# Patient Record
Sex: Female | Born: 1935 | Race: White | Hispanic: No | Smoking: Never smoker
Health system: Southern US, Community
[De-identification: ages and names within clinical notes are randomized; demographics above are authoritative.]

## PROBLEM LIST (undated history)

## (undated) DIAGNOSIS — I480 Paroxysmal atrial fibrillation: Secondary | ICD-10-CM

## (undated) DIAGNOSIS — I2510 Atherosclerotic heart disease of native coronary artery without angina pectoris: Secondary | ICD-10-CM

## (undated) DIAGNOSIS — E119 Type 2 diabetes mellitus without complications: Secondary | ICD-10-CM

## (undated) DIAGNOSIS — K219 Gastro-esophageal reflux disease without esophagitis: Secondary | ICD-10-CM

## (undated) DIAGNOSIS — E785 Hyperlipidemia, unspecified: Secondary | ICD-10-CM

## (undated) DIAGNOSIS — I441 Atrioventricular block, second degree: Secondary | ICD-10-CM

## (undated) DIAGNOSIS — I428 Other cardiomyopathies: Secondary | ICD-10-CM

## (undated) DIAGNOSIS — Z8701 Personal history of pneumonia (recurrent): Secondary | ICD-10-CM

## (undated) DIAGNOSIS — H3530 Unspecified macular degeneration: Secondary | ICD-10-CM

## (undated) DIAGNOSIS — N183 Chronic kidney disease, stage 3 unspecified: Secondary | ICD-10-CM

## (undated) DIAGNOSIS — I10 Essential (primary) hypertension: Secondary | ICD-10-CM

## (undated) HISTORY — DX: Chronic kidney disease, stage 3 unspecified: N18.30

## (undated) HISTORY — DX: Other cardiomyopathies: I42.8

## (undated) HISTORY — DX: Chronic kidney disease, stage 3 (moderate): N18.3

## (undated) HISTORY — PX: REPLACEMENT TOTAL KNEE: SUR1224

---

## 1968-09-13 HISTORY — PX: OTHER SURGICAL HISTORY: SHX169

## 1997-01-13 HISTORY — PX: CHOLECYSTECTOMY: SHX55

## 2001-08-02 ENCOUNTER — Ambulatory Visit (HOSPITAL_COMMUNITY): Admission: RE | Admit: 2001-08-02 | Discharge: 2001-08-02 | Payer: Self-pay | Admitting: Family Medicine

## 2001-08-02 ENCOUNTER — Encounter: Payer: Self-pay | Admitting: Family Medicine

## 2002-12-21 ENCOUNTER — Ambulatory Visit (HOSPITAL_COMMUNITY): Admission: RE | Admit: 2002-12-21 | Discharge: 2002-12-21 | Payer: Self-pay | Admitting: Family Medicine

## 2003-03-27 ENCOUNTER — Encounter: Payer: Self-pay | Admitting: Orthopedic Surgery

## 2003-05-05 ENCOUNTER — Encounter: Payer: Self-pay | Admitting: Orthopedic Surgery

## 2004-10-31 ENCOUNTER — Emergency Department (HOSPITAL_COMMUNITY): Admission: EM | Admit: 2004-10-31 | Discharge: 2004-10-31 | Payer: Self-pay | Admitting: Emergency Medicine

## 2004-11-07 ENCOUNTER — Ambulatory Visit (HOSPITAL_COMMUNITY): Admission: RE | Admit: 2004-11-07 | Discharge: 2004-11-07 | Payer: Self-pay | Admitting: *Deleted

## 2004-12-17 ENCOUNTER — Ambulatory Visit (HOSPITAL_COMMUNITY): Admission: RE | Admit: 2004-12-17 | Discharge: 2004-12-17 | Payer: Self-pay | Admitting: Family Medicine

## 2005-01-31 ENCOUNTER — Ambulatory Visit (HOSPITAL_COMMUNITY): Admission: RE | Admit: 2005-01-31 | Discharge: 2005-01-31 | Payer: Self-pay | Admitting: Family Medicine

## 2005-03-16 ENCOUNTER — Emergency Department (HOSPITAL_COMMUNITY): Admission: EM | Admit: 2005-03-16 | Discharge: 2005-03-16 | Payer: Self-pay | Admitting: Emergency Medicine

## 2006-03-03 ENCOUNTER — Ambulatory Visit (HOSPITAL_COMMUNITY): Admission: RE | Admit: 2006-03-03 | Discharge: 2006-03-03 | Payer: Self-pay | Admitting: Family Medicine

## 2006-03-10 ENCOUNTER — Ambulatory Visit (HOSPITAL_COMMUNITY): Admission: RE | Admit: 2006-03-10 | Discharge: 2006-03-10 | Payer: Self-pay | Admitting: Family Medicine

## 2007-05-11 ENCOUNTER — Ambulatory Visit (HOSPITAL_COMMUNITY): Admission: RE | Admit: 2007-05-11 | Discharge: 2007-05-11 | Payer: Self-pay | Admitting: Ophthalmology

## 2007-06-22 ENCOUNTER — Ambulatory Visit (HOSPITAL_COMMUNITY): Admission: RE | Admit: 2007-06-22 | Discharge: 2007-06-22 | Payer: Self-pay | Admitting: Ophthalmology

## 2007-10-26 ENCOUNTER — Ambulatory Visit (HOSPITAL_COMMUNITY): Admission: RE | Admit: 2007-10-26 | Discharge: 2007-10-26 | Payer: Self-pay | Admitting: Family Medicine

## 2007-10-29 ENCOUNTER — Ambulatory Visit (HOSPITAL_COMMUNITY): Admission: RE | Admit: 2007-10-29 | Discharge: 2007-10-29 | Payer: Self-pay | Admitting: Family Medicine

## 2008-04-27 ENCOUNTER — Ambulatory Visit (HOSPITAL_COMMUNITY): Admission: RE | Admit: 2008-04-27 | Discharge: 2008-04-27 | Payer: Self-pay | Admitting: Family Medicine

## 2008-04-27 ENCOUNTER — Encounter: Payer: Self-pay | Admitting: Orthopedic Surgery

## 2008-05-02 ENCOUNTER — Encounter: Payer: Self-pay | Admitting: Orthopedic Surgery

## 2008-05-02 ENCOUNTER — Emergency Department (HOSPITAL_COMMUNITY): Admission: EM | Admit: 2008-05-02 | Discharge: 2008-05-02 | Payer: Self-pay | Admitting: Emergency Medicine

## 2008-05-03 ENCOUNTER — Ambulatory Visit: Payer: Self-pay | Admitting: Orthopedic Surgery

## 2008-05-03 DIAGNOSIS — M25469 Effusion, unspecified knee: Secondary | ICD-10-CM | POA: Insufficient documentation

## 2008-05-03 DIAGNOSIS — M25569 Pain in unspecified knee: Secondary | ICD-10-CM | POA: Insufficient documentation

## 2008-05-03 DIAGNOSIS — E119 Type 2 diabetes mellitus without complications: Secondary | ICD-10-CM

## 2008-05-03 DIAGNOSIS — I10 Essential (primary) hypertension: Secondary | ICD-10-CM | POA: Insufficient documentation

## 2008-05-03 LAB — CONVERTED CEMR LAB
INR: 2.9 — ABNORMAL HIGH (ref 0.0–1.5)
Prothrombin Time: 32.4 s — ABNORMAL HIGH (ref 11.6–15.2)
aPTT: 57 s — ABNORMAL HIGH

## 2008-05-04 ENCOUNTER — Encounter: Payer: Self-pay | Admitting: Orthopedic Surgery

## 2008-05-10 ENCOUNTER — Ambulatory Visit: Payer: Self-pay | Admitting: Orthopedic Surgery

## 2008-05-10 DIAGNOSIS — M25069 Hemarthrosis, unspecified knee: Secondary | ICD-10-CM

## 2008-05-15 ENCOUNTER — Ambulatory Visit: Payer: Self-pay | Admitting: Orthopedic Surgery

## 2008-05-22 ENCOUNTER — Ambulatory Visit: Payer: Self-pay | Admitting: Orthopedic Surgery

## 2008-05-30 ENCOUNTER — Ambulatory Visit: Payer: Self-pay | Admitting: Orthopedic Surgery

## 2008-06-13 ENCOUNTER — Ambulatory Visit: Payer: Self-pay | Admitting: Orthopedic Surgery

## 2008-06-14 ENCOUNTER — Encounter: Payer: Self-pay | Admitting: Orthopedic Surgery

## 2008-06-15 ENCOUNTER — Ambulatory Visit: Payer: Self-pay | Admitting: Orthopedic Surgery

## 2008-06-21 ENCOUNTER — Ambulatory Visit: Payer: Self-pay | Admitting: Orthopedic Surgery

## 2008-06-30 ENCOUNTER — Ambulatory Visit: Payer: Self-pay | Admitting: Orthopedic Surgery

## 2008-06-30 ENCOUNTER — Ambulatory Visit (HOSPITAL_COMMUNITY): Admission: RE | Admit: 2008-06-30 | Discharge: 2008-06-30 | Payer: Self-pay | Admitting: Orthopedic Surgery

## 2008-07-03 ENCOUNTER — Telehealth: Payer: Self-pay | Admitting: Orthopedic Surgery

## 2008-07-04 ENCOUNTER — Ambulatory Visit: Payer: Self-pay | Admitting: Orthopedic Surgery

## 2008-07-04 DIAGNOSIS — M4800 Spinal stenosis, site unspecified: Secondary | ICD-10-CM

## 2008-07-05 ENCOUNTER — Encounter: Payer: Self-pay | Admitting: Orthopedic Surgery

## 2008-07-05 ENCOUNTER — Encounter (HOSPITAL_COMMUNITY): Admission: RE | Admit: 2008-07-05 | Discharge: 2008-08-07 | Payer: Self-pay | Admitting: Orthopedic Surgery

## 2008-07-25 ENCOUNTER — Encounter: Payer: Self-pay | Admitting: Orthopedic Surgery

## 2008-07-26 ENCOUNTER — Ambulatory Visit: Payer: Self-pay | Admitting: Orthopedic Surgery

## 2008-08-09 ENCOUNTER — Encounter (HOSPITAL_COMMUNITY): Admission: RE | Admit: 2008-08-09 | Discharge: 2008-09-08 | Payer: Self-pay | Admitting: Orthopedic Surgery

## 2008-08-16 ENCOUNTER — Encounter: Payer: Self-pay | Admitting: Orthopedic Surgery

## 2008-08-30 ENCOUNTER — Ambulatory Visit: Payer: Self-pay | Admitting: Orthopedic Surgery

## 2008-09-19 ENCOUNTER — Ambulatory Visit (HOSPITAL_COMMUNITY): Admission: RE | Admit: 2008-09-19 | Discharge: 2008-09-19 | Payer: Self-pay | Admitting: Family Medicine

## 2008-09-22 ENCOUNTER — Ambulatory Visit (HOSPITAL_COMMUNITY): Admission: RE | Admit: 2008-09-22 | Discharge: 2008-09-22 | Payer: Self-pay | Admitting: Family Medicine

## 2008-11-30 ENCOUNTER — Ambulatory Visit: Payer: Self-pay | Admitting: Orthopedic Surgery

## 2008-11-30 DIAGNOSIS — M1710 Unilateral primary osteoarthritis, unspecified knee: Secondary | ICD-10-CM

## 2008-12-27 ENCOUNTER — Ambulatory Visit: Payer: Self-pay | Admitting: Orthopedic Surgery

## 2009-02-19 ENCOUNTER — Ambulatory Visit: Payer: Self-pay | Admitting: Orthopedic Surgery

## 2009-02-19 DIAGNOSIS — G5790 Unspecified mononeuropathy of unspecified lower limb: Secondary | ICD-10-CM | POA: Insufficient documentation

## 2009-03-15 HISTORY — PX: NM MYOCAR PERF WALL MOTION: HXRAD629

## 2009-04-02 ENCOUNTER — Ambulatory Visit: Payer: Self-pay | Admitting: Orthopedic Surgery

## 2009-05-30 ENCOUNTER — Ambulatory Visit: Payer: Self-pay | Admitting: Orthopedic Surgery

## 2009-06-05 ENCOUNTER — Encounter (INDEPENDENT_AMBULATORY_CARE_PROVIDER_SITE_OTHER): Payer: Self-pay | Admitting: *Deleted

## 2009-06-06 ENCOUNTER — Encounter (INDEPENDENT_AMBULATORY_CARE_PROVIDER_SITE_OTHER): Payer: Self-pay | Admitting: *Deleted

## 2009-06-11 ENCOUNTER — Encounter: Payer: Self-pay | Admitting: Orthopedic Surgery

## 2009-06-12 ENCOUNTER — Inpatient Hospital Stay (HOSPITAL_COMMUNITY): Admission: RE | Admit: 2009-06-12 | Discharge: 2009-06-15 | Payer: Self-pay | Admitting: Orthopedic Surgery

## 2009-06-12 ENCOUNTER — Ambulatory Visit: Payer: Self-pay | Admitting: Orthopedic Surgery

## 2009-06-19 ENCOUNTER — Telehealth: Payer: Self-pay | Admitting: Orthopedic Surgery

## 2009-06-20 ENCOUNTER — Ambulatory Visit: Payer: Self-pay | Admitting: Orthopedic Surgery

## 2009-06-20 ENCOUNTER — Telehealth: Payer: Self-pay | Admitting: Orthopedic Surgery

## 2009-06-20 DIAGNOSIS — Z96659 Presence of unspecified artificial knee joint: Secondary | ICD-10-CM

## 2009-06-26 ENCOUNTER — Ambulatory Visit: Payer: Self-pay | Admitting: Orthopedic Surgery

## 2009-06-28 ENCOUNTER — Encounter: Payer: Self-pay | Admitting: Orthopedic Surgery

## 2009-07-11 ENCOUNTER — Ambulatory Visit: Payer: Self-pay | Admitting: Orthopedic Surgery

## 2009-07-18 ENCOUNTER — Encounter (HOSPITAL_COMMUNITY): Admission: RE | Admit: 2009-07-18 | Discharge: 2009-08-17 | Payer: Self-pay | Admitting: Orthopedic Surgery

## 2009-07-18 ENCOUNTER — Encounter: Payer: Self-pay | Admitting: Orthopedic Surgery

## 2009-08-10 ENCOUNTER — Encounter: Payer: Self-pay | Admitting: Orthopedic Surgery

## 2009-08-13 ENCOUNTER — Ambulatory Visit: Payer: Self-pay | Admitting: Orthopedic Surgery

## 2009-08-22 ENCOUNTER — Encounter: Payer: Self-pay | Admitting: Orthopedic Surgery

## 2009-09-26 ENCOUNTER — Ambulatory Visit: Payer: Self-pay | Admitting: Orthopedic Surgery

## 2009-12-12 ENCOUNTER — Encounter: Payer: Self-pay | Admitting: Orthopedic Surgery

## 2010-02-14 NOTE — Letter (Signed)
Summary: Letter of medical necessity  Letter of medical necessity   Imported By: Jacklynn Ganong 07/02/2009 12:55:24  _____________________________________________________________________  External Attachment:    Type:   Image     Comment:   External Document

## 2010-02-14 NOTE — Letter (Signed)
Summary: Generic Letter  [facility]  [address]  [place], [place] [address]   Phone: [phone]  Fax: [phone]    06/11/2009  Kathleen [name] [address] [place], [place]  [address]  Visit Type:  Follow-up Primary Provider:  Dr. Nobie Putnam  CC:  right knee pain.  History of Present Illness: I saw Kathleen [name] in the office today for a followup visit.  She is a 77 years old woman with the complaint of:  right knee pain with diagnosed OA RIGHT KNEE. She c/o severe pain and loss of function in her ADLS. Injections and aspirations have not alleviated her pain . She has also had oral narcotics for pain with no reliref. She has asked to have knee replacement as there are no other treatments to help her.      Current Medications (verified): 1)  Aspirin 325 Mg Tabs (Aspirin) 2)  Carvedilol 25 Mg Tabs (Carvedilol) .Marland Kitchen.. 1 Tab 2x Day 3)  Metformin Hcl 500 Mg Tabs (Metformin Hcl) 4)  Neurontin 100 Mg Caps (Gabapentin) .Marland Kitchen.. 1 By Mouth Three Times A Day  Allergies (verified): 1)  ! Sulfa 2)  ! Morphine  Past History:  Past Medical History: Last updated: 05/03/2008 Diabetes High blood pressure Coronary artery disease Arrythmia Baker's cyst Joint pain Cataracts  Past Surgical History: Last updated: 05/03/2008 Broken wrist (approx 2000) Gall stones/bladder stones (approx 1995) Cyst removal bladder (over 50 yrs ago)  Family History: Last updated: 05/03/2008 Family History of Diabetes  Social History: Last updated: 05/03/2008 Patient is widowed.   Risk Factors: Alcohol Use: 0 (05/03/2008) Caffeine Use: 1 (05/03/2008)  Risk Factors: Smoking Status: never (05/03/2008)  Review of Systems Musculoskeletal:  LEFT knee pain as well..  The review of systems is negative for Constitutional, Cardiovascular, Respiratory, Gastrointestinal, Genitourinary, Neurologic, Endocrine, Psychiatric, Skin, HEENT, Immunology, and  Hemoatologic.  Physical Exam  Additional Exam:  Constitutional: vital signs see recorded values. General: normal development, nutrition, and grooming. No deformity. Body Habitus is mesomorphic/ endomorph CDV: Observation and palpation was normal  Lymph: palpation of the lymph nodes were normal Skin: inspection and palpation of the skin revealed no abnormalities  Neuro: coordination: normal              DTR's normal              Sensation was normal  Psyche: Alert and oriented x 3. Mood was normal.  Affect: normal  MSK: Gait: abnormal  RIGHT knee has a mild flexion contracture.  She has approximately 115--120 of flexion.  Tenderness lateral medial compartment.  Strength normal.  Knee stable.  LEFT knee, also mild flexion contracture flexion arc the same no tenderness strength normal knee is stable    The upper extremities have normal appearance, ROM, strength and stability.     Impression & Recommendations:  Problem # 1:  KNEE, ARTHRITIS, DEGEN./OSTEO (ICD-715.96) Assessment Deteriorated  previous x-rays show that there is a severe amount of periarticular spurs and patellofemoral bone the bone with lateral and medial compartment arthrosis  Minimal deformity  Rec RIGHT total knee arthroplasty with a Depuy implant  Orders: Est. Patient Level IV (62952)  Patient Instructions: 1)  RT TKA  2)  May 31st  3)  Informed consent process: I have discussed the procedure with the patient. I have answered their questions. The risks of bleeding, infection, nerve and vascualr injury have been discussed. The diagnosis and reason for surgery have been explained. The patient demonstrates understanding  of this discussion. Specific to this procedure risks include:  4)  stiffness 5)  pain 6)  clots/embolus 7)  infection/can lead to amputation         Sincerely,   Fuller Canada MD

## 2010-02-14 NOTE — Assessment & Plan Note (Signed)
Summary: 6 WK RE-CK RT KNEE/POST OP TKA 06/12/09/SEC [facility]OR[facility]/CAF   Visit Type:  Follow-up Primary Provider:  Dr. Nobie Putnam  CC:  catching right knee .  History of Present Illness: s/p knee replacement 06/12/2009  c/o difficulty getting up out of a chair   ROS: c/o catching   knee exam : all ligaments were stable  I detected no catching or laxity  There was no tenderness, no effusion QUADRICEPS 4/5 STRENGTH ROM = 120 DEGREES  INCISION HEALED NON TENDER DP PULSE NORMAL  SENSATION NORMAL   The  alignment was normal, PTF reduced without tilt or subluxation, no evidence of loosening.  IMPRESSION: normal appearance of the implant      Allergies: 1)  ! Sulfa 2)  ! Morphine   Impression & Recommendations:  Problem # 1:  TOTAL KNEE FOLLOW-UP (ICD-V43.65) Assessment Comment Only  No cause of catching seen ; continue to follow   Start Quad exercises   Orders: Est. Patient Level III (04540) Knee x-ray,  3 views (98119)  Patient Instructions: 1)  THE XRAYS WERE FINE AND THE EXAM WAS FINE. I CAN'T TELL RIGHT NOW WHY YOUR FEELING ANY CATCHING. I EXPECT IT TO RESOLVE 2)  FOR THE SWELLING IN THE ANKLE : ELEVATE THE RIGHT LEG AND WEAR THE STOCKING WHEN YOU ARE EXPERIENCING SWELLING. 3)  I HAVE GIVEN YOU SOME KNEE EXERCISES TO HELP STRNGTHEN YOUR THIGH WHICH WILL HELP WHEN YOU GET OUT OF A SEATED POSITION  4)  RETURN IN 6 MONTHS  5)  XRAYS WILL BE DONE THEN

## 2010-02-14 NOTE — Miscellaneous (Signed)
Summary: [facility] progress note  [facility] progress note   Imported By: Jacklynn Ganong 07/03/2009 10:19:43  _____________________________________________________________________  External Attachment:    Type:   Image     Comment:   External Document

## 2010-02-14 NOTE — Miscellaneous (Signed)
Summary: PT progress note  PT progress note   Imported By: Jacklynn Ganong 08/13/2009 15:45:37  _____________________________________________________________________  External Attachment:    Type:   Image     Comment:   External Document

## 2010-02-14 NOTE — Assessment & Plan Note (Signed)
Summary: 2 WK RE-CK SKIN/POST OP TKA/SURG 06/12/09/[facility]/CAF   Visit Type:  Follow-up Primary Provider:  Dr. Nobie Putnam  CC:  post op tka.  History of Present Illness: I saw Kathleen [name] in the office today for a followup visit.  She is a 77 years old woman with the complaint of:  post op right TKA.  DOS 06/12/09   POD 28  Norco 10 for pain., still helping, takes every once in a while.  Still taking 2 ASA per day.  Robaxin as needed.  Therapy is going well  Today is 2 week recheck blisters on right leg.  She has red and tenderness below anterior blister on leg.  Gradually getting better with the blisters.  Has good ROM with knee.      Allergies: 1)  ! Sulfa 2)  ! Morphine  Physical Exam  Additional Exam:  the RIGHT leg has some swelling distal to the previously noted blister.  Her range of motion is improved to about 105 she has excellent extension and good extension power.  She is now ambulatory without a cane.     Impression & Recommendations:  Problem # 1:  TOTAL KNEE FOLLOW-UP (ICD-V43.65) Assessment Improved  she is ready for some outpatient physical therapy. I see her in a month. No x-rays are needed  Orders: Physical Therapy Referral (PT) Post-Op Check (16109)  Patient Instructions: 1)  Use knee high hose 2)  start therapy at the hospital 3)  come back in a month

## 2010-02-14 NOTE — Assessment & Plan Note (Signed)
Summary: 1 M RE-CK RT KNEE/POST OP TKA 06/12/09/[facility]IZ/CAF   Visit Type:  Follow-up Primary Provider:  Dr. Nobie Putnam  CC:  post op TKA.  History of Present Illness: I saw Kathleen [name] in the office today for a followup visit.  She is a 77 years old woman with the complaint of:  post op right TKA.  DOS 06/12/09   Doing well, she is driving herself today. DC from PT [facility] 08/10/09  She complains of some ankle swelling at the end of the day and also some stiffness at the end of the day.  Otherwise no complaints    Allergies: 1)  ! Sulfa 2)  ! Morphine  Physical Exam  Extremities:  RIGHT knee no joint effusion.  Patient ambulates with a reasonable gait.  No tenderness.  Range of motion is 0-115.  Muscle tone is good quadriceps bulk is good extension is good her knee is stable   Impression & Recommendations:  Problem # 1:  TOTAL KNEE FOLLOW-UP (ICD-V43.65) Assessment Improved  Orders: Post-Op Check (25956)  Patient Instructions: 1)  f/u in 6 weeks  2)  exercise the knee at home

## 2010-02-14 NOTE — Assessment & Plan Note (Signed)
Summary: rt knee hurting again/[facility]/bsf   Primary Provider:  Dr. Nobie Putnam   History of Present Illness: Kathleen [name] is a 77 year old female presents now with followup visit for RIGHT knee pain times a week previous injections in December 2010 with aspiration.  She has osteoarthritis on x-ray.  She complains of RIGHT lower extremity pain primarily back and side of her leg.  She is not complain of pain in the front of the knee.  She has pain when she stands long periods of time and is always in the lower leg below the knee.  She takes Tylenol extra strength for pain and Vicodin which helps she takes [place] as needed  She think she has some knee swelling she is not sure.  The leg give way at times.  Review of systems she does have some back pain her daughter says she is always holding her back but says it doesn't hurt.  She denies excessive weight loss weight gain access to back pain at night or bowel or bladder dysfunction    Allergies: 1)  ! Sulfa 2)  ! Morphine  Past History:  Past Medical History: Last updated: 05/03/2008 Diabetes High blood pressure Coronary artery disease Arrythmia Baker's cyst Joint pain Cataracts  Past Surgical History: Last updated: 05/03/2008 Broken wrist (approx 2000) Gall stones/bladder stones (approx 1995) Cyst removal bladder (over 50 yrs ago)  Family History: Last updated: 05/03/2008 Family History of Diabetes  Social History: Last updated: 05/03/2008 Patient is widowed.   Risk Factors: Alcohol Use: 0 (05/03/2008) Caffeine Use: 1 (05/03/2008)  Risk Factors: Smoking Status: never (05/03/2008)  Physical Exam  Additional Exam:  Is a medium to large size female normal development grooming and hygiene normal pulse mild varicosities normal temperature in the lower legs  Her gait is slowed.  Her stride length is diminished.  She appears to limp a little bit.  The knee actually looks pretty good there is no joint effusion the soft  tissues are swollen in both knees but no joint effusion is crepitance on range of motion a slight deformity.  There is mild contracture and flexion.  She has no pain with range of motion of the RIGHT knee both knees are stable muscle strength and tone in both knees are normal skin is intact in both knees  His normal reflexes she joined a x3 mood and affect are normal straight leg raise was negative there were no pathologic reflexes     Impression & Recommendations:  Problem # 1:  KNEE, ARTHRITIS, DEGEN./OSTEO (ICD-715.96)  because we know she has arthritis and this is helped in the past and had a repeat of her RIGHT knee injection  Verbal consent was obtained. The knee was prepped with alcohol and ethyl chloride. 1 cc of depomedrol 40mg /cc and 4 cc of lidocaine 1% was injected. there were no complications.  Her updated medication list for this problem includes:    Aspirin 325 Mg Tabs (Aspirin)  Orders: Est. Patient Level IV (04540) Joint Aspirate / Injection, Large (20610) Depo- Medrol 40mg  (J1030)  Problem # 2:  MONONEURITIS, LEG (ICD-355.8)  possible radicular pain or referred pain to the RIGHT leg  Orders: Est. Patient Level IV (98119)  Medications Added to Medication List This Visit: 1)  Neurontin 100 Mg Caps (Gabapentin) .Marland Kitchen.. 1 by mouth three times a day  Patient Instructions: 1)  Please schedule a follow-up appointment in 6 weeks Prescriptions: NEURONTIN 100 MG CAPS (GABAPENTIN) 1 by mouth three times a day  #60 x 1  Entered and Authorized by:   Fuller Canada MD   Signed by:   Fuller Canada MD on 02/19/2009   Method used:   Print then Give to Patient   RxID:   [id number]

## 2010-02-14 NOTE — Assessment & Plan Note (Signed)
Summary: 6 WK RE-CK RT KNEE/SEC HORIZ/CAF   Visit Type:  Follow-up Primary Provider:  Dr. Nobie Putnam  CC:  recheck rt knee.  History of Present Illness: 77 years old history of osteoarthritis RIGHT knee and history of some radicular-like symptoms in the RIGHT leg  We did give her an injection the RIGHT knee didn't help, Neurontin didn't help.  He has pain after standing for long periods of time and when she bends the RIGHT knee  Had doppler of bilateral legs, said circulation is fine, has baker's cyst right knee.  Today, scheduled for: 6 week recheck right knee after injection and treatment with Neurontin  I discussed this with her and her daughter for approximately 10 minutes.  They're not ready to have knee replacement surgery at this time.  She asked about arthroscopic surgery and since there are no meniscal signs I recommended against this.  When she wants to have knee replacement surgery she will call us back.    Allergies: 1)  ! Sulfa 2)  ! Morphine   Impression & Recommendations:  Problem # 1:  MONONEURITIS, LEG (ICD-355.8) Assessment Unchanged  Orders: Est. Patient Level II (14782)  Problem # 2:  KNEE, ARTHRITIS, DEGEN./OSTEO (ICD-715.96) Assessment: Unchanged  Her updated medication list for this problem includes:    Aspirin 325 Mg Tabs (Aspirin)  Orders: Est. Patient Level II (95621)  Patient Instructions: 1)  Call us when ready for knee replacement

## 2010-02-14 NOTE — Progress Notes (Signed)
Summary: patient call back fol'g offering appointment   Phone Note Call from Patient   Caller: Daughter Summary of Call: I called back, as patient's daughter had left a message, re-checking if Rx was prescribed (per previous note re: "blisters").  I offered appointment today. Patient and daughter both said "it would be a challenge to get her into office".  Asked again if Rx can be prescribed. Please advise. ( Daughter notes physical therapist has just come in to do therapy also). Initial call taken by: Cammie Sickle,  June 20, 2009 11:15 AM

## 2010-02-14 NOTE — Assessment & Plan Note (Signed)
Summary: POST OP 1/TKA RT/SURG 06/12/09/SEC [facility]ORIZ/CAF   Visit Type:  Follow-up Primary Provider:  Dr. Nobie Putnam  CC:  post op 2 knee tka.  History of Present Illness: I saw Kathleen [name] in the office today for a followup visit.  She is a 77 years old woman with the complaint of:  post op right TKA.  DOS 06/12/09   POD 15  Norco 10 for pain., still helping.  Robaxin as needed.  Staples out today.  Therapy is going well  Her listers have all dried up and are healed nicely with no evidence of skin breakdown  She can continue physical therapy and progress as tolerated       Allergies: 1)  ! Sulfa 2)  ! Morphine   Impression & Recommendations:  Problem # 1:  TOTAL KNEE FOLLOW-UP (ICD-V43.65) Assessment Improved  Orders: Post-Op Check (16109)  Patient Instructions: 1)  check skin in 2 weeks  2)  continue Phys Therapy

## 2010-02-14 NOTE — Letter (Signed)
Summary: [facility] medical record request  [employer] medical record request   Imported By: Jacklynn Ganong 12/13/2009 11:36:44  _____________________________________________________________________  External Attachment:    Type:   Image     Comment:   External Document

## 2010-02-14 NOTE — Letter (Signed)
Summary: Medical Record Request  Medical Record Request   Imported By: Jacklynn Ganong 12/12/2009 17:03:10  _____________________________________________________________________  External Attachment:    Type:   Image     Comment:   External Document

## 2010-02-14 NOTE — Miscellaneous (Signed)
Summary: Pre-auth for in-patient surgery  Clinical Lists Changes   Contacted insurer [facility] [employer] [place] re: in-patient surgery scheduled at [facility], 06/12/09.  CPT [id number], [id number]. Per Lafonda Mosses,  REF# [id number].  Pre-auth/nurse reviewer will contact us if clinicals are needed.  As of 06/08/09, no further request rec'd for clinicals.  Pre-auth per above.

## 2010-02-14 NOTE — Letter (Signed)
Summary: surgery order  surgery order   Imported By: Cammie Sickle 06/01/2009 10:22:01  _____________________________________________________________________  External Attachment:    Type:   Image     Comment:   External Document

## 2010-02-14 NOTE — Miscellaneous (Signed)
Summary: Physician's order for walker  Physician's order for walker   Imported By: Jacklynn Ganong 06/28/2009 13:43:03  _____________________________________________________________________  External Attachment:    Type:   Image     Comment:   External Document

## 2010-02-14 NOTE — Progress Notes (Signed)
Summary: call from patient's daughter + note Home health nurse  Phone Note Call from Patient   Caller: Daughter Action Taken: Patient advised to call 911 Summary of Call: Patient's daughter Lourdes Sledge (same ph as patient [phone]) called to relay that home health nurse was due at 10:30am and as of 10:50am had not arrived.  States has concerns about patient's skin near surgical site. By 10:55am nurse from [facility] arrived.  Advised hod for report/findings per nurse. Initial call taken by: Cammie Sickle,  June 19, 2009 11:06 AM  Follow-up for Phone Call        Home Health nurse Leslye (ph # [phone], cell ph#) called back fol'g visit w/patient. States incision looks great but notes several small to med sized fluid filled blisters around the knee + on shin below incision, there is a large open blister that is draining. States it was this way upon discharge from hosp 06/15/09.  Nurse has cleaned,wrapped; states some warmth and some swelling to it. Please advise if appointment or if medication to be prescribed. Pharmacy is [facility] in [place].  Nurse to fol up w/patient Fri 6/10, pt's sched appt is 06/26/09.  (our nurse is out of office at this time). Follow-up by: Cammie Sickle,  June 19, 2009 11:50 AM

## 2010-02-14 NOTE — Miscellaneous (Signed)
Summary: Home Health plan of care  Home Health plan of care   Imported By: Jacklynn Ganong 07/02/2009 12:54:30  _____________________________________________________________________  External Attachment:    Type:   Image     Comment:   External Document

## 2010-02-14 NOTE — Miscellaneous (Signed)
Summary: faxed [facility] and mm for surgery  Clinical Lists Changes

## 2010-02-14 NOTE — Miscellaneous (Signed)
Summary: PT clinical evaluation  PT clinical evaluation   Imported By: Jacklynn Ganong 07/25/2009 11:54:37  _____________________________________________________________________  External Attachment:    Type:   Image     Comment:   External Document

## 2010-02-14 NOTE — Assessment & Plan Note (Signed)
Summary: POST OP/WOUND CHECK/TKA SURG 06/12/09/SEC [facility]ORIZ/CAF   Visit Type:  Follow-up Primary Provider:  Dr. Nobie Putnam  CC:  post op check wound knee tka.  History of Present Illness: I saw Kathleen [name] in the office today for a followup visit.  She is a 77 years old woman with the complaint of:  post op right TKA.  DOS 06/12/09   POD 9.  she came in today for a checkup because she's got a lot of blisters on her leg.  She had some in the hospital there appeared to worsen and so we told her to come in to get it checked.  She's currently on Norco 10 mg as needed and she is in her CPM machine which is up to 85.     Allergies: 1)  ! Sulfa 2)  ! Morphine  Physical Exam  Additional Exam:  staple line still intact  Multiple blisters are seen and there opened up  Address thoroughly and we will see her for staple removal.   Impression & Recommendations:  Problem # 1:  KNEE, ARTHRITIS, DEGEN./OSTEO (ICD-715.96) Assessment Comment Only  Her updated medication list for this problem includes:    Aspirin 325 Mg Tabs (Aspirin)  Orders: Post-Op Check (16109)  Problem # 2:  TOTAL KNEE FOLLOW-UP (ICD-V43.65) Assessment: Comment Only  Orders: Post-Op Check (60454)  Patient Instructions: 1)  Change once a day  2)  continue therapy  3)  and return as scheduled

## 2010-02-14 NOTE — Assessment & Plan Note (Signed)
Summary: DISCUSS,SCHEDULE  KNEE SURGERY/SEC HOR/BSF   Visit Type:  Follow-up Primary Provider:  Dr. Nobie Putnam  CC:  right knee pain.  History of Present Illness: I saw Kathleen [name] in the office today for a followup visit.  She is a 77 years old woman with the complaint of:  right knee OA.  Here to discuss total knee replacement.  Has had injections and aspirations of the right knee.  History and physical to be completed at a later date but the patient has decided to go ahead and have a RIGHT total knee arthroplasty we discussed the risk and benefits of the procedure including possible complications and their treatments.  Her daughter was present .    Current Medications (verified): 1)  Aspirin 325 Mg Tabs (Aspirin) 2)  Carvedilol 25 Mg Tabs (Carvedilol) .Marland Kitchen.. 1 Tab 2x Day 3)  Metformin Hcl 500 Mg Tabs (Metformin Hcl) 4)  Neurontin 100 Mg Caps (Gabapentin) .Marland Kitchen.. 1 By Mouth Three Times A Day  Allergies (verified): 1)  ! Sulfa 2)  ! Morphine  Past History:  Past Medical History: Last updated: 05/03/2008 Diabetes High blood pressure Coronary artery disease Arrythmia Baker's cyst Joint pain Cataracts  Past Surgical History: Last updated: 05/03/2008 Broken wrist (approx 2000) Gall stones/bladder stones (approx 1995) Cyst removal bladder (over 50 yrs ago)  Family History: Last updated: 05/03/2008 Family History of Diabetes  Social History: Last updated: 05/03/2008 Patient is widowed.   Risk Factors: Alcohol Use: 0 (05/03/2008) Caffeine Use: 1 (05/03/2008)  Risk Factors: Smoking Status: never (05/03/2008)  Review of Systems Musculoskeletal:  LEFT knee pain as well..  The review of systems is negative for Constitutional, Cardiovascular, Respiratory, Gastrointestinal, Genitourinary, Neurologic, Endocrine, Psychiatric, Skin, HEENT, Immunology, and Hemoatologic.  Physical Exam  Additional Exam:  Constitutional: vital signs see recorded values. General: normal  development, nutrition, and grooming. No deformity. Body Habitus is mesomorphic/ endomorph CDV: Observation and palpation was normal  Lymph: palpation of the lymph nodes were normal Skin: inspection and palpation of the skin revealed no abnormalities  Neuro: coordination: normal              DTR's normal              Sensation was normal  Psyche: Alert and oriented x 3. Mood was normal.  Affect: normal  MSK: Gait: abnormal  RIGHT knee has a mild flexion contracture.  She has approximately 115--120 of flexion.  Tenderness lateral medial compartment.  Strength normal.  Knee stable.  LEFT knee, also mild flexion contracture flexion arc the same no tenderness strength normal knee is stable    The upper extremities have normal appearance, ROM, strength and stability.     Impression & Recommendations:  Problem # 1:  KNEE, ARTHRITIS, DEGEN./OSTEO (ICD-715.96) Assessment Deteriorated  previous x-rays show that there is a severe amount of periarticular spurs and patellofemoral bone the bone with lateral and medial compartment arthrosis  Minimal deformity  Rec RIGHT total knee arthroplasty with a Depuy implant  Orders: Est. Patient Level IV (16109)  Patient Instructions: 1)  RT TKA  2)  May 31st  3)  Informed consent process: I have discussed the procedure with the patient. I have answered their questions. The risks of bleeding, infection, nerve and vascualr injury have been discussed. The diagnosis and reason for surgery have been explained. The patient demonstrates understanding of this discussion. Specific to this procedure risks include:  4)  stiffness 5)  pain 6)  clots/embolus 7)  infection/can lead to amputation

## 2010-04-01 LAB — CROSSMATCH
ABO/RH(D): A NEG
Antibody Screen: NEGATIVE

## 2010-04-01 LAB — BASIC METABOLIC PANEL
BUN: 10 mg/dL (ref 6–23)
BUN: 11 mg/dL (ref 6–23)
BUN: 11 mg/dL (ref 6–23)
CO2: 27 mEq/L (ref 19–32)
CO2: 28 mEq/L (ref 19–32)
Calcium: 8.3 mg/dL — ABNORMAL LOW (ref 8.4–10.5)
Calcium: 8.5 mg/dL (ref 8.4–10.5)
Calcium: 8.8 mg/dL (ref 8.4–10.5)
Calcium: 9.5 mg/dL (ref 8.4–10.5)
Chloride: 100 mEq/L (ref 96–112)
Chloride: 94 mEq/L — ABNORMAL LOW (ref 96–112)
Creatinine, Ser: 0.66 mg/dL (ref 0.4–1.2)
Creatinine, Ser: 0.68 mg/dL (ref 0.4–1.2)
Creatinine, Ser: 0.88 mg/dL (ref 0.4–1.2)
Creatinine, Ser: 0.83 mg/dL (ref 0.4–1.2)
GFR calc Af Amer: >60 mL/min (ref >60)
GFR calc Af Amer: >60 mL/min (ref >60)
GFR calc Af Amer: >60 mL/min (ref >60)
GFR calc non Af Amer: >60 mL/min (ref >60)
GFR calc non Af Amer: >60 mL/min (ref >60)
GFR calc non Af Amer: >60 mL/min (ref >60)
Glucose, Bld: 182 mg/dL — ABNORMAL HIGH (ref 70–99)
Glucose, Bld: 205 mg/dL — ABNORMAL HIGH (ref 70–99)
Potassium: 4.1 mEq/L (ref 3.5–5.1)
Potassium: 4.3 mEq/L (ref 3.5–5.1)
Potassium: 4.7 mEq/L (ref 3.5–5.1)
Sodium: 127 mEq/L — ABNORMAL LOW (ref 135–145)
Sodium: 133 mEq/L — ABNORMAL LOW (ref 135–145)

## 2010-04-01 LAB — CBC
HCT: 27.6 % — ABNORMAL LOW (ref 36.0–46.0)
HCT: 35.5 % — ABNORMAL LOW (ref 36.0–46.0)
Hemoglobin: 9.4 g/dL — ABNORMAL LOW (ref 12.0–15.0)
MCHC: 34.2 g/dL (ref 30.0–36.0)
MCHC: 34.7 g/dL (ref 30.0–36.0)
MCV: 97.8 fL (ref 78.0–100.0)
Platelets: 193 10*3/uL (ref 150–400)
Platelets: 207 10*3/uL (ref 150–400)
Platelets: 277 10*3/uL (ref 150–400)
RBC: 2.35 MIL/uL — ABNORMAL LOW (ref 3.87–5.11)
RBC: 2.83 MIL/uL — ABNORMAL LOW (ref 3.87–5.11)
RDW: 13 % (ref 11.5–15.5)
RDW: 13.3 % (ref 11.5–15.5)
WBC: 11.2 10*3/uL — ABNORMAL HIGH (ref 4.0–10.5)
WBC: 12.7 10*3/uL — ABNORMAL HIGH (ref 4.0–10.5)
WBC: 9.1 10*3/uL (ref 4.0–10.5)

## 2010-04-01 LAB — PROTIME-INR
INR: 1.34 (ref 0.00–1.49)
INR: 1.1 (ref 0.00–1.49)
Prothrombin Time: 16.5 seconds — ABNORMAL HIGH (ref 11.6–15.2)
Prothrombin Time: 14.1 seconds (ref 11.6–15.2)

## 2010-04-01 LAB — DIFFERENTIAL
Basophils Absolute: 0 10*3/uL (ref 0.0–0.1)
Basophils Absolute: 0.1 10*3/uL (ref 0.0–0.1)
Basophils Relative: 0 % (ref 0–1)
Basophils Relative: 0 % (ref 0–1)
Eosinophils Absolute: 0.1 10*3/uL (ref 0.0–0.7)
Eosinophils Relative: 1 % (ref 0–5)
Lymphocytes Relative: 12 % (ref 12–46)
Lymphocytes Relative: 6 % — ABNORMAL LOW (ref 12–46)
Lymphocytes Relative: 25 % (ref 12–46)
Lymphs Abs: 1.3 10*3/uL (ref 0.7–4.0)
Lymphs Abs: 1.4 10*3/uL (ref 0.7–4.0)
Lymphs Abs: 2.3 10*3/uL (ref 0.7–4.0)
Monocytes Absolute: 0.8 10*3/uL (ref 0.1–1.0)
Monocytes Relative: 7 % (ref 3–12)
Monocytes Relative: 7 % (ref 3–12)
Neutro Abs: 10.4 10*3/uL — ABNORMAL HIGH (ref 1.7–7.7)
Neutro Abs: 11.4 10*3/uL — ABNORMAL HIGH (ref 1.7–7.7)
Neutro Abs: 9 10*3/uL — ABNORMAL HIGH (ref 1.7–7.7)
Neutro Abs: 6.2 10*3/uL (ref 1.7–7.7)
Neutrophils Relative %: 80 % — ABNORMAL HIGH (ref 43–77)
Neutrophils Relative %: 82 % — ABNORMAL HIGH (ref 43–77)
Neutrophils Relative %: 68 % (ref 43–77)

## 2010-04-01 LAB — GLUCOSE, CAPILLARY
Glucose-Capillary: 147 mg/dL — ABNORMAL HIGH (ref 70–99)
Glucose-Capillary: 176 mg/dL — ABNORMAL HIGH (ref 70–99)
Glucose-Capillary: 194 mg/dL — ABNORMAL HIGH (ref 70–99)
Glucose-Capillary: 197 mg/dL — ABNORMAL HIGH (ref 70–99)
Glucose-Capillary: 244 mg/dL — ABNORMAL HIGH (ref 70–99)
Glucose-Capillary: 133 mg/dL — ABNORMAL HIGH (ref 70–99)
Glucose-Capillary: 137 mg/dL — ABNORMAL HIGH (ref 70–99)
Glucose-Capillary: 147 mg/dL — ABNORMAL HIGH (ref 70–99)

## 2010-04-01 LAB — APTT: aPTT: 24 seconds (ref 24–37)

## 2010-04-01 LAB — ABO/RH: ABO/RH(D): A NEG

## 2010-04-22 LAB — BASIC METABOLIC PANEL
Calcium: 9.3 mg/dL (ref 8.4–10.5)
Creatinine, Ser: 0.83 mg/dL (ref 0.4–1.2)
GFR calc Af Amer: >60 mL/min (ref >60)
GFR calc non Af Amer: >60 mL/min (ref >60)
Glucose, Bld: 180 mg/dL — ABNORMAL HIGH (ref 70–99)
Sodium: 133 mEq/L — ABNORMAL LOW (ref 135–145)

## 2010-04-22 LAB — PROTIME-INR
INR: 1 (ref 0.00–1.49)
Prothrombin Time: 13.3 seconds (ref 11.6–15.2)

## 2010-04-22 LAB — CBC
Hemoglobin: 11.6 g/dL — ABNORMAL LOW (ref 12.0–15.0)
RBC: 3.31 MIL/uL — ABNORMAL LOW (ref 3.87–5.11)
RDW: 14.1 % (ref 11.5–15.5)

## 2010-05-28 NOTE — Op Note (Signed)
Kathleen [name], Kathleen [name]             ACCOUNT NO.:  [id number]   MEDICAL RECORD NO.:  [id number]          PATIENT TYPE:  AMB   LOCATION:  DAY                           FACILITY:  [facility]   PHYSICIAN:  Vickki Hearing, M.D.DATE OF BIRTH:  March 27, 1924   DATE OF PROCEDURE:  06/30/2008  DATE OF DISCHARGE:  06/30/2008                               OPERATIVE REPORT   HISTORY:  This is an 77 year old female, who was over anticoagulated on  Coumadin and had frequent knee effusions and had multiple aspirations  and two cortisone injections, but continued to have effusions and we re-  aspirated the knee and got back old clotted blood.  She had radiographs  which showed mild arthritis of the knee.  She is now off the Coumadin,  but still had the chronic effusions.   We opted to go ahead and perform arthroscopic synovectomy.  She agreed,  understanding risks and benefits.  She was always with her daughter when  she presented for evaluation.   PREOPERATIVE DIAGNOSIS:  Hemarthrosis recurrent and refractory, left  knee.   POSTOPERATIVE DIAGNOSIS:  Hemarthrosis recurrent and refractory, left  knee.   PROCEDURE:  Arthroscopic synovectomy, extensive, left knee.   SURGEON:  Vickki Hearing, MD   ASSISTANTS:  None.   ANESTHESIA:  Spinal.   FINDINGS:  Extensive synovitis and arthritis, left knee.   SPECIMENS:  None.   ESTIMATED BLOOD LOSS:  Minimal.   COMPLICATIONS:  None.   The patient went to PACU in good condition.   DETAILS:  We marked her left knee as the surgical site and I  countersigned and updated her history and physical.  She was given preop  antibiotic Ancef 1 g, went to surgery suite and had spinal anesthetic  after successful anesthetic.  She was placed supine.  Left leg was  placed in a well-leg holder and was prepped with chlorhexidine solution,  draped sterilely.   Time-out procedure was initiated and completed.   Lateral portal was established.  Diagnostic  arthroscopy was performed.  Medial portal was established and using various instruments, complete  synovectomy was performed.  All compartments were involved.  She had  mild grade 2 changes on the medial femoral condyle, degenerative fraying  of medial and lateral menisci, large inferior patellar spur which was  resected.   Knee was irrigated and closed with 3-0 nylon suture.  We injected a  total of 60 mL of Marcaine, 15 mL in the beginning and 45 mL at the end.  Knee was dressed sterilely, Cryo/Cuff was applied.  The patient was  taken to recovery room in stable condition.  She is full weightbearing  with a walker.  She can use hydrocodone for pain.  She will take Ecotrin  325 mg p.o. b.i.d. for #21 days.  She will get a dose of Lovenox in the  PACU 30 mg subcu x1.  Hydrocodone 5 mg q.4 p.r.n. for pain #60, no  refills.  Physical therapy will be scheduled to start on Wednesday.  Her  followup will be on Tuesday.      Vickki Hearing, M.D.  Electronically Signed     SEH/MEDQ  D:  06/30/2008  T:  07/01/2008  Job:  [id number]

## 2010-05-31 NOTE — Procedures (Signed)
NAMECLOMA, RAHRIG             ACCOUNT NO.:  [id number]   MEDICAL RECORD NO.:  [id number]          PATIENT TYPE:  EMS   LOCATION:  ED                            FACILITY:  [facility]   PHYSICIAN:  Edward L. Juanetta Gosling, M.D.DATE OF BIRTH:  January 02, 1925   DATE OF PROCEDURE:  10/31/2004  DATE OF DISCHARGE:  10/31/2004                                EKG INTERPRETATION   EKG NUMBER:  [id number].   The rhythm is supraventricular tachycardia. The rate is about 160. It is  somewhat irregular and I believe it is probably atrial fibrillation with a  rapid ventricular response. There are diffuse ST-T wave abnormalities.   Same patient 1705, October 31, 2004. The rhythm is now sinus rhythm with a  rate in the 90s. There is early transition across precordium. The R-wave in  V3 is smaller than the R-waves in V2, which may indicate a previous anterior  infarction and clinical correlation is suggested. There are diffuse ST-T  wave abnormalities. Abnormal electrocardiogram.   Same patient 1741, October 31, 2004. The rhythm is sinus rhythm at a rate  70. Note that this is done at double speed. There is early transition to QRS  positivity and there are nonspecific T-wave abnormalities. Abnormal  electrocardiogram.      Oneal Deputy. Juanetta Gosling, M.D.  Electronically Signed     ELH/MEDQ  D:  11/02/2004  T:  11/04/2004  Job:  [id number]

## 2010-09-05 ENCOUNTER — Other Ambulatory Visit: Payer: Self-pay | Admitting: Orthopedic Surgery

## 2010-09-09 ENCOUNTER — Other Ambulatory Visit: Payer: Self-pay | Admitting: *Deleted

## 2010-09-09 DIAGNOSIS — R52 Pain, unspecified: Secondary | ICD-10-CM

## 2010-09-09 MED ORDER — HYDROCODONE-ACETAMINOPHEN 5-325 MG PO TABS: 1.0000 | ORAL_TABLET | ORAL | Status: AC | PRN

## 2010-10-08 LAB — HEMOGLOBIN AND HEMATOCRIT, BLOOD
HCT: 35.5 — ABNORMAL LOW
Hemoglobin: 12.7

## 2010-10-08 LAB — BASIC METABOLIC PANEL
BUN: 12
Chloride: 102
Creatinine, Ser: 0.8
GFR calc Af Amer: >60
GFR calc non Af Amer: >60

## 2010-10-10 LAB — BASIC METABOLIC PANEL
BUN: 13
Calcium: 9.5
Creatinine, Ser: 0.94
GFR calc non Af Amer: 57 — ABNORMAL LOW
Glucose, Bld: 250 — ABNORMAL HIGH
Potassium: 3.8

## 2010-10-10 LAB — HEMOGLOBIN AND HEMATOCRIT, BLOOD: Hemoglobin: 12.6

## 2010-12-02 ENCOUNTER — Other Ambulatory Visit (HOSPITAL_COMMUNITY): Payer: Self-pay | Admitting: Internal Medicine

## 2010-12-02 DIAGNOSIS — Z139 Encounter for screening, unspecified: Secondary | ICD-10-CM

## 2010-12-06 ENCOUNTER — Other Ambulatory Visit (HOSPITAL_COMMUNITY): Payer: Self-pay

## 2010-12-10 ENCOUNTER — Ambulatory Visit (HOSPITAL_COMMUNITY)
Admission: RE | Admit: 2010-12-10 | Discharge: 2010-12-10 | Disposition: A | Discharge status: Home / Self Care | Payer: Medicare Other | Source: Ambulatory Visit | Attending: Internal Medicine | Admitting: Internal Medicine

## 2010-12-10 DIAGNOSIS — Z13820 Encounter for screening for osteoporosis: Secondary | ICD-10-CM | POA: Insufficient documentation

## 2010-12-10 DIAGNOSIS — Z139 Encounter for screening, unspecified: Secondary | ICD-10-CM

## 2010-12-10 DIAGNOSIS — M818 Other osteoporosis without current pathological fracture: Secondary | ICD-10-CM | POA: Insufficient documentation

## 2010-12-10 DIAGNOSIS — Z780 Asymptomatic menopausal state: Secondary | ICD-10-CM | POA: Insufficient documentation

## 2010-12-13 ENCOUNTER — Inpatient Hospital Stay (HOSPITAL_COMMUNITY)
Admission: EM | Admit: 2010-12-13 | Discharge: 2010-12-14 | DRG: 310 | Disposition: A | Discharge status: Home / Self Care | Payer: Medicare Other | Attending: Internal Medicine | Admitting: Internal Medicine

## 2010-12-13 ENCOUNTER — Inpatient Hospital Stay (HOSPITAL_COMMUNITY): Payer: Medicare Other

## 2010-12-13 ENCOUNTER — Emergency Department (HOSPITAL_COMMUNITY): Payer: Medicare Other

## 2010-12-13 ENCOUNTER — Encounter: Payer: Self-pay | Admitting: Emergency Medicine

## 2010-12-13 DIAGNOSIS — R911 Solitary pulmonary nodule: Secondary | ICD-10-CM | POA: Diagnosis present

## 2010-12-13 DIAGNOSIS — I480 Paroxysmal atrial fibrillation: Secondary | ICD-10-CM | POA: Diagnosis present

## 2010-12-13 DIAGNOSIS — I4891 Unspecified atrial fibrillation: Principal | ICD-10-CM | POA: Diagnosis present

## 2010-12-13 DIAGNOSIS — I10 Essential (primary) hypertension: Secondary | ICD-10-CM | POA: Diagnosis present

## 2010-12-13 DIAGNOSIS — J40 Bronchitis, not specified as acute or chronic: Secondary | ICD-10-CM | POA: Diagnosis present

## 2010-12-13 DIAGNOSIS — E785 Hyperlipidemia, unspecified: Secondary | ICD-10-CM | POA: Diagnosis present

## 2010-12-13 DIAGNOSIS — E119 Type 2 diabetes mellitus without complications: Secondary | ICD-10-CM | POA: Diagnosis present

## 2010-12-13 HISTORY — DX: Atherosclerotic heart disease of native coronary artery without angina pectoris: I25.10

## 2010-12-13 LAB — DIFFERENTIAL
Basophils Relative: 0 % (ref 0–1)
Eosinophils Absolute: 0.2 10*3/uL (ref 0.0–0.7)
Eosinophils Relative: 2 % (ref 0–5)
Monocytes Absolute: 0.5 10*3/uL (ref 0.1–1.0)
Monocytes Relative: 7 % (ref 3–12)

## 2010-12-13 LAB — CBC
HCT: 38.6 % (ref 36.0–46.0)
Hemoglobin: 13.3 g/dL (ref 12.0–15.0)
MCH: 33.8 pg (ref 26.0–34.0)
MCHC: 34.5 g/dL (ref 30.0–36.0)
RDW: 13.3 % (ref 11.5–15.5)

## 2010-12-13 LAB — BASIC METABOLIC PANEL
BUN: 14 mg/dL (ref 6–23)
Calcium: 9.6 mg/dL (ref 8.4–10.5)
Creatinine, Ser: 0.85 mg/dL (ref 0.50–1.10)
GFR calc Af Amer: 71 mL/min — ABNORMAL LOW (ref >90)
GFR calc non Af Amer: 61 mL/min — ABNORMAL LOW (ref >90)

## 2010-12-13 LAB — CARDIAC PANEL(CRET KIN+CKTOT+MB+TROPI): Relative Index: INVALID (ref 0.0–2.5)

## 2010-12-13 LAB — PROTIME-INR: Prothrombin Time: 14 seconds (ref 11.6–15.2)

## 2010-12-13 LAB — GLUCOSE, CAPILLARY: Glucose-Capillary: 148 mg/dL — ABNORMAL HIGH (ref 70–99)

## 2010-12-13 LAB — MAGNESIUM: Magnesium: 1.9 mg/dL (ref 1.5–2.5)

## 2010-12-13 MED ORDER — OMEGA-3-ACID ETHYL ESTERS 1 G PO CAPS
1.0000 g | ORAL_CAPSULE | Freq: Every day | ORAL | Status: DC
  Administered 2010-12-13 – 2010-12-14 (×2): 1 g via ORAL
  Filled 2010-12-13 (×2): qty 1

## 2010-12-13 MED ORDER — INSULIN ASPART 100 UNIT/ML ~~LOC~~ SOLN
0.0000 [IU] | Freq: Three times a day (TID) | SUBCUTANEOUS | Status: DC
  Administered 2010-12-13: 2 [IU] via SUBCUTANEOUS
  Filled 2010-12-13 (×2): qty 3

## 2010-12-13 MED ORDER — LEVALBUTEROL HCL 0.63 MG/3ML IN NEBU: 0.6300 mg | INHALATION_SOLUTION | Freq: Four times a day (QID) | RESPIRATORY_TRACT | Status: DC | PRN

## 2010-12-13 MED ORDER — INSULIN ASPART 100 UNIT/ML ~~LOC~~ SOLN: 0.0000 [IU] | Freq: Every day | SUBCUTANEOUS | Status: DC

## 2010-12-13 MED ORDER — CALCIUM CARBONATE 1250 (500 CA) MG PO TABS
1.0000 | ORAL_TABLET | Freq: Every day | ORAL | Status: DC
  Administered 2010-12-13 – 2010-12-14 (×2): 500 mg via ORAL
  Filled 2010-12-13 (×2): qty 1

## 2010-12-13 MED ORDER — ACETAMINOPHEN 650 MG RE SUPP: 650.0000 mg | Freq: Four times a day (QID) | RECTAL | Status: DC | PRN

## 2010-12-13 MED ORDER — ALUM & MAG HYDROXIDE-SIMETH 200-200-20 MG/5ML PO SUSP: 30.0000 mL | Freq: Four times a day (QID) | ORAL | Status: DC | PRN

## 2010-12-13 MED ORDER — IOHEXOL 300 MG/ML  SOLN
80.0000 mL | Freq: Once | INTRAMUSCULAR | Status: AC | PRN
  Administered 2010-12-13: 80 mL via INTRAVENOUS

## 2010-12-13 MED ORDER — ONDANSETRON HCL 4 MG/2ML IJ SOLN: 4.0000 mg | Freq: Four times a day (QID) | INTRAMUSCULAR | Status: DC | PRN

## 2010-12-13 MED ORDER — CARVEDILOL 12.5 MG PO TABS
25.0000 mg | ORAL_TABLET | Freq: Two times a day (BID) | ORAL | Status: DC
  Administered 2010-12-13 – 2010-12-14 (×2): 25 mg via ORAL
  Filled 2010-12-13 (×2): qty 2

## 2010-12-13 MED ORDER — INSULIN GLARGINE 100 UNIT/ML ~~LOC~~ SOLN
10.0000 [IU] | Freq: Every day | SUBCUTANEOUS | Status: DC
  Administered 2010-12-13: 10 [IU] via SUBCUTANEOUS
  Filled 2010-12-13: qty 3

## 2010-12-13 MED ORDER — DOCUSATE SODIUM 100 MG PO CAPS
100.0000 mg | ORAL_CAPSULE | Freq: Two times a day (BID) | ORAL | Status: DC
  Administered 2010-12-13 – 2010-12-14 (×2): 100 mg via ORAL
  Filled 2010-12-13 (×3): qty 1

## 2010-12-13 MED ORDER — HYDROCODONE-ACETAMINOPHEN 5-325 MG PO TABS: 1.0000 | ORAL_TABLET | ORAL | Status: DC | PRN

## 2010-12-13 MED ORDER — SODIUM CHLORIDE 0.9 % IV SOLN
INTRAVENOUS | Status: AC
  Administered 2010-12-13: 13:00:00 via INTRAVENOUS

## 2010-12-13 MED ORDER — ACETAMINOPHEN 325 MG PO TABS: 650.0000 mg | ORAL_TABLET | Freq: Four times a day (QID) | ORAL | Status: DC | PRN

## 2010-12-13 MED ORDER — CARVEDILOL 12.5 MG PO TABS
25.0000 mg | ORAL_TABLET | Freq: Two times a day (BID) | ORAL | Status: DC
  Filled 2010-12-13 (×2): qty 1

## 2010-12-13 MED ORDER — POTASSIUM CHLORIDE IN NACL 20-0.9 MEQ/L-% IV SOLN
INTRAVENOUS | Status: DC
  Administered 2010-12-13 – 2010-12-14 (×2): via INTRAVENOUS

## 2010-12-13 MED ORDER — SODIUM CHLORIDE 0.9 % IV SOLN
INTRAVENOUS | Status: DC
  Administered 2010-12-13: 10:00:00 via INTRAVENOUS

## 2010-12-13 MED ORDER — ASPIRIN EC 81 MG PO TBEC
81.0000 mg | DELAYED_RELEASE_TABLET | Freq: Every day | ORAL | Status: DC
  Administered 2010-12-14: 81 mg via ORAL
  Filled 2010-12-13 (×2): qty 1

## 2010-12-13 MED ORDER — ENOXAPARIN SODIUM 40 MG/0.4ML ~~LOC~~ SOLN
40.0000 mg | SUBCUTANEOUS | Status: DC
  Administered 2010-12-13: 40 mg via SUBCUTANEOUS
  Filled 2010-12-13: qty 0.4

## 2010-12-13 MED ORDER — ONDANSETRON HCL 4 MG PO TABS: 4.0000 mg | ORAL_TABLET | Freq: Four times a day (QID) | ORAL | Status: DC | PRN

## 2010-12-13 MED ORDER — AMIODARONE HCL 200 MG PO TABS
400.0000 mg | ORAL_TABLET | Freq: Two times a day (BID) | ORAL | Status: DC
  Administered 2010-12-13 – 2010-12-14 (×3): 400 mg via ORAL
  Filled 2010-12-13 (×3): qty 2

## 2010-12-13 NOTE — Consult Note (Addendum)
[facility]     CONSULTATION NOTE   Reason for Consult: Chest pain. Atrial fibrillation with rapid ventricular response.  Requesting Physician: Dr. Jodelle Gross  HPI: This is a 77 y.o. female with a past medical history significant for paroxysmal atrial fibrillation, hypertension, dyslipidemia, mild coronary artery disease with a 50% right coronary artery lesion and 2006. She had a reduced ejection fraction at that time however this subsequently improved. She had a recent stress test in 03/15/2009 which demonstrated normal perfusion ejection fraction of 54%. She apparently presents today with chest pain and pain that was radiating up her neck. She awoke and noted her heart was racing. She took her vitals and saw that she had a heart rate of 138.  BP was in the 100/systolic range. She first contacted the office and was advised to present to the emergency department. On initial presentation she was found to be in atrial fibrillation with rapid ventricular response. Subsequently she spontaneously converted to sinus rhythm. Her EKG did demonstrate ST depressions with her tachycardia, inferiorly. Initial cardiac enzymes were negative. She's not currently on Coumadin due to history of severe hemarthrosis requiring joint washout. We are asked to consult for ongoing management of her atrial fibrillation.  PMHx:  Past Medical History  Diagnosis Date  . Hypertension   . Diabetes mellitus   . High cholesterol   . Atrial fibrillation   . Paroxysmal a-fib 12/13/2010  . Hypertension 05/03/2008   Past Surgical History  Procedure Date  . Replacement total knee     FAMHx: CAD in the family, no family history of a-fib  SOCHx: Nonsmoker. Denies alcohol recreational drug use.  ALLERGIES: Allergies  Allergen Reactions  . Morphine   . Sulfonamide Derivatives     ROS: A comprehensive review of systems was negative except for: Respiratory: positive for dyspnea on  exertion Cardiovascular: positive for chest pain and irregular heart beat Neurological: positive for anxiety  HOME MEDICATIONS:  (Not in a hospital admission)  HOSPITAL MEDICATIONS: Prior to Admission:  Prescriptions prior to admission  Medication Sig Dispense Refill  . alendronate (FOSAMAX) 35 MG tablet Take 35 mg by mouth every 7 (seven) days. Take with a full glass of water on an empty stomach.       Marland Kitchen aspirin EC 81 MG tablet Take 81 mg by mouth daily.        . calcium carbonate (OS-CAL - DOSED IN MG OF ELEMENTAL CALCIUM) 1250 MG tablet Take 1 tablet by mouth daily.        . carvedilol (COREG) 25 MG tablet Take 25 mg by mouth 2 (two) times daily with a meal.        . fish oil-omega-3 fatty acids 1000 MG capsule Take 1 g by mouth daily.        Marland Kitchen glyBURIDE-metformin (GLUCOVANCE) 2.5-500 MG per tablet Take 0.5 tablets by mouth daily with breakfast.        . HYDROcodone-acetaminophen (VICODIN) 5-500 MG per tablet Take 1 tablet by mouth every 6 (six) hours as needed. pain       . metFORMIN (GLUCOPHAGE) 500 MG tablet Take 500 mg by mouth at bedtime.        Marland Kitchen olmesartan (BENICAR) 40 MG tablet Take 40 mg by mouth 2 (two) times daily.        . simvastatin (ZOCOR) 40 MG tablet Take 40 mg by mouth at bedtime.          VITALS: Blood pressure 131/72, pulse 68,  temperature 98 F (36.7 C), resp. rate 20, height 5\' 8"  (1.727 m), weight 80.74 kg (178 lb), SpO2 96.00%.  PHYSICAL EXAM: General appearance: alert, appears stated age and no distress Neck: no adenopathy, no carotid bruit, no JVD, supple, symmetrical, trachea midline and thyroid not enlarged, symmetric, no tenderness/mass/nodules Lungs: clear to auscultation bilaterally Heart: regular rate and rhythm, S1, S2 normal, no murmur, click, rub or gallop Abdomen: soft, non-tender; bowel sounds normal; no masses,  no organomegaly Extremities: extremities normal, atraumatic, no cyanosis or edema Pulses: 2+ and symmetric Skin: Skin color,  texture, turgor normal. No rashes or lesions Neurologic: Grossly normal  LABS: Results for orders placed during the hospital encounter of 12/13/10 (from the past 48 hour(s))  CBC     Status: Normal   Collection Time   12/13/10  9:09 AM      Component Value Range Comment   WBC 6.9  4.0 - 10.5 (K/uL)    RBC 3.94  3.87 - 5.11 (MIL/uL)    Hemoglobin 13.3  12.0 - 15.0 (g/dL)    HCT 16.1  09.6 - 04.5 (%)    MCV 98.0  78.0 - 100.0 (fL)    MCH 33.8  26.0 - 34.0 (pg)    MCHC 34.5  30.0 - 36.0 (g/dL)    RDW 40.9  81.1 - 91.4 (%)    Platelets 235  150 - 400 (K/uL)   DIFFERENTIAL     Status: Normal   Collection Time   12/13/10  9:09 AM      Component Value Range Comment   Neutrophils Relative 60  43 - 77 (%)    Neutro Abs 4.2  1.7 - 7.7 (K/uL)    Lymphocytes Relative 31  12 - 46 (%)    Lymphs Abs 2.1  0.7 - 4.0 (K/uL)    Monocytes Relative 7  3 - 12 (%)    Monocytes Absolute 0.5  0.1 - 1.0 (K/uL)    Eosinophils Relative 2  0 - 5 (%)    Eosinophils Absolute 0.2  0.0 - 0.7 (K/uL)    Basophils Relative 0  0 - 1 (%)    Basophils Absolute 0.0  0.0 - 0.1 (K/uL)   BASIC METABOLIC PANEL     Status: Abnormal   Collection Time   12/13/10  9:09 AM      Component Value Range Comment   Sodium 138  135 - 145 (mEq/L)    Potassium 4.2  3.5 - 5.1 (mEq/L)    Chloride 100  96 - 112 (mEq/L)    CO2 28  19 - 32 (mEq/L)    Glucose, Bld 211 (*) 70 - 99 (mg/dL)    BUN 14  6 - 23 (mg/dL)    Creatinine, Ser 7.82  0.50 - 1.10 (mg/dL)    Calcium 9.6  8.4 - 10.5 (mg/dL)    GFR calc non Af Amer 61 (*) >90 (mL/min)    GFR calc Af Amer 71 (*) >90 (mL/min)   POCT I-STAT TROPONIN I     Status: Normal   Collection Time   12/13/10  9:12 AM      Component Value Range Comment   Troponin i, poc 0.01  0.00 - 0.08 (ng/mL)    Comment 3            TROPONIN I     Status: Normal   Collection Time   12/13/10  9:33 AM      Component Value Range Comment   Troponin I <  0.30  <0.30 (ng/mL)   PROTIME-INR     Status: Normal    Collection Time   12/13/10  9:33 AM      Component Value Range Comment   Prothrombin Time 14.0  11.6 - 15.2 (seconds)    INR 1.06  0.00 - 1.49    TROPONIN I     Status: Normal   Collection Time   12/13/10 11:16 AM      Component Value Range Comment   Troponin I <0.30  <0.30 (ng/mL)     IMAGING: Dg Chest Portable 1 View  12/13/2010  *RADIOLOGY REPORT*  Clinical Data: Chest pain, hypertension  PORTABLE CHEST - 1 VIEW  Comparison: Chest x-ray of 09/19/2008  Findings: There is an opacity within the right upper lung field overlying the anterior right first rib.  This appears to be new and a developing lung lesion is a definite consideration.  CT of the chest may be helpful to assess further if warranted clinically. Otherwise the lungs are clear and hyperaerated.  Cardiomegaly is stable.  No acute bony abnormality is seen.  IMPRESSION:  1.  New nodular opacity in the right upper lung field.  Possible pneumonia but cannot exclude developing neoplasm.  Consider CT of the chest to assess further if warranted clinically. 2.  Cardiomegaly.  Original Report Authenticated By: Juline Patch, M.D.    IMPRESSION: 1. Atrial fibrillation with rapid ventricular response 2. Chest pain 3. Right upper lung field opacity on x-ray (apparently a chronic problem, ?scar from prior pneumonia) 4. Hypertension-controlled  RECOMMENDATION: 1. Given her symptomatic paroxysmal atrial fibrillation, I recommend starting antiarrhythmic therapy. As there is no underlying coronary disease it would be safest to start either amiodarone or Multaq, which will likely be cost prohibitive. I recommend starting oral loading of amiodarone at 400 mg by mouth twice a day (starting tonight) for 2 weeks and then reducing the dose to 400 mg daily.  2. If she rules out for MI, she can likely be discharged in the morning and she can followup with me in the office in 2 weeks - otherwise she may need transfer to come hospital for cardiac  catheterization. 3. Please check thyroid function and PFT's, since we are starting amiodarone. 4. Continue current dose carvedilol and re-check EKG in am to follow QTc. 5. She is refusing coumadin, xarelto or pradaxa due to her previous experience of hemarthrosis on coumadin. If we can keep her a-fib burden low, then she would likely be a low stroke risk on aspirin.  Thanks for caring for Mrs. Aliberti. Feel free to consult [place] with further questions.  Time Spent Directly with Patient: 30  minutes  [name],[name] 12/13/2010, 12:49 PM

## 2010-12-13 NOTE — H&P (Signed)
Kathleen [name] MRN: [id number] DOB/AGE: [birth year] 77 y.o. Primary Care Physician:MCGOUGH,WILLIAM M, MD Admit date: 12/13/2010 Chief Complaint: Chest palpitations and chest pain. HPI: The patient is an 77 year old woman with a past medical history significant for paroxysmal atrial fibrillation, type 2 diabetes mellitus, and hypertension, who presented to the emergency department this morning with a chief complaint of chest pain and chest palpitations. She felt her heart racing with palpitations after she got up this morning. Shortly thereafter, she experienced substernal chest pain that radiated to her neck and throat. She describes the pain as an achy pain. At that time, it was rated 5/10 in intensity. She had already taken her baby aspirin and her other chronic medications, but she took nothing else for the pain. The pain persisted until she arrived to the emergency department. In the emergency department, her pain had nearly resolved. She denies radiation of the pain to her left arm. She denies lightheadedness, nausea, and sweating. She did have transient shortness of breath. Over the past several weeks, she has had easy fatigability. She has had no recent fever, cough, or chest congestion.  In the emergency department, the initial telemetry strip revealed a heart rate of 133 beats per minute. The 12-lead EKG revealed bifascicular block, normal sinus rhythm, and a heart rate of 81 beats per minute.x reveals a new nodular opacity in the right upper lung field and cardiomegaly. She is afebrile. Her blood pressure is 120-150 systolically. Her troponin I. is within normal limits. Her INR is within normal limits. She is being admitted for further evaluation and management.  Past Medical History  Diagnosis Date  . Hypertension   . Diabetes mellitus   . High cholesterol   . Atrial fibrillation   . Paroxysmal a-fib 12/13/2010  . PNA (pneumonia)   Coronary artery disease, per cardiac catheterization in  2006. Nuclear stress test in March of 2011 revealed a normal study with an ejection fraction of 54%.  Past Surgical History  Procedure Date  . Replacement total knee     Prior to Admission medications   Medication Sig Start Date End Date Taking? Authorizing Provider  alendronate (FOSAMAX) 35 MG tablet Take 35 mg by mouth every 7 (seven) days. Take with a full glass of water on an empty stomach.    Yes Historical Provider, MD  aspirin EC 81 MG tablet Take 81 mg by mouth daily.     Yes Historical Provider, MD  calcium carbonate (OS-CAL - DOSED IN MG OF ELEMENTAL CALCIUM) 1250 MG tablet Take 1 tablet by mouth daily.     Yes Historical Provider, MD  carvedilol (COREG) 25 MG tablet Take 25 mg by mouth 2 (two) times daily with a meal.     Yes Historical Provider, MD  fish oil-omega-3 fatty acids 1000 MG capsule Take 1 g by mouth daily.     Yes Historical Provider, MD  glyBURIDE-metformin (GLUCOVANCE) 2.5-500 MG per tablet Take 0.5 tablets by mouth daily with breakfast.     Yes Historical Provider, MD  HYDROcodone-acetaminophen (VICODIN) 5-500 MG per tablet Take 1 tablet by mouth every 6 (six) hours as needed. pain    Yes Historical Provider, MD  metFORMIN (GLUCOPHAGE) 500 MG tablet Take 500 mg by mouth at bedtime.     Yes Historical Provider, MD  olmesartan (BENICAR) 40 MG tablet Take 40 mg by mouth 2 (two) times daily.     Yes Historical Provider, MD  simvastatin (ZOCOR) 40 MG tablet Take 40 mg by mouth at bedtime.  Yes Historical Provider, MD    Allergies:  Allergies  Allergen Reactions  . Morphine   . Sulfonamide Derivatives     History reviewed. No pertinent family history.  Social History: She lives in [place]. Her daughter Lourdes Sledge lives with her. She is retired. She denies tobacco, alcohol, and illicit drug use. She still drives.    ROS: Above indicated in the history of present illness. Otherwise review of systems is negative.  PHYSICAL EXAM: Blood  pressure 151/72, pulse 71, temperature 98.2 F (36.8 C), temperature source Oral, resp. rate 18, height 5\' 8"  (1.727 m), weight 81.2 kg (179 lb 0.2 oz), SpO2 96.00%. @iobriefs @ General: The patient is a pleasant 77 year old Caucasian woman who is currently sitting up in bed in no acute distress. HEENT: Head is normocephalic, nontraumatic. Pupils are equal round and reactive to light. Extraocular movements are intact. Conjunctivae are clear. Sclerae are white. Oropharynx reveals moist mucous membranes. Good dentition. No posterior exudates or erythema. Neck: Supple, no adenopathy, no thyromegaly, no JVD. Lungs: Clear to auscultation bilaterally. Heart: S1, S2, with a soft systolic murmur. Abdomen: Positive bowel sounds, soft, nontender, nondistended. Extremities: No pedal edema. Pedal pulses palpable. Neurologic/psychological: She is alert and oriented x3. Cranial nerves II through XII are intact. Strength is 5 over 5 throughout. Sensation is grossly intact. Speech is clear. Pleasant affect. Cooperative.  Basic Metabolic Panel:  Basename 12/13/10 0909  NA 138  K 4.2  CL 100  CO2 28  GLUCOSE 211*  BUN 14  CREATININE 0.85  CALCIUM 9.6  MG --  PHOS --   Liver Function Tests: No results found for this basename: AST:2,ALT:2,ALKPHOS:2,BILITOT:2,PROT:2,ALBUMIN:2 in the last 72 hours No results found for this basename: LIPASE:2,AMYLASE:2 in the last 72 hours No results found for this basename: AMMONIA:2 in the last 72 hours CBC:  Basename 12/13/10 0909  WBC 6.9  NEUTROABS 4.2  HGB 13.3  HCT 38.6  MCV 98.0  PLT 235   Cardiac Enzymes:  Basename 12/13/10 1116 12/13/10 0933  CKTOTAL -- --  CKMB -- --  CKMBINDEX -- --  TROPONINI <0.30 <0.30   BNP: No results found for this basename: POCBNP:3 in the last 72 hours D-Dimer: No results found for this basename: DDIMER:2 in the last 72 hours CBG: No results found for this basename: GLUCAP:6 in the last 72 hours Hemoglobin A1C: No  results found for this basename: HGBA1C in the last 72 hours Fasting Lipid Panel: No results found for this basename: CHOL,HDL,LDLCALC,TRIG,CHOLHDL,LDLDIRECT in the last 72 hours Thyroid Function Tests: No results found for this basename: TSH,T4TOTAL,FREET4,T3FREE,THYROIDAB in the last 72 hours Anemia Panel: No results found for this basename: VITAMINB12,FOLATE,FERRITIN,TIBC,IRON,RETICCTPCT in the last 72 hours Coagulation:  Basename 12/13/10 0933  LABPROT 14.0  INR 1.06   Urine Drug Screen: Drugs of Abuse  No results found for this basename: labopia, cocainscrnur, labbenz, amphetmu, thcu, labbarb    Alcohol Level: No results found for this basename: ETH:2 in the last 72 hours   EKG: As above in the history of present illness.  No results found for this or any previous visit (from the past 240 hour(s)).   Results for orders placed during the hospital encounter of 12/13/10 (from the past 48 hour(s))  CBC     Status: Normal   Collection Time   12/13/10  9:09 AM      Component Value Range Comment   WBC 6.9  4.0 - 10.5 (K/uL)    RBC 3.94  3.87 - 5.11 (MIL/uL)  Hemoglobin 13.3  12.0 - 15.0 (g/dL)    HCT 16.1  09.6 - 04.5 (%)    MCV 98.0  78.0 - 100.0 (fL)    MCH 33.8  26.0 - 34.0 (pg)    MCHC 34.5  30.0 - 36.0 (g/dL)    RDW 40.9  81.1 - 91.4 (%)    Platelets 235  150 - 400 (K/uL)   DIFFERENTIAL     Status: Normal   Collection Time   12/13/10  9:09 AM      Component Value Range Comment   Neutrophils Relative 60  43 - 77 (%)    Neutro Abs 4.2  1.7 - 7.7 (K/uL)    Lymphocytes Relative 31  12 - 46 (%)    Lymphs Abs 2.1  0.7 - 4.0 (K/uL)    Monocytes Relative 7  3 - 12 (%)    Monocytes Absolute 0.5  0.1 - 1.0 (K/uL)    Eosinophils Relative 2  0 - 5 (%)    Eosinophils Absolute 0.2  0.0 - 0.7 (K/uL)    Basophils Relative 0  0 - 1 (%)    Basophils Absolute 0.0  0.0 - 0.1 (K/uL)   BASIC METABOLIC PANEL     Status: Abnormal   Collection Time   12/13/10  9:09 AM       Component Value Range Comment   Sodium 138  135 - 145 (mEq/L)    Potassium 4.2  3.5 - 5.1 (mEq/L)    Chloride 100  96 - 112 (mEq/L)    CO2 28  19 - 32 (mEq/L)    Glucose, Bld 211 (*) 70 - 99 (mg/dL)    BUN 14  6 - 23 (mg/dL)    Creatinine, Ser 7.82  0.50 - 1.10 (mg/dL)    Calcium 9.6  8.4 - 10.5 (mg/dL)    GFR calc non Af Amer 61 (*) >90 (mL/min)    GFR calc Af Amer 71 (*) >90 (mL/min)   POCT I-STAT TROPONIN I     Status: Normal   Collection Time   12/13/10  9:12 AM      Component Value Range Comment   Troponin i, poc 0.01  0.00 - 0.08 (ng/mL)    Comment 3            TROPONIN I     Status: Normal   Collection Time   12/13/10  9:33 AM      Component Value Range Comment   Troponin I <0.30  <0.30 (ng/mL)   PROTIME-INR     Status: Normal   Collection Time   12/13/10  9:33 AM      Component Value Range Comment   Prothrombin Time 14.0  11.6 - 15.2 (seconds)    INR 1.06  0.00 - 1.49    TROPONIN I     Status: Normal   Collection Time   12/13/10 11:16 AM      Component Value Range Comment   Troponin I <0.30  <0.30 (ng/mL)     Dg Bone Density  12/10/2010  The Bone Mineral Densitometry hard-copy report (which includes all data, graphical display, and FRAX results when applicable) has been sent directly to the ordering physician.  This report can also be obtained electronically by viewing images for this exam through the performing facility's EMR, or by logging directly into [employer].  Original Report Authenticated By: Britta Mccreedy, M.D.   Dg Chest Portable 1 View  12/13/2010  *RADIOLOGY REPORT*  Clinical Data:  Chest pain, hypertension  PORTABLE CHEST - 1 VIEW  Comparison: Chest x-ray of 09/19/2008  Findings: There is an opacity within the right upper lung field overlying the anterior right first rib.  This appears to be new and a developing lung lesion is a definite consideration.  CT of the chest may be helpful to assess further if warranted clinically. Otherwise the lungs are clear  and hyperaerated.  Cardiomegaly is stable.  No acute bony abnormality is seen.  IMPRESSION:  1.  New nodular opacity in the right upper lung field.  Possible pneumonia but cannot exclude developing neoplasm.  Consider CT of the chest to assess further if warranted clinically. 2.  Cardiomegaly.  Original Report Authenticated By: Juline Patch, M.D.    Impression:  Principal Problem:  *Paroxysmal a-fib Active Problems:  Hypertension  Lung nodule  Atrial fibrillation with RVR  DM type 2 (diabetes mellitus, type 2)  Hyperlipidemia  Paroxysmal atrial fibrillation with rapid ventricular response. Her rhythm has already converted to normal sinus rhythm. Her heart rate is already controlled without additional medications. Cardiologist, Dr. Rennis Golden, has already seen and evaluated the patient. His assessment and recommendations are noted and appreciated. Will continue Coreg and add amiodarone as recommended. The patient refuses Coumadin because of her history of hemarthrosis on Coumadin in the past. Therefore, we will maintain antiplatelet therapy with aspirin.  Chest pain. The chest pain may be secondary to rapid ventricular rate. However, she does have coronary artery disease and a negative followup nuclear stress test in 2011.  Hypertension. Currently stable on chronic medications.  Type 2 diabetes mellitus. The patient is treated with glyburide and metformin.  Hyperlipidemia. She is treated chronically with Zocor.  New right upper lobe opacity. The patient says that she has had pneumonia in this area previously. She has a history of scarring of her long. However, in light of the new finding, a CT scan of her chest will be ordered.   Plan:  1. Continue chronic medications. Will start amiodarone as recommended by Dr. Rennis Golden. Supportive treatment with analgesics and antiemetics as needed. We'll hold all diabetes medications and start her on sliding scale NovoLog and Lantus. For further  evaluation, we will check cardiac enzymes, TSH, free T4, fasting lipid profile, and hemoglobin A1c. We will order a CT scan of her chest with contrast to evaluate the right upper lobe opacity. If she rules out for myocardial infarction and is asymptomatic in the morning, she could be discharged to home with followup with Dr. Rennis Golden in 2 weeks.      Galya Dunnigan 12/13/2010, 3:39 PM

## 2010-12-13 NOTE — ED Notes (Signed)
Family at bedside. Patient is comfortable. Walked to the restroom and back with no difficulties. Patient states she is feeling almost 100% better.

## 2010-12-13 NOTE — Progress Notes (Signed)
UR Chart Review Completed  

## 2010-12-13 NOTE — ED Notes (Signed)
Pt c/o "heart palpitations" since 0700 this am with intermittent cp. Denies sob/n/v/. nad noted.

## 2010-12-13 NOTE — ED Notes (Signed)
Family at bedside. Patient does not need anything at this time. 

## 2010-12-13 NOTE — ED Provider Notes (Signed)
History    Scribed for Shelda Jakes, MD, the patient was seen in room APA02/APA02. This chart was scribed by Katha Cabal.   CSN: [id number] Arrival date & time: 12/13/2010  9:12 AM   First MD Initiated Contact with Patient 12/13/10 [phone]      Chief Complaint  Patient presents with  . Chest Pain    (Consider location/radiation/quality/duration/timing/severity/associated sxs/prior treatment) Patient is a 77 y.o. female presenting with chest pain and palpitations. The history is provided by the patient. No language interpreter was used.  Chest Pain Episode onset: 7 AM  Episode Length: a little over an hour  Chest pain occurs intermittently. The chest pain is resolved. Pain severity now: mild to moderate  The quality of the pain is described as aching. The pain does not radiate. Primary symptoms include palpitations. Pertinent negatives for primary symptoms include no shortness of breath, no abdominal pain, no nausea and no vomiting.  The palpitations did not occur with syncope or shortness of breath. She tried aspirin for the symptoms. Risk factors include being elderly and post-menopausal.  Her past medical history is significant for diabetes and hypertension. Past medical history comments: A-FIB, Hypercholesterolemia     Palpitations  Associated symptoms include chest pain. Pertinent negatives include no abdominal pain, no nausea, no vomiting, no back pain (baseline ) and no shortness of breath. Past medical history comments: A-FIB, Hypercholesterolemia .  Patient took ASA prior to arrival.  Patient reports palpitations in the past with atrial fibrillation.   Patient reports some shortness of breath with exertion earlier in the week.    Cardiologist: [facility]  PCP Kirk Ruths, MD   Past Medical History  Diagnosis Date  . Hypertension   . Diabetes mellitus   . High cholesterol   . Atrial fibrillation     Past Surgical History  Procedure  Date  . Replacement total knee     No family history on file.  History  Substance Use Topics  . Smoking status: Not on file  . Smokeless tobacco: Not on file  . Alcohol Use:     OB History    Grav Para Term Preterm Abortions TAB SAB Ect Mult Living                  Review of Systems  Respiratory: Negative for shortness of breath.   Cardiovascular: Positive for chest pain and palpitations. Negative for leg swelling.  Gastrointestinal: Negative for nausea, vomiting and abdominal pain.  Musculoskeletal: Negative for back pain (baseline ).  Neurological: Negative for syncope.  All other systems reviewed and are negative.    Allergies  Morphine and Sulfonamide derivatives  Home Medications   Current Outpatient Rx  Name Route Sig Dispense Refill  . ALENDRONATE SODIUM 35 MG PO TABS Oral Take 35 mg by mouth every 7 (seven) days. Take with a full glass of water on an empty stomach.     . ASPIRIN EC 81 MG PO TBEC Oral Take 81 mg by mouth daily.      Marland Kitchen CALCIUM CARBONATE 1250 MG PO TABS Oral Take 1 tablet by mouth daily.      Marland Kitchen CARVEDILOL 25 MG PO TABS Oral Take 25 mg by mouth 2 (two) times daily with a meal.      . OMEGA-3 FATTY ACIDS 1000 MG PO CAPS Oral Take 1 g by mouth daily.      . GLYBURIDE-METFORMIN 2.5-500 MG PO TABS Oral Take 0.5 tablets by mouth daily with  breakfast.      . HYDROCODONE-ACETAMINOPHEN 5-500 MG PO TABS Oral Take 1 tablet by mouth every 6 (six) hours as needed. pain     . METFORMIN HCL 500 MG PO TABS Oral Take 500 mg by mouth at bedtime.      Marland Kitchen OLMESARTAN MEDOXOMIL 40 MG PO TABS Oral Take 40 mg by mouth 2 (two) times daily.      Marland Kitchen SIMVASTATIN 40 MG PO TABS Oral Take 40 mg by mouth at bedtime.        BP 128/68  Pulse 71  Temp 98 F (36.7 C)  Resp 21  Ht 5\' 8"  (1.727 m)  Wt 178 lb (80.74 kg)  BMI 27.06 kg/m2  SpO2 96%  Physical Exam  Constitutional: She is oriented to person, place, and time. She appears well-developed and well-nourished. No  distress.  HENT:  Head: Normocephalic and atraumatic.  Eyes: Conjunctivae and EOM are normal.  Neck: Normal range of motion.  Cardiovascular: Normal rate, regular rhythm and normal heart sounds.   Pulmonary/Chest: Effort normal and breath sounds normal. No respiratory distress. She has no wheezes. She has no rales.  Abdominal: Soft. There is no tenderness. There is no rebound and no guarding.  Musculoskeletal: Normal range of motion. She exhibits no edema.  Neurological: She is alert and oriented to person, place, and time.  Skin: Skin is warm and dry. She is not diaphoretic.  Psychiatric: She has a normal mood and affect. Her behavior is normal.    ED Course  Procedures (including critical care time)   DIAGNOSTIC STUDIES: Oxygen Saturation is 98% on room air, normal by my interpretation.      COORDINATION OF CARE:   9:25 AM  Patient reports being at baseline.  Denies palpitations and chest pain.   9:42 AM  Physical exam complete.  Will review labs and CXR.   11:12 AM  Spoke with [facility] who recommended admission.  Plan to admit patient.    Orders Placed This Encounter  Procedures  . DG Chest Portable 1 View  . CBC  . Differential  . Basic metabolic panel  . Troponin I  . Protime-INR  . Troponin I  . Diet Carb Modified  . Cardiac monitoring  . Cardiac monitoring  . Send any printed patient health information to unit with patient  . Page Admitting Doctor upon patients arrival to unit/floor  . Vital signs  . Activity as tolerated  . Consult to cardiology  . Consult to hospitalist  . Pulse oximetry, continuous  . POCT i-Stat troponin I  . ED EKG  . ED EKG  . Saline lock IV  . Admit to Inpatient (Please place the Bed Request Below)     LABS / RADIOLOGY:   Labs Reviewed  BASIC METABOLIC PANEL - Abnormal; Notable for the following:    Glucose, Bld 211 (*)    GFR calc non Af Amer 61 (*)    GFR calc Af Amer 71 (*)    All other components  within normal limits  CBC  DIFFERENTIAL  POCT I-STAT TROPONIN I  TROPONIN I  PROTIME-INR  I-STAT TROPONIN I  TROPONIN I   Results for orders placed during the hospital encounter of 12/13/10  CBC      Component Value Range   WBC 6.9  4.0 - 10.5 (K/uL)   RBC 3.94  3.87 - 5.11 (MIL/uL)   Hemoglobin 13.3  12.0 - 15.0 (g/dL)   HCT 87.5  64.3 -  46.0 (%)   MCV 98.0  78.0 - 100.0 (fL)   MCH 33.8  26.0 - 34.0 (pg)   MCHC 34.5  30.0 - 36.0 (g/dL)   RDW 40.9  81.1 - 91.4 (%)   Platelets 235  150 - 400 (K/uL)  DIFFERENTIAL      Component Value Range   Neutrophils Relative 60  43 - 77 (%)   Neutro Abs 4.2  1.7 - 7.7 (K/uL)   Lymphocytes Relative 31  12 - 46 (%)   Lymphs Abs 2.1  0.7 - 4.0 (K/uL)   Monocytes Relative 7  3 - 12 (%)   Monocytes Absolute 0.5  0.1 - 1.0 (K/uL)   Eosinophils Relative 2  0 - 5 (%)   Eosinophils Absolute 0.2  0.0 - 0.7 (K/uL)   Basophils Relative 0  0 - 1 (%)   Basophils Absolute 0.0  0.0 - 0.1 (K/uL)  BASIC METABOLIC PANEL      Component Value Range   Sodium 138  135 - 145 (mEq/L)   Potassium 4.2  3.5 - 5.1 (mEq/L)   Chloride 100  96 - 112 (mEq/L)   CO2 28  19 - 32 (mEq/L)   Glucose, Bld 211 (*) 70 - 99 (mg/dL)   BUN 14  6 - 23 (mg/dL)   Creatinine, Ser 7.82  0.50 - 1.10 (mg/dL)   Calcium 9.6  8.4 - 95.6 (mg/dL)   GFR calc non Af Amer 61 (*) >90 (mL/min)   GFR calc Af Amer 71 (*) >90 (mL/min)  POCT I-STAT TROPONIN I      Component Value Range   Troponin i, poc 0.01  0.00 - 0.08 (ng/mL)   Comment 3           TROPONIN I      Component Value Range   Troponin I <0.30  <0.30 (ng/mL)  PROTIME-INR      Component Value Range   Prothrombin Time 14.0  11.6 - 15.2 (seconds)   INR 1.06  0.00 - 1.49     Dg Chest Portable 1 View  12/13/2010  *RADIOLOGY REPORT*  Clinical Data: Chest pain, hypertension  PORTABLE CHEST - 1 VIEW  Comparison: Chest x-ray of 09/19/2008  Findings: There is an opacity within the right upper lung field overlying the anterior right  first rib.  This appears to be new and a developing lung lesion is a definite consideration.  CT of the chest may be helpful to assess further if warranted clinically. Otherwise the lungs are clear and hyperaerated.  Cardiomegaly is stable.  No acute bony abnormality is seen.  IMPRESSION:  1.  New nodular opacity in the right upper lung field.  Possible pneumonia but cannot exclude developing neoplasm.  Consider CT of the chest to assess further if warranted clinically. 2.  Cardiomegaly.  Original Report Authenticated By: Juline Patch, M.D.      Date: 12/13/2010 at 0915  Rate: 132  Rhythm: atrial fibrillation  QRS Axis: indeterminate  Intervals: normal  ST/T Wave abnormalities: nonspecific ST/T changes  Conduction Disutrbances:nonspecific intraventricular conduction delay  Narrative Interpretation:   Old EKG Reviewed: changes noted History of visual for relation but the old EKG from 06/08/2009 without evidence of atrial fibrillation.   Date: 12/13/2010 at 0930  Rate: 81  Rhythm: normal sinus rhythm  QRS Axis: normal  Intervals: normal  ST/T Wave abnormalities: nonspecific ST/T changes  Conduction Disutrbances:right bundle branch block and left anterior fascicular block  Narrative Interpretation:   Old EKG  Reviewed: changes noted Initial EKG of breath her heart rate with atrial for relation has resolved.      MDM   MDM: Chest x-ray results with right nodular density noted. Patient will most likely require admission for a chest pain cardiac rule out. If not we'll make sure patient also primary care provider regarding the chest x-ray. Discussed with [facility] patient's cardiologist, concurs that admission him needed for rule out chest pain particularly with the ST segment depression inferiorly during the rapid heart rate, initial troponin is normal, patient is rapid heart rate in atrial fib corrected itself spontaneously without any intervention, chest pain is now  gone as well. Is not clear if the chest pain went away when the heart rate improved. Patient with sniffing and cardiac risk factors with diabetes known hypertension and a history of atrial fibrillation, cardiology reports that the patient has had the perfusion studies that do show some coronary artery disease.   Hospitalist team will admit to telemetry [facility] will consult later today.     MEDICATIONS GIVEN IN THE E.D. Scheduled Meds:    . sodium chloride   Intravenous STAT  . carvedilol  25 mg Oral BID WC   Continuous Infusions:    . sodium chloride 100 mL/hr at 12/13/10 0937       IMPRESSION: 1. Chest pain   2. Atrial fibrillation with rapid ventricular response       I personally performed the services described in this documentation, which was scribed in my presence. The recorded information has been reviewed and considered.              Shelda Jakes, MD 12/13/10 1136

## 2010-12-14 LAB — CBC
Hemoglobin: 11.5 g/dL — ABNORMAL LOW (ref 12.0–15.0)
Platelets: 218 10*3/uL (ref 150–400)
RBC: 3.44 MIL/uL — ABNORMAL LOW (ref 3.87–5.11)
WBC: 5.2 10*3/uL (ref 4.0–10.5)

## 2010-12-14 LAB — CARDIAC PANEL(CRET KIN+CKTOT+MB+TROPI)
CK, MB: 2.6 ng/mL (ref 0.3–4.0)
CK, MB: 2.3 ng/mL (ref 0.3–4.0)
Relative Index: INVALID (ref 0.0–2.5)
Relative Index: INVALID (ref 0.0–2.5)
Total CK: 58 U/L (ref 7–177)
Troponin I: <0.3 ng/mL (ref <0.30)
Troponin I: <0.3 ng/mL (ref <0.30)

## 2010-12-14 LAB — COMPREHENSIVE METABOLIC PANEL
AST: 19 U/L (ref 0–37)
BUN: 10 mg/dL (ref 6–23)
CO2: 25 mEq/L (ref 19–32)
Calcium: 9 mg/dL (ref 8.4–10.5)
Creatinine, Ser: 0.75 mg/dL (ref 0.50–1.10)
GFR calc Af Amer: 88 mL/min — ABNORMAL LOW (ref >90)
GFR calc non Af Amer: 76 mL/min — ABNORMAL LOW (ref >90)
Glucose, Bld: 115 mg/dL — ABNORMAL HIGH (ref 70–99)
Total Bilirubin: 0.5 mg/dL (ref 0.3–1.2)

## 2010-12-14 LAB — TSH: TSH: 1.252 u[IU]/mL (ref 0.350–4.500)

## 2010-12-14 LAB — T4, FREE: Free T4: 0.96 ng/dL (ref 0.80–1.80)

## 2010-12-14 MED ORDER — LEVALBUTEROL TARTRATE 45 MCG/ACT IN AERO
1.0000 | INHALATION_SPRAY | Freq: Four times a day (QID) | RESPIRATORY_TRACT | Status: DC | PRN
Start: 2010-12-14 — End: 2011-03-15

## 2010-12-14 MED ORDER — AMIODARONE HCL 400 MG PO TABS: 400.0000 mg | ORAL_TABLET | Freq: Two times a day (BID) | ORAL | Status: DC

## 2010-12-14 NOTE — Progress Notes (Signed)
Discharge instructions and prescriptions given, verbalized understanding, out via w/c in stable condition with staff. 

## 2010-12-14 NOTE — Discharge Summary (Signed)
Physician Discharge Summary  Patient ID: Kathleen [name] MRN: [id number] DOB/AGE: 75-Jun-1926 75 y.o.  Admit date: 12/13/2010 Discharge date: 12/14/2010  Primary Care Physician:  Kirk Ruths, MD  Discharge Diagnoses:    .Hypertension .Lung nodule .Atrial fibrillation with RVR: Now rate controlled, patient declined anticoagulation  .DM type 2 (diabetes mellitus, type 2) .Hyperlipidemia Mild bronchitis  Consults:  Cardiology (Dr. Zoila Shutter)  Discharge Medications: Current Discharge Medication List    START taking these medications   Details  amiodarone (PACERONE) 400 MG tablet Take 1 tablet (400 mg total) by mouth 2 (two) times daily. Qty: 60 tablet, Refills: 1    levalbuterol (XOPENEX HFA) 45 MCG/ACT inhaler Inhale 1-2 puffs into the lungs every 6 (six) hours as needed for wheezing. Qty: 1 Inhaler, Refills: 12      CONTINUE these medications which have NOT CHANGED   Details  alendronate (FOSAMAX) 35 MG tablet Take 35 mg by mouth every 7 (seven) days. Take with a full glass of water on an empty stomach.     aspirin EC 81 MG tablet Take 81 mg by mouth daily.      calcium carbonate (OS-CAL - DOSED IN MG OF ELEMENTAL CALCIUM) 1250 MG tablet Take 1 tablet by mouth daily.      carvedilol (COREG) 25 MG tablet Take 25 mg by mouth 2 (two) times daily with a meal.      fish oil-omega-3 fatty acids 1000 MG capsule Take 1 g by mouth daily.      glyBURIDE-metformin (GLUCOVANCE) 2.5-500 MG per tablet Take 0.5 tablets by mouth daily with breakfast.      HYDROcodone-acetaminophen (VICODIN) 5-500 MG per tablet Take 1 tablet by mouth every 6 (six) hours as needed. pain     metFORMIN (GLUCOPHAGE) 500 MG tablet Take 500 mg by mouth at bedtime.      olmesartan (BENICAR) 40 MG tablet Take 40 mg by mouth 2 (two) times daily.      simvastatin (ZOCOR) 40 MG tablet Take 40 mg by mouth at bedtime.           Significant Diagnostic Studies:  Ct Chest W Contrast  12/13/2010   *RADIOLOGY REPORT*  Clinical Data: Right upper lobe lung opacity.  CT CHEST WITH CONTRAST  Technique:  Multidetector CT imaging of the chest was performed following the standard protocol during bolus administration of intravenous contrast.  Contrast:  80 ml Omnipaque-300.  Comparison: CT chest 10/29/2007.  Findings: The chest wall is unremarkable.  No breast masses, supraclavicular or axillary lymphadenopathy.  The bony thorax is intact.  No destructive bony lesions or spinal canal compromise. Moderate osteoporosis and mild exaggerated thoracic kyphosis.  The heart is normal in size for age.  No pericardial effusion.  No mediastinal or hilar lymphadenopathy.  There are small scattered stable lymph nodes.  The esophagus is grossly normal.  The thoracic aorta is normal in caliber.  Mild atherosclerotic changes.  Examination of the lung parenchyma demonstrates apical scarring changes likely accounting for the chest x-ray abnormality.  These appear relatively stable.  There is also extensive tree in bud appearance.  This is most typically seen with atypical infectious processes such as MAC.  This was also present on the prior study but is more extensive.  Small stable scattered airspace nodules. Small subpleural pulmonary nodules are again noted.  No worrisome mass lesions.  No pleural effusion.  No pulmonary edema.  No focal airspace consolidation.  The upper abdomen is unremarkable.  Splenic calcifications are noted.  IMPRESSION:  1.  Apical scarring changes, right greater than left likely account for the chest x-ray abnormality. 2.  Diffuse patchy tree in bud appearance, small nodular opacities and areas of scarring.  Findings most likely due to an atypical infectious process such as MAC. 3.  A follow-up chest CT in 4-6 months is recommended.  Original Report Authenticated By: P. Loralie Champagne, M.D.   Dg Chest Portable 1 View  12/13/2010  *RADIOLOGY REPORT*  Clinical Data: Chest pain, hypertension  PORTABLE  CHEST - 1 VIEW  Comparison: Chest x-ray of 09/19/2008  Findings: There is an opacity within the right upper lung field overlying the anterior right first rib.  This appears to be new and a developing lung lesion is a definite consideration.  CT of the chest may be helpful to assess further if warranted clinically. Otherwise the lungs are clear and hyperaerated.  Cardiomegaly is stable.  No acute bony abnormality is seen.  IMPRESSION:  1.  New nodular opacity in the right upper lung field.  Possible pneumonia but cannot exclude developing neoplasm.  Consider CT of the chest to assess further if warranted clinically. 2.  Cardiomegaly.  Original Report Authenticated By: Juline Patch, M.D.    Brief H and P: For complete details please refer to admission H and P, but in brief patient is 77 year old female with past medical history significant for paroxysmal atrial fibrillation, diabetes mellitus, hypertension presented to the ER with chest pain and palpitations. She felt her heart racing with palpitations offer she got up on the morning of admission. Shortly thereafter, she experienced substernal chest pain that radiated to her neck and throat. In the ER, initial telemetry strip revealed a heart rate of 133 beats per minute.    Hospital Course:  Paroxysmal atrial ablation with rapid ventricle response:  Patient was admitted to the medical service, at the time of admission the rhythm had already converted to normal sinus rhythm. Cardiology was consulted and patient was seen by Dr. Rennis Golden. Patient was placed on amiodarone 400 mg twice a day for 2 weeks then taper to 400 daily. Patient was continued on Coreg. LFTs were within normal limits. Patient was ruled out for acute ACS with negative cardiac enzymes. Patient refused Coumadin because of her history of hemarthrosis on Coumadin in the past. Patient was maintained on antiplatelet therapy with aspirin. She will follow up with cardiology within next 1-2 weeks. 2-D  echo was ordered but not done due to the weekend. Patient was strongly recommended to follow up with Dr. Adolm Joseph next week and obtain 2-D echo as an outpatient next week in the office. She had a recent stress test in March 2011 which demonstrated normal perfusion ejection fraction of 54%.  Active Problems:  Hypertension: Remained stable  Lung nodule: Chest x-ray showed a new or right upper lobe opacity, patient had pneumonia twice in the area previously. CT scan of the chest was obtained which showed apical scarring changes right greater than left with diffuse tree in bud appearance and areas of scarring. Patient was recommended a followup chest CT in 4-6 months.  DM type 2 (diabetes mellitus, type 2): Remained stable patient was continued on sliding scale insulin while inpatient Bronchitis: The patient was provided with when necessary Xopenex inhaler.    Day of Discharge BP 166/75  Pulse 66  Temp(Src) 98 F (36.7 C) (Oral)  Resp 18  Ht 5\' 8"  (1.727 m)  Wt 81.2 kg (179 lb 0.2 oz)  BMI 27.22 kg/m2  SpO2 93%  LAB RESULTS: Basic Metabolic Panel:  Lab 12/14/10 1914 12/13/10 1601 12/13/10 0909  NA 138 -- 138  K 4.1 -- 4.2  CL 104 -- 100  CO2 25 -- 28  GLUCOSE 115* -- 211*  BUN 10 -- 14  CREATININE 0.75 -- 0.85  CALCIUM 9.0 -- 9.6  MG -- 1.9 --  PHOS -- 3.3 --   Liver Function Tests:  Lab 12/14/10 0705  AST 19  ALT 15  ALKPHOS 70  BILITOT 0.5  PROT 5.9*  ALBUMIN 3.4*   CBC:  Lab 12/14/10 0705 12/13/10 0909  WBC 5.2 6.9  NEUTROABS -- 4.2  HGB 11.5* 13.3  HCT 33.6* 38.6  MCV 97.7 --  PLT 218 235   Cardiac Enzymes:  Lab 12/14/10 0705 12/13/10 2319  CKTOTAL 58 60  CKMB 2.3 2.6  CKMBINDEX -- --  TROPONINI <0.30 <0.30   CBG:  Lab 12/14/10 0729 12/13/10 2139  GLUCAP 119* 100*     Physical Exam: General: Alert and awake oriented x3 not in any acute distress. HEENT: anicteric sclera, pupils reactive to light and accommodation CVS: S1-S2 clear no murmur rubs  or gallops Chest: clear to auscultation bilaterally, no wheezing rales or rhonchi Abdomen: soft nontender, nondistended, normal bowel sounds, no organomegaly Extremities: no cyanosis, clubbing or edema noted bilaterally Neuro: Cranial nerves II-XII intact, no focal neurological deficits   Disposition and Follow-up: Discharge Orders    Future Orders Please Complete By Expires   Diet Carb Modified      Increase activity slowly      Discharge instructions      Comments:   Please take Amiodarone 400mg  twice a day for 2 weeks, then taper to daily       DISPOSITION: Home  DIET: carb modified  ACTIVITY: as tolerated  TESTS THAT NEED FOLLOW-UP 2-D echocardiogram in Dr. Blanchie Dessert office next week  DISCHARGE FOLLOW-UP Follow-up Information    Follow up with [facility]. Make an appointment in 2 weeks.   Contact information:   [address] [place] [address] [place] [address] [phone]       Follow up with [name],[name]. Make an appointment in 10 days. (or earlier  if symptoms worsen)    Contact information:   [address] [place] [address] [phone]          Time spent on Discharge: 45 minutes  Signed: RAI,RIPUDEEP 12/14/2010, 11:25 AM

## 2010-12-25 ENCOUNTER — Ambulatory Visit (HOSPITAL_COMMUNITY)
Admission: RE | Admit: 2010-12-25 | Discharge: 2010-12-25 | Disposition: A | Discharge status: Home / Self Care | Payer: Medicare Other | Source: Ambulatory Visit | Attending: Internal Medicine | Admitting: Internal Medicine

## 2010-12-25 DIAGNOSIS — R0602 Shortness of breath: Secondary | ICD-10-CM | POA: Insufficient documentation

## 2010-12-25 MED ORDER — ALBUTEROL SULFATE (5 MG/ML) 0.5% IN NEBU
2.5000 mg | INHALATION_SOLUTION | Freq: Once | RESPIRATORY_TRACT | Status: AC
  Administered 2010-12-25: 2.5 mg via RESPIRATORY_TRACT

## 2010-12-25 NOTE — Procedures (Signed)
Kathleen [name], BUXBAUM             ACCOUNT NO.:  [id number]  MEDICAL RECORD NO.:  [id number]  LOCATION:  RESP                          FACILITY:  [facility]  PHYSICIAN:  Latifah Padin L. Juanetta Gosling, M.D.DATE OF BIRTH:  February 26, 1924  DATE OF PROCEDURE: DATE OF DISCHARGE:                           PULMONARY FUNCTION TEST   Patient of Dr. Iantha Fallen.  Reason for pulmonary function testing is shortness of breath.  1. Spirometry shows a moderate ventilatory defect with airflow     obstruction.  2.  Lung volumes are normal. 2. DLCO is moderately reduced, but does correct somewhat when volume     corrections are applied. 3. There is no significant bronchodilator improvement. 4. This study is consistent with airflow obstruction which may be the     cause of shortness of breath.     Faust Thorington L. Juanetta Gosling, M.D.     ELH/MEDQ  D:  12/25/2010  T:  12/25/2010  Job:  [id number]  cc:   Dr. Zoila Shutter

## 2011-03-15 ENCOUNTER — Emergency Department (HOSPITAL_COMMUNITY): Payer: Medicare Other

## 2011-03-15 ENCOUNTER — Other Ambulatory Visit: Payer: Self-pay

## 2011-03-15 ENCOUNTER — Encounter (HOSPITAL_COMMUNITY): Payer: Self-pay | Admitting: *Deleted

## 2011-03-15 ENCOUNTER — Emergency Department (HOSPITAL_COMMUNITY)
Admission: EM | Admit: 2011-03-15 | Discharge: 2011-03-15 | Disposition: A | Discharge status: Home / Self Care | Payer: Medicare Other | Attending: Emergency Medicine | Admitting: Emergency Medicine

## 2011-03-15 DIAGNOSIS — R42 Dizziness and giddiness: Secondary | ICD-10-CM | POA: Insufficient documentation

## 2011-03-15 DIAGNOSIS — Z79899 Other long term (current) drug therapy: Secondary | ICD-10-CM | POA: Insufficient documentation

## 2011-03-15 DIAGNOSIS — W06XXXA Fall from bed, initial encounter: Secondary | ICD-10-CM | POA: Insufficient documentation

## 2011-03-15 DIAGNOSIS — W19XXXA Unspecified fall, initial encounter: Secondary | ICD-10-CM

## 2011-03-15 DIAGNOSIS — E119 Type 2 diabetes mellitus without complications: Secondary | ICD-10-CM | POA: Insufficient documentation

## 2011-03-15 DIAGNOSIS — IMO0002 Reserved for concepts with insufficient information to code with codable children: Secondary | ICD-10-CM | POA: Insufficient documentation

## 2011-03-15 DIAGNOSIS — I4891 Unspecified atrial fibrillation: Secondary | ICD-10-CM | POA: Insufficient documentation

## 2011-03-15 DIAGNOSIS — I10 Essential (primary) hypertension: Secondary | ICD-10-CM | POA: Insufficient documentation

## 2011-03-15 DIAGNOSIS — Z7982 Long term (current) use of aspirin: Secondary | ICD-10-CM | POA: Insufficient documentation

## 2011-03-15 DIAGNOSIS — I2510 Atherosclerotic heart disease of native coronary artery without angina pectoris: Secondary | ICD-10-CM | POA: Insufficient documentation

## 2011-03-15 DIAGNOSIS — S1093XA Contusion of unspecified part of neck, initial encounter: Secondary | ICD-10-CM | POA: Insufficient documentation

## 2011-03-15 DIAGNOSIS — S0003XA Contusion of scalp, initial encounter: Secondary | ICD-10-CM | POA: Insufficient documentation

## 2011-03-15 DIAGNOSIS — T148XXA Other injury of unspecified body region, initial encounter: Secondary | ICD-10-CM

## 2011-03-15 DIAGNOSIS — E789 Disorder of lipoprotein metabolism, unspecified: Secondary | ICD-10-CM | POA: Insufficient documentation

## 2011-03-15 DIAGNOSIS — R51 Headache: Secondary | ICD-10-CM | POA: Insufficient documentation

## 2011-03-15 NOTE — ED Provider Notes (Signed)
History  Scribed for [employer]. Colon Branch, MD, the patient was seen in room APA04/APA04. This chart was scribed by Candelaria Stagers. The patient's care started at 7:30 AM    CSN: [id number]  Arrival date & time 03/15/11  0709   First MD Initiated Contact with Patient 03/15/11 0719      Chief Complaint  Patient presents with  . fall/lsb      Patient is a 78 y.o. female presenting with fall.  Fall The accident occurred 6 to 12 hours ago. The fall occurred from a bed. The volume of blood lost was minimal. The point of impact was the head. The pain is present in the head. The pain is mild. She was not ambulatory at the scene. There was no drug use involved in the accident. There was no alcohol use involved in the accident. Associated symptoms include headaches. Treatment on scene includes a c-collar and a backboard. She has tried nothing for the symptoms. The treatment provided no relief.   Kathleen [name] is a 78 y.o. female who was BIBA on back board and in C-collar to the Emergency Department after experiencing a fall and LOC.  She reports that was getting up to use the bathroom when she experienced dizziness and fell.  She hit her forehead on the door.  She is not sure if she experienced LOC.  She does not use a can or walker at baseline.  She has a h/o atrial fibulation and takes asa.  Pt currently lives with her daughter.  Her PCP is Dr. Regino Schultze.        Past Medical History  Diagnosis Date  . Hypertension   . Diabetes mellitus   . High cholesterol   . Atrial fibrillation   . Paroxysmal a-fib 12/13/2010  . PNA (pneumonia)   . CAD (coronary artery disease) 2006    Past Surgical History  Procedure Date  . Replacement total knee   . Bladder tact     No family history on file.  History  Substance Use Topics  . Smoking status: Never Smoker   . Smokeless tobacco: Not on file  . Alcohol Use: No    OB History    Grav Para Term Preterm Abortions TAB SAB Ect Mult Living           Review of Systems  HENT:       Abrasion to the right forehead.   Musculoskeletal: Negative for back pain.  Neurological: Positive for dizziness and headaches.  All other systems reviewed and are negative.    Allergies  Morphine and Sulfonamide derivatives  Home Medications   Current Outpatient Rx  Name Route Sig Dispense Refill  . ALENDRONATE SODIUM 35 MG PO TABS Oral Take 35 mg by mouth every 7 (seven) days. Take with a full glass of water on an empty stomach.     . AMIODARONE HCL 400 MG PO TABS Oral Take 1 tablet (400 mg total) by mouth 2 (two) times daily. 60 tablet 1    Take 400mg  BID x 2 weeks, then taper to 400 mg onc ...  . ASPIRIN EC 81 MG PO TBEC Oral Take 81 mg by mouth daily.      Marland Kitchen CALCIUM CARBONATE 1250 MG PO TABS Oral Take 1 tablet by mouth daily.      Marland Kitchen CARVEDILOL 25 MG PO TABS Oral Take 25 mg by mouth 2 (two) times daily with a meal.      . OMEGA-3 FATTY ACIDS 1000 MG  PO CAPS Oral Take 1 g by mouth daily.      . GLYBURIDE-METFORMIN 2.5-500 MG PO TABS Oral Take 0.5 tablets by mouth daily with breakfast.      . HYDROCODONE-ACETAMINOPHEN 5-500 MG PO TABS Oral Take 1 tablet by mouth every 6 (six) hours as needed. pain     . LEVALBUTEROL TARTRATE 45 MCG/ACT IN AERO Inhalation Inhale 1-2 puffs into the lungs every 6 (six) hours as needed for wheezing. 1 Inhaler 12  . METFORMIN HCL 500 MG PO TABS Oral Take 500 mg by mouth at bedtime.      Marland Kitchen OLMESARTAN MEDOXOMIL 40 MG PO TABS Oral Take 40 mg by mouth 2 (two) times daily.      Marland Kitchen SIMVASTATIN 40 MG PO TABS Oral Take 40 mg by mouth at bedtime.        BP 204/80  Pulse 74  Temp(Src) 98.4 F (36.9 C) (Oral)  Resp 20  Ht 5\' 8"  (1.727 m)  Wt 178 lb (80.74 kg)  BMI 27.06 kg/m2  SpO2 94%  Physical Exam  Constitutional: She is oriented to person, place, and time. She appears well-developed and well-nourished. No distress.  HENT:  Mouth/Throat: Oropharynx is clear and moist. No oropharyngeal exudate.       2cmx2cm  abrasion to the right forehead.  Underlying small hematoma, no boney abnormalities  Eyes: Pupils are equal, round, and reactive to light. Right eye exhibits no discharge. Left eye exhibits no discharge.  Neck: Normal range of motion. Neck supple.  Cardiovascular: Exam reveals no gallop.   No murmur heard.      Irregular rhythm.   Pulmonary/Chest: Effort normal. She has no wheezes. She has no rales.  Musculoskeletal: Normal range of motion. She exhibits no edema and no tenderness.  Neurological: She is alert and oriented to person, place, and time.  Skin: Skin is warm and dry. She is not diaphoretic.  Psychiatric: She has a normal mood and affect. Her behavior is normal.    ED Course  Procedures   DIAGNOSTIC STUDIES: Oxygen Saturation is 94% on room air, normal by my interpretation.      Date: 03/15/2011  0746  Rate: 75  Rhythm: normal sinus rhythm  QRS Axis: right  Intervals: normal  ST/T Wave abnormalities: normal  Conduction Disutrbances:none  Narrative Interpretation:   Old EKG Reviewed: unchanged c/w 12/14/10  COORDINATION OF CARE:  7:36AM Ordered: ED EKG; CT Head Wo Contrast ; CT Cervical Spine Wo Contrast    Ct Head Wo Contrast  03/15/2011  *RADIOLOGY REPORT*  Clinical Data:  fall. DIZZINESS, STRUCK HEAD  CT HEAD WITHOUT CONTRAST CT CERVICAL SPINE WITHOUT CONTRAST  Technique:  Multidetector CT imaging of the head and cervical spine was performed following the standard protocol without IV contrast. Multiplanar CT image reconstructions of the cervical spine were also generated.  Comparison: None  CT HEAD  Findings: There is a large right frontal scalp hematoma.  Diffuse mucoperiosteal thickening in bilateral maxillary sinuses, ethmoid air cells, frontal sinuses, and left sphenoid sinus. Diffuse parenchymal atrophy. Patchy areas of hypoattenuation in deep and periventricular white matter bilaterally. Negative for acute intracranial hemorrhage, mass lesion, acute infarction,  midline shift, or mass-effect. Acute infarct may be inapparent on noncontrast CT. Ventricles and sulci symmetric. Bone windows demonstrate no focal lesion.  IMPRESSION:  1. Negative for bleed or other acute intracranial process.  2. Atrophy and nonspecific white matter changes. 3.  Right frontal scalp hematoma. 4.  Sinusitis.  CT CERVICAL SPINE  Findings:  There is narrowing of interspaces from C3-C7.  Normal alignment.  Facets seated.  No prevertebral soft tissue swelling. Negative for fracture.  Posterior disc protrusions with   endplate osteophytes at C3-4 on the right, C4-5 on the left, C5-6 on the left.  Facet uncovertebral hypertrophy results in foraminal encroachment C5-6, left greater than right.  There are coarse parenchymal opacities in the visualized lung apices.  There is bilateral calcified carotid bifurcation plaque.  IMPRESSION:  1.  Negative for fracture or other acute bony abnormality. 2.  Multilevel degenerative changes as above. 3.  Bilateral carotid bifurcation plaque.  Original Report Authenticated By: Osa Craver, M.D.   Ct Cervical Spine Wo Contrast  03/15/2011  *RADIOLOGY REPORT*  Clinical Data:  fall. DIZZINESS, STRUCK HEAD  CT HEAD WITHOUT CONTRAST CT CERVICAL SPINE WITHOUT CONTRAST  Technique:  Multidetector CT imaging of the head and cervical spine was performed following the standard protocol without IV contrast. Multiplanar CT image reconstructions of the cervical spine were also generated.  Comparison: None  CT HEAD  Findings: There is a large right frontal scalp hematoma.  Diffuse mucoperiosteal thickening in bilateral maxillary sinuses, ethmoid air cells, frontal sinuses, and left sphenoid sinus. Diffuse parenchymal atrophy. Patchy areas of hypoattenuation in deep and periventricular white matter bilaterally. Negative for acute intracranial hemorrhage, mass lesion, acute infarction, midline shift, or mass-effect. Acute infarct may be inapparent on noncontrast CT.  Ventricles and sulci symmetric. Bone windows demonstrate no focal lesion.  IMPRESSION:  1. Negative for bleed or other acute intracranial process.  2. Atrophy and nonspecific white matter changes. 3.  Right frontal scalp hematoma. 4.  Sinusitis.  CT CERVICAL SPINE  Findings: There is narrowing of interspaces from C3-C7.  Normal alignment.  Facets seated.  No prevertebral soft tissue swelling. Negative for fracture.  Posterior disc protrusions with   endplate osteophytes at C3-4 on the right, C4-5 on the left, C5-6 on the left.  Facet uncovertebral hypertrophy results in foraminal encroachment C5-6, left greater than right.  There are coarse parenchymal opacities in the visualized lung apices.  There is bilateral calcified carotid bifurcation plaque.  IMPRESSION:  1.  Negative for fracture or other acute bony abnormality. 2.  Multilevel degenerative changes as above. 3.  Bilateral carotid bifurcation plaque.  Original Report Authenticated By: Thora Lance III, M.D.       MDM  Patient who fell at home hitting her head on the edge of the sink in the bathroom. ? LOC. Cts negative for acute changes. Pt feels improved after observation and/or treatment in ED.Pt stable in ED with no significant deterioration in condition.The patient appears reasonably screened and/or stabilized for discharge and I doubt any other medical condition or other [facility] requiring further screening, evaluation, or treatment in the ED at this time prior to discharge.  I personally performed the services described in this documentation, which was scribed in my presence. The recorded information has been reviewed and considered.  MDM Reviewed: nursing note and vitals Interpretation: CT scan         Nicoletta Dress. Colon Branch, MD 03/15/11 [phone]

## 2011-03-15 NOTE — ED Notes (Signed)
NT assisted pt with female urinal.

## 2011-03-15 NOTE — Discharge Instructions (Signed)
APPLY ICE FOR SWELLING. YOU MAY USE TYLENOL OR IBUPROFEN FOR DISCOMFORT.    Abrasions An abrasion is a scraped area on the skin. Abrasions do not go through all layers of the skin.  HOME CARE  Change any bandages (dressings) as told by your doctor. If the bandage sticks, soak it off with warm, soapy water. Change the bandage if it gets wet, dirty, or starts to smell.   Wash the area with soap and water twice a day. Rinse off the soap. Pat the area dry with a clean towel.   Look at the injured area for signs of infection. Infection signs include redness, puffiness (swelling), tenderness, or yellowish white fluid (pus) coming from the wound.   Apply medicated cream as told by your doctor.   Only take medicine as told by your doctor.   Follow up with your doctor as told.  GET HELP RIGHT AWAY IF:   You have more pain in your wound.   You have redness, puffiness (swelling), or tenderness around your wound.   You have yellowish white fluid (pus) coming from your wound.   You have a fever.   A bad smell is coming from the wound or bandage.  MAKE SURE YOU:   Understand these instructions.   Will watch your condition.   Will get help right away if you are not doing well or get worse.  Document Released: 06/18/2007 Document Revised: 09/11/2010 Document Reviewed: 06/18/2007 [facility] Patient Information 2012 [place], [place].Abrasions An abrasion is a scraped area on the skin. Abrasions do not go through all layers of the skin.  HOME CARE  Change any bandages (dressings) as told by your doctor. If the bandage sticks, soak it off with warm, soapy water. Change the bandage if it gets wet, dirty, or starts to smell.   Wash the area with soap and water twice a day. Rinse off the soap. Pat the area dry with a clean towel.   Look at the injured area for signs of infection. Infection signs include redness, puffiness (swelling), tenderness, or yellowish white fluid (pus) coming from the  wound.   Apply medicated cream as told by your doctor.   Only take medicine as told by your doctor.   Follow up with your doctor as told.  GET HELP RIGHT AWAY IF:   You have more pain in your wound.   You have redness, puffiness (swelling), or tenderness around your wound.   You have yellowish white fluid (pus) coming from your wound.   You have a fever.   A bad smell is coming from the wound or bandage.  MAKE SURE YOU:   Understand these instructions.   Will watch your condition.   Will get help right away if you are not doing well or get worse.  Document Released: 06/18/2007 Document Revised: 09/11/2010 Document Reviewed: 06/18/2007 [facility] Patient Information 2012 [place], [place].

## 2011-03-15 NOTE — ED Notes (Signed)
Pt up to bathroom via w/c.  C/o upper back pain between shoulder blades worse with deep breath.  edp notified.

## 2011-03-15 NOTE — ED Notes (Signed)
Per EMS - pt was going to bathroom, got dizzy falling, hit forehead on door.  Pt reports being knocked unconscious for unknown amt of time.  Pt alert and oriented at this time.  Pt has small knot to right forehead approx size of golf ball, bleeding controlled.  Pt in c-collar and lsb.  C/o pain to head.  CBG en route was 131.

## 2011-03-23 ENCOUNTER — Emergency Department (HOSPITAL_COMMUNITY): Payer: Medicare Other

## 2011-03-23 ENCOUNTER — Inpatient Hospital Stay (HOSPITAL_COMMUNITY)
Admission: EM | Admit: 2011-03-23 | Discharge: 2011-03-26 | DRG: 244 | Disposition: A | Discharge status: Home / Self Care | Payer: Medicare Other | Source: Ambulatory Visit | Attending: Cardiology | Admitting: Cardiology

## 2011-03-23 ENCOUNTER — Other Ambulatory Visit: Payer: Self-pay

## 2011-03-23 ENCOUNTER — Encounter (HOSPITAL_COMMUNITY): Payer: Self-pay

## 2011-03-23 DIAGNOSIS — I441 Atrioventricular block, second degree: Principal | ICD-10-CM | POA: Diagnosis present

## 2011-03-23 DIAGNOSIS — R55 Syncope and collapse: Secondary | ICD-10-CM | POA: Diagnosis not present

## 2011-03-23 DIAGNOSIS — J9801 Acute bronchospasm: Secondary | ICD-10-CM

## 2011-03-23 DIAGNOSIS — J449 Chronic obstructive pulmonary disease, unspecified: Secondary | ICD-10-CM | POA: Diagnosis present

## 2011-03-23 DIAGNOSIS — Z96659 Presence of unspecified artificial knee joint: Secondary | ICD-10-CM

## 2011-03-23 DIAGNOSIS — I4891 Unspecified atrial fibrillation: Secondary | ICD-10-CM | POA: Diagnosis present

## 2011-03-23 DIAGNOSIS — Z7982 Long term (current) use of aspirin: Secondary | ICD-10-CM

## 2011-03-23 DIAGNOSIS — E119 Type 2 diabetes mellitus without complications: Secondary | ICD-10-CM | POA: Diagnosis present

## 2011-03-23 DIAGNOSIS — J4489 Other specified chronic obstructive pulmonary disease: Secondary | ICD-10-CM | POA: Diagnosis present

## 2011-03-23 DIAGNOSIS — I10 Essential (primary) hypertension: Secondary | ICD-10-CM | POA: Diagnosis present

## 2011-03-23 DIAGNOSIS — I480 Paroxysmal atrial fibrillation: Secondary | ICD-10-CM | POA: Diagnosis present

## 2011-03-23 DIAGNOSIS — I2510 Atherosclerotic heart disease of native coronary artery without angina pectoris: Secondary | ICD-10-CM | POA: Diagnosis present

## 2011-03-23 DIAGNOSIS — E785 Hyperlipidemia, unspecified: Secondary | ICD-10-CM | POA: Diagnosis present

## 2011-03-23 LAB — CBC
MCV: 98.6 fL (ref 78.0–100.0)
Platelets: 346 10*3/uL (ref 150–400)
RBC: 3.58 MIL/uL — ABNORMAL LOW (ref 3.87–5.11)
RDW: 13.2 % (ref 11.5–15.5)
WBC: 9.4 10*3/uL (ref 4.0–10.5)

## 2011-03-23 LAB — BASIC METABOLIC PANEL
CO2: 28 mEq/L (ref 19–32)
Calcium: 9.8 mg/dL (ref 8.4–10.5)
Glucose, Bld: 141 mg/dL — ABNORMAL HIGH (ref 70–99)
Potassium: 3.7 mEq/L (ref 3.5–5.1)
Sodium: 133 mEq/L — ABNORMAL LOW (ref 135–145)

## 2011-03-23 LAB — DIFFERENTIAL
Basophils Absolute: 0 10*3/uL (ref 0.0–0.1)
Eosinophils Relative: 3 % (ref 0–5)
Lymphocytes Relative: 23 % (ref 12–46)
Lymphs Abs: 2.1 10*3/uL (ref 0.7–4.0)
Neutro Abs: 6.1 10*3/uL (ref 1.7–7.7)
Neutrophils Relative %: 65 % (ref 43–77)

## 2011-03-23 LAB — APTT: aPTT: 29 seconds (ref 24–37)

## 2011-03-23 LAB — PROTIME-INR: Prothrombin Time: 14.3 seconds (ref 11.6–15.2)

## 2011-03-23 MED ORDER — POTASSIUM CHLORIDE IN NACL 20-0.9 MEQ/L-% IV SOLN
INTRAVENOUS | Status: DC
  Administered 2011-03-23: 1000 mL via INTRAVENOUS
  Filled 2011-03-23 (×3): qty 1000

## 2011-03-23 MED ORDER — IPRATROPIUM BROMIDE 0.02 % IN SOLN
0.5000 mg | Freq: Once | RESPIRATORY_TRACT | Status: AC
  Administered 2011-03-23: 0.5 mg via RESPIRATORY_TRACT
  Filled 2011-03-23: qty 2.5

## 2011-03-23 MED ORDER — TRAZODONE 25 MG HALF TABLET
25.0000 mg | ORAL_TABLET | Freq: Every evening | ORAL | Status: DC | PRN
  Filled 2011-03-23: qty 1

## 2011-03-23 MED ORDER — ALBUTEROL SULFATE (5 MG/ML) 0.5% IN NEBU
2.5000 mg | INHALATION_SOLUTION | Freq: Once | RESPIRATORY_TRACT | Status: AC
  Administered 2011-03-23: 2.5 mg via RESPIRATORY_TRACT
  Filled 2011-03-23: qty 0.5

## 2011-03-23 MED ORDER — AMIODARONE HCL 200 MG PO TABS
200.0000 mg | ORAL_TABLET | Freq: Two times a day (BID) | ORAL | Status: DC
  Administered 2011-03-23 – 2011-03-26 (×6): 200 mg via ORAL
  Filled 2011-03-23 (×8): qty 1

## 2011-03-23 NOTE — ED Notes (Signed)
Pt with recent dizzyness and syncopal episodes with falls, is on a holter monitor for same.  Pt states tonight she had a feeling of her heart "pounding and beating fast"  And came because in the past she has passed out from same.   Pt denies pain at this time,

## 2011-03-23 NOTE — ED Notes (Signed)
Pt heart rate increased to 72 at this time.

## 2011-03-23 NOTE — ED Notes (Signed)
Respiratory paged for a breathing treatment at this time.  

## 2011-03-23 NOTE — ED Notes (Signed)
Pt assisted to bedside commode

## 2011-03-23 NOTE — ED Notes (Signed)
Pt dropped back into a sinus brady. Pt hooked back up to pacing pads at this time.

## 2011-03-23 NOTE — ED Provider Notes (Signed)
History   This chart was scribed for Kathleen Booze, MD by Melba Coon. The patient was seen in room APA06/APA06 and the patient's care was started at 8:09PM.    CSN: [id number]  Arrival date & time 03/23/11  1936   First MD Initiated Contact with Patient 03/23/11 1955      Chief Complaint  Patient presents with  . Chest Pain    (Consider location/radiation/quality/duration/timing/severity/associated sxs/prior treatment) HPI Kathleen [name] is a 78 y.o. female who presents to the Emergency Department complaining of constant, moderate to severe pounding feeling in her chest with an onset an hr ago. She says that she could feel her heart beating. He was not fast and was not skipping beats. Pt was sitting down when it occurred and was concerned because she has had similar episodes in the past which was coupled with syncope. Pt had recent episode of syncope and fall a week ago, no LOC, but with head contact. No syncope with this episode of tachycardia. No HA, neck pain, CP, SOB, abd pain, n/v/d, or extremity pain, edema, numbness, or tingling. Hx of HTN, diabetes mellitus, hypercholesteremia, and CAD. Allergic to Morphine and Sulfonamides. No other pertinent medical symptoms. Pt is not a smoker.  PCP: Dr. Regino Schultze  Past Medical History  Diagnosis Date  . Hypertension   . Diabetes mellitus   . High cholesterol   . Atrial fibrillation   . Paroxysmal a-fib 12/13/2010  . PNA (pneumonia)   . CAD (coronary artery disease) 2006    Past Surgical History  Procedure Date  . Replacement total knee   . Bladder tact     No family history on file.  History  Substance Use Topics  . Smoking status: Never Smoker   . Smokeless tobacco: Not on file  . Alcohol Use: No    OB History    Grav Para Term Preterm Abortions TAB SAB Ect Mult Living                  Review of Systems 10 Systems reviewed and are negative for acute change except as noted in the HPI.  Allergies  Morphine and  Sulfonamide derivatives  Home Medications   Current Outpatient Rx  Name Route Sig Dispense Refill  . ALENDRONATE SODIUM 35 MG PO TABS Oral Take 35 mg by mouth every 7 (seven) days. Take with a full glass of water on an empty stomach.patient takes on Sunday    . AMIODARONE HCL 200 MG PO TABS Oral Take 200 mg by mouth 2 (two) times daily.     . ASPIRIN EC 325 MG PO TBEC Oral Take 325 mg by mouth daily.    Marland Kitchen CARVEDILOL 6.25 MG PO TABS Oral Take 6.25 mg by mouth 2 (two) times daily.    Marland Kitchen CEFDINIR 300 MG PO CAPS Oral Take 300 mg by mouth 2 (two) times daily. Patient started on 03/11/11    . GLYBURIDE-METFORMIN 2.5-500 MG PO TABS Oral Take 0.5 tablets by mouth daily with breakfast.     . METFORMIN HCL 500 MG PO TABS Oral Take 500 mg by mouth at bedtime.     . CENTRUM SILVER PO Oral Take 1 tablet by mouth daily.    Marland Kitchen SIMVASTATIN 40 MG PO TABS Oral Take 40 mg by mouth at bedtime.        BP 208/66  Pulse 72  Temp(Src) 98.2 F (36.8 C) (Oral)  Resp 19  Ht 5\' 8"  (1.727 m)  Wt 175 lb (79.379  kg)  BMI 26.61 kg/m2  SpO2 99%  Physical Exam  Nursing note and vitals reviewed. Constitutional: She appears well-developed and well-nourished.       Awake, alert, nontoxic appearance.  HENT:  Head: Atraumatic.       Ecchymosis on rt side of forehead c/w previous fall last weekend  Eyes: Right eye exhibits no discharge. Left eye exhibits no discharge.  Neck: Neck supple.  Cardiovascular: Bradycardia present.   Pulmonary/Chest: Effort normal. She has wheezes (Diffuse). She has rales (Scattered). She exhibits no tenderness.  Abdominal: Soft. There is no tenderness. There is no rebound.  Musculoskeletal: She exhibits no tenderness.       Baseline ROM, no obvious new focal weakness.  Neurological:       Mental status and motor strength appears baseline for patient and situation.  Skin: No rash noted.  Psychiatric: She has a normal mood and affect.    ED Course  Procedures (including critical care  time)  DIAGNOSTIC STUDIES: Oxygen Saturation is 95% on room air, adequate by my interpretation.    COORDINATION OF CARE:  Results for orders placed during the hospital encounter of 03/23/11  CBC      Component Value Range   WBC 9.4  4.0 - 10.5 (K/uL)   RBC 3.58 (*) 3.87 - 5.11 (MIL/uL)   Hemoglobin 11.9 (*) 12.0 - 15.0 (g/dL)   HCT 09.8 (*) 11.9 - 46.0 (%)   MCV 98.6  78.0 - 100.0 (fL)   MCH 33.2  26.0 - 34.0 (pg)   MCHC 33.7  30.0 - 36.0 (g/dL)   RDW 14.7  82.9 - 56.2 (%)   Platelets 346  150 - 400 (K/uL)  DIFFERENTIAL      Component Value Range   Neutrophils Relative 65  43 - 77 (%)   Neutro Abs 6.1  1.7 - 7.7 (K/uL)   Lymphocytes Relative 23  12 - 46 (%)   Lymphs Abs 2.1  0.7 - 4.0 (K/uL)   Monocytes Relative 9  3 - 12 (%)   Monocytes Absolute 0.9  0.1 - 1.0 (K/uL)   Eosinophils Relative 3  0 - 5 (%)   Eosinophils Absolute 0.3  0.0 - 0.7 (K/uL)   Basophils Relative 0  0 - 1 (%)   Basophils Absolute 0.0  0.0 - 0.1 (K/uL)  BASIC METABOLIC PANEL      Component Value Range   Sodium 133 (*) 135 - 145 (mEq/L)   Potassium 3.7  3.5 - 5.1 (mEq/L)   Chloride 94 (*) 96 - 112 (mEq/L)   CO2 28  19 - 32 (mEq/L)   Glucose, Bld 141 (*) 70 - 99 (mg/dL)   BUN 15  6 - 23 (mg/dL)   Creatinine, Ser 1.30  0.50 - 1.10 (mg/dL)   Calcium 9.8  8.4 - 86.5 (mg/dL)   GFR calc non Af Amer 51 (*) >90 (mL/min)   GFR calc Af Amer 59 (*) >90 (mL/min)    Dg Chest Portable 1 View  03/23/2011  *RADIOLOGY REPORT*  Clinical Data: Chest pain  PORTABLE CHEST - 1 VIEW  Comparison: 03/11/2011  Findings: Severe cardiomegaly.  Pulmonary vascularity is within normal limits.  Chronic changes in the upper lung zones.  No definite consolidation.  No pneumothorax.  IMPRESSION: Cardiomegaly without edema.  Chronic changes.  Original Report Authenticated By: Donavan Burnet, M.D.    Date: 03/23/2011 1839  Rate: 76  Rhythm: normal sinus rhythm  QRS Axis: right  Intervals: normal  ST/T Wave  abnormalities: inverted  T waves in the inferior leads  Conduction Disutrbances:right bundle branch block and left posterior fascicular block  Narrative Interpretation: Right bundle branch block with left posterior fascicular block. When compared with ECG of 03/15/2011, no significant changes are noted.  Old EKG Reviewed: unchanged   Date: 03/23/2011 1845  Rate: 38  Rhythm: 2:1 AV block  QRS Axis: right  Intervals: normal  ST/T Wave abnormalities: inverted T waves in the inferior leads  Conduction Disutrbances:right bundle branch block and left posterior fascicular block  Narrative Interpretation: When compared with ECG earlier today, 2:1 AV block is now present.  Old EKG Reviewed: changes noted  She was given an albuterol with Atrovent nebulizer treatment with improvement in her lungs. Heart rhythm continued to vary between 2:1 and 1:1 AV conduction. Case is discussed with Dr. Herbie Baltimore of [facility] who agrees to accept her at [facility], but she will be admitted to the hospitalist service tonight. I have contacted Dr. Orvan Falconer who has come to do her admitting history and physical.  1. Second degree AV block   2. Bronchospasm       MDM  Her heart rhythm is fluctuating between 2-1 AV block and sinus rhythm. Episodes of AV block could have contributed to her recent fall. It is noted that she is on a beta blocker as well as amiodarone. Heart block might be related to the medications. She will need to be admitted for cardiac monitoring while the medications clear her system. If heart block persists after medications have cleared her system, she may need a permanent pacemaker.   I personally performed the services described in this documentation, which was scribed in my presence. The recorded information has been reviewed and considered.        Kathleen Booze, MD 03/24/11 [phone]

## 2011-03-23 NOTE — H&P (Signed)
PCP:   Kirk Ruths, MD, MD   Cardiologist:  Dr. Elio Forget, [facility] and V[facility]  Chief Complaint:  Weakness and "pounding" of her heart since this afternoon  HPI: Kathleen [name] is an 78 y.o. female.   Multiple medical problems including diabetes, hypertension, paroxysmal atrial fibrillation, for which she was started on rate controlling drugs amiodarone, Coreg, and high-dose aspirin, over 2 months ago by Dr. Rennis Golden.  Patient was seen in the emergency room on March 2 her syncopal episode associated with sudden standing-up, and after treatment for extensive bruises to her face she followed up with her cardiologist on March 6. Her Coreg was reduced by half to 6.25 mg twice and she continues to wear a cardiac monitor.  This afternoon patient began to feel very weak and she noted her heart is slow and pounding in her chest, she measured it at 38 beats per minute and therefore came to the emergency room because she felt she would be having a syncopal episode. In the emergency room she was noted to be in second-degree heart block and a consult was called so Dr. Clemon Chambers on call for [facility]. They declined admission but recommended the patient be transferred to [facility] for admission by the hospitalist service with a consult to [facility] heart and vascular. He recommended discontinuing the Coreg continuing the amiodarone and indicated that the likelihood that they will take over the patient's care in the morning.  Kathleen [name] has been resting in the emergency room and now feels much  better   She denies fever cough or cold chest pains or shortness of breath  Fortunately, she is not on warfarin  Rewiew of Systems:  The patient denies anorexia, fever, weight loss,, vision loss, , hoarseness, chest pain, dyspnea on exertion, peripheral edema, balance deficits, hemoptysis, abdominal pain, melena, hematochezia, severe indigestion/heartburn, hematuria, incontinence, genital sores,  muscle weakness, suspicious skin lesions, transient blindness, difficulty walking, depression, unusual weight change, abnormal bleeding, enlarged lymph nodes, angioedema, and breast masses.    Past Medical History  Diagnosis Date  . Hypertension   . Diabetes mellitus   . High cholesterol   . Atrial fibrillation   . Paroxysmal a-fib 12/13/2010  . PNA (pneumonia)   . CAD (coronary artery disease) 2006    Past Surgical History  Procedure Date  . Replacement total knee   . Bladder tact     Medications:  HOME MEDS: Prior to Admission medications   Medication Sig Start Date End Date Taking? Authorizing Provider  alendronate (FOSAMAX) 35 MG tablet Take 35 mg by mouth every 7 (seven) days. Take with a full glass of water on an empty stomach.patient takes on Sunday   Yes Historical Provider, MD  amiodarone (PACERONE) 200 MG tablet Take 200 mg by mouth 2 (two) times daily.    Yes Historical Provider, MD  aspirin EC 325 MG tablet Take 325 mg by mouth daily.   Yes Historical Provider, MD  carvedilol (COREG) 6.25 MG tablet Take 6.25 mg by mouth 2 (two) times daily.   Yes Historical Provider, MD  cefdinir (OMNICEF) 300 MG capsule Take 300 mg by mouth 2 (two) times daily. Patient started on 03/11/11   Yes Historical Provider, MD  glyBURIDE-metformin (GLUCOVANCE) 2.5-500 MG per tablet Take 0.5 tablets by mouth daily with breakfast.    Yes Historical Provider, MD  metFORMIN (GLUCOPHAGE) 500 MG tablet Take 500 mg by mouth at bedtime.    Yes Historical Provider, MD  Multiple Vitamins-Minerals (CENTRUM SILVER PO)  Take 1 tablet by mouth daily.   Yes Historical Provider, MD  simvastatin (ZOCOR) 40 MG tablet Take 40 mg by mouth at bedtime.     Yes Historical Provider, MD     Allergies:  Allergies  Allergen Reactions  . Morphine Itching  . Sulfonamide Derivatives Hives    Social History:   reports that she has never smoked. She does not have any smokeless tobacco history on file. She reports that  she does not drink alcohol or use illicit drugs.  Family History: No family history on file.   Physical Exam: Filed Vitals:   03/23/11 2030 03/23/11 2100 03/23/11 2115 03/23/11 2200  BP:  208/66  175/70  Pulse:  38 72 42  Temp:      TempSrc:      Resp: 18 17 19 20   Height:      Weight:      SpO2:  99% 99% 98%   Blood pressure 175/70, pulse 42, temperature 98.2 F (36.8 C), temperature source Oral, resp. rate 20, height 5\' 8"  (1.727 m), weight 79.379 kg (175 lb), SpO2 98.00%.  GEN:  Pleasant elderly Caucasian lady  lying in the stretcher in no acute distress; cooperative with exam PSYCH:  alert and oriented x4; does not appear anxious does not appear depressed; affect is  Appropriate HEENT:  Extensive yellow purplish bruising of her in entire face status post a week ago; Mucous membranes pink and anicteric; PERRLA; EOM intact; no cervical lymphadenopathy nor thyromegaly or carotid bruit; no JVD; Breasts:: Not examined CHEST WALL: No tenderness CHEST: Normal respiration, clear to auscultation bilaterally HEART:  Bradycardic Regular  rhythm; no murmurs rubs or gallops BACK:  Marked kyphosis; no  scoliosis; no CVA tenderness ABDOMEN: Obese, soft non-tender; no masses, no organomegaly, normal abdominal bowel sounds;  no intertriginous candida. Rectal Exam: Not done EXTREMITIES: ; age-appropriate arthropathy of the hands and knees; no edema; no ulcerations. Genitalia: not examined PULSES: 2+ and symmetric SKIN: Normal hydration no rash or ulceration CNS: Cranial nerves 2-12 grossly intact no focal  Lateralized neurologic deficit   Labs & Imaging Results for orders placed during the hospital encounter of 03/23/11 (from the past 48 hour(s))  CBC     Status: Abnormal   Collection Time   03/23/11  8:15 PM      Component Value Range Comment   WBC 9.4  4.0 - 10.5 (K/uL)    RBC 3.58 (*) 3.87 - 5.11 (MIL/uL)    Hemoglobin 11.9 (*) 12.0 - 15.0 (g/dL)    HCT 78.2 (*) 95.6 - 46.0 (%)     MCV 98.6  78.0 - 100.0 (fL)    MCH 33.2  26.0 - 34.0 (pg)    MCHC 33.7  30.0 - 36.0 (g/dL)    RDW 21.3  08.6 - 57.8 (%)    Platelets 346  150 - 400 (K/uL)   DIFFERENTIAL     Status: Normal   Collection Time   03/23/11  8:15 PM      Component Value Range Comment   Neutrophils Relative 65  43 - 77 (%)    Neutro Abs 6.1  1.7 - 7.7 (K/uL)    Lymphocytes Relative 23  12 - 46 (%)    Lymphs Abs 2.1  0.7 - 4.0 (K/uL)    Monocytes Relative 9  3 - 12 (%)    Monocytes Absolute 0.9  0.1 - 1.0 (K/uL)    Eosinophils Relative 3  0 - 5 (%)    Eosinophils  Absolute 0.3  0.0 - 0.7 (K/uL)    Basophils Relative 0  0 - 1 (%)    Basophils Absolute 0.0  0.0 - 0.1 (K/uL)   BASIC METABOLIC PANEL     Status: Abnormal   Collection Time   03/23/11  8:15 PM      Component Value Range Comment   Sodium 133 (*) 135 - 145 (mEq/L)    Potassium 3.7  3.5 - 5.1 (mEq/L)    Chloride 94 (*) 96 - 112 (mEq/L)    CO2 28  19 - 32 (mEq/L)    Glucose, Bld 141 (*) 70 - 99 (mg/dL)    BUN 15  6 - 23 (mg/dL)    Creatinine, Ser 1.61  0.50 - 1.10 (mg/dL)    Calcium 9.8  8.4 - 10.5 (mg/dL)    GFR calc non Af Amer 51 (*) >90 (mL/min)    GFR calc Af Amer 59 (*) >90 (mL/min)    Dg Chest Portable 1 View  03/23/2011  *RADIOLOGY REPORT*  Clinical Data: Chest pain  PORTABLE CHEST - 1 VIEW  Comparison: 03/11/2011  Findings: Severe cardiomegaly.  Pulmonary vascularity is within normal limits.  Chronic changes in the upper lung zones.  No definite consolidation.  No pneumothorax.  IMPRESSION: Cardiomegaly without edema.  Chronic changes.  Original Report Authenticated By: Donavan Burnet, M.D.   EKG shows second degree Mobitz type II block   Assessment Present on Admission:  .Symptomatic advanced heart block, second degree  .Paroxysmal a-fib .DM type 2 (diabetes mellitus, type 2) .Hyperlipidemia .Hypertension   PLAN:  Place external pacers Transfer patient emergent to step down unit at [facility] for a cardiac evaluation Because  of patient's history of serious difficult to control tachycardia, she may benefit from the addition of a pacemaker to her rate controlling drugs Continued outpatient management of her chronic medical conditions for the time being  Other plans as per orders.  Critical care time: 60 minutes.   Tanor Glaspy 03/23/2011, 10:59 PM

## 2011-03-23 NOTE — ED Notes (Signed)
Attempted to call [employer], no answer, voice mail service to return call next business day. No message left.

## 2011-03-23 NOTE — ED Notes (Signed)
Pt reports having a lightheaded episode this afternoon. Pt placed on cardiac monitor. Pulse rate decreased to the upper 30's. 2nd ekg obtained & pacing pads place on pt. EDP notified & has 2nd EKG.

## 2011-03-24 ENCOUNTER — Encounter (HOSPITAL_COMMUNITY)
Admission: EM | Disposition: A | Discharge status: Home / Self Care | Payer: Self-pay | Source: Ambulatory Visit | Attending: Cardiology

## 2011-03-24 ENCOUNTER — Encounter (HOSPITAL_COMMUNITY): Payer: Self-pay | Admitting: Cardiology

## 2011-03-24 HISTORY — PX: PERMANENT PACEMAKER INSERTION: SHX5480

## 2011-03-24 LAB — URINE MICROSCOPIC-ADD ON

## 2011-03-24 LAB — URINALYSIS, ROUTINE W REFLEX MICROSCOPIC
Bilirubin Urine: NEGATIVE
Ketones, ur: 15 mg/dL — AB
Nitrite: NEGATIVE
Protein, ur: NEGATIVE mg/dL
Urobilinogen, UA: 0.2 mg/dL (ref 0.0–1.0)
pH: 7.5 (ref 5.0–8.0)

## 2011-03-24 LAB — COMPREHENSIVE METABOLIC PANEL
AST: 28 U/L (ref 0–37)
Albumin: 3.6 g/dL (ref 3.5–5.2)
BUN: 10 mg/dL (ref 6–23)
Calcium: 9.3 mg/dL (ref 8.4–10.5)
Creatinine, Ser: 0.77 mg/dL (ref 0.50–1.10)
Total Protein: 6.7 g/dL (ref 6.0–8.3)

## 2011-03-24 LAB — CBC
MCH: 33.3 pg (ref 26.0–34.0)
MCHC: 34.6 g/dL (ref 30.0–36.0)
Platelets: 314 10*3/uL (ref 150–400)
RBC: 3.81 MIL/uL — ABNORMAL LOW (ref 3.87–5.11)
RDW: 13.8 % (ref 11.5–15.5)

## 2011-03-24 LAB — HEMOGLOBIN A1C
Hgb A1c MFr Bld: 6.9 % — ABNORMAL HIGH (ref <5.7)
Mean Plasma Glucose: 151 mg/dL — ABNORMAL HIGH (ref <117)

## 2011-03-24 LAB — GLUCOSE, CAPILLARY: Glucose-Capillary: 110 mg/dL — ABNORMAL HIGH (ref 70–99)

## 2011-03-24 SURGERY — PERMANENT PACEMAKER INSERTION
Anesthesia: LOCAL

## 2011-03-24 MED ORDER — HEPARIN (PORCINE) IN NACL 2-0.9 UNIT/ML-% IJ SOLN
INTRAMUSCULAR | Status: AC
  Filled 2011-03-24: qty 1000

## 2011-03-24 MED ORDER — SODIUM CHLORIDE 0.45 % IV SOLN: INTRAVENOUS | Status: DC

## 2011-03-24 MED ORDER — GLYBURIDE-METFORMIN 2.5-500 MG PO TABS: 0.5000 | ORAL_TABLET | Freq: Every day | ORAL | Status: DC

## 2011-03-24 MED ORDER — METFORMIN HCL 500 MG PO TABS
250.0000 mg | ORAL_TABLET | Freq: Every day | ORAL | Status: DC
  Administered 2011-03-25 – 2011-03-26 (×2): 250 mg via ORAL
  Filled 2011-03-24 (×5): qty 1

## 2011-03-24 MED ORDER — ACETAMINOPHEN 650 MG RE SUPP: 650.0000 mg | Freq: Four times a day (QID) | RECTAL | Status: DC | PRN

## 2011-03-24 MED ORDER — CARVEDILOL 6.25 MG PO TABS
6.2500 mg | ORAL_TABLET | Freq: Two times a day (BID) | ORAL | Status: DC
  Administered 2011-03-24 – 2011-03-25 (×2): 6.25 mg via ORAL
  Filled 2011-03-24 (×4): qty 1

## 2011-03-24 MED ORDER — SODIUM CHLORIDE 0.9 % IV SOLN: INTRAVENOUS | Status: DC

## 2011-03-24 MED ORDER — FENTANYL CITRATE 0.05 MG/ML IJ SOLN
INTRAMUSCULAR | Status: AC
  Filled 2011-03-24: qty 2

## 2011-03-24 MED ORDER — SODIUM CHLORIDE 0.9 % IJ SOLN: 3.0000 mL | Freq: Two times a day (BID) | INTRAMUSCULAR | Status: DC

## 2011-03-24 MED ORDER — INSULIN ASPART 100 UNIT/ML ~~LOC~~ SOLN: 0.0000 [IU] | Freq: Every day | SUBCUTANEOUS | Status: DC

## 2011-03-24 MED ORDER — SODIUM CHLORIDE 0.9 % IV SOLN: 250.0000 mL | INTRAVENOUS | Status: DC | PRN

## 2011-03-24 MED ORDER — ASPIRIN EC 325 MG PO TBEC
325.0000 mg | DELAYED_RELEASE_TABLET | Freq: Every day | ORAL | Status: DC
  Administered 2011-03-24 – 2011-03-26 (×3): 325 mg via ORAL
  Filled 2011-03-24 (×5): qty 1

## 2011-03-24 MED ORDER — ACETAMINOPHEN 325 MG PO TABS: 650.0000 mg | ORAL_TABLET | Freq: Four times a day (QID) | ORAL | Status: DC | PRN

## 2011-03-24 MED ORDER — SIMVASTATIN 40 MG PO TABS
40.0000 mg | ORAL_TABLET | Freq: Every day | ORAL | Status: DC
  Administered 2011-03-24 – 2011-03-25 (×2): 40 mg via ORAL
  Filled 2011-03-24 (×3): qty 1

## 2011-03-24 MED ORDER — INSULIN ASPART 100 UNIT/ML ~~LOC~~ SOLN
0.0000 [IU] | Freq: Three times a day (TID) | SUBCUTANEOUS | Status: DC
  Administered 2011-03-25: 1 [IU] via SUBCUTANEOUS
  Administered 2011-03-25: 2 [IU] via SUBCUTANEOUS
  Filled 2011-03-24: qty 3

## 2011-03-24 MED ORDER — CHLORHEXIDINE GLUCONATE 4 % EX LIQD: 60.0000 mL | Freq: Once | CUTANEOUS | Status: DC

## 2011-03-24 MED ORDER — SODIUM CHLORIDE 0.9 % IR SOLN
Status: DC
  Filled 2011-03-24: qty 2

## 2011-03-24 MED ORDER — CEFAZOLIN SODIUM 1-5 GM-% IV SOLN
1.0000 g | INTRAVENOUS | Status: DC
  Filled 2011-03-24: qty 50

## 2011-03-24 MED ORDER — BISACODYL 10 MG RE SUPP: 10.0000 mg | Freq: Every day | RECTAL | Status: DC | PRN

## 2011-03-24 MED ORDER — MIDAZOLAM HCL 2 MG/2ML IJ SOLN
INTRAMUSCULAR | Status: AC
  Filled 2011-03-24: qty 2

## 2011-03-24 MED ORDER — CEFAZOLIN SODIUM 1-5 GM-% IV SOLN
1.0000 g | Freq: Four times a day (QID) | INTRAVENOUS | Status: AC
  Administered 2011-03-24 – 2011-03-25 (×3): 1 g via INTRAVENOUS
  Filled 2011-03-24 (×3): qty 50

## 2011-03-24 MED ORDER — METFORMIN HCL 500 MG PO TABS
500.0000 mg | ORAL_TABLET | Freq: Every day | ORAL | Status: DC
  Administered 2011-03-24 – 2011-03-25 (×2): 500 mg via ORAL
  Filled 2011-03-24 (×3): qty 1

## 2011-03-24 MED ORDER — SODIUM CHLORIDE 0.9 % IJ SOLN
3.0000 mL | Freq: Two times a day (BID) | INTRAMUSCULAR | Status: DC
  Administered 2011-03-24 – 2011-03-25 (×2): 3 mL via INTRAVENOUS

## 2011-03-24 MED ORDER — CEFAZOLIN SODIUM 1-5 GM-% IV SOLN: 1.0000 g | INTRAVENOUS | Status: DC

## 2011-03-24 MED ORDER — ONDANSETRON HCL 4 MG/2ML IJ SOLN: 4.0000 mg | Freq: Four times a day (QID) | INTRAMUSCULAR | Status: DC | PRN

## 2011-03-24 MED ORDER — ONDANSETRON HCL 4 MG PO TABS
4.0000 mg | ORAL_TABLET | Freq: Four times a day (QID) | ORAL | Status: DC | PRN
  Administered 2011-03-26: 4 mg via ORAL
  Filled 2011-03-24: qty 1

## 2011-03-24 MED ORDER — LIDOCAINE HCL (PF) 1 % IJ SOLN
INTRAMUSCULAR | Status: AC
  Filled 2011-03-24: qty 60

## 2011-03-24 MED ORDER — SODIUM CHLORIDE 0.9 % IR SOLN: 80.0000 mg | Status: DC

## 2011-03-24 MED ORDER — GLYBURIDE 1.25 MG PO TABS
1.2500 mg | ORAL_TABLET | Freq: Every day | ORAL | Status: DC
  Administered 2011-03-25 – 2011-03-26 (×2): 1.25 mg via ORAL
  Filled 2011-03-24 (×5): qty 1

## 2011-03-24 MED ORDER — ACETAMINOPHEN 325 MG PO TABS
325.0000 mg | ORAL_TABLET | ORAL | Status: DC | PRN
  Administered 2011-03-24: 650 mg via ORAL
  Filled 2011-03-24 (×2): qty 2

## 2011-03-24 MED ORDER — CEFAZOLIN SODIUM 1-5 GM-% IV SOLN: 1.0000 g | Freq: Four times a day (QID) | INTRAVENOUS | Status: DC

## 2011-03-24 MED ORDER — SODIUM CHLORIDE 0.9 % IJ SOLN
3.0000 mL | INTRAMUSCULAR | Status: DC | PRN
  Administered 2011-03-26: 3 mL via INTRAVENOUS

## 2011-03-24 MED ORDER — FLEET ENEMA 7-19 GM/118ML RE ENEM: 1.0000 | ENEMA | Freq: Once | RECTAL | Status: AC | PRN

## 2011-03-24 NOTE — Consult Note (Signed)
Reason for Consult: symptomatic 2nd degree AV Block   Referring Physician: Dr. Orvan Falconer  Primary Cardiologist:  Dr. Vennie Homans is an 78 y.o. female.    Chief Complaint: Near syncope.   HPI: Kathleen [name] is an 78 y.o. female. Multiple medical problems including diabetes, hypertension, paroxysmal atrial fibrillation, for which she was started on rate controlling drugs amiodarone, Coreg, and high-dose aspirin, over 2 months ago by Dr. Rennis Golden.  Patient was seen in the emergency room on March 2 her syncopal episode associated with sudden standing-up, and after treatment for extensive bruises to her face she followed up with her cardiologist on March 6. Her Coreg was reduced by half to 6.25 mg twice and she continues to wear a cardiac monitor.  This afternoon patient began to feel very weak and she noted her heart is slow and pounding in her chest, she measured it at 38 beats per minute and therefore came to the emergency room because she felt she would be having a syncopal episode. In the emergency room she was noted to be in second-degree heart block and a consult was called so Dr. Clemon Chambers on call for Kathleen [name]. They declined admission but recommended the patient be transferred to [facility] for admission by the hospitalist service with a consult to [facility]. He recommended discontinuing the Coreg continuing the amiodarone and indicated that the likelihood that they will take over the patient's care in the morning.  Pt. Has been having 2:1 AV block on monitor, one episode in the 30's on Sat, that was brief and pt had no more episodes.  Previously she had numerous episodes of 2:1, per the monitoring [name] that were asymptomatic. Awaiting  office records.  Currently Pt. While in bed is without complaints.   No Chest pain, no SOB, no Lightheadedness.  She states at home she has not been so much lightheaded but weak.  HR 40.  EKG on admit 2:1 AV Block with RT. BBB  and Lt. Post. F. Block.  Plan for pacemaker today.   Pt. Is now NPO.  Past Medical History  Diagnosis Date  . Hypertension   . Diabetes mellitus   . High cholesterol   . Atrial fibrillation   . Paroxysmal a-fib 12/13/2010  . PNA (pneumonia)   . CAD (coronary artery disease) 2006    Past Surgical History  Procedure Date  . Replacement total knee   . Bladder tact     No family history on file. Social History:  reports that she has never smoked. She does not have any smokeless tobacco history on file. She reports that she does not drink alcohol or use illicit drugs.  Allergies:  Allergies  Allergen Reactions  . Morphine Itching  . Sulfonamide Derivatives Hives    Medications Prior to Admission  Medication Dose Route Frequency Provider Last Rate Last Dose  . 0.9 % NaCl with KCl 20 mEq/ L  infusion   Intravenous Continuous Vania Rea, MD 75 mL/hr at 03/23/11 2309 1,000 mL at 03/23/11 2309  . acetaminophen (TYLENOL) tablet 650 mg  650 mg Oral Q6H PRN Vania Rea, MD       Or  . acetaminophen (TYLENOL) suppository 650 mg  650 mg Rectal Q6H PRN Vania Rea, MD      . albuterol (PROVENTIL) (5 MG/ML) 0.5% nebulizer solution 2.5 mg  2.5 mg Nebulization Once [name], MD   2.5 mg at 03/23/11 2029  . amiodarone (PACERONE) tablet 200 mg  200 mg Oral BID Vania Rea, MD   200 mg at 03/23/11 2337  . aspirin EC tablet 325 mg  325 mg Oral Daily Vania Rea, MD      . bisacodyl (DULCOLAX) suppository 10 mg  10 mg Rectal Daily PRN Vania Rea, MD      . glyBURIDE (DIABETA) tablet 1.25 mg  1.25 mg Oral Q breakfast Vania Rea, MD      . insulin aspart (novoLOG) injection 0-5 Units  0-5 Units Subcutaneous QHS Vania Rea, MD      . insulin aspart (novoLOG) injection 0-9 Units  0-9 Units Subcutaneous TID WC Vania Rea, MD      . ipratropium (ATROVENT) nebulizer solution 0.5 mg  0.5 mg Nebulization Once [name], MD   0.5 mg at 03/23/11 2029    . metFORMIN (GLUCOPHAGE) tablet 250 mg  250 mg Oral Q breakfast Vania Rea, MD      . metFORMIN (GLUCOPHAGE) tablet 500 mg  500 mg Oral QHS Vania Rea, MD      . ondansetron [name]) tablet 4 mg  4 mg Oral Q6H PRN Vania Rea, MD       Or  . ondansetron [name]) injection 4 mg  4 mg Intravenous Q6H PRN Vania Rea, MD      . simvastatin (ZOCOR) tablet 40 mg  40 mg Oral QHS Vania Rea, MD      . sodium chloride 0.9 % injection 3 mL  3 mL Intravenous Q12H Vania Rea, MD      . sodium phosphate (FLEET) 7-19 GM/118ML enema 1 enema  1 enema Rectal Once PRN Vania Rea, MD      . traZODone (DESYREL) tablet 25 mg  25 mg Oral QHS PRN Vania Rea, MD      . DISCONTD: glyBURIDE-metformin (GLUCOVANCE) 2.5-500 MG per tablet 0.5 tablet  0.5 tablet Oral Q breakfast Vania Rea, MD       Medications Prior to Admission  Medication Sig Dispense Refill  . alendronate (FOSAMAX) 35 MG tablet Take 35 mg by mouth every 7 (seven) days. Take with a full glass of water on an empty stomach.patient takes on Sunday      . amiodarone (PACERONE) 200 MG tablet Take 200 mg by mouth 2 (two) times daily.       Marland Kitchen glyBURIDE-metformin (GLUCOVANCE) 2.5-500 MG per tablet Take 0.5 tablets by mouth daily with breakfast.       . metFORMIN (GLUCOPHAGE) 500 MG tablet Take 500 mg by mouth at bedtime.       . Multiple Vitamins-Minerals (CENTRUM SILVER PO) Take 1 tablet by mouth daily.      . simvastatin (ZOCOR) 40 MG tablet Take 40 mg by mouth at bedtime.          Results for orders placed during the hospital encounter of 03/23/11 (from the past 48 hour(s))  CBC     Status: Abnormal   Collection Time   03/23/11  8:15 PM      Component Value Range Comment   WBC 9.4  4.0 - 10.5 (K/uL)    RBC 3.58 (*) 3.87 - 5.11 (MIL/uL)    Hemoglobin 11.9 (*) 12.0 - 15.0 (g/dL)    HCT 91.4 (*) 78.2 - 46.0 (%)    MCV 98.6  78.0 - 100.0 (fL)    MCH 33.2  26.0 - 34.0 (pg)    MCHC 33.7  30.0 - 36.0  (g/dL)    RDW 95.6  21.3 - 08.6 (%)    Platelets  346  150 - 400 (K/uL)   DIFFERENTIAL     Status: Normal   Collection Time   03/23/11  8:15 PM      Component Value Range Comment   Neutrophils Relative 65  43 - 77 (%)    Neutro Abs 6.1  1.7 - 7.7 (K/uL)    Lymphocytes Relative 23  12 - 46 (%)    Lymphs Abs 2.1  0.7 - 4.0 (K/uL)    Monocytes Relative 9  3 - 12 (%)    Monocytes Absolute 0.9  0.1 - 1.0 (K/uL)    Eosinophils Relative 3  0 - 5 (%)    Eosinophils Absolute 0.3  0.0 - 0.7 (K/uL)    Basophils Relative 0  0 - 1 (%)    Basophils Absolute 0.0  0.0 - 0.1 (K/uL)   BASIC METABOLIC PANEL     Status: Abnormal   Collection Time   03/23/11  8:15 PM      Component Value Range Comment   Sodium 133 (*) 135 - 145 (mEq/L)    Potassium 3.7  3.5 - 5.1 (mEq/L)    Chloride 94 (*) 96 - 112 (mEq/L)    CO2 28  19 - 32 (mEq/L)    Glucose, Bld 141 (*) 70 - 99 (mg/dL)    BUN 15  6 - 23 (mg/dL)    Creatinine, Ser 8.29  0.50 - 1.10 (mg/dL)    Calcium 9.8  8.4 - 10.5 (mg/dL)    GFR calc non Af Amer 51 (*) >90 (mL/min)    GFR calc Af Amer 59 (*) >90 (mL/min)   APTT     Status: Normal   Collection Time   03/23/11 10:57 PM      Component Value Range Comment   aPTT 29  24 - 37 (seconds)   PROTIME-INR     Status: Normal   Collection Time   03/23/11 10:57 PM      Component Value Range Comment   Prothrombin Time 14.3  11.6 - 15.2 (seconds)    INR 1.09  0.00 - 1.49    MRSA PCR SCREENING     Status: Normal   Collection Time   03/24/11  1:20 AM      Component Value Range Comment   MRSA by PCR NEGATIVE  NEGATIVE    URINALYSIS, ROUTINE W REFLEX MICROSCOPIC     Status: Abnormal   Collection Time   03/24/11  6:03 AM      Component Value Range Comment   Color, Urine YELLOW  YELLOW     APPearance CLOUDY (*) CLEAR     Specific Gravity, Urine 1.007  1.005 - 1.030     pH 7.5  5.0 - 8.0     Glucose, UA NEGATIVE  NEGATIVE (mg/dL)    Hgb urine dipstick NEGATIVE  NEGATIVE     Bilirubin Urine NEGATIVE   NEGATIVE     Ketones, ur 15 (*) NEGATIVE (mg/dL)    Protein, ur NEGATIVE  NEGATIVE (mg/dL)    Urobilinogen, UA 0.2  0.0 - 1.0 (mg/dL)    Nitrite NEGATIVE  NEGATIVE     Leukocytes, UA TRACE (*) NEGATIVE    URINE MICROSCOPIC-ADD ON     Status: Normal   Collection Time   03/24/11  6:03 AM      Component Value Range Comment   Squamous Epithelial / LPF RARE  RARE     WBC, UA 0-2  <3 (WBC/hpf)    Bacteria, UA RARE  RARE     Dg Chest Portable 1 View  03/23/2011  *RADIOLOGY REPORT*  Clinical Data: Chest pain  PORTABLE CHEST - 1 VIEW  Comparison: 03/11/2011  Findings: Severe cardiomegaly.  Pulmonary vascularity is within normal limits.  Chronic changes in the upper lung zones.  No definite consolidation.  No pneumothorax.  IMPRESSION: Cardiomegaly without edema.  Chronic changes.  Original Report Authenticated By: Donavan Burnet, M.D.    ROS: General:Recent "Cold" with cough and runny nose, completed antibiotics 2 days ago. + weakness Skin: no rashes, + bruising of face after fall over a week ago HEENT:recovering from congestion CV:no chest pain, no SOB PUL:no SOB GI:no diarrhea , no constipation.no melena GU:no hematuria MS:no joint pain Neuro:no syncope, in last week Endo:+ diabetes, glucose has been stable   Blood pressure 157/56, pulse 37, temperature 97.6 F (36.4 C), temperature source Oral, resp. rate 18, height 5\' 8"  (1.727 m), weight 156 lb 8.4 oz (71 kg), SpO2 99.00%. PE: General:A&O X 3, MAE, follows commands, pleasant affect Skin:W&D, brisk capillary refill HEENT:normocephalic, sclera clear, bruising rt. Face and down below her chin, older bruising. Neck:soupple, no JVD, no bruits Heart:S1S2, RRR, slow Lungs:clear, without rales, rhonchi or wheezes Abd:+ BS, soft non tender Ext:no edema, SCD stockings in place. Neuro:Alert and oriented X 3, MAE, follows commands.    Assessment/Plan Patient Active Problem List  Diagnoses  . DIABETES  . MONONEURITIS, LEG  . KNEE,  ARTHRITIS, DEGEN./OSTEO  . Effusion of lower leg joint  . HEMARTHROSIS, LOWER LEG  . KNEE PAIN  . SPINAL STENOSIS  . Hypertension  . TOTAL KNEE FOLLOW-UP  . Lung nodule  . DM type 2 (diabetes mellitus, type 2)  . Hyperlipidemia  . Symptomatic advanced heart block  . Paroxysmal a-fib   PLAN: Will Keep NPO for permanent pacer today, Dr. Royann Shivers to see. Dr, Rennis Golden had discussed pacemaker with Pt. In the office.   Will take on our service.  Appreciate admit by the hospitalist.   INGOLD,LAURA R 03/24/2011, 7:59 AM    I have seen and examined the patient along with [name] R, NP.  I have reviewed the chart, notes and new data.  I agree with NP's note.  Key new complaints: asymptomatic at rest Key examination changes: extensive facial ecchymoses on R side; marked bradycardia, wide pulse pressure, paradoxically split S2 Key new findings / data: persistent 2:1 AV block, likely infrahisian (broad QRS), in part related to necessary medications (amiodarone and carvedilol for paroxysmal atrial fibrillation).  PLAN: Dual chamber permanent pacemaker. Risks and benefits reviewed in detail. Lucky she was not on anticoagulants when she passed out and fell, but long term should be on anticoagulant therapy. She is firmly and vehemently opposed to warfarin after hemarthrosis problems. Will discuss options at a future date.  Thurmon Fair, MD, [name] [phone] 03/24/2011, 11:23 AM

## 2011-03-24 NOTE — Progress Notes (Signed)
[facility] hospitalist progress note. Transfer note. History of present illness. This 78 year old female seen at [facility] today for a syncopal event. She has a past history of atrial fib. She had her first syncopal event on March 2 while suddenly standing up. She saw her cardiologist on March 6 and he reduced her Coreg dosing by half to 6.25 mg twice daily. She was wearing a cardiac monitor. On the day of admission she felt her pulse was slow and pounding in her chest. She was found to be bradycardic and EKG indicated second-degree heart block. Dr. Clemon Chambers of [facility] was notified and he requested [facility] hospitalist admit and transferred to [facility]. I am seeing the patient at the bedside to insure ongoing stability and that her orders transferred appropriately. The patient herself has no complaints. Particularly no dyspnea or chest pain. Vital signs. Temperature 97.6, pulse 65, respiration 19, blood pressure 177/53. O2 sats 98%. General appearance. Well-developed elderly female in no distress. Alert, pleasant, oriented. Cardiac. Rhythm regular in rate bradycardic apically about 45 per minute currently. No jugular venous distention or edema. Lungs. Some mild expiratory wheezes from the right upper airways. No distress or cough and stable O2 sats. Abdomen. Soft with positive bowel sounds. No pain. Extremities. Warm to touch and capillary refill normal. No edema. Anti-embolisms stockings in place. Problem #1 second degree heart block. This symptomatic with syncope x2. Patient appears stable from my perspective. I asked the staff to have external pacers at the bedside ready in case needed. Dr. Herbie Baltimore for [facility] is aware of the patient and her transfer status to [facility]. They will follow this morning. All her orders appear to have transferred appropriately.

## 2011-03-24 NOTE — Op Note (Signed)
Procedure report Kathleen [name], Kathleen [name] Female, 78 y.o., [birth year]  MRN: [id number]   Procedure performed: 1. Implantation of new dual chamber permanent pacemaker 2. Fluoroscopy 3.  Light sedation  Reason for procedure:  Syncope and Symptomatic bradycardia due to: Second degree atrioventricular block, Mobitz type II Bradycardia due to necessary medications (amiodarone and carvedilol for paroxysmal atrial fibrillation)  Procedure performed by: Thurmon Fair, MD  Complications: None  Estimated blood loss: <10 mL  Medications administered during procedure: Ancef 1 g intravenously Lidocaine 1% 30 mL locally,  Fentanyl 50 mcg intravenously Versed 2 mg intravenously  Device details:  Generator Medtronic Revo model RV DRO1 serial number [id number] H Right atrial lead Medtronic 5086 MRI-52 cm serial number [id number] V Right ventricular lead Medtronic 5086 MRI-58 cm serial number [id number]  Procedure details:  After the risks and benefits of the procedure were discussed the patient provided informed consent and was brought to the cardiac cath lab in the fasting state. The patient was prepped and draped in usual sterile fashion. Local anesthesia with 1% lidocaine was administered to to the left infraclavicular area. A 5-6 cm horizontal incision was made parallel with and 2-3 cm caudal to the left clavicle. Using electrocautery and blunt dissection a prepectoral pocket was created down to the level of the pectoralis major muscle fascia. The pocket was carefully inspected for hemostasis. An antibiotic-soaked sponge was placed in the pocket.  Under fluoroscopic guidance and using the modified Seldinger technique 2 separate venipunctures were performed to access the left subclavian vein. minimal difficulty was encountered accessing the vein.  Two J-tip guidewires were subsequently exchanged for 8 French safe sheaths.  Under fluoroscopic guidance the ventricular lead was advanced to level of  the mid to apical right ventricular septum and thet active-fixation helix was deployed. moderate current of injury was seen. Satisfactory pacing and sensing parameters were recorded. There was no evidence of diaphragmatic stimulation at maximum device output. The safe sheath was peeled away and the lead was secured in place with 2-0 silk.  In similar fashion the right atrial lead was advanced to the level of the atrial appendage. The active-fixation helix was deployed. There was prominent current of injury. Good sensing was noted but the pacing threshold was in excess of 3 V at 0.5 ms pulse. Even after waiting for several minutes there is no improvement in the pacing thresholds. The active fixation helix was withdrawn and the lead was repositioned in the anterolateral wall. Satisfactory  pacing and sensing parameters were recorded. There was very prominent current of injury. There was no evidence of diaphragmatic stimulation with pacing at maximum device output. The safe sheath was peeled away and the lead was secured in place with 2-0 silk.  The antibiotic-soaked sponge was removed from the pocket. The pocket was flushed with copious amounts of antibiotic solution. Reinspection showed excellent hemostasis..  The ventricular lead was connected to the generator and appropriate ventricular pacing was seen. Subsequently the atrial lead was also connected. Repeat testing of the lead parameters later showed excellent values.  The entire system was then carefully inserted in the pocket with care been taking that the leads and device assumed a comfortable position without pressure on the incision. Great care was taken that the leads be located deep to the generator. The pocket was then closed in layers using 2 layers of 2-0 Vicryl and cutaneous staples, after which a sterile dressing was applied.  At the end of the procedure the following lead parameters were encountered:  Right  atrial lead  sensed P waves 1.4  mV, impedance 908ohms, threshold 1 V at 0.5 ms pulse width.  Right ventricular lead sensed R waves 23.6 mV, impedance 1249 ohms, threshold 1 V at 0.5 ms pulse width.  Thurmon Fair, MD, [facility] [phone] office [phone] pager 03/24/2011 3:19 PM  Cc: Karleen Hampshire, MD K. [place] [name], MD

## 2011-03-24 NOTE — Progress Notes (Signed)
History reviewed.    Noted that [facility] has assumed care of pt.  I will sign off, and have changed the attending in the computer to reflect this change of service.  Lonia Blood, MD [facility] Office  [phone] Pager [phone]  On-Call/Text Page:      [email or web address]      password [facility]

## 2011-03-24 NOTE — Progress Notes (Signed)
0600 - Zoll #2 with external pads placed at patients bedside. Pt remains stable.  Kathleen [name] [facility]  03/24/2011

## 2011-03-25 ENCOUNTER — Inpatient Hospital Stay (HOSPITAL_COMMUNITY): Payer: Medicare Other

## 2011-03-25 DIAGNOSIS — J449 Chronic obstructive pulmonary disease, unspecified: Secondary | ICD-10-CM | POA: Diagnosis present

## 2011-03-25 DIAGNOSIS — I2510 Atherosclerotic heart disease of native coronary artery without angina pectoris: Secondary | ICD-10-CM | POA: Diagnosis present

## 2011-03-25 DIAGNOSIS — R55 Syncope and collapse: Secondary | ICD-10-CM | POA: Diagnosis not present

## 2011-03-25 LAB — GLUCOSE, CAPILLARY
Glucose-Capillary: 144 mg/dL — ABNORMAL HIGH (ref 70–99)
Glucose-Capillary: 135 mg/dL — ABNORMAL HIGH (ref 70–99)
Glucose-Capillary: 118 mg/dL — ABNORMAL HIGH (ref 70–99)

## 2011-03-25 MED ORDER — CARVEDILOL 12.5 MG PO TABS
12.5000 mg | ORAL_TABLET | Freq: Two times a day (BID) | ORAL | Status: DC
  Administered 2011-03-25 – 2011-03-26 (×2): 12.5 mg via ORAL
  Filled 2011-03-25 (×5): qty 1

## 2011-03-25 MED ORDER — LOSARTAN POTASSIUM 25 MG PO TABS
25.0000 mg | ORAL_TABLET | Freq: Every day | ORAL | Status: DC
  Administered 2011-03-25: 25 mg via ORAL
  Filled 2011-03-25 (×2): qty 1

## 2011-03-25 NOTE — Evaluation (Signed)
Physical Therapy Evaluation Patient Details Name: Kathleen [name] MRN: [id number] DOB: 1924-10-24 Today's Date: 03/25/2011  Problem List:  Patient Active Problem List  Diagnoses  . DIABETES  . MONONEURITIS, LEG  . KNEE, ARTHRITIS, DEGEN./OSTEO  . Effusion of lower leg joint  . HEMARTHROSIS, LOWER LEG  . KNEE PAIN  . SPINAL STENOSIS  . Hypertension  . TOTAL KNEE FOLLOW-UP  . Lung nodule  . DM type 2 (diabetes mellitus, type 2)  . Hyperlipidemia  . Symptomatic advanced heart block, 2:1 AV block, RT BBB, Lt post. fas. block.  . Paroxysmal a-fib, on AMIO, refuses Coumadin  . Syncope  . COPD on CXR  . CAD, moderate by cath 2006    Past Medical History:  Past Medical History  Diagnosis Date  . Hypertension   . Diabetes mellitus   . High cholesterol   . Atrial fibrillation   . Paroxysmal a-fib 12/13/2010  . PNA (pneumonia)   . CAD (coronary artery disease) 2006   Past Surgical History:  Past Surgical History  Procedure Date  . Replacement total knee   . Bladder tact     PT Assessment/Plan/Recommendation PT Assessment Clinical Impression Statement: Pt s/p pacemaker with education provided for maximizing mobility and gait adhering to limited LUE precautions. Will follow to maximize independence but education provided for pt and dgtr such that pt is safe for discharge home with dgtr assist.  PT Recommendation/Assessment: Patient will need skilled PT in the acute care venue PT Problem List: Decreased strength;Decreased activity tolerance;Decreased balance Barriers to Discharge: None PT Therapy Diagnosis : Difficulty walking PT Plan PT Frequency: Min 3X/week PT Treatment/Interventions: Gait training;Functional mobility training;Therapeutic exercise;Therapeutic activities;Patient/family education PT Recommendation Follow Up Recommendations: No PT follow up Equipment Recommended: None recommended by PT PT Goals  Acute Rehab PT Goals PT Goal Formulation: With  patient/family Time For Goal Achievement: 7 days Pt will go Sit to Stand: with modified independence PT Goal: Sit to Stand - Progress: Goal set today Pt will go Stand to Sit: with modified independence PT Goal: Stand to Sit - Progress: Goal set today Pt will Ambulate: >150 feet;with modified independence;with least restrictive assistive device PT Goal: Ambulate - Progress: Goal set today Pt will Perform Home Exercise Program: Independently PT Goal: Perform Home Exercise Program - Progress: Goal set today  PT Evaluation Precautions/Restrictions  Precautions Precautions: ICD/Pacemaker Precaution Comments: sling for 24hrs, do not roll to affected side x 24hrs Prior Functioning  Home Living Lives With: Daughter Type of Home: House Home Layout: One level Home Access: Stairs to enter Entrance Stairs-Rails: None Entrance Stairs-Number of Steps: 2 Bathroom Shower/Tub: Walk-in Contractor: Standard Home Adaptive Equipment: Walker - rolling;Straight cane;Bedside commode/3-in-1;Built-in shower seat Prior Function Level of Independence: Independent with basic ADLs;Independent with transfers;Independent with homemaking with ambulation;Independent with gait Driving: No Vocation: Retired Comments: Dgtr lives with pt and available 24hrs/ day Cognition Cognition Arousal/Alertness: Awake/alert Overall Cognitive Status: Appears within functional limits for tasks assessed Sensation/Coordination Sensation Light Touch: Appears Intact Extremity Assessment   Mobility (including Balance) Bed Mobility Bed Mobility: Yes Rolling Right: 6: Modified independent (Device/Increase time) Right Sidelying to Sit: 6: Modified independent (Device/Increase time);HOB flat Sitting - Scoot to Edge of Bed: 6: Modified independent (Device/Increase time) Sit to Sidelying Right: 6: Modified independent (Device/Increase time);HOB flat Transfers Transfers: Yes Sit to Stand: 4: Min assist;5:  Supervision;From chair/3-in-1;From bed Sit to Stand Details (indicate cue type and reason): min assist from chair with cues for sequence with P.T. assist x 1 and  dgtr assist x 1, supervision from bed Stand to Sit: 5: Supervision;To bed;To chair/3-in-1;With armrests Stand to Sit Details: armrest on Right only Ambulation/Gait Ambulation/Gait: Yes Ambulation/Gait Assistance: 6: Modified independent (Device/Increase time) Ambulation Distance (Feet): 200 Feet Assistive device: Rolling walker Gait Pattern: Within Functional Limits Stairs: Yes Stairs Assistance: 5: Supervision Stairs Assistance Details (indicate cue type and reason): use of rail on pt's right for ascent and descent Stair Management Technique: One rail Right;One rail Left;Forwards Number of Stairs: 4  Height of Stairs: 8   Posture/Postural Control Posture/Postural Control: No significant limitations Exercise  General Exercises - Lower Extremity Long Arc Quad: AROM;Both;10 reps;Seated Hip Flexion/Marching: AROM;Both;10 reps;Seated End of Session PT - End of Session Equipment Utilized During Treatment: Gait belt;Other (comment) (sling) Activity Tolerance: Patient tolerated treatment well Patient left: in chair;with call bell in reach;with family/visitor present General Behavior During Session: [facility] for tasks performed Cognition: [facility] for tasks performed  Delorse Lek 03/25/2011, 1:56 PM  Delaney Meigs, PT [phone]

## 2011-03-25 NOTE — Progress Notes (Signed)
UR Completed. Simmons, Brylin Stanislawski F [phone]  
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
## 2011-03-25 NOTE — Progress Notes (Signed)
Orthopedic Tech Progress Note Patient Details:  Kathleen [name] 03/29/1924 [id number]  Patient ID: Kathleen [name], female   DOB: [birth year], 78 y.o.   MRN: [id number] Spoke with pt nurse. Patient confirmed receipt of sling prior.  Leo Grosser T 03/25/2011, 12:19 PM

## 2011-03-26 LAB — BASIC METABOLIC PANEL
BUN: 13 mg/dL (ref 6–23)
CO2: 23 mEq/L (ref 19–32)
Calcium: 9 mg/dL (ref 8.4–10.5)
Chloride: 101 mEq/L (ref 96–112)
Creatinine, Ser: 0.8 mg/dL (ref 0.50–1.10)
GFR calc Af Amer: 76 mL/min — ABNORMAL LOW (ref >90)
GFR calc non Af Amer: 66 mL/min — ABNORMAL LOW (ref >90)
Glucose, Bld: 135 mg/dL — ABNORMAL HIGH (ref 70–99)
Potassium: 3.3 mEq/L — ABNORMAL LOW (ref 3.5–5.1)
Sodium: 135 mEq/L (ref 135–145)

## 2011-03-26 LAB — GLUCOSE, CAPILLARY
Glucose-Capillary: 168 mg/dL — ABNORMAL HIGH (ref 70–99)
Glucose-Capillary: 126 mg/dL — ABNORMAL HIGH (ref 70–99)
Glucose-Capillary: 129 mg/dL — ABNORMAL HIGH (ref 70–99)
Glucose-Capillary: 117 mg/dL — ABNORMAL HIGH (ref 70–99)

## 2011-03-26 MED ORDER — POTASSIUM CHLORIDE CRYS ER 20 MEQ PO TBCR
40.0000 meq | EXTENDED_RELEASE_TABLET | Freq: Once | ORAL | Status: AC
  Administered 2011-03-26: 40 meq via ORAL
  Filled 2011-03-26: qty 2

## 2011-03-26 MED ORDER — CARVEDILOL 12.5 MG PO TABS: 12.5000 mg | ORAL_TABLET | Freq: Two times a day (BID) | ORAL | Status: DC

## 2011-03-26 MED ORDER — LOSARTAN POTASSIUM 50 MG PO TABS: 50.0000 mg | ORAL_TABLET | Freq: Every day | ORAL | Status: DC

## 2011-03-26 MED ORDER — LOSARTAN POTASSIUM 50 MG PO TABS
50.0000 mg | ORAL_TABLET | Freq: Every day | ORAL | Status: DC
  Administered 2011-03-26: 50 mg via ORAL
  Filled 2011-03-26: qty 1

## 2011-03-26 MED ORDER — ACETAMINOPHEN 325 MG PO TABS: 325.0000 mg | ORAL_TABLET | ORAL | Status: DC | PRN

## 2011-03-26 NOTE — Progress Notes (Signed)
Pt. Discharged 03/26/2011  6:01 PM Discharge instructions reviewed with patient/family. Patient/family verbalized understanding. All Rx's given. Questions answered as needed. Pt. Discharged to home with family/self.  Kathleen [name]

## 2011-03-26 NOTE — Discharge Instructions (Signed)
Supplemental Discharge Instructions for  Pacemaker/Defibrillator Patients  Activity Do not raise your left/right arm above shoulder level or extend it backward beyond shoulder level for 2 weeks. Wear the arm sling as a reminder or as needed for comfort for 2 weeks. No heavy lifting or vigorous activity with your left/right arm for 6-8 weeks.    NO DRIVING is preferable for 2 weeks; If absolutely necessary, drive only short, familiar routes. DO wear your seatbelt, even if it crosses over the pacemaker site.  WOUND CARE   Keep the wound area clean and dry.  Remove the dressing the day after you return home (usually 48 hours after the procedure).   DO NOT SUBMERGE UNDER WATER UNTIL FULLY HEALED (no tub baths, hot tubs, swimming pools, etc.).    You  may shower or take a sponge bath after the dressing is removed. DO NOT SOAK the area and do not allow the shower to directly spray on the site.   If you have staples, these will be removed in the office in 7-14 days.   If you have tape/steri-strips on your wound, these will fall off; do not pull them off prematurely.     No bandage is needed on the site.  DO  NOT apply any creams, oils, or ointments to the wound area.   If you notice any drainage or discharge from the wound, any swelling, excessive redness or bruising at the site, or if you develop a fever > 101? F after you are discharged home, call the office at once.  Special Instructions   You are still able to use cellular telephones.  Avoid carrying your cellular phone near your device.   When traveling through airports, show security personnel your identification card to avoid being screened in the metal detectors.    Avoid arc welding equipment, MRI testing (magnetic resonance imaging), TENS units (transcutaneous nerve stimulators).  Call the office for questions about other devices.   Avoid electrical appliances that are in poor condition or are not properly grounded.   Microwave ovens are  safe to be near or to operate.  Additional information for defibrillator patients should your device go off:   If your device goes off ONCE and you feel fine afterward, notify the clinic at [phone].   If your device goes off ONCE and you do not feel well afterward, call 911.   If your device goes off TWICE or more in one day, call 911.  DO NOT DRIVE YOURSELF OR A FAMILY MEMBER WITH A DEFIBRILLATOR TO THE HOSPITAL--CALL 911.   Dual-Chamber Pacemaker A pacemaker is a small, lightweight, battery-powered device that is implanted under the skin in the upper chest. Your caregiver may place a pacemaker if your heartbeat is too slow (bradycardia) or if you experience symptoms from a slow heartbeat. A dual-chamber pacemaker has 2 leads (electrodes) that are connected in your heart. One lead is placed in the upper chamber of the heart, called the right atrium. The second lead is placed in the lower part of the heart, called the right ventricle. Dual-chamber pacemakers may pace in both the upper chamber and lower chamber. By doing so, correct rhythm and function are often maintained. When the heart rate is too slow, the pacemaker senses the heartbeat and will pace the heart at a programmed rate. CAUSES  Different conditions can cause a slow heart rate. Some of these can include:  Sick sinus syndrome. This is a type of slow heart rate where the "pacemaker"   of the heart does not work very well. It is often related to aging.   Heart attack (myocardial infarction). This can damage the heart muscle and cause a slow heart beat.   Heart block. This is a condition where the signal that causes the heart to beat does not communicate very well between the upper chambers of the heart and the lower chambers of the heart.   Some heart medications that control fast heart rates or other abnormal heart rhythms can also cause a slow heart rate.  SYMPTOMS  A very slow heart rate results in the heart not pumping  enough blood to your body. Symptoms of a slow heart rate can include:  Passing out (fainting).   Confusion.   Shortness of breath.   Tiredness (fatigue).   Ankle swelling.   Chest discomfort or pain.  DIAGNOSIS  Tests will be done to look at how your heart works and beats. This can include:  A physical exam.   An electrocardiogram (ECG). An ECG records your heart beat on a strip of paper for your caregiver to look at.   Continuous ECG monitoring:   Holter monitor or an Event monitor. These devices record your heart rhythm and can be worn for 24 or more hours at a time. Your caregiver can then look at the recorded history of your heartbeat.   An electrophysiology study. This is a test to study the heart's electrical system. If your heart has a disruption in its electrical pathway, a slow heart beat can occur.  PACEMAKER IMPLANTATION  Do not eat or drink for 6 hours before the procedure or as told by your caregiver.   Pacemaker implantation usually takes about one hour.   Your skin on your upper chest will be cleaned with germ-killing soap.   Sedation will be given through an IV. This will help you relax during the procedure.   The site of the incision, often just below a collarbone, will be injected with numbing medicine.   The insulated electrode is inserted through a large vein in your chest. Then, using a special type of X-ray (fluoroscopy), the tip is positioned in the target area of your heart. The end of the pacemaker lead is fixated to your heart by a corkscrew tip or by small "tines" (soft anchor hooks).   The connection between the pacemaker electrode and the heart is checked to ensure optimal contact and placement.   After your pacemaker is implanted, you will need to stay in the hospital to make sure the pacemaker is working correctly. You will be able to go home when your caregiver feels it is safe for you to do so.  HOME CARE INSTRUCTIONS   Excessive movement  of the arm next to the new pacemaker can cause the electrodes to dislodge. Your caregiver will determine how many days the upper arm should not be moved excessively. It is usually three or more days.   The incision needs to be kept dry as told by your caregiver. As with any surgery, if the incision becomes swollen, red or pus (yellow or tan drainage) appears, call your caregiver right away.   Your caregiver may use small strips of tape hold the incision closed. They should be allowed to fall off naturally. Do not pull the strips of tape off.   Digital cell phones should be kept 12 inches away from the pacemaker. Hold them at the ear on the side opposite of the pacer.   Never leave a   cell phone in a pocket over the pacemaker.   Avoid strong electro-magnetic fields. You will not be able to have an MRI scan because of the strong magnets.   The pacemaker battery should last several years. The pacemaker needs to be checked at regular intervals as told by your caregiver.  RISKS AND COMPLICATIONS An implanted pacemaker has risks. Some of these can include:  Infection.   The pacemaker electrode can become dislodged. If this should happen, a second surgery would be needed to reposition it.   During pacemaker implantation, it is possible to puncture the lung. This is a very rare occurrence.  SEEK MEDICAL CARE IF:   You have dizziness or pass out.   You feel your heart "skipping" beats or feel your heart "racing."   Hiccups that do not go away.   You develop redness, swelling or pain at the pacemaker insertion site.   The pacemaker insertion site has yellow drainage or there is a bad odor coming from the insertion site.   An unexplained temperature of 102 F (38.9 C) or above develops.  MAKE SURE YOU:   Understand these instructions.   Will watch your condition.   Will get help right away if you are not doing well or get worse.  Document Released: 10/27/2008 Document Revised:  12/19/2010 Document Reviewed: 10/27/2008 Exit[employer] Patient Information 2012 [employer]. 

## 2011-03-26 NOTE — Progress Notes (Signed)
Physical Therapy Treatment Patient Details Name: Kathleen [name] MRN: [id number] DOB: [birth year] Today's Date: 03/26/2011  PT Assessment/Plan  PT - Assessment/Plan Comments on Treatment Session: Did well today. Most difficulty with sit->stand. Granddaughter educated on assistance technique for sit->stand and they were able to perform independently as a team. Daughter to provide 24 hour assist when she goes home and granddaughter lives down the street.  PT Plan: Discharge plan remains appropriate;Frequency remains appropriate Follow Up Recommendations: No PT follow up Equipment Recommended: None recommended by PT PT Goals  Acute Rehab PT Goals PT Goal: Sit to Stand - Progress: Progressing toward goal PT Goal: Stand to Sit - Progress: Progressing toward goal PT Goal: Ambulate - Progress: Met PT Goal: Perform Home Exercise Program - Progress: Progressing toward goal  PT Treatment Precautions/Restrictions  Precautions Precautions: ICD/Pacemaker Precaution Comments: sling for 24hrs, do not roll to affected side x 24hrs Restrictions Weight Bearing Restrictions: No Mobility (including Balance) Bed Mobility Bed Mobility: No Transfers Sit to Stand: 4: Min assist;From chair/3-in-1;With armrests (RUE used arm rest) Sit to Stand Details (indicate cue type and reason): observed granddaughter and pt attempt sit->stand with granddaughter pulling under right arm; cues for facilitation at sacrum to assist with hip extension and 2nd attempt successful with minA (only assist from granddaughter)  Stand to Sit: To chair/3-in-1;5: Supervision Ambulation/Gait Ambulation/Gait Assistance: 6: Modified independent (Device/Increase time) Ambulation Distance (Feet): 250 Feet Assistive device: Rolling walker Gait Pattern: Trunk flexed    Exercise  General Exercises - Lower Extremity Toe Raises: AROM;Both;Standing Mini-Sqauts: AAROM;Both;Standing;5 reps (arthritic knees made this more difficult) End of  Session PT - End of Session Equipment Utilized During Treatment: Gait belt Activity Tolerance: Patient tolerated treatment well Patient left: in chair;with call bell in reach;with family/visitor present General Behavior During Session: [facility] for tasks performed Cognition: [facility] for tasks performed  [facility] HELEN 03/26/2011, 1:01 PM
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
## 2011-03-28 NOTE — Discharge Summary (Signed)
Physician Discharge Summary  Patient ID: Kathleen [name] MRN: [id number] DOB/AGE: [birth year] 76 y.o.  Admit date: 03/23/2011 Discharge date: 03/28/2011  Admission Diagnoses:  Discharge Diagnoses:  Principal Problem:  *Symptomatic advanced heart block, 2:1 AV block, RT BBB, Lt post. fas. block. Active Problems:  Hypertension  DM type 2 (diabetes mellitus, type 2)  Hyperlipidemia  Paroxysmal a-fib, on AMIO, refuses Coumadin  Syncope  COPD on CXR  CAD, moderate by cath 2006   Discharged Condition: stable  Hospital Course:  Kathleen [name] is an 78 y.o. female. Multiple medical problems including diabetes, hypertension, paroxysmal atrial fibrillation, for which she was started on rate controlling drugs amiodarone, Coreg, and high-dose aspirin, over 2 months ago by Dr. Rennis Golden.  Patient was seen in the emergency room on March 2 her syncopal episode associated with sudden standing-up, and after treatment for extensive bruises to her face she followed up with her cardiologist on March 6. Her Coreg was reduced by half to 6.25 mg twice and she continues to wear a cardiac monitor.  The afternoon of admission the patient began to feel very weak and she noted her heart was slow and pounding in her chest, she measured it at 38 beats per minute and therefore came to the emergency room because she felt she would be having a syncopal episode. In the emergency room she was noted to be in second-degree heart block.  Pt. Has been having 2:1 AV block on monitor, one episode in the 30's on Sat, that was brief and pt had no more episodes. Previously she had numerous episodes of 2:1, per the monitoring center that were asymptomatic.  EKG on admit 2:1 AV Block with RT. BBB and Lt. Post. F. Block.  She was scheduled for dual chamber PPM which was completed on 03/24/11.  The device is a:  Health visitor RV DRO1 serial number PTN [id number]  Right atrial lead Medtronic 5086 MRI-52 cm serial number  LFP [id number]  Right ventricular lead Medtronic 5086 MRI-58 cm serial number [id number]  Coreg added back due to HTN.  Cozaar was increased to 50 mg.  She was still weak the day after the procedure and kept one more day for observation.  The morning of discharge she reported nausea which has been a chronic according to the patient.  She was kept until the afternoon and discharged after improvement in symptoms.  She was DCd in stable condition with FU arranged.  Anticoagulation should be addressed at OV.  Consults: None  Significant Diagnostic Studies:  Procedure performed:  1. Implantation of new dual chamber permanent pacemaker  2. Fluoroscopy  3. Light sedation  Reason for procedure:  Syncope and Symptomatic bradycardia due to:  Second degree atrioventricular block, Mobitz type II  Bradycardia due to necessary medications (amiodarone and carvedilol for paroxysmal atrial fibrillation)  Procedure performed by:  Thurmon Fair, MD  Complications:  None  Estimated blood loss:  <10 mL  Medications administered during procedure:  Ancef 1 g intravenously  Lidocaine 1% 30 mL locally,  Fentanyl 50 mcg intravenously  Versed 2 mg intravenously  Device details:  Generator Medtronic Revo model RV DRO1 serial number [id number] [id number]  Right atrial lead Medtronic 5086 MRI-52 cm serial number [id number]  Right ventricular lead Medtronic 5086 MRI-58 cm serial number [id number]  Procedure details:  After the risks and benefits of the procedure were discussed the patient provided informed consent and was brought to the cardiac cath lab in the fasting state.  The patient was prepped and draped in usual sterile fashion. Local anesthesia with 1% lidocaine was administered to to the left infraclavicular area. A 5-6 cm horizontal incision was made parallel with and 2-3 cm caudal to the left clavicle. Using electrocautery and blunt dissection a prepectoral pocket was created down to the level of the pectoralis  major muscle fascia. The pocket was carefully inspected for hemostasis. An antibiotic-soaked sponge was placed in the pocket.  Under fluoroscopic guidance and using the modified Seldinger technique 2 separate venipunctures were performed to access the left subclavian vein. minimal difficulty was encountered accessing the vein. Two J-tip guidewires were subsequently exchanged for 8 French safe sheaths.  Under fluoroscopic guidance the ventricular lead was advanced to level of the mid to apical right ventricular septum and thet active-fixation helix was deployed. moderate current of injury was seen. Satisfactory pacing and sensing parameters were recorded. There was no evidence of diaphragmatic stimulation at maximum device output. The safe sheath was peeled away and the lead was secured in place with 2-0 silk.  In similar fashion the right atrial lead was advanced to the level of the atrial appendage. The active-fixation helix was deployed. There was prominent current of injury. Good sensing was noted but the pacing threshold was in excess of 3 V at 0.5 ms pulse. Even after waiting for several minutes there is no improvement in the pacing thresholds. The active fixation helix was withdrawn and the lead was repositioned in the anterolateral wall. Satisfactory pacing and sensing parameters were recorded. There was very prominent current of injury. There was no evidence of diaphragmatic stimulation with pacing at maximum device output. The safe sheath was peeled away and the lead was secured in place with 2-0 silk.  The antibiotic-soaked sponge was removed from the pocket. The pocket was flushed with copious amounts of antibiotic solution. Reinspection showed excellent hemostasis..  The ventricular lead was connected to the generator and appropriate ventricular pacing was seen. Subsequently the atrial lead was also connected. Repeat testing of the lead parameters later showed excellent values.  The entire system  was then carefully inserted in the pocket with care been taking that the leads and device assumed a comfortable position without pressure on the incision. Great care was taken that the leads be located deep to the generator. The pocket was then closed in layers using 2 layers of 2-0 Vicryl and cutaneous staples, after which a sterile dressing was applied.  At the end of the procedure the following lead parameters were encountered:  Right atrial lead  sensed P waves 1.4 mV, impedance 908ohms, threshold 1 V at 0.5 ms pulse width.  Right ventricular lead sensed R waves 23.6 mV, impedance 1249 ohms, threshold 1 V at 0.5 ms pulse width.  Thurmon Fair, MD, [facility]  Treatments: PPM  Discharge Exam: Blood pressure 147/74, pulse 65, temperature 97.8 F (36.6 C), temperature source Oral, resp. rate 20, height 5\' 8"  (1.727 m), weight 75.6 kg (166 lb 10.7 oz), SpO2 96.00%.   Disposition: 01-Home or Self Care  Discharge Orders    Future Orders Please Complete By Expires   Diet - low sodium heart healthy      Increase activity slowly      Discharge instructions      Comments:   May remove the dressing tomorrow.     Medication List  As of 03/28/2011  2:34 PM   STOP taking these medications         cefdinir 300 MG capsule  TAKE these medications         acetaminophen 325 MG tablet   Commonly known as: TYLENOL   Take 1-2 tablets (325-650 mg total) by mouth every 4 (four) hours as needed.      alendronate 35 MG tablet   Commonly known as: FOSAMAX   Take 35 mg by mouth every 7 (seven) days. Take with a full glass of water on an empty stomach.patient takes on Sunday      amiodarone 200 MG tablet   Commonly known as: PACERONE   Take 200 mg by mouth 2 (two) times daily.      aspirin EC 325 MG tablet   Take 325 mg by mouth daily.      carvedilol 12.5 MG tablet   Commonly known as: COREG   Take 1 tablet (12.5 mg total) by mouth 2 (two) times daily.      CENTRUM SILVER PO   Take 1  tablet by mouth daily.      glyBURIDE-metformin 2.5-500 MG per tablet   Commonly known as: GLUCOVANCE   Take 0.5 tablets by mouth daily with breakfast.      losartan 50 MG tablet   Commonly known as: COZAAR   Take 1 tablet (50 mg total) by mouth daily.      metFORMIN 500 MG tablet   Commonly known as: GLUCOPHAGE   Take 500 mg by mouth at bedtime.      simvastatin 40 MG tablet   Commonly known as: ZOCOR   Take 40 mg by mouth at bedtime.           Follow-up Information    Follow up with Kirk Ruths, MD. Call in 2 weeks.      Follow up with Thurmon Fair, MD. [facility] office will call you)    Contact information:   [address] [place] [address] [phone]          Signed: Dwana Melena 03/28/2011, 2:34 PM

## 2011-05-13 ENCOUNTER — Emergency Department (HOSPITAL_COMMUNITY): Payer: Medicare Other

## 2011-05-13 ENCOUNTER — Encounter (HOSPITAL_COMMUNITY): Payer: Self-pay

## 2011-05-13 ENCOUNTER — Inpatient Hospital Stay (HOSPITAL_COMMUNITY)
Admission: EM | Admit: 2011-05-13 | Discharge: 2011-05-15 | DRG: 690 | Disposition: A | Discharge status: Home / Self Care | Payer: Medicare Other | Attending: Internal Medicine | Admitting: Internal Medicine

## 2011-05-13 DIAGNOSIS — R42 Dizziness and giddiness: Secondary | ICD-10-CM

## 2011-05-13 DIAGNOSIS — N390 Urinary tract infection, site not specified: Secondary | ICD-10-CM

## 2011-05-13 DIAGNOSIS — I4891 Unspecified atrial fibrillation: Secondary | ICD-10-CM | POA: Diagnosis present

## 2011-05-13 DIAGNOSIS — I4510 Unspecified right bundle-branch block: Secondary | ICD-10-CM | POA: Diagnosis present

## 2011-05-13 DIAGNOSIS — B9689 Other specified bacterial agents as the cause of diseases classified elsewhere: Secondary | ICD-10-CM | POA: Diagnosis present

## 2011-05-13 DIAGNOSIS — I2510 Atherosclerotic heart disease of native coronary artery without angina pectoris: Secondary | ICD-10-CM | POA: Diagnosis present

## 2011-05-13 DIAGNOSIS — Z96659 Presence of unspecified artificial knee joint: Secondary | ICD-10-CM

## 2011-05-13 DIAGNOSIS — Z950 Presence of cardiac pacemaker: Secondary | ICD-10-CM

## 2011-05-13 DIAGNOSIS — I10 Essential (primary) hypertension: Secondary | ICD-10-CM | POA: Diagnosis present

## 2011-05-13 DIAGNOSIS — R269 Unspecified abnormalities of gait and mobility: Secondary | ICD-10-CM | POA: Diagnosis present

## 2011-05-13 DIAGNOSIS — I441 Atrioventricular block, second degree: Secondary | ICD-10-CM | POA: Diagnosis present

## 2011-05-13 DIAGNOSIS — E119 Type 2 diabetes mellitus without complications: Secondary | ICD-10-CM | POA: Diagnosis present

## 2011-05-13 DIAGNOSIS — R531 Weakness: Secondary | ICD-10-CM

## 2011-05-13 DIAGNOSIS — E7800 Pure hypercholesterolemia, unspecified: Secondary | ICD-10-CM | POA: Diagnosis present

## 2011-05-13 HISTORY — DX: Gastro-esophageal reflux disease without esophagitis: K21.9

## 2011-05-13 LAB — URINALYSIS, ROUTINE W REFLEX MICROSCOPIC
Hgb urine dipstick: NEGATIVE
Ketones, ur: NEGATIVE mg/dL
Protein, ur: NEGATIVE mg/dL
Urobilinogen, UA: 1 mg/dL (ref 0.0–1.0)

## 2011-05-13 LAB — COMPREHENSIVE METABOLIC PANEL
ALT: 20 U/L (ref 0–35)
AST: 24 U/L (ref 0–37)
Albumin: 3.6 g/dL (ref 3.5–5.2)
Alkaline Phosphatase: 80 U/L (ref 39–117)
GFR calc Af Amer: 61 mL/min — ABNORMAL LOW (ref >90)
Glucose, Bld: 134 mg/dL — ABNORMAL HIGH (ref 70–99)
Potassium: 3.7 mEq/L (ref 3.5–5.1)
Sodium: 136 mEq/L (ref 135–145)
Total Protein: 6.1 g/dL (ref 6.0–8.3)

## 2011-05-13 LAB — DIFFERENTIAL
Basophils Relative: 0 % (ref 0–1)
Eosinophils Absolute: 0.2 10*3/uL (ref 0.0–0.7)
Eosinophils Relative: 2 % (ref 0–5)
Neutrophils Relative %: 76 % (ref 43–77)

## 2011-05-13 LAB — URINE MICROSCOPIC-ADD ON

## 2011-05-13 LAB — CBC
MCH: 33.5 pg (ref 26.0–34.0)
MCHC: 34.7 g/dL (ref 30.0–36.0)
Platelets: 249 10*3/uL (ref 150–400)

## 2011-05-13 LAB — CARDIAC PANEL(CRET KIN+CKTOT+MB+TROPI)
Relative Index: INVALID (ref 0.0–2.5)
Relative Index: INVALID (ref 0.0–2.5)
Total CK: 54 U/L (ref 7–177)
Total CK: 61 U/L (ref 7–177)

## 2011-05-13 LAB — TSH: TSH: 2.133 u[IU]/mL (ref 0.350–4.500)

## 2011-05-13 LAB — GLUCOSE, CAPILLARY

## 2011-05-13 MED ORDER — METFORMIN HCL 500 MG PO TABS
250.0000 mg | ORAL_TABLET | Freq: Every day | ORAL | Status: DC
  Administered 2011-05-14 – 2011-05-15 (×2): 250 mg via ORAL
  Filled 2011-05-13 (×2): qty 1

## 2011-05-13 MED ORDER — INSULIN ASPART 100 UNIT/ML ~~LOC~~ SOLN: 0.0000 [IU] | Freq: Every day | SUBCUTANEOUS | Status: DC

## 2011-05-13 MED ORDER — ONDANSETRON HCL 4 MG PO TABS
4.0000 mg | ORAL_TABLET | Freq: Four times a day (QID) | ORAL | Status: DC | PRN
  Administered 2011-05-13: 4 mg via ORAL
  Filled 2011-05-13: qty 1

## 2011-05-13 MED ORDER — SIMVASTATIN 20 MG PO TABS
40.0000 mg | ORAL_TABLET | Freq: Every day | ORAL | Status: DC
  Administered 2011-05-13 – 2011-05-14 (×2): 40 mg via ORAL
  Filled 2011-05-13 (×2): qty 2

## 2011-05-13 MED ORDER — CARVEDILOL 12.5 MG PO TABS
12.5000 mg | ORAL_TABLET | Freq: Two times a day (BID) | ORAL | Status: DC
  Administered 2011-05-13: 12.5 mg via ORAL
  Filled 2011-05-13 (×3): qty 1

## 2011-05-13 MED ORDER — CALCIUM CARBONATE ANTACID 500 MG PO CHEW
200.0000 mg | CHEWABLE_TABLET | Freq: Three times a day (TID) | ORAL | Status: DC
  Administered 2011-05-14 – 2011-05-15 (×3): 200 mg via ORAL
  Filled 2011-05-13 (×4): qty 1

## 2011-05-13 MED ORDER — AMIODARONE HCL 200 MG PO TABS
200.0000 mg | ORAL_TABLET | Freq: Every day | ORAL | Status: DC
  Administered 2011-05-13 – 2011-05-15 (×3): 200 mg via ORAL
  Filled 2011-05-13 (×3): qty 1

## 2011-05-13 MED ORDER — DEXTROSE 5 % IV SOLN
1.0000 g | Freq: Once | INTRAVENOUS | Status: AC
  Administered 2011-05-13: 1 g via INTRAVENOUS
  Filled 2011-05-13: qty 10

## 2011-05-13 MED ORDER — ONDANSETRON HCL 4 MG/2ML IJ SOLN: 4.0000 mg | Freq: Four times a day (QID) | INTRAMUSCULAR | Status: DC | PRN

## 2011-05-13 MED ORDER — TRAMADOL HCL 50 MG PO TABS: 50.0000 mg | ORAL_TABLET | Freq: Three times a day (TID) | ORAL | Status: DC | PRN

## 2011-05-13 MED ORDER — HEPARIN SODIUM (PORCINE) 5000 UNIT/ML IJ SOLN
5000.0000 [IU] | Freq: Three times a day (TID) | INTRAMUSCULAR | Status: DC
  Administered 2011-05-13: 5000 [IU] via SUBCUTANEOUS
  Filled 2011-05-13: qty 1

## 2011-05-13 MED ORDER — GLYBURIDE-METFORMIN 2.5-500 MG PO TABS: 0.5000 | ORAL_TABLET | Freq: Every day | ORAL | Status: DC

## 2011-05-13 MED ORDER — METFORMIN HCL ER 500 MG PO TB24
750.0000 mg | ORAL_TABLET | Freq: Every day | ORAL | Status: DC
  Administered 2011-05-14 – 2011-05-15 (×2): 750 mg via ORAL
  Filled 2011-05-13 (×3): qty 1.5

## 2011-05-13 MED ORDER — CARVEDILOL 12.5 MG PO TABS
25.0000 mg | ORAL_TABLET | Freq: Two times a day (BID) | ORAL | Status: DC
  Administered 2011-05-13 – 2011-05-15 (×5): 25 mg via ORAL
  Filled 2011-05-13 (×4): qty 2

## 2011-05-13 MED ORDER — LOSARTAN POTASSIUM 50 MG PO TABS
100.0000 mg | ORAL_TABLET | Freq: Every day | ORAL | Status: DC
  Administered 2011-05-14: 100 mg via ORAL
  Filled 2011-05-13 (×2): qty 2

## 2011-05-13 MED ORDER — SODIUM CHLORIDE 0.9 % IV SOLN: INTRAVENOUS | Status: AC

## 2011-05-13 MED ORDER — SODIUM CHLORIDE 0.9 % IJ SOLN
3.0000 mL | Freq: Two times a day (BID) | INTRAMUSCULAR | Status: DC
  Administered 2011-05-13 – 2011-05-15 (×4): 3 mL via INTRAVENOUS
  Filled 2011-05-13 (×5): qty 3

## 2011-05-13 MED ORDER — INSULIN ASPART 100 UNIT/ML ~~LOC~~ SOLN
0.0000 [IU] | Freq: Three times a day (TID) | SUBCUTANEOUS | Status: DC
  Administered 2011-05-13: 3 [IU] via SUBCUTANEOUS
  Administered 2011-05-14: 4 [IU] via SUBCUTANEOUS
  Administered 2011-05-15: 3 [IU] via SUBCUTANEOUS

## 2011-05-13 MED ORDER — ACETAMINOPHEN 325 MG PO TABS: 325.0000 mg | ORAL_TABLET | ORAL | Status: DC | PRN

## 2011-05-13 MED ORDER — ALENDRONATE SODIUM 35 MG PO TABS: 35.0000 mg | ORAL_TABLET | ORAL | Status: DC

## 2011-05-13 MED ORDER — GLYBURIDE 2.5 MG PO TABS
1.2500 mg | ORAL_TABLET | Freq: Every day | ORAL | Status: DC
  Administered 2011-05-14: 1.25 mg via ORAL
  Filled 2011-05-13: qty 0.5
  Filled 2011-05-13: qty 1
  Filled 2011-05-13: qty 0.5

## 2011-05-13 MED ORDER — ONDANSETRON HCL 4 MG/2ML IJ SOLN: 4.0000 mg | Freq: Three times a day (TID) | INTRAMUSCULAR | Status: AC | PRN

## 2011-05-13 MED ORDER — ASPIRIN EC 325 MG PO TBEC
325.0000 mg | DELAYED_RELEASE_TABLET | Freq: Every day | ORAL | Status: DC
  Administered 2011-05-13 – 2011-05-15 (×3): 325 mg via ORAL
  Filled 2011-05-13 (×3): qty 1

## 2011-05-13 MED ORDER — CIPROFLOXACIN HCL 250 MG PO TABS
500.0000 mg | ORAL_TABLET | Freq: Two times a day (BID) | ORAL | Status: DC
  Administered 2011-05-13 – 2011-05-15 (×5): 500 mg via ORAL
  Filled 2011-05-13 (×5): qty 2

## 2011-05-13 NOTE — Evaluation (Signed)
Physical Therapy Evaluation Patient Details Name: Kathleen [name] MRN: [id number] DOB: [birth year] Today's Date: 05/13/2011 Time: 4098-1191 PT Time Calculation (min): 33 min  PT Assessment / Plan / Recommendation Clinical Impression  A delightful pt who developed sudden onset of weakness and difficulty walking was seen for eval.  Her strength is WNL as is balance.  She is independent with transfers.  She normally walks with no assistive device, but now feels more secure with a walker.  Gait is a bit unsteady with no assistive device.  She should return to prior functional level as infectious process resolves.  We will follow acutely to ensure strength and stability.    PT Assessment  Patient needs continued PT services    Follow Up Recommendations  No PT follow up    Equipment Recommendations  None recommended by PT    Frequency Min 3X/week    Precautions / Restrictions Precautions Precautions: Fall Restrictions Weight Bearing Restrictions: No   Pertinent Vitals/Pain       Mobility  Bed Mobility Bed Mobility: Supine to Sit;Sit to Supine Supine to Sit: 7: Independent Sit to Supine: 7: Independent Transfers Transfers: Sit to Stand;Stand to Sit Sit to Stand: 6: Modified independent (Device/Increase time) Stand to Sit: 6: Modified independent (Device/Increase time) Ambulation/Gait Ambulation/Gait Assistance: 5: Supervision Assistive device: Rolling walker;None Ambulation/Gait Assistance Details: pt is able to ambulate with no assistive device but feels more secure with a walker Gait Pattern: Trunk flexed Gait velocity: WNL Stairs: No Wheelchair Mobility Wheelchair Mobility: No    Exercises     PT Goals Acute Rehab PT Goals PT Goal Formulation: With patient Time For Goal Achievement: 05/20/11 Potential to Achieve Goals: Good Pt will Ambulate: >150 feet;with modified independence;with least restrictive assistive device PT Goal: Ambulate - Progress: Goal set  today Pt will Go Up / Down Stairs: 1-2 stairs;with supervision;with rail(s) PT Goal: Up/Down Stairs - Progress: Goal set today  Visit Information  Last PT Received On: 05/13/11    Subjective Data  Subjective: I feel weak Patient Stated Goal: return to independence   Prior Functioning  Home Living Lives With: Family Available Help at Discharge: Family Type of Home: House Home Access: Stairs to enter Secretary/administrator of Steps: 1 Entrance Stairs-Rails: None;Right Home Layout: One level Bathroom Shower/Tub: Health visitor: Standard Bathroom Accessibility: Yes How Accessible: Accessible via walker Home Adaptive Equipment: Bedside commode/3-in-1;Shower chair with back;Walker - rolling;Straight cane;Grab bars in shower Prior Function Level of Independence: Independent Able to Take Stairs?: Yes Driving: No Vocation: Retired Musician: No difficulties    Cognition  Overall Cognitive Status: Appears within functional limits for tasks assessed/performed Arousal/Alertness: Awake/alert Orientation Level: Appears intact for tasks assessed Behavior During Session: [facility] for tasks performed    Extremity/Trunk Assessment Right Upper Extremity Assessment RUE ROM/Strength/Tone: Within functional levels RUE Sensation: WFL - Light Touch;WFL - Proprioception RUE Coordination: WFL - gross motor Left Upper Extremity Assessment LUE ROM/Strength/Tone: Within functional levels LUE Sensation: WFL - Light Touch;WFL - Proprioception LUE Coordination: WFL - gross motor Right Lower Extremity Assessment RLE ROM/Strength/Tone: Within functional levels RLE Sensation: WFL - Light Touch;WFL - Proprioception RLE Coordination: WFL - gross motor Left Lower Extremity Assessment LLE ROM/Strength/Tone: Within functional levels LLE Sensation: WFL - Light Touch;WFL - Proprioception LLE Coordination: WFL - gross motor Trunk Assessment Trunk Assessment: Kyphotic    Balance Balance Balance Assessed: No (WNL by functional observation)  End of Session PT - End of Session Equipment Utilized During Treatment: Gait belt Activity Tolerance:  Patient tolerated treatment well Patient left: in bed;with call bell/phone within reach;with bed alarm set;with family/visitor present Nurse Communication: Mobility status   Konrad Penta 05/13/2011, 2:23 PM

## 2011-05-13 NOTE — ED Provider Notes (Signed)
History   This chart was scribed for [name], MD by Sofie Rower. The patient was seen in room APA08/APA08 and the patient's care was started at 7:29 AM     CSN: [id number]  Arrival date & time 05/13/11  4098   First MD Initiated Contact with Patient 05/13/11 [phone]      Chief Complaint  Patient presents with  . Weakness  . Nausea    (Consider location/radiation/quality/duration/timing/severity/associated sxs/prior treatment) The history is limited by the condition of the patient (She is a very poor and vague historian).    Kathleen [name] is a 78 y.o. female who presents to the Emergency Department complaining  moderate, episodic nausea onset today with associated symptoms of discolored urine, dizziness, light headedness, weakness. The pt states "all I can say is that my body is not doing good." The pt informs the EDP "I am off balance." The pt states "it is mostly my head, I'm not dizzy, I don't hurt, I'm just light headed." Pt also complains of moderate, episodic fall onset one week ago with associated symptoms of back bruising. The pt relative states "she has gotten very methodical in what she does." Pt relative informs EDP that "she is afraid that she will fall off the side of the bed." Pt relative states "the pt balance is worse today than it has been in the past, at lease twice as worst." Modifying factors include standing up or walking which intensifies the light headedness. Pt has a hx of pacemaker adjustment during November 2012, hypertension, CAD, diabetes, atrial fibrillation. Pt denies spinning sensations, chest pain, chest tightness, chest pressure, balance being "ok" before the visit to the ED, taking blood thinners.  PCP is Dr. Regino Schultze. Cardiologist is Dr. Gevena Mart.   Past Medical History  Diagnosis Date  . Hypertension   . Diabetes mellitus   . High cholesterol   . Atrial fibrillation   . Paroxysmal a-fib 12/13/2010  . PNA (pneumonia)   . CAD (coronary artery  disease) 2006  . Acid reflux     Past Surgical History  Procedure Date  . Replacement total knee   . Bladder tact   . Pacemaker insertion   . Cholecystectomy       History  Substance Use Topics  . Smoking status: Never Smoker   . Smokeless tobacco: Not on file  . Alcohol Use: No    OB History    Grav Para Term Preterm Abortions TAB SAB Ect Mult Living                  Review of Systems  All other systems reviewed and are negative.    10 Systems reviewed and all are negative for acute change except as noted in the HPI.    Allergies  Morphine and Sulfonamide derivatives  Home Medications   Current Outpatient Rx  Name Route Sig Dispense Refill  . TRAMADOL HCL 50 MG PO TABS Oral Take 50 mg by mouth every 8 (eight) hours as needed.    . ACETAMINOPHEN 325 MG PO TABS Oral Take 1-2 tablets (325-650 mg total) by mouth every 4 (four) hours as needed.    . ALENDRONATE SODIUM 35 MG PO TABS Oral Take 35 mg by mouth every 7 (seven) days. Take with a full glass of water on an empty stomach.patient takes on Sunday    . AMIODARONE HCL 200 MG PO TABS Oral Take 200 mg by mouth daily.     . ASPIRIN EC 325 MG PO  TBEC Oral Take 325 mg by mouth daily.    . GLYBURIDE-METFORMIN 2.5-500 MG PO TABS Oral Take 0.5 tablets by mouth 2 (two) times daily with a meal.     . LOSARTAN POTASSIUM 50 MG PO TABS Oral Take 1 tablet (50 mg total) by mouth daily. 30 tablet 5  . METFORMIN HCL 500 MG PO TABS Oral Take 500 mg by mouth 2 (two) times daily with a meal.     . CENTRUM SILVER PO Oral Take 1 tablet by mouth daily.    Marland Kitchen SIMVASTATIN 40 MG PO TABS Oral Take 40 mg by mouth at bedtime.        BP 173/73  Pulse 65  Temp(Src) 98 F (36.7 C) (Oral)  Resp 20  Ht 5\' 6"  (1.676 m)  Wt 174 lb (78.926 kg)  BMI 28.08 kg/m2  SpO2 93%  Physical Exam  Nursing note and vitals reviewed. Constitutional: She is oriented to person, place, and time. She appears well-developed and well-nourished.  HENT:    Head: Normocephalic and atraumatic.  Nose: Nose normal.  Eyes: Conjunctivae and EOM are normal. Right eye exhibits no discharge. Left eye exhibits no discharge.  Neck: Normal range of motion. Neck supple. Carotid bruit is not present.  Cardiovascular: Normal rate and regular rhythm.  Exam reveals no gallop and no friction rub.   No murmur heard. Pulmonary/Chest: Effort normal and breath sounds normal. No respiratory distress. She has no wheezes. She has no rales.  Neurological: She is alert and oriented to person, place, and time.       Rhomberg is generally unsteady without any tendency to fall in any specific direction.  Skin: Skin is warm and dry.  Psychiatric: She has a normal mood and affect. Her behavior is normal.    ED Course  Procedures (including critical care time)  DIAGNOSTIC STUDIES: Oxygen Saturation is 93% on room air, low by my interpretation.    COORDINATION OF CARE:   Results for orders placed during the hospital encounter of 05/13/11  GLUCOSE, CAPILLARY      Component Value Range   Glucose-Capillary 115 (*) 70 - 99 (mg/dL)  CBC      Component Value Range   WBC 8.2  4.0 - 10.5 (K/uL)   RBC 3.46 (*) 3.87 - 5.11 (MIL/uL)   Hemoglobin 11.6 (*) 12.0 - 15.0 (g/dL)   HCT 96.0 (*) 45.4 - 46.0 (%)   MCV 96.5  78.0 - 100.0 (fL)   MCH 33.5  26.0 - 34.0 (pg)   MCHC 34.7  30.0 - 36.0 (g/dL)   RDW 09.8  11.9 - 14.7 (%)   Platelets 249  150 - 400 (K/uL)  DIFFERENTIAL      Component Value Range   Neutrophils Relative 76  43 - 77 (%)   Neutro Abs 6.2  1.7 - 7.7 (K/uL)   Lymphocytes Relative 14  12 - 46 (%)   Lymphs Abs 1.2  0.7 - 4.0 (K/uL)   Monocytes Relative 7  3 - 12 (%)   Monocytes Absolute 0.6  0.1 - 1.0 (K/uL)   Eosinophils Relative 2  0 - 5 (%)   Eosinophils Absolute 0.2  0.0 - 0.7 (K/uL)   Basophils Relative 0  0 - 1 (%)   Basophils Absolute 0.0  0.0 - 0.1 (K/uL)  URINALYSIS, ROUTINE W REFLEX MICROSCOPIC      Component Value Range   Color, Urine YELLOW   YELLOW    APPearance CLEAR  CLEAR  Specific Gravity, Urine 1.010  1.005 - 1.030    pH 7.0  5.0 - 8.0    Glucose, UA NEGATIVE  NEGATIVE (mg/dL)   Hgb urine dipstick NEGATIVE  NEGATIVE    Bilirubin Urine NEGATIVE  NEGATIVE    Ketones, ur NEGATIVE  NEGATIVE (mg/dL)   Protein, ur NEGATIVE  NEGATIVE (mg/dL)   Urobilinogen, UA 1.0  0.0 - 1.0 (mg/dL)   Nitrite POSITIVE (*) NEGATIVE    Leukocytes, UA MODERATE (*) NEGATIVE   CARDIAC PANEL(CRET KIN+CKTOT+MB+TROPI)      Component Value Range   Total CK 54  7 - 177 (U/L)   CK, MB 2.6  0.3 - 4.0 (ng/mL)   Troponin I <0.30  <0.30 (ng/mL)   Relative Index RELATIVE INDEX IS INVALID  0.0 - 2.5   URINE MICROSCOPIC-ADD ON      Component Value Range   Squamous Epithelial / LPF FEW (*) RARE    WBC, UA TOO NUMEROUS TO COUNT  <3 (WBC/hpf)   Bacteria, UA MANY (*) RARE   COMPREHENSIVE METABOLIC PANEL      Component Value Range   Sodium 136  135 - 145 (mEq/L)   Potassium 3.7  3.5 - 5.1 (mEq/L)   Chloride 99  96 - 112 (mEq/L)   CO2 27  19 - 32 (mEq/L)   Glucose, Bld 134 (*) 70 - 99 (mg/dL)   BUN 10  6 - 23 (mg/dL)   Creatinine, Ser 1.61  0.50 - 1.10 (mg/dL)   Calcium 9.0  8.4 - 09.6 (mg/dL)   Total Protein 6.1  6.0 - 8.3 (g/dL)   Albumin 3.6  3.5 - 5.2 (g/dL)   AST 24  0 - 37 (U/L)   ALT 20  0 - 35 (U/L)   Alkaline Phosphatase 80  39 - 117 (U/L)   Total Bilirubin 0.8  0.3 - 1.2 (mg/dL)   GFR calc non Af Amer 53 (*) >90 (mL/min)   GFR calc Af Amer 61 (*) >90 (mL/min)   Ct Head Wo Contrast  05/13/2011  *RADIOLOGY REPORT*  Clinical Data: Dizziness, weakness, nausea  CT HEAD WITHOUT CONTRAST  Technique:  Contiguous axial images were obtained from the base of the skull through the vertex without contrast.  Comparison: 03/15/2011  Findings: No evidence of parenchymal hemorrhage or extra-axial fluid collection. No mass lesion, mass effect, or midline shift.  No CT evidence of acute infarction. Subcortical white matter and periventricular small vessel  ischemic changes.  Mild age related atrophy.  No ventriculomegaly.  The visualized paranasal sinuses are essentially clear. The mastoid air cells are unopacified.  No evidence of calvarial fracture.  IMPRESSION: No evidence of acute intracranial abnormality.  Mild atrophy with small vessel ischemic changes.  Original Report Authenticated By: Charline Bills, M.D.      Date: 05/13/2011  Rate: 64  Rhythm: Electronic ventricular pacemaker  QRS Axis: left  Intervals: normal  ST/T Wave abnormalities: normal  Conduction Disutrbances:none  Narrative Interpretation: Electronic ventricular pacing. When compared with ECG of 03/25/2011, electronic ventricular pacing has replaced normal sinus rhythm with bifascicular block.  Old EKG Reviewed: changes noted  Workup is positive only for urinary tract infection. That may be enough to explain her symptoms. Decision is made to admit her for observation and to obtain an MRI scan. She's given a dose of Rocephin. Case is discussed with Dr. Karilyn Cota who agrees to admit the patient with observation status.  1. Weakness   2. Dizziness   3. Urinary tract infection  7:40AM- EDP at bedside discusses treatment plan concerning EKG, blood samples, urine samples.  8:42AM- recheck. EDP at bedside discusses continued treatment plan.    MDM  Weakness and dizziness with a positive Romberg. Need to consider possibility of posterior circulation stroke. Arguing against stroke is the fact that she does not tender fall in any specific direction. She has an MRI compliant pacemaker, but MRI is with his pacemaker can only be done at [facility]. Old records are reviewed and she had a recent hospitalization for 2-1 AV block for which she received a pacemaker.      I personally performed the services described in this documentation, which was scribed in my presence. The recorded information has been reviewed and considered.      [name], MD 05/13/11 [phone]

## 2011-05-13 NOTE — H&P (Signed)
Kathleen [name] MRN: [id number] DOB/AGE: 76-Aug-1926 76 y.o. Primary Care Physician:MCGOUGH,WILLIAM M, MD, MD Admit date: 05/13/2011 Chief Complaint: Gait abnormality. HPI: This 78 year old lady woke up in the early as of this morning and then found she was unsteady with her gait. She had fallen with this type of dizziness before when she was found to have a 2-1 heart block and before her pacemaker was inserted. However her pacemaker is working just fine. She denies any loss of consciousness, chest pain or palpitations. When she was seen in the emergency room, she did have a urine which looked like she had a UTI. She denies any difficulties.  Past Medical History  Diagnosis Date  . Hypertension   . Diabetes mellitus   . High cholesterol   . Atrial fibrillation   . Paroxysmal a-fib 12/13/2010  . PNA (pneumonia)   . CAD (coronary artery disease) 2006  . Acid reflux    Past Surgical History  Procedure Date  . Replacement total knee   . Bladder tact   . Pacemaker insertion   . Cholecystectomy         No family history on file.  Social History:  reports that she has never smoked. She does not have any smokeless tobacco history on file. She reports that she does not drink alcohol or use illicit drugs.   Allergies:  Allergies  Allergen Reactions  . Morphine Itching  . Sulfonamide Derivatives Hives         BJY:NWGNF from the symptoms mentioned above,there are no other symptoms referable to all systems reviewed.  Physical Exam: Blood pressure 179/67, pulse 60, temperature 98 F (36.7 C), temperature source Oral, resp. rate 22, height 5\' 6"  (1.676 m), weight 78.926 kg (174 lb), SpO2 93.00%. She looks systemically well. Her speech is normal. There are no focal neurological signs. In particular there are no real cerebella signs. Heart sounds are present and appear to be regular. Lung fields are clear. Abdomen is soft and nontender.    Basename 05/13/11 0723  WBC 8.2    NEUTROABS 6.2  HGB 11.6*  HCT 33.4*  MCV 96.5  PLT 249    Basename 05/13/11 0745  NA 136  K 3.7  CL 99  CO2 27  GLUCOSE 134*  BUN 10  CREATININE 0.96  CALCIUM 9.0  MG --         Ct Head Wo Contrast  05/13/2011  *RADIOLOGY REPORT*  Clinical Data: Dizziness, weakness, nausea  CT HEAD WITHOUT CONTRAST  Technique:  Contiguous axial images were obtained from the base of the skull through the vertex without contrast.  Comparison: 03/15/2011  Findings: No evidence of parenchymal hemorrhage or extra-axial fluid collection. No mass lesion, mass effect, or midline shift.  No CT evidence of acute infarction. Subcortical white matter and periventricular small vessel ischemic changes.  Mild age related atrophy.  No ventriculomegaly.  The visualized paranasal sinuses are essentially clear. The mastoid air cells are unopacified.  No evidence of calvarial fracture.  IMPRESSION: No evidence of acute intracranial abnormality.  Mild atrophy with small vessel ischemic changes.  Original Report Authenticated By: Charline Bills, M.D.   Impression: 1. Gait abnormality, unclear etiology. 2. Hypertension, currently uncontrolled. 3. Recent history of 2-1 block, status post pacemaker. 4. Diabetes mellitus. 5. Probable UTI.      Plan: 1. Admit to telemetry. 2. Monitor blood pressure closely. We may need to increase her antihypertensive medications. 3. Stop ciprofloxacin empirically for possible UTI. 4. Neurology consultation. Further recommendations will  depend on hospital progress.       Wilson Singer Pager [phone]  76/30/2013, 10:46 AM

## 2011-05-13 NOTE — ED Notes (Signed)
Having episodes of nausea since pacemaker adjusted on November 2012, during the night she was walking to bathroom and became dizzy.  "just not feeling right", pt's daughter reports that her urine has "a bitter odor".

## 2011-05-13 NOTE — Progress Notes (Signed)

## 2011-05-14 DIAGNOSIS — I10 Essential (primary) hypertension: Secondary | ICD-10-CM

## 2011-05-14 DIAGNOSIS — R269 Unspecified abnormalities of gait and mobility: Secondary | ICD-10-CM

## 2011-05-14 DIAGNOSIS — E782 Mixed hyperlipidemia: Secondary | ICD-10-CM

## 2011-05-14 DIAGNOSIS — E1165 Type 2 diabetes mellitus with hyperglycemia: Secondary | ICD-10-CM

## 2011-05-14 LAB — CARDIAC PANEL(CRET KIN+CKTOT+MB+TROPI)
Relative Index: INVALID (ref 0.0–2.5)
Troponin I: <0.3 ng/mL (ref <0.30)

## 2011-05-14 LAB — GLUCOSE, CAPILLARY: Glucose-Capillary: 189 mg/dL — ABNORMAL HIGH (ref 70–99)

## 2011-05-14 LAB — RPR: RPR Ser Ql: NONREACTIVE

## 2011-05-14 NOTE — Progress Notes (Signed)
   CARE MANAGEMENT NOTE 05/14/2011  Patient:  Kathleen [name], Kathleen [name]   Account Number:  [id number]  Date Initiated:  05/14/2011  Documentation initiated by:  Rosemary Holms  Subjective/Objective Assessment:   Pt admitted from home where she lives with his daughter. States she fell over her DME bathtub equipment "being too careful, I guess". PTA has had HH after a knee replacement and has a good bit of DME from that episode.     Action/Plan:   Spoke with Pt and daughter at bedside. No HH needs identified at this time.   Anticipated DC Date:  05/15/2011   Anticipated DC Plan:  HOME/SELF CARE      DC Planning Services  CM consult      Choice offered to / List presented to:             Status of service:  In process, will continue to follow Medicare Important Message given?   (If response is "NO", the following Medicare IM given date fields will be blank) Date Medicare IM given:   Date Additional Medicare IM given:    Discharge Disposition:    Per UR Regulation:    If discussed at Long Length of Stay Meetings, dates discussed:    Comments:  05/14/11 1100 Joyceann Kruser Leanord Hawking RN BSN CM

## 2011-05-14 NOTE — Progress Notes (Signed)
Physical Therapy Treatment Patient Details Name: Kathleen [name] MRN: [id number] DOB: October 03, 1924 Today's Date: 05/14/2011 Time: 4098-1191 PT Time Calculation (min): 17 min Charges:  Gait 15'  PT Assessment / Plan / Recommendation Comments on Treatment Session  Pt. with good gait speed and stability this session and appears to have good functional strength.  Encouraged pt. to attempt ambulation without AD next visit.  Pt. only requires safety cues with general mobility.  Therex not performed as pt. returned to eating; RN present with meds.               Plan  (Continue towards goals set by evaluating therapist.)    Precautions / Restrictions Restrictions Weight Bearing Restrictions: No        Mobility  Bed Mobility Bed Mobility: Supine to Sit Supine to Sit: 7: Independent Sit to Supine: 7: Independent Transfers Transfers: Sit to Stand;Stand to Sit;Stand Pivot Transfers Sit to Stand: 6: Modified independent (Device/Increase time) Stand to Sit: 6: Modified independent (Device/Increase time) Stand Pivot Transfers: 6: Modified independent (Device/Increase time) Details for Transfer Assistance: Pt. requires verbal cues for safety and using UE's to prevent flopping when going to seated position. Ambulation/Gait Ambulation/Gait Assistance: 5: Supervision Ambulation Distance (Feet): 100 Feet Assistive device: Rolling walker Ambulation/Gait Assistance Details: Attempted no AD, however pt. stated she felt too weak and unsteady.  Pt. without LOB while amb. without AD. Gait Pattern: Trunk flexed Gait velocity: WNL     Visit Information  Last PT Received On: 05/14/11    Subjective Data  Subjective: Pt. attempting to eat breakfast with daughter present.  Pt. reports she still feels weak and is too nauseated to eat; suggested activity to help increase appetitie/decrease nausea and pt. willing to try.  No reports of pain today.         End of Session PT - End of Session Equipment  Utilized During Treatment: Gait belt Activity Tolerance: Patient tolerated treatment well Patient left: in chair;with family/visitor present;with call bell/phone within reach    Seneca Gadbois B. Bascom Levels, PTA 05/14/2011, 8:42 AM

## 2011-05-14 NOTE — Consult Note (Signed)
Reason for Consult: Referring Physician:  PEARLEY [name] is an 78 y.o. female.  HPI:   Past Medical History  Diagnosis Date  . Hypertension   . Diabetes mellitus   . High cholesterol   . Atrial fibrillation   . Paroxysmal a-fib 12/13/2010  . PNA (pneumonia)   . CAD (coronary artery disease) 2006  . Acid reflux     Past Surgical History  Procedure Date  . Replacement total knee   . Bladder tact   . Pacemaker insertion   . Cholecystectomy     History reviewed. No pertinent family history.  Social History:  reports that she has never smoked. She does not have any smokeless tobacco history on file. She reports that she does not drink alcohol or use illicit drugs.  Allergies:  Allergies  Allergen Reactions  . Morphine Itching  . Sulfonamide Derivatives Hives    Medications:  Prior to Admission medications   Medication Sig Start Date End Date Taking? Authorizing Provider  acetaminophen (TYLENOL) 325 MG tablet Take 1-2 tablets (325-650 mg total) by mouth every 4 (four) hours as needed. 03/26/11 03/25/12 Yes Dwana Melena, PA  aspirin EC 325 MG tablet Take 325 mg by mouth daily.   Yes Historical Provider, MD  carvedilol (COREG) 25 MG tablet Take 25 mg by mouth 2 (two) times daily.   Yes Historical Provider, MD  glyBURIDE-metformin (GLUCOVANCE) 2.5-500 MG per tablet Take 0.5 tablets by mouth daily with breakfast.    Yes Historical Provider, MD  losartan (COZAAR) 50 MG tablet Take 100 mg by mouth at bedtime.   Yes Historical Provider, MD  METFORMIN HCL ER PO Take 500 mg by mouth daily with supper.   Yes Historical Provider, MD  Multiple Vitamins-Minerals (CENTRUM SILVER PO) Take 1 tablet by mouth daily.   Yes Historical Provider, MD  simvastatin (ZOCOR) 40 MG tablet Take 40 mg by mouth at bedtime.     Yes Historical Provider, MD  traMADol (ULTRAM) 50 MG tablet Take 50-100 mg by mouth every 8 (eight) hours as needed. Leg pain   Yes Historical Provider, MD  alendronate (FOSAMAX)  35 MG tablet Take 35 mg by mouth every 7 (seven) days. Take with a full glass of water on an empty stomach.patient takes on Sunday    Historical Provider, MD  amiodarone (PACERONE) 200 MG tablet Take 200 mg by mouth daily.     Historical Provider, MD    Scheduled Meds:   . sodium chloride   Intravenous STAT  . amiodarone  200 mg Oral Daily  . aspirin EC  325 mg Oral Daily  . calcium carbonate  200 mg of elemental calcium Oral TID WC  . carvedilol  25 mg Oral BID  . ciprofloxacin  500 mg Oral BID  . glyBURIDE  1.25 mg Oral Q breakfast   And  . metFORMIN  250 mg Oral Q breakfast  . insulin aspart  0-20 Units Subcutaneous TID WC  . insulin aspart  0-5 Units Subcutaneous QHS  . losartan  100 mg Oral QHS  . metFORMIN  750 mg Oral Q breakfast  . simvastatin  40 mg Oral QHS  . sodium chloride  3 mL Intravenous Q12H  . DISCONTD: carvedilol  12.5 mg Oral BID   Continuous Infusions:  PRN Meds:.acetaminophen, ondansetron (ZOFRAN) IV, ondansetron (ZOFRAN) IV, ondansetron, traMADol   Results for orders placed during the hospital encounter of 05/13/11 (from the past 48 hour(s))  GLUCOSE, CAPILLARY     Status: Abnormal  Collection Time   05/13/11  7:03 AM      Component Value Range Comment   Glucose-Capillary 115 (*) 70 - 99 (mg/dL)   URINE CULTURE     Status: Normal (Preliminary result)   Collection Time   05/13/11  7:18 AM      Component Value Range Comment   Specimen Description URINE, CLEAN CATCH      Special Requests NONE      Culture  Setup Time [id number]      Colony Count >=100,000 COLONIES/ML      Culture GRAM NEGATIVE RODS      Report Status PENDING     URINALYSIS, ROUTINE W REFLEX MICROSCOPIC     Status: Abnormal   Collection Time   05/13/11  7:19 AM      Component Value Range Comment   Color, Urine YELLOW  YELLOW     APPearance CLEAR  CLEAR     Specific Gravity, Urine 1.010  1.005 - 1.030     pH 7.0  5.0 - 8.0     Glucose, UA NEGATIVE  NEGATIVE (mg/dL)    Hgb urine  dipstick NEGATIVE  NEGATIVE     Bilirubin Urine NEGATIVE  NEGATIVE     Ketones, ur NEGATIVE  NEGATIVE (mg/dL)    Protein, ur NEGATIVE  NEGATIVE (mg/dL)    Urobilinogen, UA 1.0  0.0 - 1.0 (mg/dL)    Nitrite POSITIVE (*) NEGATIVE     Leukocytes, UA MODERATE (*) NEGATIVE    URINE MICROSCOPIC-ADD ON     Status: Abnormal   Collection Time   05/13/11  7:19 AM      Component Value Range Comment   Squamous Epithelial / LPF FEW (*) RARE     WBC, UA TOO NUMEROUS TO COUNT  <3 (WBC/hpf)    Bacteria, UA MANY (*) RARE    CBC     Status: Abnormal   Collection Time   05/13/11  7:23 AM      Component Value Range Comment   WBC 8.2  4.0 - 10.5 (K/uL)    RBC 3.46 (*) 3.87 - 5.11 (MIL/uL)    Hemoglobin 11.6 (*) 12.0 - 15.0 (g/dL)    HCT 81.1 (*) 91.4 - 46.0 (%)    MCV 96.5  78.0 - 100.0 (fL)    MCH 33.5  26.0 - 34.0 (pg)    MCHC 34.7  30.0 - 36.0 (g/dL)    RDW 78.2  95.6 - 21.3 (%)    Platelets 249  150 - 400 (K/uL)   DIFFERENTIAL     Status: Normal   Collection Time   05/13/11  7:23 AM      Component Value Range Comment   Neutrophils Relative 76  43 - 77 (%)    Neutro Abs 6.2  1.7 - 7.7 (K/uL)    Lymphocytes Relative 14  12 - 46 (%)    Lymphs Abs 1.2  0.7 - 4.0 (K/uL)    Monocytes Relative 7  3 - 12 (%)    Monocytes Absolute 0.6  0.1 - 1.0 (K/uL)    Eosinophils Relative 2  0 - 5 (%)    Eosinophils Absolute 0.2  0.0 - 0.7 (K/uL)    Basophils Relative 0  0 - 1 (%)    Basophils Absolute 0.0  0.0 - 0.1 (K/uL)   CARDIAC PANEL(CRET KIN+CKTOT+MB+TROPI)     Status: Normal   Collection Time   05/13/11  7:40 AM      Component Value Range Comment  Total CK 54  7 - 177 (U/L)    CK, MB 2.6  0.3 - 4.0 (ng/mL)    Troponin I <0.30  <0.30 (ng/mL)    Relative Index RELATIVE INDEX IS INVALID  0.0 - 2.5    COMPREHENSIVE METABOLIC PANEL     Status: Abnormal   Collection Time   05/13/11  7:45 AM      Component Value Range Comment   Sodium 136  135 - 145 (mEq/L)    Potassium 3.7  3.5 - 5.1 (mEq/L)     Chloride 99  96 - 112 (mEq/L)    CO2 27  19 - 32 (mEq/L)    Glucose, Bld 134 (*) 70 - 99 (mg/dL)    BUN 10  6 - 23 (mg/dL)    Creatinine, Ser 2.50  0.50 - 1.10 (mg/dL)    Calcium 9.0  8.4 - 10.5 (mg/dL)    Total Protein 6.1  6.0 - 8.3 (g/dL)    Albumin 3.6  3.5 - 5.2 (g/dL)    AST 24  0 - 37 (U/L)    ALT 20  0 - 35 (U/L)    Alkaline Phosphatase 80  39 - 117 (U/L)    Total Bilirubin 0.8  0.3 - 1.2 (mg/dL)    GFR calc non Af Amer 53 (*) >90 (mL/min)    GFR calc Af Amer 61 (*) >90 (mL/min)   TSH     Status: Normal   Collection Time   05/13/11 12:53 PM      Component Value Range Comment   TSH 2.133  0.350 - 4.500 (uIU/mL)   CARDIAC PANEL(CRET KIN+CKTOT+MB+TROPI)     Status: Normal   Collection Time   05/13/11 12:53 PM      Component Value Range Comment   Total CK 61  7 - 177 (U/L)    CK, MB 2.8  0.3 - 4.0 (ng/mL)    Troponin I <0.30  <0.30 (ng/mL)    Relative Index RELATIVE INDEX IS INVALID  0.0 - 2.5    GLUCOSE, CAPILLARY     Status: Abnormal   Collection Time   05/13/11  4:43 PM      Component Value Range Comment   Glucose-Capillary 134 (*) 70 - 99 (mg/dL)   CARDIAC PANEL(CRET KIN+CKTOT+MB+TROPI)     Status: Normal   Collection Time   05/13/11  8:27 PM      Component Value Range Comment   Total CK 53  7 - 177 (U/L)    CK, MB 2.6  0.3 - 4.0 (ng/mL)    Troponin I <0.30  <0.30 (ng/mL)    Relative Index RELATIVE INDEX IS INVALID  0.0 - 2.5    GLUCOSE, CAPILLARY     Status: Normal   Collection Time   05/13/11  9:30 PM      Component Value Range Comment   Glucose-Capillary 90  70 - 99 (mg/dL)    Comment 1 Notify RN     CARDIAC PANEL(CRET KIN+CKTOT+MB+TROPI)     Status: Normal   Collection Time   05/14/11  4:27 AM      Component Value Range Comment   Total CK 42  7 - 177 (U/L)    CK, MB 2.1  0.3 - 4.0 (ng/mL)    Troponin I <0.30  <0.30 (ng/mL)    Relative Index RELATIVE INDEX IS INVALID  0.0 - 2.5    GLUCOSE, CAPILLARY     Status: Abnormal   Collection Time   05/14/11  7:53 AM        Component Value Range Comment   Glucose-Capillary 108 (*) 70 - 99 (mg/dL)    Comment 1 Notify RN     VITAMIN B12     Status: Normal   Collection Time   05/14/11 11:00 AM      Component Value Range Comment   Vitamin B-12 499  211 - 911 (pg/mL)   GLUCOSE, CAPILLARY     Status: Abnormal   Collection Time   05/14/11 11:28 AM      Component Value Range Comment   Glucose-Capillary 189 (*) 70 - 99 (mg/dL)    Comment 1 Notify RN     GLUCOSE, CAPILLARY     Status: Abnormal   Collection Time   05/14/11  4:52 PM      Component Value Range Comment   Glucose-Capillary 60 (*) 70 - 99 (mg/dL)    Comment 1 Notify RN      Comment 2 Documented in Chart     GLUCOSE, CAPILLARY     Status: Abnormal   Collection Time   05/14/11  6:11 PM      Component Value Range Comment   Glucose-Capillary 123 (*) 70 - 99 (mg/dL)    Comment 1 Notify RN      Comment 2 Documented in Chart       Ct Head Wo Contrast  05/13/2011  *RADIOLOGY REPORT*  Clinical Data: Dizziness, weakness, nausea  CT HEAD WITHOUT CONTRAST  Technique:  Contiguous axial images were obtained from the base of the skull through the vertex without contrast.  Comparison: 03/15/2011  Findings: No evidence of parenchymal hemorrhage or extra-axial fluid collection. No mass lesion, mass effect, or midline shift.  No CT evidence of acute infarction. Subcortical white matter and periventricular small vessel ischemic changes.  Mild age related atrophy.  No ventriculomegaly.  The visualized paranasal sinuses are essentially clear. The mastoid air cells are unopacified.  No evidence of calvarial fracture.  IMPRESSION: No evidence of acute intracranial abnormality.  Mild atrophy with small vessel ischemic changes.  Original Report Authenticated By: Charline Bills, M.D.    Review of Systems  Constitutional: Negative.   HENT: Negative.   Respiratory: Negative.   Cardiovascular: Negative.   Gastrointestinal: Positive for nausea and abdominal pain.   Genitourinary: Negative.   Musculoskeletal: Positive for falls.  Skin: Negative.   Psychiatric/Behavioral: Negative.    Blood pressure 157/67, pulse 62, temperature 98.5 F (36.9 C), temperature source Oral, resp. rate 16, height 5\' 6"  (1.676 m), weight 78.9 kg (173 lb 15.1 oz), SpO2 91.00%. Physical Exam  Assessment/Plan: See dict  Korrine Sicard 05/14/2011, 8:00 PM

## 2011-05-14 NOTE — Progress Notes (Signed)
UR Chart Review Completed  

## 2011-05-14 NOTE — Progress Notes (Signed)
Subjective: Does not report any further dizziness.  PT notes reviewed. No new complaints, family at bedside  Objective: Vital signs in last 24 hours: Temp:  [97.8 F (36.6 C)-98.5 F (36.9 C)] 98.5 F (36.9 C) (05/01 0641) Pulse Rate:  [61-67] 62  (05/01 0641) Resp:  [16-20] 16  (05/01 0641) BP: (137-185)/(61-80) 157/67 mmHg (05/01 0641) SpO2:  [91 %-94 %] 91 % (05/01 0649) Weight:  [78.9 kg (173 lb 15.1 oz)] 78.9 kg (173 lb 15.1 oz) (04/30 1621) Weight change:  Last BM Date: 05/12/11  Intake/Output from previous day: 04/30 0701 - 05/01 0700 In: 440 [P.O.:440] Out: -      Physical Exam: General: Alert, awake, oriented x3, in no acute distress. HEENT: No bruits, no goiter. Heart: Regular rate and rhythm, without murmurs, rubs, gallops. Lungs: Clear to auscultation bilaterally. Abdomen: Soft, nontender, nondistended, positive bowel sounds. Extremities: No clubbing cyanosis or edema with positive pedal pulses. Neuro: Grossly intact, nonfocal.    Lab Results: Basic Metabolic Panel:  Basename 05/13/11 0745  NA 136  K 3.7  CL 99  CO2 27  GLUCOSE 134*  BUN 10  CREATININE 0.96  CALCIUM 9.0  MG --  PHOS --   Liver Function Tests:  Basename 05/13/11 0745  AST 24  ALT 20  ALKPHOS 80  BILITOT 0.8  PROT 6.1  ALBUMIN 3.6   No results found for this basename: LIPASE:2,AMYLASE:2 in the last 72 hours No results found for this basename: AMMONIA:2 in the last 72 hours CBC:  Basename 05/13/11 0723  WBC 8.2  NEUTROABS 6.2  HGB 11.6*  HCT 33.4*  MCV 96.5  PLT 249   Cardiac Enzymes:  Basename 05/14/11 0427 05/13/11 2027 05/13/11 1253  CKTOTAL 42 53 61  CKMB 2.1 2.6 2.8  CKMBINDEX -- -- --  TROPONINI <0.30 <0.30 <0.30   BNP: No results found for this basename: PROBNP:3 in the last 72 hours D-Dimer: No results found for this basename: DDIMER:2 in the last 72 hours CBG:  Basename 05/14/11 0753 05/13/11 2130 05/13/11 1643 05/13/11 0703  GLUCAP 108* 90 134*  115*   Hemoglobin A1C: No results found for this basename: HGBA1C in the last 72 hours Fasting Lipid Panel: No results found for this basename: CHOL,HDL,LDLCALC,TRIG,CHOLHDL,LDLDIRECT in the last 72 hours Thyroid Function Tests:  Basename 05/13/11 1253  TSH 2.133  T4TOTAL --  FREET4 --  T3FREE --  THYROIDAB --   Anemia Panel: No results found for this basename: VITAMINB12,FOLATE,FERRITIN,TIBC,IRON,RETICCTPCT in the last 72 hours Coagulation: No results found for this basename: LABPROT:2,INR:2 in the last 72 hours Urine Drug Screen: Drugs of Abuse  No results found for this basename: labopia, cocainscrnur, labbenz, amphetmu, thcu, labbarb    Alcohol Level: No results found for this basename: ETH:2 in the last 72 hours Urinalysis:  Basename 05/13/11 0719  COLORURINE YELLOW  LABSPEC 1.010  PHURINE 7.0  GLUCOSEU NEGATIVE  HGBUR NEGATIVE  BILIRUBINUR NEGATIVE  KETONESUR NEGATIVE  PROTEINUR NEGATIVE  UROBILINOGEN 1.0  NITRITE POSITIVE*  LEUKOCYTESUR MODERATE*    No results found for this or any previous visit (from the past 240 hour(s)).  Studies/Results: Ct Head Wo Contrast  05/13/2011  *RADIOLOGY REPORT*  Clinical Data: Dizziness, weakness, nausea  CT HEAD WITHOUT CONTRAST  Technique:  Contiguous axial images were obtained from the base of the skull through the vertex without contrast.  Comparison: 03/15/2011  Findings: No evidence of parenchymal hemorrhage or extra-axial fluid collection. No mass lesion, mass effect, or midline shift.  No CT evidence  of acute infarction. Subcortical white matter and periventricular small vessel ischemic changes.  Mild age related atrophy.  No ventriculomegaly.  The visualized paranasal sinuses are essentially clear. The mastoid air cells are unopacified.  No evidence of calvarial fracture.  IMPRESSION: No evidence of acute intracranial abnormality.  Mild atrophy with small vessel ischemic changes.  Original Report Authenticated By: Charline Bills, M.D.    Medications: Scheduled Meds:   . sodium chloride   Intravenous STAT  . amiodarone  200 mg Oral Daily  . aspirin EC  325 mg Oral Daily  . calcium carbonate  200 mg of elemental calcium Oral TID WC  . carvedilol  12.5 mg Oral BID  . carvedilol  25 mg Oral BID  . cefTRIAXone (ROCEPHIN) IVPB 1 gram/50 mL D5W  1 g Intravenous Once  . ciprofloxacin  500 mg Oral BID  . glyBURIDE  1.25 mg Oral Q breakfast   And  . metFORMIN  250 mg Oral Q breakfast  . insulin aspart  0-20 Units Subcutaneous TID WC  . insulin aspart  0-5 Units Subcutaneous QHS  . losartan  100 mg Oral QHS  . metFORMIN  750 mg Oral Q breakfast  . simvastatin  40 mg Oral QHS  . sodium chloride  3 mL Intravenous Q12H  . DISCONTD: alendronate  35 mg Oral Q7 days  . DISCONTD: glyBURIDE-metformin  0.5 tablet Oral Q breakfast  . DISCONTD: heparin  5,000 Units Subcutaneous Q8H   Continuous Infusions:  PRN Meds:.acetaminophen, ondansetron (ZOFRAN) IV, ondansetron (ZOFRAN) IV, ondansetron, traMADol  Assessment/Plan:  1. Dizziness.  Possibly related to urinary tract infection, her CT head was negative for acute findings.  TSH was found to be normal.  Will check B12, and RPR.  Her neurologic exam does not show any focal findings.  Neurology consultation has been requested.  Will defer need for MRI brain to neurology.  If this is felt necessary, it would need to be done at [place] due to her pacemaker.  2. Poss uti,  On ciprofloxacin, follow up cultures  3.  Diabetes, stable  4. Hypertension, stable  If no further work up is recommended by neurology, then anticipate discharge in the next 24 hours   LOS: 1 day   Jasira Robinson [facility] Pager: [phone] 05/14/2011, 10:34 AM

## 2011-05-15 ENCOUNTER — Inpatient Hospital Stay (HOSPITAL_COMMUNITY): Payer: Medicare Other

## 2011-05-15 DIAGNOSIS — E1165 Type 2 diabetes mellitus with hyperglycemia: Secondary | ICD-10-CM

## 2011-05-15 DIAGNOSIS — E782 Mixed hyperlipidemia: Secondary | ICD-10-CM

## 2011-05-15 DIAGNOSIS — I10 Essential (primary) hypertension: Secondary | ICD-10-CM

## 2011-05-15 DIAGNOSIS — R269 Unspecified abnormalities of gait and mobility: Secondary | ICD-10-CM

## 2011-05-15 LAB — GLUCOSE, CAPILLARY: Glucose-Capillary: 142 mg/dL — ABNORMAL HIGH (ref 70–99)

## 2011-05-15 LAB — URINE CULTURE
Colony Count: >=100000
Culture  Setup Time: 201304301250

## 2011-05-15 MED ORDER — CIPROFLOXACIN HCL 500 MG PO TABS: 500.0000 mg | ORAL_TABLET | Freq: Two times a day (BID) | ORAL | Status: AC

## 2011-05-15 MED ORDER — MECLIZINE HCL 50 MG PO TABS: 25.0000 mg | ORAL_TABLET | Freq: Three times a day (TID) | ORAL | Status: AC | PRN

## 2011-05-15 MED ORDER — ONDANSETRON HCL 4 MG PO TABS: 4.0000 mg | ORAL_TABLET | Freq: Four times a day (QID) | ORAL | Status: AC | PRN

## 2011-05-15 MED ORDER — AMLODIPINE BESYLATE 5 MG PO TABS: 5.0000 mg | ORAL_TABLET | Freq: Every day | ORAL | Status: DC

## 2011-05-15 MED ORDER — PANTOPRAZOLE SODIUM 40 MG PO TBEC: 40.0000 mg | DELAYED_RELEASE_TABLET | Freq: Every day | ORAL | Status: DC

## 2011-05-15 NOTE — Discharge Instructions (Signed)

## 2011-05-15 NOTE — Consult Note (Signed)
Kathleen [name], Kathleen [name]             ACCOUNT NO.:  [id number]  MEDICAL RECORD NO.:  [id number]  LOCATION:                                 FACILITY:  PHYSICIAN:  Keondre Markson A. Gerilyn Pilgrim, M.D. DATE OF BIRTH:  12-Feb-1924  DATE OF CONSULTATION:  05/14/2011 DATE OF DISCHARGE:                                CONSULTATION   This is an elderly 78 year old white female, who has been having spells of dizziness described as lightheadedness associated with nausea. She, however, appears to have developed unusual spell today with her head just not feeling right.  She reports lightheadedness.  She does not report headaches.  She does not report any spinning sensation, although she had a brief moment of false motion, looking on the wall as if the numbers on the clock were moving to the right.  She denies any focal or neurological deficit.  There is no dysarthria, dysphagia, shortness of breath or chest pain.  She has had previous spells of syncope and gait impairment associated with secondary heart block and atrial fibrillation.  She has a history of paroxysmal atrial fibrillation, not on warfarin therapy and apparently she refuses due intra-articular bleeding in the past.  She was placed on a pacemaker because of developing heart block.  The patient's workup included a head CT scan and labs.  The labs are significant for a urinary tract infection both by urinalysis and culture, final identification is pending on the culture.  The patient reports that the dizziness spells occur mostly essentially on standing or walking around, although she did have one event in the hospital last night, where she has felt little sick and lightheaded laying in bed.  PHYSICAL EXAMINATION:  VITAL SIGNS:  Checked, these were fine; systolic blood pressure 159, and glucose was 90. GENERAL:  She is a pleasant, average-weighted lady, in no acute distress. HEENT:  Head is normocephalic, atraumatic. NECK:  Evaluation of the neck is  supple. EXTREMITIES:  Significant osteoarthritic change of the hands and knees bilaterally status post right knee surgery. NEUROLOGIC:  She is awake and alert.  She has reduced hearing, especially on the left.  She is lucid and coherent.  Speech, language, and cognition are intact.  Cranial nerves evaluation shows, what appears to be, a mild right afferent pupillary defect.  Both pupils are 4 mm and reactive.  Extraocular movements are intact.  The visual fields are full.  Facial muscle strength is symmetric.  Tongue is midline.  Uvula midline.  Shoulder shrug is normal.  Motor examination shows normal tone, bulk, and strength.  There is no pronator drift.  Coordination shows no dysmetria, no past-pointing.  Reflexes are 2+ throughout. Sensation normal to light touch.  IMPRESSION:  Acute onset of nausea and lightheadedness, although she appears to have been having these spells before, she has gotten worse on the day of admission.  She does have a urinary tract infection which can be associated with these symptoms.  The episodic nature is concerning for possible unusual seizures/complex partial seizure.  She also seem to have a lot of nausea with these events and sometimes some swallowing problems raising a possibility that she may have some esophageal strictures.  RECOMMENDATIONS:  1. I think she should see a gastroenterologist, this can be done on an     outpatient basis. 2. EEG which also can be done on an outpatient basis. 3. Continue with aspirin.  Her examination is nonfocal.  CT scan is     negative.  I doubt that she has had a stroke at least based upon     physical examination.  Certainly, she may have had a tiny one not     seen in CT scan. 4. Carotid duplex Doppler.  Follow up in the office with [place] in about 2     weeks.  Thanks for this consultation.     Cristino Degroff A. Gerilyn Pilgrim, M.D.     KAD/MEDQ  D:  05/14/2011  T:  05/14/2011  Job:  [id number]

## 2011-05-15 NOTE — Progress Notes (Signed)
1730-Pt. Discharged to home with daughter, pt. Taken to car via w/c. Discharge instructions given to pt. And pt.'s daughter with understanding verbalized.

## 2011-05-15 NOTE — Progress Notes (Signed)
Physical Therapy Treatment Patient Details Name: Kathleen [name] MRN: [id number] DOB: 1924-10-05 Today's Date: 05/15/2011 Time: 5621-3086 PT Time Calculation (min): 18 min  PT Assessment / Plan / Recommendation Comments on Treatment Session  Pt increased gait distance today with RW 125'; min guard. Patient states she is "concerned about her lack of endurance and using the RW gives her more confidence". During standing LE exercises pt had difficulty mainitain balance without UE suport RW use highly recommended. End of session pt was fatigued and wanted to lie down    Follow Up Recommendations       Equipment Recommendations       Frequency     Plan      Precautions / Restrictions Restrictions Weight Bearing Restrictions: No   Pertinent Vitals/Pain     Mobility  Bed Mobility Supine to Sit: 7: Independent Sit to Supine: 7: Independent Transfers Sit to Stand: 6: Modified independent (Device/Increase time) Stand to Sit: 6: Modified independent (Device/Increase time) Ambulation/Gait Ambulation/Gait Assistance: 5: Supervision Ambulation Distance (Feet): 125 Feet Assistive device: Rolling walker Gait Pattern: Trunk flexed Gait velocity: slow Stairs: Yes Stairs Assistance: 4: Min guard Stair Management Technique: One rail Right (HHA ) Number of Stairs: 2  Wheelchair Mobility Wheelchair Mobility: No    Exercises General Exercises - Upper Extremity Shoulder Horizontal ABduction: Both;10 reps Shoulder Horizontal ADduction: 10 reps;Both General Exercises - Lower Extremity Long Arc Quad: Both;10 reps Toe Raises: Both;10 reps Heel Raises: 10 reps;Both Other Exercises Other Exercises: standing hip flex and abd x8 bilaterally without UE support: unsteady but no LOB   PT Goals Acute Rehab PT Goals PT Goal: Ambulate - Progress: Progressing toward goal (RW recommended) PT Goal: Up/Down Stairs - Progress: Met  Visit Information  Last PT Received On: 05/15/11    Subjective  Data      Cognition       Balance     End of Session PT - End of Session Equipment Utilized During Treatment: Gait belt Activity Tolerance: Patient tolerated treatment well Patient left: in bed;with call bell/phone within reach;with family/visitor present    Rasha Ibe ATKINSO 05/15/2011, 9:39 AM

## 2011-05-15 NOTE — Discharge Summary (Signed)
Physician Discharge Summary  Patient ID: Kathleen [name] MRN: [id number] DOB/AGE: [birth year] 78 y.o.  Admit date: 05/13/2011 Discharge date: 05/15/2011  Primary Care Physician:  Kirk Ruths, MD, MD   Discharge Diagnoses:    Principal Problem:  Enterobacter UTI, present on admission Active Problems: Dizziness, likely due to UTI  DIABETES  Hypertension  Symptomatic advanced heart block, 2:1 AV block, RT BBB, Lt post. fas. Block. S/p pacer    Medication List  As of 05/15/2011  4:16 PM   STOP taking these medications         glyBURIDE-metformin 2.5-500 MG per tablet         TAKE these medications         acetaminophen 325 MG tablet   Commonly known as: TYLENOL   Take 1-2 tablets (325-650 mg total) by mouth every 4 (four) hours as needed.      alendronate 35 MG tablet   Commonly known as: FOSAMAX   Take 35 mg by mouth every 7 (seven) days. Take with a full glass of water on an empty stomach.patient takes on Sunday      amiodarone 200 MG tablet   Commonly known as: PACERONE   Take 200 mg by mouth daily.      amLODipine 5 MG tablet   Commonly known as: NORVASC   Take 1 tablet (5 mg total) by mouth daily.      aspirin EC 325 MG tablet   Take 325 mg by mouth daily.      carvedilol 25 MG tablet   Commonly known as: COREG   Take 25 mg by mouth 2 (two) times daily.      CENTRUM SILVER PO   Take 1 tablet by mouth daily.      ciprofloxacin 500 MG tablet   Commonly known as: CIPRO   Take 1 tablet (500 mg total) by mouth 2 (two) times daily.      losartan 50 MG tablet   Commonly known as: COZAAR   Take 100 mg by mouth at bedtime.      meclizine 50 MG tablet   Commonly known as: ANTIVERT   Take 0.5 tablets (25 mg total) by mouth 3 (three) times daily as needed for dizziness.      METFORMIN HCL ER PO   Take 500 mg by mouth daily with supper.      ondansetron 4 MG tablet   Commonly known as: ZOFRAN   Take 1 tablet (4 mg total) by mouth every 6 (six) hours as  needed for nausea.      pantoprazole 40 MG tablet   Commonly known as: PROTONIX   Take 1 tablet (40 mg total) by mouth daily.      simvastatin 40 MG tablet   Commonly known as: ZOCOR   Take 40 mg by mouth at bedtime.      traMADol 50 MG tablet   Commonly known as: ULTRAM   Take 50-100 mg by mouth every 8 (eight) hours as needed. Leg pain           Discharge Exam: Blood pressure 155/70, pulse 58, temperature 98.1 F (36.7 C), temperature source Oral, resp. rate 16, height 5\' 6"  (1.676 m), weight 78.9 kg (173 lb 15.1 oz), SpO2 91.00%. NAD CTA B S1, S2, RRR Soft, NT, BS+ No edema b/l  Disposition and Follow-up:  Follow up with Dr. Gerilyn Pilgrim in 2 weeks Follow up with PMD in 1-2 weeks Will need outpatient referral to GI  Consults:  Neurology, Dr. Gerilyn Pilgrim   Significant Diagnostic Studies:  US Carotid Duplex Bilateral  05/15/2011  *RADIOLOGY REPORT*  Clinical Data: TIA, hypertension and diabetes.  BILATERAL CAROTID DUPLEX ULTRASOUND  Technique: Wallace Cullens scale imaging, color Doppler and duplex ultrasound was performed of bilateral carotid and vertebral arteries in the neck.  Comparison:  None.  Criteria:  Quantification of carotid stenosis is based on velocity parameters that correlate the residual internal carotid diameter with NASCET-based stenosis levels, using the diameter of the distal internal carotid lumen as the denominator for stenosis measurement.  The following velocity measurements were obtained:                   PEAK SYSTOLIC/END DIASTOLIC RIGHT ICA:                        103/16cm/sec CCA:                        26/20cm/sec SYSTOLIC ICA/CCA RATIO:     0.7 DIASTOLIC ICA/CCA RATIO:    1.4 ECA:                        115cm/sec  LEFT ICA:                        149/25cm/sec CCA:                        120/13cm/sec SYSTOLIC ICA/CCA RATIO:     1.2 DIASTOLIC ICA/CCA RATIO:    1.9 ECA:                        120cm/sec  Findings:  RIGHT CAROTID ARTERY: Relatively mild amount of  calcified plaque is present at the level of the carotid bulb and proximal ICA. Estimated ICA stenosis is less than 50%.  Internal carotid artery shows tortuosity in the neck.  RIGHT VERTEBRAL ARTERY:  Antegrade flow with normal wave form.  LEFT CAROTID ARTERY: Mild amount of calcified plaque is present at the level of the carotid bulb and ICA.  Proximal ICA velocities are not elevated.  There is mild elevation of distal ICA velocities in the upper neck at the level of tortuosity.  Estimated ICA stenosis is less than 50%.  LEFT VERTEBRAL ARTERY:  Antegrade flow with normal wave form.  IMPRESSION: Mild amount of calcified atherosclerotic plaque at both carotid bifurcations.  Estimated bilateral ICA stenoses are less than 50% by ultrasound.  Original Report Authenticated By: Reola Calkins, M.D.    Brief H and P: For complete details please refer to admission H and P, but in brief This 78 year old lady woke up in the early as of this morning and then found she was unsteady with her gait. She had fallen with this type of dizziness before when she was found to have a 2-1 heart block and before her pacemaker was inserted. However her pacemaker is working just fine. She denies any loss of consciousness, chest pain or palpitations. When she was seen in the emergency room, she did have a urine which looked like she had a UTI. She denies any difficulties.     Hospital Course:  This 78 year old lady was admitted to the hospital with complaints of dizziness, unsteady gait, nausea. During her evaluation in the hospital she was found to have an Enterobacter urinary tract infection. She was treated  appropriately with antibiotics. CT scan of the head was unremarkable. She was seen in consultation by neurology who did not feel that an MRI was necessary. She did have carotid Dopplers done which showed less than 50% stenosis bilaterally. An outpatient EEG was also recommended. She'll follow up with neurology in 2 weeks  time. Patient was complaining of some nausea and vomiting and feels that the food is regurgitating. She was started on a proton pump inhibitor and reports improvement. She will need outpatient evaluation with gastroenterology. This can pursued by the primary care physician. She has been working with physical therapy and has done quite well. No outpatient physical therapy was recommended. She feels significantly better, has not had any further dizziness. She appears to be back to her baseline.  She was noted to be somewhat hypoglycemic with blood sugars trending down to into the 60s. Her glipizide/metformin has been discontinued. This can be restarted as felt appropriate by the primary care physician. She's been recommended to check her blood sugars closely at home.  Patient was noted to have elevated blood pressures. She is on Coreg twice a day as well as losartan. We've added Norvasc to her antihypertensive regimen. This can be further adjusted by primary care doctor.  Since the patient feels back to her baseline, she'll be discharged home today in care of her daughter.  Time spent on Discharge:  Signed: Tailor Westfall [facility] Pager: [phone] 05/15/2011, 4:16 PM

## 2011-05-19 NOTE — Progress Notes (Signed)
Discharge summary sent to payer through [facility]  

## 2011-10-11 ENCOUNTER — Encounter (HOSPITAL_COMMUNITY): Payer: Self-pay

## 2011-10-11 ENCOUNTER — Emergency Department (HOSPITAL_COMMUNITY): Payer: Medicare Other

## 2011-10-11 ENCOUNTER — Inpatient Hospital Stay (HOSPITAL_COMMUNITY)
Admission: EM | Admit: 2011-10-11 | Discharge: 2011-10-17 | DRG: 243 | Disposition: A | Discharge status: Home / Self Care | Payer: Medicare Other | Attending: Cardiovascular Disease | Admitting: Cardiovascular Disease

## 2011-10-11 DIAGNOSIS — I5021 Acute systolic (congestive) heart failure: Principal | ICD-10-CM | POA: Diagnosis present

## 2011-10-11 DIAGNOSIS — E876 Hypokalemia: Secondary | ICD-10-CM | POA: Diagnosis not present

## 2011-10-11 DIAGNOSIS — R600 Localized edema: Secondary | ICD-10-CM | POA: Diagnosis present

## 2011-10-11 DIAGNOSIS — N289 Disorder of kidney and ureter, unspecified: Secondary | ICD-10-CM | POA: Diagnosis present

## 2011-10-11 DIAGNOSIS — I200 Unstable angina: Secondary | ICD-10-CM | POA: Diagnosis present

## 2011-10-11 DIAGNOSIS — Z7982 Long term (current) use of aspirin: Secondary | ICD-10-CM

## 2011-10-11 DIAGNOSIS — G4700 Insomnia, unspecified: Secondary | ICD-10-CM | POA: Diagnosis not present

## 2011-10-11 DIAGNOSIS — I429 Cardiomyopathy, unspecified: Secondary | ICD-10-CM | POA: Diagnosis present

## 2011-10-11 DIAGNOSIS — Z96659 Presence of unspecified artificial knee joint: Secondary | ICD-10-CM

## 2011-10-11 DIAGNOSIS — E7800 Pure hypercholesterolemia, unspecified: Secondary | ICD-10-CM | POA: Diagnosis present

## 2011-10-11 DIAGNOSIS — I2510 Atherosclerotic heart disease of native coronary artery without angina pectoris: Secondary | ICD-10-CM | POA: Diagnosis present

## 2011-10-11 DIAGNOSIS — I10 Essential (primary) hypertension: Secondary | ICD-10-CM | POA: Diagnosis present

## 2011-10-11 DIAGNOSIS — K219 Gastro-esophageal reflux disease without esophagitis: Secondary | ICD-10-CM | POA: Diagnosis present

## 2011-10-11 DIAGNOSIS — I4891 Unspecified atrial fibrillation: Secondary | ICD-10-CM | POA: Diagnosis present

## 2011-10-11 DIAGNOSIS — I420 Dilated cardiomyopathy: Secondary | ICD-10-CM | POA: Diagnosis present

## 2011-10-11 DIAGNOSIS — R0602 Shortness of breath: Secondary | ICD-10-CM | POA: Diagnosis present

## 2011-10-11 DIAGNOSIS — D649 Anemia, unspecified: Secondary | ICD-10-CM | POA: Diagnosis present

## 2011-10-11 DIAGNOSIS — I441 Atrioventricular block, second degree: Secondary | ICD-10-CM

## 2011-10-11 DIAGNOSIS — I509 Heart failure, unspecified: Secondary | ICD-10-CM | POA: Diagnosis present

## 2011-10-11 DIAGNOSIS — Z95810 Presence of automatic (implantable) cardiac defibrillator: Secondary | ICD-10-CM

## 2011-10-11 DIAGNOSIS — R0600 Dyspnea, unspecified: Secondary | ICD-10-CM

## 2011-10-11 DIAGNOSIS — Z950 Presence of cardiac pacemaker: Secondary | ICD-10-CM | POA: Insufficient documentation

## 2011-10-11 DIAGNOSIS — Z79899 Other long term (current) drug therapy: Secondary | ICD-10-CM

## 2011-10-11 DIAGNOSIS — E119 Type 2 diabetes mellitus without complications: Secondary | ICD-10-CM | POA: Diagnosis present

## 2011-10-11 DIAGNOSIS — I480 Paroxysmal atrial fibrillation: Secondary | ICD-10-CM

## 2011-10-11 HISTORY — DX: Atrioventricular block, second degree: I44.1

## 2011-10-11 LAB — HEMOGLOBIN A1C
Hgb A1c MFr Bld: 7 % — ABNORMAL HIGH (ref <5.7)
Mean Plasma Glucose: 154 mg/dL — ABNORMAL HIGH (ref <117)

## 2011-10-11 LAB — PROTIME-INR
INR: 1.19 (ref 0.00–1.49)
Prothrombin Time: 14.9 seconds (ref 11.6–15.2)

## 2011-10-11 LAB — HEPATIC FUNCTION PANEL
Alkaline Phosphatase: 75 U/L (ref 39–117)
Bilirubin, Direct: 0.2 mg/dL (ref 0.0–0.3)
Total Bilirubin: 0.9 mg/dL (ref 0.3–1.2)

## 2011-10-11 LAB — TSH: TSH: 2.311 u[IU]/mL (ref 0.350–4.500)

## 2011-10-11 LAB — URINALYSIS, ROUTINE W REFLEX MICROSCOPIC
Bilirubin Urine: NEGATIVE
Ketones, ur: NEGATIVE mg/dL
Leukocytes, UA: NEGATIVE
Nitrite: NEGATIVE
Protein, ur: NEGATIVE mg/dL
pH: 7 (ref 5.0–8.0)

## 2011-10-11 LAB — BASIC METABOLIC PANEL
BUN: 10 mg/dL (ref 6–23)
CO2: 27 mEq/L (ref 19–32)
Chloride: 96 mEq/L (ref 96–112)
GFR calc Af Amer: 46 mL/min — ABNORMAL LOW (ref >90)
Potassium: 3.8 mEq/L (ref 3.5–5.1)

## 2011-10-11 LAB — T4, FREE: Free T4: 1.63 ng/dL (ref 0.80–1.80)

## 2011-10-11 LAB — PRO B NATRIURETIC PEPTIDE: Pro B Natriuretic peptide (BNP): 2492 pg/mL — ABNORMAL HIGH (ref 0–450)

## 2011-10-11 LAB — CBC
HCT: 34.3 % — ABNORMAL LOW (ref 36.0–46.0)
MCV: 98.6 fL (ref 78.0–100.0)
RBC: 3.48 MIL/uL — ABNORMAL LOW (ref 3.87–5.11)
RDW: 13.7 % (ref 11.5–15.5)
WBC: 7.8 10*3/uL (ref 4.0–10.5)

## 2011-10-11 LAB — APTT: aPTT: 34 seconds (ref 24–37)

## 2011-10-11 LAB — GLUCOSE, CAPILLARY

## 2011-10-11 LAB — URINE MICROSCOPIC-ADD ON

## 2011-10-11 LAB — TROPONIN I: Troponin I: <0.3 ng/mL (ref <0.30)

## 2011-10-11 MED ORDER — FUROSEMIDE 10 MG/ML IJ SOLN
60.0000 mg | Freq: Two times a day (BID) | INTRAMUSCULAR | Status: DC
  Administered 2011-10-11 – 2011-10-12 (×2): 60 mg via INTRAVENOUS
  Filled 2011-10-11 (×2): qty 6

## 2011-10-11 MED ORDER — INSULIN ASPART 100 UNIT/ML ~~LOC~~ SOLN
0.0000 [IU] | Freq: Every day | SUBCUTANEOUS | Status: DC
  Administered 2011-10-14 – 2011-10-15 (×2): 2 [IU] via SUBCUTANEOUS

## 2011-10-11 MED ORDER — BIOTENE DRY MOUTH MT LIQD: 15.0000 mL | OROMUCOSAL | Status: DC | PRN

## 2011-10-11 MED ORDER — ENOXAPARIN SODIUM 40 MG/0.4ML ~~LOC~~ SOLN
40.0000 mg | SUBCUTANEOUS | Status: DC
  Administered 2011-10-11 – 2011-10-12 (×2): 40 mg via SUBCUTANEOUS
  Filled 2011-10-11 (×2): qty 0.4

## 2011-10-11 MED ORDER — INSULIN ASPART 100 UNIT/ML ~~LOC~~ SOLN
0.0000 [IU] | Freq: Three times a day (TID) | SUBCUTANEOUS | Status: DC
  Administered 2011-10-12 – 2011-10-13 (×2): 2 [IU] via SUBCUTANEOUS
  Administered 2011-10-13 – 2011-10-17 (×4): 1 [IU] via SUBCUTANEOUS

## 2011-10-11 MED ORDER — FAMOTIDINE 20 MG PO TABS
20.0000 mg | ORAL_TABLET | Freq: Every day | ORAL | Status: DC
Start: 2011-10-11 — End: 2011-10-17
  Administered 2011-10-11 – 2011-10-17 (×7): 20 mg via ORAL
  Filled 2011-10-11 (×7): qty 1

## 2011-10-11 MED ORDER — SIMVASTATIN 20 MG PO TABS
ORAL_TABLET | ORAL | Status: AC
  Filled 2011-10-11: qty 2

## 2011-10-11 MED ORDER — CARVEDILOL 12.5 MG PO TABS
12.5000 mg | ORAL_TABLET | Freq: Two times a day (BID) | ORAL | Status: DC
  Administered 2011-10-11 – 2011-10-17 (×11): 12.5 mg via ORAL
  Filled 2011-10-11 (×15): qty 1

## 2011-10-11 MED ORDER — LEVALBUTEROL HCL 0.63 MG/3ML IN NEBU
0.6300 mg | INHALATION_SOLUTION | Freq: Four times a day (QID) | RESPIRATORY_TRACT | Status: DC
  Administered 2011-10-11 – 2011-10-13 (×8): 0.63 mg via RESPIRATORY_TRACT
  Filled 2011-10-11 (×9): qty 3

## 2011-10-11 MED ORDER — POTASSIUM CHLORIDE CRYS ER 20 MEQ PO TBCR
EXTENDED_RELEASE_TABLET | ORAL | Status: AC
  Administered 2011-10-11: 20 meq via ORAL
  Filled 2011-10-11: qty 1

## 2011-10-11 MED ORDER — OXYCODONE HCL 5 MG PO TABS: 5.0000 mg | ORAL_TABLET | ORAL | Status: DC | PRN

## 2011-10-11 MED ORDER — ONDANSETRON HCL 4 MG PO TABS
4.0000 mg | ORAL_TABLET | Freq: Four times a day (QID) | ORAL | Status: DC | PRN
  Administered 2011-10-11 – 2011-10-12 (×2): 4 mg via ORAL
  Filled 2011-10-11 (×2): qty 1

## 2011-10-11 MED ORDER — POTASSIUM CHLORIDE CRYS ER 20 MEQ PO TBCR
20.0000 meq | EXTENDED_RELEASE_TABLET | Freq: Two times a day (BID) | ORAL | Status: DC
  Administered 2011-10-11 (×2): 20 meq via ORAL
  Filled 2011-10-11: qty 1

## 2011-10-11 MED ORDER — NITROGLYCERIN 2 % TD OINT
1.0000 [in_us] | TOPICAL_OINTMENT | Freq: Three times a day (TID) | TRANSDERMAL | Status: DC
  Administered 2011-10-11 – 2011-10-12 (×2): 1 [in_us] via TOPICAL
  Filled 2011-10-11 (×2): qty 1

## 2011-10-11 MED ORDER — ACETAMINOPHEN 325 MG PO TABS: 650.0000 mg | ORAL_TABLET | Freq: Four times a day (QID) | ORAL | Status: DC | PRN

## 2011-10-11 MED ORDER — ASPIRIN EC 325 MG PO TBEC
325.0000 mg | DELAYED_RELEASE_TABLET | Freq: Every day | ORAL | Status: DC
  Administered 2011-10-11 – 2011-10-17 (×7): 325 mg via ORAL
  Filled 2011-10-11 (×9): qty 1

## 2011-10-11 MED ORDER — BIOTENE DRY MOUTH MT LIQD
15.0000 mL | Freq: Two times a day (BID) | OROMUCOSAL | Status: DC
  Administered 2011-10-11 – 2011-10-17 (×11): 15 mL via OROMUCOSAL

## 2011-10-11 MED ORDER — ALUM & MAG HYDROXIDE-SIMETH 200-200-20 MG/5ML PO SUSP: 30.0000 mL | Freq: Four times a day (QID) | ORAL | Status: DC | PRN

## 2011-10-11 MED ORDER — DOCUSATE SODIUM 100 MG PO CAPS
100.0000 mg | ORAL_CAPSULE | Freq: Two times a day (BID) | ORAL | Status: DC
  Administered 2011-10-11 – 2011-10-17 (×10): 100 mg via ORAL
  Filled 2011-10-11 (×13): qty 1

## 2011-10-11 MED ORDER — ONDANSETRON HCL 4 MG/2ML IJ SOLN: 4.0000 mg | Freq: Four times a day (QID) | INTRAMUSCULAR | Status: DC | PRN

## 2011-10-11 MED ORDER — NITROGLYCERIN 2 % TD OINT
1.0000 [in_us] | TOPICAL_OINTMENT | Freq: Once | TRANSDERMAL | Status: AC
  Administered 2011-10-11: 1 [in_us] via TOPICAL
  Filled 2011-10-11: qty 1

## 2011-10-11 MED ORDER — SIMVASTATIN 40 MG PO TABS
40.0000 mg | ORAL_TABLET | Freq: Every day | ORAL | Status: DC
  Administered 2011-10-11 – 2011-10-16 (×6): 40 mg via ORAL
  Filled 2011-10-11: qty 2
  Filled 2011-10-11 (×4): qty 1
  Filled 2011-10-11: qty 2

## 2011-10-11 MED ORDER — INSULIN GLARGINE 100 UNIT/ML ~~LOC~~ SOLN
5.0000 [IU] | Freq: Every day | SUBCUTANEOUS | Status: DC
  Administered 2011-10-11 – 2011-10-16 (×6): 5 [IU] via SUBCUTANEOUS

## 2011-10-11 MED ORDER — LOSARTAN POTASSIUM 50 MG PO TABS
100.0000 mg | ORAL_TABLET | Freq: Every day | ORAL | Status: DC
  Administered 2011-10-11 – 2011-10-13 (×3): 100 mg via ORAL
  Filled 2011-10-11: qty 2
  Filled 2011-10-11 (×3): qty 1
  Filled 2011-10-11: qty 2

## 2011-10-11 MED ORDER — ACETAMINOPHEN 650 MG RE SUPP: 650.0000 mg | Freq: Four times a day (QID) | RECTAL | Status: DC | PRN

## 2011-10-11 MED ORDER — NITROFURANTOIN MACROCRYSTAL 50 MG PO CAPS
50.0000 mg | ORAL_CAPSULE | Freq: Every day | ORAL | Status: DC
  Administered 2011-10-11 – 2011-10-17 (×7): 50 mg via ORAL
  Filled 2011-10-11 (×8): qty 1

## 2011-10-11 MED ORDER — FUROSEMIDE 10 MG/ML IJ SOLN
60.0000 mg | INTRAMUSCULAR | Status: AC
  Administered 2011-10-11: 60 mg via INTRAVENOUS
  Filled 2011-10-11: qty 6

## 2011-10-11 MED ORDER — SIMVASTATIN 40 MG PO TABS
40.0000 mg | ORAL_TABLET | Freq: Every day | ORAL | Status: DC
  Filled 2011-10-11 (×2): qty 1

## 2011-10-11 NOTE — ED Notes (Signed)
Family at bedside. 

## 2011-10-11 NOTE — ED Provider Notes (Signed)
History   This chart was scribed for Tobin Chad, MD, by Frederik Pear. The patient was seen in room APA16A/APA16A and the patient's care was started at 1013.    CSN: [id number]  Arrival date & time 10/11/11  4098   First MD Initiated Contact with Patient 10/11/11 1013      Chief Complaint  Patient presents with  . Chest Pain  . Shortness of Breath    (Consider location/radiation/quality/duration/timing/severity/associated sxs/prior treatment) HPI Comments: Kathleen [name] is a 78 y.o. female who presents to the Emergency Department complaining of moderate, consistent SOB that began when the pt awoke this morning. Pt reports that she also awoke yesterday morning with SOB, but the symptom subsided as the day progressed. Pt states that she feels tired when breathing. Pt reports associated chest pain and bilateral swelling in her feet and ankles that has been present for the past several months. Pt is prescribed a fluid pill, which she has taken for the past three days, but not previously because she confused it with another medication. Pt is currently on a pacemaker, and the pt's daughter states that her mother is close to 100% dependent on the pacemaker. Pt reports a h/o of heart problems and is taking BP medication. Pt denies ever having been put on a breathing machine before and denies O2 use at home. Pt's last hospitalization was in July for a kidney infection.           Past Medical History  Diagnosis Date  . Hypertension   . Diabetes mellitus   . High cholesterol   . Atrial fibrillation   . Paroxysmal a-fib 12/13/2010  . PNA (pneumonia)   . CAD (coronary artery disease) 2006  . Acid reflux   . CHF (congestive heart failure)     Past Surgical History  Procedure Date  . Replacement total knee   . Bladder tact   . Pacemaker insertion   . Cholecystectomy     No family history on file.  History  Substance Use Topics  . Smoking status: Never Smoker   .  Smokeless tobacco: Not on file  . Alcohol Use: No    OB History    Grav Para Term Preterm Abortions TAB SAB Ect Mult Living                  Review of Systems  Constitutional: Positive for activity change and fatigue. Negative for fever, chills and appetite change.  HENT: Negative.   Eyes: Negative.   Respiratory: Positive for shortness of breath. Negative for cough, choking, chest tightness and wheezing.   Cardiovascular: Positive for chest pain and leg swelling. Negative for palpitations.  Gastrointestinal: Negative for nausea, vomiting, abdominal pain and diarrhea.  Genitourinary: Negative.   Musculoskeletal: Positive for joint swelling. Negative for arthralgias.  Skin: Negative.   Neurological: Positive for weakness. Negative for dizziness, tremors, light-headedness and headaches.  Psychiatric/Behavioral: Negative.   All other systems reviewed and are negative.    Allergies  Coumadin; Meloxicam; Morphine; and Sulfonamide derivatives  Home Medications   Current Outpatient Rx  Name Route Sig Dispense Refill  . ACETAMINOPHEN 325 MG PO TABS Oral Take 1-2 tablets (325-650 mg total) by mouth every 4 (four) hours as needed.    . ALENDRONATE SODIUM 35 MG PO TABS Oral Take 35 mg by mouth every 7 (seven) days. Take with a full glass of water on an empty stomach.patient takes on Sunday    . AMIODARONE HCL 200 MG PO  TABS Oral Take 200 mg by mouth daily.     Marland Kitchen AMLODIPINE BESYLATE 5 MG PO TABS Oral Take 1 tablet (5 mg total) by mouth daily. 30 tablet 1  . ASPIRIN EC 325 MG PO TBEC Oral Take 325 mg by mouth daily.    Marland Kitchen CARVEDILOL 25 MG PO TABS Oral Take 25 mg by mouth 2 (two) times daily.    Marland Kitchen LOSARTAN POTASSIUM 50 MG PO TABS Oral Take 100 mg by mouth at bedtime.    Marland Kitchen METFORMIN HCL ER PO Oral Take 500 mg by mouth daily with supper.    . CENTRUM SILVER PO Oral Take 1 tablet by mouth daily.    Marland Kitchen SIMVASTATIN 40 MG PO TABS Oral Take 40 mg by mouth at bedtime.      . TRAMADOL HCL 50 MG PO  TABS Oral Take 50-100 mg by mouth every 8 (eight) hours as needed. Leg pain      BP 178/79  Pulse 75  Temp 98.2 F (36.8 C) (Oral)  Resp 20  SpO2 91%  Physical Exam  Nursing note and vitals reviewed. Constitutional: She is oriented to person, place, and time. She appears well-developed and well-nourished. No distress. She is not intubated.  HENT:  Head: Normocephalic and atraumatic.  Right Ear: External ear normal.  Left Ear: External ear normal.  Mouth/Throat: Oropharynx is clear and moist. No oropharyngeal exudate.  Eyes: Conjunctivae normal and EOM are normal. Pupils are equal, round, and reactive to light. Right eye exhibits no discharge. Left eye exhibits no discharge.  Neck: Trachea normal, normal range of motion and phonation normal. Neck supple. JVD present. Normal carotid pulses present. No muscular tenderness present. Carotid bruit is not present. No tracheal deviation present. No thyromegaly present.  Cardiovascular: Normal rate, regular rhythm, normal heart sounds, intact distal pulses and normal pulses.   No extrasystoles are present. PMI is not displaced.  Exam reveals no gallop and no decreased pulses.   No murmur heard. Pulmonary/Chest: Accessory muscle usage present. No stridor. Tachypnea noted. No apnea and not bradypneic. She is not intubated. No respiratory distress. She has no decreased breath sounds. She has no wheezes. She has rhonchi. She has rales. She exhibits no tenderness, no bony tenderness, no laceration, no deformity and no retraction.  Abdominal: Soft. Bowel sounds are normal. She exhibits no distension, no ascites, no pulsatile midline mass and no mass. There is no hepatosplenomegaly. There is no tenderness. There is no rebound, no guarding and no CVA tenderness.  Musculoskeletal: Normal range of motion. She exhibits edema. She exhibits no tenderness.  Lymphadenopathy:    She has no cervical adenopathy.  Neurological: She is alert and oriented to person,  place, and time.  Skin: Skin is warm and dry. No rash noted. She is not diaphoretic. No erythema. No pallor.  Psychiatric: She has a normal mood and affect. Her behavior is normal.    ED Course  Procedures (including critical care time)  DIAGNOSTIC STUDIES: Oxygen Saturation is 91% on roomair, adequate by my interpretation.    COORDINATION OF CARE:  10:40- Discussed planned course of treatment with the patient, including O2, who is agreeable at this time.  11:00- Medication Orders: Nitroglycerin (Nitroglyn) 2% ointment 1 inch- Once; Furosemide (LASIX) injection 60 mg- STAT.  12:25- Recheck- Pt will be admitted to telemetry.   Results for orders placed during the hospital encounter of 10/11/11  CBC      Component Value Range   WBC 7.8  4.0 - 10.5  K/uL   RBC 3.48 (*) 3.87 - 5.11 MIL/uL   Hemoglobin 11.6 (*) 12.0 - 15.0 g/dL   HCT 11.9 (*) 14.7 - 82.9 %   MCV 98.6  78.0 - 100.0 fL   MCH 33.3  26.0 - 34.0 pg   MCHC 33.8  30.0 - 36.0 g/dL   RDW 56.2  13.0 - 86.5 %   Platelets 278  150 - 400 K/uL  BASIC METABOLIC PANEL      Component Value Range   Sodium 134 (*) 135 - 145 mEq/L   Potassium 3.8  3.5 - 5.1 mEq/L   Chloride 96  96 - 112 mEq/L   CO2 27  19 - 32 mEq/L   Glucose, Bld 161 (*) 70 - 99 mg/dL   BUN 10  6 - 23 mg/dL   Creatinine, Ser 7.84 (*) 0.50 - 1.10 mg/dL   Calcium 9.5  8.4 - 69.6 mg/dL   GFR calc non Af Amer 40 (*) >90 mL/min   GFR calc Af Amer 46 (*) >90 mL/min  PRO B NATRIURETIC PEPTIDE      Component Value Range   Pro B Natriuretic peptide (BNP) 2492.0 (*) 0 - 450 pg/mL  TROPONIN I      Component Value Range   Troponin I <0.30  <0.30 ng/mL      Labs Reviewed  CBC  BASIC METABOLIC PANEL  PRO B NATRIURETIC PEPTIDE  TROPONIN I   Dg Chest Port 1 View  10/11/2011  *RADIOLOGY REPORT*  Clinical Data: Chest pain, shortness of breath, CHF  PORTABLE CHEST - 1 VIEW  Comparison: 03/25/2011  Findings: Chronic interstitial markings/emphysematous changes.   Patchy/interstitial opacities in the left upper lobe, right perihilar region, and right lower lobe, favored to reflect superimposed interstitial edema, less likely multifocal infection.  No definite pleural effusion.  No pneumothorax.  The heart is normal in size of subclavian pacemaker.  IMPRESSION: Multifocal patchy/interstitial opacities, favored to reflect interstitial edema, likely multifocal infection.  Underlying chronic interstitial markings/emphysematous changes.   Original Report Authenticated By: Charline Bills, M.D.      No diagnosis found.   Date: 10/11/2011  Rate: 75 bpm  Rhythm: paced rhythm  QRS Axis: left  Intervals:    ST/T Wave abnormalities: nonspecific ST changes  Conduction Disutrbances:   Narrative Interpretation: wide complex, atrial-sensed, ventricular pacing  Old EKG Reviewed: no significant      MDM  Pt presents for evaluation of shortness of breath.  Room air O2 sat is depressed.  Improves with O2 via [place].  Pt has increase work of breathing but is not distressed.  Plan basic labs, BNP, CXR, reassess.  Exam is concerning for acute CHF.  Pt has patchy airspace opacities concerning for pulmonary edema.  Administered lasix, O2, and nitropaste.  Secondary to depressed O2 sat on RA, contacted Dr. Sherrie Mustache (hospitalist).  Plan admit fopr further mgmnt of acute CHF.  I personally performed the services described in this documentation, which was scribed in my presence. The recorded information has been reviewed and considered.         Tobin Chad, MD 10/11/11 1250

## 2011-10-11 NOTE — ED Notes (Signed)
Report called to Abby, RN on 300.

## 2011-10-11 NOTE — ED Notes (Signed)
Pt reports waking this a.m with with sob, and central chest pain.  Pt denies any n/v, diaphoresis, dizziness.  Pt reports "i feel like i can't get a good breath".

## 2011-10-11 NOTE — ED Notes (Signed)
Attempted to call report to 300.  RN at lunch.

## 2011-10-11 NOTE — ED Notes (Signed)
Attempted to call report to 300. RN still at lunch

## 2011-10-11 NOTE — H&P (Signed)
[facility] History and Physical  Kathleen [name] ZHY:[id number] DOB: May 29, 1924 DOA: 10/11/2011  Referring physician: Dr. Lorenso Courier. PCP: Kirk Ruths, MD  Cardiologist: Dr. Royann Shivers  Chief Complaint: Shortness of breath.  HPI: Kathleen [name] is a 78 y.o. female significant for paroxysmal atrial fibrillation, coronary artery disease, and status post pacemaker, who presents to the emergency department with a chief complaint of shortness of breath. She has had shortness of breath for several months, however, over the past few mornings, she has had more shortness of breath upon arising in the morning. After she gets up and moves about, the shortness of breath subsides. She has also had shortness of breath when she lays flat. Sometimes she has to sit up in order to catch her breath. She denies associated cough or fever. She has had chest "heaviness" in the center of her chest, with no associated radiation, diaphoresis, or nausea. She has also had progressive swelling in her legs. A couple weeks ago, her primary cardiologist started hydrochlorothiazide for treatment.  In the emergency department, she is afebrile and hypertensive with a blood pressure 172/92. She is oxygenating 96% on 3 L of nasal cannula oxygen. Her lab data are significant for a pro BNP of 2492, troponin I of less than 0.30, glucose of 161, sodium 134, creatinine of 1.21, and hemoglobin of 11.6. Her chest x-ray reveals multifocal patchy/interstitial opacities favoring interstitial edema and underlying chronic interstitial markings/emphysematous changes. Her EKG reveals an atrial paced rhythm of 75 beats per minute. She is being admitted for further evaluation and management.  Review of Systems: As above in the history of present illness. Otherwise negative.  Past Medical History  Diagnosis Date  . Hypertension   . Diabetes mellitus   . High cholesterol   . Atrial fibrillation   . Paroxysmal a-fib 12/13/2010  . PNA  (pneumonia)   . CAD (coronary artery disease) 2006  . Acid reflux   . AV block, 2nd degree     S/p Pacemaker 03/2011   Past Surgical History  Procedure Date  . Replacement total knee   . Bladder tact   . Pacemaker insertion   . Cholecystectomy    Social History: She is widowed. She lives in [place]. Her daughter Mrs. Edwards lives with her. She generally ambulate unassisted. She still drives occasionally. She denies tobacco, alcohol, or illicit drug use.    Allergies  Allergen Reactions  . Coumadin (Warfarin Sodium) Other (See Comments)    Caused Patient to Bleed Out.   . Meloxicam Other (See Comments)    Unknown  . Morphine Itching    Not in right state of mind.   . Sulfonamide Derivatives Hives    Family history: Her mother committed suicide. She is not sure what her father died of.  Prior to Admission medications   Medication Sig Start Date End Date Taking? Authorizing Provider  acetaminophen (TYLENOL) 325 MG tablet Take 1-2 tablets (325-650 mg total) by mouth every 4 (four) hours as needed. 03/26/11 03/25/12 Yes Wilburt Finlay, PA  albuterol (PROVENTIL) (2.5 MG/3ML) 0.083% nebulizer solution Take 2.5 mg by nebulization every 6 (six) hours as needed. Congestion   Yes Historical Provider, MD  alendronate (FOSAMAX) 35 MG tablet Take 35 mg by mouth every 7 (seven) days. Take with a full glass of water on an empty stomach.patient takes on Sunday   Yes Historical Provider, MD  amLODipine (NORVASC) 5 MG tablet Take 1 tablet (5 mg total) by mouth daily. 05/15/11 05/14/12 Yes Erick Blinks, MD  aspirin EC 325 MG tablet Take 325 mg by mouth daily.   Yes Historical Provider, MD  carvedilol (COREG) 12.5 MG tablet Take 12.5 mg by mouth 2 (two) times daily with a meal.   Yes Historical Provider, MD  hydrochlorothiazide (MICROZIDE) 12.5 MG capsule Take 12.5 mg by mouth daily.   Yes Historical Provider, MD  losartan (COZAAR) 50 MG tablet Take 100 mg by mouth at bedtime.   Yes Historical Provider,  MD  metFORMIN (GLUCOPHAGE) 500 MG tablet Take 500 mg by mouth 2 (two) times daily with a meal.   Yes Historical Provider, MD  Multiple Vitamins-Minerals (CENTRUM SILVER PO) Take 1 tablet by mouth daily.   Yes Historical Provider, MD  nitrofurantoin (MACRODANTIN) 50 MG capsule Take 50 mg by mouth daily.   Yes Historical Provider, MD  simvastatin (ZOCOR) 40 MG tablet Take 40 mg by mouth at bedtime.     Yes Historical Provider, MD   Physical Exam: Filed Vitals:   10/11/11 0950 10/11/11 1114 10/11/11 1145  BP: 178/79 132/116 172/92  Pulse: 75 83 80  Temp: 98.2 F (36.8 C)    TempSrc: Oral    Resp: 20    SpO2: 91% 95% 96%     General:  Pleasant 78 year old Caucasian woman sitting up in bed, in no acute distress. (Following Lasix with diuresis of 1 L of urine).  Eyes: Pupils are equal, round, and reactive to light. Extraocular movements are intact. Conjunctivae are clear. Sclerae are white.  ENT: Oropharynx reveals moist mucous membranes. No posterior exudates or erythema.  Neck: Supple, no adenopathy, no thyromegaly. Cannot appreciate JVD.  Cardiovascular: S1, S2, with no murmurs rubs or gallops. Palpable pacemaker at the left upper chest wall. Nontender.  Respiratory: Breathing is mildly labored. Faint crackles and wheezes throughout lung fields.  Abdomen: Positive bowel sounds, soft, nontender, nondistended.  Skin: Good turgor. No rashes.  Musculoskeletal: Pedal pulses palpable. No acute hot red joints. 1+ bilateral lower extremity pedal/pretibial edema.  Psychiatric: Alert and oriented x3. Speech is clear. Pleasant affect.  Neurologic: Cranial nerves II through XII are intact. Strength is 5 over 5 throughout. Sensation is intact.  Labs on Admission:  Basic Metabolic Panel:  Lab 10/11/11 0454  NA 134*  K 3.8  CL 96  CO2 27  GLUCOSE 161*  BUN 10  CREATININE 1.21*  CALCIUM 9.5  MG --  PHOS --   Liver Function Tests: No results found for this basename:  AST:5,ALT:5,ALKPHOS:5,BILITOT:5,PROT:5,ALBUMIN:5 in the last 168 hours No results found for this basename: LIPASE:5,AMYLASE:5 in the last 168 hours No results found for this basename: AMMONIA:5 in the last 168 hours CBC:  Lab 10/11/11 0955  WBC 7.8  NEUTROABS --  HGB 11.6*  HCT 34.3*  MCV 98.6  PLT 278   Cardiac Enzymes:  Lab 10/11/11 0955  CKTOTAL --  CKMB --  CKMBINDEX --  TROPONINI <0.30    BNP (last 3 results)  Basename 10/11/11 0955  PROBNP 2492.0*   CBG: No results found for this basename: GLUCAP:5 in the last 168 hours  Radiological Exams on Admission: Dg Chest Port 1 View  10/11/2011  *RADIOLOGY REPORT*  Clinical Data: Chest pain, shortness of breath, CHF  PORTABLE CHEST - 1 VIEW  Comparison: 03/25/2011  Findings: Chronic interstitial markings/emphysematous changes.  Patchy/interstitial opacities in the left upper lobe, right perihilar region, and right lower lobe, favored to reflect superimposed interstitial edema, less likely multifocal infection.  No definite pleural effusion.  No pneumothorax.  The heart is normal  in size of subclavian pacemaker.  IMPRESSION: Multifocal patchy/interstitial opacities, favored to reflect interstitial edema, likely multifocal infection.  Underlying chronic interstitial markings/emphysematous changes.   Original Report Authenticated By: Charline Bills, M.D.     EKG: Atrial paced rhythm with a heart rate of 75 beats per minute.  Assessment/Plan Principal Problem:  *CHF exacerbation Active Problems:  SOB (shortness of breath)  Chest heaviness  Hypertension  DM type 2 (diabetes mellitus, type 2)  CAD, moderate by cath 2006  Bilateral lower extremity edema  Acute renal insufficiency  Anemia   1. This is a pleasant 78 year old woman with a history of coronary artery disease, heart block resulting in pacemaker insertion in March 2013, paroxysmal atrial fibrillation (she has refused anticoagulation in the past), diabetes  mellitus, and hypertension, who presents with signs and symptoms consistent with congestive heart failure exacerbation. There has been no recorded history of congestive heart failure. Her ejection fraction is unknown. Therefore, diastolic or systolic heart failure cannot be clearly confirmed yet. She does not have pneumonia symptomatology. Her chest heaviness is likely the consequence of congestive heart failure. Her initial troponin I is negative. She is moderately hypertensive. She has acute renal insufficiency which may be secondary to congestive heart failure. Her renal function was within normal limits in April of 2013.   Plan: 1. The patient received 60 mg of IV Lasix in the emergency department. She has since diuresed 1 L of urine. She was also given 1 inch of nitroglycerin ointment. 2. We'll continue Lasix at 60 mg every 12 hours. We'll place a Foley catheter for strict ins and outs and to decrease symptomatic dyspnea. 3. We'll continue nitroglycerin ointment. 4. We'll continue carvedilol and losartan. We'll hold amlodipine because of the edema. We'll discontinue hydrochlorothiazide in the setting of Lasix therapy. 5. Will start Xopenex nebulizer every 8 hours for faint wheezes. 6. For further evaluation, we'll order cardiac enzymes, 2-D echocardiogram, PT/PTT, hepatic function panel, TSH/free T4, and anemia studies.    Code Status: Full code Family Communication: Discussed with the daughter Disposition Plan: To be determined, but likely home when clinically improved.  Time spent: One hour.  [facility] Pager [phone]  If 7PM-7AM, please contact night-coverage [email or web address] Password [facility] 10/11/2011, 12:53 PM

## 2011-10-11 NOTE — ED Notes (Signed)
Patient placed on 2 liters of oxygen via nasal canula as verbally ordered by Dr. Lorenso Courier.

## 2011-10-11 NOTE — ED Notes (Signed)
Pt used bedside toilet.  

## 2011-10-11 NOTE — ED Notes (Signed)
Pt reports hx of CHF.  Pt has some swelling to ankles bilaterally.

## 2011-10-12 DIAGNOSIS — E119 Type 2 diabetes mellitus without complications: Secondary | ICD-10-CM

## 2011-10-12 DIAGNOSIS — I4891 Unspecified atrial fibrillation: Secondary | ICD-10-CM

## 2011-10-12 DIAGNOSIS — E876 Hypokalemia: Secondary | ICD-10-CM | POA: Insufficient documentation

## 2011-10-12 LAB — GLUCOSE, CAPILLARY
Glucose-Capillary: 155 mg/dL — ABNORMAL HIGH (ref 70–99)
Glucose-Capillary: 170 mg/dL — ABNORMAL HIGH (ref 70–99)
Glucose-Capillary: 105 mg/dL — ABNORMAL HIGH (ref 70–99)
Glucose-Capillary: 145 mg/dL — ABNORMAL HIGH (ref 70–99)

## 2011-10-12 LAB — BASIC METABOLIC PANEL
BUN: 12 mg/dL (ref 6–23)
Chloride: 92 mEq/L — ABNORMAL LOW (ref 96–112)
Creatinine, Ser: 1.35 mg/dL — ABNORMAL HIGH (ref 0.50–1.10)
Glucose, Bld: 111 mg/dL — ABNORMAL HIGH (ref 70–99)
Potassium: 3.1 mEq/L — ABNORMAL LOW (ref 3.5–5.1)

## 2011-10-12 LAB — CBC
HCT: 31.2 % — ABNORMAL LOW (ref 36.0–46.0)
Hemoglobin: 10.6 g/dL — ABNORMAL LOW (ref 12.0–15.0)
MCH: 32.9 pg (ref 26.0–34.0)
MCHC: 34 g/dL (ref 30.0–36.0)
MCV: 96.9 fL (ref 78.0–100.0)
RDW: 13.3 % (ref 11.5–15.5)

## 2011-10-12 LAB — IRON AND TIBC
Saturation Ratios: 14 % — ABNORMAL LOW (ref 20–55)
UIBC: 244 ug/dL (ref 125–400)

## 2011-10-12 LAB — PRO B NATRIURETIC PEPTIDE: Pro B Natriuretic peptide (BNP): 5551 pg/mL — ABNORMAL HIGH (ref 0–450)

## 2011-10-12 LAB — FERRITIN: Ferritin: 90 ng/mL (ref 10–291)

## 2011-10-12 MED ORDER — POTASSIUM CHLORIDE CRYS ER 20 MEQ PO TBCR
40.0000 meq | EXTENDED_RELEASE_TABLET | Freq: Three times a day (TID) | ORAL | Status: AC
  Administered 2011-10-12 – 2011-10-13 (×6): 40 meq via ORAL
  Filled 2011-10-12 (×6): qty 2

## 2011-10-12 MED ORDER — FUROSEMIDE 40 MG PO TABS
40.0000 mg | ORAL_TABLET | Freq: Two times a day (BID) | ORAL | Status: DC
  Administered 2011-10-12 – 2011-10-14 (×4): 40 mg via ORAL
  Filled 2011-10-12 (×6): qty 1

## 2011-10-12 MED ORDER — TRAZODONE HCL 50 MG PO TABS
50.0000 mg | ORAL_TABLET | Freq: Every evening | ORAL | Status: DC | PRN
  Administered 2011-10-12 – 2011-10-14 (×2): 50 mg via ORAL
  Filled 2011-10-12 (×3): qty 1

## 2011-10-12 MED ORDER — SODIUM CHLORIDE 0.9 % IJ SOLN
INTRAMUSCULAR | Status: AC
  Administered 2011-10-12: 10 mL
  Filled 2011-10-12: qty 3

## 2011-10-12 NOTE — Plan of Care (Signed)
Problem: Phase I Progression Outcomes Goal: Voiding-avoid urinary catheter unless indicated Outcome: Not Applicable Date Met:  10/12/11 Foley d/c today

## 2011-10-12 NOTE — Progress Notes (Addendum)
Chart reviewed.  Subjective: Breathing easier.  C/o fatigue, "washed out".  Has outpt stress test scheduled in Oct.  No chest pain.  C/o insomnia  Objective: Vital signs in last 24 hours: Filed Vitals:   10/11/11 1950 10/11/11 2055 10/12/11 0503 10/12/11 0732  BP:  125/63 138/68   Pulse:  69 67   Temp:  98.3 F (36.8 C) 98.1 F (36.7 C)   TempSrc:  Oral Oral   Resp:  20 20   Height:      Weight:   68.448 kg (150 lb 14.4 oz)   SpO2: 97% 96% 96% 97%   Weight change:   Intake/Output Summary (Last 24 hours) at 10/12/11 0843 Last data filed at 10/12/11 0017  Gross per 24 hour  Intake    240 ml  Output   3400 ml  Net  -3160 ml   Gen:  Comfortable. Talkative. Appropriate. Oriented Lungs clear to auscultation bilaterally without wheezes rhonchi or rales Cardiovascular regular rate rhythm without murmurs gallops rubs Abdomen soft nontender nondistended Extremities trace edema  Lab Results: Basic Metabolic Panel:  Lab 10/12/11 1610 10/11/11 0955  NA 134* 134*  K 3.1* 3.8  CL 92* 96  CO2 30 27  GLUCOSE 111* 161*  BUN 12 10  CREATININE 1.35* 1.21*  CALCIUM 8.9 9.5  MG -- --  PHOS -- --   Liver Function Tests:  Lab 10/11/11 0955  AST 27  ALT 15  ALKPHOS 75  BILITOT 0.9  PROT 6.9  ALBUMIN 3.9   No results found for this basename: LIPASE:2,AMYLASE:2 in the last 168 hours No results found for this basename: AMMONIA:2 in the last 168 hours CBC:  Lab 10/12/11 0448 10/11/11 0955  WBC 5.8 7.8  NEUTROABS -- --  HGB 10.6* 11.6*  HCT 31.2* 34.3*  MCV 96.9 98.6  PLT 266 278   Cardiac Enzymes:  Lab 10/12/11 0448 10/11/11 2304 10/11/11 1700  CKTOTAL -- -- --  CKMB -- -- --  CKMBINDEX -- -- --  TROPONINI <0.30 <0.30 <0.30   BNP:  Lab 10/12/11 0448 10/11/11 0955  PROBNP 5551.0* 2492.0*   D-Dimer: No results found for this basename: DDIMER:2 in the last 168 hours CBG:  Lab 10/12/11 0743 10/11/11 2052 10/11/11 1625 10/11/11 1303  GLUCAP 155* 166* 118* 166*     Hemoglobin A1C:  Lab 10/11/11 0955  HGBA1C 7.0*   Fasting Lipid Panel: No results found for this basename: CHOL,HDL,LDLCALC,TRIG,CHOLHDL,LDLDIRECT in the last 960 hours Thyroid Function Tests:  Lab 10/11/11 0955  TSH 2.311  T4TOTAL --  FREET4 1.63  T3FREE --  THYROIDAB --   Coagulation:  Lab 10/11/11 0955  LABPROT 14.9  INR 1.19   Anemia Panel: No results found for this basename: VITAMINB12,FOLATE,FERRITIN,TIBC,IRON,RETICCTPCT in the last 168 hours Urine Drug Screen: Drugs of Abuse  No results found for this basename: labopia, cocainscrnur, labbenz, amphetmu, thcu, labbarb    Alcohol Level: No results found for this basename: ETH:2 in the last 168 hours Urinalysis:  Lab 10/11/11 1835  COLORURINE STRAW*  LABSPEC 1.010  PHURINE 7.0  GLUCOSEU NEGATIVE  HGBUR TRACE*  BILIRUBINUR NEGATIVE  KETONESUR NEGATIVE  PROTEINUR NEGATIVE  UROBILINOGEN 0.2  NITRITE NEGATIVE  LEUKOCYTESUR NEGATIVE   Micro Results: No results found for this or any previous visit (from the past 240 hour(s)). Studies/Results: Dg Chest Port 1 View  10/11/2011  *RADIOLOGY REPORT*  Clinical Data: Chest pain, shortness of breath, CHF  PORTABLE CHEST - 1 VIEW  Comparison: 03/25/2011  Findings: Chronic interstitial  markings/emphysematous changes.  Patchy/interstitial opacities in the left upper lobe, right perihilar region, and right lower lobe, favored to reflect superimposed interstitial edema, less likely multifocal infection.  No definite pleural effusion.  No pneumothorax.  The heart is normal in size of subclavian pacemaker.  IMPRESSION: Multifocal patchy/interstitial opacities, favored to reflect interstitial edema, likely multifocal infection.  Underlying chronic interstitial markings/emphysematous changes.   Original Report Authenticated By: Charline Bills, M.D.    Scheduled Meds:   . antiseptic oral rinse  15 mL Mouth Rinse BID  . aspirin EC  325 mg Oral Daily  . carvedilol  12.5 mg Oral  BID WC  . docusate sodium  100 mg Oral BID  . enoxaparin (LOVENOX) injection  40 mg Subcutaneous Q24H  . famotidine  20 mg Oral Daily  . furosemide  60 mg Intravenous STAT  . furosemide  60 mg Intravenous Q12H  . insulin aspart  0-5 Units Subcutaneous QHS  . insulin aspart  0-9 Units Subcutaneous TID WC  . insulin glargine  5 Units Subcutaneous QHS  . levalbuterol  0.63 mg Nebulization Q6H  . losartan  100 mg Oral QHS  . nitrofurantoin  50 mg Oral Daily  . nitroGLYCERIN  1 inch Topical Once  . nitroGLYCERIN  1 inch Topical Q8H  . potassium chloride  20 mEq Oral BID  . simvastatin  40 mg Oral QHS  . sodium chloride      . DISCONTD: simvastatin  40 mg Oral QHS   Continuous Infusions:  PRN Meds:.acetaminophen, acetaminophen, alum & mag hydroxide-simeth, ondansetron (ZOFRAN) IV, ondansetron, oxyCODONE, DISCONTD: antiseptic oral rinse Assessment/Plan: Principal Problem:  *CHF exacerbation, new Active Problems:  Hypertension  DM type 2 (diabetes mellitus, type 2)  Paroxysmal a-fib, on AMIO, refuses Coumadin  CAD, moderate by cath 2006  Anemia  Chest heaviness  Hypokalemia  Acute renal insufficiency Insomnia  Change Lasix to by mouth. Discontinue Foley catheter. Patient has had a good diuresis and symptoms improved, despite elevated pro BNP. Replete potassium by mouth. Await echocardiogram. Increase activity. Discontinue nitro paste. Trazodone as needed for sleep.   LOS: 1 day   Kathleen [name] 10/12/2011, 8:43 AM

## 2011-10-13 DIAGNOSIS — I10 Essential (primary) hypertension: Secondary | ICD-10-CM

## 2011-10-13 LAB — GLUCOSE, CAPILLARY
Glucose-Capillary: 104 mg/dL — ABNORMAL HIGH (ref 70–99)
Glucose-Capillary: 167 mg/dL — ABNORMAL HIGH (ref 70–99)
Glucose-Capillary: 185 mg/dL — ABNORMAL HIGH (ref 70–99)

## 2011-10-13 LAB — BASIC METABOLIC PANEL
CO2: 31 mEq/L (ref 19–32)
Calcium: 8.6 mg/dL (ref 8.4–10.5)
Chloride: 98 mEq/L (ref 96–112)
Creatinine, Ser: 1.47 mg/dL — ABNORMAL HIGH (ref 0.50–1.10)
Glucose, Bld: 98 mg/dL (ref 70–99)

## 2011-10-13 MED ORDER — LIVING BETTER WITH HEART FAILURE BOOK
Freq: Once | Status: AC
  Administered 2011-10-13: 12:00:00
  Filled 2011-10-13: qty 1

## 2011-10-13 MED ORDER — ENOXAPARIN SODIUM 30 MG/0.3ML ~~LOC~~ SOLN
30.0000 mg | SUBCUTANEOUS | Status: DC
  Administered 2011-10-13: 30 mg via SUBCUTANEOUS
  Filled 2011-10-13 (×2): qty 0.3

## 2011-10-13 NOTE — Plan of Care (Signed)
Problem: Phase III Progression Outcomes Goal: Discharge plan remains appropriate-arrangements made Outcome: Completed/Met Date Met:  10/13/11 Plans to return home with daughter at discharge.

## 2011-10-13 NOTE — Progress Notes (Signed)
Nutrition Brief Note  Patient identified on the Malnutrition Screening Tool (MST) report for losing weight without trying, generating a score of 2.   Body mass index is 22.84 kg/(m^2). Pt meets criteria for normal based on current BMI.   Current diet order is CHO Modified/No added salt, she is consuming approximately 75-100% of meals at this time. Labs and medications reviewed.   No nutrition interventions warranted at this time. If nutrition issues arise, please consult RD.   #[phone]

## 2011-10-13 NOTE — Progress Notes (Signed)
Subjective: No SOB or CP  Objective: Vital signs in last 24 hours: Filed Vitals:   10/12/11 2034 10/13/11 0658 10/13/11 0702 10/13/11 0950  BP: 141/74 134/69  146/72  Pulse: 63 66  70  Temp: 98.2 F (36.8 C) 98.1 F (36.7 C)    TempSrc: Oral Oral    Resp: 18 18    Height:      Weight:  68.13 kg (150 lb 3.2 oz)    SpO2: 93% 95% 94% 94%   Weight change: -2.17 kg (-4 lb 12.5 oz)  Intake/Output Summary (Last 24 hours) at 10/13/11 1142 Last data filed at 10/12/11 1700  Gross per 24 hour  Intake    480 ml  Output      0 ml  Net    480 ml   Gen:  Comfortable. Talkative. Appropriate. Oriented Lungs clear to auscultation bilaterally without wheezes rhonchi or rales Cardiovascular regular rate rhythm without murmurs gallops rubs Abdomen soft nontender nondistended Extremities edema gone  Lab Results: Basic Metabolic Panel:  Lab 10/13/11 0981 10/12/11 0448  NA 136 134*  K 4.3 3.1*  CL 98 92*  CO2 31 30  GLUCOSE 98 111*  BUN 12 12  CREATININE 1.47* 1.35*  CALCIUM 8.6 8.9  MG -- --  PHOS -- --   Liver Function Tests:  Lab 10/11/11 0955  AST 27  ALT 15  ALKPHOS 75  BILITOT 0.9  PROT 6.9  ALBUMIN 3.9   No results found for this basename: LIPASE:2,AMYLASE:2 in the last 168 hours No results found for this basename: AMMONIA:2 in the last 168 hours CBC:  Lab 10/12/11 0448 10/11/11 0955  WBC 5.8 7.8  NEUTROABS -- --  HGB 10.6* 11.6*  HCT 31.2* 34.3*  MCV 96.9 98.6  PLT 266 278   Cardiac Enzymes:  Lab 10/12/11 0448 10/11/11 2304 10/11/11 1700  CKTOTAL -- -- --  CKMB -- -- --  CKMBINDEX -- -- --  TROPONINI <0.30 <0.30 <0.30   BNP:  Lab 10/12/11 0448 10/11/11 0955  PROBNP 5551.0* 2492.0*   D-Dimer: No results found for this basename: DDIMER:2 in the last 168 hours CBG:  Lab 10/13/11 0751 10/12/11 2031 10/12/11 1630 10/12/11 1147 10/12/11 1005 10/12/11 0743  GLUCAP 104* 145* 105* 105* 170* 155*   Hemoglobin A1C:  Lab 10/11/11 0955  HGBA1C 7.0*     Fasting Lipid Panel: No results found for this basename: CHOL,HDL,LDLCALC,TRIG,CHOLHDL,LDLDIRECT in the last 191 hours Thyroid Function Tests:  Lab 10/11/11 0955  TSH 2.311  T4TOTAL --  FREET4 1.63  T3FREE --  THYROIDAB --   Coagulation:  Lab 10/11/11 0955  LABPROT 14.9  INR 1.19   Anemia Panel:  Lab 10/12/11 0448  VITAMINB12 483  FOLATE --  FERRITIN 90  TIBC 285  IRON 41*  RETICCTPCT --   Urine Drug Screen: Drugs of Abuse  No results found for this basename: labopia,  cocainscrnur,  labbenz,  amphetmu,  thcu,  labbarb    Alcohol Level: No results found for this basename: ETH:2 in the last 168 hours Urinalysis:  Lab 10/11/11 1835  COLORURINE STRAW*  LABSPEC 1.010  PHURINE 7.0  GLUCOSEU NEGATIVE  HGBUR TRACE*  BILIRUBINUR NEGATIVE  KETONESUR NEGATIVE  PROTEINUR NEGATIVE  UROBILINOGEN 0.2  NITRITE NEGATIVE  LEUKOCYTESUR NEGATIVE   Micro Results: No results found for this or any previous visit (from the past 240 hour(s)). Studies/Results: No results found. Scheduled Meds:    . antiseptic oral rinse  15 mL Mouth Rinse BID  .  aspirin EC  325 mg Oral Daily  . carvedilol  12.5 mg Oral BID WC  . docusate sodium  100 mg Oral BID  . enoxaparin (LOVENOX) injection  30 mg Subcutaneous Q24H  . famotidine  20 mg Oral Daily  . furosemide  40 mg Oral BID  . insulin aspart  0-5 Units Subcutaneous QHS  . insulin aspart  0-9 Units Subcutaneous TID WC  . insulin glargine  5 Units Subcutaneous QHS  . levalbuterol  0.63 mg Nebulization Q6H  . losartan  100 mg Oral QHS  . nitrofurantoin  50 mg Oral Daily  . potassium chloride  40 mEq Oral TID  . simvastatin  40 mg Oral QHS  . DISCONTD: enoxaparin (LOVENOX) injection  40 mg Subcutaneous Q24H   Continuous Infusions:  PRN Meds:.acetaminophen, acetaminophen, alum & mag hydroxide-simeth, ondansetron (ZOFRAN) IV, ondansetron, oxyCODONE, traZODone Assessment/Plan: Principal Problem:  *CHF exacerbation, new Active  Problems:  Hypertension  DM type 2 (diabetes mellitus, type 2)  Paroxysmal a-fib, on AMIO, refuses Coumadin  CAD, moderate by cath 2006  Anemia  Chest heaviness  Hypokalemia  Acute renal insufficiency Insomnia  Await echo. Continue current   LOS: 2 days   Jyasia Markoff L 10/13/2011, 11:42 AM

## 2011-10-13 NOTE — Progress Notes (Signed)
*  PRELIMINARY RESULTS* Echocardiogram 2D Echocardiogram has been performed.  Kathleen [name] 10/13/2011, 3:36 PM

## 2011-10-13 NOTE — Plan of Care (Signed)
Problem: Phase II Progression Outcomes Goal: Discharge plan established Outcome: Completed/Met Date Met:  10/13/11 Pt plans to return home with daughter at discharge.

## 2011-10-13 NOTE — Plan of Care (Signed)
Problem: Phase II Progression Outcomes Goal: Progress activity as tolerated unless otherwise ordered Outcome: Completed/Met Date Met:  10/13/11 Pt ambulating on unit without difficulty.  Tolerated well.

## 2011-10-13 NOTE — Care Management Note (Signed)
    Page 1 of 1   10/14/2011     11:27:22 AM   CARE MANAGEMENT NOTE 10/14/2011  Patient:  Kathleen [name], Kathleen [name]   Account Number:  [id number]  Date Initiated:  10/13/2011  Documentation initiated by:  Rosemary Holms  Subjective/Objective Assessment:   Pt admitted from home where she lives with her daughter. Admitted CHF. Pt and daughter do not anticipate any HH needs.     Action/Plan:   Following Echo results, Pt will be transfered to [facility] for Cath   Anticipated DC Date:  10/14/2011   Anticipated DC Plan:  HOME/SELF CARE      DC Planning Services  CM consult      Choice offered to / List presented to:             Status of service:  Completed, signed off Medicare Important Message given?  YES (If response is "NO", the following Medicare IM given date fields will be blank) Date Medicare IM given:  10/13/2011 Date Additional Medicare IM given:    Discharge Disposition:  ACUTE TO ACUTE TRANS  Per UR Regulation:    If discussed at Long Length of Stay Meetings, dates discussed:    Comments:  10/14/11 Rosemary Holms RN BSN CM 10/13/11 1155 Kathleen [name] Kathleen Hawking RN BSN CM

## 2011-10-14 ENCOUNTER — Ambulatory Visit (HOSPITAL_COMMUNITY): Admit: 2011-10-14 | Payer: Self-pay | Admitting: Cardiovascular Disease

## 2011-10-14 ENCOUNTER — Encounter (HOSPITAL_COMMUNITY): Payer: Self-pay | Admitting: General Practice

## 2011-10-14 DIAGNOSIS — I420 Dilated cardiomyopathy: Secondary | ICD-10-CM | POA: Diagnosis present

## 2011-10-14 DIAGNOSIS — Z950 Presence of cardiac pacemaker: Secondary | ICD-10-CM | POA: Insufficient documentation

## 2011-10-14 LAB — URINE CULTURE: Colony Count: NO GROWTH

## 2011-10-14 LAB — BASIC METABOLIC PANEL
BUN: 11 mg/dL (ref 6–23)
Calcium: 8.9 mg/dL (ref 8.4–10.5)
GFR calc Af Amer: 37 mL/min — ABNORMAL LOW (ref >90)
GFR calc non Af Amer: 32 mL/min — ABNORMAL LOW (ref >90)
Glucose, Bld: 124 mg/dL — ABNORMAL HIGH (ref 70–99)
Potassium: 4.7 mEq/L (ref 3.5–5.1)
Sodium: 136 mEq/L (ref 135–145)

## 2011-10-14 MED ORDER — LEVALBUTEROL TARTRATE 45 MCG/ACT IN AERO
2.0000 | INHALATION_SPRAY | Freq: Three times a day (TID) | RESPIRATORY_TRACT | Status: DC
  Administered 2011-10-15 – 2011-10-17 (×7): 2 via RESPIRATORY_TRACT
  Filled 2011-10-14: qty 15

## 2011-10-14 MED ORDER — LEVALBUTEROL HCL 0.63 MG/3ML IN NEBU
0.6300 mg | INHALATION_SOLUTION | Freq: Four times a day (QID) | RESPIRATORY_TRACT | Status: DC
  Administered 2011-10-14: 0.63 mg via RESPIRATORY_TRACT
  Filled 2011-10-14 (×6): qty 3

## 2011-10-14 MED ORDER — LEVALBUTEROL HCL 0.63 MG/3ML IN NEBU
0.6300 mg | INHALATION_SOLUTION | RESPIRATORY_TRACT | Status: DC | PRN
  Administered 2011-10-14: 0.63 mg via RESPIRATORY_TRACT
  Filled 2011-10-14: qty 3

## 2011-10-14 MED ORDER — SODIUM CHLORIDE 0.9 % IV SOLN: 250.0000 mL | INTRAVENOUS | Status: DC | PRN

## 2011-10-14 MED ORDER — SODIUM CHLORIDE 0.9 % IJ SOLN: 3.0000 mL | INTRAMUSCULAR | Status: DC | PRN

## 2011-10-14 MED ORDER — LEVALBUTEROL TARTRATE 45 MCG/ACT IN AERO: 2.0000 | INHALATION_SPRAY | RESPIRATORY_TRACT | Status: DC | PRN

## 2011-10-14 MED ORDER — ASPIRIN 81 MG PO CHEW
324.0000 mg | CHEWABLE_TABLET | ORAL | Status: AC
  Administered 2011-10-15: 324 mg via ORAL
  Filled 2011-10-14: qty 4

## 2011-10-14 MED ORDER — SODIUM CHLORIDE 0.45 % IV SOLN
INTRAVENOUS | Status: DC
  Administered 2011-10-14: 18:00:00 via INTRAVENOUS

## 2011-10-14 MED ORDER — DIAZEPAM 2 MG PO TABS
2.0000 mg | ORAL_TABLET | ORAL | Status: AC
  Administered 2011-10-15: 2 mg via ORAL
  Filled 2011-10-14: qty 1

## 2011-10-14 NOTE — Progress Notes (Signed)
Report called to Wilber Oliphant, RN at [facility] Unit [phone].  Pt stable at time of transfer via [facility].

## 2011-10-14 NOTE — Progress Notes (Signed)
PROGRESS/TRANSFER NOTE  Subjective: No SOB or CP. Feels much better than on admission.  Objective: Vital signs in last 24 hours: Filed Vitals:   10/14/11 0337 10/14/11 0417 10/14/11 0531 10/14/11 0700  BP:  126/65    Pulse:  64    Temp:  97.9 F (36.6 C)    TempSrc:  Oral    Resp:  18    Height:      Weight: 68.4 kg (150 lb 12.7 oz)     SpO2:  95% 95% 95%   Weight change: 0.27 kg (9.5 oz)  Intake/Output Summary (Last 24 hours) at 10/14/11 0927 Last data filed at 10/13/11 2142  Gross per 24 hour  Intake    240 ml  Output      0 ml  Net    240 ml   Gen:  Comfortable. Talkative. Appropriate. Oriented. Reading the newspaper Lungs clear to auscultation bilaterally without wheezes rhonchi or rales Cardiovascular regular rate rhythm without murmurs gallops rubs Abdomen soft nontender nondistended Extremities edema gone  Lab Results: Basic Metabolic Panel:  Lab 10/14/11 8295 10/13/11 0521  NA 136 136  K 4.7 4.3  CL 99 98  CO2 28 31  GLUCOSE 124* 98  BUN 11 12  CREATININE 1.44* 1.47*  CALCIUM 8.9 8.6  MG -- --  PHOS -- --   Liver Function Tests:  Lab 10/11/11 0955  AST 27  ALT 15  ALKPHOS 75  BILITOT 0.9  PROT 6.9  ALBUMIN 3.9   No results found for this basename: LIPASE:2,AMYLASE:2 in the last 168 hours No results found for this basename: AMMONIA:2 in the last 168 hours CBC:  Lab 10/12/11 0448 10/11/11 0955  WBC 5.8 7.8  NEUTROABS -- --  HGB 10.6* 11.6*  HCT 31.2* 34.3*  MCV 96.9 98.6  PLT 266 278   Cardiac Enzymes:  Lab 10/12/11 0448 10/11/11 2304 10/11/11 1700  CKTOTAL -- -- --  CKMB -- -- --  CKMBINDEX -- -- --  TROPONINI <0.30 <0.30 <0.30   BNP:  Lab 10/12/11 0448 10/11/11 0955  PROBNP 5551.0* 2492.0*   D-Dimer: No results found for this basename: DDIMER:2 in the last 168 hours CBG:  Lab 10/14/11 0748 10/13/11 2057 10/13/11 1654 10/13/11 1149 10/13/11 0751 10/12/11 2031  GLUCAP 130* 185* 133* 167* 104* 145*   Hemoglobin  A1C:  Lab 10/11/11 0955  HGBA1C 7.0*   Fasting Lipid Panel: No results found for this basename: CHOL,HDL,LDLCALC,TRIG,CHOLHDL,LDLDIRECT in the last 621 hours Thyroid Function Tests:  Lab 10/11/11 0955  TSH 2.311  T4TOTAL --  FREET4 1.63  T3FREE --  THYROIDAB --   Coagulation:  Lab 10/11/11 0955  LABPROT 14.9  INR 1.19   Anemia Panel:  Lab 10/12/11 0448  VITAMINB12 483  FOLATE --  FERRITIN 90  TIBC 285  IRON 41*  RETICCTPCT --   Urine Drug Screen: Drugs of Abuse  No results found for this basename: labopia,  cocainscrnur,  labbenz,  amphetmu,  thcu,  labbarb    Alcohol Level: No results found for this basename: ETH:2 in the last 168 hours Urinalysis:  Lab 10/11/11 1835  COLORURINE STRAW*  LABSPEC 1.010  PHURINE 7.0  GLUCOSEU NEGATIVE  HGBUR TRACE*  BILIRUBINUR NEGATIVE  KETONESUR NEGATIVE  PROTEINUR NEGATIVE  UROBILINOGEN 0.2  NITRITE NEGATIVE  LEUKOCYTESUR NEGATIVE   Micro Results: Recent Results (from the past 240 hour(s))  URINE CULTURE     Status: Normal   Collection Time   10/11/11  6:45 PM  Component Value Range Status Comment   Specimen Description URINE, CATHETERIZED   Final    Special Requests NONE   Final    Culture  Setup Time 10/12/2011 21:32   Final    Colony Count NO GROWTH   Final    Culture NO GROWTH   Final    Report Status 10/14/2011 FINAL   Final    Studies/Results: No results found. Scheduled Meds:    . a stronger pump book   Does not apply Once  . antiseptic oral rinse  15 mL Mouth Rinse BID  . aspirin EC  325 mg Oral Daily  . carvedilol  12.5 mg Oral BID WC  . docusate sodium  100 mg Oral BID  . enoxaparin (LOVENOX) injection  30 mg Subcutaneous Q24H  . famotidine  20 mg Oral Daily  . furosemide  40 mg Oral BID  . insulin aspart  0-5 Units Subcutaneous QHS  . insulin aspart  0-9 Units Subcutaneous TID WC  . insulin glargine  5 Units Subcutaneous QHS  . levalbuterol  0.63 mg Nebulization Q6H WA  . losartan  100  mg Oral QHS  . nitrofurantoin  50 mg Oral Daily  . potassium chloride  40 mEq Oral TID  . simvastatin  40 mg Oral QHS  . DISCONTD: enoxaparin (LOVENOX) injection  40 mg Subcutaneous Q24H  . DISCONTD: levalbuterol  0.63 mg Nebulization Q6H   Continuous Infusions:  PRN Meds:.acetaminophen, acetaminophen, alum & mag hydroxide-simeth, ondansetron (ZOFRAN) IV, ondansetron, oxyCODONE, traZODone Assessment/Plan: Principal Problem:  *CHF exacerbation, systolic, new. Currently compensated. Patient has diuresed over 2 [name] since admission. Weight is down 5 pounds since admission. She already on beta blocker, lasix, asa and ARB. Official echocardiogram report is pending, but I discussed the case with Dr. Royann Shivers, who reports that ejection fraction is around 30%, and there are possible wall motion abnormalities. He recommends transferring the patient to [facility] for cardiac catheterization. Patient is agreeable. I have called care link to arrange transportation. She will be NPO and has already eaten breakfast. COBRA signed. Active Problems:  Hypertension  DM type 2 (diabetes mellitus, type 2)  Paroxysmal a-fib, on AMIO, refuses Coumadin  CAD, moderate by cath 2006  Anemia  Acute renal insufficiency   LOS: 3 days   Kathleen [name] 10/14/2011, 9:27 AM

## 2011-10-14 NOTE — Consult Note (Signed)
Reason for Consult: Acute systolic CHF with new WMA/LVD on echo  Requesting Physician: [facility]  HPI: This is a 78 y.o. female with a past medical history significant for minor CAD in 2006 with a 50% RCA. She had good LVF on her last assessment. She has had PAF/SSS/AVB and had a pacemaker implanted in March 2013. She was admitted  to [facility] 10/11/11 with dyspnea. Workup suggested acute systolic CHF with a new WMA on echo and an EF of 30%. She also had SSCP worrisome for [place]. She is transferred now to [facility] for cath to r/o progression of CAD.   PMHx:  Past Medical History  Diagnosis Date  . Hypertension   . Diabetes mellitus   . High cholesterol   . Atrial fibrillation   . Paroxysmal a-fib 12/13/2010  . PNA (pneumonia)   . CAD (coronary artery disease) 2006  . Acid reflux   . AV block, 2nd degree     S/p Pacemaker 03/2011   Past Surgical History  Procedure Date  . Replacement total knee   . Bladder tact   . Pacemaker insertion   . Cholecystectomy     FAMHx: Family History  Problem Relation Age of Onset  . Diabetes Brother   . Heart murmur Daughter     SOCHx:  reports that she has never smoked. She does not have any smokeless tobacco history on file. She reports that she does not drink alcohol or use illicit drugs.  ALLERGIES: Allergies  Allergen Reactions  . Coumadin (Warfarin Sodium) Other (See Comments)    Caused Patient to Bleed Out.   . Meloxicam Other (See Comments)    Unknown  . Morphine Itching    Not in right state of mind.   . Sulfonamide Derivatives Hives    ROS: Pertinent items are noted in HPI.  HOME MEDICATIONS: Prescriptions prior to admission  Medication Sig Dispense Refill  . acetaminophen (TYLENOL) 325 MG tablet Take 1-2 tablets (325-650 mg total) by mouth every 4 (four) hours as needed.      Marland Kitchen albuterol (PROVENTIL) (2.5 MG/3ML) 0.083% nebulizer solution Take 2.5 mg by nebulization every 6 (six) hours as needed. Congestion      .  alendronate (FOSAMAX) 35 MG tablet Take 35 mg by mouth every 7 (seven) days. Take with a full glass of water on an empty stomach.patient takes on Sunday      . amLODipine (NORVASC) 5 MG tablet Take 1 tablet (5 mg total) by mouth daily.  30 tablet  1  . aspirin EC 325 MG tablet Take 325 mg by mouth daily.      . carvedilol (COREG) 12.5 MG tablet Take 12.5 mg by mouth 2 (two) times daily with a meal.      . hydrochlorothiazide (MICROZIDE) 12.5 MG capsule Take 12.5 mg by mouth daily.      Marland Kitchen losartan (COZAAR) 50 MG tablet Take 100 mg by mouth at bedtime.      . metFORMIN (GLUCOPHAGE) 500 MG tablet Take 500 mg by mouth 2 (two) times daily with a meal.      . Multiple Vitamins-Minerals (CENTRUM SILVER PO) Take 1 tablet by mouth daily.      . nitrofurantoin (MACRODANTIN) 50 MG capsule Take 50 mg by mouth daily.      . simvastatin (ZOCOR) 40 MG tablet Take 40 mg by mouth at bedtime.          HOSPITAL MEDICATIONS: I have reviewed the patient's current medications.  VITALS: Blood pressure  127/70, pulse 76, temperature 97.8 F (36.6 C), temperature source Oral, resp. rate 16, height 5\' 8"  (1.727 m), weight 67.3 kg (148 lb 5.9 oz), SpO2 97.00%.  PHYSICAL EXAM: General appearance: alert, cooperative and no distress Neck: no carotid bruit, no JVD, supple, symmetrical, trachea midline and thyroid not enlarged, symmetric, no tenderness/mass/nodules Lungs: clear to auscultation bilaterally Heart: regular rate and rhythm Abdomen: soft, non-tender; bowel sounds normal; no masses,  no organomegaly Extremities: extremities normal, atraumatic, no cyanosis or edema Pulses: 2+ and symmetric Skin: Skin color, texture, turgor normal. No rashes or lesions Neurologic: Grossly normal  LABS: Results for orders placed during the hospital encounter of 10/11/11 (from the past 48 hour(s))  GLUCOSE, CAPILLARY     Status: Abnormal   Collection Time   10/12/11  4:30 PM      Component Value Range Comment    Glucose-Capillary 105 (*) 70 - 99 mg/dL    Comment 1 Notify RN      Comment 2 Documented in Chart     GLUCOSE, CAPILLARY     Status: Abnormal   Collection Time   10/12/11  8:31 PM      Component Value Range Comment   Glucose-Capillary 145 (*) 70 - 99 mg/dL   BASIC METABOLIC PANEL     Status: Abnormal   Collection Time   10/13/11  5:21 AM      Component Value Range Comment   Sodium 136  135 - 145 mEq/L    Potassium 4.3  3.5 - 5.1 mEq/L DELTA CHECK NOTED   Chloride 98  96 - 112 mEq/L    CO2 31  19 - 32 mEq/L    Glucose, Bld 98  70 - 99 mg/dL    BUN 12  6 - 23 mg/dL    Creatinine, Ser 1.47 (*) 0.50 - 1.10 mg/dL    Calcium 8.6  8.4 - 82.9 mg/dL    GFR calc non Af Amer 32 (*) >90 mL/min    GFR calc Af Amer 37 (*) >90 mL/min   GLUCOSE, CAPILLARY     Status: Abnormal   Collection Time   10/13/11  7:51 AM      Component Value Range Comment   Glucose-Capillary 104 (*) 70 - 99 mg/dL   GLUCOSE, CAPILLARY     Status: Abnormal   Collection Time   10/13/11 11:49 AM      Component Value Range Comment   Glucose-Capillary 167 (*) 70 - 99 mg/dL   GLUCOSE, CAPILLARY     Status: Abnormal   Collection Time   10/13/11  4:54 PM      Component Value Range Comment   Glucose-Capillary 133 (*) 70 - 99 mg/dL   GLUCOSE, CAPILLARY     Status: Abnormal   Collection Time   10/13/11  8:57 PM      Component Value Range Comment   Glucose-Capillary 185 (*) 70 - 99 mg/dL   BASIC METABOLIC PANEL     Status: Abnormal   Collection Time   10/14/11  5:09 AM      Component Value Range Comment   Sodium 136  135 - 145 mEq/L    Potassium 4.7  3.5 - 5.1 mEq/L    Chloride 99  96 - 112 mEq/L    CO2 28  19 - 32 mEq/L    Glucose, Bld 124 (*) 70 - 99 mg/dL    BUN 11  6 - 23 mg/dL    Creatinine, Ser 5.62 (*) 0.50 - 1.10  mg/dL    Calcium 8.9  8.4 - 16.1 mg/dL    GFR calc non Af Amer 32 (*) >90 mL/min    GFR calc Af Amer 37 (*) >90 mL/min   GLUCOSE, CAPILLARY     Status: Abnormal   Collection Time   10/14/11  7:48 AM       Component Value Range Comment   Glucose-Capillary 130 (*) 70 - 99 mg/dL    Comment 1 Notify RN     GLUCOSE, CAPILLARY     Status: Abnormal   Collection Time   10/14/11 11:41 AM      Component Value Range Comment   Glucose-Capillary 186 (*) 70 - 99 mg/dL    Comment 1 Notify RN      EKG-paced  IMAGING: No results found. CHF on 10/11/11  IMPRESSION: Principal Problem:  *Acute systolic CHF  Active Problems:  Unstable angina  Cardiomyopathy, New EF 30% with WMA, r/o CAD progression  CAD, moderate by cath 2006, 50% RCA  Acute renal insufficiency, (Scr was WNL in April 2013)  Hypertension  DM type 2 (diabetes mellitus, type 2)  Paroxysmal a-fib, refuses Coumadin  Anemia  S/P placement of cardiac pacemaker, March 2013   RECOMMENDATION: Coronary angiogram. I held diuretics and ARB but she has already recieved those today. Follow BMP closely post cath   Time Spent Directly with Patient: 40  minutes  KILROY,LUKE K 10/14/2011, 1:59 PM   I have seen and examined the patient along with Corine Shelter, PA.  I have reviewed the chart, notes and new data.  I agree with PA/NP's note.  There is evidence of substantial deterioration in LV systolic function (and clear echo evidence of decompensation with stage II diastolic dysfunction) as a cause for her newly diagnosed heart failure.  The echo appears to show ischemic dysfunction at least in the territory of the RCA, while interpretation of apical and septal motion is hampered by RV apical pacing and profound ventricular dyssynchrony.   She needs coronary angiography first, but now has acute (pre)renal insufficiency due to aggressive diuresis and is vulnerable to the risk of contrast nephrotoxicity. Delay cath until tomorrow, after overnight hydration.  If there is no new coronary obstruction, consideration can be given to CRT-P. She has been on carvedilol and ARB Rx already, although there is room for more complete RAAS  inhibition.   Thurmon Fair, MD, [facility] [phone] 10/14/2011, 3:00 PM

## 2011-10-15 ENCOUNTER — Encounter (HOSPITAL_COMMUNITY)
Admission: EM | Disposition: A | Discharge status: Home / Self Care | Payer: Self-pay | Source: Home / Self Care | Attending: Cardiovascular Disease

## 2011-10-15 HISTORY — PX: CARDIAC CATHETERIZATION: SHX172

## 2011-10-15 HISTORY — PX: LEFT AND RIGHT HEART CATHETERIZATION WITH CORONARY ANGIOGRAM: SHX5449

## 2011-10-15 LAB — BASIC METABOLIC PANEL
BUN: 13 mg/dL (ref 6–23)
CO2: 29 mEq/L (ref 19–32)
Chloride: 98 mEq/L (ref 96–112)
GFR calc non Af Amer: 31 mL/min — ABNORMAL LOW (ref >90)
Glucose, Bld: 120 mg/dL — ABNORMAL HIGH (ref 70–99)
Potassium: 4.1 mEq/L (ref 3.5–5.1)

## 2011-10-15 LAB — GLUCOSE, CAPILLARY: Glucose-Capillary: 125 mg/dL — ABNORMAL HIGH (ref 70–99)

## 2011-10-15 LAB — CBC
HCT: 33.1 % — ABNORMAL LOW (ref 36.0–46.0)
Hemoglobin: 11.1 g/dL — ABNORMAL LOW (ref 12.0–15.0)
MCH: 32.9 pg (ref 26.0–34.0)
MCHC: 33.5 g/dL (ref 30.0–36.0)
MCV: 98.2 fL (ref 78.0–100.0)
Platelets: 274 10*3/uL (ref 150–400)
RBC: 3.37 MIL/uL — ABNORMAL LOW (ref 3.87–5.11)
RDW: 13.8 % (ref 11.5–15.5)
WBC: 6.4 10*3/uL (ref 4.0–10.5)

## 2011-10-15 LAB — POCT I-STAT 3, VENOUS BLOOD GAS (G3P V)
Acid-Base Excess: 3 mmol/L — ABNORMAL HIGH (ref 0.0–2.0)
Bicarbonate: 28.5 mEq/L — ABNORMAL HIGH (ref 20.0–24.0)
pO2, Ven: 33 mmHg (ref 30.0–45.0)

## 2011-10-15 LAB — POCT I-STAT 3, ART BLOOD GAS (G3+)
Bicarbonate: 25.5 mEq/L — ABNORMAL HIGH (ref 20.0–24.0)
pH, Arterial: 7.445 (ref 7.350–7.450)
pO2, Arterial: 64 mmHg — ABNORMAL LOW (ref 80.0–100.0)

## 2011-10-15 LAB — PROTIME-INR: INR: 1.2 (ref 0.00–1.49)

## 2011-10-15 SURGERY — LEFT AND RIGHT HEART CATHETERIZATION WITH CORONARY ANGIOGRAM
Anesthesia: LOCAL

## 2011-10-15 SURGERY — LEFT HEART CATHETERIZATION WITH CORONARY ANGIOGRAM
Anesthesia: LOCAL

## 2011-10-15 MED ORDER — ONDANSETRON HCL 4 MG/2ML IJ SOLN: 4.0000 mg | Freq: Four times a day (QID) | INTRAMUSCULAR | Status: DC | PRN

## 2011-10-15 MED ORDER — HEPARIN (PORCINE) IN NACL 2-0.9 UNIT/ML-% IJ SOLN
INTRAMUSCULAR | Status: AC
  Filled 2011-10-15: qty 1000

## 2011-10-15 MED ORDER — ACETAMINOPHEN 325 MG PO TABS: 650.0000 mg | ORAL_TABLET | ORAL | Status: DC | PRN

## 2011-10-15 MED ORDER — LIDOCAINE HCL (PF) 1 % IJ SOLN
INTRAMUSCULAR | Status: AC
  Filled 2011-10-15: qty 30

## 2011-10-15 MED ORDER — SODIUM CHLORIDE 0.9 % IV SOLN
INTRAVENOUS | Status: DC
  Administered 2011-10-15: 16:00:00 via INTRAVENOUS

## 2011-10-15 MED ORDER — NITROGLYCERIN 0.2 MG/ML ON CALL CATH LAB
INTRAVENOUS | Status: AC
  Filled 2011-10-15: qty 1

## 2011-10-15 NOTE — Progress Notes (Signed)
SCD's received and applied to Bil Lower extremity as ordered.  Amanda Pea, Charity fundraiser.

## 2011-10-15 NOTE — H&P (Signed)
     [facility]          INTERVAL PROCEDURE H&P   History and Physical Interval Note:  10/15/2011 8:31 AM  Kathleen [name] has presented today for their planned procedure. The various methods of treatment have been discussed with the patient and family. After consideration of risks, benefits and other options for treatment, the patient has consented to the procedure.  The patients' outpatient history has been reviewed, patient examined, and no change in status from most recent office note within the past 30 days. I have reviewed the patients' chart and labs and will proceed as planned. Questions were answered to the patient's satisfaction.   Chrystie Nose, MD, [facility] Attending Cardiologist [facility]  [name],Kathleen C 10/15/2011, 8:31 AM

## 2011-10-15 NOTE — CV Procedure (Signed)
[facility]     CARDIAC CATHETERIZATION REPORT  Kathleen [name]   [id number] Feb 09, 1924  Performing Cardiologist: Chrystie Nose Primary Physician: Kirk Ruths, MD Primary Cardiologist:  Croitoru  Procedures Performed:  Left Heart Catheterization via 5 Fr right femoral artery access  Right Heart Catheterization via 7 Fr right femoral vein access  Native Coronary Angiography  Indication(s): New cardiomyopathy, pacemaker  History: 78 y.o. female with a history of dizziness s/p PPM placement in 03/2011.  She has had frequent ventricular pacing. She now presents with new dyspnea and heart failure symptoms. LVEF by echocardiogram is 30%. She is referred for LHC/RHC to assess for coronary disease and measure right heart pressures and cardiac output.  Consent: The procedure with Risks/Benefits/Alternatives and Indications was reviewed with the patient (and family).  All questions were answered.    Risks / Complications include, but not limited to: Death, MI, CVA/TIA, VF/VT (with defibrillation), Bradycardia (need for temporary pacer placement), contrast induced nephropathy, bleeding / bruising / hematoma / pseudoaneurysm, vascular or coronary injury (with possible emergent CT or Vascular Surgery), adverse medication reactions, infection.    The patient (and family) voice understanding and agree to proceed.    Risks of procedure as well as the alternatives and risks of each were explained to the (patient/caregiver).  Consent for procedure obtained. Consent for signed by MD and patient with RN witness -- placed on chart.  Procedure: The patient was brought to the 2nd Floor [facility] in the fasting state and prepped and draped in the usual sterile fashion for (Right groin) access.  Sterile technique was used including antiseptics, cap, gloves, gown, hand hygiene, mask and sheet.  Skin prep: Chlorhexidine;  Time Out: Verified  patient identification, verified procedure, site/side was marked, verified correct patient position, special equipment/implants available, medications/allergies/relevent history reviewed, required imaging and test results available.  Performed  The right femoral head was identified using tactile and fluoroscopic technique.  The right groin was anesthetized with 1% subcutaneous Lidocaine.  The right Common Femoral Artery was accessed using the Modified Seldinger Technique with placement of a antimicrobial bonded/coated single lumen (5 Fr) sheath was placed using the Seldinger technique.  The sheath was aspirated and flushed.  The right femoral vein was accessed similarly using a needle/wire technique and a 7 Fr venous sheath was placed and aspirated.  A right heart catheterization was performed with a Swan-Ganz catheter which was advanced into the RA, RV, PA and PCWP positions. Saturations were obtained and cardiac output was measured by Fick and Thermodilution methods.  Simultaneous LV/RV and LV/PA tracings were obtained.  The Swan-Ganz catheter was then removed. A 5 Fr JL4 Catheter was advanced of over a Standard J wire into the ascending Aorta.  The catheter was used to engage the left coronary artery.  Multiple cineangiographic views of the left coronary artery system(s) were performed. A 7 Fr JR4 Catheter was advanced of over a Safety J wire into the ascending Aorta.  The catheter was used to engage the right coronary artery.  Multiple cineangiographic views of the right coronary artery system(s) were performed. This catheter was then exchanged over the Standard J wire for an angled Pigtail catheter that was advanced across the Aortic Valve.  LV hemodynamics were measured and the catheter was pulled back across the Aortic Valve for measurement of "pull-back" gradient.  The catheter and the wire was removed completely out of the body.  The patient was transferred to the holding area  where the sheath was  removed with manual pressure held for hemostasis.   The patient was transported to the cath lab holding area in stable condition.   The patient  was stable before, during and following the procedure.   Patient did tolerate procedure well. There were not complications.  EBL: <10 cc  Medications:  Premedication: 5 mg  Valium  Sedation:  None  Contrast:  40 cc Omnipaque   Hemodynamics:  Central Aortic Pressure / Mean Aortic Pressure: 147/62  LV Pressure / LV End diastolic Pressure:  15  Right Heart Catheterization:  RA - 3  RV - 37/4  PA - 36/11 (23)  PCWP - 15  TPG - 8  PA Sat - 65%  AO Sat - 96% (RA)  TDCO/CI - 2.65/1.47  FCO/CI - 5.12/2.84  PVR - 2.2 wood units  Coronary Angiographic Data:  Left Main: Short left main, angiographically normal  Left Anterior Descending (LAD):  Angiographically normal, normal caliber, extends around the apex  1st diagonal (D1):  Moderate sized vessel, no stenosis  2nd diagonal (D2): Small vessel, no angiographic stenosis.  Circumflex (LCx):  Moderate sized vessel, no stenosis  1st obtuse marginal:  Large vessel with high lateral takeoff, no stenosis.  Ramus Intermedius:  True ramus vessel, moderate sized, that coarses between the LAD and high OM branch. No stenosis.  Right Coronary Artery: Smaller caliber, but dominant. No angiographic stenosis. Ostial speck of calcium, but not stenotic.  posterior descending artery: Normal  posterior lateral branch:  Normal  Impression: 1.  Angiographically normal coronary arteries. 2.  Reduced cardiac output with normal right heart pressures. Normal PA saturation. Normal PVR. 3.  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  Plan: 1.  Discussed with Dr. Royann Shivers, will contact [facility] for evaluation of BI-V pacing upgrade. 2.  Continue heart failure treatment, however, she appears adequately diuresed at this point and can lay flat without difficulty.  The case and results was discussed with  the patient (and family). The case and results was discussed with the patient's PCP. The case and results was discussed with the patient's Cardiologist.  Time Spend Directly with Patient:  60 minutes  Chrystie Nose, MD, [facility] Attending Cardiologist [facility]  Josias Tomerlin C 10/15/2011, 10:20 AM

## 2011-10-16 ENCOUNTER — Encounter (HOSPITAL_COMMUNITY)
Admission: EM | Disposition: A | Discharge status: Home / Self Care | Payer: Self-pay | Source: Home / Self Care | Attending: Cardiovascular Disease

## 2011-10-16 DIAGNOSIS — I5021 Acute systolic (congestive) heart failure: Principal | ICD-10-CM

## 2011-10-16 DIAGNOSIS — I441 Atrioventricular block, second degree: Secondary | ICD-10-CM

## 2011-10-16 HISTORY — PX: PACEMAKER INSERTION: SHX728

## 2011-10-16 LAB — BASIC METABOLIC PANEL
CO2: 28 mEq/L (ref 19–32)
Chloride: 97 mEq/L (ref 96–112)
Creatinine, Ser: 1.54 mg/dL — ABNORMAL HIGH (ref 0.50–1.10)

## 2011-10-16 LAB — GLUCOSE, CAPILLARY
Glucose-Capillary: 241 mg/dL — ABNORMAL HIGH (ref 70–99)
Glucose-Capillary: 106 mg/dL — ABNORMAL HIGH (ref 70–99)
Glucose-Capillary: 171 mg/dL — ABNORMAL HIGH (ref 70–99)

## 2011-10-16 SURGERY — BI-VENTRICULAR PACEMAKER UPGRADE
Anesthesia: LOCAL

## 2011-10-16 MED ORDER — SODIUM CHLORIDE 0.9 % IV SOLN: 250.0000 mL | INTRAVENOUS | Status: DC

## 2011-10-16 MED ORDER — FENTANYL CITRATE 0.05 MG/ML IJ SOLN
INTRAMUSCULAR | Status: AC
  Filled 2011-10-16: qty 2

## 2011-10-16 MED ORDER — HEPARIN (PORCINE) IN NACL 2-0.9 UNIT/ML-% IJ SOLN
INTRAMUSCULAR | Status: AC
  Filled 2011-10-16: qty 500

## 2011-10-16 MED ORDER — ONDANSETRON HCL 4 MG/2ML IJ SOLN: 4.0000 mg | Freq: Four times a day (QID) | INTRAMUSCULAR | Status: DC | PRN

## 2011-10-16 MED ORDER — SODIUM CHLORIDE 0.45 % IV SOLN
INTRAVENOUS | Status: DC
  Administered 2011-10-16: 16:00:00 via INTRAVENOUS

## 2011-10-16 MED ORDER — SODIUM CHLORIDE 0.9 % IR SOLN
Status: DC
  Filled 2011-10-16: qty 2

## 2011-10-16 MED ORDER — SODIUM CHLORIDE 0.9 % IJ SOLN: 3.0000 mL | Freq: Two times a day (BID) | INTRAMUSCULAR | Status: DC

## 2011-10-16 MED ORDER — SODIUM CHLORIDE 0.9 % IJ SOLN: 3.0000 mL | INTRAMUSCULAR | Status: DC | PRN

## 2011-10-16 MED ORDER — CEFAZOLIN SODIUM-DEXTROSE 2-3 GM-% IV SOLR: 2.0000 g | INTRAVENOUS | Status: DC

## 2011-10-16 MED ORDER — SODIUM CHLORIDE 0.9 % IV SOLN
INTRAVENOUS | Status: AC
  Administered 2011-10-16: 11:00:00 via INTRAVENOUS

## 2011-10-16 MED ORDER — LIDOCAINE HCL (PF) 1 % IJ SOLN
INTRAMUSCULAR | Status: AC
  Filled 2011-10-16: qty 60

## 2011-10-16 MED ORDER — CEFAZOLIN SODIUM-DEXTROSE 2-3 GM-% IV SOLR
2.0000 g | INTRAVENOUS | Status: DC
  Filled 2011-10-16 (×2): qty 50

## 2011-10-16 MED ORDER — SODIUM CHLORIDE 0.9 % IR SOLN: 80.0000 mg | Status: DC

## 2011-10-16 MED ORDER — MIDAZOLAM HCL 5 MG/5ML IJ SOLN
INTRAMUSCULAR | Status: AC
  Filled 2011-10-16: qty 5

## 2011-10-16 MED ORDER — ACETAMINOPHEN 325 MG PO TABS: 325.0000 mg | ORAL_TABLET | ORAL | Status: DC | PRN

## 2011-10-16 MED ORDER — CEFAZOLIN SODIUM-DEXTROSE 2-3 GM-% IV SOLR
2.0000 g | Freq: Four times a day (QID) | INTRAVENOUS | Status: AC
  Administered 2011-10-16 – 2011-10-17 (×3): 2 g via INTRAVENOUS
  Filled 2011-10-16 (×3): qty 50

## 2011-10-16 NOTE — Plan of Care (Signed)
Problem: Phase I Progression Outcomes Goal: Post Cath/PCI return to appropriate Path Outcome: Progressing Pt has cath today for placement of LV lead to pacemaker, pt on bedrest until the am with BR pillages, site c/d/i, pt has no c/o pain, pt resting comfortably, will continue to monitor

## 2011-10-16 NOTE — Op Note (Signed)
BiV PPM insertion via the left subclavian without immediate complication. Z#[id number].

## 2011-10-16 NOTE — Consult Note (Signed)
ELECTROPHYSIOLOGY CONSULT NOTE  Patient ID: Kathleen [name] MRN: [id number], DOB/AGE: 07-25-1924   Admit date: 10/11/2011 Date of Consult: 10/16/2011  Primary Physician: Kirk Ruths, MD Primary Cardiologist: Royann Shivers, MD Reason for Consultation: Consideration of BiV PPM upgrade  History of Present Illness Ms. Goffin is an 78 year old woman with PAF and SSS/bradycardia and 2nd degree AVB s/p PPM previously implanted March 2013 ([employer] Revo) who has been admitted with acute systolic HF and new LV dysfunction. She has undergone cardiac catheterization which revealed no significant CAD and it is felt her cardiomyopathy is most likely secondary to chronic RV pacing; therefore, we have been asked to see her in consultation for consideration of BiV PPM upgrade.   Past Medical History Past Medical History  Diagnosis Date  . Hypertension   . Diabetes mellitus   . High cholesterol   . Atrial fibrillation   . Paroxysmal a-fib 12/13/2010  . PNA (pneumonia)   . CAD (coronary artery disease) 2006  . Acid reflux   . AV block, 2nd degree     S/p Pacemaker 03/2011  . CHF (congestive heart failure)   . Shortness of breath   . Pacemaker     Past Surgical History Past Surgical History  Procedure Date  . Replacement total knee   . Bladder tact   . Pacemaker insertion   . Cholecystectomy   . Bladder tack      Allergies/Intolerances Allergies  Allergen Reactions  . Coumadin (Warfarin Sodium) Other (See Comments)    Caused Patient to Bleed Out.   . Meloxicam Other (See Comments)    Unknown  . Morphine Itching    Not in right state of mind.   . Sulfonamide Derivatives Hives    Inpatient Medications . antiseptic oral rinse  15 mL Mouth Rinse BID  . aspirin EC  325 mg Oral Daily  . carvedilol  12.5 mg Oral BID WC  . docusate sodium  100 mg Oral BID  . famotidine  20 mg Oral Daily  . heparin      . insulin aspart  0-5 Units Subcutaneous QHS  . insulin aspart  0-9 Units  Subcutaneous TID WC  . insulin glargine  5 Units Subcutaneous QHS  . levalbuterol  2 puff Inhalation TID  . lidocaine      . nitrofurantoin  50 mg Oral Daily  . nitroGLYCERIN      . simvastatin  40 mg Oral QHS   . sodium chloride 50 mL/hr at 10/15/11 1547  . DISCONTD: sodium chloride 50 mL/hr at 10/14/11 1754   Family History Family History  Problem Relation Age of Onset  . Diabetes Brother   . Heart murmur Daughter      Social History Social History  . Marital Status: Widowed   Social History Main Topics  . Smoking status: Never Smoker   . Smokeless tobacco: Never Used  . Alcohol Use: No  . Drug Use: No  . Sexually Active: Not Currently   Review of Systems General: No chills, fever, night sweats or weight changes  Cardiovascular: No chest pain, palpitations, paroxysmal nocturnal dyspnea Dermatological: No rash, lesions or masses Respiratory: No cough Urologic: No hematuria, dysuria Abdominal: No nausea, vomiting, diarrhea, bright red blood per rectum, melena, or hematemesis Neurologic: No visual changes, weakness, changes in mental status All other systems reviewed and are otherwise negative except as noted above.  Physical Exam Blood pressure 118/63, pulse 73, temperature 98.4 F (36.9 C), temperature source Oral, resp. rate 18,  height 5\' 8"  (1.727 m), weight 150 lb 12.8 oz (68.402 kg), SpO2 100.00%.  General: Well developed, well appearing 78 year old female in no acute distress. HEENT: Normocephalic, atraumatic. EOMs intact. Sclera nonicteric. Oropharynx clear.  Neck: Supple. No JVD. Lungs:  Respirations regular and unlabored, CTA bilaterally. No wheezes, rales or rhonchi. Heart: RRR. S1, S2 present. No murmurs, rub, S3 or S4. Abdomen: Soft, non-tender, non-distended. BS present x 4 quadrants. No hepatosplenomegaly.  Extremities: No clubbing, cyanosis or edema. DP/PT/Radials 2+ and equal bilaterally. Psych: Normal affect. Neuro: Alert and oriented X 3. Moves all  extremities spontaneously. Musculoskeletal: No kyphosis. Skin: Intact. Warm and dry. No rashes or petechiae in exposed areas.   Labs Lab Results  Component Value Date   WBC 6.4 10/15/2011   HGB 11.1* 10/15/2011   HCT 33.1* 10/15/2011   MCV 98.2 10/15/2011   PLT 274 10/15/2011    Lab 10/16/11 0505 10/11/11 0955  NA 132* --  K 4.7 --  CL 97 --  CO2 28 --  BUN 15 --  CREATININE 1.54* --  CALCIUM 9.2 --  PROT -- 6.9  BILITOT -- 0.9  ALKPHOS -- 75  ALT -- 15  AST -- 27  GLUCOSE 110* --    ProBNP 5551 Troponin x3   Radiology/Studies Dg Chest Port 1 View 10/11/2011  *RADIOLOGY REPORT*  Clinical Data: Chest pain, shortness of breath, CHF  PORTABLE CHEST - 1 VIEW  Comparison: 03/25/2011  Findings: Chronic interstitial markings/emphysematous changes.  Patchy/interstitial opacities in the left upper lobe, right perihilar region, and right lower lobe, favored to reflect superimposed interstitial edema, less likely multifocal infection.  No definite pleural effusion.  No pneumothorax.  The heart is normal in size of subclavian pacemaker.  IMPRESSION: Multifocal patchy/interstitial opacities, favored to reflect interstitial edema, likely multifocal infection.  Underlying chronic interstitial markings/emphysematous changes.   Original Report Authenticated By: Charline Bills, M.D.    Echocardiogram  - Left ventricle: The cavity size was mildly dilated. There was mild concentric hypertrophy. Systolic function was moderately reduced. The estimated ejection fraction was in the range of 35% to 40%. Diffuse hypokinesis. Severe hypokinesis of the inferior and apical myocardium. Features are consistent with a pseudonormal left ventricular filling pattern, with concomitant abnormal relaxation and increased filling pressure (grade 2 diastolic dysfunction). Doppler parameters are consistent with elevated mean left atrial filling pressure. - Ventricular septum: Septal motion showed abnormal function and  dyssynergy. These changes are consistent with right ventricular pacing. - Mitral valve: Mild regurgitation directed centrally. - Pulmonary arteries: Systolic pressure was mildly increased. PA peak pressure: 46mm Hg (S).  Telemetry shows V paced at 63 bpm  Cardiac catheterization 10/15/2011 Hemodynamics:  Central Aortic Pressure / Mean Aortic Pressure: 147/62  LV Pressure / LV End diastolic Pressure: 15  Right Heart Catheterization:  RA - 3  RV - 37/4  PA - 36/11 (23)  PCWP - 15  TPG - 8  PA Sat - 65%  AO Sat - 96% (RA)  TDCO/CI - 2.65/1.47  FCO/CI - 5.12/2.84  PVR - 2.2 wood units  Coronary Angiographic Data:  Left Main: Short left main, angiographically normal  Left Anterior Descending (LAD): Angiographically normal, normal caliber, extends around the apex  1st diagonal (D1): Moderate sized vessel, no stenosis  2nd diagonal (D2): Small vessel, no angiographic stenosis.  Circumflex (LCx): Moderate sized vessel, no stenosis  1st obtuse marginal: Large vessel with high lateral takeoff, no stenosis.  Ramus Intermedius: True ramus vessel, moderate sized, that coarses between  the LAD and high OM branch. No stenosis.  Right Coronary Artery: Smaller caliber, but dominant. No angiographic stenosis. Ostial speck of calcium, but not stenotic.  posterior descending artery: Normal  posterior lateral branch: Normal Impression:  1. Angiographically normal coronary arteries.  2. Reduced cardiac output with normal right heart pressures. Normal PA saturation. Normal PVR.  3. 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    Assessment and Plan 1. Acute systolic HF with new LV dysfunction and abnormal ventricular septal wall motion showing dyssynchrony with RV pacing Ms. Kromer will likely benefit from BiV PPM upgrade for CRT. The indications and procedure were discussed in detail with Ms. Ellerman and her son today. Dr. Ladona Ridgel will see and provide further recommendations.  Signed, Rick Duff, PA-C 10/16/2011, 9:09 AM  EP Attending  Patient seen and examined. Agree with the above exam, assessment and plan as noted above. She has worsening CHF symptoms despite medical therapy and a pacing induced LBBB. I have discussed the risks/benefits/goals/expectations of BiV PPM upgrade with the patient and she wishes to proceed.  Lewayne Bunting, M.D.

## 2011-10-16 NOTE — Progress Notes (Addendum)
[facility]  Subjective: No Complaints.  Objective: Vital signs in last 24 hours: Temp:  [97.7 F (36.5 C)-98.4 F (36.9 C)] 98.4 F (36.9 C) (10/03 0519) Pulse Rate:  [62-73] 73  (10/03 0519) Resp:  [18] 18  (10/03 0519) BP: (118-155)/(53-87) 118/63 mmHg (10/03 0519) SpO2:  [94 %-100 %] 96 % (10/03 0929) Weight:  [68.402 kg (150 lb 12.8 oz)] 68.402 kg (150 lb 12.8 oz) (10/03 0519) Last BM Date: 10/13/11  Intake/Output from previous day: 10/02 0701 - 10/03 0700 In: 540 [P.O.:540] Out: 1290 [Urine:1290] Intake/Output this shift: Total I/O In: 240 [P.O.:240] Out: 200 [Urine:200]  Medications Current Facility-Administered Medications  Medication Dose Route Frequency Provider Last Rate Last Dose  . 0.9 %  sodium chloride infusion   Intravenous Continuous Chrystie Nose, MD 50 mL/hr at 10/15/11 1547    . acetaminophen (TYLENOL) tablet 650 mg  650 mg Oral Q4H PRN Chrystie Nose, MD      . alum & mag hydroxide-simeth (MAALOX/MYLANTA) 200-200-20 MG/5ML suspension 30 mL  30 mL Oral Q6H PRN Elliot Cousin, MD      . antiseptic oral rinse (BIOTENE) solution 15 mL  15 mL Mouth Rinse BID Elliot Cousin, MD   15 mL at 10/16/11 0800  . aspirin EC tablet 325 mg  325 mg Oral Daily Elliot Cousin, MD   325 mg at 10/16/11 9604  . carvedilol (COREG) tablet 12.5 mg  12.5 mg Oral BID WC Elliot Cousin, MD   12.5 mg at 10/16/11 0807  . docusate sodium (COLACE) capsule 100 mg  100 mg Oral BID Elliot Cousin, MD   100 mg at 10/15/11 2110  . famotidine (PEPCID) tablet 20 mg  20 mg Oral Daily Elliot Cousin, MD   20 mg at 10/15/11 1147  . insulin aspart (novoLOG) injection 0-5 Units  0-5 Units Subcutaneous QHS Elliot Cousin, MD   2 Units at 10/15/11 2110  . insulin aspart (novoLOG) injection 0-9 Units  0-9 Units Subcutaneous TID WC Elliot Cousin, MD   1 Units at 10/15/11 1701  . insulin glargine (LANTUS) injection 5 Units  5 Units Subcutaneous QHS Elliot Cousin, MD   5  Units at 10/15/11 2110  . levalbuterol (XOPENEX HFA) inhaler 2 puff  2 puff Inhalation TID Thurmon Fair, MD   2 puff at 10/16/11 0926  . levalbuterol (XOPENEX HFA) inhaler 2 puff  2 puff Inhalation Q4H PRN Mihai Croitoru, MD      . levalbuterol (XOPENEX) nebulizer solution 0.63 mg  0.63 mg Nebulization Q4H PRN Thurmon Fair, MD   0.63 mg at 10/14/11 2122  . nitrofurantoin (MACRODANTIN) capsule 50 mg  50 mg Oral Daily Elliot Cousin, MD   50 mg at 10/15/11 1400  . ondansetron (ZOFRAN) tablet 4 mg  4 mg Oral Q6H PRN Elliot Cousin, MD   4 mg at 10/12/11 5409   Or  . ondansetron (ZOFRAN) injection 4 mg  4 mg Intravenous Q6H PRN Elliot Cousin, MD      . oxyCODONE (Oxy IR/ROXICODONE) immediate release tablet 5 mg  5 mg Oral Q4H PRN Elliot Cousin, MD      . simvastatin (ZOCOR) tablet 40 mg  40 mg Oral QHS Elliot Cousin, MD   40 mg at 10/15/11 2110  . traZODone (DESYREL) tablet 50 mg  50 mg Oral QHS PRN Christiane Ha, MD   50 mg at 10/14/11 0010  . DISCONTD: 0.45 % sodium chloride infusion   Intravenous Continuous Abelino Derrick,  PA 50 mL/hr at 10/14/11 1754    . DISCONTD: 0.9 %  sodium chloride infusion  250 mL Intravenous PRN Abelino Derrick, PA      . DISCONTD: acetaminophen (TYLENOL) suppository 650 mg  650 mg Rectal Q6H PRN Elliot Cousin, MD      . DISCONTD: acetaminophen (TYLENOL) tablet 650 mg  650 mg Oral Q6H PRN Elliot Cousin, MD      . DISCONTD: ondansetron (ZOFRAN) injection 4 mg  4 mg Intravenous Q6H PRN Chrystie Nose, MD      . DISCONTD: sodium chloride 0.9 % injection 3 mL  3 mL Intravenous PRN Abelino Derrick, PA        PE: General appearance: alert, cooperative and no distress Lungs: clear to auscultation bilaterally Heart: regular rate and rhythm, S1, S2 normal, no murmur, click, rub or gallop Extremities: No LEE Pulses: 2+ and symmetric Skin: Warm and dry Neurologic: Grossly normal  Lab Results:   Basename 10/15/11 0610  WBC 6.4  HGB 11.1*  HCT 33.1*  PLT 274    BMET  Basename 10/16/11 0505 10/15/11 0610 10/14/11 0509  NA 132* 136 136  K 4.7 4.1 4.7  CL 97 98 99  CO2 28 29 28   GLUCOSE 110* 120* 124*  BUN 15 13 11   CREATININE 1.54* 1.48* 1.44*  CALCIUM 9.2 8.8 8.9   PT/INR  Basename 10/15/11 0610  LABPROT 15.0  INR 1.20   Procedures Performed:  Left Heart Catheterization via 5 Fr right femoral artery access  Right Heart Catheterization via 7 Fr right femoral vein access  Native Coronary Angiography Indication(s): New cardiomyopathy, pacemaker  History: 78 y.o. female with a history of dizziness s/p PPM placement in 03/2011. She has had frequent ventricular pacing. She now presents with new dyspnea and heart failure symptoms. LVEF by echocardiogram is 30%. She is referred for LHC/RHC to assess for coronary disease and measure right heart pressures and cardiac output.  Consent: The procedure with Risks/Benefits/Alternatives and Indications was reviewed with the patient (and family). All questions were answered.  Risks / Complications include, but not limited to: Death, MI, CVA/TIA, VF/VT (with defibrillation), Bradycardia (need for temporary pacer placement), contrast induced nephropathy, bleeding / bruising / hematoma / pseudoaneurysm, vascular or coronary injury (with possible emergent CT or Vascular Surgery), adverse medication reactions, infection.  The patient (and family) voice understanding and agree to proceed.  Risks of procedure as well as the alternatives and risks of each were explained to the (patient/caregiver). Consent for procedure obtained.  Consent for signed by MD and patient with RN witness -- placed on chart.  Procedure: The patient was brought to the 2nd Floor [facility] in the fasting state and prepped and draped in the usual sterile fashion for (Right groin) access.  Sterile technique was used including antiseptics, cap, gloves, gown, hand hygiene, mask and sheet.  Skin prep: Chlorhexidine;   Time Out: Verified patient identification, verified procedure, site/side was marked, verified correct patient position, special equipment/implants available, medications/allergies/relevent history reviewed, required imaging and test results available. Performed  The right femoral head was identified using tactile and fluoroscopic technique. The right groin was anesthetized with 1% subcutaneous Lidocaine. The right Common Femoral Artery was accessed using the Modified Seldinger Technique with placement of a antimicrobial bonded/coated single lumen (5 Fr) sheath was placed using the Seldinger technique. The sheath was aspirated and flushed. The right femoral vein was accessed similarly using a needle/wire technique and a 7 Fr venous sheath  was placed and aspirated. A right heart catheterization was performed with a Swan-Ganz catheter which was advanced into the RA, RV, PA and PCWP positions. Saturations were obtained and cardiac output was measured by Fick and Thermodilution methods. Simultaneous LV/RV and LV/PA tracings were obtained. The Swan-Ganz catheter was then removed. A 5 Fr JL4 Catheter was advanced of over a Standard J wire into the ascending Aorta. The catheter was used to engage the left coronary artery. Multiple cineangiographic views of the left coronary artery system(s) were performed. A 7 Fr JR4 Catheter was advanced of over a Safety J wire into the ascending Aorta. The catheter was used to engage the right coronary artery. Multiple cineangiographic views of the right coronary artery system(s) were performed. This catheter was then exchanged over the Standard J wire for an angled Pigtail catheter that was advanced across the Aortic Valve. LV hemodynamics were measured and the catheter was pulled back across the Aortic Valve for measurement of "pull-back" gradient. The catheter and the wire was removed completely out of the body.  The patient was transferred to the holding area where the sheath was  removed with manual pressure held for hemostasis.  The patient was transported to the cath lab holding area in stable condition.  The patient was stable before, during and following the procedure.  Patient did tolerate procedure well.  There were not complications.  EBL: <10 cc  Medications:  Premedication: 5 mg Valium  Sedation: None  Contrast: 40 cc Omnipaque  Hemodynamics:  Central Aortic Pressure / Mean Aortic Pressure: 147/62  LV Pressure / LV End diastolic Pressure: 15  Right Heart Catheterization:  RA - 3  RV - 37/4  PA - 36/11 (23)  PCWP - 15  TPG - 8  PA Sat - 65%  AO Sat - 96% (RA)  TDCO/CI - 2.65/1.47  FCO/CI - 5.12/2.84  PVR - 2.2 wood units  Coronary Angiographic Data:  Left Main: Short left main, angiographically normal  Left Anterior Descending (LAD): Angiographically normal, normal caliber, extends around the apex  1st diagonal (D1): Moderate sized vessel, no stenosis  2nd diagonal (D2): Small vessel, no angiographic stenosis.  Circumflex (LCx): Moderate sized vessel, no stenosis  1st obtuse marginal: Large vessel with high lateral takeoff, no stenosis.  Ramus Intermedius: True ramus vessel, moderate sized, that coarses between the LAD and high OM branch. No stenosis.  Right Coronary Artery: Smaller caliber, but dominant. No angiographic stenosis. Ostial speck of calcium, but not stenotic.  posterior descending artery: Normal  posterior lateral branch: Normal Impression:  1. Angiographically normal coronary arteries.  2. Reduced cardiac output with normal right heart pressures. Normal PA saturation. Normal PVR.  3. 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  Plan:  1. Discussed with Dr. Royann Shivers, will contact [place] EP for evaluation of BI-V pacing upgrade.  2. Continue heart failure treatment, however, she appears adequately diuresed at this point and can lay flat without difficulty.  The case and results was discussed with the patient (and family).  The  case and results was discussed with the patient's PCP.  The case and results was discussed with the patient's Cardiologist.  Time Spend Directly with Patient:  60 minutes  Chrystie Nose, MD, [facility]  Attending Cardiologist  [facility]  [name],[name]  10/15/2011, 10:20 AM    Assessment/Plan  Principal Problem:  *Acute systolic CHF (congestive heart failure) Active Problems:  Hypertension  DM type 2 (diabetes mellitus, type 2)  Paroxysmal a-fib, refuses Coumadin  CAD, moderate by cath 2006, 50% RCA  Acute renal insufficiency, Scr was WNL in April 2013  Anemia  Unstable angina  Cardiomyopathy, New EF 30% with WMA, r/o CAD progression  S/P placement of cardiac pacemaker, March 2013  Plan:  Cardiomyopathy.  EF 30%. S/P right and left heart cath 10/15/11.  Normal coronaries.   V-pacing on tele.  BP stable.  SCr is creeping up and sodium decreased.  EP consulted for BiV upgrade.  Will give some IV fluid today. 71ml/hr over the next eight hours.     LOS: 5 days    HAGER, BRYAN 10/16/2011 10:10 AM  I have seen & examined the patient & reviewed th chart today.  I agree with the findings, examination & recommendation above per Mr. Leron Croak, [place].  RHC results indicated adequate diuresis & increasing Cr suggests perhaps overdiuresis.  Will not "hydrate" but will simply hold diuretics. LHC with non-obstructive CAD suggesting PPM mediated LV dysfunction -- EP has graciously initiated consult with plan for upgrade to BiVPM, Dr. Lubertha Basque addendum pending.  Anticipate upgrade tomorrow & d/c Saturday.    BP & HR stable on current meds  -- BB.  Not yet on ACE-I/ARB (will want to see Creatinine stabilize before starting).  Nitrofurantoin was listed as OP medication -- for long-term suppressive Rx.  With Cr increasing, would consider stopping temporarily until renal fxn recovers.  On Lantus & SSI for SM; Statin for HLD.  Marykay Lex, M.D., M.S. [facility] [address] [place], [place]  [address]  [phone] Pager # [phone]  10/16/2011 1:44 PM

## 2011-10-17 ENCOUNTER — Encounter: Payer: Self-pay | Admitting: *Deleted

## 2011-10-17 ENCOUNTER — Inpatient Hospital Stay (HOSPITAL_COMMUNITY): Payer: Medicare Other

## 2011-10-17 DIAGNOSIS — I441 Atrioventricular block, second degree: Secondary | ICD-10-CM

## 2011-10-17 LAB — BASIC METABOLIC PANEL
CO2: 27 mEq/L (ref 19–32)
GFR calc non Af Amer: 33 mL/min — ABNORMAL LOW (ref >90)
Glucose, Bld: 121 mg/dL — ABNORMAL HIGH (ref 70–99)
Potassium: 3.8 mEq/L (ref 3.5–5.1)
Sodium: 134 mEq/L — ABNORMAL LOW (ref 135–145)

## 2011-10-17 LAB — GLUCOSE, CAPILLARY: Glucose-Capillary: 142 mg/dL — ABNORMAL HIGH (ref 70–99)

## 2011-10-17 MED ORDER — METFORMIN HCL 500 MG PO TABS: 500.0000 mg | ORAL_TABLET | Freq: Two times a day (BID) | ORAL | Status: DC

## 2011-10-17 NOTE — Progress Notes (Signed)
[facility]  DAILY PROGRESS NOTE   Subjective:  No events noted overnight. Successful Bi-V pacer upgrade yesterday by Dr. Ladona Ridgel.   Objective:  Temp:  [98 F (36.7 C)-98.9 F (37.2 C)] 98.4 F (36.9 C) (10/04 0645) Pulse Rate:  [60-70] 60  (10/04 0645) Resp:  [18-20] 18  (10/04 0645) BP: (119-154)/(53-73) 154/63 mmHg (10/04 0645) SpO2:  [94 %-98 %] 98 % (10/04 0645) Weight:  [69.31 kg (152 lb 12.8 oz)] 69.31 kg (152 lb 12.8 oz) (10/04 0645) Weight change: 0.907 kg (2 lb)  Intake/Output from previous day: 10/03 0701 - 10/04 0700 In: 520 [P.O.:420; IV Piggyback:100] Out: 850 [Urine:850]  Intake/Output from this shift:    Medications: Current Facility-Administered Medications  Medication Dose Route Frequency Provider Last Rate Last Dose  . 0.9 %  sodium chloride infusion   Intravenous Continuous Wilburt Finlay, PA 50 mL/hr at 10/16/11 1052    . acetaminophen (TYLENOL) tablet 325-650 mg  325-650 mg Oral Q4H PRN Marinus Maw, MD      . alum & mag hydroxide-simeth (MAALOX/MYLANTA) 200-200-20 MG/5ML suspension 30 mL  30 mL Oral Q6H PRN Elliot Cousin, MD      . antiseptic oral rinse (BIOTENE) solution 15 mL  15 mL Mouth Rinse BID Elliot Cousin, MD   15 mL at 10/17/11 0800  . aspirin EC tablet 325 mg  325 mg Oral Daily Elliot Cousin, MD   325 mg at 10/17/11 0725  . carvedilol (COREG) tablet 12.5 mg  12.5 mg Oral BID WC Elliot Cousin, MD   12.5 mg at 10/17/11 0725  . ceFAZolin (ANCEF) IVPB 2 g/50 mL premix  2 g Intravenous To Cath Mihai Croitoru, MD      . ceFAZolin (ANCEF) IVPB 2 g/50 mL premix  2 g Intravenous Q6H Marinus Maw, MD   2 g at 10/17/11 0630  . docusate sodium (COLACE) capsule 100 mg  100 mg Oral BID Elliot Cousin, MD   100 mg at 10/16/11 2222  . famotidine (PEPCID) tablet 20 mg  20 mg Oral Daily Elliot Cousin, MD   20 mg at 10/16/11 1051  . fentaNYL (SUBLIMAZE) 0.05 MG/ML injection           . gentamicin (GARAMYCIN) 80 mg in sodium chloride  irrigation 0.9 % 500 mL irrigation   Irrigation To Cath Mihai Croitoru, MD      . heparin 2-0.9 UNIT/ML-% infusion           . insulin aspart (novoLOG) injection 0-5 Units  0-5 Units Subcutaneous QHS Elliot Cousin, MD   2 Units at 10/15/11 2110  . insulin aspart (novoLOG) injection 0-9 Units  0-9 Units Subcutaneous TID WC Elliot Cousin, MD   1 Units at 10/17/11 0703  . insulin glargine (LANTUS) injection 5 Units  5 Units Subcutaneous QHS Elliot Cousin, MD   5 Units at 10/16/11 2239  . levalbuterol (XOPENEX HFA) inhaler 2 puff  2 puff Inhalation TID Thurmon Fair, MD   2 puff at 10/16/11 1957  . levalbuterol (XOPENEX HFA) inhaler 2 puff  2 puff Inhalation Q4H PRN Mihai Croitoru, MD      . levalbuterol (XOPENEX) nebulizer solution 0.63 mg  0.63 mg Nebulization Q4H PRN Thurmon Fair, MD   0.63 mg at 10/14/11 2122  . lidocaine (XYLOCAINE) 1 % injection           . midazolam (VERSED) 5 MG/5ML injection           . nitrofurantoin (MACRODANTIN)  capsule 50 mg  50 mg Oral Daily Elliot Cousin, MD   50 mg at 10/16/11 1051  . ondansetron (ZOFRAN) tablet 4 mg  4 mg Oral Q6H PRN Elliot Cousin, MD   4 mg at 10/12/11 4010   Or  . ondansetron (ZOFRAN) injection 4 mg  4 mg Intravenous Q6H PRN Elliot Cousin, MD      . oxyCODONE (Oxy IR/ROXICODONE) immediate release tablet 5 mg  5 mg Oral Q4H PRN Elliot Cousin, MD      . simvastatin (ZOCOR) tablet 40 mg  40 mg Oral QHS Elliot Cousin, MD   40 mg at 10/16/11 2222  . traZODone (DESYREL) tablet 50 mg  50 mg Oral QHS PRN Christiane Ha, MD   50 mg at 10/14/11 0010  . DISCONTD: 0.45 % sodium chloride infusion   Intravenous Continuous Herby Abraham Edmisten, PA-C 50 mL/hr at 10/16/11 1608    . DISCONTD: 0.9 %  sodium chloride infusion   Intravenous Continuous Chrystie Nose, MD 50 mL/hr at 10/15/11 1547    . DISCONTD: 0.9 %  sodium chloride infusion  250 mL Intravenous Continuous Brooke O Edmisten, PA-C      . DISCONTD: acetaminophen (TYLENOL) tablet 650 mg  650 mg Oral  Q4H PRN Chrystie Nose, MD      . DISCONTD: ceFAZolin (ANCEF) IVPB 2 g/50 mL premix  2 g Intravenous On Call [employer], PA-C      . DISCONTD: gentamicin (GARAMYCIN) 80 mg in sodium chloride irrigation 0.9 % 500 mL irrigation  80 mg Irrigation On Call [employer], PA-C      . DISCONTD: ondansetron (ZOFRAN) injection 4 mg  4 mg Intravenous Q6H PRN Marinus Maw, MD      . DISCONTD: sodium chloride 0.9 % injection 3 mL  3 mL Intravenous Q12H Brooke O Edmisten, PA-C      . DISCONTD: sodium chloride 0.9 % injection 3 mL  3 mL Intravenous PRN Minda Meo, PA-C        Physical Exam: General appearance: alert and no distress Neck: no adenopathy, no carotid bruit, no JVD, supple, symmetrical, trachea midline and thyroid not enlarged, symmetric, no tenderness/mass/nodules Lungs: clear to auscultation bilaterally Heart: regular rate and rhythm, S1, S2 normal, no murmur, click, rub or gallop Abdomen: soft, non-tender; bowel sounds normal; no masses,  no organomegaly Extremities: extremities normal, atraumatic, no cyanosis or edema, pacer site is without hematoma or pus Pulses: 2+ and symmetric  Lab Results: Results for orders placed during the hospital encounter of 10/11/11 (from the past 48 hour(s))  POCT I-STAT 3, BLOOD GAS (G3+)     Status: Abnormal   Collection Time   10/15/11  9:33 AM      Component Value Range Comment   pH, Arterial 7.445  7.350 - 7.450    pCO2 arterial 37.1  35.0 - 45.0 mmHg    pO2, Arterial 64.0 (*) 80.0 - 100.0 mmHg    Bicarbonate 25.5 (*) 20.0 - 24.0 mEq/L    TCO2 27  0 - 100 mmol/L    O2 Saturation 93.0      Acid-Base Excess 1.0  0.0 - 2.0 mmol/L    Sample type ARTERIAL     POCT I-STAT 3, BLOOD GAS (G3P V)     Status: Abnormal   Collection Time   10/15/11  9:49 AM      Component Value Range Comment   pH, Ven 7.416 (*) 7.250 - 7.300  pCO2, Ven 44.4 (*) 45.0 - 50.0 mmHg    pO2, Ven 33.0  30.0 - 45.0 mmHg    Bicarbonate 28.5 (*) 20.0 - 24.0  mEq/L    TCO2 30  0 - 100 mmol/L    O2 Saturation 65.0      Acid-Base Excess 3.0 (*) 0.0 - 2.0 mmol/L    Sample type VENOUS      Comment NOTIFIED PHYSICIAN     GLUCOSE, CAPILLARY     Status: Abnormal   Collection Time   10/15/11 10:22 AM      Component Value Range Comment   Glucose-Capillary 131 (*) 70 - 99 mg/dL   GLUCOSE, CAPILLARY     Status: Abnormal   Collection Time   10/15/11 11:36 AM      Component Value Range Comment   Glucose-Capillary 109 (*) 70 - 99 mg/dL    Comment 1 Notify RN     GLUCOSE, CAPILLARY     Status: Abnormal   Collection Time   10/15/11  4:54 PM      Component Value Range Comment   Glucose-Capillary 144 (*) 70 - 99 mg/dL    Comment 1 Notify RN     GLUCOSE, CAPILLARY     Status: Abnormal   Collection Time   10/15/11  8:33 PM      Component Value Range Comment   Glucose-Capillary 241 (*) 70 - 99 mg/dL    Comment 1 Notify RN      Comment 2 Documented in Chart     BASIC METABOLIC PANEL     Status: Abnormal   Collection Time   10/16/11  5:05 AM      Component Value Range Comment   Sodium 132 (*) 135 - 145 mEq/L    Potassium 4.7  3.5 - 5.1 mEq/L    Chloride 97  96 - 112 mEq/L    CO2 28  19 - 32 mEq/L    Glucose, Bld 110 (*) 70 - 99 mg/dL    BUN 15  6 - 23 mg/dL    Creatinine, Ser 1.61 (*) 0.50 - 1.10 mg/dL    Calcium 9.2  8.4 - 09.6 mg/dL    GFR calc non Af Amer 30 (*) >90 mL/min    GFR calc Af Amer 35 (*) >90 mL/min   GLUCOSE, CAPILLARY     Status: Abnormal   Collection Time   10/16/11  6:05 AM      Component Value Range Comment   Glucose-Capillary 106 (*) 70 - 99 mg/dL   GLUCOSE, CAPILLARY     Status: Abnormal   Collection Time   10/16/11 11:29 AM      Component Value Range Comment   Glucose-Capillary 171 (*) 70 - 99 mg/dL    Comment 1 Notify RN     GLUCOSE, CAPILLARY     Status: Abnormal   Collection Time   10/16/11  4:05 PM      Component Value Range Comment   Glucose-Capillary 106 (*) 70 - 99 mg/dL    Comment 1 Notify RN     GLUCOSE,  CAPILLARY     Status: Abnormal   Collection Time   10/16/11  8:35 PM      Component Value Range Comment   Glucose-Capillary 108 (*) 70 - 99 mg/dL   GLUCOSE, CAPILLARY     Status: Abnormal   Collection Time   10/16/11 10:37 PM      Component Value Range Comment   Glucose-Capillary 176 (*) 70 -  99 mg/dL   GLUCOSE, CAPILLARY     Status: Abnormal   Collection Time   10/17/11  6:22 AM      Component Value Range Comment   Glucose-Capillary 142 (*) 70 - 99 mg/dL     Imaging: No results found.  Assessment:  1. Principal Problem: 2.  *Acute systolic CHF (congestive heart failure) 3. Active Problems: 4.  Hypertension 5.  DM type 2 (diabetes mellitus, type 2) 6.  Paroxysmal a-fib, refuses Coumadin 7.  CAD, moderate by cath 2006, 50% RCA 8.  Acute renal insufficiency, Scr was WNL in April 2013 9.  Anemia 10.  Unstable angina 11.  Cardiomyopathy, New EF 30% with WMA, r/o CAD progression 12.  S/P placement of cardiac pacemaker, March 2013 13.   Plan:  1. Successful Bi-V pacer upgrade yesterday. Thanks to [employer] EP. Awaiting CXR. Device interrogation today shows good lead impedances. Short V-V delay and QRS has already narrowed. She feels better. Plan probably discharge home this afternoon. Follow-up with Dr. Royann Shivers. Hold on ACE-I due to slightly rising creatinine as well as diuretic until seen as an outpatient.   Time Spent Directly with Patient:  15 minutes  Length of Stay:  LOS: 6 days   Chrystie Nose, MD, [facility] Attending Cardiologist [facility]  [name],[name] 10/17/2011, 8:01 AM

## 2011-10-17 NOTE — Discharge Summary (Signed)
Physician Discharge Summary  Patient ID: Kathleen [name] MRN: [id number] DOB/AGE: [birth year] 78 y.o.  Admit date: 10/11/2011 Discharge date: 10/17/2011  Admission Diagnoses:  Acute systolic CHF  Discharge Diagnoses:  Principal Problem:  *Acute systolic CHF (congestive heart failure) Active Problems:  Hypertension  DM type 2 (diabetes mellitus, type 2)  Paroxysmal a-fib, refuses Coumadin  CAD, moderate by cath 2006, 50% RCA  Acute renal insufficiency, Scr was WNL in April 2013  Anemia  Unstable angina  Cardiomyopathy, New EF 30% with WMA, r/o CAD progression  PPM-[employer]   Discharged Condition: stable  Hospital Course:   The patient is an 78 y.o. female with a past medical history significant for minor CAD in 2006 with a 50% RCA. She had good LVF on her last assessment. She has had PAF/SSS/AVB and had a pacemaker implanted in March 2013. She was admitted to [facility] 10/11/11 with dyspnea. Workup suggested acute systolic CHF with a new WMA on echo and an EF of 30%. She also had SSCP worrisome for [place]. She is transferred now to [facility] for cath to r/o progression of CAD.   Right and left heart cath revealed normal coronary arteries and normal right heart pressures.  EP was consulted for pacer induced CM and BiV upgrade.  The upgrade was completed on 10/3 without complications.  No pneumothorax on CXR and improved aeration.  Device information was not available at time of DC.  Op note was dictated into old system.  The patient will follow up for wound check at [facility].   OP considerations:    Print OP note for device details  Restart Cozaar 100mg  daily.  Out patient BMET ordered one day prior to appt. To check SCr.  Consults: EP  Significant Diagnostic Studies:  10/17/11, CHEST - 2 VIEW  Comparison: Portable chest x-ray of 10/11/2011  Findings: The lungs are better aerated and there has been  improvement in probable mild pulmonary vascular congestion noted  previously. There may be tiny  effusions present. A new pacemaker  battery pack is present with a new lead present as well. Two prior  pacer leads remain. The bones are osteopenic.  IMPRESSION:  Improved aeration with improvement in probable mild edema.   Right and left heart caths.  Hemodynamics:  Central Aortic Pressure / Mean Aortic Pressure: 147/62  LV Pressure / LV End diastolic Pressure: 15  Right Heart Catheterization:  RA - 3  RV - 37/4  PA - 36/11 (23)  PCWP - 15  TPG - 8  PA Sat - 65%  AO Sat - 96% (RA)  TDCO/CI - 2.65/1.47  FCO/CI - 5.12/2.84  PVR - 2.2 wood units  Coronary Angiographic Data:  Left Main: Short left main, angiographically normal  Left Anterior Descending (LAD): Angiographically normal, normal caliber, extends around the apex  1st diagonal (D1): Moderate sized vessel, no stenosis  2nd diagonal (D2): Small vessel, no angiographic stenosis.  Circumflex (LCx): Moderate sized vessel, no stenosis  1st obtuse marginal: Large vessel with high lateral takeoff, no stenosis.  Ramus Intermedius: True ramus vessel, moderate sized, that coarses between the LAD and high OM branch. No stenosis.  Right Coronary Artery: Smaller caliber, but dominant. No angiographic stenosis. Ostial speck of calcium, but not stenotic.  posterior descending artery: Normal  posterior lateral branch: Normal Impression:  1. Angiographically normal coronary arteries.  2. Reduced cardiac output with normal right heart pressures. Normal PA saturation. Normal PVR.  3. 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  Plan:  1. Discussed with Dr.  Croitoru, will contact [facility] for evaluation of BI-V pacing upgrade.  2. Continue heart failure treatment, however, she appears adequately diuresed at this point and can lay flat without difficulty.  The case and results was discussed with the patient (and family).  The case and results was discussed with the patient's PCP.  The case and results was discussed with the patient's  Cardiologist.  Time Spend Directly with Patient:  60 minutes  Chrystie Nose, MD, [facility]  Attending Cardiologist  [facility]  [name],[name]  10/15/2011, 10:20 AM  BMET    Component Value Date/Time   NA 134* 10/17/2011 0705   K 3.8 10/17/2011 0705   CL 98 10/17/2011 0705   CO2 27 10/17/2011 0705   GLUCOSE 121* 10/17/2011 0705   BUN 15 10/17/2011 0705   CREATININE 1.43* 10/17/2011 0705   CALCIUM 9.4 10/17/2011 0705   GFRNONAA 33* 10/17/2011 0705   GFRAA 38* 10/17/2011 0705   CBC    Component Value Date/Time   WBC 6.4 10/15/2011 0610   RBC 3.37* 10/15/2011 0610   HGB 11.1* 10/15/2011 0610   HCT 33.1* 10/15/2011 0610   PLT 274 10/15/2011 0610   MCV 98.2 10/15/2011 0610   MCH 32.9 10/15/2011 0610   MCHC 33.5 10/15/2011 0610   RDW 13.8 10/15/2011 0610   LYMPHSABS 1.2 05/13/2011 0723   MONOABS 0.6 05/13/2011 0723   EOSABS 0.2 05/13/2011 0723   BASOSABS 0.0 05/13/2011 0723   Treatments: BiV PPM upgrade  Discharge Exam: Blood pressure 154/63, pulse 60, temperature 98.4 F (36.9 C), temperature source Oral, resp. rate 18, height 5\' 8"  (1.727 m), weight 69.31 kg (152 lb 12.8 oz), SpO2 98.00%.   Disposition: 01-Home or Self Care      Discharge Orders    Future Orders Please Complete By Expires   Diet - low sodium heart healthy      Increase activity slowly      Call MD for:  redness, tenderness, or signs of infection (pain, swelling, redness, odor or green/yellow discharge around incision site)          Medication List     As of 10/17/2011 10:37 AM    STOP taking these medications         acetaminophen 325 MG tablet   Commonly known as: TYLENOL      amLODipine 5 MG tablet   Commonly known as: NORVASC      hydrochlorothiazide 12.5 MG capsule   Commonly known as: MICROZIDE      losartan 50 MG tablet   Commonly known as: COZAAR      TAKE these medications         albuterol (2.5 MG/3ML) 0.083% nebulizer solution   Commonly known as: PROVENTIL    Take 2.5 mg by nebulization every 6 (six) hours as needed. Congestion      alendronate 35 MG tablet   Commonly known as: FOSAMAX   Take 35 mg by mouth every 7 (seven) days. Take with a full glass of water on an empty stomach.patient takes on Sunday      aspirin EC 325 MG tablet   Take 325 mg by mouth daily.      carvedilol 12.5 MG tablet   Commonly known as: COREG   Take 12.5 mg by mouth 2 (two) times daily with a meal.      CENTRUM SILVER PO   Take 1 tablet by mouth daily.      metFORMIN 500 MG  tablet   Commonly known as: GLUCOPHAGE   Take 1 tablet (500 mg total) by mouth 2 (two) times daily with a meal.      nitrofurantoin 50 MG capsule   Commonly known as: MACRODANTIN   Take 50 mg by mouth daily.      simvastatin 40 MG tablet   Commonly known as: ZOCOR   Take 40 mg by mouth at bedtime.        Follow-up Information    Follow up with [facility], NP. (Our office will call wit the appt. date and time.)    Contact information:   [address] [place] [address] [phone]          Signed: Wilburt Finlay 10/17/2011, 10:37 AM

## 2011-10-17 NOTE — Progress Notes (Signed)
   ELECTROPHYSIOLOGY ROUNDING NOTE    Patient Name: Kathleen [name] Date of Encounter: 10-17-2011    SUBJECTIVE: Patient feels well.  No chest pain or shortness of breath.  S/p CRT upgrade 10-16-2011 with Dr Ladona Ridgel.   TELEMETRY: Reviewed telemetry pt in sinus rhythm with CRT pacing Filed Vitals:   10/16/11 1614 10/16/11 1957 10/16/11 2032 10/17/11 0645  BP: 154/73  119/53 154/63  Pulse: 70  67 60  Temp: 98.9 F (37.2 C)  98 F (36.7 C) 98.4 F (36.9 C)  TempSrc: Oral  Oral Oral  Resp: 20  19 18   Height:      Weight:    152 lb 12.8 oz (69.31 kg)  SpO2: 95% 94% 95% 98%    Intake/Output Summary (Last 24 hours) at 10/17/11 0725 Last data filed at 10/17/11 0646  Gross per 24 hour  Intake    460 ml  Output    850 ml  Net   -390 ml    LABS: Basic Metabolic Panel:  Basename 10/16/11 0505 10/15/11 0610  NA 132* 136  K 4.7 4.1  CL 97 98  CO2 28 29  GLUCOSE 110* 120*  BUN 15 13  CREATININE 1.54* 1.48*  CALCIUM 9.2 8.8  MG -- --  PHOS -- --  CBC:  Basename 10/15/11 0610  WBC 6.4  NEUTROABS --  HGB 11.1*  HCT 33.1*  MCV 98.2  PLT 274    Radiology/Studies:  Final result pending, leads in stable position.  PHYSICAL EXAM Left chest without hematoma or ecchymosis.   DEVICE INTERROGATION: Device interrogated by industry.  Lead values including impedence, sensing, threshold within normal values.    Wound care, arm mobility, restrictions reviewed with patient.  Follow up not scheduled with [place].  We are happy to see for wound check if [facility] would like for [place] to.   A/P    Doing well s/p CRT upgrade DC to home today with routine wound care and follow-up with [facility].  We will see as needed.  Please call with questions.  Fayrene Fearing Auther Lyerly,MD

## 2011-10-17 NOTE — Plan of Care (Signed)
Problem: Discharge Progression Outcomes Goal: Able to perform self care activities Outcome: Completed/Met Date Met:  10/17/11 Pt oob ad lib, able to perform self hygiene, goal met Goal: Discharge plan in place and appropriate Outcome: Completed/Met Date Met:  10/17/11 Pt is being D/C to home with daughter, pt given d/c instructions and follow up appointments, pt verbalized understanding, pt leaving with family via wheelchair, pt stable upon d/c

## 2011-10-31 ENCOUNTER — Ambulatory Visit (INDEPENDENT_AMBULATORY_CARE_PROVIDER_SITE_OTHER): Payer: Medicare Other | Admitting: *Deleted

## 2011-10-31 DIAGNOSIS — I480 Paroxysmal atrial fibrillation: Secondary | ICD-10-CM

## 2011-10-31 DIAGNOSIS — I4891 Unspecified atrial fibrillation: Secondary | ICD-10-CM

## 2011-10-31 DIAGNOSIS — I428 Other cardiomyopathies: Secondary | ICD-10-CM

## 2011-10-31 DIAGNOSIS — I429 Cardiomyopathy, unspecified: Secondary | ICD-10-CM

## 2011-10-31 LAB — PACEMAKER DEVICE OBSERVATION
AL AMPLITUDE: 2.375 mv
AL THRESHOLD: 0.75 V
BAMS-0001: 170 {beats}/min
LV LEAD THRESHOLD: 1.25 V
RV LEAD THRESHOLD: 1 V

## 2011-10-31 NOTE — Progress Notes (Signed)
Wound check-PPM 

## 2011-11-17 ENCOUNTER — Encounter: Payer: Self-pay | Admitting: Internal Medicine

## 2012-02-18 ENCOUNTER — Other Ambulatory Visit (HOSPITAL_COMMUNITY): Payer: Self-pay | Admitting: Nephrology

## 2012-02-18 DIAGNOSIS — N289 Disorder of kidney and ureter, unspecified: Secondary | ICD-10-CM

## 2012-03-18 ENCOUNTER — Ambulatory Visit (HOSPITAL_COMMUNITY)
Admission: RE | Admit: 2012-03-18 | Discharge: 2012-03-18 | Disposition: A | Discharge status: Home / Self Care | Payer: Medicare Other | Source: Ambulatory Visit | Attending: Nephrology | Admitting: Nephrology

## 2012-03-18 DIAGNOSIS — N289 Disorder of kidney and ureter, unspecified: Secondary | ICD-10-CM | POA: Insufficient documentation

## 2012-04-12 ENCOUNTER — Other Ambulatory Visit (HOSPITAL_COMMUNITY): Payer: Self-pay | Admitting: Internal Medicine

## 2012-04-12 DIAGNOSIS — M79662 Pain in left lower leg: Secondary | ICD-10-CM

## 2012-04-13 ENCOUNTER — Ambulatory Visit (HOSPITAL_COMMUNITY)
Admission: RE | Admit: 2012-04-13 | Discharge: 2012-04-13 | Disposition: A | Discharge status: Home / Self Care | Payer: Medicare Other | Source: Ambulatory Visit | Attending: Internal Medicine | Admitting: Internal Medicine

## 2012-04-13 DIAGNOSIS — M79662 Pain in left lower leg: Secondary | ICD-10-CM

## 2012-04-13 DIAGNOSIS — M79609 Pain in unspecified limb: Secondary | ICD-10-CM | POA: Insufficient documentation

## 2012-04-14 LAB — PACEMAKER DEVICE OBSERVATION

## 2012-06-07 ENCOUNTER — Other Ambulatory Visit: Payer: Self-pay | Admitting: Cardiovascular Disease

## 2012-06-07 DIAGNOSIS — I428 Other cardiomyopathies: Secondary | ICD-10-CM

## 2012-06-07 LAB — PACEMAKER DEVICE OBSERVATION

## 2012-06-21 ENCOUNTER — Encounter: Payer: Self-pay | Admitting: *Deleted

## 2012-06-21 LAB — REMOTE PACEMAKER DEVICE
AL IMPEDENCE PM: 437 Ohm
AL THRESHOLD: 0.625 V
ATRIAL PACING PM: 0.8
BAMS-0001: 170 {beats}/min
BATTERY VOLTAGE: 3.03 V
RV LEAD IMPEDENCE PM: 494 Ohm
VENTRICULAR PACING PM: 99.9

## 2012-07-21 ENCOUNTER — Other Ambulatory Visit: Payer: Self-pay | Admitting: Cardiovascular Disease

## 2012-07-21 ENCOUNTER — Encounter: Payer: Self-pay | Admitting: Cardiovascular Disease

## 2012-07-21 DIAGNOSIS — I428 Other cardiomyopathies: Secondary | ICD-10-CM

## 2012-07-21 LAB — PACEMAKER DEVICE OBSERVATION

## 2012-08-13 ENCOUNTER — Other Ambulatory Visit: Payer: Self-pay | Admitting: *Deleted

## 2012-08-13 MED ORDER — CARVEDILOL 25 MG PO TABS: 25.0000 mg | ORAL_TABLET | Freq: Two times a day (BID) | ORAL | Status: DC

## 2012-08-24 ENCOUNTER — Other Ambulatory Visit: Payer: Self-pay | Admitting: *Deleted

## 2012-08-24 MED ORDER — AMLODIPINE BESYLATE 2.5 MG PO TABS: 2.5000 mg | ORAL_TABLET | Freq: Every day | ORAL | Status: DC

## 2012-08-24 NOTE — Telephone Encounter (Signed)
Rx was sent to pharmacy electronically. 

## 2012-08-25 ENCOUNTER — Encounter: Payer: Self-pay | Admitting: *Deleted

## 2012-08-25 LAB — REMOTE PACEMAKER DEVICE
AL IMPEDENCE PM: 437 Ohm
BATTERY VOLTAGE: 3.03 V
LV LEAD IMPEDENCE PM: 608 Ohm
LV LEAD THRESHOLD: 1.125 V
RV LEAD THRESHOLD: 1.125 V
VENTRICULAR PACING PM: 99.4

## 2012-09-13 ENCOUNTER — Other Ambulatory Visit: Payer: Self-pay | Admitting: Cardiovascular Disease

## 2012-09-13 DIAGNOSIS — I428 Other cardiomyopathies: Secondary | ICD-10-CM

## 2012-09-13 LAB — PACEMAKER DEVICE OBSERVATION

## 2012-09-21 ENCOUNTER — Encounter: Payer: Self-pay | Admitting: *Deleted

## 2012-09-21 LAB — REMOTE PACEMAKER DEVICE
AL IMPEDENCE PM: 437 Ohm
ATRIAL PACING PM: 0.7
BAMS-0001: 170 {beats}/min
BATTERY VOLTAGE: 3.03 V
LV LEAD THRESHOLD: 1.125 V

## 2012-10-05 ENCOUNTER — Other Ambulatory Visit: Payer: Self-pay | Admitting: *Deleted

## 2012-10-05 MED ORDER — SIMVASTATIN 40 MG PO TABS: 40.0000 mg | ORAL_TABLET | Freq: Every day | ORAL | Status: DC

## 2012-10-09 ENCOUNTER — Encounter: Payer: Self-pay | Admitting: *Deleted

## 2012-10-19 ENCOUNTER — Encounter: Payer: Self-pay | Admitting: Cardiovascular Disease

## 2012-10-19 ENCOUNTER — Ambulatory Visit (INDEPENDENT_AMBULATORY_CARE_PROVIDER_SITE_OTHER): Payer: Medicare Other | Admitting: Cardiovascular Disease

## 2012-10-19 VITALS — BP 128/64 | HR 61 | Resp 20 | Ht 68.0 in | Wt 158.4 lb

## 2012-10-19 DIAGNOSIS — I428 Other cardiomyopathies: Secondary | ICD-10-CM

## 2012-10-19 DIAGNOSIS — E039 Hypothyroidism, unspecified: Secondary | ICD-10-CM

## 2012-10-19 DIAGNOSIS — I420 Dilated cardiomyopathy: Secondary | ICD-10-CM

## 2012-10-19 DIAGNOSIS — Z950 Presence of cardiac pacemaker: Secondary | ICD-10-CM

## 2012-10-19 DIAGNOSIS — I2510 Atherosclerotic heart disease of native coronary artery without angina pectoris: Secondary | ICD-10-CM

## 2012-10-19 DIAGNOSIS — I509 Heart failure, unspecified: Secondary | ICD-10-CM

## 2012-10-19 DIAGNOSIS — E119 Type 2 diabetes mellitus without complications: Secondary | ICD-10-CM

## 2012-10-19 DIAGNOSIS — I5042 Chronic combined systolic (congestive) and diastolic (congestive) heart failure: Secondary | ICD-10-CM

## 2012-10-19 DIAGNOSIS — E785 Hyperlipidemia, unspecified: Secondary | ICD-10-CM

## 2012-10-19 DIAGNOSIS — I10 Essential (primary) hypertension: Secondary | ICD-10-CM

## 2012-10-19 NOTE — Patient Instructions (Addendum)
Dr C wants you to follow-up in 6 months. You will receive a reminder letter in the mail two months in advance. If you don't receive a letter, please call our office to schedule the follow-up appointment.  Dr. Salena Saner also wants you to have lab work done.

## 2012-10-20 ENCOUNTER — Encounter: Payer: Self-pay | Admitting: Cardiovascular Disease

## 2012-10-20 DIAGNOSIS — I5042 Chronic combined systolic (congestive) and diastolic (congestive) heart failure: Secondary | ICD-10-CM | POA: Insufficient documentation

## 2012-10-20 NOTE — Assessment & Plan Note (Signed)
Good blood pressure control 

## 2012-10-20 NOTE — Assessment & Plan Note (Signed)
She had a good clinical response to cardiac resynchronization therapy. Her most recent left ventricular ejection fraction was around 35%, but this was obtained before she received the biventricular pacemaker. We have not rechecked it since. She is on appropriate treatment with angiotensin receptor blockers a maximum dose of carvedilol and she does not require scheduled diuretic therapy. She is clinically euvolemic. NYHA functional class 1-2.

## 2012-10-20 NOTE — Assessment & Plan Note (Signed)
Most recent results show a total cholesterol of 153, triglycerides 105, HDL 56, LDL 76 no changes are made to her lipid-lowering regimen

## 2012-10-20 NOTE — Progress Notes (Signed)
Patient ID: Kathleen [name], female   DOB: 08-Aug-1924, 79 y.o.   MRN: [id number]      Reason for office visit CHF, Pacemaker, atrial tachycardia, hypertension  Kathleen [name] generally feels well. She denies major problems with shortness of breath and denies any chest pain. She has occasional orthostatic dizziness. She does occasionally "gives out" with greater than usual physical activity. She has a nonischemic cardiomyopathy that became very apparent following implantation of a dual-chamber permanent pacemaker with pacing induced ventricular asynchrony secondary to high grade atrioventricular block. Her ejection fraction dropped as low as 35%. She had a marked clinical response to initiation of biventricular pacing. She does not have significant CAD. Remote pacemaker check roughly one month ago showed excellent device function with virtually 100% biventricular pacing deficiency. Prior to pacemaker implantation her chart had mentioned paroxysmal atrial fibrillation. She has always refused treatment with warfarin. After pacemaker implantation there has been no detected atrial fibrillation, although occasional paroxysmal atrial tachycardia has been seen  Allergies  Allergen Reactions  . Coumadin [Warfarin Sodium] Other (See Comments)    Caused Patient to Bleed Out.   . Meloxicam Other (See Comments)    Unknown  . Morphine Itching    Not in right state of mind.   . Sulfonamide Derivatives Hives    Current Outpatient Prescriptions  Medication Sig Dispense Refill  . albuterol (PROVENTIL) (2.5 MG/3ML) 0.083% nebulizer solution Take 2.5 mg by nebulization every 6 (six) hours as needed. Congestion      . amLODipine (NORVASC) 2.5 MG tablet Take 1 tablet (2.5 mg total) by mouth daily.  30 tablet  9  . aspirin EC 325 MG tablet Take 325 mg by mouth daily.      . carvedilol (COREG) 25 MG tablet Take 1 tablet (25 mg total) by mouth 2 (two) times daily with a meal.  60 tablet  6  . docusate sodium  (COLACE) 100 MG capsule Take 100 mg by mouth as needed.      Marland Kitchen losartan (COZAAR) 100 MG tablet Take 50 mg by mouth daily.       . Multiple Vitamins-Minerals (CENTRUM SILVER PO) Take 1 tablet by mouth daily.      . simvastatin (ZOCOR) 40 MG tablet Take 1 tablet (40 mg total) by mouth at bedtime.  30 tablet  5  . SYMBICORT 160-4.5 MCG/ACT inhaler as needed.      Marland Kitchen glimepiride (AMARYL) 2 MG tablet Take 1 mg by mouth every other day.      . [DISCONTINUED] benazepril (LOTENSIN) 40 MG tablet Take 40 mg by mouth 2 (two) times daily.       No current facility-administered medications for this visit.    Past Medical History  Diagnosis Date  . Hypertension   . Diabetes mellitus   . High cholesterol   . Atrial fibrillation   . Paroxysmal a-fib 12/13/2010  . PNA (pneumonia)   . CAD (coronary artery disease) 2006  . Acid reflux   . AV block, 2nd degree     S/p Pacemaker 03/2011  . CHF (congestive heart failure)   . Shortness of breath   . Pacemaker     Past Surgical History  Procedure Laterality Date  . Replacement total knee    . Bladder tact    . Pacemaker insertion    . Cholecystectomy    . Bladder tack      Family History  Problem Relation Age of Onset  . Diabetes Brother   . Heart murmur  Daughter     History   Social History  . Marital Status: Widowed    Spouse Name: N/A    Number of Children: N/A  . Years of Education: N/A   Occupational History  . Not on file.   Social History Main Topics  . Smoking status: Never Smoker   . Smokeless tobacco: Never Used  . Alcohol Use: No  . Drug Use: No  . Sexual Activity: Not Currently   Other Topics Concern  . Not on file   Social History Narrative  . No narrative on file    Review of systems: The patient specifically denies any chest pain at rest or with exertion, dyspnea at rest or with exertion, orthopnea, paroxysmal nocturnal dyspnea, syncope, palpitations, focal neurological deficits, intermittent claudication,  lower extremity edema, unexplained weight gain, cough, hemoptysis or wheezing.  The patient also denies abdominal pain, nausea, vomiting, dysphagia, diarrhea, constipation, polyuria, polydipsia, dysuria, hematuria, frequency, urgency, abnormal bleeding or bruising, fever, chills, unexpected weight changes, mood swings, change in skin or hair texture, change in voice quality, auditory or visual problems, allergic reactions or rashes, new musculoskeletal complaints other than usual "aches and pains".   PHYSICAL EXAM BP 128/64  Pulse 61  Resp 20  Ht 5\' 8"  (1.727 m)  Wt 158 lb 6.4 oz (71.85 kg)  BMI 24.09 kg/m2  General: Alert, oriented x3, no distress Head: no evidence of trauma, PERRL, EOMI, no exophtalmos or lid lag, no myxedema, no xanthelasma; normal ears, nose and oropharynx Neck: normal jugular venous pulsations and no hepatojugular reflux; brisk carotid pulses without delay and no carotid bruits Chest: clear to auscultation, no signs of consolidation by percussion or palpation, normal fremitus, symmetrical and full respiratory excursions; healthy left subclavian pacemaker site Cardiovascular: normal position and quality of the apical impulse, regular rhythm, normal first and paradoxically split second heart sounds, no murmurs, rubs or gallops Abdomen: no tenderness or distention, no masses by palpation, no abnormal pulsatility or arterial bruits, normal bowel sounds, no hepatosplenomegaly Extremities: no clubbing, cyanosis or edema; 2+ radial, ulnar and brachial pulses bilaterally; 2+ right femoral, posterior tibial and dorsalis pedis pulses; 2+ left femoral, posterior tibial and dorsalis pedis pulses; no subclavian or femoral bruits Neurological: grossly nonfocal   EKG: Atrial ventricular sequential pacing  Lipid Panel  No results found for this basename: chol, trig, hdl, cholhdl, vldl, ldlcalc    BMET    Component Value Date/Time   NA 134* 10/17/2011 0705   K 3.8 10/17/2011 0705     CL 98 10/17/2011 0705   CO2 27 10/17/2011 0705   GLUCOSE 121* 10/17/2011 0705   BUN 15 10/17/2011 0705   CREATININE 1.43* 10/17/2011 0705   CALCIUM 9.4 10/17/2011 0705   GFRNONAA 33* 10/17/2011 0705   GFRAA 38* 10/17/2011 0705     ASSESSMENT AND PLAN Cardiomyopathy, dilated, nonischemic She had a good clinical response to cardiac resynchronization therapy. Her most recent left ventricular ejection fraction was around 35%, but this was obtained before she received the biventricular pacemaker. We have not rechecked it since. She is on appropriate treatment with angiotensin receptor blockers a maximum dose of carvedilol and she does not require scheduled diuretic therapy. She is clinically euvolemic. NYHA functional class 1-2.  PPM-Medtronic Initially received a conventional dual chamber device in March of last year for intermittent second degree AV block and bifascicular block. She then developed advanced heart block and symptoms of congestive heart failure. Echocardiography showed deterioration in left ventricular systolic function, probably mostly  due to pacing induced asynchrony. Current device was implanted in October 2013. Remote device interrogation on September 1: Normal device function. Thresholds, sensing, impedance consistent with previous measurements. Histograms appropriate for patient and level of activity. No mode switches or ventricular high rate episodes. Patient bi-ventricularly pacing >99% of the time. Device heart failure diagnostics are within normal limits and stable over time. Estimated longevity 7 years. No changes in device programming were performed today Her pacemaker has previously recorded episodes of paroxysmal atrial tachycardia. Before pacemaker implantation she had a history of paroxysmal atrial fibrillation but none has been detected by her pacemaker.   Hyperlipidemia Most recent results show a total cholesterol of 153, triglycerides 105, HDL 56, LDL 76 no changes are  made to her lipid-lowering regimen  DM type 2 (diabetes mellitus, type 2) She reports that glycemic control is good  Hypertension Good blood pressure control  CKD (chronic kidney disease) stage 3, GFR 30-59 ml/min Baseline creatinine around 1.4-1.6, creatinine clearance just over 30 mL per minute   Orders Placed This Encounter  Procedures  . TSH  . EKG 12-Lead   Meds ordered this encounter  Medications  . glimepiride (AMARYL) 2 MG tablet    Sig: Take 1 mg by mouth every other day.  . SYMBICORT 160-4.5 MCG/ACT inhaler    Sig: as needed.    Junious Silk, MD, [facility] [phone] office [phone] pager

## 2012-10-20 NOTE — Assessment & Plan Note (Signed)
Baseline creatinine around 1.4-1.6, creatinine clearance just over 30 mL per minute

## 2012-10-20 NOTE — Assessment & Plan Note (Signed)
Initially received a conventional dual chamber device in March of last year for intermittent second degree AV block and bifascicular block. She then developed advanced heart block and symptoms of congestive heart failure. Echocardiography showed deterioration in left ventricular systolic function, probably mostly due to pacing induced asynchrony. Current device was implanted in October 2013. Remote device interrogation on September 1: Normal device function. Thresholds, sensing, impedance consistent with previous measurements. Histograms appropriate for patient and level of activity. No mode switches or ventricular high rate episodes. Patient bi-ventricularly pacing >99% of the time. Device heart failure diagnostics are within normal limits and stable over time. Estimated longevity 7 years. No changes in device programming were performed today Her pacemaker has previously recorded episodes of paroxysmal atrial tachycardia. Before pacemaker implantation she had a history of paroxysmal atrial fibrillation but none has been detected by her pacemaker.

## 2012-10-20 NOTE — Assessment & Plan Note (Signed)
She reports that glycemic control is good

## 2012-10-25 ENCOUNTER — Encounter: Payer: Self-pay | Admitting: Cardiovascular Disease

## 2012-12-14 ENCOUNTER — Ambulatory Visit (INDEPENDENT_AMBULATORY_CARE_PROVIDER_SITE_OTHER): Payer: Medicare Other

## 2012-12-14 DIAGNOSIS — I509 Heart failure, unspecified: Secondary | ICD-10-CM

## 2012-12-14 DIAGNOSIS — I441 Atrioventricular block, second degree: Secondary | ICD-10-CM

## 2012-12-14 DIAGNOSIS — I480 Paroxysmal atrial fibrillation: Secondary | ICD-10-CM

## 2012-12-14 DIAGNOSIS — I420 Dilated cardiomyopathy: Secondary | ICD-10-CM

## 2012-12-14 DIAGNOSIS — I428 Other cardiomyopathies: Secondary | ICD-10-CM

## 2012-12-14 DIAGNOSIS — I5042 Chronic combined systolic (congestive) and diastolic (congestive) heart failure: Secondary | ICD-10-CM

## 2012-12-14 DIAGNOSIS — I4891 Unspecified atrial fibrillation: Secondary | ICD-10-CM

## 2012-12-14 LAB — MDC_IDC_ENUM_SESS_TYPE_REMOTE
Battery Voltage: 3.02 V
Brady Statistic AP VP Percent: 1.09 %
Brady Statistic AP VS Percent: 0.01 %
Brady Statistic AS VP Percent: 98.64 %
Brady Statistic RA Percent Paced: 1.1 %
Brady Statistic RV Percent Paced: 99.73 %
Date Time Interrogation Session: 20141202212355
Lead Channel Impedance Value: 323 Ohm
Lead Channel Impedance Value: 380 Ohm
Lead Channel Impedance Value: 418 Ohm
Lead Channel Impedance Value: 456 Ohm
Lead Channel Impedance Value: 608 Ohm
Lead Channel Impedance Value: 627 Ohm
Lead Channel Pacing Threshold Amplitude: 0.75 V
Lead Channel Pacing Threshold Amplitude: 1.25 V
Lead Channel Pacing Threshold Pulse Width: 0.4 ms
Lead Channel Pacing Threshold Pulse Width: 0.4 ms
Lead Channel Sensing Intrinsic Amplitude: 2 mV
Lead Channel Sensing Intrinsic Amplitude: 2 mV
Lead Channel Sensing Intrinsic Amplitude: 28.5 mV
Lead Channel Setting Pacing Amplitude: 2 V
Lead Channel Setting Pacing Pulse Width: 0.4 ms
Lead Channel Setting Sensing Sensitivity: 4 mV
Zone Setting Detection Interval: 350 ms
Zone Setting Detection Interval: 400 ms

## 2012-12-30 ENCOUNTER — Encounter: Payer: Self-pay | Admitting: *Deleted

## 2013-01-17 ENCOUNTER — Ambulatory Visit (INDEPENDENT_AMBULATORY_CARE_PROVIDER_SITE_OTHER): Payer: Medicare Other | Admitting: *Deleted

## 2013-01-17 ENCOUNTER — Ambulatory Visit: Payer: Medicare Other

## 2013-01-17 DIAGNOSIS — I480 Paroxysmal atrial fibrillation: Secondary | ICD-10-CM

## 2013-01-17 DIAGNOSIS — I4891 Unspecified atrial fibrillation: Secondary | ICD-10-CM

## 2013-01-17 DIAGNOSIS — I428 Other cardiomyopathies: Secondary | ICD-10-CM

## 2013-01-17 DIAGNOSIS — I420 Dilated cardiomyopathy: Secondary | ICD-10-CM

## 2013-01-17 LAB — MDC_IDC_ENUM_SESS_TYPE_REMOTE
Battery Remaining Longevity: 80 mo
Brady Statistic AP VP Percent: 0.45 %
Brady Statistic AP VS Percent: 0.01 %
Brady Statistic AS VP Percent: 99.37 %
Brady Statistic AS VS Percent: 0.17 %
Brady Statistic RV Percent Paced: 99.82 %
Date Time Interrogation Session: 20150105210926
Lead Channel Impedance Value: 304 Ohm
Lead Channel Impedance Value: 380 Ohm
Lead Channel Impedance Value: 456 Ohm
Lead Channel Impedance Value: 456 Ohm
Lead Channel Impedance Value: 608 Ohm
Lead Channel Pacing Threshold Amplitude: 0.75 V
Lead Channel Pacing Threshold Amplitude: 1.125 V
Lead Channel Pacing Threshold Pulse Width: 0.4 ms
Lead Channel Sensing Intrinsic Amplitude: 2 mV
Lead Channel Sensing Intrinsic Amplitude: 27.5 mV
Lead Channel Setting Pacing Pulse Width: 0.4 ms
Lead Channel Setting Sensing Sensitivity: 4 mV
MDC IDC MSMT BATTERY VOLTAGE: 3.02 V
MDC IDC MSMT LEADCHNL LV IMPEDANCE VALUE: 608 Ohm
MDC IDC MSMT LEADCHNL LV PACING THRESHOLD PULSEWIDTH: 0.4 ms
MDC IDC MSMT LEADCHNL RA IMPEDANCE VALUE: 342 Ohm
MDC IDC MSMT LEADCHNL RA IMPEDANCE VALUE: 418 Ohm
MDC IDC MSMT LEADCHNL RV IMPEDANCE VALUE: 494 Ohm
MDC IDC MSMT LEADCHNL RV PACING THRESHOLD AMPLITUDE: 1.125 V
MDC IDC MSMT LEADCHNL RV PACING THRESHOLD PULSEWIDTH: 0.4 ms
MDC IDC SET LEADCHNL LV PACING AMPLITUDE: 2.25 V
MDC IDC SET LEADCHNL RA PACING AMPLITUDE: 2 V
MDC IDC SET LEADCHNL RV PACING AMPLITUDE: 2.25 V
MDC IDC SET LEADCHNL RV PACING PULSEWIDTH: 0.4 ms
MDC IDC STAT BRADY RA PERCENT PACED: 0.46 %
Zone Setting Detection Interval: 350 ms
Zone Setting Detection Interval: 400 ms

## 2013-01-17 LAB — PACEMAKER DEVICE OBSERVATION

## 2013-01-30 ENCOUNTER — Encounter: Payer: Self-pay | Admitting: *Deleted

## 2013-03-07 ENCOUNTER — Other Ambulatory Visit (HOSPITAL_COMMUNITY): Payer: Self-pay | Admitting: Family Medicine

## 2013-03-07 DIAGNOSIS — N183 Chronic kidney disease, stage 3 unspecified: Secondary | ICD-10-CM

## 2013-03-07 DIAGNOSIS — R109 Unspecified abdominal pain: Secondary | ICD-10-CM

## 2013-03-11 ENCOUNTER — Ambulatory Visit (HOSPITAL_COMMUNITY)
Admission: RE | Admit: 2013-03-11 | Discharge: 2013-03-11 | Disposition: A | Discharge status: Home / Self Care | Payer: Medicare Other | Source: Ambulatory Visit | Attending: Family Medicine | Admitting: Family Medicine

## 2013-03-11 ENCOUNTER — Other Ambulatory Visit (HOSPITAL_COMMUNITY): Payer: Self-pay | Admitting: Family Medicine

## 2013-03-11 DIAGNOSIS — N183 Chronic kidney disease, stage 3 unspecified: Secondary | ICD-10-CM

## 2013-03-11 DIAGNOSIS — N949 Unspecified condition associated with female genital organs and menstrual cycle: Secondary | ICD-10-CM | POA: Insufficient documentation

## 2013-03-11 DIAGNOSIS — R109 Unspecified abdominal pain: Secondary | ICD-10-CM

## 2013-03-11 DIAGNOSIS — N189 Chronic kidney disease, unspecified: Secondary | ICD-10-CM | POA: Insufficient documentation

## 2013-03-16 ENCOUNTER — Other Ambulatory Visit: Payer: Self-pay

## 2013-03-16 MED ORDER — CARVEDILOL 25 MG PO TABS: 25.0000 mg | ORAL_TABLET | Freq: Two times a day (BID) | ORAL | Status: DC

## 2013-03-16 NOTE — Telephone Encounter (Signed)
Rx was sent to pharmacy electronically. 

## 2013-04-06 ENCOUNTER — Other Ambulatory Visit: Payer: Self-pay

## 2013-04-06 MED ORDER — SIMVASTATIN 40 MG PO TABS: 40.0000 mg | ORAL_TABLET | Freq: Every day | ORAL | Status: DC

## 2013-04-06 NOTE — Telephone Encounter (Signed)
Rx was sent to pharmacy electronically. 

## 2013-04-19 ENCOUNTER — Encounter: Payer: Medicare Other | Admitting: Cardiovascular Disease

## 2013-04-21 ENCOUNTER — Encounter: Payer: Self-pay | Admitting: *Deleted

## 2013-04-29 ENCOUNTER — Ambulatory Visit (INDEPENDENT_AMBULATORY_CARE_PROVIDER_SITE_OTHER): Payer: Medicare Other | Admitting: Cardiovascular Disease

## 2013-04-29 ENCOUNTER — Encounter: Payer: Self-pay | Admitting: Cardiovascular Disease

## 2013-04-29 VITALS — BP 132/80 | HR 71 | Resp 16 | Ht 68.0 in | Wt 164.6 lb

## 2013-04-29 DIAGNOSIS — Z950 Presence of cardiac pacemaker: Secondary | ICD-10-CM

## 2013-04-29 DIAGNOSIS — I4891 Unspecified atrial fibrillation: Secondary | ICD-10-CM

## 2013-04-29 DIAGNOSIS — I2510 Atherosclerotic heart disease of native coronary artery without angina pectoris: Secondary | ICD-10-CM

## 2013-04-29 DIAGNOSIS — I10 Essential (primary) hypertension: Secondary | ICD-10-CM

## 2013-04-29 DIAGNOSIS — I5021 Acute systolic (congestive) heart failure: Secondary | ICD-10-CM

## 2013-04-29 DIAGNOSIS — I5042 Chronic combined systolic (congestive) and diastolic (congestive) heart failure: Secondary | ICD-10-CM

## 2013-04-29 DIAGNOSIS — I509 Heart failure, unspecified: Secondary | ICD-10-CM

## 2013-04-29 DIAGNOSIS — I441 Atrioventricular block, second degree: Secondary | ICD-10-CM

## 2013-04-29 DIAGNOSIS — I428 Other cardiomyopathies: Secondary | ICD-10-CM

## 2013-04-29 DIAGNOSIS — E785 Hyperlipidemia, unspecified: Secondary | ICD-10-CM

## 2013-04-29 DIAGNOSIS — I480 Paroxysmal atrial fibrillation: Secondary | ICD-10-CM

## 2013-04-29 DIAGNOSIS — I420 Dilated cardiomyopathy: Secondary | ICD-10-CM

## 2013-04-29 LAB — MDC_IDC_ENUM_SESS_TYPE_INCLINIC
Battery Remaining Longevity: 6.5
Battery Voltage: 3.02 V
Brady Statistic AP VP Percent: 0.9 %
Brady Statistic AP VS Percent: 0.1 % — CL
Brady Statistic AS VP Percent: 98.9 %
Brady Statistic AS VS Percent: 0.2 %
Lead Channel Impedance Value: 418 Ohm
Lead Channel Impedance Value: 551 Ohm
Lead Channel Pacing Threshold Amplitude: 0.75 V
Lead Channel Pacing Threshold Pulse Width: 0.4 ms
Lead Channel Sensing Intrinsic Amplitude: 2.8 mV
Lead Channel Setting Pacing Amplitude: 2 V
Lead Channel Setting Pacing Pulse Width: 0.4 ms
MDC IDC MSMT LEADCHNL LV IMPEDANCE VALUE: 646 Ohm
MDC IDC MSMT LEADCHNL LV PACING THRESHOLD AMPLITUDE: 1.25 V
MDC IDC MSMT LEADCHNL LV PACING THRESHOLD PULSEWIDTH: 0.4 ms
MDC IDC MSMT LEADCHNL RV PACING THRESHOLD AMPLITUDE: 1 V
MDC IDC MSMT LEADCHNL RV PACING THRESHOLD PULSEWIDTH: 0.4 ms
MDC IDC MSMT LEADCHNL RV SENSING INTR AMPL: 20 mV — AB
MDC IDC SET LEADCHNL RA PACING AMPLITUDE: 2 V
MDC IDC SET LEADCHNL RV PACING AMPLITUDE: 2.25 V
MDC IDC SET LEADCHNL RV PACING PULSEWIDTH: 0.4 ms
MDC IDC SET LEADCHNL RV SENSING SENSITIVITY: 4 mV
MDC IDC SET ZONE DETECTION INTERVAL: 350 ms
MDC IDC SET ZONE DETECTION INTERVAL: 400 ms

## 2013-04-29 LAB — PACEMAKER DEVICE OBSERVATION

## 2013-04-29 NOTE — Patient Instructions (Signed)
Remote monitoring is used to monitor your pacemaker from home. This monitoring reduces the number of office visits required to check your device to one time per year. It allows [place] to keep an eye on the functioning of your device to ensure it is working properly. You are scheduled for a device check from home on 08-01-2013. You may send your transmission at any time that day. If you have a wireless device, the transmission will be sent automatically. After your physician reviews your transmission, you will receive a postcard with your next transmission date.  Your physician recommends that you schedule a follow-up appointment in: 6 months with Dr.Croitoru

## 2013-04-30 ENCOUNTER — Encounter: Payer: Self-pay | Admitting: Cardiovascular Disease

## 2013-04-30 NOTE — Assessment & Plan Note (Signed)
She had more recent labs with her primary care physician and we will try to retrieve them

## 2013-04-30 NOTE — Assessment & Plan Note (Signed)
Good control

## 2013-04-30 NOTE — Assessment & Plan Note (Signed)
Her CRT-P. device is functioning normally. She has had 2 episodes of electromagnetic interference that occurred several months ago. Thankfully she is not truly pacemaker dependent and the EMI did not lead to serious malfunction. She has not been using electric blankets for similar devices. No permanent programming changes were made.

## 2013-04-30 NOTE — Progress Notes (Signed)
Patient ID: Kathleen [name], female   DOB: Jul 25, 1924, 80 y.o.   MRN: [id number]      Reason for office visit Nonischemic cardiomyopathy, high-grade second-degree atrioventricular block, CRT-P followup  [name] has not had serious health challenges since her last appointment in October. She has occasional chest tightness that is related to using her arms it sounds muscular activity. She has [place] Heart Association functional class II dyspnea on exertion: She become short of breath" she walks too far". She has occasional evening ankle edema that resolves by the morning. She had a lot of bruising when taking full dose aspirin and has decreased it to 81 mg daily.  Interrogation of her dual-chamber biventricular pacemaker today shows normal function and fairly constant levels of activity. Biventricular pacing occurs virtually 100% of the time but she does not require atrial pacing more than 1% of the time. One very brief but true episode of nonsustained ventricular tachycardia (6 beats at 180 beats per minute) has been recorded, but was asymptomatic. She's not truly pacemaker dependent but has frequent episodes of high-grade second-degree AV block. Is also a single brief episode of paroxysmal atrial tachycardia lasting for only 11 seconds at 135 beats per minute  There are 2 episodes that are concerning for electromagnetic interference. They were categorized by the device is atrial fibrillation and are clearly exogenous. There is interference on both the atrial and ventricular channels. Each event lasted for about 7 minutes and did not really interfere with normal pacemaker function. She did not have any symptoms that she can recall at that time. She was receiving help from a chiropractor and TENS unit therapy and vaginal appeared of time, but the episodes happened in the evenings when she must of been at home. It is not clear what caused the EMI.  She has a nonischemic cardiomyopathy that  became very apparent following implantation of a dual-chamber permanent pacemaker with pacing induced ventricular asynchrony secondary to high grade atrioventricular block. Her ejection fraction dropped as low as 35%. She had a marked clinical response to initiation of biventricular pacing. She does not have significant CAD by angio 2013.  Prior to pacemaker implantation her chart had mentioned paroxysmal atrial fibrillation. She has always refused treatment with warfarin. After pacemaker implantation there has been no detected atrial fibrillation, although occasional paroxysmal atrial tachycardia has been seen. She has mild chronic kidney disease, stage III with baseline creatinine around 1.5. She has treated  DM type 2, hypertension and hyperlipidemia.    Allergies  Allergen Reactions  . Coumadin [Warfarin Sodium] Other (See Comments)    Caused Patient to Bleed Out.   . Meloxicam Other (See Comments)    Unknown  . Morphine Itching    Not in right state of mind.   . Sulfonamide Derivatives Hives    Current Outpatient Prescriptions  Medication Sig Dispense Refill  . amLODipine (NORVASC) 2.5 MG tablet Take 1 tablet (2.5 mg total) by mouth daily.  30 tablet  9  . aspirin EC 325 MG tablet Take 81 mg by mouth daily.       . carvedilol (COREG) 25 MG tablet Take 1 tablet (25 mg total) by mouth 2 (two) times daily with a meal.  60 tablet  7  . docusate sodium (COLACE) 100 MG capsule Take 100 mg by mouth as needed.      Marland Kitchen glimepiride (AMARYL) 2 MG tablet Take 1 mg by mouth every other day.      . losartan (COZAAR)  100 MG tablet Take 50 mg by mouth daily.       . Multiple Vitamins-Minerals (CENTRUM SILVER PO) Take 1 tablet by mouth daily.      . simvastatin (ZOCOR) 40 MG tablet Take 1 tablet (40 mg total) by mouth at bedtime.  30 tablet  7  . SYMBICORT 160-4.5 MCG/ACT inhaler as needed.      . [DISCONTINUED] benazepril (LOTENSIN) 40 MG tablet Take 40 mg by mouth 2 (two) times daily.       No  current facility-administered medications for this visit.    Past Medical History  Diagnosis Date  . Hypertension   . Diabetes mellitus   . High cholesterol   . Atrial fibrillation   . Paroxysmal a-fib 12/13/2010  . PNA (pneumonia)   . CAD (coronary artery disease) 2006  . Acid reflux   . AV block, 2nd degree     S/p Pacemaker 03/2011  . CHF (congestive heart failure)   . Shortness of breath   . Pacemaker 10/16/2011    medtronic  . Nonischemic cardiomyopathy   . Chronic kidney disease (CKD), stage III (moderate)     Past Surgical History  Procedure Laterality Date  . Replacement total knee    . Bladder tact  1970's  . Pacemaker insertion  10/16/2011    Medtronic  . Cholecystectomy  1999  . Bladder tack    . Nm myocar perf wall motion  03/15/2009    Normal  . Cardiac catheterization  10/15/2011    Normal coronaries    Family History  Problem Relation Age of Onset  . Heart murmur Daughter   . Suicidality Mother   . Heart attack Brother     History   Social History  . Marital Status: Widowed    Spouse [name]: N/A    Number of Children: N/A  . Years of Education: N/A   Occupational History  . Not on file.   Social History Main Topics  . Smoking status: Never Smoker   . Smokeless tobacco: Never Used  . Alcohol Use: No  . Drug Use: No  . Sexual Activity: Not Currently   Other Topics Concern  . Not on file   Social History Narrative  . No narrative on file    Review of systems: The patient specifically denies any chest pain at rest or with exertion, dyspnea at rest, orthopnea, paroxysmal nocturnal dyspnea, syncope, palpitations, focal neurological deficits, intermittent claudication, lower extremity edema, unexplained weight gain, cough, hemoptysis or wheezing.  The patient also denies abdominal pain, nausea, vomiting, dysphagia, diarrhea, constipation, polyuria, polydipsia, dysuria, hematuria, frequency, urgency, abnormal bleeding or bruising, fever, chills,  unexpected weight changes, mood swings, change in skin or hair texture, change in voice quality, auditory or visual problems, allergic reactions or rashes, new musculoskeletal complaints other than usual "aches and pains".   PHYSICAL EXAM BP 132/80  Pulse 71  Resp 16  Ht 5\' 8"  (1.727 m)  Wt 164 lb 9.6 oz (74.662 kg)  BMI 25.03 kg/m2 General: Alert, oriented x3, no distress  Head: no evidence of trauma, PERRL, EOMI, no exophtalmos or lid lag, no myxedema, no xanthelasma; normal ears, nose and oropharynx  Neck: normal jugular venous pulsations and no hepatojugular reflux; brisk carotid pulses without delay and no carotid bruits  Chest: clear to auscultation, no signs of consolidation by percussion or palpation, normal fremitus, symmetrical and full respiratory excursions; healthy left subclavian pacemaker site  Cardiovascular: normal position and quality of the apical impulse, regular  rhythm, normal first and paradoxically split second heart sounds, no murmurs, rubs or gallops  Abdomen: no tenderness or distention, no masses by palpation, no abnormal pulsatility or arterial bruits, normal bowel sounds, no hepatosplenomegaly  Extremities: no clubbing, cyanosis or edema; 2+ radial, ulnar and brachial pulses bilaterally; 2+ right femoral, posterior tibial and dorsalis pedis pulses; 2+ left femoral, posterior tibial and dorsalis pedis pulses; no subclavian or femoral bruits  Neurological: grossly nonfocal   EKG: Atrial sensed, biventricular paced with a right bundle branch block QRS morphology  Lipid Panel  Total cholesterol 153, TG 105, HDL 56, LDL 76  BMET    Component Value Date/Time   NA 134* 10/17/2011 0705   K 3.8 10/17/2011 0705   CL 98 10/17/2011 0705   CO2 27 10/17/2011 0705   GLUCOSE 121* 10/17/2011 0705   BUN 15 10/17/2011 0705   CREATININE 1.43* 10/17/2011 0705   CALCIUM 9.4 10/17/2011 0705   GFRNONAA 33* 10/17/2011 0705   GFRAA 38* 10/17/2011 0705     ASSESSMENT AND  PLAN Cardiomyopathy, dilated, nonischemic Well compensated, NYHA class II, clinically euvolemic, on carvedilol and angiotensin receptor blocker; she does not require loop diuretics. Last LVEF documentation was 35-40%, but this was before implementation of CRT P.  PPM-Medtronic Her CRT-P. device is functioning normally. She has had 2 episodes of electromagnetic interference that occurred several months ago. Thankfully she is not truly pacemaker dependent and the EMI did not lead to serious malfunction. She has not been using electric blankets for similar devices. No permanent programming changes were made.  Hyperlipidemia She had more recent labs with her primary care physician and we will try to retrieve them  Hypertension Good control   Patient Instructions  Remote monitoring is used to monitor your pacemaker from home. This monitoring reduces the number of office visits required to check your device to one time per year. It allows [place] to keep an eye on the functioning of your device to ensure it is working properly. You are scheduled for a device check from home on 08-01-2013. You may send your transmission at any time that day. If you have a wireless device, the transmission will be sent automatically. After your physician reviews your transmission, you will receive a postcard with your next transmission date.  Your physician recommends that you schedule a follow-up appointment in: 6 months with Dr.Yolette Hastings      Orders Placed This Encounter  Procedures  . EKG 12-Lead   No orders of the defined types were placed in this encounter.    Sumayya Muha  Thurmon Fair, MD, [facility] [phone] office [phone] pager

## 2013-04-30 NOTE — Assessment & Plan Note (Signed)
Well compensated, NYHA class II, clinically euvolemic, on carvedilol and angiotensin receptor blocker; she does not require loop diuretics. Last LVEF documentation was 35-40%, but this was before implementation of CRT P.

## 2013-07-06 ENCOUNTER — Other Ambulatory Visit: Payer: Self-pay | Admitting: Cardiovascular Disease

## 2013-07-06 NOTE — Telephone Encounter (Signed)
Rx was sent to pharmacy electronically. 

## 2013-08-01 ENCOUNTER — Ambulatory Visit (INDEPENDENT_AMBULATORY_CARE_PROVIDER_SITE_OTHER): Payer: Medicare Other | Admitting: *Deleted

## 2013-08-01 DIAGNOSIS — I420 Dilated cardiomyopathy: Secondary | ICD-10-CM

## 2013-08-01 DIAGNOSIS — I428 Other cardiomyopathies: Secondary | ICD-10-CM

## 2013-08-01 LAB — MDC_IDC_ENUM_SESS_TYPE_REMOTE
Battery Remaining Longevity: 78 mo
Brady Statistic AP VP Percent: 0.84 %
Brady Statistic AP VS Percent: 0.01 %
Brady Statistic AS VP Percent: 98.92 %
Brady Statistic AS VS Percent: 0.23 %
Brady Statistic RV Percent Paced: 99.76 %
Date Time Interrogation Session: 20150720194604
Lead Channel Impedance Value: 323 Ohm
Lead Channel Impedance Value: 361 Ohm
Lead Channel Impedance Value: 418 Ohm
Lead Channel Impedance Value: 494 Ohm
Lead Channel Impedance Value: 494 Ohm
Lead Channel Impedance Value: 665 Ohm
Lead Channel Pacing Threshold Amplitude: 0.625 V
Lead Channel Pacing Threshold Amplitude: 1 V
Lead Channel Pacing Threshold Amplitude: 1 V
Lead Channel Pacing Threshold Pulse Width: 0.4 ms
Lead Channel Sensing Intrinsic Amplitude: 2.125 mV
Lead Channel Sensing Intrinsic Amplitude: 26.125 mV
Lead Channel Setting Pacing Amplitude: 2 V
Lead Channel Setting Pacing Pulse Width: 0.4 ms
Lead Channel Setting Sensing Sensitivity: 4 mV
MDC IDC MSMT BATTERY VOLTAGE: 3.02 V
MDC IDC MSMT LEADCHNL LV IMPEDANCE VALUE: 646 Ohm
MDC IDC MSMT LEADCHNL LV PACING THRESHOLD PULSEWIDTH: 0.4 ms
MDC IDC MSMT LEADCHNL RV IMPEDANCE VALUE: 418 Ohm
MDC IDC MSMT LEADCHNL RV IMPEDANCE VALUE: 513 Ohm
MDC IDC MSMT LEADCHNL RV PACING THRESHOLD PULSEWIDTH: 0.4 ms
MDC IDC SET LEADCHNL LV PACING AMPLITUDE: 2 V
MDC IDC SET LEADCHNL RA PACING AMPLITUDE: 2 V
MDC IDC SET LEADCHNL RV PACING PULSEWIDTH: 0.4 ms
MDC IDC SET ZONE DETECTION INTERVAL: 350 ms
MDC IDC STAT BRADY RA PERCENT PACED: 0.85 %
Zone Setting Detection Interval: 400 ms

## 2013-08-01 NOTE — Progress Notes (Signed)
Remote pacemaker transmission.   

## 2013-08-08 ENCOUNTER — Telehealth: Payer: Self-pay | Admitting: Cardiovascular Disease

## 2013-08-08 NOTE — Telephone Encounter (Signed)
Closed encounter °

## 2013-08-16 ENCOUNTER — Encounter: Payer: Self-pay | Admitting: Cardiology

## 2013-08-19 ENCOUNTER — Encounter: Payer: Self-pay | Admitting: Cardiovascular Disease

## 2013-10-04 ENCOUNTER — Inpatient Hospital Stay (HOSPITAL_COMMUNITY)
Admission: EM | Admit: 2013-10-04 | Discharge: 2013-10-05 | DRG: 309 | Disposition: A | Discharge status: Home Health | Payer: Medicare Other | Attending: Internal Medicine | Admitting: Internal Medicine

## 2013-10-04 ENCOUNTER — Emergency Department (HOSPITAL_COMMUNITY): Payer: Medicare Other

## 2013-10-04 ENCOUNTER — Encounter (HOSPITAL_COMMUNITY): Payer: Self-pay | Admitting: Emergency Medicine

## 2013-10-04 DIAGNOSIS — I509 Heart failure, unspecified: Secondary | ICD-10-CM | POA: Diagnosis present

## 2013-10-04 DIAGNOSIS — Z950 Presence of cardiac pacemaker: Secondary | ICD-10-CM | POA: Diagnosis not present

## 2013-10-04 DIAGNOSIS — E7800 Pure hypercholesterolemia, unspecified: Secondary | ICD-10-CM | POA: Diagnosis present

## 2013-10-04 DIAGNOSIS — K219 Gastro-esophageal reflux disease without esophagitis: Secondary | ICD-10-CM | POA: Diagnosis present

## 2013-10-04 DIAGNOSIS — I5021 Acute systolic (congestive) heart failure: Secondary | ICD-10-CM

## 2013-10-04 DIAGNOSIS — N183 Chronic kidney disease, stage 3 unspecified: Secondary | ICD-10-CM

## 2013-10-04 DIAGNOSIS — J4489 Other specified chronic obstructive pulmonary disease: Secondary | ICD-10-CM | POA: Diagnosis present

## 2013-10-04 DIAGNOSIS — I4891 Unspecified atrial fibrillation: Secondary | ICD-10-CM | POA: Diagnosis present

## 2013-10-04 DIAGNOSIS — E876 Hypokalemia: Secondary | ICD-10-CM

## 2013-10-04 DIAGNOSIS — M25469 Effusion, unspecified knee: Secondary | ICD-10-CM

## 2013-10-04 DIAGNOSIS — R5381 Other malaise: Secondary | ICD-10-CM

## 2013-10-04 DIAGNOSIS — E119 Type 2 diabetes mellitus without complications: Secondary | ICD-10-CM

## 2013-10-04 DIAGNOSIS — I2510 Atherosclerotic heart disease of native coronary artery without angina pectoris: Secondary | ICD-10-CM | POA: Diagnosis present

## 2013-10-04 DIAGNOSIS — R5383 Other fatigue: Secondary | ICD-10-CM

## 2013-10-04 DIAGNOSIS — J449 Chronic obstructive pulmonary disease, unspecified: Secondary | ICD-10-CM | POA: Diagnosis present

## 2013-10-04 DIAGNOSIS — I480 Paroxysmal atrial fibrillation: Secondary | ICD-10-CM

## 2013-10-04 DIAGNOSIS — I428 Other cardiomyopathies: Secondary | ICD-10-CM | POA: Diagnosis present

## 2013-10-04 DIAGNOSIS — Z8249 Family history of ischemic heart disease and other diseases of the circulatory system: Secondary | ICD-10-CM | POA: Diagnosis not present

## 2013-10-04 DIAGNOSIS — IMO0002 Reserved for concepts with insufficient information to code with codable children: Secondary | ICD-10-CM

## 2013-10-04 DIAGNOSIS — E785 Hyperlipidemia, unspecified: Secondary | ICD-10-CM

## 2013-10-04 DIAGNOSIS — R269 Unspecified abnormalities of gait and mobility: Secondary | ICD-10-CM

## 2013-10-04 DIAGNOSIS — R911 Solitary pulmonary nodule: Secondary | ICD-10-CM

## 2013-10-04 DIAGNOSIS — N390 Urinary tract infection, site not specified: Secondary | ICD-10-CM

## 2013-10-04 DIAGNOSIS — Z23 Encounter for immunization: Secondary | ICD-10-CM

## 2013-10-04 DIAGNOSIS — I441 Atrioventricular block, second degree: Secondary | ICD-10-CM

## 2013-10-04 DIAGNOSIS — I5042 Chronic combined systolic (congestive) and diastolic (congestive) heart failure: Secondary | ICD-10-CM | POA: Diagnosis present

## 2013-10-04 DIAGNOSIS — M4800 Spinal stenosis, site unspecified: Secondary | ICD-10-CM

## 2013-10-04 DIAGNOSIS — I420 Dilated cardiomyopathy: Secondary | ICD-10-CM

## 2013-10-04 DIAGNOSIS — I200 Unstable angina: Secondary | ICD-10-CM

## 2013-10-04 DIAGNOSIS — R0902 Hypoxemia: Secondary | ICD-10-CM

## 2013-10-04 DIAGNOSIS — G5790 Unspecified mononeuropathy of unspecified lower limb: Secondary | ICD-10-CM

## 2013-10-04 DIAGNOSIS — N289 Disorder of kidney and ureter, unspecified: Secondary | ICD-10-CM

## 2013-10-04 DIAGNOSIS — R531 Weakness: Secondary | ICD-10-CM

## 2013-10-04 DIAGNOSIS — M1710 Unilateral primary osteoarthritis, unspecified knee: Secondary | ICD-10-CM

## 2013-10-04 DIAGNOSIS — I129 Hypertensive chronic kidney disease with stage 1 through stage 4 chronic kidney disease, or unspecified chronic kidney disease: Secondary | ICD-10-CM | POA: Diagnosis present

## 2013-10-04 LAB — HEMOGLOBIN A1C
Hgb A1c MFr Bld: 8.2 % — ABNORMAL HIGH (ref <5.7)
Mean Plasma Glucose: 189 mg/dL — ABNORMAL HIGH (ref <117)

## 2013-10-04 LAB — HEPATIC FUNCTION PANEL
ALBUMIN: 3.8 g/dL (ref 3.5–5.2)
ALT: 16 U/L (ref 0–35)
AST: 22 U/L (ref 0–37)
Alkaline Phosphatase: 85 U/L (ref 39–117)
Bilirubin, Direct: <0.2 mg/dL (ref 0.0–0.3)
TOTAL PROTEIN: 7.5 g/dL (ref 6.0–8.3)
Total Bilirubin: 0.5 mg/dL (ref 0.3–1.2)

## 2013-10-04 LAB — URINALYSIS, ROUTINE W REFLEX MICROSCOPIC
Bilirubin Urine: NEGATIVE
GLUCOSE, UA: NEGATIVE mg/dL
KETONES UR: NEGATIVE mg/dL
Nitrite: NEGATIVE
PROTEIN: NEGATIVE mg/dL
Specific Gravity, Urine: <1.005 — ABNORMAL LOW (ref 1.005–1.030)
UROBILINOGEN UA: 0.2 mg/dL (ref 0.0–1.0)
pH: 6.5 (ref 5.0–8.0)

## 2013-10-04 LAB — CBC WITH DIFFERENTIAL/PLATELET
BASOS ABS: 0 10*3/uL (ref 0.0–0.1)
Basophils Relative: 0 % (ref 0–1)
Eosinophils Absolute: 0.2 10*3/uL (ref 0.0–0.7)
Eosinophils Relative: 3 % (ref 0–5)
HCT: 35.8 % — ABNORMAL LOW (ref 36.0–46.0)
Hemoglobin: 12.5 g/dL (ref 12.0–15.0)
Lymphocytes Relative: 24 % (ref 12–46)
Lymphs Abs: 1.7 10*3/uL (ref 0.7–4.0)
MCH: 33.7 pg (ref 26.0–34.0)
MCHC: 34.9 g/dL (ref 30.0–36.0)
MCV: 96.5 fL (ref 78.0–100.0)
Monocytes Absolute: 0.6 10*3/uL (ref 0.1–1.0)
Monocytes Relative: 8 % (ref 3–12)
NEUTROS ABS: 4.7 10*3/uL (ref 1.7–7.7)
Neutrophils Relative %: 65 % (ref 43–77)
Platelets: 234 10*3/uL (ref 150–400)
RBC: 3.71 MIL/uL — ABNORMAL LOW (ref 3.87–5.11)
RDW: 13.1 % (ref 11.5–15.5)
WBC: 7.3 10*3/uL (ref 4.0–10.5)

## 2013-10-04 LAB — TSH: TSH: 3.75 u[IU]/mL (ref 0.350–4.500)

## 2013-10-04 LAB — PHOSPHORUS: PHOSPHORUS: 3.3 mg/dL (ref 2.3–4.6)

## 2013-10-04 LAB — LIPID PANEL
CHOLESTEROL: 149 mg/dL (ref 0–200)
HDL: 79 mg/dL (ref >39)
LDL Cholesterol: 48 mg/dL (ref 0–99)
Total CHOL/HDL Ratio: 1.9 RATIO
Triglycerides: 110 mg/dL (ref <150)
VLDL: 22 mg/dL (ref 0–40)

## 2013-10-04 LAB — PRO B NATRIURETIC PEPTIDE: PRO B NATRI PEPTIDE: 398.6 pg/mL (ref 0–450)

## 2013-10-04 LAB — BASIC METABOLIC PANEL
ANION GAP: 12 (ref 5–15)
BUN: 21 mg/dL (ref 6–23)
CHLORIDE: 99 meq/L (ref 96–112)
CO2: 28 mEq/L (ref 19–32)
Calcium: 9.6 mg/dL (ref 8.4–10.5)
Creatinine, Ser: 1.23 mg/dL — ABNORMAL HIGH (ref 0.50–1.10)
GFR calc Af Amer: 45 mL/min — ABNORMAL LOW (ref >90)
GFR calc non Af Amer: 39 mL/min — ABNORMAL LOW (ref >90)
Glucose, Bld: 149 mg/dL — ABNORMAL HIGH (ref 70–99)
Potassium: 3.8 mEq/L (ref 3.7–5.3)
Sodium: 139 mEq/L (ref 137–147)

## 2013-10-04 LAB — TROPONIN I: Troponin I: <0.3 ng/mL (ref <0.30)

## 2013-10-04 LAB — URINE MICROSCOPIC-ADD ON

## 2013-10-04 LAB — GLUCOSE, CAPILLARY
GLUCOSE-CAPILLARY: 196 mg/dL — AB (ref 70–99)
Glucose-Capillary: 225 mg/dL — ABNORMAL HIGH (ref 70–99)

## 2013-10-04 LAB — MRSA PCR SCREENING: MRSA by PCR: NEGATIVE

## 2013-10-04 LAB — MAGNESIUM: MAGNESIUM: 2.1 mg/dL (ref 1.5–2.5)

## 2013-10-04 MED ORDER — CARVEDILOL 12.5 MG PO TABS: 25.0000 mg | ORAL_TABLET | Freq: Two times a day (BID) | ORAL | Status: DC

## 2013-10-04 MED ORDER — SODIUM CHLORIDE 0.9 % IV SOLN: 250.0000 mL | INTRAVENOUS | Status: DC | PRN

## 2013-10-04 MED ORDER — ACETAMINOPHEN 325 MG PO TABS: 650.0000 mg | ORAL_TABLET | Freq: Four times a day (QID) | ORAL | Status: DC | PRN

## 2013-10-04 MED ORDER — DILTIAZEM HCL 100 MG IV SOLR
5.0000 mg/h | Freq: Once | INTRAVENOUS | Status: AC
  Administered 2013-10-04: 5 mg/h via INTRAVENOUS
  Filled 2013-10-04: qty 100

## 2013-10-04 MED ORDER — ALBUTEROL SULFATE (2.5 MG/3ML) 0.083% IN NEBU: 2.5000 mg | INHALATION_SOLUTION | Freq: Four times a day (QID) | RESPIRATORY_TRACT | Status: DC | PRN

## 2013-10-04 MED ORDER — DOCUSATE SODIUM 100 MG PO CAPS: 100.0000 mg | ORAL_CAPSULE | Freq: Two times a day (BID) | ORAL | Status: DC | PRN

## 2013-10-04 MED ORDER — SIMVASTATIN 20 MG PO TABS: 40.0000 mg | ORAL_TABLET | Freq: Every day | ORAL | Status: DC

## 2013-10-04 MED ORDER — ASPIRIN EC 81 MG PO TBEC
81.0000 mg | DELAYED_RELEASE_TABLET | Freq: Every day | ORAL | Status: DC
  Administered 2013-10-04 – 2013-10-05 (×2): 81 mg via ORAL
  Filled 2013-10-04 (×2): qty 1

## 2013-10-04 MED ORDER — SODIUM CHLORIDE 0.9 % IJ SOLN: 3.0000 mL | INTRAMUSCULAR | Status: DC | PRN

## 2013-10-04 MED ORDER — DILTIAZEM HCL 100 MG IV SOLR
10.0000 mg/h | Freq: Once | INTRAVENOUS | Status: AC
  Administered 2013-10-04: 10 mg/h via INTRAVENOUS
  Filled 2013-10-04: qty 100

## 2013-10-04 MED ORDER — ALUM & MAG HYDROXIDE-SIMETH 200-200-20 MG/5ML PO SUSP: 30.0000 mL | Freq: Four times a day (QID) | ORAL | Status: DC | PRN

## 2013-10-04 MED ORDER — DILTIAZEM HCL 100 MG IV SOLR
5.0000 mg/h | INTRAVENOUS | Status: DC
  Filled 2013-10-04: qty 100

## 2013-10-04 MED ORDER — DILTIAZEM HCL 25 MG/5ML IV SOLN
10.0000 mg | Freq: Once | INTRAVENOUS | Status: AC
  Administered 2013-10-04: 10 mg via INTRAVENOUS
  Filled 2013-10-04: qty 5

## 2013-10-04 MED ORDER — HYDROCODONE-ACETAMINOPHEN 5-325 MG PO TABS: 1.0000 | ORAL_TABLET | ORAL | Status: DC | PRN

## 2013-10-04 MED ORDER — SODIUM CHLORIDE 0.9 % IJ SOLN
3.0000 mL | Freq: Two times a day (BID) | INTRAMUSCULAR | Status: DC
  Administered 2013-10-04 – 2013-10-05 (×2): 3 mL via INTRAVENOUS

## 2013-10-04 MED ORDER — INSULIN ASPART 100 UNIT/ML ~~LOC~~ SOLN
0.0000 [IU] | Freq: Three times a day (TID) | SUBCUTANEOUS | Status: DC
  Administered 2013-10-04: 3 [IU] via SUBCUTANEOUS
  Administered 2013-10-05: 2 [IU] via SUBCUTANEOUS
  Administered 2013-10-05: 3 [IU] via SUBCUTANEOUS

## 2013-10-04 MED ORDER — DILTIAZEM HCL 30 MG PO TABS
30.0000 mg | ORAL_TABLET | Freq: Four times a day (QID) | ORAL | Status: DC
  Administered 2013-10-04 – 2013-10-05 (×3): 30 mg via ORAL
  Filled 2013-10-04 (×3): qty 1

## 2013-10-04 MED ORDER — INFLUENZA VAC SPLIT QUAD 0.5 ML IM SUSY
0.5000 mL | PREFILLED_SYRINGE | INTRAMUSCULAR | Status: AC
  Administered 2013-10-05: 0.5 mL via INTRAMUSCULAR
  Filled 2013-10-04: qty 0.5

## 2013-10-04 MED ORDER — ATORVASTATIN CALCIUM 20 MG PO TABS
20.0000 mg | ORAL_TABLET | Freq: Every day | ORAL | Status: DC
  Administered 2013-10-04: 20 mg via ORAL
  Filled 2013-10-04: qty 1

## 2013-10-04 MED ORDER — AMLODIPINE BESYLATE 5 MG PO TABS
2.5000 mg | ORAL_TABLET | Freq: Every day | ORAL | Status: DC
  Administered 2013-10-04: 2.5 mg via ORAL
  Filled 2013-10-04: qty 1

## 2013-10-04 MED ORDER — ONDANSETRON HCL 4 MG/2ML IJ SOLN: 4.0000 mg | Freq: Four times a day (QID) | INTRAMUSCULAR | Status: DC | PRN

## 2013-10-04 MED ORDER — SODIUM CHLORIDE 0.9 % IV BOLUS (SEPSIS)
500.0000 mL | Freq: Once | INTRAVENOUS | Status: AC
  Administered 2013-10-04: 500 mL via INTRAVENOUS

## 2013-10-04 MED ORDER — ENOXAPARIN SODIUM 40 MG/0.4ML ~~LOC~~ SOLN
40.0000 mg | SUBCUTANEOUS | Status: DC
Start: 2013-10-04 — End: 2013-10-05
  Administered 2013-10-04: 40 mg via SUBCUTANEOUS
  Filled 2013-10-04: qty 0.4

## 2013-10-04 MED ORDER — ACETAMINOPHEN 650 MG RE SUPP
650.0000 mg | Freq: Four times a day (QID) | RECTAL | Status: DC | PRN
Start: 2013-10-04 — End: 2013-10-05

## 2013-10-04 MED ORDER — ONDANSETRON HCL 4 MG PO TABS: 4.0000 mg | ORAL_TABLET | Freq: Four times a day (QID) | ORAL | Status: DC | PRN

## 2013-10-04 NOTE — ED Provider Notes (Signed)
CSN: [id number]     Arrival date & time 10/04/13  4008 History  This chart was scribed for Benny Lennert, MD by Carl Best, ED Scribe. This patient was seen in room APA07/APA07 and the patient's care was started at 8:31 AM.     Chief Complaint  Patient presents with  . Tachycardia    Patient is a 80 y.o. female presenting with palpitations and weakness. The history is provided by the patient. No language interpreter was used.  Palpitations Palpitations quality:  Fast Onset quality:  Sudden Duration:  2 hours Timing:  Constant Progression:  Unchanged Chronicity:  New Relieved by:  None tried Ineffective treatments:  None tried Associated symptoms: no nausea and no vomiting   Risk factors: diabetes mellitus   Weakness This is a new problem. The current episode started 1 to 2 hours ago. The problem occurs rarely. The problem has not changed since onset.Nothing aggravates the symptoms. Nothing relieves the symptoms.   HPI Comments: Kathleen [name] is a 80 y.o. female who presents to the Emergency Department complaining of constant, generalized weakness and palpitations that started this morning when the patient got up to go to the restroom.  The patient denies being in any pain.  She denies nausea and vomiting as associated symptoms.  She has not taken her medications this morning.  Her cardiologist is with [facility] and she calls him "Dr. Salena Saner".  Dr. Susann Givens performed her pacemaker implant surgery.    Past Medical History  Diagnosis Date  . Hypertension   . Diabetes mellitus   . High cholesterol   . Atrial fibrillation   . Paroxysmal a-fib 12/13/2010  . PNA (pneumonia)   . CAD (coronary artery disease) 2006  . Acid reflux   . AV block, 2nd degree     S/p Pacemaker 03/2011  . CHF (congestive heart failure)   . Shortness of breath   . Pacemaker 10/16/2011    medtronic  . Nonischemic cardiomyopathy   . Chronic kidney disease (CKD), stage III (moderate)     Past Surgical History  Procedure Laterality Date  . Replacement total knee    . Bladder tact  1970's  . Pacemaker insertion  10/16/2011    Medtronic  . Cholecystectomy  1999  . Bladder tack    . Nm myocar perf wall motion  03/15/2009    Normal  . Cardiac catheterization  10/15/2011    Normal coronaries   Family History  Problem Relation Age of Onset  . Heart murmur Daughter   . Suicidality Mother   . Heart attack Brother    History  Substance Use Topics  . Smoking status: Never Smoker   . Smokeless tobacco: Never Used  . Alcohol Use: No   OB History   Grav Para Term Preterm Abortions TAB SAB Ect Mult Living                 Review of Systems  Cardiovascular: Positive for palpitations.  Gastrointestinal: Negative for nausea and vomiting.  Neurological: Positive for weakness.  All other systems reviewed and are negative.     Allergies  Coumadin; Meloxicam; Morphine; and Sulfonamide derivatives  Home Medications   Prior to Admission medications   Medication Sig Start Date End Date Taking? Authorizing Provider  amLODipine (NORVASC) 2.5 MG tablet TAKE ONE TABLET BY MOUTH DAILY. 07/06/13   Mihai Croitoru, MD  aspirin EC 325 MG tablet Take 81 mg by mouth daily.     Historical Provider, MD  carvedilol (COREG) 25 MG tablet Take 1 tablet (25 mg total) by mouth 2 (two) times daily with a meal. 03/16/13   Mihai Croitoru, MD  docusate sodium (COLACE) 100 MG capsule Take 100 mg by mouth as needed.    Historical Provider, MD  glimepiride (AMARYL) 2 MG tablet Take 1 mg by mouth every other day. 08/24/12   Historical Provider, MD  losartan (COZAAR) 100 MG tablet Take 50 mg by mouth daily.     Historical Provider, MD  Multiple Vitamins-Minerals (CENTRUM SILVER PO) Take 1 tablet by mouth daily.    Historical Provider, MD  simvastatin (ZOCOR) 40 MG tablet Take 1 tablet (40 mg total) by mouth at bedtime. 04/06/13   Thurmon Fair, MD  SYMBICORT 160-4.5 MCG/ACT inhaler as needed. 10/05/12    Historical Provider, MD   BP 132/100  Pulse 127  Temp(Src) 98.7 F (37.1 C) (Oral)  Resp 22  Ht  (1.727 m)  Wt 168 lb (76.204 kg)  BMI 25.55 kg/m2  SpO2 98%  Physical Exam  Nursing note and vitals reviewed. Constitutional: She is oriented to person, place, and time. She appears well-developed.  HENT:  Head: Normocephalic.  Eyes: Conjunctivae and EOM are normal. No scleral icterus.  Neck: Neck supple. No thyromegaly present.  Cardiovascular: Regular rhythm.  Tachycardia present.  Exam reveals no gallop and no friction rub.   No murmur heard. Rapid irregular heartbeat.   Pulmonary/Chest: No stridor. She has no wheezes. She has no rales. She exhibits no tenderness.  Abdominal: She exhibits no distension. There is no tenderness. There is no rebound.  Musculoskeletal: Normal range of motion. She exhibits no edema.  Lymphadenopathy:    She has no cervical adenopathy.  Neurological: She is oriented to person, place, and time. She exhibits normal muscle tone. Coordination normal.  Skin: No rash noted. No erythema.  Psychiatric: She has a normal mood and affect. Her behavior is normal.    ED Course  Procedures (including critical care time)  DIAGNOSTIC STUDIES: Oxygen Saturation is 98% on room air, normal by my interpretation.    COORDINATION OF CARE: 8:34 AM- Will order medication to treat the patient's tachycardia.  The patient agreed to the treatment plan.    Labs Review Labs Reviewed  CBC WITH DIFFERENTIAL - Abnormal; Notable for the following:    RBC 3.71 (*)    HCT 35.8 (*)    All other components within normal limits  BASIC METABOLIC PANEL  TROPONIN I  PRO B NATRIURETIC PEPTIDE  HEPATIC FUNCTION PANEL    Imaging Review Dg Chest Portable 1 View  10/04/2013   CLINICAL DATA:  TACHYCARDIA  EXAM: PORTABLE CHEST - 1 VIEW  COMPARISON:  10/17/2011  FINDINGS: Left subclavian 2 lead pacer noted. Mild cardiomegaly with slight vascular congestion compared to the prior  study. Background COPD/ emphysema noted with apical pleural parenchymal scarring. No definite focal pneumonia, collapse or consolidation. No effusion or pneumothorax. Bones are osteopenic.  IMPRESSION: Cardiomegaly with mild vascular congestion  Background COPD/emphysema with apical pleural parenchymal scarring.   Electronically Signed   By: Ruel Favors M.D.   On: 10/04/2013 08:54     EKG Interpretation   Date/Time:  Tuesday October 04 2013 08:25:53 EDT Ventricular Rate:  131 PR Interval:    QRS Duration: 150 QT Interval:  364 QTC Calculation: 537 R Axis:   108 Text Interpretation:  Atrial fibrillation Right bundle branch block Repol  abnrm suggests ischemia, diffuse leads Baseline wander in lead(s) I III  aVL  V3 Confirmed by Elverta Dimiceli  MD, Ashunti Schofield [phone]) on 10/04/2013 11:13:27 AM      MDM   Final diagnoses:  None    The chart was scribed for me under my direct supervision.  I personally performed the history, physical, and medical decision making and all procedures in the evaluation of this patient.Benny Lennert, MD 10/04/13 1115

## 2013-10-04 NOTE — Progress Notes (Signed)
*  PRELIMINARY RESULTS* Echocardiogram 2D Echocardiogram has been performed.  Kathleen [name] 10/04/2013, 2:25 PM

## 2013-10-04 NOTE — H&P (Signed)
I have directly reviewed the clinical findings, lab, imaging studies and management of this patient in detail. I have interviewed and examined the patient and agree with the documentation,  as recorded by the Physician extender, Ms. Toya Smothers, NP.  Atrial fibrillation with RVR Currently patient is on Cardizem drip, that will be tapered off. Cardiology has also seen the patient, and will likely transition the patient to by mouth Cardizem. Coreg has been held. We'll obtain a TSH, magnesium, phosphate levels as well as echocardiogram.  Generalized weakness Likely secondary to atrial fibrillation with RVR. Will have physical therapy assess patient.   Edsel Petrin D.O. on 10/04/2013 at 4:04 PM  [facility] Office  [phone]

## 2013-10-04 NOTE — Consult Note (Signed)
Primary cardiologist: Dr Alphonzo Severance Consulting cardiologist: Dr Dina Rich  Clinical Summary Kathleen [name] is a 80 y.o.female history of NICM LVEF 35-40% by echo 09/2011(this was prior to CRT-P), history of high degree second degree heart block with CRT-P, CKD, DM2, HTN, hyperlipidemia admitted with palpitations. Reports intermittent short episodes of palpitations for several weeks, prolonged continous episode today leading her to come to the ER. There is mention in clinic notes of questionable remote history of afib, however had never been noted on device checks. Found to be in afib with RVR in ER. She received dilt  IVx2 , started on dilt gtt.   TSH pending, trop neg x1, Cr 1.23, GFR 39, K 3.8, Mg pending CXR COPD changes, no acute finding EKG afib RVR with RBBB  Her prior history of paroxysmal afib is somewhat unclear. From notes in 06/2008 she had troubles on coumadin previously for her afib, with recurrent knee hemoarthrosis requiring aspiration. Coumadin levels were reportedly very labile, and bleeding occurred in setting of "being overanticoagulated". She reports one mechanical fall around that time which seemed to the be final factor in deciding to stop her coumadin. She denies recurrent falls since that time.   She was admitted 12/2010 and noted to be in afib with RVR, was started on amio at that time. She declined anticoagulation at that time.    Allergies  Allergen Reactions  . Coumadin [Warfarin Sodium] Other (See Comments)    Caused Patient to Bleed Out.   . Meloxicam Other (See Comments)    Unknown  . Morphine Itching    Not in right state of mind.   . Sulfonamide Derivatives Hives    Medications Scheduled Medications: . amLODipine  2.5 mg Oral Daily  . aspirin EC  81 mg Oral Daily  . enoxaparin (LOVENOX) injection  40 mg Subcutaneous Q24H  . [START ON 10/05/2013] Influenza vac split quadrivalent PF  0.5 mL Intramuscular Tomorrow-1000  . insulin aspart   0-9 Units Subcutaneous TID WC  . simvastatin  40 mg Oral QHS  . sodium chloride  3 mL Intravenous Q12H     Infusions: . diltiazem (CARDIZEM) infusion       PRN Medications:  sodium chloride, acetaminophen, acetaminophen, albuterol, alum & mag hydroxide-simeth, docusate sodium, HYDROcodone-acetaminophen, ondansetron (ZOFRAN) IV, ondansetron, sodium chloride   Past Medical History  Diagnosis Date  . Hypertension   . Diabetes mellitus   . High cholesterol   . Atrial fibrillation   . Paroxysmal a-fib 12/13/2010  . PNA (pneumonia)   . CAD (coronary artery disease) 2006  . Acid reflux   . AV block, 2nd degree     S/p Pacemaker 03/2011  . CHF (congestive heart failure)   . Shortness of breath   . Pacemaker 10/16/2011    medtronic  . Nonischemic cardiomyopathy   . Chronic kidney disease (CKD), stage III (moderate)     Past Surgical History  Procedure Laterality Date  . Replacement total knee    . Bladder tact  1970's  . Pacemaker insertion  10/16/2011    Medtronic  . Cholecystectomy  1999  . Bladder tack    . Nm myocar perf wall motion  03/15/2009    Normal  . Cardiac catheterization  10/15/2011    Normal coronaries    Family History  Problem Relation Age of Onset  . Heart murmur Daughter   . Suicidality Mother   . Heart attack Brother     Social History Kathleen [name] reports that  she has never smoked. She has never used smokeless tobacco. Kathleen [name] reports that she does not drink alcohol.  Review of Systems CONSTITUTIONAL: No weight loss, fever, chills, weakness or fatigue.  HEENT: Eyes: No visual loss, blurred vision, double vision or yellow sclerae. No hearing loss, sneezing, congestion, runny nose or sore throat.  SKIN: No rash or itching.  CARDIOVASCULAR: per HPI RESPIRATORY: No shortness of breath, cough or sputum.  GASTROINTESTINAL: No anorexia, nausea, vomiting or diarrhea. No abdominal pain or blood.  GENITOURINARY: no polyuria, no  dysuria NEUROLOGICAL: No headache, dizziness, syncope, paralysis, ataxia, numbness or tingling in the extremities. No change in bowel or bladder control.  MUSCULOSKELETAL: No muscle, back pain, joint pain or stiffness.  HEMATOLOGIC: No anemia, bleeding or bruising.  LYMPHATICS: No enlarged nodes. No history of splenectomy.  PSYCHIATRIC: No history of depression or anxiety.      Physical Examination Blood pressure 112/62, pulse 80, temperature 98.7 F (37.1 C), temperature source Oral, resp. rate 16, height  (1.727 m), weight 157 lb 3 oz (71.3 kg), SpO2 94.00%.  Intake/Output Summary (Last 24 hours) at 10/04/13 1331 Last data filed at 10/04/13 1023  Gross per 24 hour  Intake      0 ml  Output    400 ml  Net   -400 ml    HEENT: sclera clear  Cardiovascular: irreg, no m/r/g, no JVD  Respiratory: clear anteriorally  GI: abd soft, NT, ND  MSK: no LE edema  Neuro: no focal deficits  Psych: appropriate affect   Lab Results  Basic Metabolic Panel:  Recent Labs Lab 10/04/13 0856  NA 139  K 3.8  CL 99  CO2 28  GLUCOSE 149*  BUN 21  CREATININE 1.23*  CALCIUM 9.6    Liver Function Tests:  Recent Labs Lab 10/04/13 0856  AST 22  ALT 16  ALKPHOS 85  BILITOT 0.5  PROT 7.5  ALBUMIN 3.8    CBC:  Recent Labs Lab 10/04/13 0856  WBC 7.3  NEUTROABS 4.7  HGB 12.5  HCT 35.8*  MCV 96.5  PLT 234    Cardiac Enzymes:  Recent Labs Lab 10/04/13 0856  TROPONINI <0.30    BNP: No components found with this basename: POCBNP,    ECG   Imaging Echo 09/2011 Study Conclusions  - Left ventricle: The cavity size was mildly dilated. There was mild concentric hypertrophy. Systolic function was moderately reduced. The estimated ejection fraction was in the range of 35% to 40%. Diffuse hypokinesis. Severe hypokinesis of the inferior and apical myocardium. Features are consistent with a pseudonormal left ventricular filling pattern, with concomitant  abnormal relaxation and increased filling pressure (grade 2 diastolic dysfunction). Doppler parameters are consistent with elevated mean left atrial filling pressure. - Ventricular septum: Septal motion showed abnormal function and dyssynergy. These changes are consistent with right ventricular pacing. - Mitral valve: Mild regurgitation directed centrally. - Pulmonary arteries: Systolic pressure was mildly increased. PA peak pressure: 46mm Hg (S).  10/2011 Cath Hemodynamics:  Central Aortic Pressure / Mean Aortic Pressure: 147/62  LV Pressure / LV End diastolic Pressure: 15  Right Heart Catheterization:  RA - 3  RV - 37/4  PA - 36/11 (23)  PCWP - 15  TPG - 8  PA Sat - 65%  AO Sat - 96% (RA)  TDCO/CI - 2.65/1.47  FCO/CI - 5.12/2.84  PVR - 2.2 wood units  Coronary Angiographic Data:  Left Main: Short left main, angiographically normal  Left Anterior Descending (LAD): Angiographically  normal, normal caliber, extends around the apex  1st diagonal (D1): Moderate sized vessel, no stenosis  2nd diagonal (D2): Small vessel, no angiographic stenosis.  Circumflex (LCx): Moderate sized vessel, no stenosis  1st obtuse marginal: Large vessel with high lateral takeoff, no stenosis.  Ramus Intermedius: True ramus vessel, moderate sized, that coarses between the LAD and high OM Kathleen [name]. No stenosis.  Right Coronary Artery: Smaller caliber, but dominant. No angiographic stenosis. Ostial speck of calcium, but not stenotic.  posterior descending artery: Normal  posterior lateral Kathleen [name]: Normal Impression:  1. Angiographically normal coronary arteries.  2. Reduced cardiac output with normal right heart pressures. Normal PA saturation. Normal PVR.  3. 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  Plan:  1. Discussed with Dr. Royann Shivers, will contact [facility] for evaluation of BI-V pacing upgrade.  2. Continue heart failure treatment, however, she appears adequately diuresed at this point and can  lay flat without difficulty.  The case and results was discussed with the patient (and family).  The case and results was discussed with the patient's PCP.  The case and results was discussed with the patient's Cardiologist.     Impression/Recommendations 1. Afib - currently rated controlled on dilt gtt, prelim read on echo shows her LV function has improved and appears at least low normal, diltiazem is fine to continue for her. - start oral dilt with plans to wean dilt gtt - discussed anticoag in detail, she had labile INRs on coumadin with troubles with knee hemarthrosis, falls more related to before she had her pacemaker but none since - CHADS2Vasc score is 6 (CHF, HTN, DM, age x 2 points, gender) consistent with nearly 10% annual risk of stroke per year. She is considering possibly NOACs with her family, she does not want to go back on coumadin. Await there decision.   2. Heart block - medtronic CRT-P - normal device check at last outpatient appointment 04/2013, remote check 07/2013 normal function  3. NICM - cath 10/2011 with patent coronaries - thought to be secondary chronic RV pacing in 10/2011, since that time CRT-P placed - denies any current SOB, no evidence of volume overload - continue current medical therapy, f/u repeat echo as she has not had one since CRT-P placed.   4. HTN - stop norvasc now that she is on dilt, continue other meds.    Dina Rich, M.D.

## 2013-10-04 NOTE — H&P (Signed)
[facility] History and Physical  Kathleen [name] XLK:[id number] DOB: [birth year] DOA: 10/04/2013  Referring physician:  PCP: [name]., MD   Chief Complaint: generalized weakness/palpitation  HPI: Kathleen [name] is a very pleasant 80 y.o. female with a past medical history that includes diabetes, hypertension, paroxysmal A. fib in 2012, CAD, second degree AV block status post pacemaker 2013, combination diastolic systolic heart failure chronic kidney disease stage III presents to the emergency department with chief complaint of generalized weakness and palpitations. Initial evaluation in the emergency department reveals atrial fibrillation with a rapid ventricular response and hypoxia.  Information is obtained from the patient and her daughter who is at the bedside. She reports she awakened this morning ambulated to the bathroom and back to bed and immediately felt "my heart beating fast". She reports having intermittent short episodes of same but indicated that this episode was continuous. She denies any chest pain shortness of breath, dizziness syncope or near-syncope. She denies any abdominal pain nausea vomiting. She denies any orthopnea or lower extremity edema. He does indicate that she just finished 2 courses of Cipro for urinary tract infection diagnosed by her PCP about a month ago. She does indicate some continued pain with urination particularly near the end of voiding. She denies fever chills or recent sick contacts. He also reports a chronic dry cough which she relates to her chronic bronchitis.  Workup in the emergency department includes a basic metabolic panel that significant for creatinine of 1.23 and a serum glucose of 149. Initial troponin is negative complete blood count significant for RBCs of 3.71, chest x-ray revealed Cardiomegaly with mild vascular congestion and background COPD/emphysema with apical pleural parenchymal scarring. EKG yields atrial  fibrillation with a right bundle branch block and repolarization suggestive of ischemia.  While in the emergency department she has an episode of hypoxia 1 her oxygen saturation levels dropped 84% on room air. She is afebrile hemodynamically stable. She is given 500 cc of normal saline intravenously and 10 mg of diltiazem intravenously and diltiazem drip is initiated as well.  Review of Systems:   10 point review of systems completed and all systems are negative except as indicated in the history of present illness Past Medical History  Diagnosis Date  . Hypertension   . Diabetes mellitus   . High cholesterol   . Atrial fibrillation   . Paroxysmal a-fib 12/13/2010  . PNA (pneumonia)   . CAD (coronary artery disease) 2006  . Acid reflux   . AV block, 2nd degree     S/p Pacemaker 03/2011  . CHF (congestive heart failure)   . Shortness of breath   . Pacemaker 10/16/2011    medtronic  . Nonischemic cardiomyopathy   . Chronic kidney disease (CKD), stage III (moderate)    Past Surgical History  Procedure Laterality Date  . Replacement total knee    . Bladder tact  1970's  . Pacemaker insertion  10/16/2011    Medtronic  . Cholecystectomy  1999  . Bladder tack    . Nm myocar perf wall motion  03/15/2009    Normal  . Cardiac catheterization  10/15/2011    Normal coronaries   Social History:  reports that she has never smoked. She has never used smokeless tobacco. She reports that she does not drink alcohol or use illicit drugs. She ambulates without assistance no recent falls. She currently lives with her daughter and is fairly independent with ADLs Allergies  Allergen Reactions  . Coumadin [Warfarin  Sodium] Other (See Comments)    Caused Patient to Bleed Out.   . Meloxicam Other (See Comments)    Unknown  . Morphine Itching    Not in right state of mind.   . Sulfonamide Derivatives Hives    Family History  Problem Relation Age of Onset  . Heart murmur Daughter   . Suicidality  Mother   . Heart attack Brother      Prior to Admission medications   Medication Sig Start Date End Date Taking? Authorizing Provider  albuterol (PROVENTIL HFA;VENTOLIN HFA) 108 (90 BASE) MCG/ACT inhaler Inhale 2 puffs into the lungs every 6 (six) hours as needed for wheezing or shortness of breath.   Yes Historical Provider, MD  amLODipine (NORVASC) 2.5 MG tablet Take 2.5 mg by mouth daily.   Yes Historical Provider, MD  aspirin EC 325 MG tablet Take 81 mg by mouth daily.    Yes Historical Provider, MD  carvedilol (COREG) 25 MG tablet Take 25 mg by mouth 2 (two) times daily with a meal. 03/16/13  Yes Mihai Croitoru, MD  cholecalciferol (VITAMIN D) 1000 UNITS tablet Take 1,000 Units by mouth daily.   Yes Historical Provider, MD  docusate sodium (COLACE) 100 MG capsule Take 100 mg by mouth 2 (two) times daily as needed for mild constipation.    Yes Historical Provider, MD  glimepiride (AMARYL) 2 MG tablet Take 1 mg by mouth daily as needed (if blood sugar is over 120).  08/24/12  Yes Historical Provider, MD  losartan (COZAAR) 100 MG tablet Take 50 mg by mouth daily.    Yes Historical Provider, MD  Multiple Vitamins-Minerals (CENTRUM SILVER PO) Take 1 tablet by mouth daily.   Yes Historical Provider, MD  simvastatin (ZOCOR) 40 MG tablet Take 1 tablet (40 mg total) by mouth at bedtime. 04/06/13  Yes Mihai Croitoru, MD   Physical Exam: Filed Vitals:   10/04/13 1000 10/04/13 1030 10/04/13 1100 10/04/13 1130  BP: 136/63 127/66 128/67 123/67  Pulse: 66 111 67 80  Temp:      TempSrc:      Resp: 19  16 16   Height:      Weight:      SpO2: 84% 91% 91% 94%    Wt Readings from Last 3 Encounters:  10/04/13 76.204 kg (168 lb)  04/29/13 74.662 kg (164 lb 9.6 oz)  10/19/12 71.85 kg (158 lb 6.4 oz)    General:  Appears calm and comfortable well-nourished slightly pale Eyes: PERRL, normal lids, irises & conjunctiva ENT: grossly normal hearing, weakness membranes of her mouth somewhat pink slightly  dry Neck: no LAD, masses or thyromegaly Cardiovascular: Irregularly irregular, no m/r/g. No LE edema. Pedal pulses present and palpable Telemetry: A. fib at a rate of 98 Respiratory: CTA bilaterally, slightly distant. Refine crackling at right base. Hear no wheeze Normal respiratory effort. Abdomen: soft, ntnd positive bowel sounds throughout no guarding Skin: no rash or induration seen on limited exam Musculoskeletal: grossly normal tone BUE/BLE Psychiatric: grossly normal mood and affect, speech fluent and appropriate Neurologic: grossly non-focal.speech is clear facial symmetry cranial nerves II through XII grossly intact           Labs on Admission:  Basic Metabolic Panel:  Recent Labs Lab 10/04/13 0856  NA 139  K 3.8  CL 99  CO2 28  GLUCOSE 149*  BUN 21  CREATININE 1.23*  CALCIUM 9.6   Liver Function Tests:  Recent Labs Lab 10/04/13 0856  AST 22  ALT 16  ALKPHOS 85  BILITOT 0.5  PROT 7.5  ALBUMIN 3.8   No results found for this basename: LIPASE, AMYLASE,  in the last 168 hours No results found for this basename: AMMONIA,  in the last 168 hours CBC:  Recent Labs Lab 10/04/13 0856  WBC 7.3  NEUTROABS 4.7  HGB 12.5  HCT 35.8*  MCV 96.5  PLT 234   Cardiac Enzymes:  Recent Labs Lab 10/04/13 0856  TROPONINI <0.30    BNP (last 3 results)  Recent Labs  10/04/13 0856  PROBNP 398.6   CBG: No results found for this basename: GLUCAP,  in the last 168 hours  Radiological Exams on Admission: Dg Chest Portable 1 View  10/04/2013   CLINICAL DATA:  TACHYCARDIA  EXAM: PORTABLE CHEST - 1 VIEW  COMPARISON:  10/17/2011  FINDINGS: Left subclavian 2 lead pacer noted. Mild cardiomegaly with slight vascular congestion compared to the prior study. Background COPD/ emphysema noted with apical pleural parenchymal scarring. No definite focal pneumonia, collapse or consolidation. No effusion or pneumothorax. Bones are osteopenic.  IMPRESSION: Cardiomegaly with mild  vascular congestion  Background COPD/emphysema with apical pleural parenchymal scarring.   Electronically Signed   By: Ruel Favors M.D.   On: 10/04/2013 08:54    EKG: Independently reviewed. Atrial fibrillation with right bundle branch block  Assessment/Plan Principal Problem:   Atrial fibrillation with RVR; etiology unclear. Recent U/A, hx DOE and CHF. Chest xray as above. Chart review indicates history of paroxysmal A. Fib and she refused coumadin at that time. Will admit to SD. Will continue cardizem drip for rate control. Will cycle troponin and obtain 2decho. Check TSH. Continue home BB as well and provide aspirin. Request cardiology consult. Request interrogation of pacemaker.  Of note, chart review indicated evaluated 4/15 by Dr Royann Shivers and he evaluated pacemaker and noted single brief episode of paroxysmal atrial tachycardia lasting 11 secs at 135 beats per minute.  Active Problems: Hypoxia: in pt with hx chronic bronchitis and DOE. Resolved at time of my exam. Chest xray with mild vascular congestion and COPD changes. Will monitor and provide oxygen supplementation as indicated. Continue home inhaler    Generalized weakness: onset this am: may be related to #1. Will obtain u/a. She is currently afebrile and non-toxic appearing. Will request PT evaluation tomorrow.   Cardiomyopathy, dilated, nonischemic: Dr Royann Shivers in April 2015 indicates high grade second degree atrioventicularl block . Note indicates diagnosed following implantation of dual chamber permanent pacemaker with pacing induced ventricular asynchrony secondary to high grade atrioventricular block.  Will obtain 2 decho    Chronic combined systolic and diastolic CHF (congestive heart failure): see #1. Appears euvolemic. Home meds include carvedilol norvasc and cozaar. Not on diuretics. Hold BB and ARB. BP 123/67. She has not taken home meds today.     CKD (chronic kidney disease) stage 3, GFR 30-59 ml/min: appears stable at  baseline       DM type 2 (diabetes mellitus, type 2): takes amaryl daily if am CBG >120. Will hold this for now. Will provide SSI and carb modified diet for optimal control. Will obtain HgA1c    Hyperlipidemia: obtain lipid panel    Symptomatic advanced heart block, 2:1 AV block, RT BBB, Lt post. fas. block.(March 2013): see above. Will request interrogation of pacer.    COPD on CXR: stable at baseline. Continue home inhaler  HTN: controlled. Will hold BB and ARB and norvasc      Dr. Wyline Mood: consult requested  Code Status: full  DVT Prophylaxis: Family Communication: daughter at bedside  Disposition Plan: home when ready  Time spent: 18 minutes  [facility] Pager [phone]

## 2013-10-04 NOTE — ED Notes (Signed)
Pt reports got up to the restroom this morning and when went back to bed, felt like heart was beating too fast.  Reports has a pacemaker.  Denies chest pain, dizziness, or SOB.  Reports bp at home was 120/77, cbg 111, HR 115.

## 2013-10-05 DIAGNOSIS — I4891 Unspecified atrial fibrillation: Secondary | ICD-10-CM | POA: Diagnosis not present

## 2013-10-05 DIAGNOSIS — N390 Urinary tract infection, site not specified: Secondary | ICD-10-CM

## 2013-10-05 LAB — BASIC METABOLIC PANEL
Anion gap: 13 (ref 5–15)
BUN: 23 mg/dL (ref 6–23)
CALCIUM: 9 mg/dL (ref 8.4–10.5)
CO2: 25 mEq/L (ref 19–32)
Chloride: 99 mEq/L (ref 96–112)
Creatinine, Ser: 1.17 mg/dL — ABNORMAL HIGH (ref 0.50–1.10)
GFR calc Af Amer: 47 mL/min — ABNORMAL LOW (ref >90)
GFR calc non Af Amer: 41 mL/min — ABNORMAL LOW (ref >90)
GLUCOSE: 208 mg/dL — AB (ref 70–99)
POTASSIUM: 3.8 meq/L (ref 3.7–5.3)
Sodium: 137 mEq/L (ref 137–147)

## 2013-10-05 LAB — GLUCOSE, CAPILLARY
Glucose-Capillary: 192 mg/dL — ABNORMAL HIGH (ref 70–99)
Glucose-Capillary: 221 mg/dL — ABNORMAL HIGH (ref 70–99)

## 2013-10-05 MED ORDER — DILTIAZEM HCL 30 MG PO TABS: 30.0000 mg | ORAL_TABLET | Freq: Two times a day (BID) | ORAL | Status: DC

## 2013-10-05 MED ORDER — CEPHALEXIN 250 MG PO CAPS: 250.0000 mg | ORAL_CAPSULE | Freq: Three times a day (TID) | ORAL | Status: DC

## 2013-10-05 MED ORDER — DILTIAZEM HCL 30 MG PO TABS
30.0000 mg | ORAL_TABLET | Freq: Two times a day (BID) | ORAL | Status: DC
  Filled 2013-10-05: qty 1

## 2013-10-05 MED ORDER — CEPHALEXIN 250 MG PO CAPS
250.0000 mg | ORAL_CAPSULE | Freq: Three times a day (TID) | ORAL | Status: DC
  Filled 2013-10-05 (×7): qty 1

## 2013-10-05 NOTE — Progress Notes (Signed)
Patient ID: Kathleen [name], female   DOB: 12/30/1924, 80 y.o.   MRN: [id number]     Subjective:    No complaints this AM   Objective:   Temp:  [98.2 F (36.8 C)-98.6 F (37 C)] 98.4 F (36.9 C) (09/23 0800) Pulse Rate:  [49-116] 95 (09/23 0800) Resp:  [16-24] 20 (09/23 0600) BP: (84-136)/(51-78) 84/56 mmHg (09/23 0800) SpO2:  [84 %-96 %] 96 % (09/23 0800) Weight:  [156 lb 4.9 oz (70.9 kg)-157 lb 3 oz (71.3 kg)] 156 lb 4.9 oz (70.9 kg) (09/23 0443) Last BM Date: 10/02/13  Filed Weights   10/04/13 0831 10/04/13 1227 10/05/13 0443  Weight: 168 lb (76.204 kg) 157 lb 3 oz (71.3 kg) 156 lb 4.9 oz (70.9 kg)    Intake/Output Summary (Last 24 hours) at 10/05/13 0910 Last data filed at 10/05/13 0300  Gross per 24 hour  Intake    240 ml  Output   1300 ml  Net  -1060 ml    Telemetry: afib, rates 60-90s  Exam:  General: NAD  Resp: CTAB  Cardiac: irreg, no m/r/g, no JVD  GI: abdomen soft, NT, ND  MSK: no LE edema  Neuro: no focal deficits  Psych: appropriate affect  Lab Results:  Basic Metabolic Panel:  Recent Labs Lab 10/04/13 0843 10/04/13 0856 10/05/13 0428  NA  --  139 137  K  --  3.8 3.8  CL  --  99 99  CO2  --  28 25  GLUCOSE  --  149* 208*  BUN  --  21 23  CREATININE  --  1.23* 1.17*  CALCIUM  --  9.6 9.0  MG 2.1  --   --     Liver Function Tests:  Recent Labs Lab 10/04/13 0856  AST 22  ALT 16  ALKPHOS 85  BILITOT 0.5  PROT 7.5  ALBUMIN 3.8    CBC:  Recent Labs Lab 10/04/13 0856  WBC 7.3  HGB 12.5  HCT 35.8*  MCV 96.5  PLT 234    Cardiac Enzymes:  Recent Labs Lab 10/04/13 0856 10/04/13 1523  TROPONINI <0.30 <0.30    BNP:  Recent Labs  10/04/13 0856  PROBNP 398.6    Coagulation: No results found for this basename: INR,  in the last 168 hours  ECG:   Medications:   Scheduled Medications: . aspirin EC  81 mg Oral Daily  . atorvastatin  20 mg Oral q1800  . diltiazem  30 mg Oral 4 times per day  .  enoxaparin (LOVENOX) injection  40 mg Subcutaneous Q24H  . insulin aspart  0-9 Units Subcutaneous TID WC  . sodium chloride  3 mL Intravenous Q12H     Infusions: . diltiazem (CARDIZEM) infusion Stopped (10/04/13 1637)     PRN Medications:  sodium chloride, acetaminophen, acetaminophen, albuterol, alum & mag hydroxide-simeth, docusate sodium, HYDROcodone-acetaminophen, ondansetron (ZOFRAN) IV, ondansetron, sodium chloride     Assessment/Plan    1. Afib  - off dilt drip, transitioned to short acting oral dilt  q 6 hrs with hold parameters, received 2 doses yesterady. Coreg continued at  bid. Some soft blood pressures this AM. Will change short acting dilt to  bid, continue this dose at discharge.  - discussed anticoag in detail, she had labile INRs on coumadin with troubles with knee hemarthrosis, falls more related to before she had her pacemaker but none since  - CHADS2Vasc score is 6 (CHF, HTN, DM, age x 2 points, gender) consistent with  nearly 10% annual risk of stroke per year. She is considering possibly NOACs with her family, but wishes to hold off at this time and readress at follow up. Continue ASA.   2. Heart block  - medtronic CRT-P  - normal device check at last outpatient appointment 04/2013, remote check 07/2013 normal function   3. NICM  - cath 10/2011 with patent coronaries  - thought to be secondary chronic RV pacing in 10/2011, since that time CRT-P placed  - repeat echo with now normalized LVEF - denies any current SOB, no evidence of volume overload  - continue current medical therapy, f/u repeat echo as she has not had one since CRT-P placed.   4. HTN  - stopped norvasc since she is now on dilt. Losartan on her home MAR, not started on admission. Would not restart this time due to soft bp's, follow bp's on dilt. Can consider restarting low dose ARB at follow up.     Ok for discharge from cardiology standpoint, I have contacted our office to  arrange f/u with NP Lyman Bishop in 1 week. She is also to decide whether to transfer her care to our [place] office as opposed to [facility], M.D

## 2013-10-05 NOTE — Progress Notes (Signed)
UR chart review completed.  

## 2013-10-05 NOTE — Evaluation (Signed)
Physical Therapy Evaluation Patient Details Name: Kathleen [name] MRN: [id number] DOB: 02/05/1924 Today's Date: 10/05/2013   History of Present Illness  Kathleen [name] is a very pleasant 80 y.o. female with a past medical history that includes diabetes, hypertension, paroxysmal A. fib in 2012, CAD, second degree AV block status post pacemaker 2013, combination diastolic systolic heart failure chronic kidney disease stage III presents to the emergency department with chief complaint of generalized weakness and palpitations. Initial evaluation in the emergency department reveals atrial fibrillation with a rapid ventricular response and hypoxia.  Information is obtained from the patient and her daughter who is at the bedside. She reports she awakened this morning ambulated to the bathroom and back to bed and immediately felt "my heart beating fast". She reports having intermittent short episodes of same but indicated that this episode was continuous. She denies any chest pain shortness of breath, dizziness syncope or near-syncope. She denies any abdominal pain nausea vomiting. She denies any orthopnea or lower extremity edema. He does indicate that she just finished 2 courses of Cipro for urinary tract infection diagnosed by her PCP about a month ago. She does indicate some continued pain with urination particularly near the end of voiding. She denies fever chills or recent sick contacts. He also reports a chronic dry cough which she relates to her chronic bronchitis.  Clinical Impression  Pt is an 80 year old female who presents to PT for assessment of functional mobility skills.  During evaluation, pt was (I) with bed mobility skills, mod (I) for transfers, and supervision/mod (I) for gait of 30 feet without AD.  Gait distance limited by fatigue with activity.  No LOB during gait, and pt does not report any falls at home.  Pt currently at baseline level of function and will be d/c from acute PT  services; recommend continued PT with HHPT to address strengthening and activity tolerance for improved safety with household and community obstacles.  No DME recommendations.  Pt/family were educated on possible use of cane at home, due to low activity tolerance if pt is easily fatigued as the day goes on for safe functional mobility skills.  Pt/family verbalize understanding, and daughter reports she is available 24/7 to assist as needed.      Follow Up Recommendations Home health PT    Equipment Recommendations  None recommended by PT       Precautions / Restrictions Precautions Precautions: Fall Restrictions Weight Bearing Restrictions: No      Mobility  Bed Mobility Overal bed mobility: Independent                Transfers Overall transfer level: Modified independent                  Ambulation/Gait Ambulation/Gait assistance: Supervision;Modified independent (Device/Increase time) Ambulation Distance (Feet): 30 Feet Assistive device: None Gait Pattern/deviations: Decreased dorsiflexion - right;Decreased dorsiflexion - left   Gait velocity interpretation: Below normal speed for age/gender General Gait Details: Gait distance limited secondary to fatigue.       Balance Overall balance assessment: No apparent balance deficits (not formally assessed)                                           Pertinent Vitals/Pain Pain Assessment: No/denies pain    Home Living Family/patient expects to be discharged to:: Private residence Living Arrangements: Children (Lives with  daughter) Available Help at Discharge: Family;Available 24 hours/day Type of Home: House Home Access: Stairs to enter Entrance Stairs-Rails: None (Can reach doorframe) Entrance Stairs-Number of Steps: 1 step to the back door Home Layout: One level Home Equipment: Bedside commode;Walker - 2 wheels;Cane - single point;Shower seat - built in;Hand held shower head;Grab bars -  tub/shower Additional Comments: Walk In [employer]    Prior Function Level of Independence: Independent                  Extremity/Trunk Assessment               Lower Extremity Assessment: Generalized weakness         Communication   Communication: No difficulties  Cognition Arousal/Alertness: Awake/alert Behavior During Therapy: WFL for tasks assessed/performed Overall Cognitive Status: Within Functional Limits for tasks assessed                        Assessment/Plan    PT Assessment Patient needs continued PT services;All further PT needs can be met in the next venue of care  PT Diagnosis Generalized weakness   PT Problem List Decreased strength;Decreased activity tolerance;Decreased mobility  PT Treatment Interventions     PT Goals (Current goals can be found in the Care Plan section) Acute Rehab PT Goals PT Goal Formulation: No goals set, d/c therapy     End of Session Equipment Utilized During Treatment: Gait belt Activity Tolerance: Patient limited by fatigue Patient left: in bed;with call bell/phone within reach;with family/visitor present           Time: 5053-9767 PT Time Calculation (min): 18 min   Charges:   PT Evaluation $Initial PT Evaluation Tier I: 1 Procedure     Sofija Antwi 10/05/2013, 11:30 AM

## 2013-10-05 NOTE — Discharge Summary (Signed)
I have directly reviewed the clinical findings, lab, imaging studies and management of this patient in detail. I have interviewed and examined the patient and agree with the documentation,  as recorded by the Physician extender, Ms. Toya Smothers, NP.  In summary This is an 80 year old female history of diabetes, hypertension, paroxysmal atrial fibrillation diagnosed in 2012 that presented to the emergency department with complaints of palpitations and generalized weakness. While in the emergency department, patient was found to have atrial fibrillation with RVR. Cardiology was consulted and place patient on Cardizem drip, eventually she was transitioned to Cardizem by mouth. Norvasc, Coreg, losartan were discontinued. Patient does have a chads Vascor at 6, however does not wish to be on anticoagulation at this time. The risks and benefits were reviewed both with the patient as well as her family, they would like to think about it. Cardiology also agreed, that patient may follow up with them as an outpatient regarding her anticoagulation. Patient did have echocardiogram showing mild LVH with an EF 55-60%. Her weakness is also thought to be secondary to her atrial fibrillation with RVR. Patient does have other chronic medical conditions however does have remained stable during her hospital course. Of note patient was treated with antibiotics for urinary tract infection approximately 2 weeks ago. Patient continued to have urinary complaints, UA was positive for infection. Patient will be discharged with Keflex for 10 days. Patient should follow up with cardiology as well as her primary care physician within one to 2 weeks of discharge.  Patient was seen examined on day of discharge General: Well-developed, well-nourished, no apparent distress Cardiovascular: Irregular, S1, S2 auscultated Respiratory: Clear to auscultation bilaterally. Psychiatric: Appropriate mood and affect, intact judgment and  insight Neurologic: Alert and oriented x3, no focal deficits  Total patient care time greater than 40 minutes  Fleming Prill D.O. on 10/05/2013 at 1:11 PM  [facility] Office  [phone]

## 2013-10-05 NOTE — Discharge Summary (Signed)
Physician Discharge Summary  KELTY SZAFRAN ZOX:[id number] DOB: [birth year] DOA: 10/04/2013  PCP: [name]., MD  Admit date: 10/04/2013 Discharge date: 10/05/2013  Time spent: 40 minutes  Recommendations for Outpatient Follow-up:  1. Cardiology office to contact you to schedule appointment with Harriet Pho NP for 1 week for evaluation of a fib new onset and BP control given recent change in medications. Also discuss anticoagulation as patient/family had not decided at time of discharge.  2. Home health PT for strength and endurence 3. Follow up with PCP 2 weeks for evaluation of resolution of UTI.   Discharge Diagnoses:  Principal Problem:   Atrial fibrillation with RVR Active Problems:   DM type 2 (diabetes mellitus, type 2)   Hyperlipidemia   Symptomatic advanced heart block, 2:1 AV block, RT BBB, Lt post. fas. block.(March 2013)   COPD on CXR   Cardiomyopathy, dilated, nonischemic   Chronic combined systolic and diastolic CHF (congestive heart failure)   CKD (chronic kidney disease) stage 3, GFR 30-59 ml/min   Hypoxia   Generalized weakness   UTI (urinary tract infection)   Discharge Condition: stable  Diet recommendation: heart healthy carb modified  Filed Weights   10/04/13 0831 10/04/13 1227 10/05/13 0443  Weight: 76.204 kg (168 lb) 71.3 kg (157 lb 3 oz) 70.9 kg (156 lb 4.9 oz)    History of present illness:  Kathleen [name] is a very pleasant 80 y.o. female with a past medical history that includes diabetes, hypertension, paroxysmal A. fib in 2012, CAD, second degree AV block status post pacemaker 2013, combination diastolic systolic heart failure chronic kidney disease stage III presented to the emergency department on 10/04/13 with chief complaint of generalized weakness and palpitations. Initial evaluation in the emergency department revealed atrial fibrillation with a rapid ventricular response and hypoxia.   Information is obtained from the patient and  her daughter who is at the bedside. She reported she awakened in morning ambulated to the bathroom and back to bed and immediately felt "my heart beating fast". She reported having intermittent short episodes of same in past but indicated that this episode was continuous. She denied any chest pain shortness of breath, dizziness syncope or near-syncope. She denied any abdominal pain nausea vomiting. She denied any orthopnea or lower extremity edema. He did indicate that she just finished 2 courses of Cipro for urinary tract infection diagnosed by her PCP about a month prior. She did indicate some continued pain with urination particularly near the end of voiding. She denied fever chills or recent sick contacts. sHe also reported a chronic dry cough which she related to her chronic bronchitis.   Workup in the emergency department included a basic metabolic panel that significant for creatinine of 1.23 and a serum glucose of 149. Initial troponin was negative complete blood count significant for RBCs of 3.71, chest x-ray revealed Cardiomegaly with mild vascular congestion and background COPD/emphysema with apical pleural parenchymal scarring. EKG yielded atrial fibrillation with a right bundle branch block and repolarization suggestive of ischemia.   While in the emergency department she had an episode of hypoxia  her oxygen saturation levels dropped 84% on room air. She was afebrile hemodynamically stable. She was given 500 cc of normal saline intravenously and 10 mg of diltiazem intravenously and diltiazem drip is initiated as well.   Hospital Course:  Atrial fibrillation with RVR;  Admitted to [facility] on diltiazem gtt. Rate quickly became controlled ant she was transitioned to po diltiazem.  Evaluated by cardiology  who recommend diltiazem po  BID for rate control. Home norvasc dicontinued and home losartan held at discharge as BP somewhat soft. Recommend evaluation at follow up need to resume Losartan.  Troponin negative x3. 2decho with EF 35-40% with diffuse hypokinesis, severe hypokinesis of inferior and apical myocardium. TSH within the limits of normal. Continue aspirin. Per cards note CHADS2Vasc score is 6 (CHF, HTN, DM, age x 2 points, gender) consistent with nearly 10% annual risk of stroke per year. She is considering possibly NOACs with her family, she does not want to go back on coumadin. Will readdress at follow up   Active Problems:   Hypoxia: in pt with hx chronic bronchitis and DOE. Resolved at time of my exam in ED. Chest xray with mild vascular congestion and COPD changes. No further episodes   Generalized weakness: may be related to #1. She remained afebrile and non-toxic appearing. Evaluated by PT who recommended HH PT.   Cardiomyopathy, dilated, nonischemic: cath 10/2011 with patent coronaries per Cardiology note. CRT. No sob or chest pain. Echo as above  Chronic combined systolic and diastolic CHF (congestive heart failure): see #1. remained euvolemic. See above for medication a   CKD (chronic kidney disease) stage 3, GFR 30-59 ml/min: remained stable at baseline   DM type 2 (diabetes mellitus, type 2): takes amaryl daily if am CBG >120. HgA1c 8.2  Hyperlipidemia: lipid panel within limits of normal  Symptomatic advanced heart block, 2:1 AV block, RT BBB, Lt post. fas. block.(March 2013): see above.    COPD on CXR: remained stable at baseline. Continue home inhaler   HTN: slightly soft at discharge. Discharged on diltiazem for #1 and home norvasc discontinued. Cards recommended evaluation of BP at follow up to determine for need to resume Losartan  UTI: keflex for 10 days. Of note, just completed 2 rounds of antibiotics for UTI prior to admission. Follow up with PCP 2 weeks to ensure resolution of UTI  Procedures:  2 decho revealed wall thickness was increased in a pattern of mild LVH. Systolic function was normal. The estimated ejection fraction was in the range of  55% to 60%    Consultations:  Dr Wyline Mood cardiology  Discharge Exam: Filed Vitals:   10/05/13 0900  BP:   Pulse: 84  Temp:   Resp: 17    General: up in chair appears comfortable Cardiovascular: irregularly irregular, no MGR No LE edema PPP Respiratory: normal effort BS clear bilaterally no wheeze no crackles  Discharge Instructions You were cared for by a hospitalist during your hospital stay. If you have any questions about your discharge medications or the care you received while you were in the hospital after you are discharged, you can call the unit and asked to speak with the hospitalist on call if the hospitalist that took care of you is not available. Once you are discharged, your primary care physician will handle any further medical issues. Please note that NO REFILLS for any discharge medications will be authorized once you are discharged, as it is imperative that you return to your primary care physician (or establish a relationship with a primary care physician if you do not have one) for your aftercare needs so that they can reassess your need for medications and monitor your lab values.  Discharge Instructions   Diet - low sodium heart healthy    Complete by:  As directed      Discharge instructions    Complete by:  As directed   Take medication  as directed     Increase activity slowly    Complete by:  As directed           Current Discharge Medication List    START taking these medications   Details  cephALEXin (KEFLEX) 250 MG capsule Take 1 capsule (250 mg total) by mouth every 8 (eight) hours. Qty: 30 capsule, Refills: 0    diltiazem (CARDIZEM) 30 MG tablet Take 1 tablet (30 mg total) by mouth every 12 (twelve) hours. Qty: 60 tablet, Refills: 0      CONTINUE these medications which have NOT CHANGED   Details  albuterol (PROVENTIL HFA;VENTOLIN HFA) 108 (90 BASE) MCG/ACT inhaler Inhale 2 puffs into the lungs every 6 (six) hours as needed for wheezing or  shortness of breath.    aspirin EC 325 MG tablet Take 81 mg by mouth daily.     cholecalciferol (VITAMIN D) 1000 UNITS tablet Take 1,000 Units by mouth daily.    docusate sodium (COLACE) 100 MG capsule Take 100 mg by mouth 2 (two) times daily as needed for mild constipation.     glimepiride (AMARYL) 2 MG tablet Take 1 mg by mouth daily as needed (if blood sugar is over 120).     Multiple Vitamins-Minerals (CENTRUM SILVER PO) Take 1 tablet by mouth daily.    simvastatin (ZOCOR) 40 MG tablet Take 1 tablet (40 mg total) by mouth at bedtime. Qty: 30 tablet, Refills: 7      STOP taking these medications     amLODipine (NORVASC) 2.5 MG tablet      carvedilol (COREG) 25 MG tablet      losartan (COZAAR) 100 MG tablet        Allergies  Allergen Reactions  . Coumadin [Warfarin Sodium] Other (See Comments)    Caused Patient to Bleed Out.   . Meloxicam Other (See Comments)    Unknown  . Morphine Itching    Not in right state of mind.   . Sulfonamide Derivatives Hives   Follow-up Information   Follow up with [name]., MD. Schedule an appointment as soon as possible for a visit in 2 weeks. (for follow up on symptoms)    Specialty:  Internal Medicine   Contact information:   [address] [place] [address] [phone]       Follow up with Joni Reining, NP. (office will contact you to schedule appointment for 1 week)    Specialty:  Nurse Practitioner   Contact information:   [address] [place] [address] [phone]        The results of significant diagnostics from this hospitalization (including imaging, microbiology, ancillary and laboratory) are listed below for reference.    Significant Diagnostic Studies: Dg Chest Portable 1 View  10/04/2013   CLINICAL DATA:  TACHYCARDIA  EXAM: PORTABLE CHEST - 1 VIEW  COMPARISON:  10/17/2011  FINDINGS: Left subclavian 2 lead pacer noted. Mild cardiomegaly with slight vascular congestion compared to the  prior study. Background COPD/ emphysema noted with apical pleural parenchymal scarring. No definite focal pneumonia, collapse or consolidation. No effusion or pneumothorax. Bones are osteopenic.  IMPRESSION: Cardiomegaly with mild vascular congestion  Background COPD/emphysema with apical pleural parenchymal scarring.   Electronically Signed   By: Ruel Favors M.D.   On: 10/04/2013 08:54    Microbiology: Recent Results (from the past 240 hour(s))  MRSA PCR SCREENING     Status: None   Collection Time    10/04/13 12:15 PM  Result Value Ref Range Status   MRSA by PCR NEGATIVE  NEGATIVE Final   Comment:            The GeneXpert MRSA Assay (FDA     approved for NASAL specimens     only), is one component of a     comprehensive MRSA colonization     surveillance program. It is not     intended to diagnose MRSA     infection nor to guide or     monitor treatment for     MRSA infections.     Labs: Basic Metabolic Panel:  Recent Labs Lab 10/04/13 0843 10/04/13 0856 10/04/13 1523 10/05/13 0428  NA  --  139  --  137  K  --  3.8  --  3.8  CL  --  99  --  99  CO2  --  28  --  25  GLUCOSE  --  149*  --  208*  BUN  --  21  --  23  CREATININE  --  1.23*  --  1.17*  CALCIUM  --  9.6  --  9.0  MG 2.1  --   --   --   PHOS  --   --  3.3  --    Liver Function Tests:  Recent Labs Lab 10/04/13 0856  AST 22  ALT 16  ALKPHOS 85  BILITOT 0.5  PROT 7.5  ALBUMIN 3.8   No results found for this basename: LIPASE, AMYLASE,  in the last 168 hours No results found for this basename: AMMONIA,  in the last 168 hours CBC:  Recent Labs Lab 10/04/13 0856  WBC 7.3  NEUTROABS 4.7  HGB 12.5  HCT 35.8*  MCV 96.5  PLT 234   Cardiac Enzymes:  Recent Labs Lab 10/04/13 0856 10/04/13 1523  TROPONINI <0.30 <0.30   BNP: BNP (last 3 results)  Recent Labs  10/04/13 0856  PROBNP 398.6   CBG:  Recent Labs Lab 10/04/13 1633 10/04/13 2100 10/05/13 0736 10/05/13 1140  GLUCAP  225* 196* 192* 221*       Signed:  Demitra Danley M  [facility] 10/05/2013, 12:11 PM

## 2013-10-05 NOTE — Progress Notes (Signed)
Patient with orders to be discharge home. Discharge instructions given, patient verbalized understanding. Patient stable. Patient left with family in private vehicle.  

## 2013-10-05 NOTE — Care Management Note (Signed)
    Page 1 of 1   10/05/2013     12:41:46 PM CARE MANAGEMENT NOTE 10/05/2013  Patient:  LAYCIE, HAUSE   Account Number:  [id number]  Date Initiated:  10/05/2013  Documentation initiated by:  Sharrie Rothman  Subjective/Objective Assessment:   Pt admitted from home with a fib. Pt lives with her daughter and will return home at discharge. Pt is fairly independent with ADL's. Pt has a cane and walker for home use.     Action/Plan:   PT recommends home health PT. Pt chooses [facility] and Alroy Bailiff of [facility] is aware and will collect the pts information from the chart. HH services to start within 48 hours of discharge.  Pt and pts nurse aware of d/c arrangements.   Anticipated DC Date:  10/05/2013   Anticipated DC Plan:  HOME W HOME HEALTH SERVICES      DC Planning Services  CM consult      Choice offered to / List presented to:             Status of service:  Completed, signed off Medicare Important Message given?   (If response is "NO", the following Medicare IM given date fields will be blank) Date Medicare IM given:   Medicare IM given by:   Date Additional Medicare IM given:   Additional Medicare IM given by:    Discharge Disposition:  HOME W HOME HEALTH SERVICES  Per UR Regulation:    If discussed at Long Length of Stay Meetings, dates discussed:    Comments:  10/05/13 1240 Arlyss Queen, RN BSN CM

## 2013-10-06 ENCOUNTER — Encounter (HOSPITAL_COMMUNITY): Payer: Self-pay | Admitting: Emergency Medicine

## 2013-10-06 ENCOUNTER — Emergency Department (HOSPITAL_COMMUNITY)
Admission: EM | Admit: 2013-10-06 | Discharge: 2013-10-07 | Disposition: A | Discharge status: Home / Self Care | Payer: Medicare Other | Attending: Emergency Medicine | Admitting: Emergency Medicine

## 2013-10-06 DIAGNOSIS — E7800 Pure hypercholesterolemia, unspecified: Secondary | ICD-10-CM | POA: Insufficient documentation

## 2013-10-06 DIAGNOSIS — R002 Palpitations: Secondary | ICD-10-CM | POA: Diagnosis present

## 2013-10-06 DIAGNOSIS — Z950 Presence of cardiac pacemaker: Secondary | ICD-10-CM | POA: Diagnosis not present

## 2013-10-06 DIAGNOSIS — Z9889 Other specified postprocedural states: Secondary | ICD-10-CM | POA: Diagnosis not present

## 2013-10-06 DIAGNOSIS — I509 Heart failure, unspecified: Secondary | ICD-10-CM | POA: Insufficient documentation

## 2013-10-06 DIAGNOSIS — Z79899 Other long term (current) drug therapy: Secondary | ICD-10-CM | POA: Diagnosis not present

## 2013-10-06 DIAGNOSIS — I2510 Atherosclerotic heart disease of native coronary artery without angina pectoris: Secondary | ICD-10-CM | POA: Insufficient documentation

## 2013-10-06 DIAGNOSIS — N183 Chronic kidney disease, stage 3 unspecified: Secondary | ICD-10-CM | POA: Diagnosis not present

## 2013-10-06 DIAGNOSIS — I129 Hypertensive chronic kidney disease with stage 1 through stage 4 chronic kidney disease, or unspecified chronic kidney disease: Secondary | ICD-10-CM | POA: Diagnosis not present

## 2013-10-06 DIAGNOSIS — E119 Type 2 diabetes mellitus without complications: Secondary | ICD-10-CM | POA: Insufficient documentation

## 2013-10-06 DIAGNOSIS — Z8701 Personal history of pneumonia (recurrent): Secondary | ICD-10-CM | POA: Diagnosis not present

## 2013-10-06 DIAGNOSIS — Z8719 Personal history of other diseases of the digestive system: Secondary | ICD-10-CM | POA: Insufficient documentation

## 2013-10-06 DIAGNOSIS — I4891 Unspecified atrial fibrillation: Secondary | ICD-10-CM

## 2013-10-06 DIAGNOSIS — Z7982 Long term (current) use of aspirin: Secondary | ICD-10-CM | POA: Insufficient documentation

## 2013-10-06 DIAGNOSIS — Z792 Long term (current) use of antibiotics: Secondary | ICD-10-CM | POA: Insufficient documentation

## 2013-10-06 LAB — CBC WITH DIFFERENTIAL/PLATELET
Basophils Absolute: 0 10*3/uL (ref 0.0–0.1)
Basophils Relative: 0 % (ref 0–1)
Eosinophils Absolute: 0.2 10*3/uL (ref 0.0–0.7)
Eosinophils Relative: 2 % (ref 0–5)
HEMATOCRIT: 36.3 % (ref 36.0–46.0)
HEMOGLOBIN: 12.5 g/dL (ref 12.0–15.0)
LYMPHS ABS: 2 10*3/uL (ref 0.7–4.0)
Lymphocytes Relative: 26 % (ref 12–46)
MCH: 33.3 pg (ref 26.0–34.0)
MCHC: 34.4 g/dL (ref 30.0–36.0)
MCV: 96.8 fL (ref 78.0–100.0)
MONO ABS: 0.7 10*3/uL (ref 0.1–1.0)
Monocytes Relative: 9 % (ref 3–12)
Neutro Abs: 4.9 10*3/uL (ref 1.7–7.7)
Neutrophils Relative %: 63 % (ref 43–77)
Platelets: 237 10*3/uL (ref 150–400)
RBC: 3.75 MIL/uL — AB (ref 3.87–5.11)
RDW: 13.3 % (ref 11.5–15.5)
WBC: 7.8 10*3/uL (ref 4.0–10.5)

## 2013-10-06 LAB — I-STAT CHEM 8, ED
BUN: 23 mg/dL (ref 6–23)
CHLORIDE: 100 meq/L (ref 96–112)
Calcium, Ion: 1.16 mmol/L (ref 1.13–1.30)
Creatinine, Ser: 1.5 mg/dL — ABNORMAL HIGH (ref 0.50–1.10)
GLUCOSE: 207 mg/dL — AB (ref 70–99)
HEMATOCRIT: 38 % (ref 36.0–46.0)
Hemoglobin: 12.9 g/dL (ref 12.0–15.0)
POTASSIUM: 4.3 meq/L (ref 3.7–5.3)
SODIUM: 133 meq/L — AB (ref 137–147)
TCO2: 33 mmol/L (ref 0–100)

## 2013-10-06 LAB — I-STAT TROPONIN, ED: TROPONIN I, POC: 0.02 ng/mL (ref 0.00–0.08)

## 2013-10-06 MED ORDER — METOPROLOL TARTRATE 1 MG/ML IV SOLN
5.0000 mg | Freq: Once | INTRAVENOUS | Status: AC
  Administered 2013-10-07: 5 mg via INTRAVENOUS
  Filled 2013-10-06: qty 5

## 2013-10-06 NOTE — ED Notes (Signed)
Patient was discharged from hospital yesterday for atrial fibrillation.  Patient c/o chest fullness; states she feels like she's been running.

## 2013-10-06 NOTE — ED Notes (Signed)
MD at bedside. 

## 2013-10-06 NOTE — ED Provider Notes (Signed)
CSN: [id number]     Arrival date & time 10/06/13  2209 History  This chart was scribed for Ward Givens, MD by Tonye Royalty, ED Scribe. This patient was seen in room APA09/APA09 and the patient's care was started at 10:52 PM.    Chief Complaint  Patient presents with  . Palpitations    The history is provided by the patient. No language interpreter was used.    HPI Comments: Kathleen [name] is a 80 y.o. female with history of atrial fibrillation who presents to the Emergency Department complaining of tachycardia and palpitations with onset around 7PM today. She states she was due to take her Cardizem at 8PM, which she states did not achieve significant relief of symptoms. She reports having similar symptoms recently and states she came here 2 days ago for the same symptoms, was hospitalized and received Cardizem drip and medication changes, and was discharged yesterday. She was taken off her losartan and amlodipine and put on the oral cardizem.  She states her heart rate was controlled at time of discharge but not the rhythm. She states she has a 3 wire pacemaker. She denies lightheadedness, dizziness, chest pain, or shortness of breath at rest but does have DOE. She describes a fullness in her chest, but no chest pain.  PCP Dr Sherwood Gambler Cardiology Dr Royann Shivers  Past Medical History  Diagnosis Date  . Hypertension   . Diabetes mellitus   . High cholesterol   . Atrial fibrillation   . Paroxysmal a-fib 12/13/2010  . PNA (pneumonia)   . CAD (coronary artery disease) 2006  . Acid reflux   . AV block, 2nd degree     S/p Pacemaker 03/2011  . CHF (congestive heart failure)   . Shortness of breath   . Pacemaker 10/16/2011    medtronic  . Nonischemic cardiomyopathy   . Chronic kidney disease (CKD), stage III (moderate)    Past Surgical History  Procedure Laterality Date  . Replacement total knee    . Bladder tact  1970's  . Pacemaker insertion  10/16/2011    Medtronic  . Cholecystectomy  1999   . Bladder tack    . Nm myocar perf wall motion  03/15/2009    Normal  . Cardiac catheterization  10/15/2011    Normal coronaries   Family History  Problem Relation Age of Onset  . Heart murmur Daughter   . Suicidality Mother   . Heart attack Brother    History  Substance Use Topics  . Smoking status: Never Smoker   . Smokeless tobacco: Never Used  . Alcohol Use: No   Lives with daughter  OB History   Grav Para Term Preterm Abortions TAB SAB Ect Mult Living                 Review of Systems  Respiratory: Negative for shortness of breath.   Cardiovascular: Positive for palpitations. Negative for chest pain.  Neurological: Negative for light-headedness.  All other systems reviewed and are negative.     Allergies  Coumadin; Meloxicam; Morphine; and Sulfonamide derivatives  Home Medications   Prior to Admission medications   Medication Sig Start Date End Date Taking? Authorizing Provider  albuterol (PROVENTIL HFA;VENTOLIN HFA) 108 (90 BASE) MCG/ACT inhaler Inhale 2 puffs into the lungs every 6 (six) hours as needed for wheezing or shortness of breath.    Historical Provider, MD  aspirin EC 325 MG tablet Take 81 mg by mouth daily.     Historical Provider,  MD  cephALEXin (KEFLEX) 250 MG capsule Take 1 capsule (250 mg total) by mouth every 8 (eight) hours. 10/05/13   Gwenyth Bender, NP  cholecalciferol (VITAMIN D) 1000 UNITS tablet Take 1,000 Units by mouth daily.    Historical Provider, MD  diltiazem (CARDIZEM) 30 MG tablet Take 1 tablet (30 mg total) by mouth every 12 (twelve) hours. 10/05/13   Gwenyth Bender, NP  docusate sodium (COLACE) 100 MG capsule Take 100 mg by mouth 2 (two) times daily as needed for mild constipation.     Historical Provider, MD  glimepiride (AMARYL) 2 MG tablet Take 1 mg by mouth daily as needed (if blood sugar is over 120).  08/24/12   Historical Provider, MD  Multiple Vitamins-Minerals (CENTRUM SILVER PO) Take 1 tablet by mouth daily.    Historical  Provider, MD  simvastatin (ZOCOR) 40 MG tablet Take 1 tablet (40 mg total) by mouth at bedtime. 04/06/13   Mihai Croitoru, MD   BP 132/91  Pulse 125  Temp(Src) 98 F (36.7 C) (Oral)  Resp 23  Ht  (1.702 m)  Wt 168 lb (76.204 kg)  BMI 26.31 kg/m2  SpO2 97%  Vital signs normal heart rate varies between 100-135   Physical Exam  Nursing note and vitals reviewed. Constitutional: She is oriented to person, place, and time. She appears well-developed and well-nourished.  Non-toxic appearance. She does not appear ill. No distress.  HENT:  Head: Normocephalic and atraumatic.  Right Ear: External ear normal.  Left Ear: External ear normal.  Nose: Nose normal. No mucosal edema or rhinorrhea.  Mouth/Throat: Oropharynx is clear and moist and mucous membranes are normal. No dental abscesses or uvula swelling.  Eyes: Conjunctivae and EOM are normal. Pupils are equal, round, and reactive to light.  Neck: Normal range of motion and full passive range of motion without pain. Neck supple.  Cardiovascular: Normal heart sounds.  Exam reveals no gallop and no friction rub.   No murmur heard. Tachycardic, irregularly irregular  Pulmonary/Chest: Effort normal and breath sounds normal. No respiratory distress. She has no wheezes. She has no rhonchi. She has no rales. She exhibits no tenderness and no crepitus.  Abdominal: Soft. Normal appearance and bowel sounds are normal. She exhibits no distension. There is no tenderness. There is no rebound and no guarding.  Musculoskeletal: Normal range of motion. She exhibits no edema and no tenderness.  Moves all extremities well.   Neurological: She is alert and oriented to person, place, and time. She has normal strength. No cranial nerve deficit.  Skin: Skin is warm, dry and intact. No rash noted. No erythema. No pallor.  Psychiatric: She has a normal mood and affect. Her speech is normal and behavior is normal. Her mood appears not anxious.    ED Course   Procedures (including critical care time)  Medications  metoprolol (LOPRESSOR) injection 5 mg (5 mg Intravenous Given 10/07/13 0011)  metoprolol (LOPRESSOR) injection 5 mg (5 mg Intravenous Given 10/07/13 0105)     DIAGNOSTIC STUDIES: Oxygen Saturation is 97% on room air, normal by my interpretation.    COORDINATION OF CARE: 10:52 PM Discussed treatment plan with patient at beside, the patient agrees with the plan and has no further questions at this time.  Recheck at 00:20  patient has had one dose of Lopressor, her heart rate now is in the high 90s to 110 range. Her blood pressure is 147/97. She states she's feeling better. I am going to repeat her Lopressor.  Patient has a pacemaker so I did not need to worry about getting her heart rate too low.  00:34 patient discussed with Dr. Sharl Ma. While patient was in the hospital she was on coreq, Norvasc, and losartan with a blood pressure that was "soft". Her blood pressure now is 147/97 at this point we do not feel she needs to be admitted. I'm going to try to control her heart rate with the Lopressor. We discussed increasing her Cardizem to 30 mg 4 times a day until she can see her cardiologist next week.    Labs Review Results for orders placed during the hospital encounter of 10/06/13  CBC WITH DIFFERENTIAL      Result Value Ref Range   WBC 7.8  4.0 - 10.5 K/uL   RBC 3.75 (*) 3.87 - 5.11 MIL/uL   Hemoglobin 12.5  12.0 - 15.0 g/dL   HCT 07.8  67.5 - 44.9 %   MCV 96.8  78.0 - 100.0 fL   MCH 33.3  26.0 - 34.0 pg   MCHC 34.4  30.0 - 36.0 g/dL   RDW 20.1  00.7 - 12.1 %   Platelets 237  150 - 400 K/uL   Neutrophils Relative % 63  43 - 77 %   Neutro Abs 4.9  1.7 - 7.7 K/uL   Lymphocytes Relative 26  12 - 46 %   Lymphs Abs 2.0  0.7 - 4.0 K/uL   Monocytes Relative 9  3 - 12 %   Monocytes Absolute 0.7  0.1 - 1.0 K/uL   Eosinophils Relative 2  0 - 5 %   Eosinophils Absolute 0.2  0.0 - 0.7 K/uL   Basophils Relative 0  0 - 1 %   Basophils  Absolute 0.0  0.0 - 0.1 K/uL  I-STAT TROPOININ, ED      Result Value Ref Range   Troponin i, poc 0.02  0.00 - 0.08 ng/mL   Comment 3           I-STAT CHEM 8, ED      Result Value Ref Range   Sodium 133 (*) 137 - 147 mEq/L   Potassium 4.3  3.7 - 5.3 mEq/L   Chloride 100  96 - 112 mEq/L   BUN 23  6 - 23 mg/dL   Creatinine, Ser 9.75 (*) 0.50 - 1.10 mg/dL   Glucose, Bld 883 (*) 70 - 99 mg/dL   Calcium, Ion 2.54  9.82 - 1.30 mmol/L   TCO2 33  0 - 100 mmol/L   Hemoglobin 12.9  12.0 - 15.0 g/dL   HCT 64.1  58.3 - 09.4 %    Laboratory interpretation all normal except renal insuffic (stable)     Imaging Review No results found.  Dg Chest Portable 1 View  10/04/2013   CLINICAL DATA:  TACHYCARDIA .  IMPRESSION: Cardiomegaly with mild vascular congestion  Background COPD/emphysema with apical pleural parenchymal scarring.   Electronically Signed   By: Ruel Favors M.D.   On: 10/04/2013 08:54    EKG Interpretation None       Date: 10/06/2013  Rate: 131  Rhythm: atrial fibrillation  QRS Axis: left  Intervals: normal  ST/T Wave abnormalities: nonspecific ST/T changes  Conduction Disutrbances:right bundle branch block  Narrative Interpretation:   Old EKG Reviewed: unchanged from EKG 04 Oct 2013    MDM   Final diagnoses:  Atrial fibrillation with rapid ventricular response      Plan discharge  Devoria Albe, MD, Armando Gang  I personally performed the services described in this documentation, which was scribed in my presence. The recorded information has been reviewed and considered.  Devoria Albe, MD, FACEP    Ward Givens, MD 10/07/13 [phone]

## 2013-10-07 MED ORDER — METOPROLOL TARTRATE 1 MG/ML IV SOLN
5.0000 mg | Freq: Once | INTRAVENOUS | Status: AC
  Administered 2013-10-07: 5 mg via INTRAVENOUS
  Filled 2013-10-07: qty 5

## 2013-10-07 NOTE — Discharge Instructions (Signed)
Increase your cardizem to 30 mg 4 times a day (every 6 hrs) until you can see the cardiologist next week. Return if you feel worse again.

## 2013-10-10 ENCOUNTER — Encounter: Payer: Self-pay | Admitting: Cardiology

## 2013-10-10 ENCOUNTER — Ambulatory Visit (INDEPENDENT_AMBULATORY_CARE_PROVIDER_SITE_OTHER): Payer: Medicare Other | Admitting: Cardiology

## 2013-10-10 VITALS — BP 142/88 | HR 126 | Ht 68.0 in | Wt 161.9 lb

## 2013-10-10 DIAGNOSIS — I10 Essential (primary) hypertension: Secondary | ICD-10-CM

## 2013-10-10 DIAGNOSIS — Z950 Presence of cardiac pacemaker: Secondary | ICD-10-CM

## 2013-10-10 DIAGNOSIS — I480 Paroxysmal atrial fibrillation: Secondary | ICD-10-CM

## 2013-10-10 DIAGNOSIS — I420 Dilated cardiomyopathy: Secondary | ICD-10-CM

## 2013-10-10 DIAGNOSIS — N183 Chronic kidney disease, stage 3 unspecified: Secondary | ICD-10-CM

## 2013-10-10 DIAGNOSIS — I428 Other cardiomyopathies: Secondary | ICD-10-CM

## 2013-10-10 DIAGNOSIS — I4891 Unspecified atrial fibrillation: Secondary | ICD-10-CM

## 2013-10-10 MED ORDER — OMEPRAZOLE 20 MG PO CPDR: 20.0000 mg | DELAYED_RELEASE_CAPSULE | Freq: Every day | ORAL | Status: DC

## 2013-10-10 MED ORDER — METOPROLOL TARTRATE 25 MG PO TABS: 25.0000 mg | ORAL_TABLET | Freq: Two times a day (BID) | ORAL | Status: DC

## 2013-10-10 MED ORDER — APIXABAN 2.5 MG PO TABS: 2.5000 mg | ORAL_TABLET | Freq: Two times a day (BID) | ORAL | Status: DC

## 2013-10-10 MED ORDER — DILTIAZEM HCL ER COATED BEADS 120 MG PO CP24: 120.0000 mg | ORAL_CAPSULE | Freq: Every day | ORAL | Status: DC

## 2013-10-10 NOTE — Assessment & Plan Note (Signed)
B/P soft in the hospital, stable today

## 2013-10-10 NOTE — Progress Notes (Signed)
10/10/2013 Kathleen [name]   1924-03-21  [id number]  Primary Physicia [name]., MD Primary Cardiologist: Dr Royann Shivers  HPI:  80 y.o.female with a history of NICM with an LVEF of 35-40% by echo 09/2011(this was prior to CRT-P). She also has a history of high degree second degree heart block with and underwent pacemaker that was later upgraded to a CRT device. She has been a responder clinically. She has has previous PAF but has been in NSR the last few years. Other problems include, CKD, DM2, HTN, and  Hyperlipidemia. She was admitted 10/03/13 with palpitations and was found to be in AF with RVR.  She had troubles on coumadin previously for her afib, with recurrent knee hemoarthrosis requiring aspiration. Coumadin levels were reportedly very labile, and bleeding occurred in setting of "being overanticoagulated". She reports one mechanical fall around that time which seemed to the be final factor in deciding to stop her coumadin. She denies recurrent falls since that time. She has since declined anticoagulation. Today in the office her rate is fast 126. She had her medications cut back in the hospital for low B/P and I think she went home on Diltiazem 30 mg BID. This was later increased to 30 mg Q6. She has DOE but is comfortable at rest.     Current Outpatient Prescriptions  Medication Sig Dispense Refill  . albuterol (PROVENTIL HFA;VENTOLIN HFA) 108 (90 BASE) MCG/ACT inhaler Inhale 2 puffs into the lungs every 6 (six) hours as needed for wheezing or shortness of breath.      Marland Kitchen aspirin EC 81 MG tablet Take 81 mg by mouth daily.      . cephALEXin (KEFLEX) 250 MG capsule Take 1 capsule (250 mg total) by mouth every 8 (eight) hours.  30 capsule  0  . cholecalciferol (VITAMIN D) 1000 UNITS tablet Take 1,000 Units by mouth daily.      Marland Kitchen diltiazem (CARDIZEM) 30 MG tablet Take 1 tablet (30 mg total) by mouth every 12 (twelve) hours.  60 tablet  0  . docusate sodium (COLACE) 100 MG capsule Take  100 mg by mouth 2 (two) times daily as needed for mild constipation.       Marland Kitchen glimepiride (AMARYL) 2 MG tablet Take 1 mg by mouth daily as needed (if blood sugar is over 120).       . Multiple Vitamins-Minerals (CENTRUM SILVER PO) Take 1 tablet by mouth daily.      . simvastatin (ZOCOR) 40 MG tablet Take 1 tablet (40 mg total) by mouth at bedtime.  30 tablet  7  . [DISCONTINUED] benazepril (LOTENSIN) 40 MG tablet Take 40 mg by mouth 2 (two) times daily.       No current facility-administered medications for this visit.    Allergies  Allergen Reactions  . Coumadin [Warfarin Sodium] Other (See Comments)    Caused Patient to Bleed Out.   . Meloxicam Other (See Comments)    Unknown  . Morphine Itching    Not in right state of mind.   . Nabumetone Nausea And Vomiting  . Sulfonamide Derivatives Hives    History   Social History  . Marital Status: Widowed    Spouse Name: N/A    Number of Children: N/A  . Years of Education: N/A   Occupational History  . Not on file.   Social History Main Topics  . Smoking status: Never Smoker   . Smokeless tobacco: Never Used  . Alcohol Use: No  . Drug  Use: No  . Sexual Activity: Not Currently   Other Topics Concern  . Not on file   Social History Narrative  . No narrative on file     Review of Systems: General: negative for chills, fever, night sweats or weight changes.  Cardiovascular: negative for chest pain, dyspnea on exertion, edema, orthopnea, palpitations, paroxysmal nocturnal dyspnea or shortness of breath Dermatological: negative for rash Respiratory: negative for cough or wheezing Urologic: negative for hematuria Abdominal: negative for nausea, vomiting, diarrhea, bright red blood per rectum, melena, or hematemesis Neurologic: negative for visual changes, syncope, or dizziness All other systems reviewed and are otherwise negative except as noted above.    Blood pressure 142/88, pulse 126, height  (1.727 m), weight 161  lb 14.4 oz (73.437 kg).  General appearance: alert, cooperative and no distress Lungs: clear to auscultation bilaterally Heart: irregularly irregular rhythm  EKG AF with RBBB, pacing on demand with (?) undersensing  ASSESSMENT AND PLAN:   Paroxysmal a-fib Pt was recently hospitalized with recurrent AF with RVR. She had declined Coumadin in the past after she had some bleeding issues.   CKD (chronic kidney disease) stage 3, GFR 30-59 ml/min SCr 1.5  Cardiomyopathy, dilated, nonischemic Cath 2013  PPM-Medtronic Conventional dual chamber device implanted in March 2013, upgrade to a cardiac resynchronization therapy pacemaker October 2013. She has been a responder- EF 55-60% by echo 10/04/13  Hypertension B/P soft in the hospital, stable today   PLAN  I had a long talk with the pt and her daughter explaining the risks and benefits, including the option for cardioversion as well as stroke reduction,  of anticoagulation. They have agrreed to start Eliquis 2.5 mg BID  (SCr 1.5 and age > 25) and stop ASA . I added Lopressor 25 mg BID and changed her Diltiazem to 120 mg daily. She'll need follow up in two weeks with Dr Royann Shivers.   [facility] 10/10/2013 4:48 PM

## 2013-10-10 NOTE — Patient Instructions (Addendum)
Start Eliquis 2.5 mg twice a day 10/11/13  Stop Aspirin  Finish Diltiazem tonight start Diltiazem 120 mg daily 10/11/13  Start Metoprolol Tartrate 25 mg twice a day start tonight 10/10/13   Start Prilosec 20 mg daily can start tonight 10/10/13  Your physician recommends that you schedule a follow-up appointment in: 2 weeks with Dr.Croitoru   Monday 10/24/13 at 3:00 pm

## 2013-10-10 NOTE — Assessment & Plan Note (Signed)
SCr 1.5  

## 2013-10-10 NOTE — Assessment & Plan Note (Signed)
Cath 2013 

## 2013-10-10 NOTE — Assessment & Plan Note (Signed)
Pt was recently hospitalized with recurrent AF with RVR. She had declined Coumadin in the past after she had some bleeding issues.

## 2013-10-10 NOTE — Assessment & Plan Note (Signed)
Conventional dual chamber device implanted in March 2013, upgrade to a cardiac resynchronization therapy pacemaker October 2013. She has been a responder- EF 55-60% by echo 10/04/13

## 2013-10-12 ENCOUNTER — Encounter: Payer: Self-pay | Admitting: Cardiovascular Disease

## 2013-10-12 ENCOUNTER — Encounter: Payer: Medicare Other | Admitting: Adult Health

## 2013-10-14 NOTE — Addendum Note (Signed)
Addended by: Neta Ehlers on: 10/14/2013 12:43 PM   Modules accepted: Orders

## 2013-10-16 ENCOUNTER — Inpatient Hospital Stay (HOSPITAL_COMMUNITY)
Admission: EM | Admit: 2013-10-16 | Discharge: 2013-10-20 | DRG: 309 | Disposition: A | Discharge status: Home Health | Payer: Medicare Other | Attending: Internal Medicine | Admitting: Internal Medicine

## 2013-10-16 ENCOUNTER — Emergency Department (HOSPITAL_COMMUNITY): Payer: Medicare Other

## 2013-10-16 ENCOUNTER — Encounter (HOSPITAL_COMMUNITY): Payer: Self-pay | Admitting: Emergency Medicine

## 2013-10-16 DIAGNOSIS — K219 Gastro-esophageal reflux disease without esophagitis: Secondary | ICD-10-CM | POA: Diagnosis present

## 2013-10-16 DIAGNOSIS — I2510 Atherosclerotic heart disease of native coronary artery without angina pectoris: Secondary | ICD-10-CM | POA: Diagnosis present

## 2013-10-16 DIAGNOSIS — I5042 Chronic combined systolic (congestive) and diastolic (congestive) heart failure: Secondary | ICD-10-CM

## 2013-10-16 DIAGNOSIS — R000 Tachycardia, unspecified: Secondary | ICD-10-CM

## 2013-10-16 DIAGNOSIS — D509 Iron deficiency anemia, unspecified: Secondary | ICD-10-CM

## 2013-10-16 DIAGNOSIS — N289 Disorder of kidney and ureter, unspecified: Secondary | ICD-10-CM

## 2013-10-16 DIAGNOSIS — R002 Palpitations: Secondary | ICD-10-CM

## 2013-10-16 DIAGNOSIS — E119 Type 2 diabetes mellitus without complications: Secondary | ICD-10-CM | POA: Diagnosis present

## 2013-10-16 DIAGNOSIS — Z8249 Family history of ischemic heart disease and other diseases of the circulatory system: Secondary | ICD-10-CM

## 2013-10-16 DIAGNOSIS — N183 Chronic kidney disease, stage 3 unspecified: Secondary | ICD-10-CM

## 2013-10-16 DIAGNOSIS — I4729 Other ventricular tachycardia: Secondary | ICD-10-CM

## 2013-10-16 DIAGNOSIS — Z79899 Other long term (current) drug therapy: Secondary | ICD-10-CM | POA: Diagnosis not present

## 2013-10-16 DIAGNOSIS — E1121 Type 2 diabetes mellitus with diabetic nephropathy: Secondary | ICD-10-CM

## 2013-10-16 DIAGNOSIS — I5021 Acute systolic (congestive) heart failure: Secondary | ICD-10-CM

## 2013-10-16 DIAGNOSIS — I441 Atrioventricular block, second degree: Secondary | ICD-10-CM

## 2013-10-16 DIAGNOSIS — I129 Hypertensive chronic kidney disease with stage 1 through stage 4 chronic kidney disease, or unspecified chronic kidney disease: Secondary | ICD-10-CM | POA: Diagnosis present

## 2013-10-16 DIAGNOSIS — N189 Chronic kidney disease, unspecified: Secondary | ICD-10-CM

## 2013-10-16 DIAGNOSIS — E780 Pure hypercholesterolemia: Secondary | ICD-10-CM | POA: Diagnosis present

## 2013-10-16 DIAGNOSIS — I4891 Unspecified atrial fibrillation: Secondary | ICD-10-CM

## 2013-10-16 DIAGNOSIS — R911 Solitary pulmonary nodule: Secondary | ICD-10-CM

## 2013-10-16 DIAGNOSIS — Z950 Presence of cardiac pacemaker: Secondary | ICD-10-CM | POA: Diagnosis not present

## 2013-10-16 DIAGNOSIS — E785 Hyperlipidemia, unspecified: Secondary | ICD-10-CM | POA: Diagnosis present

## 2013-10-16 DIAGNOSIS — R0902 Hypoxemia: Secondary | ICD-10-CM

## 2013-10-16 DIAGNOSIS — M25069 Hemarthrosis, unspecified knee: Secondary | ICD-10-CM

## 2013-10-16 DIAGNOSIS — R0989 Other specified symptoms and signs involving the circulatory and respiratory systems: Secondary | ICD-10-CM

## 2013-10-16 DIAGNOSIS — I10 Essential (primary) hypertension: Secondary | ICD-10-CM

## 2013-10-16 DIAGNOSIS — J438 Other emphysema: Secondary | ICD-10-CM

## 2013-10-16 DIAGNOSIS — I472 Ventricular tachycardia: Secondary | ICD-10-CM

## 2013-10-16 DIAGNOSIS — I420 Dilated cardiomyopathy: Secondary | ICD-10-CM

## 2013-10-16 DIAGNOSIS — I429 Cardiomyopathy, unspecified: Secondary | ICD-10-CM

## 2013-10-16 DIAGNOSIS — I480 Paroxysmal atrial fibrillation: Secondary | ICD-10-CM | POA: Diagnosis present

## 2013-10-16 LAB — BASIC METABOLIC PANEL WITH GFR
Anion gap: 15 (ref 5–15)
BUN: 15 mg/dL (ref 6–23)
CO2: 26 meq/L (ref 19–32)
Calcium: 9.2 mg/dL (ref 8.4–10.5)
Chloride: 92 meq/L — ABNORMAL LOW (ref 96–112)
Creatinine, Ser: 1.21 mg/dL — ABNORMAL HIGH (ref 0.50–1.10)
GFR calc Af Amer: 46 mL/min — ABNORMAL LOW
GFR calc non Af Amer: 39 mL/min — ABNORMAL LOW
Glucose, Bld: 202 mg/dL — ABNORMAL HIGH (ref 70–99)
Potassium: 4.2 meq/L (ref 3.7–5.3)
Sodium: 133 meq/L — ABNORMAL LOW (ref 137–147)

## 2013-10-16 LAB — GLUCOSE, CAPILLARY
GLUCOSE-CAPILLARY: 191 mg/dL — AB (ref 70–99)
Glucose-Capillary: 206 mg/dL — ABNORMAL HIGH (ref 70–99)
Glucose-Capillary: 209 mg/dL — ABNORMAL HIGH (ref 70–99)

## 2013-10-16 LAB — CBC WITH DIFFERENTIAL/PLATELET
Basophils Absolute: 0 K/uL (ref 0.0–0.1)
Basophils Relative: 0 % (ref 0–1)
Eosinophils Absolute: 0.1 K/uL (ref 0.0–0.7)
Eosinophils Relative: 1 % (ref 0–5)
HCT: 36.3 % (ref 36.0–46.0)
Hemoglobin: 12.6 g/dL (ref 12.0–15.0)
Lymphocytes Relative: 21 % (ref 12–46)
Lymphs Abs: 1.8 K/uL (ref 0.7–4.0)
MCH: 33.4 pg (ref 26.0–34.0)
MCHC: 34.7 g/dL (ref 30.0–36.0)
MCV: 96.3 fL (ref 78.0–100.0)
Monocytes Absolute: 0.6 K/uL (ref 0.1–1.0)
Monocytes Relative: 7 % (ref 3–12)
Neutro Abs: 6 K/uL (ref 1.7–7.7)
Neutrophils Relative %: 71 % (ref 43–77)
Platelets: 268 K/uL (ref 150–400)
RBC: 3.77 MIL/uL — ABNORMAL LOW (ref 3.87–5.11)
RDW: 13.4 % (ref 11.5–15.5)
WBC: 8.6 K/uL (ref 4.0–10.5)

## 2013-10-16 LAB — URINALYSIS, ROUTINE W REFLEX MICROSCOPIC
Bilirubin Urine: NEGATIVE
Glucose, UA: NEGATIVE mg/dL
Hgb urine dipstick: NEGATIVE
Ketones, ur: NEGATIVE mg/dL
Leukocytes, UA: NEGATIVE
Nitrite: NEGATIVE
Protein, ur: NEGATIVE mg/dL
Specific Gravity, Urine: 1.01 (ref 1.005–1.030)
Urobilinogen, UA: 0.2 mg/dL (ref 0.0–1.0)
pH: 6.5 (ref 5.0–8.0)

## 2013-10-16 LAB — TROPONIN I
Troponin I: <0.3 ng/mL (ref <0.30)
Troponin I: <0.3 ng/mL (ref <0.30)

## 2013-10-16 LAB — PRO B NATRIURETIC PEPTIDE: Pro B Natriuretic peptide (BNP): 3226 pg/mL — ABNORMAL HIGH (ref 0–450)

## 2013-10-16 LAB — MRSA PCR SCREENING: MRSA by PCR: NEGATIVE

## 2013-10-16 MED ORDER — SODIUM CHLORIDE 0.9 % IJ SOLN
3.0000 mL | Freq: Two times a day (BID) | INTRAMUSCULAR | Status: DC
  Administered 2013-10-16 – 2013-10-19 (×6): 3 mL via INTRAVENOUS

## 2013-10-16 MED ORDER — PANTOPRAZOLE SODIUM 40 MG PO TBEC
40.0000 mg | DELAYED_RELEASE_TABLET | Freq: Every day | ORAL | Status: DC
  Administered 2013-10-16 – 2013-10-20 (×5): 40 mg via ORAL
  Filled 2013-10-16 (×5): qty 1

## 2013-10-16 MED ORDER — ALBUTEROL SULFATE (2.5 MG/3ML) 0.083% IN NEBU: 2.5000 mg | INHALATION_SOLUTION | Freq: Four times a day (QID) | RESPIRATORY_TRACT | Status: DC | PRN

## 2013-10-16 MED ORDER — DILTIAZEM LOAD VIA INFUSION
15.0000 mg | Freq: Once | INTRAVENOUS | Status: AC
  Administered 2013-10-16: 15 mg via INTRAVENOUS
  Filled 2013-10-16: qty 15

## 2013-10-16 MED ORDER — VITAMIN D 1000 UNITS PO TABS
1000.0000 [IU] | ORAL_TABLET | Freq: Every day | ORAL | Status: DC
  Administered 2013-10-16 – 2013-10-19 (×4): 1000 [IU] via ORAL
  Filled 2013-10-16 (×4): qty 1

## 2013-10-16 MED ORDER — ATORVASTATIN CALCIUM 20 MG PO TABS
20.0000 mg | ORAL_TABLET | Freq: Every day | ORAL | Status: DC
  Administered 2013-10-16 – 2013-10-19 (×4): 20 mg via ORAL
  Filled 2013-10-16 (×4): qty 1

## 2013-10-16 MED ORDER — ONDANSETRON HCL 4 MG/2ML IJ SOLN
4.0000 mg | Freq: Four times a day (QID) | INTRAMUSCULAR | Status: DC | PRN
  Administered 2013-10-18: 4 mg via INTRAVENOUS
  Filled 2013-10-16: qty 2

## 2013-10-16 MED ORDER — DILTIAZEM HCL 100 MG IV SOLR
5.0000 mg/h | INTRAVENOUS | Status: DC
  Administered 2013-10-16 – 2013-10-17 (×2): 5 mg/h via INTRAVENOUS
  Filled 2013-10-16: qty 100

## 2013-10-16 MED ORDER — METOPROLOL TARTRATE 25 MG PO TABS
25.0000 mg | ORAL_TABLET | Freq: Two times a day (BID) | ORAL | Status: DC
  Administered 2013-10-16: 25 mg via ORAL
  Filled 2013-10-16: qty 1

## 2013-10-16 MED ORDER — FUROSEMIDE 10 MG/ML IJ SOLN
20.0000 mg | Freq: Once | INTRAMUSCULAR | Status: AC
  Administered 2013-10-16: 20 mg via INTRAVENOUS
  Filled 2013-10-16: qty 2

## 2013-10-16 MED ORDER — METOPROLOL TARTRATE 50 MG PO TABS
50.0000 mg | ORAL_TABLET | Freq: Two times a day (BID) | ORAL | Status: DC
  Administered 2013-10-16: 50 mg via ORAL
  Filled 2013-10-16: qty 1

## 2013-10-16 MED ORDER — SIMVASTATIN 20 MG PO TABS: 40.0000 mg | ORAL_TABLET | Freq: Every day | ORAL | Status: DC

## 2013-10-16 MED ORDER — INSULIN ASPART 100 UNIT/ML ~~LOC~~ SOLN
0.0000 [IU] | Freq: Three times a day (TID) | SUBCUTANEOUS | Status: DC
  Administered 2013-10-16: 5 [IU] via SUBCUTANEOUS
  Administered 2013-10-16 – 2013-10-17 (×3): 3 [IU] via SUBCUTANEOUS
  Administered 2013-10-17: 5 [IU] via SUBCUTANEOUS
  Administered 2013-10-18: 2 [IU] via SUBCUTANEOUS
  Administered 2013-10-18: 5 [IU] via SUBCUTANEOUS
  Administered 2013-10-18 – 2013-10-19 (×3): 3 [IU] via SUBCUTANEOUS
  Administered 2013-10-19: 2 [IU] via SUBCUTANEOUS
  Administered 2013-10-20: 3 [IU] via SUBCUTANEOUS

## 2013-10-16 MED ORDER — ACETAMINOPHEN 650 MG RE SUPP: 650.0000 mg | Freq: Four times a day (QID) | RECTAL | Status: DC | PRN

## 2013-10-16 MED ORDER — ACETAMINOPHEN 325 MG PO TABS
650.0000 mg | ORAL_TABLET | Freq: Four times a day (QID) | ORAL | Status: DC | PRN
Start: 2013-10-16 — End: 2013-10-20

## 2013-10-16 MED ORDER — ONDANSETRON HCL 4 MG PO TABS: 4.0000 mg | ORAL_TABLET | Freq: Four times a day (QID) | ORAL | Status: DC | PRN

## 2013-10-16 MED ORDER — DILTIAZEM HCL ER COATED BEADS 180 MG PO CP24
180.0000 mg | ORAL_CAPSULE | Freq: Every day | ORAL | Status: DC
  Administered 2013-10-16 – 2013-10-19 (×4): 180 mg via ORAL
  Filled 2013-10-16 (×4): qty 1

## 2013-10-16 MED ORDER — INSULIN ASPART 100 UNIT/ML ~~LOC~~ SOLN
0.0000 [IU] | Freq: Every day | SUBCUTANEOUS | Status: DC
  Administered 2013-10-16 – 2013-10-18 (×3): 2 [IU] via SUBCUTANEOUS

## 2013-10-16 MED ORDER — APIXABAN 2.5 MG PO TABS
2.5000 mg | ORAL_TABLET | Freq: Two times a day (BID) | ORAL | Status: DC
  Administered 2013-10-16 – 2013-10-20 (×9): 2.5 mg via ORAL
  Filled 2013-10-16 (×11): qty 1

## 2013-10-16 NOTE — Progress Notes (Signed)
Pt. Had increased HR 140's to 150's with palpations and runs of V-Tach.  Dr. Adrian Blackwater paged after STAT 12 lead EKG was performed results in Epic.  Cardizem drip restarted and Troponin redraw was <0.30 MD informed.

## 2013-10-16 NOTE — ED Notes (Signed)
Woke up at 6am   With rapid heart rate.  Denies any cp or sob.

## 2013-10-16 NOTE — ED Notes (Signed)
See triage assessment. Pt here with rapid heart rate (130-140 on monitor). Hx a-fib. A&O, denies pain, reports "fullness" in chest.

## 2013-10-16 NOTE — ED Provider Notes (Signed)
CSN: [id number]     Arrival date & time 10/16/13  7829 History   This chart was scribed for Enid Skeens, MD, by Yevette Edwards, ED Scribe. This patient was seen in room APA02/APA02 and the patient's care was started at 7:16 AM.  None    Chief Complaint  Patient presents with  . Tachycardia    The history is provided by the patient and a relative. No language interpreter was used.   HPI Comments: EMAAN [name] is a 80 y.o. female, with a h/o Atrial fibrillaion, CAD, and CHF, who presents to the Emergency Department complaining of palpitations two hours ago, which were present upon awaking this morning. Her daughter reports the palpitations have been occurring intermittently for two weeks. Kathleen [name] states she takes medication as prescribed; the diltiazem was increased to 120 mg five days ago. She denies chest pain, SOB, headache, vision changes, abdominal pain, emesis, diarrhea, dysuria, or a cough. She reports mild swelling to her left foot. She also endorses a kidney infection for which she started Nitrofurantoin two days ago. The pt has a h/o COPD and HTN.   Thurmon Fair, MD, is the pt's cardiologist. Dr. Sherwood Gambler is her PCP.   Past Medical History  Diagnosis Date  . Hypertension   . Diabetes mellitus   . High cholesterol   . Atrial fibrillation   . Paroxysmal a-fib 12/13/2010  . PNA (pneumonia)   . CAD (coronary artery disease) 2006  . Acid reflux   . AV block, 2nd degree     S/p Pacemaker 03/2011  . CHF (congestive heart failure)   . Shortness of breath   . Pacemaker 10/16/2011    medtronic  . Nonischemic cardiomyopathy   . Chronic kidney disease (CKD), stage III (moderate)    Past Surgical History  Procedure Laterality Date  . Replacement total knee    . Bladder tact  1970's  . Pacemaker insertion  10/16/2011    Medtronic  . Cholecystectomy  1999  . Bladder tack    . Nm myocar perf wall motion  03/15/2009    Normal  . Cardiac catheterization  10/15/2011    Normal  coronaries   Family History  Problem Relation Age of Onset  . Heart murmur Daughter   . Suicidality Mother   . Heart attack Brother    History  Substance Use Topics  . Smoking status: Never Smoker   . Smokeless tobacco: Never Used  . Alcohol Use: No   No OB history provided.  Review of Systems  Constitutional: Negative for fever.  Eyes: Negative for visual disturbance.  Respiratory: Negative for cough and shortness of breath.   Cardiovascular: Positive for palpitations and leg swelling. Negative for chest pain.  Gastrointestinal: Negative for nausea and vomiting.  Genitourinary: Negative for dysuria.  All other systems reviewed and are negative.   Allergies  Coumadin; Meloxicam; Morphine; Nabumetone; and Sulfonamide derivatives  Home Medications   Prior to Admission medications   Medication Sig Start Date End Date Taking? Authorizing Provider  albuterol (PROVENTIL HFA;VENTOLIN HFA) 108 (90 BASE) MCG/ACT inhaler Inhale 2 puffs into the lungs every 6 (six) hours as needed for wheezing or shortness of breath.    Historical Provider, MD  apixaban (ELIQUIS) 2.5 MG TABS tablet Take 1 tablet (2.5 mg total) by mouth 2 (two) times daily. 10/10/13   Abelino Derrick, PA-C  cephALEXin (KEFLEX) 250 MG capsule Take 1 capsule (250 mg total) by mouth every 8 (eight) hours. 10/05/13  Gwenyth Bender, NP  cholecalciferol (VITAMIN D) 1000 UNITS tablet Take 1,000 Units by mouth daily.    Historical Provider, MD  diltiazem (CARDIZEM CD) 120 MG 24 hr capsule Take 1 capsule (120 mg total) by mouth daily. 10/10/13   Abelino Derrick, PA-C  docusate sodium (COLACE) 100 MG capsule Take 100 mg by mouth 2 (two) times daily as needed for mild constipation.     Historical Provider, MD  glimepiride (AMARYL) 2 MG tablet Take 1 mg by mouth daily as needed (if blood sugar is over 120).  08/24/12   Historical Provider, MD  metoprolol tartrate (LOPRESSOR) 25 MG tablet Take 1 tablet (25 mg total) by mouth 2 (two) times  daily. 10/10/13   Abelino Derrick, PA-C  Multiple Vitamins-Minerals (CENTRUM SILVER PO) Take 1 tablet by mouth daily.    Historical Provider, MD  omeprazole (PRILOSEC) 20 MG capsule Take 1 capsule (20 mg total) by mouth daily. 10/10/13   Abelino Derrick, PA-C  simvastatin (ZOCOR) 40 MG tablet Take 1 tablet (40 mg total) by mouth at bedtime. 04/06/13   Mihai Croitoru, MD   Triage Vitals: BP 145/115  Pulse 135  Temp(Src) 97.9 F (36.6 C) (Oral)  Resp 18  Wt 161 lb (73.029 kg)  SpO2 92%  Physical Exam  Nursing note and vitals reviewed. Constitutional: She is oriented to person, place, and time. She appears well-developed and well-nourished. No distress.  Overall well-appearing.   HENT:  Head: Normocephalic and atraumatic.  Mild dry mucous membranes.   Eyes: Conjunctivae and EOM are normal.  Neck: Neck supple. No tracheal deviation present.  Cardiovascular: Tachycardia present.   Fast, irregular regular.   Pulmonary/Chest: Effort normal. No respiratory distress. She has rales.  Crackles to left anterior and bases.   Abdominal: Soft. She exhibits no distension. There is no tenderness.  Musculoskeletal: Normal range of motion. She exhibits no edema.  No CVA tenderness  Neurological: She is alert and oriented to person, place, and time.  Skin: Skin is warm and dry.  Psychiatric: She has a normal mood and affect. Her behavior is normal.    ED Course  Procedures (including critical care time) CRITICAL CARE Performed by: Enid Skeens   Total critical care time: 35 min   Critical care time was exclusive of separately billable procedures and treating other patients.  Critical care was necessary to treat or prevent imminent or life-threatening deterioration.  Critical care was time spent personally by me on the following activities: development of treatment plan with patient and/or surrogate as well as nursing, discussions with consultants, evaluation of patient's response to treatment,  examination of patient, obtaining history from patient or surrogate, ordering and performing treatments and interventions, ordering and review of laboratory studies, ordering and review of radiographic studies, pulse oximetry and re-evaluation of patient's condition.   DIAGNOSTIC STUDIES: Oxygen Saturation is 92% on room air, low by my interpretation.    COORDINATION OF CARE:  7:11 AM- Discussed treatment plan with patient, and the patient agreed to the plan. The plan includes imaging and lab work.   8:16 AM- Rechecked pt. Pt reports improvement to the palpitations.  Labs Review Labs Reviewed  BASIC METABOLIC PANEL - Abnormal; Notable for the following:    Sodium 133 (*)    Chloride 92 (*)    Glucose, Bld 202 (*)    Creatinine, Ser 1.21 (*)    GFR calc non Af Amer 39 (*)    GFR calc Af Amer 46 (*)  All other components within normal limits  CBC WITH DIFFERENTIAL - Abnormal; Notable for the following:    RBC 3.77 (*)    All other components within normal limits  PRO B NATRIURETIC PEPTIDE - Abnormal; Notable for the following:    Pro B Natriuretic peptide (BNP) 3226.0 (*)    All other components within normal limits  TROPONIN I  URINALYSIS, ROUTINE W REFLEX MICROSCOPIC    Imaging Review Dg Chest Port 1 View  10/16/2013   CLINICAL DATA:  Pt states she woke up today @ 0600 hrs with a rapid heart rate. NO c/o CP or SOB. Pt states her chest feels full. 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  EXAM: PORTABLE CHEST - 1 VIEW  COMPARISON:  10/04/2013  FINDINGS: Left subclavian pacemaker stable. Mild cardiomegaly stable. Coarse bronchovascular markings. The bilateral upper lobe interstitial thickening and nodularity is unchanged from previous study. No new infiltrate. No effusion. Visualized skeletal structures are unremarkable.  IMPRESSION: 1. Persistent bilateral upper lobe interstitial  nodular and interstitial opacities superimposed on chronic who interstitial lung disease. 2. Stable cardiomegaly   Electronically Signed   By: Oley Balm M.D.   On: 10/16/2013 08:33     EKG Interpretation   Date/Time:  Sunday October 16 2013 07:03:03 EDT Ventricular Rate:  134 PR Interval:    QRS Duration: 149 QT Interval:  368 QTC Calculation: 549 R Axis:   108 Text Interpretation:  Atrial fibrillation with RVR Right bundle branch  block Probable anteroseptal infarct, old Similar to previous Confirmed by  Elizabelle Fite  MD, Ninnie Fein ([id number]) on 10/16/2013 7:12:40 AM      MDM   Final diagnoses:  Atrial fibrillation with RVR  Heart palpitations  Chronic combined systolic and diastolic CHF (congestive heart failure)  CRF (chronic renal failure), unspecified stage   Patient with history of atrial fibrillation on diltiazem presents with atrial fibrillation with rapid ventricular response, palpitations without any chest pain or shortness of breath. Patient's heart rate improved and overall controlled with diltiazem bolus and drip at 5mg .  Observed in ER and plan for telemetry admission for heart rate control and medicine adjustment. Chest x-ray pending as clinically patient has mild pulmonary edema, not requiring oxygen. Pt on eliquis.  Chest x-ray reviewed myself showing persistent opacities upper lungs and left lower lung base, patient no white count, no cough, no shortness of breath, no fever, plan for holding antibiotics at this time. Discussed with triad hospitalist for stepped-down. Cardizem drip adjusted and patient's heart rate in symptoms improved in ER. The patients results and plan were reviewed and discussed.   Any x-rays performed were personally reviewed by myself.   Differential diagnosis were considered with the presenting HPI.  Medications  diltiazem (CARDIZEM) 1 mg/mL load via infusion 15 mg (0 mg Intravenous Stopped 10/16/13 0744)    And  diltiazem (CARDIZEM) 100 mg in  dextrose 5 % 100 mL (1 mg/mL) infusion (5 mg/hr Intravenous New Bag/Given 10/16/13 0742)    Filed Vitals:   10/16/13 0700 10/16/13 0730 10/16/13 0741  BP: 145/115 151/89 135/75  Pulse: 135 128 97  Temp: 97.9 F (36.6 C)    TempSrc: Oral    Resp: 18 26 15   Weight: 161 lb (73.029 kg)    SpO2: 92% 95% 95%    Final diagnoses:  Atrial fibrillation with RVR  Heart palpitations  Chronic combined systolic and diastolic CHF (congestive heart failure)  Admission/ observation were discussed with the admitting physician, patient and/or family and they are comfortable with the plan.    Enid SkeensJoshua M Raja Caputi, MD 10/16/13 [id number]

## 2013-10-16 NOTE — Progress Notes (Signed)
Pt transferred to unit from ED. Pt is alert and oriented, VS are stable. Will continue to monitor.

## 2013-10-16 NOTE — Progress Notes (Addendum)
Pt has transitioned off of her cardizem drip and HR and BP are stable. The monitor is showing "pacer not captured" intermittently. Pt continues to have frequent PVCs. MD has been notified. No new orders at this time. Will continue to monitor.

## 2013-10-16 NOTE — H&P (Signed)
[facility] History and Physical  Kathleen [name] ZOX:[id number] DOB: 07-30-1924 DOA: 10/16/2013  Referring physician: Emergency Department PCP: [name]., MD  Specialists:   Chief Complaint: Palpitations  HPI: Kathleen [name] is a 80 y.o. female  With a hx of afib, dm2, htn, CAD, HLD who presents to the ED complaints of palpations. Pt reportedly had diltiazem dose recently increased. Pt denies sob, chest pain, diaphoresis or feelings of near syncope. In the ED, pt was noted to have HR into the 130's. She was started on cardizem gtt and hospitalist service consulted for admission.  Review of Systems: Per above, the remainder of the 10pt ros reviewed and are neg  Past Medical History  Diagnosis Date  . Hypertension   . Diabetes mellitus   . High cholesterol   . Atrial fibrillation   . Paroxysmal a-fib 12/13/2010  . PNA (pneumonia)   . CAD (coronary artery disease) 2006  . Acid reflux   . AV block, 2nd degree     S/p Pacemaker 03/2011  . CHF (congestive heart failure)   . Shortness of breath   . Pacemaker 10/16/2011    medtronic  . Nonischemic cardiomyopathy   . Chronic kidney disease (CKD), stage III (moderate)    Past Surgical History  Procedure Laterality Date  . Replacement total knee    . Bladder tact  1970's  . Pacemaker insertion  10/16/2011    Medtronic  . Cholecystectomy  1999  . Bladder tack    . Nm myocar perf wall motion  03/15/2009    Normal  . Cardiac catheterization  10/15/2011    Normal coronaries   Social History:  reports that she has never smoked. She has never used smokeless tobacco. She reports that she does not drink alcohol or use illicit drugs.  where does patient live--home, ALF, SNF? and with whom if at home?  Can patient participate in ADLs?  Allergies  Allergen Reactions  . Coumadin [Warfarin Sodium] Other (See Comments)    Caused Patient to Bleed Out.   . Meloxicam Other (See Comments)    Unknown  . Metformin And Related  Other (See Comments)    Cannot have due to kidney function  . Morphine Itching    Not in right state of mind.   . Nabumetone Nausea And Vomiting  . Sulfonamide Derivatives Hives    Family History  Problem Relation Age of Onset  . Heart murmur Daughter   . Suicidality Mother   . Heart attack Brother     (be sure to complete)  Prior to Admission medications   Medication Sig Start Date End Date Taking? Authorizing Provider  albuterol (PROVENTIL HFA;VENTOLIN HFA) 108 (90 BASE) MCG/ACT inhaler Inhale 2 puffs into the lungs every 6 (six) hours as needed for wheezing or shortness of breath.   Yes Historical Provider, MD  apixaban (ELIQUIS) 2.5 MG TABS tablet Take 1 tablet (2.5 mg total) by mouth 2 (two) times daily. 10/10/13  Yes Abelino Derrick, PA-C  cholecalciferol (VITAMIN D) 1000 UNITS tablet Take 1,000 Units by mouth daily.   Yes Historical Provider, MD  diltiazem (CARDIZEM CD) 120 MG 24 hr capsule Take 1 capsule (120 mg total) by mouth daily. 10/10/13  Yes Luke K Kilroy, PA-C  docusate sodium (COLACE) 100 MG capsule Take 100 mg by mouth 2 (two) times daily as needed for mild constipation.    Yes Historical Provider, MD  glimepiride (AMARYL) 2 MG tablet Take 1 mg by mouth daily as needed (  if blood sugar is over 120).  08/24/12  Yes Historical Provider, MD  metoprolol tartrate (LOPRESSOR) 25 MG tablet Take 1 tablet (25 mg total) by mouth 2 (two) times daily. 10/10/13  Yes Luke K Kilroy, PA-C  Multiple Vitamins-Minerals (CENTRUM SILVER PO) Take 1 tablet by mouth daily.   Yes Historical Provider, MD  nitrofurantoin, macrocrystal-monohydrate, (MACROBID) 100 MG capsule Take 100 mg by mouth 2 (two) times daily. Started 10/14/2013 x 14 days.   Yes Historical Provider, MD  omeprazole (PRILOSEC) 20 MG capsule Take 1 capsule (20 mg total) by mouth daily. 10/10/13  Yes Luke K Kilroy, PA-C  simvastatin (ZOCOR) 40 MG tablet Take 1 tablet (40 mg total) by mouth at bedtime. 04/06/13  Yes Mihai Croitoru, MD    Physical Exam: Filed Vitals:   10/16/13 0741 10/16/13 0800 10/16/13 0830 10/16/13 0900  BP: 135/75 137/84 139/79 139/77  Pulse: 97 93 90 88  Temp:      TempSrc:      Resp: 15 19 22 28   Weight:      SpO2: 95% 94% 95% 94%     General:  Awake, in nad  Eyes: PERRL B  ENT: membranes moist, dentition fair  Neck: trachea midline, neck supple  Cardiovascular: irregularly irregular, s1, s2  Respiratory: normal resp effort, no wheezing  Abdomen: soft,nondistended  Skin: normal skin turgor, no abnormal skin lesions seen  Musculoskeletal: perfused, no clubbing  Psychiatric: mood/affect normal//no auditory/visual hallucinations  Neurologic: cn2-12 grossly intact, strength/sensation intact  Labs on Admission:  Basic Metabolic Panel:  Recent Labs Lab 10/16/13 0720  NA 133*  K 4.2  CL 92*  CO2 26  GLUCOSE 202*  BUN 15  CREATININE 1.21*  CALCIUM 9.2   Liver Function Tests: No results found for this basename: AST, ALT, ALKPHOS, BILITOT, PROT, ALBUMIN,  in the last 168 hours No results found for this basename: LIPASE, AMYLASE,  in the last 168 hours No results found for this basename: AMMONIA,  in the last 168 hours CBC:  Recent Labs Lab 10/16/13 0720  WBC 8.6  NEUTROABS 6.0  HGB 12.6  HCT 36.3  MCV 96.3  PLT 268   Cardiac Enzymes:  Recent Labs Lab 10/16/13 0720  TROPONINI <0.30    BNP (last 3 results)  Recent Labs  10/04/13 0856 10/16/13 0720  PROBNP 398.6 3226.0*   CBG: No results found for this basename: GLUCAP,  in the last 168 hours  Radiological Exams on Admission: Dg Chest Port 1 View  10/16/2013   CLINICAL DATA:  Pt states she woke up today @ 0600 hrs with a rapid heart rate. NO c/o CP or SOB. Pt states her chest feels full. 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  EXAM: PORTABLE CHEST - 1 VIEW  COMPARISON:  10/04/2013  FINDINGS: Left  subclavian pacemaker stable. Mild cardiomegaly stable. Coarse bronchovascular markings. The bilateral upper lobe interstitial thickening and nodularity is unchanged from previous study. No new infiltrate. No effusion. Visualized skeletal structures are unremarkable.  IMPRESSION: 1. Persistent bilateral upper lobe interstitial nodular and interstitial opacities superimposed on chronic who interstitial lung disease. 2. Stable cardiomegaly   Electronically Signed   By: Oley Balm M.D.   On: 10/16/2013 08:33    EKG: Independently reviewed. Afib RVR  Assessment/Plan Principal Problem:   Atrial fibrillation with RVR Active Problems:   Hypertension   DM type 2 (diabetes mellitus, type 2)   Hyperlipidemia  Cardiomyopathy, dilated, nonischemic   CKD (chronic kidney disease) stage 3, GFR 30-59 ml/min  1. Afib RVR 1. HR into the 130s currently rate controlled on cardizem gtt 2. Lytes stable 3. For now, will continue cardizem gtt 4. Cont eliquis per home regimen 5. Admit pt to stepdown 2. HTN 1. Stable 2. Cont bp meds as tolerated 3. DM2 1. Recent a1c of 8.2 2. Will cont pt on SSI coverage 3. Hold amaryl while pt is in hospital 4. HLD 1. Cont statin 5. Cardiomyopathy 1. Normal LVEF on recent 2d Echo 2. Appears stable 6. CKD3 1. Stable 2. Follow renal function 7. DVT prophylaxis 1. On eliquis per above  Code Status: Full Family Communication: Pt in room Disposition Plan: Pending  Time spent: 35min  CHIU, Scheryl MartenSTEPHEN K [facility] Pager [phone]  If 7PM-7AM, please contact night-coverage [email or web address] Password [email or web address] 10/16/2013, 9:31 AM

## 2013-10-17 DIAGNOSIS — R911 Solitary pulmonary nodule: Secondary | ICD-10-CM

## 2013-10-17 DIAGNOSIS — J438 Other emphysema: Secondary | ICD-10-CM

## 2013-10-17 DIAGNOSIS — I441 Atrioventricular block, second degree: Secondary | ICD-10-CM

## 2013-10-17 DIAGNOSIS — M25069 Hemarthrosis, unspecified knee: Secondary | ICD-10-CM

## 2013-10-17 DIAGNOSIS — Z950 Presence of cardiac pacemaker: Secondary | ICD-10-CM

## 2013-10-17 DIAGNOSIS — R0902 Hypoxemia: Secondary | ICD-10-CM

## 2013-10-17 DIAGNOSIS — N289 Disorder of kidney and ureter, unspecified: Secondary | ICD-10-CM

## 2013-10-17 LAB — CBC
HCT: 32.7 % — ABNORMAL LOW (ref 36.0–46.0)
Hemoglobin: 11.2 g/dL — ABNORMAL LOW (ref 12.0–15.0)
MCH: 33 pg (ref 26.0–34.0)
MCHC: 34.3 g/dL (ref 30.0–36.0)
MCV: 96.5 fL (ref 78.0–100.0)
PLATELETS: 254 10*3/uL (ref 150–400)
RBC: 3.39 MIL/uL — AB (ref 3.87–5.11)
RDW: 13.6 % (ref 11.5–15.5)
WBC: 7.3 10*3/uL (ref 4.0–10.5)

## 2013-10-17 LAB — COMPREHENSIVE METABOLIC PANEL
ALK PHOS: 78 U/L (ref 39–117)
ALT: 15 U/L (ref 0–35)
AST: 15 U/L (ref 0–37)
Albumin: 3.3 g/dL — ABNORMAL LOW (ref 3.5–5.2)
Anion gap: 13 (ref 5–15)
BILIRUBIN TOTAL: 0.6 mg/dL (ref 0.3–1.2)
BUN: 18 mg/dL (ref 6–23)
CALCIUM: 9.1 mg/dL (ref 8.4–10.5)
CHLORIDE: 96 meq/L (ref 96–112)
CO2: 27 meq/L (ref 19–32)
Creatinine, Ser: 1.42 mg/dL — ABNORMAL HIGH (ref 0.50–1.10)
GFR, EST AFRICAN AMERICAN: 38 mL/min — AB (ref >90)
GFR, EST NON AFRICAN AMERICAN: 32 mL/min — AB (ref >90)
GLUCOSE: 156 mg/dL — AB (ref 70–99)
POTASSIUM: 4 meq/L (ref 3.7–5.3)
Sodium: 136 mEq/L — ABNORMAL LOW (ref 137–147)
Total Protein: 6.6 g/dL (ref 6.0–8.3)

## 2013-10-17 LAB — GLUCOSE, CAPILLARY
GLUCOSE-CAPILLARY: 227 mg/dL — AB (ref 70–99)
GLUCOSE-CAPILLARY: 163 mg/dL — AB (ref 70–99)
GLUCOSE-CAPILLARY: 238 mg/dL — AB (ref 70–99)
Glucose-Capillary: 160 mg/dL — ABNORMAL HIGH (ref 70–99)

## 2013-10-17 LAB — TROPONIN I

## 2013-10-17 MED ORDER — AMIODARONE HCL IN DEXTROSE 360-4.14 MG/200ML-% IV SOLN
30.0000 mg/h | INTRAVENOUS | Status: DC
  Administered 2013-10-17 – 2013-10-18 (×4): 30 mg/h via INTRAVENOUS
  Filled 2013-10-17 (×3): qty 200

## 2013-10-17 MED ORDER — METOPROLOL TARTRATE 25 MG PO TABS
25.0000 mg | ORAL_TABLET | Freq: Two times a day (BID) | ORAL | Status: DC
  Administered 2013-10-17 (×2): 25 mg via ORAL
  Filled 2013-10-17 (×2): qty 1

## 2013-10-17 MED ORDER — CETYLPYRIDINIUM CHLORIDE 0.05 % MT LIQD
7.0000 mL | Freq: Two times a day (BID) | OROMUCOSAL | Status: DC
  Administered 2013-10-17 – 2013-10-19 (×5): 7 mL via OROMUCOSAL

## 2013-10-17 MED ORDER — AMIODARONE HCL IN DEXTROSE 360-4.14 MG/200ML-% IV SOLN
60.0000 mg/h | INTRAVENOUS | Status: AC
  Administered 2013-10-17 (×2): 60 mg/h via INTRAVENOUS
  Filled 2013-10-17 (×2): qty 200

## 2013-10-17 MED ORDER — DOCUSATE SODIUM 100 MG PO CAPS
100.0000 mg | ORAL_CAPSULE | Freq: Every day | ORAL | Status: DC
  Administered 2013-10-17 – 2013-10-20 (×4): 100 mg via ORAL
  Filled 2013-10-17 (×4): qty 1

## 2013-10-17 MED ORDER — AMIODARONE LOAD VIA INFUSION
150.0000 mg | Freq: Once | INTRAVENOUS | Status: AC
  Administered 2013-10-17: 150 mg via INTRAVENOUS
  Filled 2013-10-17: qty 83.34

## 2013-10-17 MED ORDER — POLYETHYLENE GLYCOL 3350 17 G PO PACK: 17.0000 g | PACK | Freq: Every day | ORAL | Status: DC | PRN

## 2013-10-17 NOTE — Progress Notes (Signed)
UR chart review completed.  

## 2013-10-17 NOTE — Consult Note (Signed)
CARDIOLOGY CONSULT NOTE   Patient ID: Kathleen [name] MRN: [id number] DOB/AGE: 06/09/1924 80 y.o.  Admit Date: 10/16/2013 Referring Physician: PTH Primary Physician: [name]., MD Consulting Cardiologist: Prentice Docker Md Primary Cardiologist: Coirtoru MD Reason for Consultation: Atrial fibrillation with RVR  Clinical Summary Ms. Kathleen [name] is a 80 y.o.female with a history of NICM with an LVEF of 35-40% by echo 09/2011(this was prior to CRT-P). She also has a history of high degree second degree heart block with and underwent pacemaker that was later upgraded to a CRT device, (last interrogation 07/2013) CKD, DM2, HTN, and Hyperlipidemia.   She was recently admitted 10/03/13 with palpitations and was found to be in AF with RVR at that time. She is not a coumadin candidate secondary to mechanical falls and recurrent knee hemoarthrosis requiring aspiration. She had follow up at the [facility] office on 10/10/2013 She was started on Eliquis 2.5 mg BID, ASA was stopped. Lopressor 25 mg BID was added and diltiazem was changed to 120 mg daily She was to follow up in 2 weeks. The patient states that since previous admission 3 weeks ago, she has not felt better She states that she can still feel heart rate irregular with times of being rapid. She feels profoundly weak and nauseated.  She was admitted through ER with atrial fib with RVR with HR of 135 bpm. BP 145/115. She was found to be slightly hyponatremic with NA of 133, Creatinine 1.21, Cl 92. Pro-BNP 3,226.0. CXR negative for CHF. She was started on diltiazem gtt after 15 mg IV bolus.  She was restarted on po diltiazem at 180 mg over the weekend, but HR remained elevated and she was symptomatic, therefore diltiazem gtt was restarted at 10 mg/hour.  She remains on Eliquis, metoprolol 25 mg BID.         Allergies  Allergen Reactions  . Coumadin [Warfarin Sodium] Other (See Comments)    Caused Patient to Bleed Out.   . Meloxicam  Other (See Comments)    Unknown  . Metformin And Related Other (See Comments)    Cannot have due to kidney function  . Morphine Itching    Not in right state of mind.   . Nabumetone Nausea And Vomiting  . Sulfonamide Derivatives Hives    Medications Scheduled Medications: . apixaban  2.5 mg Oral BID  . atorvastatin  20 mg Oral q1800  . cholecalciferol  1,000 Units Oral Daily  . diltiazem  180 mg Oral Daily  . insulin aspart  0-15 Units Subcutaneous TID WC  . insulin aspart  0-5 Units Subcutaneous QHS  . metoprolol tartrate  50 mg Oral BID  . pantoprazole  40 mg Oral Daily  . sodium chloride  3 mL Intravenous Q12H    Infusions: . diltiazem (CARDIZEM) infusion 5 mg/hr (10/17/13 0600)    PRN Medications: acetaminophen, acetaminophen, albuterol, ondansetron (ZOFRAN) IV, ondansetron   Past Medical History  Diagnosis Date  . Hypertension   . Diabetes mellitus   . High cholesterol   . Atrial fibrillation   . Paroxysmal a-fib 12/13/2010  . PNA (pneumonia)   . CAD (coronary artery disease) 2006  . Acid reflux   . AV block, 2nd degree     S/p Pacemaker 03/2011  . CHF (congestive heart failure)   . Shortness of breath   . Pacemaker 10/16/2011    medtronic  . Nonischemic cardiomyopathy   . Chronic kidney disease (CKD), stage III (moderate)     Past Surgical History  Procedure Laterality Date  . Replacement total knee    . Bladder tact  1970's  . Pacemaker insertion  10/16/2011    Medtronic  . Cholecystectomy  1999  . Bladder tack    . Nm myocar perf wall motion  03/15/2009    Normal  . Cardiac catheterization  10/15/2011    Normal coronaries    Family History  Problem Relation Age of Onset  . Heart murmur Daughter   . Suicidality Mother   . Heart attack Brother     Social History Ms. Kathleen [name] reports that she has never smoked. She has never used smokeless tobacco. Ms. Kathleen [name] reports that she does not drink alcohol.  Review of Systems Otherwise reviewed and  negative except as outlined.  Physical Examination Blood pressure 117/61, pulse 75, temperature 98.2 F (36.8 C), temperature source Oral, resp. rate 20, height 5\' 8"  (1.727 m), weight 159 lb 6.3 oz (72.3 kg), SpO2 99.00%.  Intake/Output Summary (Last 24 hours) at 10/17/13 0742 Last data filed at 10/17/13 0600  Gross per 24 hour  Intake 460.22 ml  Output    900 ml  Net -439.78 ml    Telemetry:  GEN: HEENT: Conjunctiva and lids normal, oropharynx clear with moist mucosa. Neck: Supple, no elevated JVP or carotid bruits, no thyromegaly. Lungs: Clear to auscultation, nonlabored breathing at rest. Cardiac: Regular rate and rhythm, no S3 or significant systolic murmur, no pericardial rub. Abdomen: Soft, nontender, no hepatomegaly, bowel sounds present, no guarding or rebound. Extremities: No pitting edema, distal pulses 2+. Skin: Warm and dry. Musculoskeletal: No kyphosis. Neuropsychiatric: Alert and oriented x3, affect grossly appropriate.  Prior Cardiac Testing/Procedures 1.Echocardiogram: 10/04/2013 Left ventricle: The cavity size was normal. Wall thickness was increased in a pattern of mild LVH. Systolic function was normal. The estimated ejection fraction was in the range of 55% to 60%. - Aortic valve: Mildly calcified annulus. Trileaflet; mildly thickened leaflets. Valve area (VTI): 1.56 cm^2. Valve area (Vmax): 1.75 cm^2. Left atrium: The atrium was at the upper limits of normal in size. Technically difficult study.  2.Remote CRT-P Device check 08/01/2013 Remote CRT-P device check. Normal device function. Thresholds, sensing, impedance consistent with previous measurements. Histograms appropriate for patient and level of activity. No mode switche. 1 nst episode lasting 7 beats. Patient bi-ventricularly pacing 99% of the time. Device programmed with appropriate safety margins. Device heart failure diagnostics are within normal limits and stable over time. Estimated longevity 6.5  years. [facility]  3. Cardiac Cath 10/15/2011 Right and Left  Heart Catheterization:  RA - 3  RV - 37/4  PA - 36/11 (23)  PCWP - 15  TPG - 8  PA Sat - 65%  AO Sat - 96% (RA)  TDCO/CI - 2.65/1.47  FCO/CI - 5.12/2.84  PVR - 2.2 wood units  Coronary Angiographic Data:  Left Main: Short left main, angiographically normal  Left Anterior Descending (LAD): Angiographically normal, normal caliber, extends around the apex  1st diagonal (D1): Moderate sized vessel, no stenosis  2nd diagonal (D2): Small vessel, no angiographic stenosis.  Circumflex (LCx): Moderate sized vessel, no stenosis  1st obtuse marginal: Large vessel with high lateral takeoff, no stenosis.  Ramus Intermedius: True ramus vessel, moderate sized, that coarses between the LAD and high OM branch. No stenosis.  Right Coronary Artery: Smaller caliber, but dominant. No angiographic stenosis. Ostial speck of calcium, but not stenotic.  posterior descending artery: Normal  posterior lateral branch: Normal Impression:  1. Angiographically normal coronary arteries.  2. Reduced cardiac output with normal right heart pressures. Normal PA saturation. Normal PVR.  3. 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   Lab Results  Basic Metabolic Panel:  Recent Labs Lab 10/16/13 0720 10/17/13 0443  NA 133* 136*  K 4.2 4.0  CL 92* 96  CO2 26 27  GLUCOSE 202* 156*  BUN 15 18  CREATININE 1.21* 1.42*  CALCIUM 9.2 9.1    Liver Function Tests:  Recent Labs Lab 10/17/13 0443  AST 15  ALT 15  ALKPHOS 78  BILITOT 0.6  PROT 6.6  ALBUMIN 3.3*    CBC:  Recent Labs Lab 10/16/13 0720 10/17/13 0443  WBC 8.6 7.3  NEUTROABS 6.0  --   HGB 12.6 11.2*  HCT 36.3 32.7*  MCV 96.3 96.5  PLT 268 254    Cardiac Enzymes:  Recent Labs Lab 10/16/13 0720 10/16/13 2045 10/17/13 0443  TROPONINI <0.30 <0.30 <0.30    BNP: 3226.0   Radiology: Dg Chest Port 1 View  10/16/2013   CLINICAL DATA:  Pt states she  woke up today @ 0600 hrs with a rapid heart rate. NO c/o CP or SOB. Pt states her chest feels full. 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  EXAM: PORTABLE CHEST - 1 VIEW  COMPARISON:  10/04/2013  FINDINGS: Left subclavian pacemaker stable. Mild cardiomegaly stable. Coarse bronchovascular markings. The bilateral upper lobe interstitial thickening and nodularity is unchanged from previous study. No new infiltrate. No effusion. Visualized skeletal structures are unremarkable.  IMPRESSION: 1. Persistent bilateral upper lobe interstitial nodular and interstitial opacities superimposed on chronic who interstitial lung disease. 2. Stable cardiomegaly   Electronically Signed   By: Oley Balm M.D.   On: 10/16/2013 08:33     ECG: Atrial fib with Ventricular sensing.    Impression and Recommendations  1. Atrial fib with RVR: She is very symptomatic with this, profound fatigue and nausea. She is able to tell when her HR is irregular and fast. She is currently on diltiazem gtt at 10 with metoprolol. She is still having bursts of rapid HR. Consider adding amiodarone and discontinue BB. Will discuss with Dr. Purvis Sheffield as she has lung disease. CHADS VASC Score 5. Continues on Eliquis. 2.5 mg BID, (creatinine 1.5 in the past with CKD by hx,  age greater than 80.) Eliquis on board since 10/10/2013. Discuss possible  DCCV with Dr. Purvis Sheffield.   2. Medtronic BiV CRT-P in situ: Due to be interrogated in Oct. Will have this completed during this admission with review of frequency of mode switches.   3. NICM: Most recent echo completed in 09/2013 demonstrated normal EF of 55%-60%. LA was upper limits of normal.   4. Abnormal CXR: Interstitial lung disease. She is now on albuterol inhalers. Would change to Xopenex as this will have lesser affect on HR.    Signed: Bettey Mare. Shadow Schedler NP  10/17/2013, 7:42 AM Co-Sign  MD

## 2013-10-17 NOTE — Progress Notes (Signed)
[facility] PROGRESS NOTE  Kathleen LazarHallie B [name] WUJ:[id number] DOB: May 23, 1924 DOA: 10/16/2013 PCP: [name],[name], MD  Assessment/Plan: 1. Afib RVR  1. Presenting HR into the 130s, rate controlled on cardizem gtt 2. Lytes stable 3. Had been weaned off cardizem gtt to PO, but gtt restarted overnight secondary to tachycardia 4. Cont eliquis per home regimen 5. Cardiology now consulted 2. Wide complex tachycardia 1. Initially increased metoprolol dose, but pt noted to have failure to pace, thus metoprolol reduced to home dose 3. Hx pacemaker 1. Per overnight events, pt noted to have failure to pace 2. Cardiology consulted 4. HTN  1. Stable 2. Cont bp meds as tolerated 5. DM2  1. Recent a1c of 8.2 2. Will cont pt on SSI coverage 3. Holding amaryl while pt is in hospital 6. HLD  1. Cont statin 7. Cardiomyopathy  1. Normal LVEF on recent 2d Echo 2. Appears stable and euvolemic 8. CKD3  1. Stable 2. Follow renal function 9. DVT prophylaxis  1. On eliquis per above  Code Status: Full Family Communication: Pt in room Disposition Plan: Pending  Consultants:  Cardiolgy  Procedures:    Antibiotics:    HPI/Subjective: Noted to have bouts of tachycardia overnight, requiring resuming cardizem gtt  Objective: Filed Vitals:   10/17/13 0715 10/17/13 0730 10/17/13 0745 10/17/13 0800  BP: 127/62 140/72 119/94   Pulse: 77 99 100   Temp:    98.4 F (36.9 C)  TempSrc:    Oral  Resp: 28 19 15    Height:      Weight:      SpO2: 100% 100% 99%     Intake/Output Summary (Last 24 hours) at 10/17/13 0818 Last data filed at 10/17/13 0600  Gross per 24 hour  Intake 460.22 ml  Output    900 ml  Net -439.78 ml   Filed Weights   10/16/13 0700 10/16/13 1115  Weight: 73.029 kg (161 lb) 72.3 kg (159 lb 6.3 oz)    Exam:   General:  Awake, in nad  Cardiovascular: irregularly, irregular, s1, s2  Respiratory: normal resp effort, no wheezing  Abdomen:  soft,nondistended  Musculoskeletal: perfused, no clubbing   Data Reviewed: Basic Metabolic Panel:  Recent Labs Lab 10/16/13 0720 10/17/13 0443  NA 133* 136*  [name] 4.2 4.0  CL 92* 96  CO2 26 27  GLUCOSE 202* 156*  BUN 15 18  CREATININE 1.21* 1.42*  CALCIUM 9.2 9.1   Liver Function Tests:  Recent Labs Lab 10/17/13 0443  AST 15  ALT 15  ALKPHOS 78  BILITOT 0.6  PROT 6.6  ALBUMIN 3.3*   No results found for this basename: LIPASE, AMYLASE,  in the last 168 hours No results found for this basename: AMMONIA,  in the last 168 hours CBC:  Recent Labs Lab 10/16/13 0720 10/17/13 0443  WBC 8.6 7.3  NEUTROABS 6.0  --   HGB 12.6 11.2*  HCT 36.3 32.7*  MCV 96.3 96.5  PLT 268 254   Cardiac Enzymes:  Recent Labs Lab 10/16/13 0720 10/16/13 2045 10/17/13 0443  TROPONINI <0.30 <0.30 <0.30   BNP (last 3 results)  Recent Labs  10/04/13 0856 10/16/13 0720  PROBNP 398.6 3226.0*   CBG:  Recent Labs Lab 10/16/13 1118 10/16/13 1619 10/16/13 2123 10/17/13 0739  GLUCAP 206* 191* 209* 160*    Recent Results (from the past 240 hour(s))  MRSA PCR SCREENING     Status: None   Collection Time    10/16/13  9:51 AM  Result Value Ref Range Status   MRSA by PCR NEGATIVE  NEGATIVE Final   Comment:            The GeneXpert MRSA Assay (FDA     approved for NASAL specimens     only), is one component of a     comprehensive MRSA colonization     surveillance program. It is not     intended to diagnose MRSA     infection nor to guide or     monitor treatment for     MRSA infections.     Studies: Dg Chest Port 1 View  10/16/2013   CLINICAL DATA:  Pt states she woke up today @ 0600 hrs with a rapid heart rate. NO c/o CP or SOB. Pt states her chest feels full. 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  EXAM: PORTABLE CHEST - 1 VIEW  COMPARISON:  10/04/2013  FINDINGS: Left  subclavian pacemaker stable. Mild cardiomegaly stable. Coarse bronchovascular markings. The bilateral upper lobe interstitial thickening and nodularity is unchanged from previous study. No new infiltrate. No effusion. Visualized skeletal structures are unremarkable.  IMPRESSION: 1. Persistent bilateral upper lobe interstitial nodular and interstitial opacities superimposed on chronic who interstitial lung disease. 2. Stable cardiomegaly   Electronically Signed   By: Oley Balm M.D.   On: 10/16/2013 08:33    Scheduled Meds: . apixaban  2.5 mg Oral BID  . atorvastatin  20 mg Oral q1800  . cholecalciferol  1,000 Units Oral Daily  . diltiazem  180 mg Oral Daily  . insulin aspart  0-15 Units Subcutaneous TID WC  . insulin aspart  0-5 Units Subcutaneous QHS  . metoprolol tartrate  25 mg Oral BID  . pantoprazole  40 mg Oral Daily  . sodium chloride  3 mL Intravenous Q12H   Continuous Infusions: . diltiazem (CARDIZEM) infusion 5 mg/hr (10/17/13 0600)    Principal Problem:   Atrial fibrillation with RVR Active Problems:   Hypertension   DM type 2 (diabetes mellitus, type 2)   Hyperlipidemia   Cardiomyopathy, dilated, nonischemic   CKD (chronic kidney disease) stage 3, GFR 30-59 ml/min   Atrial fibrillation, rapid  Time spent:  Kathleen [name]  [facility] Pager [phone]. If 7PM-7AM, please contact night-coverage at [email or web address], password [facility] 10/17/2013, 8:18 AM  LOS: 1 day

## 2013-10-17 NOTE — Care Management Note (Signed)
    Page 1 of 1   10/17/2013     3:37:19 PM CARE MANAGEMENT NOTE 10/17/2013  Patient:  Kathleen [name], Kathleen [name]   Account Number:  [id number]  Date Initiated:  10/17/2013  Documentation initiated by:  Sharrie Rothman  Subjective/Objective Assessment:   Pt admitted from home with a fib. Pt lives with her daughter and will return home at discharge. Pt is fairly independent with ADL's. Pt has a walkin shower, cane, and BSC.  Pt is active with [facility] PT.     Action/Plan:   Will arrange resumption of [facility] at discharge. No other CM needs noted.   Anticipated DC Date:  10/19/2013   Anticipated DC Plan:  HOME W HOME HEALTH SERVICES      DC Planning Services  CM consult      [facility] Choice  Resumption Of Svcs/PTA Provider   Choice offered to / List presented to:  C-1 Patient        HH arranged  HH-2 PT      [facility] agency  [facility]   Status of service:  Completed, signed off Medicare Important Message given?   (If response is "NO", the following Medicare IM given date fields will be blank) Date Medicare IM given:   Medicare IM given by:   Date Additional Medicare IM given:   Additional Medicare IM given by:    Discharge Disposition:  HOME W HOME HEALTH SERVICES  Per UR Regulation:    If discussed at Long Length of Stay Meetings, dates discussed:    Comments:  10/17/13 1540 Arlyss Queen, RN BSN CM

## 2013-10-17 NOTE — Progress Notes (Signed)
Nutrition Brief Note  Patient identified on the Malnutrition Screening Tool (MST) Report  Wt Readings from Last 15 Encounters:  10/16/13 159 lb 6.3 oz (72.3 kg)  10/10/13 161 lb 14.4 oz (73.437 kg)  10/06/13 168 lb (76.204 kg)  10/05/13 156 lb 4.9 oz (70.9 kg)  04/29/13 164 lb 9.6 oz (74.662 kg)  10/19/12 158 lb 6.4 oz (71.85 kg)  10/17/11 152 lb 12.8 oz (69.31 kg)  10/17/11 152 lb 12.8 oz (69.31 kg)  10/17/11 152 lb 12.8 oz (69.31 kg)  05/13/11 173 lb 15.1 oz (78.9 kg)  03/25/11 166 lb 10.7 oz (75.6 kg)  03/25/11 166 lb 10.7 oz (75.6 kg)  03/15/11 178 lb (80.74 kg)  12/13/10 179 lb 0.2 oz (81.2 kg)   Kathleen Kathleen [name] is a 80 y.o. Kathleen [name]  With a hx of afib, dm2, htn, CAD, HLD who presents to the ED complaints of palpations. Pt reportedly had diltiazem dose recently increased. Pt denies sob, chest pain, diaphoresis or feelings of near syncope. In the ED, pt was noted to have HR into the 130's. She was started on cardizem gtt and hospitalist service consulted for admission.  Pt in with cardiology at time of vist. UBW ranges from 153-160#.   Body mass index is 24.24 kg/(m^2). Patient meets criteria for normal weight range based on current BMI.   Current diet order is Heart Healthy/ Carb Modified, patient is consuming approximately n/a% of meals at this time. Labs and medications reviewed.   No nutrition interventions warranted at this time. If nutrition issues arise, please consult RD.   Kathleen Kathleen [name], RD, LDN Pager: [phone] Kathleen Kathleen [name] is a 80 y.o. Kathleen [name]  With a hx of afib, dm2, htn, CAD, HLD who presents to the ED complaints of palpations. Pt reportedly had diltiazem dose recently increased. Pt denies sob, chest pain, diaphoresis or feelings of near syncope. In the ED, pt was noted to have HR into the 130's. She was started on cardizem gtt and hospitalist service consulted for admission.   Melika Reder A. Mayford Kathleen [name], RD, LDN Pager: [phone]

## 2013-10-17 NOTE — Consult Note (Signed)
The patient was seen and examined, and I agree with the assessment and plan as documented above, with modifications as noted below. Pt with what had been deemed to be a pacemaker-induced cardiomyopathy (normal coronaries) s/p CRT-P with normalization of function, admitted with rapid atrial fibrillation with recent hospitalization in September for same. Currently receiving diltiazem infusion 5 mg/hr, long-acting diltiazem 180 mg daily, and metoprolol 25 mg bid. HR currently better controlled and pt feeling much better.  She has interstitial lung disease with prolonged expiratory phase and wheezing. She and daughter would prefer to avoid cardioversion. Currently on apixaban 2.5 mg bid.  RECS: I will d/c diltiazem infusion and initiate IV amiodarone loading and infusion, with plans for only using this on a short-term basis. Can continue both metoprolol and long-acting diltiazem for the time being. BP currently stable. Will have device interrogated (had been functioning normally on 08/01/13 with 99% Bi-v pacing).

## 2013-10-18 ENCOUNTER — Inpatient Hospital Stay (HOSPITAL_COMMUNITY): Payer: Medicare Other

## 2013-10-18 ENCOUNTER — Telehealth: Payer: Self-pay

## 2013-10-18 DIAGNOSIS — D509 Iron deficiency anemia, unspecified: Secondary | ICD-10-CM

## 2013-10-18 DIAGNOSIS — I2510 Atherosclerotic heart disease of native coronary artery without angina pectoris: Secondary | ICD-10-CM

## 2013-10-18 DIAGNOSIS — I5042 Chronic combined systolic (congestive) and diastolic (congestive) heart failure: Secondary | ICD-10-CM

## 2013-10-18 DIAGNOSIS — I5021 Acute systolic (congestive) heart failure: Secondary | ICD-10-CM

## 2013-10-18 DIAGNOSIS — E1121 Type 2 diabetes mellitus with diabetic nephropathy: Secondary | ICD-10-CM

## 2013-10-18 LAB — GLUCOSE, CAPILLARY
GLUCOSE-CAPILLARY: 158 mg/dL — AB (ref 70–99)
GLUCOSE-CAPILLARY: 207 mg/dL — AB (ref 70–99)
GLUCOSE-CAPILLARY: 223 mg/dL — AB (ref 70–99)
Glucose-Capillary: 136 mg/dL — ABNORMAL HIGH (ref 70–99)

## 2013-10-18 LAB — MAGNESIUM: MAGNESIUM: 1.8 mg/dL (ref 1.5–2.5)

## 2013-10-18 MED ORDER — GUAIFENESIN-DM 100-10 MG/5ML PO SYRP
5.0000 mL | ORAL_SOLUTION | ORAL | Status: DC | PRN
Start: 2013-10-18 — End: 2013-10-20
  Administered 2013-10-18: 5 mL via ORAL
  Filled 2013-10-18: qty 5

## 2013-10-18 MED ORDER — METOPROLOL TARTRATE 50 MG PO TABS
50.0000 mg | ORAL_TABLET | Freq: Two times a day (BID) | ORAL | Status: DC
  Administered 2013-10-18 – 2013-10-20 (×5): 50 mg via ORAL
  Filled 2013-10-18 (×5): qty 1

## 2013-10-18 NOTE — Telephone Encounter (Addendum)
Prior authorization for Eliquis faxed to patient's insurance company via covermymeds. Awaiting approval.

## 2013-10-18 NOTE — Progress Notes (Signed)
Consulting cardiologist: Prentice DockerKoneswaran, Suresh MD Primary Cardiologist:  Royann Shiversroitoru, MD  Subjective:    Feeling better. Less short of breath. No chest pain  Objective:   Temp:  [97.9 F (36.6 C)-98.2 F (36.8 C)] 97.9 F (36.6 C) (10/06 0805) Pulse Rate:  [54-96] 82 (10/06 0300) Resp:  [18-45] 20 (10/06 0300) BP: (98-136)/(46-90) 117/72 mmHg (10/06 0300) SpO2:  [97 %-100 %] 98 % (10/06 0300) Weight:  [165 lb 12.6 oz (75.2 kg)] 165 lb 12.6 oz (75.2 kg) (10/06 0426) Last BM Date: 10/15/13  Filed Weights   10/16/13 0700 10/16/13 1115 10/18/13 0426  Weight: 161 lb (73.029 kg) 159 lb 6.3 oz (72.3 kg) 165 lb 12.6 oz (75.2 kg)    Intake/Output Summary (Last 24 hours) at 10/18/13 0820 Last data filed at 10/18/13 0300  Gross per 24 hour  Intake  133.6 ml  Output    450 ml  Net -316.4 ml    Telemetry: Paced rhythm with episodes of PSVT  Exam:  General: No acute distress.  HEENT: Conjunctiva and lids normal, oropharynx clear.  Lungs: Clear to auscultation, nonlabored.  Cardiac: No elevated JVP or bruits. RRR, no gallop or rub.   Abdomen: Normoactive bowel sounds, nontender, nondistended.  Extremities: No pitting edema, distal pulses full.  Neuropsychiatric: Alert and oriented x3, affect appropriate.   Lab Results:  Basic Metabolic Panel:  Recent Labs Lab 10/16/13 0720 10/17/13 0443  NA 133* 136*  K 4.2 4.0  CL 92* 96  CO2 26 27  GLUCOSE 202* 156*  BUN 15 18  CREATININE 1.21* 1.42*  CALCIUM 9.2 9.1    Liver Function Tests:  Recent Labs Lab 10/17/13 0443  AST 15  ALT 15  ALKPHOS 78  BILITOT 0.6  PROT 6.6  ALBUMIN 3.3*    CBC:  Recent Labs Lab 10/16/13 0720 10/17/13 0443  WBC 8.6 7.3  HGB 12.6 11.2*  HCT 36.3 32.7*  MCV 96.3 96.5  PLT 268 254    Cardiac Enzymes:  Recent Labs Lab 10/16/13 0720 10/16/13 2045 10/17/13 0443  TROPONINI <0.30 <0.30 <0.30    BNP:  Recent Labs  10/04/13 0856 10/16/13 0720  PROBNP 398.6 3226.0*        Medications:   Scheduled Medications: . antiseptic oral rinse  7 mL Mouth Rinse BID  . apixaban  2.5 mg Oral BID  . atorvastatin  20 mg Oral q1800  . cholecalciferol  1,000 Units Oral Daily  . diltiazem  180 mg Oral Daily  . docusate sodium  100 mg Oral Daily  . insulin aspart  0-15 Units Subcutaneous TID WC  . insulin aspart  0-5 Units Subcutaneous QHS  . metoprolol tartrate  25 mg Oral BID  . pantoprazole  40 mg Oral Daily  . sodium chloride  3 mL Intravenous Q12H    Infusions: . amiodarone 30 mg/hr (10/17/13 2251)    PRN Medications: acetaminophen, acetaminophen, albuterol, ondansetron (ZOFRAN) IV, ondansetron, polyethylene glycol   Assessment and Plan:    1. Atrial fib with RVR: Now having periods of PSVT vs rapid afib, rates are up to 110 bpm, but much better heart rate control on amiodarone gtt. Continues on diltiazem 180 mg daily. Can consider increasing, or wait until loading of amiodarone is completed. Anticoagulation with apixaban 2.5 mg BID. CHADS VASC 5. Transition to po amiodarone vs stopping per attending note.    2.BiV Pacemaker in situ: Pacemaker was interrogated yesterday. She was found to be in afib/flutter for 2 weeks. Elevated fluid level,  some CHF. HR has been elevated. Device is functioning appropriately. No VT since admission.   3. NICM: Preserved EF. No further evidence of CHF on exam.    Kathleen [name]. Aloura Matsuoka NP  10/18/2013, 8:20 AM

## 2013-10-18 NOTE — Progress Notes (Addendum)
[facility] PROGRESS NOTE  Kathleen LazarHallie B [name] ZOX:[id number]:[id number] DOB: [birth year] DOA: 10/16/2013 PCP: [name],[name], MD  Off Service Summary [phone] with a hx of afib who presents with afib RVR after failing to respond to an increased dose of her home cardizem. The patient was initially continued on a cardizem gtt with metoprolol increased. The patient was eventually weaned from cardizem gtt but developed RVR yet again. Cardiology was consulted, recommending amiodarone gtt for which she remains on currently. Of note, the patient was noted to have concerns of failure to pace by her pacer. The device has since been interrogated. Again, Cardiology is following.  Assessment/Plan: 1. Afib RVR  1. Presenting HR into the 130s, rate controlled on cardizem gtt 2. Lytes stable 3. Had initially transitioned cardizem gtt to PO, but gtt restarted secondary to recurrent RVR 4. Cardiology was consulted with recs for amiodarone gtt for short term basis 5. Cont eliquis per home regimen 2. Wide complex tachycardia 1. Initially increased metoprolol dose secondary to above, but pt noted to have failure to pace, thus metoprolol was reduced to home dose and Cardiology consulted per above 3. Hx pacemaker 1. Recently noted to have failure to pace 2. Cardiology following. Device has been interrogated 4. HTN  1. Stable 2. Cont bp meds as tolerated 5. DM2  1. Recent a1c of 8.2 2. Will cont pt on SSI coverage 3. Holding amaryl while pt is in hospital 6. HLD  1. Cont statin 7. Cardiomyopathy  1. Normal LVEF on recent 2d Echo 2. Appears stable and euvolemic 8. CKD3  1. Stable 2. Follow renal function closely 9. DVT prophylaxis  1. On eliquis per above  Code Status: Full Family Communication: Pt in room Disposition Plan: Pending  Consultants:  Cardiolgy  Procedures:    Antibiotics:    HPI/Subjective: Feels well this AM  Objective: Filed Vitals:   10/18/13 0100 10/18/13 0200 10/18/13  0300 10/18/13 0426  BP: 116/64 117/65 117/72   Pulse: 75 89 82   Temp:    97.9 F (36.6 C)  TempSrc:    Oral  Resp: 20 21 20    Height:      Weight:    75.2 kg (165 lb 12.6 oz)  SpO2: 99% 98% 98%     Intake/Output Summary (Last 24 hours) at 10/18/13 0747 Last data filed at 10/18/13 0300  Gross per 24 hour  Intake  133.6 ml  Output    450 ml  Net -316.4 ml   Filed Weights   10/16/13 0700 10/16/13 1115 10/18/13 0426  Weight: 73.029 kg (161 lb) 72.3 kg (159 lb 6.3 oz) 75.2 kg (165 lb 12.6 oz)    Exam:   General:  Awake, in nad  Cardiovascular: irregularly, irregular, s1, s2  Respiratory: normal resp effort, no wheezing  Abdomen: soft,nondistended  Musculoskeletal: perfused, no clubbing   Data Reviewed: Basic Metabolic Panel:  Recent Labs Lab 10/16/13 0720 10/17/13 0443  NA 133* 136*  K 4.2 4.0  CL 92* 96  CO2 26 27  GLUCOSE 202* 156*  BUN 15 18  CREATININE 1.21* 1.42*  CALCIUM 9.2 9.1   Liver Function Tests:  Recent Labs Lab 10/17/13 0443  AST 15  ALT 15  ALKPHOS 78  BILITOT 0.6  PROT 6.6  ALBUMIN 3.3*   No results found for this basename: LIPASE, AMYLASE,  in the last 168 hours No results found for this basename: AMMONIA,  in the last 168 hours CBC:  Recent Labs Lab 10/16/13 0720 10/17/13 0443  WBC 8.6 7.3  NEUTROABS 6.0  --   HGB 12.6 11.2*  HCT 36.3 32.7*  MCV 96.3 96.5  PLT 268 254   Cardiac Enzymes:  Recent Labs Lab 10/16/13 0720 10/16/13 2045 10/17/13 0443  TROPONINI <0.30 <0.30 <0.30   BNP (last 3 results)  Recent Labs  10/04/13 0856 10/16/13 0720  PROBNP 398.6 3226.0*   CBG:  Recent Labs Lab 10/16/13 2123 10/17/13 0739 10/17/13 1117 10/17/13 1632 10/17/13 2153  GLUCAP 209* 160* 227* 163* 238*    Recent Results (from the past 240 hour(s))  MRSA PCR SCREENING     Status: None   Collection Time    10/16/13  9:51 AM      Result Value Ref Range Status   MRSA by PCR NEGATIVE  NEGATIVE Final   Comment:             The GeneXpert MRSA Assay (FDA     approved for NASAL specimens     only), is one component of a     comprehensive MRSA colonization     surveillance program. It is not     intended to diagnose MRSA     infection nor to guide or     monitor treatment for     MRSA infections.     Studies: Dg Chest Port 1 View  10/16/2013   CLINICAL DATA:  Pt states she woke up today @ 0600 hrs with a rapid heart rate. NO c/o CP or SOB. Pt states her chest feels full. 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  EXAM: PORTABLE CHEST - 1 VIEW  COMPARISON:  10/04/2013  FINDINGS: Left subclavian pacemaker stable. Mild cardiomegaly stable. Coarse bronchovascular markings. The bilateral upper lobe interstitial thickening and nodularity is unchanged from previous study. No new infiltrate. No effusion. Visualized skeletal structures are unremarkable.  IMPRESSION: 1. Persistent bilateral upper lobe interstitial nodular and interstitial opacities superimposed on chronic who interstitial lung disease. 2. Stable cardiomegaly   Electronically Signed   By: Oley Balm M.D.   On: 10/16/2013 08:33    Scheduled Meds: . antiseptic oral rinse  7 mL Mouth Rinse BID  . apixaban  2.5 mg Oral BID  . atorvastatin  20 mg Oral q1800  . cholecalciferol  1,000 Units Oral Daily  . diltiazem  180 mg Oral Daily  . docusate sodium  100 mg Oral Daily  . insulin aspart  0-15 Units Subcutaneous TID WC  . insulin aspart  0-5 Units Subcutaneous QHS  . metoprolol tartrate  25 mg Oral BID  . pantoprazole  40 mg Oral Daily  . sodium chloride  3 mL Intravenous Q12H   Continuous Infusions: . amiodarone 30 mg/hr (10/17/13 2251)    Principal Problem:   Atrial fibrillation with RVR Active Problems:   Hypertension   DM type 2 (diabetes mellitus, type 2)   Hyperlipidemia   Cardiomyopathy, dilated, nonischemic   CKD (chronic kidney  disease) stage 3, GFR 30-59 ml/min   Atrial fibrillation, rapid  Time spent:  Osaze Hubbert K  [facility] Pager [phone]. If 7PM-7AM, please contact night-coverage at [email or web address], password [facility] 10/18/2013, 7:47 AM  LOS: 2 days

## 2013-10-18 NOTE — Progress Notes (Signed)
Inpatient Diabetes Program Recommendations  AACE/ADA: New Consensus Statement on Inpatient Glycemic Control (2013)  Target Ranges:  Prepandial:   less than 140 mg/dL      Peak postprandial:   less than 180 mg/dL (1-2 hours)      Critically ill patients:  140 - 180 mg/dL   Results for Kathleen [name], Kathleen [name] (MRN [id number]) as of 10/18/2013 09:19  Ref. Range 10/17/2013 07:39 10/17/2013 11:17 10/17/2013 16:32 10/17/2013 21:53 10/18/2013 07:26  Glucose-Capillary Latest Range: 70-99 mg/dL 619 (H) 509 (H) 326 (H) 238 (H) 158 (H)   Diabetes history: DM2 Outpatient Diabetes medications: Amaryl 1 mg as needed if CBG greater than 120 mg/dl Current orders for Inpatient glycemic control: Novolog 0-15 units AC, Novolog 0-5 units HS  Inpatient Diabetes Program Recommendations Insulin - Meal Coverage: May want to consider ordering Novolog 3 units TID wtih meals for meal coverage if patient eats at least 50% of meal.  Thanks, Orlando Penner, RN, MSN, CCRN Diabetes Coordinator Inpatient Diabetes Program [phone] (Team Pager) [phone] ([facility] office) [phone] [facility] office)

## 2013-10-18 NOTE — Progress Notes (Signed)
The patient was seen and examined, and I agree with the assessment and plan as documented above, with modifications as noted below.  Pt with what had been deemed to be a pacemaker-induced cardiomyopathy (normal coronaries) s/p CRT-P with normalization of function, admitted with rapid atrial fibrillation with recent hospitalization in September for same.  Currently receiving amiodarone infusion, long-acting diltiazem 180 mg daily, and metoprolol 25 mg bid.  HR currently better controlled and pt feeling much better. Had 8-beat run of nonsustained VT earlier this morning. She has interstitial lung disease with prolonged expiratory phase and wheezing. She and daughter would prefer to avoid cardioversion.  Currently on apixaban 2.5 mg bid.   RECS:  Continue IV amiodarone loading and infusion, with plans for only using this on a short-term basis (perhaps switch to oral amiodarone for 1-3 months).  Will increase metoprolol to 50 mg bid and 180 mg long-acting diltiazem. BP currently stable.  Device interrogation as noted above. Will check serum magnesium level. K normal yesterday.

## 2013-10-19 ENCOUNTER — Telehealth: Payer: Self-pay | Admitting: *Deleted

## 2013-10-19 DIAGNOSIS — E785 Hyperlipidemia, unspecified: Secondary | ICD-10-CM

## 2013-10-19 DIAGNOSIS — N183 Chronic kidney disease, stage 3 (moderate): Secondary | ICD-10-CM

## 2013-10-19 DIAGNOSIS — R0989 Other specified symptoms and signs involving the circulatory and respiratory systems: Secondary | ICD-10-CM

## 2013-10-19 DIAGNOSIS — I472 Ventricular tachycardia: Secondary | ICD-10-CM

## 2013-10-19 LAB — BASIC METABOLIC PANEL
Anion gap: 11 (ref 5–15)
BUN: 13 mg/dL (ref 6–23)
CO2: 29 meq/L (ref 19–32)
Calcium: 8.6 mg/dL (ref 8.4–10.5)
Chloride: 91 mEq/L — ABNORMAL LOW (ref 96–112)
Creatinine, Ser: 1.27 mg/dL — ABNORMAL HIGH (ref 0.50–1.10)
GFR calc Af Amer: 43 mL/min — ABNORMAL LOW (ref >90)
GFR calc non Af Amer: 37 mL/min — ABNORMAL LOW (ref >90)
GLUCOSE: 151 mg/dL — AB (ref 70–99)
POTASSIUM: 3.8 meq/L (ref 3.7–5.3)
SODIUM: 131 meq/L — AB (ref 137–147)

## 2013-10-19 LAB — GLUCOSE, CAPILLARY
GLUCOSE-CAPILLARY: 161 mg/dL — AB (ref 70–99)
GLUCOSE-CAPILLARY: 171 mg/dL — AB (ref 70–99)
GLUCOSE-CAPILLARY: 148 mg/dL — AB (ref 70–99)
Glucose-Capillary: 158 mg/dL — ABNORMAL HIGH (ref 70–99)

## 2013-10-19 MED ORDER — DILTIAZEM HCL 60 MG PO TABS
60.0000 mg | ORAL_TABLET | Freq: Once | ORAL | Status: AC
  Administered 2013-10-19: 60 mg via ORAL
  Filled 2013-10-19: qty 1

## 2013-10-19 MED ORDER — FUROSEMIDE 10 MG/ML IJ SOLN
40.0000 mg | Freq: Once | INTRAMUSCULAR | Status: AC
  Administered 2013-10-19: 40 mg via INTRAVENOUS
  Filled 2013-10-19: qty 4

## 2013-10-19 MED ORDER — MAGNESIUM OXIDE 400 (241.3 MG) MG PO TABS
400.0000 mg | ORAL_TABLET | Freq: Once | ORAL | Status: AC
  Administered 2013-10-19: 400 mg via ORAL
  Filled 2013-10-19: qty 1

## 2013-10-19 MED ORDER — GLIMEPIRIDE 2 MG PO TABS
2.0000 mg | ORAL_TABLET | Freq: Every day | ORAL | Status: DC
  Filled 2013-10-19: qty 1

## 2013-10-19 MED ORDER — DILTIAZEM HCL ER COATED BEADS 240 MG PO CP24
240.0000 mg | ORAL_CAPSULE | Freq: Every day | ORAL | Status: DC
  Administered 2013-10-20: 240 mg via ORAL
  Filled 2013-10-19: qty 1

## 2013-10-19 MED ORDER — AMIODARONE HCL 200 MG PO TABS
200.0000 mg | ORAL_TABLET | Freq: Two times a day (BID) | ORAL | Status: DC
  Administered 2013-10-19 – 2013-10-20 (×3): 200 mg via ORAL
  Filled 2013-10-19 (×3): qty 1

## 2013-10-19 NOTE — Progress Notes (Signed)
The patient was seen and examined, and I agree with the assessment and plan as documented above, with modifications as noted below.  Pt has a h/o what had been deemed to be a pacemaker-induced cardiomyopathy (normal coronaries) s/p CRT-P with normalization of function, admitted with rapid atrial fibrillation with recent hospitalization in September for same.  Currently receiving amiodarone infusion, long-acting diltiazem 180 mg daily, and metoprolol 50 mg bid.  HR currently better controlled and pt feeling much better. Noted to have some mild pulmonary vascular congestion on xray. She has interstitial lung disease with prolonged expiratory phase and faint, mild wheezing. She and daughter would prefer to avoid cardioversion.  Currently on apixaban 2.5 mg bid.   RECS:  Will switch IV amiodarone to oral, and perhaps use on a short-term basis (1-3 months). Will continue metoprolol 50 mg bid and increase  long-acting diltiazem to 240 mg. BP remains stable.  Device interrogation as noted above.  Received Lasix. Mg low normal yesterday. I will provide one dose of Mg oxide to keep > 2, given bouts of NSVT during this hospitalization.

## 2013-10-19 NOTE — Progress Notes (Signed)
Subjective:  Feeling better.  Objective:  Vital Signs in the last 24 hours: Temp:  [97.7 F (36.5 C)-98.6 F (37 C)] 97.8 F (36.6 C) (10/07 0419) Pulse Rate:  [76-105] 88 (10/07 0900) Resp:  [18-42] 26 (10/07 0900) BP: (105-143)/(59-91) 127/74 mmHg (10/07 0900) SpO2:  [91 %-100 %] 98 % (10/07 0900) Weight:  [167 lb 5.3 oz (75.9 kg)] 167 lb 5.3 oz (75.9 kg) (10/07 0419)  Intake/Output from previous day: 10/06 0701 - 10/07 0700 In: 1333.8 [P.O.:930; I.V.:403.8] Out: 550 [Urine:550] Intake/Output from this shift:    Physical Exam: NECK: Without JVD, HJR, or bruit LUNGS: Decreased breath sounds with scattered crackles. HEART: Iregular rate and rhythm, no murmur, gallop, rub, bruit, thrill, or heave EXTREMITIES: Without cyanosis, clubbing, or edema   Lab Results:  Recent Labs  10/17/13 0443  WBC 7.3  HGB 11.2*  PLT 254    Recent Labs  10/17/13 0443 10/19/13 0409  NA 136* 131*  K 4.0 3.8  CL 96 91*  CO2 27 29  GLUCOSE 156* 151*  BUN 18 13  CREATININE 1.42* 1.27*    Recent Labs  10/16/13 2045 10/17/13 0443  TROPONINI <0.30 <0.30   Hepatic Function Panel  Recent Labs  10/17/13 0443  PROT 6.6  ALBUMIN 3.3*  AST 15  ALT 15  ALKPHOS 78  BILITOT 0.6   No results found for this basename: CHOL,  in the last 72 hours No results found for this basename: PROTIME,  in the last 72 hours  Imaging:   Cardiac Studies:  Assessment/Plan:  1. Atrial fib with RVR:  much better heart rate control on amiodarone gtt. Continues on diltiazem 180 mg daily.  Anticoagulation with apixaban 2.5 mg BID. CHADS VASC 5. Will discuss transition to po amiodarone.  2.BiV Pacemaker in situ: Pacemaker was interrogated yesterday. She was found to be in afib/flutter for 2 weeks. Elevated fluid level, some CHF. HR has been elevated. Device is functioning appropriately. No VT since admission.   3. NICM: Preserved EF. No further evidence of CHF on exam.      LOS: 3 days     Kathleen [name] 10/19/2013, 9:23 AM

## 2013-10-19 NOTE — Progress Notes (Signed)
[facility] PROGRESS NOTE  Kathleen LazarHallie B Manera ZOX:[id number] DOB: [birth year] DOA: 10/16/2013 PCP: [name],[name], MD  Assessment/Plan: A Fib with RVR -HR is now controlled on IV amio, PO cardizem and PO metoprolol. -Anticoagulated on eliquis. -Will discuss with cards whether feasible to DC IV amio today. -Has some crackles on lung exam and CXR with mild pulmonary edema; will give lasix 40 mg x 1 today.  Wide Complex Tachycardia -Continue metoprolol. -Has a pacer. -Cards has been consulted. -EF of 55-60% per ECHO in 9/15.  HTN -Well controlled.  DM II -CBGs above goal. -Restart home amaryl. -Continue SSI.  Hyperlipidemia -Continue statin.  CKD Stage III -Cr at baseline of around 1.4.  Code Status: Full Code Family Communication: Daughter Dois DavenportSandra at bedside updated on plan of care  Disposition Plan: Keep in ICU today pending cards decision on IV amiodarone.   Consultants:  Cardiology   Antibiotics:  None   Subjective: Feels better.  Objective: Filed Vitals:   10/19/13 0645 10/19/13 0700 10/19/13 0800 10/19/13 0900  BP: 128/71 141/77 143/75 127/74  Pulse: 89 93 95 88  Temp:      TempSrc:      Resp: 30 34 31 26  Height:      Weight:      SpO2: 99% 99% 99% 98%    Intake/Output Summary (Last 24 hours) at 10/19/13 0932 Last data filed at 10/19/13 0300  Gross per 24 hour  Intake 1093.8 ml  Output    550 ml  Net  543.8 ml   Filed Weights   10/16/13 1115 10/18/13 0426 10/19/13 0419  Weight: 72.3 kg (159 lb 6.3 oz) 75.2 kg (165 lb 12.6 oz) 75.9 kg (167 lb 5.3 oz)    Exam:   General:  AA Ox3  Cardiovascular: RRR  Respiratory: Bilateral crackles  Abdomen: S/NT/ND/+BS  Extremities: trace pitting edema   Neurologic:  Intact/non-focal  Data Reviewed: Basic Metabolic Panel:  Recent Labs Lab 10/16/13 0720 10/17/13 0443 10/18/13 1104 10/19/13 0409  NA 133* 136*  --  131*  K 4.2 4.0  --  3.8  CL 92* 96  --  91*  CO2 26 27   --  29  GLUCOSE 202* 156*  --  151*  BUN 15 18  --  13  CREATININE 1.21* 1.42*  --  1.27*  CALCIUM 9.2 9.1  --  8.6  MG  --   --  1.8  --    Liver Function Tests:  Recent Labs Lab 10/17/13 0443  AST 15  ALT 15  ALKPHOS 78  BILITOT 0.6  PROT 6.6  ALBUMIN 3.3*   No results found for this basename: LIPASE, AMYLASE,  in the last 168 hours No results found for this basename: AMMONIA,  in the last 168 hours CBC:  Recent Labs Lab 10/16/13 0720 10/17/13 0443  WBC 8.6 7.3  NEUTROABS 6.0  --   HGB 12.6 11.2*  HCT 36.3 32.7*  MCV 96.3 96.5  PLT 268 254   Cardiac Enzymes:  Recent Labs Lab 10/16/13 0720 10/16/13 2045 10/17/13 0443  TROPONINI <0.30 <0.30 <0.30   BNP (last 3 results)  Recent Labs  10/04/13 0856 10/16/13 0720  PROBNP 398.6 3226.0*   CBG:  Recent Labs Lab 10/18/13 0726 10/18/13 1210 10/18/13 1626 10/18/13 2119 10/19/13 0747  GLUCAP 158* 207* 136* 223* 161*    Recent Results (from the past 240 hour(s))  MRSA PCR SCREENING     Status: None   Collection Time  10/16/13  9:51 AM      Result Value Ref Range Status   MRSA by PCR NEGATIVE  NEGATIVE Final   Comment:            The GeneXpert MRSA Assay (FDA     approved for NASAL specimens     only), is one component of a     comprehensive MRSA colonization     surveillance program. It is not     intended to diagnose MRSA     infection nor to guide or     monitor treatment for     MRSA infections.     Studies: Dg Chest Port 1 View  10/19/2013   CLINICAL DATA:  Acute onset of cough and congestion. Initial encounter.  EXAM: PORTABLE CHEST - 1 VIEW  COMPARISON:  Chest radiograph performed 10/16/2013  FINDINGS: The lungs are hyperexpanded, with flattening of the hemidiaphragms, compatible with COPD. Increased interstitial markings are noted, with underlying vascular congestion, concerning for mild pulmonary edema. Small bilateral pleural effusions are suspected. No pneumothorax is seen.  The  cardiomediastinal silhouette is enlarged. The pacemaker is noted overlying the left chest wall, extending overlying the right atrium, right ventricle and coronary sinus. No acute osseous abnormalities are seen.  IMPRESSION: 1. Increased interstitial markings, with underlying vascular congestion and cardiomegaly, concerning for mild pulmonary edema. Suspect small bilateral pleural effusions. 2. Findings of COPD.   Electronically Signed   By: Roanna Raider M.D.   On: 10/19/2013 05:16    Scheduled Meds: . antiseptic oral rinse  7 mL Mouth Rinse BID  . apixaban  2.5 mg Oral BID  . atorvastatin  20 mg Oral q1800  . cholecalciferol  1,000 Units Oral Daily  . diltiazem  180 mg Oral Daily  . docusate sodium  100 mg Oral Daily  . insulin aspart  0-15 Units Subcutaneous TID WC  . insulin aspart  0-5 Units Subcutaneous QHS  . metoprolol tartrate  50 mg Oral BID  . pantoprazole  40 mg Oral Daily  . sodium chloride  3 mL Intravenous Q12H   Continuous Infusions: . amiodarone 30 mg/hr (10/19/13 0300)    Principal Problem:   Atrial fibrillation with RVR Active Problems:   Hypertension   DM type 2 (diabetes mellitus, type 2)   Hyperlipidemia   Cardiomyopathy, dilated, nonischemic   CKD (chronic kidney disease) stage 3, GFR 30-59 ml/min    Time spent: 40 minutes. Greater than 50% of this time was spent in direct contact with the patient coordinating care.    Chaya Jan  [facility] Pager [phone]  If 7PM-7AM, please contact night-coverage at [email or web address], password [facility] 10/19/2013, 9:32 AM  LOS: 3 days

## 2013-10-19 NOTE — Telephone Encounter (Signed)
Eliquis approved 10/18/2013 through 10/18/2014 by [employer].

## 2013-10-20 ENCOUNTER — Telehealth: Payer: Self-pay | Admitting: Pharmacist Clinician (PhC)/ Clinical Pharmacy Specialist

## 2013-10-20 LAB — BASIC METABOLIC PANEL
ANION GAP: 12 (ref 5–15)
BUN: 12 mg/dL (ref 6–23)
CALCIUM: 8.8 mg/dL (ref 8.4–10.5)
CO2: 30 mEq/L (ref 19–32)
CREATININE: 1.18 mg/dL — AB (ref 0.50–1.10)
Chloride: 93 mEq/L — ABNORMAL LOW (ref 96–112)
GFR, EST AFRICAN AMERICAN: 47 mL/min — AB (ref >90)
GFR, EST NON AFRICAN AMERICAN: 41 mL/min — AB (ref >90)
Glucose, Bld: 156 mg/dL — ABNORMAL HIGH (ref 70–99)
Potassium: 3.6 mEq/L — ABNORMAL LOW (ref 3.7–5.3)
Sodium: 135 mEq/L — ABNORMAL LOW (ref 137–147)

## 2013-10-20 LAB — GLUCOSE, CAPILLARY: Glucose-Capillary: 159 mg/dL — ABNORMAL HIGH (ref 70–99)

## 2013-10-20 MED ORDER — GLIMEPIRIDE 1 MG PO TABS: 0.5000 mg | ORAL_TABLET | Freq: Every day | ORAL | Status: DC

## 2013-10-20 MED ORDER — METOPROLOL TARTRATE 50 MG PO TABS: 50.0000 mg | ORAL_TABLET | Freq: Two times a day (BID) | ORAL | Status: DC

## 2013-10-20 MED ORDER — DILTIAZEM HCL ER COATED BEADS 240 MG PO CP24: 240.0000 mg | ORAL_CAPSULE | Freq: Every day | ORAL | Status: DC

## 2013-10-20 MED ORDER — AMIODARONE HCL 200 MG PO TABS: 200.0000 mg | ORAL_TABLET | Freq: Two times a day (BID) | ORAL | Status: DC

## 2013-10-20 NOTE — Telephone Encounter (Signed)
PA for Eliquis faxed to [facility] Wed Oct 7

## 2013-10-20 NOTE — Discharge Summary (Signed)
Physician Discharge Summary  Kathleen [name] ZOX:[id number] DOB: [birth year] DOA: 10/16/2013  PCP: [name],[name], MD  Admit date: 10/16/2013 Discharge date: 10/20/2013  Time spent: 45 minutes  Recommendations for Outpatient Follow-up:  - Will be discharged home today. -Advised to followup with her cardiologist as scheduled on October 12.  Discharge Diagnoses:  Principal Problem:   Atrial fibrillation with RVR Active Problems:   Hypertension   DM type 2 (diabetes mellitus, type 2)   Hyperlipidemia   Cardiomyopathy, dilated, nonischemic   CKD (chronic kidney disease) stage 3, GFR 30-59 ml/min   Discharge Condition: Stable and improved  Filed Weights   10/18/13 0426 10/19/13 0419 10/20/13 0500  Weight: 75.2 kg (165 lb 12.6 oz) 75.9 kg (167 lb 5.3 oz) 74.3 kg (163 lb 12.8 oz)    History of present illness:  Kathleen [name] is a 80 y.o. female  With a hx of afib, dm2, htn, CAD, HLD who presents to the ED complaints of palpations. Pt reportedly had diltiazem dose recently increased. Pt denies sob, chest pain, diaphoresis or feelings of near syncope. In the ED, pt was noted to have HR into the 130's. She was started on cardizem gtt and hospitalist service consulted for admission.   Hospital Course:   A Fib with RVR  -HR controlled on by mouth metoprolol, diltiazem and amiodarone. -Anticoagulated on eliquis.   Wide Complex Tachycardia  -Continue metoprolol at increased dose. -Has a pacer.  -EF of 55-60% per ECHO in 9/15.   HTN  -Well controlled.   DM II  -CBGs above goal.  -Restart home amaryl.  -Continue followup in the outpatient setting.  Hyperlipidemia  -Continue statin.   CKD Stage III  -Cr at baseline of around 1.4.      Procedures:  None   Consultations:  Cardiology  Discharge Instructions  Discharge Instructions   Diet - low sodium heart healthy    Complete by:  As directed      Discontinue IV    Complete by:  As directed      Increase activity slowly    Complete by:  As directed             Medication List    STOP taking these medications       nitrofurantoin (macrocrystal-monohydrate) 100 MG capsule  Commonly known as:  MACROBID      TAKE these medications       albuterol 108 (90 BASE) MCG/ACT inhaler  Commonly known as:  PROVENTIL HFA;VENTOLIN HFA  Inhale 2 puffs into the lungs every 6 (six) hours as needed for wheezing or shortness of breath.     amiodarone 200 MG tablet  Commonly known as:  PACERONE  Take 1 tablet (200 mg total) by mouth 2 (two) times daily.     apixaban 2.5 MG Tabs tablet  Commonly known as:  ELIQUIS  Take 1 tablet (2.5 mg total) by mouth 2 (two) times daily.     CENTRUM SILVER PO  Take 1 tablet by mouth daily.     cholecalciferol 1000 UNITS tablet  Commonly known as:  VITAMIN D  Take 1,000 Units by mouth daily.     diltiazem 240 MG 24 hr capsule  Commonly known as:  CARDIZEM CD  Take 1 capsule (240 mg total) by mouth daily.     docusate sodium 100 MG capsule  Commonly known as:  COLACE  Take 100 mg by mouth 2 (two) times daily as needed for mild constipation.  glimepiride 1 MG tablet  Commonly known as:  AMARYL  Take 0.5 tablets (0.5 mg total) by mouth daily with breakfast.     metoprolol 50 MG tablet  Commonly known as:  LOPRESSOR  Take 1 tablet (50 mg total) by mouth 2 (two) times daily.     omeprazole 20 MG capsule  Commonly known as:  PRILOSEC  Take 1 capsule (20 mg total) by mouth daily.     simvastatin 40 MG tablet  Commonly known as:  ZOCOR  Take 1 tablet (40 mg total) by mouth at bedtime.       Allergies  Allergen Reactions  . Coumadin [Warfarin Sodium] Other (See Comments)    Caused Patient to Bleed Out.   . Meloxicam Other (See Comments)    Unknown  . Metformin And Related Other (See Comments)    Cannot have due to kidney function  . Morphine Itching    Not in right state of mind.   . Nabumetone Nausea And Vomiting  . Sulfonamide  Derivatives Hives       Follow-up Information   Follow up with [name]., MD On 10/27/2013. (@ 12:30. )    Specialty:  Internal Medicine   Contact information:   [address] [place] [address] [phone]        The results of significant diagnostics from this hospitalization (including imaging, microbiology, ancillary and laboratory) are listed below for reference.    Significant Diagnostic Studies: Dg Chest Port 1 View  10/19/2013   CLINICAL DATA:  Acute onset of cough and congestion. Initial encounter.  EXAM: PORTABLE CHEST - 1 VIEW  COMPARISON:  Chest radiograph performed 10/16/2013  FINDINGS: The lungs are hyperexpanded, with flattening of the hemidiaphragms, compatible with COPD. Increased interstitial markings are noted, with underlying vascular congestion, concerning for mild pulmonary edema. Small bilateral pleural effusions are suspected. No pneumothorax is seen.  The cardiomediastinal silhouette is enlarged. The pacemaker is noted overlying the left chest wall, extending overlying the right atrium, right ventricle and coronary sinus. No acute osseous abnormalities are seen.  IMPRESSION: 1. Increased interstitial markings, with underlying vascular congestion and cardiomegaly, concerning for mild pulmonary edema. Suspect small bilateral pleural effusions. 2. Findings of COPD.   Electronically Signed   By: Roanna Raider M.D.   On: 10/19/2013 05:16   Dg Chest Port 1 View  10/16/2013   CLINICAL DATA:  Pt states she woke up today @ 0600 hrs with a rapid heart rate. NO c/o CP or SOB. Pt states her chest feels full. 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  EXAM: PORTABLE CHEST - 1 VIEW  COMPARISON:  10/04/2013  FINDINGS: Left subclavian pacemaker stable. Mild cardiomegaly stable. Coarse bronchovascular markings. The bilateral upper lobe interstitial thickening and  nodularity is unchanged from previous study. No new infiltrate. No effusion. Visualized skeletal structures are unremarkable.  IMPRESSION: 1. Persistent bilateral upper lobe interstitial nodular and interstitial opacities superimposed on chronic who interstitial lung disease. 2. Stable cardiomegaly   Electronically Signed   By: Oley Balm M.D.   On: 10/16/2013 08:33   Dg Chest Portable 1 View  10/04/2013   CLINICAL DATA:  TACHYCARDIA  EXAM: PORTABLE CHEST - 1 VIEW  COMPARISON:  10/17/2011  FINDINGS: Left subclavian 2 lead pacer noted. Mild cardiomegaly with slight vascular congestion compared to the prior study. Background COPD/ emphysema noted with apical pleural parenchymal scarring. No definite focal pneumonia, collapse  or consolidation. No effusion or pneumothorax. Bones are osteopenic.  IMPRESSION: Cardiomegaly with mild vascular congestion  Background COPD/emphysema with apical pleural parenchymal scarring.   Electronically Signed   By: Ruel Favors M.D.   On: 10/04/2013 08:54    Microbiology: Recent Results (from the past 240 hour(s))  MRSA PCR SCREENING     Status: None   Collection Time    10/16/13  9:51 AM      Result Value Ref Range Status   MRSA by PCR NEGATIVE  NEGATIVE Final   Comment:            The GeneXpert MRSA Assay (FDA     approved for NASAL specimens     only), is one component of a     comprehensive MRSA colonization     surveillance program. It is not     intended to diagnose MRSA     infection nor to guide or     monitor treatment for     MRSA infections.     Labs: Basic Metabolic Panel:  Recent Labs Lab 10/16/13 0720 10/17/13 0443 10/18/13 1104 10/19/13 0409 10/20/13 0510  NA 133* 136*  --  131* 135*  K 4.2 4.0  --  3.8 3.6*  CL 92* 96  --  91* 93*  CO2 26 27  --  29 30  GLUCOSE 202* 156*  --  151* 156*  BUN 15 18  --  13 12  CREATININE 1.21* 1.42*  --  1.27* 1.18*  CALCIUM 9.2 9.1  --  8.6 8.8  MG  --   --  1.8  --   --    Liver Function  Tests:  Recent Labs Lab 10/17/13 0443  AST 15  ALT 15  ALKPHOS 78  BILITOT 0.6  PROT 6.6  ALBUMIN 3.3*   No results found for this basename: LIPASE, AMYLASE,  in the last 168 hours No results found for this basename: AMMONIA,  in the last 168 hours CBC:  Recent Labs Lab 10/16/13 0720 10/17/13 0443  WBC 8.6 7.3  NEUTROABS 6.0  --   HGB 12.6 11.2*  HCT 36.3 32.7*  MCV 96.3 96.5  PLT 268 254   Cardiac Enzymes:  Recent Labs Lab 10/16/13 0720 10/16/13 2045 10/17/13 0443  TROPONINI <0.30 <0.30 <0.30   BNP: BNP (last 3 results)  Recent Labs  10/04/13 0856 10/16/13 0720  PROBNP 398.6 3226.0*   CBG:  Recent Labs Lab 10/19/13 0747 10/19/13 1120 10/19/13 1636 10/19/13 2120 10/20/13 0741  GLUCAP 161* 171* 148* 158* 159*       Signed:  HERNANDEZ ACOSTA,ESTELA  [facility] Pager: [phone] 10/20/2013, 6:49 PM

## 2013-10-20 NOTE — Progress Notes (Signed)
Patient given discharge instructions with all questions answered. Daughter at bedside. Patient and family left in stable condition via personal vehicle.

## 2013-10-21 ENCOUNTER — Telehealth: Payer: Self-pay | Admitting: *Deleted

## 2013-10-21 NOTE — Telephone Encounter (Signed)
eliquis approved by [employer].

## 2013-10-24 ENCOUNTER — Encounter: Payer: Self-pay | Admitting: Cardiovascular Disease

## 2013-10-24 ENCOUNTER — Ambulatory Visit (INDEPENDENT_AMBULATORY_CARE_PROVIDER_SITE_OTHER): Payer: Medicare Other | Admitting: Cardiovascular Disease

## 2013-10-24 ENCOUNTER — Ambulatory Visit (INDEPENDENT_AMBULATORY_CARE_PROVIDER_SITE_OTHER): Payer: Medicare Other | Admitting: *Deleted

## 2013-10-24 VITALS — BP 114/74 | HR 87 | Resp 20 | Ht 68.0 in | Wt 162.6 lb

## 2013-10-24 DIAGNOSIS — I2510 Atherosclerotic heart disease of native coronary artery without angina pectoris: Secondary | ICD-10-CM

## 2013-10-24 DIAGNOSIS — I420 Dilated cardiomyopathy: Secondary | ICD-10-CM

## 2013-10-24 DIAGNOSIS — R5381 Other malaise: Secondary | ICD-10-CM

## 2013-10-24 DIAGNOSIS — I429 Cardiomyopathy, unspecified: Secondary | ICD-10-CM

## 2013-10-24 DIAGNOSIS — I4891 Unspecified atrial fibrillation: Secondary | ICD-10-CM

## 2013-10-24 DIAGNOSIS — I441 Atrioventricular block, second degree: Secondary | ICD-10-CM

## 2013-10-24 DIAGNOSIS — I480 Paroxysmal atrial fibrillation: Secondary | ICD-10-CM

## 2013-10-24 DIAGNOSIS — I5021 Acute systolic (congestive) heart failure: Secondary | ICD-10-CM

## 2013-10-24 DIAGNOSIS — I5042 Chronic combined systolic (congestive) and diastolic (congestive) heart failure: Secondary | ICD-10-CM

## 2013-10-24 DIAGNOSIS — D689 Coagulation defect, unspecified: Secondary | ICD-10-CM

## 2013-10-24 DIAGNOSIS — Z79899 Other long term (current) drug therapy: Secondary | ICD-10-CM

## 2013-10-24 LAB — MDC_IDC_ENUM_SESS_TYPE_INCLINIC
Battery Voltage: 3 V
Brady Statistic AP VP Percent: 1.4 %
Brady Statistic AS VP Percent: 62.7 %
Lead Channel Impedance Value: 399 Ohm
Lead Channel Impedance Value: 494 Ohm
Lead Channel Pacing Threshold Amplitude: 0.75 V
Lead Channel Pacing Threshold Amplitude: 1.25 V
Lead Channel Pacing Threshold Pulse Width: 0.4 ms
Lead Channel Pacing Threshold Pulse Width: 0.4 ms
Lead Channel Sensing Intrinsic Amplitude: 2.4 mV
Lead Channel Setting Pacing Amplitude: 2 V
Lead Channel Setting Pacing Amplitude: 2 V
Lead Channel Setting Pacing Amplitude: 2 V
Lead Channel Setting Pacing Pulse Width: 0.4 ms
Lead Channel Setting Sensing Sensitivity: 4 mV
MDC IDC MSMT LEADCHNL LV IMPEDANCE VALUE: 608 Ohm
MDC IDC MSMT LEADCHNL RV SENSING INTR AMPL: 20 mV
MDC IDC SET LEADCHNL RV PACING PULSEWIDTH: 0.4 ms
MDC IDC STAT BRADY AP VS PERCENT: 0.2 %
MDC IDC STAT BRADY AS VS PERCENT: 35.7 %
Zone Setting Detection Interval: 350 ms
Zone Setting Detection Interval: 400 ms

## 2013-10-24 LAB — BASIC METABOLIC PANEL
BUN: 12 mg/dL (ref 6–23)
CHLORIDE: 95 meq/L — AB (ref 96–112)
CO2: 25 meq/L (ref 19–32)
Calcium: 9.2 mg/dL (ref 8.4–10.5)
Creat: 1.37 mg/dL — ABNORMAL HIGH (ref 0.50–1.10)
Glucose, Bld: 193 mg/dL — ABNORMAL HIGH (ref 70–99)
POTASSIUM: 3.9 meq/L (ref 3.5–5.3)
SODIUM: 132 meq/L — AB (ref 135–145)

## 2013-10-24 LAB — CBC
HEMATOCRIT: 33.4 % — AB (ref 36.0–46.0)
HEMOGLOBIN: 11.7 g/dL — AB (ref 12.0–15.0)
MCH: 33.1 pg (ref 26.0–34.0)
MCHC: 35 g/dL (ref 30.0–36.0)
MCV: 94.6 fL (ref 78.0–100.0)
Platelets: 295 10*3/uL (ref 150–400)
RBC: 3.53 MIL/uL — AB (ref 3.87–5.11)
RDW: 13.9 % (ref 11.5–15.5)
WBC: 7.6 10*3/uL (ref 4.0–10.5)

## 2013-10-24 NOTE — Progress Notes (Signed)
Patient ID: Kathleen [name], female   DOB: 06/09/1924, 80 y.o.   MRN: [id number]     Reason for office visit Atrial fibrillation, CHF, CRT-P  Mrs. Desautel was discharged from the hospital on 10/08, after being admitted for atrial fibrillation with RVR and symptoms and signs of acute exacerbation of CHF. She was previously admitted for a similar scenario in September. Amiodarone was started during that first admission. Ef remains good at 55-60%.  She was started on anticoagulation with Eliquis  2 weeks ago. In the past she had problems with bleeding (hemarthrosis) while on warfarin and anticoagulation was eventually stopped due to falls. Her CHADSVasc score is 6, but she has not had embolic events. She does not really want to stay on anticoagulants long term.  Rate control is now fair. Despite better rate control, she remains quite symptomatic, dyspneic and tired. She has also developed ankle swelling. NYHA class III.  Device interrogated today. Atrial fibrillation has persisted since early September. There has been a drastic reduction in CRT pacing. Even after optimal rate control, there is only 64% BiV pacing. Starting October 1, her Optivol index suggests increased filling pressures. Activity levels have decreased markedly.  She has a nonischemic cardiomyopathy that became very apparent following implantation of a dual-chamber permanent pacemaker with pacing induced ventricular asynchrony secondary to high grade atrioventricular block. Her ejection fraction dropped as low as 35%. She had a marked clinical response to initiation of biventricular pacing. She does not have significant CAD by angio 2013.  After pacemaker implantation there has been no detected atrial fibrillation, although occasional paroxysmal atrial tachycardia has been seen.  She has mild chronic kidney disease, stage III with baseline creatinine around 1.5. She has treated DM type 2, hypertension and  hyperlipidemia.   Allergies  Allergen Reactions  . Coumadin [Warfarin Sodium] Other (See Comments)    Caused Patient to Bleed Out.   . Meloxicam Other (See Comments)    Unknown  . Metformin And Related Other (See Comments)    Cannot have due to kidney function  . Morphine Itching    Not in right state of mind.   . Nabumetone Nausea And Vomiting  . Sulfonamide Derivatives Hives    Current Outpatient Prescriptions  Medication Sig Dispense Refill  . amiodarone (PACERONE) 200 MG tablet Take 1 tablet (200 mg total) by mouth 2 (two) times daily.  60 tablet  1  . apixaban (ELIQUIS) 2.5 MG TABS tablet Take 1 tablet (2.5 mg total) by mouth 2 (two) times daily.  60 tablet  6  . cholecalciferol (VITAMIN D) 1000 UNITS tablet Take 1,000 Units by mouth daily.      . diltiazem (CARDIZEM CD) 240 MG 24 hr capsule Take 1 capsule (240 mg total) by mouth daily.  30 capsule  1  . docusate sodium (COLACE) 100 MG capsule Take 100 mg by mouth 2 (two) times daily as needed for mild constipation.       . glimepiride (AMARYL) 1 MG tablet Take 0.5 tablets (0.5 mg total) by mouth daily with breakfast.  30 tablet  1  . metoprolol (LOPRESSOR) 50 MG tablet Take 1 tablet (50 mg total) by mouth 2 (two) times daily.  30 tablet  1  . Multiple Vitamins-Minerals (CENTRUM SILVER PO) Take 1 tablet by mouth daily.      . omeprazole (PRILOSEC) 20 MG capsule Take 1 capsule (20 mg total) by mouth daily.  30 capsule  6  . ondansetron (ZOFRAN) 4 MG tablet Take   4 mg by mouth every 4 (four) hours as needed for nausea or vomiting.      . simvastatin (ZOCOR) 40 MG tablet Take 1 tablet (40 mg total) by mouth at bedtime.  30 tablet  7  . furosemide (LASIX) 40 MG tablet Take 1 tablet (40 mg total) by mouth daily.  30 tablet  1  . potassium chloride SA (K-DUR,KLOR-CON) 20 MEQ tablet Take 1 tablet (20 mEq total) by mouth daily.  30 tablet  1  . [DISCONTINUED] benazepril (LOTENSIN) 40 MG tablet Take 40 mg by mouth 2 (two) times daily.        No current facility-administered medications for this visit.    Past Medical History  Diagnosis Date  . Hypertension   . Diabetes mellitus   . High cholesterol   . Atrial fibrillation   . Paroxysmal a-fib 12/13/2010  . PNA (pneumonia)   . CAD (coronary artery disease) 2006  . Acid reflux   . AV block, 2nd degree     S/p Pacemaker 03/2011  . CHF (congestive heart failure)   . Shortness of breath   . Pacemaker 10/16/2011    medtronic  . Nonischemic cardiomyopathy   . Chronic kidney disease (CKD), stage III (moderate)     Past Surgical History  Procedure Laterality Date  . Replacement total knee    . Bladder tact  1970's  . Pacemaker insertion  10/16/2011    Medtronic  . Cholecystectomy  1999  . Bladder tack    . Nm myocar perf wall motion  03/15/2009    Normal  . Cardiac catheterization  10/15/2011    Normal coronaries    Family History  Problem Relation Age of Onset  . Heart murmur Daughter   . Suicidality Mother   . Heart attack Brother     History   Social History  . Marital Status: Widowed    Spouse Name: N/A    Number of Children: N/A  . Years of Education: N/A   Occupational History  . Not on file.   Social History Main Topics  . Smoking status: Never Smoker   . Smokeless tobacco: Never Used  . Alcohol Use: No  . Drug Use: No  . Sexual Activity: Not Currently   Other Topics Concern  . Not on file   Social History Narrative  . No narrative on file    Review of systems: The patient specifically denies any chest pain at rest or with exertion, dyspnea at rest, orthopnea, paroxysmal nocturnal dyspnea, syncope, palpitations, focal neurological deficits, intermittent claudication, lower extremity edema, unexplained weight gain, cough, hemoptysis or wheezing.  The patient also denies abdominal pain, nausea, vomiting, dysphagia, diarrhea, constipation, polyuria, polydipsia, dysuria, hematuria, frequency, urgency, abnormal bleeding or bruising, fever,  chills, unexpected weight changes, mood swings, change in skin or hair texture, change in voice quality, auditory or visual problems, allergic reactions or rashes, new musculoskeletal complaints other than usual "aches and pains".    PHYSICAL EXAM BP 114/74  Pulse 87  Resp 20  Ht 5' 8" (1.727 m)  Wt 73.755 kg (162 lb 9.6 oz)  BMI 24.73 kg/m2 General: Alert, oriented x3, no distress  Head: no evidence of trauma, PERRL, EOMI, no exophtalmos or lid lag, no myxedema, no xanthelasma; normal ears, nose and oropharynx  Neck: normal jugular venous pulsations and no hepatojugular reflux; brisk carotid pulses without delay and no carotid bruits  Chest: clear to auscultation, no signs of consolidation by percussion or palpation, normal fremitus, symmetrical   and full respiratory excursions; healthy left subclavian pacemaker site  Cardiovascular: normal position and quality of the apical impulse, regular rhythm, normal first and paradoxically split second heart sounds, no murmurs, rubs or gallops  Abdomen: no tenderness or distention, no masses by palpation, no abnormal pulsatility or arterial bruits, normal bowel sounds, no hepatosplenomegaly  Extremities: no clubbing, cyanosis or edema; 2+ radial, ulnar and brachial pulses bilaterally; 2+ right femoral, posterior tibial and dorsalis pedis pulses; 2+ left femoral, posterior tibial and dorsalis pedis pulses; no subclavian or femoral bruits  Neurological: grossly nonfocal   EKG: Atrial sensed, 50% biventricular paced with a right bundle branch block QRS morpholog  Lipid Panel     Component Value Date/Time   CHOL 149 10/04/2013 0843   TRIG 110 10/04/2013 0843   HDL 79 10/04/2013 0843   CHOLHDL 1.9 10/04/2013 0843   VLDL 22 10/04/2013 0843   LDLCALC 48 10/04/2013 0843    BMET    Component Value Date/Time   NA 132* 10/24/2013 1628   K 3.9 10/24/2013 1628   CL 95* 10/24/2013 1628   CO2 25 10/24/2013 1628   GLUCOSE 193* 10/24/2013 1628   BUN 12  10/24/2013 1628   CREATININE 1.37* 10/24/2013 1628   CREATININE 1.18* 10/20/2013 0510   CALCIUM 9.2 10/24/2013 1628   GFRNONAA 41* 10/20/2013 0510   GFRAA 47* 10/20/2013 0510     ASSESSMENT AND PLAN  Mrs. Curnow has acute CHF exacerbation due to persistent atrial fibrillation and loss of CRT, despite adequate rate control. She is tolerating anticoagulation so far, but has had bleeding complications in the past and may not be a good candidate for long term anticoagulation. She has now been on anticoagulation for over two weeks, soon will be ready for cardioversion. We discussed this procedure in detail. I think it gives her the best chance for symptom relief and return of quality of life. She agrees to proceed.  Orders Placed This Encounter  Procedures  . Cardioversion (Bedside)  . APTT  . Basic metabolic panel  . Protime-INR  . CBC  . EKG 12-Lead   Meds ordered this encounter  Medications  . ondansetron (ZOFRAN) 4 MG tablet    Sig: Take 4 mg by mouth every 4 (four) hours as needed for nausea or vomiting.    Shebra Muldrow  Anda Sobotta, MD, FACC [facility] [phone] office [phone] pager   

## 2013-10-24 NOTE — Patient Instructions (Signed)
Your physician has recommended that you have a Cardioversion  Friday 10/28/13. Electrical Cardioversion uses a jolt of electricity to your heart either through paddles or wired patches attached to your chest. This is a controlled, usually prescheduled, procedure. Defibrillation is done under light anesthesia in the hospital, and you usually go home the day of the procedure. This is done to get your heart back into a normal rhythm. You are not awake for the procedure. Please see the instruction sheet given to you today  Your physician recommends that you return for lab work in: Today at [place] lab.

## 2013-10-25 ENCOUNTER — Encounter: Payer: Self-pay | Admitting: Cardiovascular Disease

## 2013-10-25 ENCOUNTER — Other Ambulatory Visit: Payer: Self-pay | Admitting: *Deleted

## 2013-10-25 ENCOUNTER — Telehealth: Payer: Self-pay | Admitting: Cardiovascular Disease

## 2013-10-25 ENCOUNTER — Encounter (HOSPITAL_COMMUNITY): Payer: Self-pay | Admitting: Pharmacy Technician

## 2013-10-25 DIAGNOSIS — I480 Paroxysmal atrial fibrillation: Secondary | ICD-10-CM

## 2013-10-25 LAB — PROTIME-INR
INR: 1.59 — AB (ref <1.50)
Prothrombin Time: 19 seconds — ABNORMAL HIGH (ref 11.6–15.2)

## 2013-10-25 LAB — APTT: APTT: 34 s (ref 24–37)

## 2013-10-25 NOTE — Telephone Encounter (Signed)
Left message for sandra, explained she weould need to continue eliquis esp because she is scheduled for DCCV. She is to call with further questions.

## 2013-10-25 NOTE — Telephone Encounter (Signed)
[name] called in wanting to know should Kathleen [name] continue to take Eliquis? Please call  Thanks

## 2013-10-26 ENCOUNTER — Telehealth: Payer: Self-pay | Admitting: Cardiovascular Disease

## 2013-10-26 ENCOUNTER — Encounter: Payer: Self-pay | Admitting: Cardiovascular Disease

## 2013-10-26 MED ORDER — FUROSEMIDE 40 MG PO TABS: 40.0000 mg | ORAL_TABLET | Freq: Every day | ORAL | Status: DC

## 2013-10-26 MED ORDER — POTASSIUM CHLORIDE CRYS ER 20 MEQ PO TBCR: 20.0000 meq | EXTENDED_RELEASE_TABLET | Freq: Every day | ORAL | Status: DC

## 2013-10-26 NOTE — Telephone Encounter (Signed)
Pt is maintaining a lot of fluid, she can not lay down.Please call.

## 2013-10-26 NOTE — Telephone Encounter (Signed)
Per Dr. Royann Shivers, patient should take furosemide 40mg  once daily and potassium once daily. Rx was sent to pharmacy electronically. Notified patient's daughter.

## 2013-10-26 NOTE — Telephone Encounter (Signed)
Spoke with patient's daughter - patient's edema hs gotten progressively worse since yesterday. He ankles are more swollen, she has to sit up to feel like she can breathe well (cannot lie back and prop feet up). She has no appetite and feels weak.   She states that Dr. Royann Shivers had mentioned to them at OV on 10/12 about starting on diuretic and I imagine he wanted to see blood work results first (?)  Will route to Dr. Royann Shivers >> cardioversion on 10/28/13

## 2013-10-27 MED ORDER — SODIUM CHLORIDE 0.9 % IV SOLN: INTRAVENOUS | Status: DC

## 2013-10-28 ENCOUNTER — Ambulatory Visit (HOSPITAL_COMMUNITY)
Admission: RE | Admit: 2013-10-28 | Discharge: 2013-10-28 | Disposition: A | Discharge status: Home / Self Care | Payer: Medicare Other | Source: Ambulatory Visit | Attending: Cardiovascular Disease | Admitting: Cardiovascular Disease

## 2013-10-28 ENCOUNTER — Encounter (HOSPITAL_COMMUNITY)
Admission: RE | Disposition: A | Discharge status: Home / Self Care | Payer: Self-pay | Source: Ambulatory Visit | Attending: Cardiovascular Disease

## 2013-10-28 ENCOUNTER — Ambulatory Visit (HOSPITAL_COMMUNITY): Payer: Medicare Other | Admitting: Anesthesiology

## 2013-10-28 ENCOUNTER — Encounter (HOSPITAL_COMMUNITY): Payer: Self-pay | Admitting: Anesthesiology

## 2013-10-28 ENCOUNTER — Encounter (HOSPITAL_COMMUNITY): Payer: Medicare Other | Admitting: Anesthesiology

## 2013-10-28 DIAGNOSIS — K219 Gastro-esophageal reflux disease without esophagitis: Secondary | ICD-10-CM | POA: Insufficient documentation

## 2013-10-28 DIAGNOSIS — Z950 Presence of cardiac pacemaker: Secondary | ICD-10-CM | POA: Insufficient documentation

## 2013-10-28 DIAGNOSIS — I481 Persistent atrial fibrillation: Secondary | ICD-10-CM | POA: Diagnosis not present

## 2013-10-28 DIAGNOSIS — D649 Anemia, unspecified: Secondary | ICD-10-CM | POA: Insufficient documentation

## 2013-10-28 DIAGNOSIS — Z7901 Long term (current) use of anticoagulants: Secondary | ICD-10-CM | POA: Insufficient documentation

## 2013-10-28 DIAGNOSIS — N183 Chronic kidney disease, stage 3 (moderate): Secondary | ICD-10-CM | POA: Insufficient documentation

## 2013-10-28 DIAGNOSIS — J449 Chronic obstructive pulmonary disease, unspecified: Secondary | ICD-10-CM | POA: Diagnosis not present

## 2013-10-28 DIAGNOSIS — I129 Hypertensive chronic kidney disease with stage 1 through stage 4 chronic kidney disease, or unspecified chronic kidney disease: Secondary | ICD-10-CM | POA: Insufficient documentation

## 2013-10-28 DIAGNOSIS — I429 Cardiomyopathy, unspecified: Secondary | ICD-10-CM | POA: Diagnosis not present

## 2013-10-28 DIAGNOSIS — I509 Heart failure, unspecified: Secondary | ICD-10-CM | POA: Diagnosis not present

## 2013-10-28 DIAGNOSIS — E119 Type 2 diabetes mellitus without complications: Secondary | ICD-10-CM | POA: Insufficient documentation

## 2013-10-28 DIAGNOSIS — I480 Paroxysmal atrial fibrillation: Secondary | ICD-10-CM

## 2013-10-28 DIAGNOSIS — E785 Hyperlipidemia, unspecified: Secondary | ICD-10-CM | POA: Insufficient documentation

## 2013-10-28 HISTORY — PX: CARDIOVERSION: SHX1299

## 2013-10-28 LAB — GLUCOSE, CAPILLARY: GLUCOSE-CAPILLARY: 160 mg/dL — AB (ref 70–99)

## 2013-10-28 SURGERY — CARDIOVERSION
Anesthesia: Monitor Anesthesia Care

## 2013-10-28 MED ORDER — SODIUM CHLORIDE 0.9 % IV SOLN
INTRAVENOUS | Status: DC | PRN
  Administered 2013-10-28: 12:00:00 via INTRAVENOUS

## 2013-10-28 MED ORDER — PROPOFOL 10 MG/ML IV BOLUS
INTRAVENOUS | Status: DC | PRN
  Administered 2013-10-28: 60 mg via INTRAVENOUS

## 2013-10-28 MED ORDER — LIDOCAINE HCL (CARDIAC) 20 MG/ML IV SOLN
INTRAVENOUS | Status: DC | PRN
  Administered 2013-10-28: 30 mg via INTRAVENOUS

## 2013-10-28 NOTE — Interval H&P Note (Signed)
History and Physical Interval Note:  10/28/2013 8:43 AM  Kathleen [name]  has presented today for surgery, with the diagnosis of AFIB  The various methods of treatment have been discussed with the patient and family. After consideration of risks, benefits and other options for treatment, the patient has consented to  Procedure(s): CARDIOVERSION (N/A) as a surgical intervention .  The patient's history has been reviewed, patient examined, no change in status, stable for surgery.  I have reviewed the patient's chart and labs.  Questions were answered to the patient's satisfaction.     [name]

## 2013-10-28 NOTE — Anesthesia Preprocedure Evaluation (Addendum)
Anesthesia Evaluation  Patient identified by MRN, date of birth, ID band Patient awake    Airway Mallampati: II TM Distance: >3 FB Neck ROM: full    Dental  (+) Partial Upper, Dental Advidsory Given, Teeth Intact   Pulmonary shortness of breath, pneumonia -, COPD         Cardiovascular hypertension, + CAD + dysrhythmias Atrial Fibrillation + pacemaker     Neuro/Psych  Neuromuscular disease    GI/Hepatic GERD-  Medicated and Poorly Controlled,  Endo/Other  diabetes, Type 2, Oral Hypoglycemic Agents  Renal/GU Renal disease     Musculoskeletal   Abdominal   Peds  Hematology  (+) anemia ,   Anesthesia Other Findings Chronic renal insufficiency  Reproductive/Obstetrics                          Anesthesia Physical Anesthesia Plan  ASA: III  Anesthesia Plan: General   Post-op Pain Management:    Induction: Intravenous  Airway Management Planned: Mask  Additional Equipment:   Intra-op Plan:   Post-operative Plan:   Informed Consent: I have reviewed the patients History and Physical, chart, labs and discussed the procedure including the risks, benefits and alternatives for the proposed anesthesia with the patient or authorized representative who has indicated his/her understanding and acceptance.   Dental advisory given  Plan Discussed with: Anesthesiologist, CRNA and Surgeon  Anesthesia Plan Comments:        Anesthesia Quick Evaluation

## 2013-10-28 NOTE — Discharge Instructions (Signed)

## 2013-10-28 NOTE — Anesthesia Postprocedure Evaluation (Signed)
  Anesthesia Post-op Note  Patient: Kathleen [name]  Procedure(s) Performed: Procedure(s): CARDIOVERSION (N/A)  Patient Location: Endoscopy Unit  Anesthesia Type:MAC  Level of Consciousness: awake, alert  and oriented  Airway and Oxygen Therapy: Patient Spontanous Breathing and Patient connected to nasal cannula oxygen  Post-op Pain: none  Post-op Assessment: Post-op Vital signs reviewed, Patient's Cardiovascular Status Stable, Respiratory Function Stable, Patent Airway, No signs of Nausea or vomiting and Adequate PO intake  Post-op Vital Signs: Reviewed and stable  Last Vitals:  Filed Vitals:   10/28/13 1303  BP: 136/79  Pulse: 99  Resp: 20    Complications: No apparent anesthesia complications

## 2013-10-28 NOTE — H&P (View-Only) (Signed)
Patient ID: Kathleen [name], female   DOB: [birth year], 80 y.o.   MRN: [id number]     Reason for office visit Atrial fibrillation, CHF, CRT-P  Mrs. Revolorio was discharged from the hospital on 10/08, after being admitted for atrial fibrillation with RVR and symptoms and signs of acute exacerbation of CHF. She was previously admitted for a similar scenario in September. Amiodarone was started during that first admission. Ef remains good at 55-60%.  She was started on anticoagulation with Eliquis  2 weeks ago. In the past she had problems with bleeding (hemarthrosis) while on warfarin and anticoagulation was eventually stopped due to falls. Her CHADSVasc score is 6, but she has not had embolic events. She does not really want to stay on anticoagulants long term.  Rate control is now fair. Despite better rate control, she remains quite symptomatic, dyspneic and tired. She has also developed ankle swelling. NYHA class III.  Device interrogated today. Atrial fibrillation has persisted since early September. There has been a drastic reduction in CRT pacing. Even after optimal rate control, there is only 64% BiV pacing. Starting October 1, her Optivol index suggests increased filling pressures. Activity levels have decreased markedly.  She has a nonischemic cardiomyopathy that became very apparent following implantation of a dual-chamber permanent pacemaker with pacing induced ventricular asynchrony secondary to high grade atrioventricular block. Her ejection fraction dropped as low as 35%. She had a marked clinical response to initiation of biventricular pacing. She does not have significant CAD by angio 2013.  After pacemaker implantation there has been no detected atrial fibrillation, although occasional paroxysmal atrial tachycardia has been seen.  She has mild chronic kidney disease, stage III with baseline creatinine around 1.5. She has treated DM type 2, hypertension and  hyperlipidemia.   Allergies  Allergen Reactions  . Coumadin [Warfarin Sodium] Other (See Comments)    Caused Patient to Bleed Out.   . Meloxicam Other (See Comments)    Unknown  . Metformin And Related Other (See Comments)    Cannot have due to kidney function  . Morphine Itching    Not in right state of mind.   . Nabumetone Nausea And Vomiting  . Sulfonamide Derivatives Hives    Current Outpatient Prescriptions  Medication Sig Dispense Refill  . amiodarone (PACERONE) 200 MG tablet Take 1 tablet (200 mg total) by mouth 2 (two) times daily.  60 tablet  1  . apixaban (ELIQUIS) 2.5 MG TABS tablet Take 1 tablet (2.5 mg total) by mouth 2 (two) times daily.  60 tablet  6  . cholecalciferol (VITAMIN D) 1000 UNITS tablet Take 1,000 Units by mouth daily.      Marland Kitchen. diltiazem (CARDIZEM CD) 240 MG 24 hr capsule Take 1 capsule (240 mg total) by mouth daily.  30 capsule  1  . docusate sodium (COLACE) 100 MG capsule Take 100 mg by mouth 2 (two) times daily as needed for mild constipation.       Marland Kitchen. glimepiride (AMARYL) 1 MG tablet Take 0.5 tablets (0.5 mg total) by mouth daily with breakfast.  30 tablet  1  . metoprolol (LOPRESSOR) 50 MG tablet Take 1 tablet (50 mg total) by mouth 2 (two) times daily.  30 tablet  1  . Multiple Vitamins-Minerals (CENTRUM SILVER PO) Take 1 tablet by mouth daily.      Marland Kitchen. omeprazole (PRILOSEC) 20 MG capsule Take 1 capsule (20 mg total) by mouth daily.  30 capsule  6  . ondansetron (ZOFRAN) 4 MG tablet Take  4 mg by mouth every 4 (four) hours as needed for nausea or vomiting.      . simvastatin (ZOCOR) 40 MG tablet Take 1 tablet (40 mg total) by mouth at bedtime.  30 tablet  7  . furosemide (LASIX) 40 MG tablet Take 1 tablet (40 mg total) by mouth daily.  30 tablet  1  . potassium chloride SA (K-DUR,KLOR-CON) 20 MEQ tablet Take 1 tablet (20 mEq total) by mouth daily.  30 tablet  1  . [DISCONTINUED] benazepril (LOTENSIN) 40 MG tablet Take 40 mg by mouth 2 (two) times daily.        No current facility-administered medications for this visit.    Past Medical History  Diagnosis Date  . Hypertension   . Diabetes mellitus   . High cholesterol   . Atrial fibrillation   . Paroxysmal a-fib 12/13/2010  . PNA (pneumonia)   . CAD (coronary artery disease) 2006  . Acid reflux   . AV block, 2nd degree     S/p Pacemaker 03/2011  . CHF (congestive heart failure)   . Shortness of breath   . Pacemaker 10/16/2011    medtronic  . Nonischemic cardiomyopathy   . Chronic kidney disease (CKD), stage III (moderate)     Past Surgical History  Procedure Laterality Date  . Replacement total knee    . Bladder tact  1970's  . Pacemaker insertion  10/16/2011    Medtronic  . Cholecystectomy  1999  . Bladder tack    . Nm myocar perf wall motion  03/15/2009    Normal  . Cardiac catheterization  10/15/2011    Normal coronaries    Family History  Problem Relation Age of Onset  . Heart murmur Daughter   . Suicidality Mother   . Heart attack Brother     History   Social History  . Marital Status: Widowed    Spouse Name: N/A    Number of Children: N/A  . Years of Education: N/A   Occupational History  . Not on file.   Social History Main Topics  . Smoking status: Never Smoker   . Smokeless tobacco: Never Used  . Alcohol Use: No  . Drug Use: No  . Sexual Activity: Not Currently   Other Topics Concern  . Not on file   Social History Narrative  . No narrative on file    Review of systems: The patient specifically denies any chest pain at rest or with exertion, dyspnea at rest, orthopnea, paroxysmal nocturnal dyspnea, syncope, palpitations, focal neurological deficits, intermittent claudication, lower extremity edema, unexplained weight gain, cough, hemoptysis or wheezing.  The patient also denies abdominal pain, nausea, vomiting, dysphagia, diarrhea, constipation, polyuria, polydipsia, dysuria, hematuria, frequency, urgency, abnormal bleeding or bruising, fever,  chills, unexpected weight changes, mood swings, change in skin or hair texture, change in voice quality, auditory or visual problems, allergic reactions or rashes, new musculoskeletal complaints other than usual "aches and pains".    PHYSICAL EXAM BP 114/74  Pulse 87  Resp 20  Ht 5\' 8"  (1.727 m)  Wt 73.755 kg (162 lb 9.6 oz)  BMI 24.73 kg/m2 General: Alert, oriented x3, no distress  Head: no evidence of trauma, PERRL, EOMI, no exophtalmos or lid lag, no myxedema, no xanthelasma; normal ears, nose and oropharynx  Neck: normal jugular venous pulsations and no hepatojugular reflux; brisk carotid pulses without delay and no carotid bruits  Chest: clear to auscultation, no signs of consolidation by percussion or palpation, normal fremitus, symmetrical  and full respiratory excursions; healthy left subclavian pacemaker site  Cardiovascular: normal position and quality of the apical impulse, regular rhythm, normal first and paradoxically split second heart sounds, no murmurs, rubs or gallops  Abdomen: no tenderness or distention, no masses by palpation, no abnormal pulsatility or arterial bruits, normal bowel sounds, no hepatosplenomegaly  Extremities: no clubbing, cyanosis or edema; 2+ radial, ulnar and brachial pulses bilaterally; 2+ right femoral, posterior tibial and dorsalis pedis pulses; 2+ left femoral, posterior tibial and dorsalis pedis pulses; no subclavian or femoral bruits  Neurological: grossly nonfocal   EKG: Atrial sensed, 50% biventricular paced with a right bundle branch block QRS morpholog  Lipid Panel     Component Value Date/Time   CHOL 149 10/04/2013 0843   TRIG 110 10/04/2013 0843   HDL 79 10/04/2013 0843   CHOLHDL 1.9 10/04/2013 0843   VLDL 22 10/04/2013 0843   LDLCALC 48 10/04/2013 0843    BMET    Component Value Date/Time   NA 132* 10/24/2013 1628   K 3.9 10/24/2013 1628   CL 95* 10/24/2013 1628   CO2 25 10/24/2013 1628   GLUCOSE 193* 10/24/2013 1628   BUN 12  10/24/2013 1628   CREATININE 1.37* 10/24/2013 1628   CREATININE 1.18* 10/20/2013 0510   CALCIUM 9.2 10/24/2013 1628   GFRNONAA 41* 10/20/2013 0510   GFRAA 47* 10/20/2013 0510     ASSESSMENT AND PLAN  Mrs. Granville has acute CHF exacerbation due to persistent atrial fibrillation and loss of CRT, despite adequate rate control. She is tolerating anticoagulation so far, but has had bleeding complications in the past and may not be a good candidate for long term anticoagulation. She has now been on anticoagulation for over two weeks, soon will be ready for cardioversion. We discussed this procedure in detail. I think it gives her the best chance for symptom relief and return of quality of life. She agrees to proceed.  Orders Placed This Encounter  Procedures  . Cardioversion (Bedside)  . APTT  . Basic metabolic panel  . Protime-INR  . CBC  . EKG 12-Lead   Meds ordered this encounter  Medications  . ondansetron (ZOFRAN) 4 MG tablet    Sig: Take 4 mg by mouth every 4 (four) hours as needed for nausea or vomiting.    Junious Silk, MD, [facility] [phone] office [phone] pager

## 2013-10-28 NOTE — Op Note (Signed)
Procedure: Electrical Cardioversion Indications:  Atrial Fibrillation  Procedure Details:  Consent: Risks of procedure as well as the alternatives and risks of each were explained to the (patient/caregiver).  Consent for procedure obtained.  Time Out: Verified patient identification, verified procedure, site/side was marked, verified correct patient position, special equipment/implants available, medications/allergies/relevent history reviewed, required imaging and test results available.  Performed  Patient placed on cardiac monitor, pulse oximetry, supplemental oxygen as necessary.  Sedation given: propofol IV, Anesthesiology Pacer pads placed anterior and posterior chest.  Cardioverted 1 time(s).  Cardioverted at 120J sync biphasic  Evaluation: Findings: Post procedure EKG shows: Atrial Fibrillation Complications: None Patient did tolerate procedure well.  Time Spent Directly with the Patient:  45 minutes   Han Lysne 10/28/2013, 1:27 PM

## 2013-10-28 NOTE — Transfer of Care (Signed)
Immediate Anesthesia Transfer of Care Note  Patient: Kathleen [name]  Procedure(s) Performed: Procedure(s): CARDIOVERSION (N/A)  Patient Location: Endoscopy Unit  Anesthesia Type:MAC  Level of Consciousness: awake, alert  and oriented  Airway & Oxygen Therapy: Patient Spontanous Breathing and Patient connected to nasal cannula oxygen  Post-op Assessment: Report given to PACU RN, Post -op Vital signs reviewed and stable and Patient moving all extremities X 4  Post vital signs: Reviewed and stable  Complications: No apparent anesthesia complications

## 2013-11-01 ENCOUNTER — Encounter (HOSPITAL_COMMUNITY): Payer: Self-pay | Admitting: Cardiovascular Disease

## 2013-11-04 ENCOUNTER — Ambulatory Visit: Payer: Medicare Other | Admitting: *Deleted

## 2013-11-04 ENCOUNTER — Encounter: Payer: Self-pay | Admitting: Cardiovascular Disease

## 2013-11-04 DIAGNOSIS — I420 Dilated cardiomyopathy: Secondary | ICD-10-CM

## 2013-11-04 NOTE — Progress Notes (Signed)
Remote pacemaker transmission.   

## 2013-11-08 ENCOUNTER — Encounter: Payer: Medicare Other | Admitting: Cardiovascular Disease

## 2013-11-08 LAB — MDC_IDC_ENUM_SESS_TYPE_REMOTE
Battery Remaining Longevity: 49 mo
Battery Voltage: 3.01 V
Brady Statistic AP VP Percent: 92.95 %
Brady Statistic AS VP Percent: 7 %
Brady Statistic AS VS Percent: 0.03 %
Brady Statistic RV Percent Paced: 99.95 %
Lead Channel Impedance Value: 323 Ohm
Lead Channel Impedance Value: 437 Ohm
Lead Channel Impedance Value: 456 Ohm
Lead Channel Impedance Value: 494 Ohm
Lead Channel Impedance Value: 551 Ohm
Lead Channel Pacing Threshold Amplitude: 1 V
Lead Channel Pacing Threshold Amplitude: 1.125 V
Lead Channel Pacing Threshold Pulse Width: 0.4 ms
Lead Channel Pacing Threshold Pulse Width: 0.4 ms
Lead Channel Sensing Intrinsic Amplitude: 2.125 mV
Lead Channel Sensing Intrinsic Amplitude: 26.125 mV
Lead Channel Setting Pacing Amplitude: 2 V
Lead Channel Setting Pacing Pulse Width: 0.4 ms
Lead Channel Setting Sensing Sensitivity: 4 mV
MDC IDC MSMT LEADCHNL LV IMPEDANCE VALUE: 456 Ohm
MDC IDC MSMT LEADCHNL LV IMPEDANCE VALUE: 627 Ohm
MDC IDC MSMT LEADCHNL LV IMPEDANCE VALUE: 627 Ohm
MDC IDC MSMT LEADCHNL LV PACING THRESHOLD PULSEWIDTH: 0.4 ms
MDC IDC MSMT LEADCHNL RA IMPEDANCE VALUE: 380 Ohm
MDC IDC MSMT LEADCHNL RA PACING THRESHOLD AMPLITUDE: 0.75 V
MDC IDC MSMT LEADCHNL RA SENSING INTR AMPL: 2.125 mV
MDC IDC MSMT LEADCHNL RV SENSING INTR AMPL: 26.125 mV
MDC IDC SESS DTM: 20151023182645
MDC IDC SET LEADCHNL LV PACING AMPLITUDE: 2.25 V
MDC IDC SET LEADCHNL LV PACING PULSEWIDTH: 0.4 ms
MDC IDC SET LEADCHNL RV PACING AMPLITUDE: 2.25 V
MDC IDC SET ZONE DETECTION INTERVAL: 400 ms
MDC IDC STAT BRADY AP VS PERCENT: 0.02 %
MDC IDC STAT BRADY RA PERCENT PACED: 92.97 %
Zone Setting Detection Interval: 400 ms

## 2013-11-08 NOTE — Progress Notes (Signed)
Charges deleted b/c remote was post DCCV to check for AT/AF episodes only.

## 2013-11-09 NOTE — Progress Notes (Signed)
CRT-P device check in clinic (industry checked). Normal device function. Thresholds, sensing, impedance consistent with previous measurements. Histograms appropriate for patient and level of activity. Persistant AF since 9/22 + Eliquis. No ventricular high rate episodes. Patient bi-ventricularly pacing 64.1% of the time. Device programmed with appropriate safety margins. Device heart failure diagnostics have been unstable since AF. Estimated longevity 5 years. Patient will follow up with a DCCV first available.

## 2013-11-16 ENCOUNTER — Encounter: Payer: Self-pay | Admitting: Cardiology

## 2013-12-02 ENCOUNTER — Encounter: Payer: Self-pay | Admitting: Cardiology

## 2013-12-02 ENCOUNTER — Telehealth: Payer: Self-pay | Admitting: Cardiovascular Disease

## 2013-12-02 NOTE — Telephone Encounter (Signed)
RN answered phone - no one there. RN called phone number available - left message to return the call - ask to speak to triage.

## 2013-12-03 ENCOUNTER — Other Ambulatory Visit: Payer: Self-pay | Admitting: Cardiovascular Disease

## 2013-12-05 ENCOUNTER — Other Ambulatory Visit: Payer: Self-pay

## 2013-12-05 NOTE — Telephone Encounter (Signed)
Rx was sent to pharmacy electronically. 

## 2013-12-07 ENCOUNTER — Other Ambulatory Visit: Payer: Self-pay | Admitting: Internal Medicine

## 2013-12-07 NOTE — Telephone Encounter (Signed)
Rx refill sent to patient pharmacy   

## 2013-12-19 ENCOUNTER — Ambulatory Visit (INDEPENDENT_AMBULATORY_CARE_PROVIDER_SITE_OTHER): Payer: Medicare Other | Admitting: Cardiovascular Disease

## 2013-12-19 ENCOUNTER — Encounter: Payer: Self-pay | Admitting: Cardiovascular Disease

## 2013-12-19 VITALS — BP 136/66 | HR 70 | Resp 16 | Ht 68.0 in | Wt 152.7 lb

## 2013-12-19 DIAGNOSIS — I429 Cardiomyopathy, unspecified: Secondary | ICD-10-CM

## 2013-12-19 DIAGNOSIS — R5383 Other fatigue: Secondary | ICD-10-CM

## 2013-12-19 DIAGNOSIS — I420 Dilated cardiomyopathy: Secondary | ICD-10-CM

## 2013-12-19 DIAGNOSIS — I4891 Unspecified atrial fibrillation: Secondary | ICD-10-CM

## 2013-12-19 DIAGNOSIS — Z79899 Other long term (current) drug therapy: Secondary | ICD-10-CM

## 2013-12-19 DIAGNOSIS — I441 Atrioventricular block, second degree: Secondary | ICD-10-CM

## 2013-12-19 DIAGNOSIS — I480 Paroxysmal atrial fibrillation: Secondary | ICD-10-CM

## 2013-12-19 DIAGNOSIS — I5021 Acute systolic (congestive) heart failure: Secondary | ICD-10-CM

## 2013-12-19 DIAGNOSIS — E785 Hyperlipidemia, unspecified: Secondary | ICD-10-CM

## 2013-12-19 DIAGNOSIS — I5042 Chronic combined systolic (congestive) and diastolic (congestive) heart failure: Secondary | ICD-10-CM

## 2013-12-19 LAB — MDC_IDC_ENUM_SESS_TYPE_INCLINIC
Battery Remaining Longevity: 5
Battery Voltage: 3.01 V
Brady Statistic AP VP Percent: 96.9 %
Lead Channel Impedance Value: 418 Ohm
Lead Channel Impedance Value: 646 Ohm
Lead Channel Pacing Threshold Amplitude: 1.25 V
Lead Channel Pacing Threshold Pulse Width: 0.4 ms
Lead Channel Sensing Intrinsic Amplitude: 2 mV
Lead Channel Setting Pacing Amplitude: 2 V
Lead Channel Setting Pacing Amplitude: 2 V
Lead Channel Setting Pacing Amplitude: 2.25 V
Lead Channel Setting Pacing Pulse Width: 0.4 ms
Lead Channel Setting Pacing Pulse Width: 0.4 ms
MDC IDC MSMT LEADCHNL RA PACING THRESHOLD AMPLITUDE: 0.75 V
MDC IDC MSMT LEADCHNL RA PACING THRESHOLD PULSEWIDTH: 0.4 ms
MDC IDC MSMT LEADCHNL RV IMPEDANCE VALUE: 532 Ohm
MDC IDC MSMT LEADCHNL RV PACING THRESHOLD AMPLITUDE: 1 V
MDC IDC MSMT LEADCHNL RV PACING THRESHOLD PULSEWIDTH: 0.4 ms
MDC IDC MSMT LEADCHNL RV SENSING INTR AMPL: 20 mV
MDC IDC SET LEADCHNL RV SENSING SENSITIVITY: 4 mV
MDC IDC SET ZONE DETECTION INTERVAL: 400 ms
MDC IDC STAT BRADY AP VS PERCENT: 0.1 % — AB
MDC IDC STAT BRADY AS VP PERCENT: 3.1 %
MDC IDC STAT BRADY AS VS PERCENT: 0.1 % — AB
Zone Setting Detection Interval: 400 ms

## 2013-12-19 MED ORDER — AMIODARONE HCL 200 MG PO TABS: 200.0000 mg | ORAL_TABLET | Freq: Every day | ORAL | Status: DC

## 2013-12-19 MED ORDER — PRAVASTATIN SODIUM 40 MG PO TABS
40.0000 mg | ORAL_TABLET | Freq: Every evening | ORAL | Status: DC
Start: 2013-12-19 — End: 2014-07-19

## 2013-12-19 MED ORDER — APIXABAN 2.5 MG PO TABS: 2.5000 mg | ORAL_TABLET | Freq: Two times a day (BID) | ORAL | Status: DC

## 2013-12-19 MED ORDER — METOPROLOL TARTRATE 25 MG PO TABS: 25.0000 mg | ORAL_TABLET | Freq: Two times a day (BID) | ORAL | Status: DC

## 2013-12-19 MED ORDER — DILTIAZEM HCL ER COATED BEADS 240 MG PO CP24: 240.0000 mg | ORAL_CAPSULE | Freq: Every day | ORAL | Status: DC

## 2013-12-19 NOTE — Progress Notes (Signed)
Patient ID: Kathleen [name], female   DOB: 02-07-1924, 80 y.o.   MRN: [id number]     Reason for office visit Atrial fibrillation follow-up after cardioversion, congestive heart failure, CRT-P  Kathleen [name] returns in follow-up roughly 6 weeks following elective synchronized cardioversion for persistent atrial fibrillation that had led to a marked reduction in the biventricular pacing percentage and evidence of worsening hypervolemia/decompensated heart failure.  Following her cardioversion things have turned around nicely. She has maintained atrial paced/ventricular paced rhythm without interruption. Biventricular pacing has returned to 100%. The ventricular rates are substantially lower. Her activity level is increasing. Her thoracic impedance has returned to baseline.  She does complain of some fatigue. She has a very blunted heart rate histogram distribution, probably attributable to amiodarone.  Otherwise normal function of all her pacemaker leads and generator.  Note potential adverse interaction between amiodarone and simvastatin. She has not had any bleeding or neurological complications while receiving anticoagulation with Eliquis.   Allergies  Allergen Reactions  . Coumadin [Warfarin Sodium] Other (See Comments)    Caused Patient to Bleed Out.   . Meloxicam Other (See Comments)    Unknown  . Metformin And Related Other (See Comments)    Cannot have due to kidney function  . Morphine Itching    Not in right state of mind.   . Nabumetone Nausea And Vomiting  . Sulfonamide Derivatives Hives    Current Outpatient Prescriptions  Medication Sig Dispense Refill  . amiodarone (PACERONE) 200 MG tablet Take 1 tablet (200 mg total) by mouth daily. 30 tablet 6  . apixaban (ELIQUIS) 2.5 MG TABS tablet Take 1 tablet (2.5 mg total) by mouth 2 (two) times daily. 60 tablet 6  . cholecalciferol (VITAMIN D) 1000 UNITS tablet Take 1,000 Units by mouth daily.    Marland Kitchen diltiazem (CARDIZEM CD)  240 MG 24 hr capsule Take 1 capsule (240 mg total) by mouth daily. 30 capsule 9  . docusate sodium (COLACE) 100 MG capsule Take 100 mg by mouth 2 (two) times daily as needed for mild constipation.     . furosemide (LASIX) 40 MG tablet Take 1 tablet (40 mg total) by mouth daily. 30 tablet 1  . glimepiride (AMARYL) 1 MG tablet Take 0.5 tablets (0.5 mg total) by mouth daily with breakfast. 30 tablet 1  . meclizine (ANTIVERT) 25 MG tablet Take 25 mg by mouth as needed for dizziness.    . Multiple Vitamins-Minerals (CENTRUM SILVER PO) Take 1 tablet by mouth daily.    Marland Kitchen omeprazole (PRILOSEC) 20 MG capsule Take 1 capsule (20 mg total) by mouth daily. 30 capsule 6  . ondansetron (ZOFRAN) 4 MG tablet Take 4 mg by mouth every 4 (four) hours as needed for nausea or vomiting.    . potassium chloride SA (K-DUR,KLOR-CON) 20 MEQ tablet Take 1 tablet (20 mEq total) by mouth daily. 30 tablet 1  . metoprolol tartrate (LOPRESSOR) 25 MG tablet Take 1 tablet (25 mg total) by mouth 2 (two) times daily. 60 tablet 6  . pravastatin (PRAVACHOL) 40 MG tablet Take 1 tablet (40 mg total) by mouth every evening. 30 tablet 6  . [DISCONTINUED] benazepril (LOTENSIN) 40 MG tablet Take 40 mg by mouth 2 (two) times daily.     No current facility-administered medications for this visit.    Past Medical History  Diagnosis Date  . Hypertension   . Diabetes mellitus   . High cholesterol   . Atrial fibrillation   . Paroxysmal a-fib 12/13/2010  .  PNA (pneumonia)   . CAD (coronary artery disease) 2006  . Acid reflux   . AV block, 2nd degree     S/p Pacemaker 03/2011  . CHF (congestive heart failure)   . Shortness of breath   . Pacemaker 10/16/2011    medtronic  . Nonischemic cardiomyopathy   . Chronic kidney disease (CKD), stage III (moderate)     Past Surgical History  Procedure Laterality Date  . Replacement total knee    . Bladder tact  1970's  . Pacemaker insertion  10/16/2011    Medtronic  . Cholecystectomy  1999    . Bladder tack    . Nm myocar perf wall motion  03/15/2009    Normal  . Cardiac catheterization  10/15/2011    Normal coronaries  . Cardioversion N/A 10/28/2013    Procedure: CARDIOVERSION;  Surgeon: Sanda Klein, MD;  Location: [facility]OS[facility];  Service: Cardiovascular;  Laterality: N/A;    Family History  Problem Relation Age of Onset  . Heart murmur Daughter   . Suicidality Mother   . Heart attack Brother     History   Social History  . Marital Status: Widowed    Spouse Name: N/A    Number of Children: N/A  . Years of Education: N/A   Occupational History  . Not on file.   Social History Main Topics  . Smoking status: Never Smoker   . Smokeless tobacco: Never Used  . Alcohol Use: No  . Drug Use: No  . Sexual Activity: Not Currently   Other Topics Concern  . Not on file   Social History Narrative    Review of systems: The patient specifically denies any chest pain at rest or with exertion, dyspnea at rest, orthopnea, paroxysmal nocturnal dyspnea, syncope, palpitations, focal neurological deficits, intermittent claudication, lower extremity edema, unexplained weight gain, cough, hemoptysis or wheezing.  The patient also denies abdominal pain, nausea, vomiting, dysphagia, diarrhea, constipation, polyuria, polydipsia, dysuria, hematuria, frequency, urgency, abnormal bleeding or bruising, fever, chills, unexpected weight changes, mood swings, change in skin or hair texture, change in voice quality, auditory or visual problems, allergic reactions or rashes, new musculoskeletal complaints other than usual "aches and pains".    PHYSICAL EXAM BP 136/66 mmHg  Pulse 70  Resp 16  Ht _0  (1.727 m)  Wt 152 lb 11.2 oz (69.264 kg)  BMI 23.22 kg/m2 General: Alert, oriented x3, no distress  Head: no evidence of trauma, PERRL, EOMI, no exophtalmos or lid lag, no myxedema, no xanthelasma; normal ears, nose and oropharynx  Neck: normal jugular venous pulsations and no  hepatojugular reflux; brisk carotid pulses without delay and no carotid bruits  Chest: clear to auscultation, no signs of consolidation by percussion or palpation, normal fremitus, symmetrical and full respiratory excursions; healthy left subclavian pacemaker site  Cardiovascular: normal position and quality of the apical impulse, regular rhythm, normal first and paradoxically split second heart sounds, no murmurs, rubs or gallops  Abdomen: no tenderness or distention, no masses by palpation, no abnormal pulsatility or arterial bruits, normal bowel sounds, no hepatosplenomegaly  Extremities: no clubbing, cyanosis or edema; 2+ radial, ulnar and brachial pulses bilaterally; 2+ right femoral, posterior tibial and dorsalis pedis pulses; 2+ left femoral, posterior tibial and dorsalis pedis pulses; no subclavian or femoral bruits  Neurological: grossly nonfocal   EKG: 100% AV paced. Paced QRS complex has right bundle branch block morphology lead V1 consistent with bi-V pacing  Lipid Panel     Component Value Date/Time  CHOL 149 10/04/2013 0843   TRIG 110 10/04/2013 0843   HDL 79 10/04/2013 0843   CHOLHDL 1.9 10/04/2013 0843   VLDL 22 10/04/2013 0843   LDLCALC 48 10/04/2013 0843    BMET    Component Value Date/Time   NA 132* 10/24/2013 1628   K 3.9 10/24/2013 1628   CL 95* 10/24/2013 1628   CO2 25 10/24/2013 1628   GLUCOSE 193* 10/24/2013 1628   BUN 12 10/24/2013 1628   CREATININE 1.37* 10/24/2013 1628   CREATININE 1.18* 10/20/2013 0510   CALCIUM 9.2 10/24/2013 1628   GFRNONAA 41* 10/20/2013 0510   GFRAA 47* 10/20/2013 0510     ASSESSMENT AND PLAN  Resolved acute exacerbation of heart failure following resolution of atrial fibrillation and the resumption of effective biventricular pacing. She appears clinically euvolemic, NYHA functional class I-II.  Successful maintenance of normal rhythm on amiodarone therapy, at the price of reduced heart rate response to activity. Rate  response sensor is turned on today. Reduce amiodarone to 200 mg once daily. Reduce metoprolol to 25 mg twice a day. Check liver function tests and thyroid function tests. Anticipate gradual changes in warfarin requirement as the amiodarone interaction evolves.  Discontinue simvastatin due to interaction with amiodarone. Replace with pravastatin 40 mg at bedtime daily and recheck lipids in 3 months.   Orders Placed This Encounter  Procedures  . Comp Met (CMET)  . TSH  . Lipid Profile  . EKG 12-Lead   Meds ordered this encounter  Medications  . meclizine (ANTIVERT) 25 MG tablet    Sig: Take 25 mg by mouth as needed for dizziness.  Marland Kitchen DISCONTD: diltiazem (CARDIZEM CD) 240 MG 24 hr capsule    Sig: Take 1 capsule (240 mg total) by mouth daily.    Dispense:  30 capsule    Refill:  9  . pravastatin (PRAVACHOL) 40 MG tablet    Sig: Take 1 tablet (40 mg total) by mouth every evening.    Dispense:  30 tablet    Refill:  6  . DISCONTD: amiodarone (PACERONE) 200 MG tablet    Sig: Take 1 tablet (200 mg total) by mouth daily.    Dispense:  30 tablet    Refill:  6  . metoprolol tartrate (LOPRESSOR) 25 MG tablet    Sig: Take 1 tablet (25 mg total) by mouth 2 (two) times daily.    Dispense:  60 tablet    Refill:  6  . apixaban (ELIQUIS) 2.5 MG TABS tablet    Sig: Take 1 tablet (2.5 mg total) by mouth 2 (two) times daily.    Dispense:  60 tablet    Refill:  6  . diltiazem (CARDIZEM CD) 240 MG 24 hr capsule    Sig: Take 1 capsule (240 mg total) by mouth daily.    Dispense:  30 capsule    Refill:  9  . amiodarone (PACERONE) 200 MG tablet    Sig: Take 1 tablet (200 mg total) by mouth daily.    Dispense:  30 tablet    Refill:  [place] Massie Cogliano, MD, [facility] [phone] office [phone] pager

## 2013-12-19 NOTE — Patient Instructions (Signed)
Your physician wants you to follow-up in: 3 Months with Pacer check You will receive a reminder letter in the mail two months in advance. If you don't receive a letter, please call our office to schedule the follow-up appointment.  Your physician has recommended you make the following change in your medication: Decrease Amiodarone 200 mg daily and Metoprolol 25 mg (1/2) tablets twice a day, STOP simvastatin and START Pravastatin 40 mg at bedtime  Your physician recommends that you return for lab work in: 3 Months CMP, TSH, and Fasting lipids profile

## 2013-12-22 ENCOUNTER — Encounter (HOSPITAL_COMMUNITY): Payer: Self-pay | Admitting: Cardiovascular Disease

## 2014-01-19 ENCOUNTER — Encounter: Payer: Self-pay | Admitting: Cardiovascular Disease

## 2014-01-21 DIAGNOSIS — I5040 Unspecified combined systolic (congestive) and diastolic (congestive) heart failure: Secondary | ICD-10-CM | POA: Diagnosis not present

## 2014-01-31 DIAGNOSIS — E119 Type 2 diabetes mellitus without complications: Secondary | ICD-10-CM | POA: Diagnosis not present

## 2014-01-31 DIAGNOSIS — H31012 Macula scars of posterior pole (postinflammatory) (post-traumatic), left eye: Secondary | ICD-10-CM | POA: Diagnosis not present

## 2014-01-31 DIAGNOSIS — Z961 Presence of intraocular lens: Secondary | ICD-10-CM | POA: Diagnosis not present

## 2014-02-01 DIAGNOSIS — E119 Type 2 diabetes mellitus without complications: Secondary | ICD-10-CM | POA: Diagnosis not present

## 2014-02-21 DIAGNOSIS — I5040 Unspecified combined systolic (congestive) and diastolic (congestive) heart failure: Secondary | ICD-10-CM | POA: Diagnosis not present

## 2014-02-23 DIAGNOSIS — E1129 Type 2 diabetes mellitus with other diabetic kidney complication: Secondary | ICD-10-CM | POA: Diagnosis not present

## 2014-02-23 DIAGNOSIS — E782 Mixed hyperlipidemia: Secondary | ICD-10-CM | POA: Diagnosis not present

## 2014-02-23 DIAGNOSIS — Z6823 Body mass index (BMI) 23.0-23.9, adult: Secondary | ICD-10-CM | POA: Diagnosis not present

## 2014-02-23 DIAGNOSIS — J449 Chronic obstructive pulmonary disease, unspecified: Secondary | ICD-10-CM | POA: Diagnosis not present

## 2014-03-07 ENCOUNTER — Telehealth: Payer: Self-pay | Admitting: Cardiovascular Disease

## 2014-03-07 ENCOUNTER — Encounter: Payer: Self-pay | Admitting: Cardiovascular Disease

## 2014-03-07 NOTE — Telephone Encounter (Signed)
Closed encounter °

## 2014-03-14 ENCOUNTER — Telehealth: Payer: Self-pay | Admitting: Cardiovascular Disease

## 2014-03-14 NOTE — Telephone Encounter (Signed)
Patient's pacer check appointment has been rescheduled from 03/20/14 to 04/12/14.  Does she need to send a transmission prior to 04/12/14?

## 2014-03-14 NOTE — Telephone Encounter (Signed)
Prefer to get a download every 3 months, so she can send one any time prior to the appt.  I tried to call back, invalid phone number on file.

## 2014-03-16 NOTE — Telephone Encounter (Signed)
Forward to device clinic. 

## 2014-03-16 NOTE — Telephone Encounter (Signed)
Number invalid / has been changed.

## 2014-03-20 ENCOUNTER — Encounter: Payer: Medicare Other | Admitting: Cardiovascular Disease

## 2014-03-22 DIAGNOSIS — I5040 Unspecified combined systolic (congestive) and diastolic (congestive) heart failure: Secondary | ICD-10-CM | POA: Diagnosis not present

## 2014-04-11 DIAGNOSIS — E785 Hyperlipidemia, unspecified: Secondary | ICD-10-CM | POA: Diagnosis not present

## 2014-04-11 DIAGNOSIS — R5383 Other fatigue: Secondary | ICD-10-CM | POA: Diagnosis not present

## 2014-04-11 DIAGNOSIS — Z79899 Other long term (current) drug therapy: Secondary | ICD-10-CM | POA: Diagnosis not present

## 2014-04-12 ENCOUNTER — Ambulatory Visit (INDEPENDENT_AMBULATORY_CARE_PROVIDER_SITE_OTHER): Payer: Self-pay | Admitting: Cardiovascular Disease

## 2014-04-12 ENCOUNTER — Encounter: Payer: Self-pay | Admitting: Cardiovascular Disease

## 2014-04-12 VITALS — BP 134/60 | HR 78 | Ht 68.0 in | Wt 160.3 lb

## 2014-04-12 DIAGNOSIS — I5021 Acute systolic (congestive) heart failure: Secondary | ICD-10-CM | POA: Diagnosis not present

## 2014-04-12 DIAGNOSIS — I429 Cardiomyopathy, unspecified: Secondary | ICD-10-CM

## 2014-04-12 DIAGNOSIS — I2510 Atherosclerotic heart disease of native coronary artery without angina pectoris: Secondary | ICD-10-CM

## 2014-04-12 DIAGNOSIS — Z950 Presence of cardiac pacemaker: Secondary | ICD-10-CM

## 2014-04-12 DIAGNOSIS — Z79899 Other long term (current) drug therapy: Secondary | ICD-10-CM

## 2014-04-12 DIAGNOSIS — I10 Essential (primary) hypertension: Secondary | ICD-10-CM

## 2014-04-12 DIAGNOSIS — I480 Paroxysmal atrial fibrillation: Secondary | ICD-10-CM

## 2014-04-12 DIAGNOSIS — I4891 Unspecified atrial fibrillation: Secondary | ICD-10-CM

## 2014-04-12 DIAGNOSIS — I5042 Chronic combined systolic (congestive) and diastolic (congestive) heart failure: Secondary | ICD-10-CM

## 2014-04-12 DIAGNOSIS — E785 Hyperlipidemia, unspecified: Secondary | ICD-10-CM

## 2014-04-12 DIAGNOSIS — I441 Atrioventricular block, second degree: Secondary | ICD-10-CM

## 2014-04-12 DIAGNOSIS — I420 Dilated cardiomyopathy: Secondary | ICD-10-CM

## 2014-04-12 LAB — MDC_IDC_ENUM_SESS_TYPE_INCLINIC
Battery Voltage: 3.01 V
Brady Statistic AS VS Percent: 0.1 % — CL
Lead Channel Impedance Value: 513 Ohm
Lead Channel Impedance Value: 665 Ohm
Lead Channel Pacing Threshold Amplitude: 0.5 V
Lead Channel Pacing Threshold Amplitude: 1 V
Lead Channel Pacing Threshold Pulse Width: 0.4 ms
Lead Channel Pacing Threshold Pulse Width: 0.4 ms
Lead Channel Sensing Intrinsic Amplitude: 20 mV
Lead Channel Setting Pacing Amplitude: 2 V
Lead Channel Setting Pacing Amplitude: 2.25 V
Lead Channel Setting Pacing Pulse Width: 0.4 ms
Lead Channel Setting Pacing Pulse Width: 0.4 ms
Lead Channel Setting Sensing Sensitivity: 4 mV
MDC IDC MSMT LEADCHNL RA IMPEDANCE VALUE: 437 Ohm
MDC IDC MSMT LEADCHNL RA SENSING INTR AMPL: 1.8 mV
MDC IDC MSMT LEADCHNL RV PACING THRESHOLD AMPLITUDE: 1.25 V
MDC IDC MSMT LEADCHNL RV PACING THRESHOLD PULSEWIDTH: 0.4 ms
MDC IDC SET LEADCHNL RV PACING AMPLITUDE: 2.5 V
MDC IDC SET ZONE DETECTION INTERVAL: 400 ms
MDC IDC STAT BRADY AP VP PERCENT: 99.6 %
MDC IDC STAT BRADY AP VS PERCENT: 0.1 %
MDC IDC STAT BRADY AS VP PERCENT: 0.2 %
Zone Setting Detection Interval: 400 ms

## 2014-04-12 LAB — COMPREHENSIVE METABOLIC PANEL
ALT: 17 U/L (ref 0–35)
AST: 23 U/L (ref 0–37)
Albumin: 4.1 g/dL (ref 3.5–5.2)
Alkaline Phosphatase: 92 U/L (ref 39–117)
BUN: 19 mg/dL (ref 6–23)
CALCIUM: 9.4 mg/dL (ref 8.4–10.5)
CHLORIDE: 99 meq/L (ref 96–112)
CO2: 33 meq/L — AB (ref 19–32)
Creat: 1.35 mg/dL — ABNORMAL HIGH (ref 0.50–1.10)
Glucose, Bld: 145 mg/dL — ABNORMAL HIGH (ref 70–99)
POTASSIUM: 4.1 meq/L (ref 3.5–5.3)
Sodium: 139 mEq/L (ref 135–145)
TOTAL PROTEIN: 6.6 g/dL (ref 6.0–8.3)
Total Bilirubin: 0.7 mg/dL (ref 0.2–1.2)

## 2014-04-12 LAB — LIPID PANEL
CHOLESTEROL: 151 mg/dL (ref 0–200)
HDL: 60 mg/dL (ref >=46)
LDL CALC: 69 mg/dL (ref 0–99)
Total CHOL/HDL Ratio: 2.5 Ratio
Triglycerides: 108 mg/dL (ref <150)
VLDL: 22 mg/dL (ref 0–40)

## 2014-04-12 LAB — TSH: TSH: 4.222 u[IU]/mL (ref 0.350–4.500)

## 2014-04-12 NOTE — Progress Notes (Signed)
Patient ID: Kathleen [name], female   DOB: 05-11-1924, 81 y.o.   MRN: [id number]     Cardiology Office Note   Date:  04/14/2014   ID:  Kathleen [name], DOB [birth year], MRN [id number]  PCP:  Glo Herring., MD  Cardiologist:   Sanda Klein, MD   Chief Complaint  Patient presents with  . Follow-up    4 months:  No complaints of chest pain, edema or dizziness.  Occas. edema.      History of Present Illness: Kathleen [name] is a 81 y.o. female who presents for follow up of CHF, AV block s/p CRT-P and paroxysmal atrial fibrillation as well as minor CAD and risk factor management.  She feels well. She denies dyspnea and has infrequent edema. No syncope or palpitations. She has not had any bleeding or neurological complications while receiving anticoagulation with Eliquis. October 2015 she had CHF exacerbation due to protracted AFib and loss of CRT, improved after DCCV.  CRT-P ([employer], 2013) device check shows 4.5 year estimated battery longevity, 99.7% atrial paced, 99.8% ventricular paced. No atrial fibrillation since her cardioversion last year. Activity steady at 1.2 hours/day. Optivol out of range for most of the winter months, recently back to normal (not clearly correlated with clinical status).    Past Medical History  Diagnosis Date  . Hypertension   . Diabetes mellitus   . High cholesterol   . Atrial fibrillation   . Paroxysmal a-fib 12/13/2010  . PNA (pneumonia)   . CAD (coronary artery disease) 2006  . Acid reflux   . AV block, 2nd degree     S/p Pacemaker 03/2011  . CHF (congestive heart failure)   . Shortness of breath   . Pacemaker 10/16/2011    medtronic  . Nonischemic cardiomyopathy   . Chronic kidney disease (CKD), stage III (moderate)     Past Surgical History  Procedure Laterality Date  . Replacement total knee    . Bladder tact  1970's  . Pacemaker insertion  10/16/2011    Medtronic  . Cholecystectomy  1999  . Bladder tack    . Nm  myocar perf wall motion  03/15/2009    Normal  . Cardiac catheterization  10/15/2011    Normal coronaries  . Cardioversion N/A 10/28/2013    Procedure: CARDIOVERSION;  Surgeon: Sanda Klein, MD;  Location: [place];  Service: Cardiovascular;  Laterality: N/A;  . Permanent pacemaker insertion N/A 03/24/2011    Procedure: PERMANENT PACEMAKER INSERTION;  Surgeon: Sanda Klein, MD;  Location: [place] CATH LAB;  Service: Cardiovascular;  Laterality: N/A;  . Left and right heart catheterization with coronary angiogram N/A 10/15/2011    Procedure: LEFT AND RIGHT HEART CATHETERIZATION WITH CORONARY ANGIOGRAM;  Surgeon: Pixie Casino, MD;  Location: [facility] CATH LAB;  Service: Cardiovascular;  Laterality: N/A;     Current Outpatient Prescriptions  Medication Sig Dispense Refill  . amiodarone (PACERONE) 200 MG tablet Take 1 tablet (200 mg total) by mouth daily. 30 tablet 6  . apixaban (ELIQUIS) 2.5 MG TABS tablet Take 1 tablet (2.5 mg total) by mouth 2 (two) times daily. 60 tablet 6  . cholecalciferol (VITAMIN D) 1000 UNITS tablet Take 1,000 Units by mouth daily.    Marland Kitchen diltiazem (CARDIZEM CD) 240 MG 24 hr capsule Take 1 capsule (240 mg total) by mouth daily. 30 capsule 9  . docusate sodium (COLACE) 100 MG capsule Take 100 mg by mouth 2 (two) times daily as needed for mild constipation.     Marland Kitchen  furosemide (LASIX) 40 MG tablet Take 1 tablet (40 mg total) by mouth daily. 30 tablet 1  . glimepiride (AMARYL) 1 MG tablet Take 0.5 tablets (0.5 mg total) by mouth daily with breakfast. 30 tablet 1  . meclizine (ANTIVERT) 25 MG tablet Take 25 mg by mouth as needed for dizziness.    . metoprolol tartrate (LOPRESSOR) 25 MG tablet Take 1 tablet (25 mg total) by mouth 2 (two) times daily. 60 tablet 6  . Multiple Vitamins-Minerals (CENTRUM SILVER PO) Take 1 tablet by mouth daily.    Marland Kitchen omeprazole (PRILOSEC) 20 MG capsule Take 1 capsule (20 mg total) by mouth daily. 30 capsule 6  . ondansetron (ZOFRAN) 4 MG tablet Take 4 mg  by mouth every 4 (four) hours as needed for nausea or vomiting.    . potassium chloride SA (K-DUR,KLOR-CON) 20 MEQ tablet Take 1 tablet (20 mEq total) by mouth daily. 30 tablet 1  . pravastatin (PRAVACHOL) 40 MG tablet Take 1 tablet (40 mg total) by mouth every evening. 30 tablet 6  . [DISCONTINUED] benazepril (LOTENSIN) 40 MG tablet Take 40 mg by mouth 2 (two) times daily.     No current facility-administered medications for this visit.    Allergies:   Coumadin; Meloxicam; Metformin and related; Morphine; Nabumetone; and Sulfonamide derivatives    Social History:  The patient  reports that she has never smoked. She has never used smokeless tobacco. She reports that she does not drink alcohol or use illicit drugs.   Family History:  The patient's family history includes Heart attack in her brother; Heart murmur in her daughter; Suicidality in her mother.    ROS:  Please see the history of present illness.    Otherwise, review of systems positive for none.   All other systems are reviewed and negative.    PHYSICAL EXAM: VS:  BP 134/60 mmHg  Pulse 78  Ht $R'5\' 8"'PB$  (1.727 m)  Wt 160 lb 4.8 oz (72.712 kg)  BMI 24.38 kg/m2 , BMI Body mass index is 24.38 kg/(m^2).  General: Alert, oriented x3, no distress  Head: no evidence of trauma, PERRL, EOMI, no exophtalmos or lid lag, no myxedema, no xanthelasma; normal ears, nose and oropharynx  Neck: normal jugular venous pulsations and no hepatojugular reflux; brisk carotid pulses without delay and no carotid bruits  Chest: clear to auscultation, no signs of consolidation by percussion or palpation, normal fremitus, symmetrical and full respiratory excursions; healthy left subclavian pacemaker site  Cardiovascular: normal position and quality of the apical impulse, regular rhythm, normal first and paradoxically split second heart sounds, no murmurs, rubs or gallops  Abdomen: no tenderness or distention, no masses by palpation, no abnormal  pulsatility or arterial bruits, normal bowel sounds, no hepatosplenomegaly  Extremities: no clubbing, cyanosis or edema; 2+ radial, ulnar and brachial pulses bilaterally; 2+ right femoral, posterior tibial and dorsalis pedis pulses; 2+ left femoral, posterior tibial and dorsalis pedis pulses; no subclavian or femoral bruits  Neurological: grossly nonfocal  Psych: euthymic mood, full affect   EKG:  EKG is not ordered today.   Recent Labs: 10/16/2013: Pro B Natriuretic peptide (BNP) 3226.0* 10/18/2013: Magnesium 1.8 10/24/2013: Hemoglobin 11.7*; Platelets 295 04/11/2014: ALT 17; BUN 19; Creatinine 1.35*; Potassium 4.1; Sodium 139; TSH 4.222    Lipid Panel    Component Value Date/Time   CHOL 151 04/11/2014 0820   TRIG 108 04/11/2014 0820   HDL 60 04/11/2014 0820   CHOLHDL 2.5 04/11/2014 0820   VLDL 22 04/11/2014 0820  LDLCALC 69 04/11/2014 0820      Wt Readings from Last 3 Encounters:  04/12/14 160 lb 4.8 oz (72.712 kg)  12/19/13 152 lb 11.2 oz (69.264 kg)  10/24/13 162 lb 9.6 oz (73.755 kg)     ASSESSMENT AND PLAN:  1. Chronic combined systolic and diastolic HF, NYHA class I-II, euvolemic  2. Nonischemic cardiomyopathy with LBBB, CRT responder  3. Normal CRT-P device function  4. History of persistent AFib with CHF decompensation - maintaining normal rhythm on amiodarone and apixaban. Checked LFTs and TFTs today - in normal range, repeat in 6 months.  5. Excellent control of hyperlipidemia after switching to pravastatin   Current medicines are reviewed at length with the patient today.  The patient does not have concerns regarding medicines.  The following changes have been made:  no change  Labs/ tests ordered today include:  Orders Placed This Encounter  Procedures  . Comp Met (CMET)  . TSH    Patient Instructions  Dr.Savita Runner recommends that you schedule a follow-up appointment in:6 months + Device check  You should do a home remote check in 3  Months  (June)  Your physician recommends that you return for lab work a week or so before you return for your next appointment       Signed, Sanda Klein, MD  04/14/2014 7:06 PM    Sanda Klein, MD, [facility] [phone] office [phone] pager

## 2014-04-12 NOTE — Patient Instructions (Signed)
Dr.Croitoru recommends that you schedule a follow-up appointment in:6 months + Device check  You should do a home remote check in 3  Months (June)  Your physician recommends that you return for lab work a week or so before you return for your next appointment

## 2014-04-22 DIAGNOSIS — I5040 Unspecified combined systolic (congestive) and diastolic (congestive) heart failure: Secondary | ICD-10-CM | POA: Diagnosis not present

## 2014-04-25 ENCOUNTER — Other Ambulatory Visit: Payer: Self-pay | Admitting: Cardiovascular Disease

## 2014-04-25 NOTE — Telephone Encounter (Signed)
Rx has been sent to the pharmacy electronically. ° °

## 2014-04-27 DIAGNOSIS — I4891 Unspecified atrial fibrillation: Secondary | ICD-10-CM | POA: Diagnosis not present

## 2014-04-27 DIAGNOSIS — Z0000 Encounter for general adult medical examination without abnormal findings: Secondary | ICD-10-CM | POA: Diagnosis not present

## 2014-04-27 DIAGNOSIS — E782 Mixed hyperlipidemia: Secondary | ICD-10-CM | POA: Diagnosis not present

## 2014-04-27 DIAGNOSIS — I10 Essential (primary) hypertension: Secondary | ICD-10-CM | POA: Diagnosis not present

## 2014-04-27 DIAGNOSIS — E119 Type 2 diabetes mellitus without complications: Secondary | ICD-10-CM | POA: Diagnosis not present

## 2014-05-09 ENCOUNTER — Other Ambulatory Visit: Payer: Self-pay | Admitting: Cardiology

## 2014-05-09 NOTE — Telephone Encounter (Signed)
Rx refill sent to patient pharmacy   

## 2014-05-22 DIAGNOSIS — I5040 Unspecified combined systolic (congestive) and diastolic (congestive) heart failure: Secondary | ICD-10-CM | POA: Diagnosis not present

## 2014-05-29 ENCOUNTER — Other Ambulatory Visit: Payer: Self-pay | Admitting: Cardiovascular Disease

## 2014-06-08 ENCOUNTER — Telehealth: Payer: Self-pay | Admitting: Cardiovascular Disease

## 2014-06-08 NOTE — Telephone Encounter (Signed)
Left message for [employer] nurse to return call. 

## 2014-06-14 ENCOUNTER — Telehealth: Payer: Self-pay | Admitting: *Deleted

## 2014-06-14 NOTE — Telephone Encounter (Signed)
Case manager returned last week's call, left me voice mail requesting recent EF%, BP, HR.  Left this information on secured VM per case manager request.

## 2014-06-22 DIAGNOSIS — I5040 Unspecified combined systolic (congestive) and diastolic (congestive) heart failure: Secondary | ICD-10-CM | POA: Diagnosis not present

## 2014-07-06 DIAGNOSIS — I441 Atrioventricular block, second degree: Secondary | ICD-10-CM | POA: Diagnosis not present

## 2014-07-10 ENCOUNTER — Telehealth: Payer: Self-pay | Admitting: Cardiovascular Disease

## 2014-07-10 NOTE — Telephone Encounter (Signed)
She wants to know if you received her pacemake transmission from last week? Please let her know asap please.

## 2014-07-12 NOTE — Telephone Encounter (Signed)
Pt is still waiting to see if you received her transmission.

## 2014-07-12 NOTE — Telephone Encounter (Signed)
Informed pt daughter that remote transmission was received on 07-06-14. She verbalized understanding.

## 2014-07-13 ENCOUNTER — Ambulatory Visit (INDEPENDENT_AMBULATORY_CARE_PROVIDER_SITE_OTHER): Payer: Medicare Other

## 2014-07-13 ENCOUNTER — Ambulatory Visit: Payer: Medicare Other | Admitting: *Deleted

## 2014-07-13 ENCOUNTER — Telehealth: Payer: Self-pay | Admitting: Cardiology

## 2014-07-13 DIAGNOSIS — I441 Atrioventricular block, second degree: Secondary | ICD-10-CM

## 2014-07-13 LAB — CUP PACEART REMOTE DEVICE CHECK
Battery Voltage: 3.01 V
Brady Statistic AP VP Percent: 99.91 %
Brady Statistic AS VP Percent: 0.05 %
Brady Statistic AS VS Percent: 0 %
Brady Statistic RA Percent Paced: 99.95 %
Lead Channel Impedance Value: 342 Ohm
Lead Channel Impedance Value: 380 Ohm
Lead Channel Impedance Value: 494 Ohm
Lead Channel Impedance Value: 513 Ohm
Lead Channel Impedance Value: 665 Ohm
Lead Channel Impedance Value: 665 Ohm
Lead Channel Pacing Threshold Amplitude: 1.25 V
Lead Channel Pacing Threshold Pulse Width: 0.4 ms
Lead Channel Pacing Threshold Pulse Width: 0.4 ms
Lead Channel Pacing Threshold Pulse Width: 0.4 ms
Lead Channel Sensing Intrinsic Amplitude: 1.875 mV
Lead Channel Sensing Intrinsic Amplitude: 1.875 mV
Lead Channel Sensing Intrinsic Amplitude: 26.375 mV
Lead Channel Sensing Intrinsic Amplitude: 27.375 mV
Lead Channel Setting Pacing Amplitude: 2 V
Lead Channel Setting Pacing Amplitude: 2 V
Lead Channel Setting Pacing Pulse Width: 0.4 ms
Lead Channel Setting Sensing Sensitivity: 4 mV
MDC IDC MSMT BATTERY REMAINING LONGEVITY: 59 mo
MDC IDC MSMT LEADCHNL LV IMPEDANCE VALUE: 323 Ohm
MDC IDC MSMT LEADCHNL LV IMPEDANCE VALUE: 475 Ohm
MDC IDC MSMT LEADCHNL LV PACING THRESHOLD AMPLITUDE: 1 V
MDC IDC MSMT LEADCHNL RA IMPEDANCE VALUE: 418 Ohm
MDC IDC MSMT LEADCHNL RA PACING THRESHOLD AMPLITUDE: 0.75 V
MDC IDC SESS DTM: 20160623182359
MDC IDC SET LEADCHNL RV PACING AMPLITUDE: 2.5 V
MDC IDC SET LEADCHNL RV PACING PULSEWIDTH: 0.4 ms
MDC IDC SET ZONE DETECTION INTERVAL: 400 ms
MDC IDC STAT BRADY AP VS PERCENT: 0.03 %
MDC IDC STAT BRADY RV PERCENT PACED: 99.97 %
Zone Setting Detection Interval: 400 ms

## 2014-07-13 NOTE — Telephone Encounter (Signed)
Attempted to confirm remote transmission with pt. No answer and was unable to leave a message.   

## 2014-07-18 ENCOUNTER — Encounter: Payer: Self-pay | Admitting: Cardiology

## 2014-07-19 ENCOUNTER — Other Ambulatory Visit: Payer: Self-pay | Admitting: Cardiovascular Disease

## 2014-07-19 NOTE — Telephone Encounter (Signed)
Rx(s) sent to pharmacy electronically.  

## 2014-07-22 DIAGNOSIS — I5040 Unspecified combined systolic (congestive) and diastolic (congestive) heart failure: Secondary | ICD-10-CM | POA: Diagnosis not present

## 2014-07-25 NOTE — Progress Notes (Signed)
Remote pacemaker transmission.   

## 2014-08-08 DIAGNOSIS — R5383 Other fatigue: Secondary | ICD-10-CM | POA: Diagnosis not present

## 2014-08-08 DIAGNOSIS — Z6824 Body mass index (BMI) 24.0-24.9, adult: Secondary | ICD-10-CM | POA: Diagnosis not present

## 2014-08-08 DIAGNOSIS — E119 Type 2 diabetes mellitus without complications: Secondary | ICD-10-CM | POA: Diagnosis not present

## 2014-08-09 ENCOUNTER — Other Ambulatory Visit: Payer: Self-pay | Admitting: Cardiovascular Disease

## 2014-08-09 NOTE — Telephone Encounter (Signed)
REFILL 

## 2014-08-10 ENCOUNTER — Other Ambulatory Visit: Payer: Self-pay | Admitting: Cardiovascular Disease

## 2014-08-10 DIAGNOSIS — R945 Abnormal results of liver function studies: Secondary | ICD-10-CM | POA: Diagnosis not present

## 2014-08-10 DIAGNOSIS — Z79899 Other long term (current) drug therapy: Secondary | ICD-10-CM | POA: Diagnosis not present

## 2014-08-10 NOTE — Telephone Encounter (Signed)
Rx has been sent to the pharmacy electronically. ° °

## 2014-08-22 DIAGNOSIS — E249 Cushing's syndrome, unspecified: Secondary | ICD-10-CM | POA: Diagnosis not present

## 2014-09-11 ENCOUNTER — Other Ambulatory Visit: Payer: Self-pay | Admitting: Cardiovascular Disease

## 2014-10-03 ENCOUNTER — Encounter: Payer: Self-pay | Admitting: Endocrinology

## 2014-10-03 ENCOUNTER — Ambulatory Visit (INDEPENDENT_AMBULATORY_CARE_PROVIDER_SITE_OTHER): Payer: Medicare Other | Admitting: Endocrinology

## 2014-10-03 VITALS — BP 134/87 | HR 91 | Temp 98.6°F | Ht 68.0 in | Wt 157.0 lb

## 2014-10-03 DIAGNOSIS — E270 Other adrenocortical overactivity: Secondary | ICD-10-CM | POA: Diagnosis not present

## 2014-10-03 MED ORDER — DEXAMETHASONE 1 MG PO TABS: ORAL_TABLET | ORAL | Status: DC

## 2014-10-03 NOTE — Patient Instructions (Addendum)
Please check a 24-HR urine test.  Then, you should do a "dexamethasone suppression test."  for this, you would take dexamethasone 1 mg at 10 pm, then come in for a "cortisol" blood test the next morning before 9 am.  you do not need to be fasting for this test.  We'll let you know about the results. If your blood sugar is low on these days, that can affect the results, so please try to avoid lows on those days.

## 2014-10-03 NOTE — Progress Notes (Signed)
Subjective:    Patient ID: Kathleen [name], female    DOB: 10/10/1924, 81 y.o.   MRN: [id number]  HPI Pt has many years h/o HTN and DM.  She has slightly reduced strength throughout the body, and assoc arthralgias.  She takes no steroid medications. Past Medical History  Diagnosis Date  . Hypertension   . Diabetes mellitus   . High cholesterol   . Atrial fibrillation   . Paroxysmal a-fib 12/13/2010  . PNA (pneumonia)   . CAD (coronary artery disease) 2006  . Acid reflux   . AV block, 2nd degree     S/p Pacemaker 03/2011  . CHF (congestive heart failure)   . Shortness of breath   . Pacemaker 10/16/2011    medtronic  . Nonischemic cardiomyopathy   . Chronic kidney disease (CKD), stage III (moderate)     Past Surgical History  Procedure Laterality Date  . Replacement total knee    . Bladder tact  1970's  . Pacemaker insertion  10/16/2011    Medtronic  . Cholecystectomy  1999  . Bladder tack    . Nm myocar perf wall motion  03/15/2009    Normal  . Cardiac catheterization  10/15/2011    Normal coronaries  . Cardioversion N/A 10/28/2013    Procedure: CARDIOVERSION;  Surgeon: Thurmon Fair, MD;  Location: [facility] ENDOSCOPY;  Service: Cardiovascular;  Laterality: N/A;  . Permanent pacemaker insertion N/A 03/24/2011    Procedure: PERMANENT PACEMAKER INSERTION;  Surgeon: Thurmon Fair, MD;  Location: [facility] CATH LAB;  Service: Cardiovascular;  Laterality: N/A;  . Left and right heart catheterization with coronary angiogram N/A 10/15/2011    Procedure: LEFT AND RIGHT HEART CATHETERIZATION WITH CORONARY ANGIOGRAM;  Surgeon: Chrystie Nose, MD;  Location: [facility] CATH LAB;  Service: Cardiovascular;  Laterality: N/A;    Social History   Social History  . Marital Status: Widowed    Spouse Name: N/A  . Number of Children: N/A  . Years of Education: N/A   Occupational History  . Not on file.   Social History Main Topics  . Smoking status: Never Smoker   . Smokeless tobacco: Never Used    . Alcohol Use: No  . Drug Use: No  . Sexual Activity: Not Currently   Other Topics Concern  . Not on file   Social History Narrative    Current Outpatient Prescriptions on File Prior to Visit  Medication Sig Dispense Refill  . amiodarone (PACERONE) 200 MG tablet TAKE 1 TABLET BY MOUTH ONCE DAILY. 30 tablet 5  . cholecalciferol (VITAMIN D) 1000 UNITS tablet Take 1,000 Units by mouth daily.    Marland Kitchen diltiazem (CARDIZEM CD) 240 MG 24 hr capsule Take 1 capsule (240 mg total) by mouth daily. 30 capsule 9  . docusate sodium (COLACE) 100 MG capsule Take 100 mg by mouth 2 (two) times daily as needed for mild constipation.     Marland Kitchen ELIQUIS 2.5 MG TABS tablet TAKE ONE TABLET BY MOUTH TWICE DAILY. 60 tablet 5  . furosemide (LASIX) 40 MG tablet TAKE 1 TABLET BY MOUTH ONCE DAILY. 30 tablet 9  . glimepiride (AMARYL) 1 MG tablet Take 0.5 tablets (0.5 mg total) by mouth daily with breakfast. 30 tablet 1  . meclizine (ANTIVERT) 25 MG tablet Take 25 mg by mouth as needed for dizziness.    . Multiple Vitamins-Minerals (CENTRUM SILVER PO) Take 1 tablet by mouth daily.    Marland Kitchen omeprazole (PRILOSEC) 20 MG capsule TAKE ONE CAPSULE BY MOUTH DAILY.  30 capsule 11  . ondansetron (ZOFRAN) 4 MG tablet Take 4 mg by mouth every 4 (four) hours as needed for nausea or vomiting.    . potassium chloride SA (K-DUR,KLOR-CON) 20 MEQ tablet TAKE 1 TABLET BY MOUTH ONCE DAILY. 30 tablet 10  . pravastatin (PRAVACHOL) 40 MG tablet Take 1 tablet (40 mg total) by mouth daily. 30 tablet 8  . [DISCONTINUED] benazepril (LOTENSIN) 40 MG tablet Take 40 mg by mouth 2 (two) times daily.     No current facility-administered medications on file prior to visit.    Allergies  Allergen Reactions  . Coumadin [Warfarin Sodium] Other (See Comments)    Caused Patient to Bleed Out.   . Meloxicam Other (See Comments)    Unknown  . Metformin And Related Other (See Comments)    Cannot have due to kidney function  . Morphine Itching    Not in right  state of mind.   . Nabumetone Nausea And Vomiting  . Sulfonamide Derivatives Hives    Family History  Problem Relation Age of Onset  . Heart murmur Daughter   . Suicidality Mother   . Heart attack Brother     BP 134/87 mmHg  Pulse 91  Temp(Src) 98.6 F (37 C) (Oral)  Ht  (1.727 m)  Wt 157 lb (71.215 kg)  BMI 23.88 kg/m2  SpO2 90%  Review of Systems denies weight gain, headache, hirsutism, hair loss, excessive diaphoresis, polyuria, menopausal sxs, sob, hyperpigmentation, cramps, numbness, depression, and rash on the abdomen.  She has cold intolerance and easy bruising.      Objective:   Physical Exam VS: see vs page GEN: no distress HEAD: head: no deformity eyes: no periorbital swelling, no proptosis external nose and ears are normal mouth: no lesion seen NECK: supple, thyroid is not enlarged CHEST WALL: no deformity.  Kyphosis is noted.   LUNGS:  Clear to auscultation.   CV: reg rate and rhythm, no murmur.   ABD: abdomen is soft, nontender.  no hepatosplenomegaly.  not distended.  no hernia MUSCULOSKELETAL: muscle bulk and strength are grossly normal.  no obvious joint swelling.  gait is normal and steady.   EXTEMITIES: no deformity.  no edema.   PULSES: no carotid bruit NEURO:  cn 2-12 grossly intact.   readily moves all 4's.  sensation is intact to touch on all 4's SKIN:  Normal texture and temperature.  No rash or suspicious lesion is visible.  No striae on the abdomen. NODES:  None palpable at the neck PSYCH: alert, well-oriented.  Does not appear anxious nor depressed.   outside test results are reviewed: Random cortisol=24  i personally reviewed electrocardiogram tracing (12/19/13): Indication: PAF Impression: paced  Lab Results  Component Value Date   TSH 4.222 04/11/2014      Assessment & Plan:  Hypercortisolemia, new to me, possibly due to hypoglycemia, but this is not certain.    Patient is advised the following: Patient Instructions    Please check a 24-HR urine test.  Then, you should do a "dexamethasone suppression test."  for this, you would take dexamethasone 1 mg at 10 pm, then come in for a "cortisol" blood test the next morning before 9 am.  you do not need to be fasting for this test.  We'll let you know about the results. If your blood sugar is low on these days, that can affect the results, so please try to avoid lows on those days.

## 2014-10-04 NOTE — Progress Notes (Signed)
   Subjective:    Patient ID: Kathleen [name], female    DOB: [birth year], 81 y.o.   MRN: [id number]  HPI    Review of Systems     Objective:   Physical Exam    Radiol: heat CT (05/13/11): no mention is made of the pituitary    Assessment & Plan:

## 2014-10-05 ENCOUNTER — Other Ambulatory Visit: Payer: Medicare Other

## 2014-10-05 DIAGNOSIS — E270 Other adrenocortical overactivity: Principal | ICD-10-CM

## 2014-10-06 ENCOUNTER — Other Ambulatory Visit: Payer: Self-pay

## 2014-10-06 DIAGNOSIS — E270 Other adrenocortical overactivity: Principal | ICD-10-CM

## 2014-10-07 LAB — CORTISOL: CORTISOL PLASMA: 2 ug/dL

## 2014-10-10 LAB — CORTISOL, URINE, 24 HOUR
CORTISOL (UR), FREE: 12.7 ug/(24.h) (ref 4.0–50.0)
RESULTS RECEIVED: 0.74 g/(24.h) (ref 0.63–2.50)

## 2014-10-11 ENCOUNTER — Other Ambulatory Visit: Payer: Self-pay | Admitting: Cardiovascular Disease

## 2014-10-11 NOTE — Telephone Encounter (Signed)
Rx(s) sent to pharmacy electronically.  

## 2014-10-16 ENCOUNTER — Telehealth: Payer: Self-pay | Admitting: Orthopedic Surgery

## 2014-10-16 NOTE — Telephone Encounter (Signed)
Dr. Darlina Sicilian office was on voicemail regarding Ms. Mackintosh asking if she needs to be premedicated before any dental treatment due to having knee surgery, please advise at [phone]

## 2014-10-16 NOTE — Telephone Encounter (Signed)
Advised yes.

## 2014-10-27 ENCOUNTER — Emergency Department (HOSPITAL_COMMUNITY): Payer: Medicare Other

## 2014-10-27 ENCOUNTER — Emergency Department (HOSPITAL_COMMUNITY)
Admission: EM | Admit: 2014-10-27 | Discharge: 2014-10-27 | Disposition: A | Discharge status: Home / Self Care | Payer: Medicare Other | Attending: Emergency Medicine | Admitting: Emergency Medicine

## 2014-10-27 ENCOUNTER — Encounter (HOSPITAL_COMMUNITY): Payer: Self-pay

## 2014-10-27 DIAGNOSIS — Z7982 Long term (current) use of aspirin: Secondary | ICD-10-CM | POA: Insufficient documentation

## 2014-10-27 DIAGNOSIS — R0602 Shortness of breath: Secondary | ICD-10-CM | POA: Insufficient documentation

## 2014-10-27 DIAGNOSIS — I509 Heart failure, unspecified: Secondary | ICD-10-CM | POA: Diagnosis not present

## 2014-10-27 DIAGNOSIS — Z9889 Other specified postprocedural states: Secondary | ICD-10-CM | POA: Insufficient documentation

## 2014-10-27 DIAGNOSIS — Z8701 Personal history of pneumonia (recurrent): Secondary | ICD-10-CM | POA: Insufficient documentation

## 2014-10-27 DIAGNOSIS — Z79899 Other long term (current) drug therapy: Secondary | ICD-10-CM | POA: Insufficient documentation

## 2014-10-27 DIAGNOSIS — E119 Type 2 diabetes mellitus without complications: Secondary | ICD-10-CM | POA: Diagnosis not present

## 2014-10-27 DIAGNOSIS — Z7984 Long term (current) use of oral hypoglycemic drugs: Secondary | ICD-10-CM | POA: Insufficient documentation

## 2014-10-27 DIAGNOSIS — K219 Gastro-esophageal reflux disease without esophagitis: Secondary | ICD-10-CM | POA: Insufficient documentation

## 2014-10-27 DIAGNOSIS — I129 Hypertensive chronic kidney disease with stage 1 through stage 4 chronic kidney disease, or unspecified chronic kidney disease: Secondary | ICD-10-CM | POA: Insufficient documentation

## 2014-10-27 DIAGNOSIS — R0789 Other chest pain: Secondary | ICD-10-CM | POA: Diagnosis not present

## 2014-10-27 DIAGNOSIS — E7800 Pure hypercholesterolemia, unspecified: Secondary | ICD-10-CM | POA: Diagnosis not present

## 2014-10-27 DIAGNOSIS — R079 Chest pain, unspecified: Secondary | ICD-10-CM | POA: Diagnosis not present

## 2014-10-27 DIAGNOSIS — Z950 Presence of cardiac pacemaker: Secondary | ICD-10-CM | POA: Diagnosis not present

## 2014-10-27 DIAGNOSIS — M7989 Other specified soft tissue disorders: Secondary | ICD-10-CM | POA: Insufficient documentation

## 2014-10-27 DIAGNOSIS — N183 Chronic kidney disease, stage 3 (moderate): Secondary | ICD-10-CM | POA: Insufficient documentation

## 2014-10-27 DIAGNOSIS — I2510 Atherosclerotic heart disease of native coronary artery without angina pectoris: Secondary | ICD-10-CM | POA: Diagnosis not present

## 2014-10-27 DIAGNOSIS — R0902 Hypoxemia: Secondary | ICD-10-CM | POA: Diagnosis not present

## 2014-10-27 LAB — CBC WITH DIFFERENTIAL/PLATELET
BASOS ABS: 0 10*3/uL (ref 0.0–0.1)
BASOS PCT: 0 %
EOS PCT: 1 %
Eosinophils Absolute: 0.1 10*3/uL (ref 0.0–0.7)
HEMATOCRIT: 36 % (ref 36.0–46.0)
Hemoglobin: 12.1 g/dL (ref 12.0–15.0)
LYMPHS PCT: 12 %
Lymphs Abs: 0.8 10*3/uL (ref 0.7–4.0)
MCH: 32.9 pg (ref 26.0–34.0)
MCHC: 33.6 g/dL (ref 30.0–36.0)
MCV: 97.8 fL (ref 78.0–100.0)
Monocytes Absolute: 0.5 10*3/uL (ref 0.1–1.0)
Monocytes Relative: 9 %
NEUTROS ABS: 5 10*3/uL (ref 1.7–7.7)
Neutrophils Relative %: 78 %
PLATELETS: 225 10*3/uL (ref 150–400)
RBC: 3.68 MIL/uL — AB (ref 3.87–5.11)
RDW: 14.2 % (ref 11.5–15.5)
WBC: 6.4 10*3/uL (ref 4.0–10.5)

## 2014-10-27 LAB — BASIC METABOLIC PANEL
ANION GAP: 9 (ref 5–15)
BUN: 13 mg/dL (ref 6–20)
CO2: 28 mmol/L (ref 22–32)
Calcium: 9.2 mg/dL (ref 8.9–10.3)
Chloride: 100 mmol/L — ABNORMAL LOW (ref 101–111)
Creatinine, Ser: 1.29 mg/dL — ABNORMAL HIGH (ref 0.44–1.00)
GFR, EST AFRICAN AMERICAN: 42 mL/min — AB (ref >60)
GFR, EST NON AFRICAN AMERICAN: 36 mL/min — AB (ref >60)
GLUCOSE: 198 mg/dL — AB (ref 65–99)
POTASSIUM: 4.2 mmol/L (ref 3.5–5.1)
Sodium: 137 mmol/L (ref 135–145)

## 2014-10-27 LAB — TROPONIN I

## 2014-10-27 LAB — BRAIN NATRIURETIC PEPTIDE: B Natriuretic Peptide: 530 pg/mL — ABNORMAL HIGH (ref 0.0–100.0)

## 2014-10-27 MED ORDER — NITROGLYCERIN 0.4 MG SL SUBL: 0.4000 mg | SUBLINGUAL_TABLET | SUBLINGUAL | Status: DC | PRN

## 2014-10-27 NOTE — ED Notes (Signed)
Pt states she has pressure in her chest . States it started about an hour ago. States she had an episode of coughing and felt like she was chocking.

## 2014-10-27 NOTE — ED Provider Notes (Signed)
CSN: [id number]     Arrival date & time 10/27/14  1027 History  By signing my name below, I, Kathleen [name], attest that this documentation has been prepared under the direction and in the presence of Donnetta Hutching, MD. Electronically Signed: Marica [name], ED Scribe. 10/27/2014. 11:25 AM.  Chief Complaint  Patient presents with  . Chest Pain   The history is provided by the patient. No language interpreter was used.   PCP: [name]., MD HPI Comments: Kathleen [name] is a 81 y.o. female, arriving via ambulance, with PMHx noted below including HTN, afib, CAD, CHF, pacemaker insertion on 03/2011 and replaced on 10/2011 (completed by Dr. Ladona Ridgel, [facility]), nonischemic cardiomyopathy, and cardioversion who presents to the Emergency Department complaining of improving chest tightness with associated SOB onset at 4AM this morning. Pt reports her Sx persisted for a couple of hours. Pt denies any Hx of heart attacks, swelling of BLE (pt notes some swelling of BLE at night at baseline),  Hx of angina, nitroglycerin use at home.  Past Medical History  Diagnosis Date  . Hypertension   . Diabetes mellitus   . High cholesterol   . Atrial fibrillation (HCC)   . Paroxysmal a-fib (HCC) 12/13/2010  . PNA (pneumonia)   . CAD (coronary artery disease) 2006  . Acid reflux   . AV block, 2nd degree     S/p Pacemaker 03/2011  . CHF (congestive heart failure) (HCC)   . Shortness of breath   . Pacemaker 10/16/2011    medtronic  . Nonischemic cardiomyopathy (HCC)   . Chronic kidney disease (CKD), stage III (moderate)    Past Surgical History  Procedure Laterality Date  . Replacement total knee    . Bladder tact  1970's  . Pacemaker insertion  10/16/2011    Medtronic  . Cholecystectomy  1999  . Bladder tack    . Nm myocar perf wall motion  03/15/2009    Normal  . Cardiac catheterization  10/15/2011    Normal coronaries  . Cardioversion N/A 10/28/2013    Procedure: CARDIOVERSION;  Surgeon:  Thurmon Fair, MD;  Location[facility] ENDOSCOPY;  Service: Cardiovascular;  Laterality: N/A;  . Permanent pacemaker insertion N/A 03/24/2011    Procedure: PERMANENT PACEMAKER INSERTION;  Surgeon: Thurmon Fair, MD;  Location: [facility] CATH LAB;  Service: Cardiovascular;  Laterality: N/A;  . Left and right heart catheterization with coronary angiogram N/A 10/15/2011    Procedure: LEFT AND RIGHT HEART CATHETERIZATION WITH CORONARY ANGIOGRAM;  Surgeon: Chrystie Nose, MD;  Location: [facility] CATH LAB;  Service: Cardiovascular;  Laterality: N/A;   Family History  Problem Relation Age of Onset  . Heart murmur Daughter   . Suicidality Mother   . Heart attack Brother    Social History  Substance Use Topics  . Smoking status: Never Smoker   . Smokeless tobacco: Never Used  . Alcohol Use: No   OB History    No data available     Review of Systems  Respiratory: Positive for chest tightness and shortness of breath.   Cardiovascular: Positive for leg swelling (at baseline, no new, worsening Sx).    A complete 10 system review of systems was obtained and all systems are negative except as noted in the HPI and PMH.   Allergies  Coumadin; Meloxicam; Metformin and related; Morphine; Nabumetone; and Sulfonamide derivatives  Home Medications   Prior to Admission medications   Medication Sig Start Date End Date Taking? Authorizing Provider  amiodarone (PACERONE) 200 MG  tablet TAKE 1 TABLET BY MOUTH ONCE DAILY. 08/10/14  Yes Mihai Croitoru, MD  aspirin EC 81 MG tablet Take 81 mg by mouth daily as needed for mild pain.   Yes Historical Provider, MD  benzonatate (TESSALON) 200 MG capsule Take 200 mg by mouth 3 (three) times daily as needed for cough.   Yes Historical Provider, MD  Coenzyme Q10 (CO Q 10 PO) Take 1 tablet by mouth daily.   Yes Historical Provider, MD  diltiazem (CARDIZEM CD) 240 MG 24 hr capsule TAKE ONE CAPSULE BY MOUTH DAILY. 10/11/14  Yes Mihai Croitoru, MD  docusate sodium (COLACE) 100 MG  capsule Take 100 mg by mouth 2 (two) times daily as needed for mild constipation.    Yes Historical Provider, MD  ELIQUIS 2.5 MG TABS tablet TAKE ONE TABLET BY MOUTH TWICE DAILY. 08/10/14  Yes Mihai Croitoru, MD  furosemide (LASIX) 40 MG tablet Take 0.5-1 mg by mouth daily as needed for fluid.   Yes Historical Provider, MD  glimepiride (AMARYL) 1 MG tablet Take 0.5 tablets (0.5 mg total) by mouth daily with breakfast. 10/20/13  Yes Estela Isaiah Blakes, MD  meclizine (ANTIVERT) 25 MG tablet Take 25 mg by mouth as needed for dizziness.   Yes Historical Provider, MD  metoprolol tartrate (LOPRESSOR) 25 MG tablet Take 25 mg by mouth 2 (two) times daily.   Yes Historical Provider, MD  Multiple Vitamins-Minerals (CENTRUM SILVER PO) Take 1 tablet by mouth daily.   Yes Historical Provider, MD  omeprazole (PRILOSEC) 20 MG capsule TAKE ONE CAPSULE BY MOUTH DAILY. 05/09/14  Yes Mihai Croitoru, MD  ondansetron (ZOFRAN) 4 MG tablet Take 4 mg by mouth every 4 (four) hours as needed for nausea or vomiting.   Yes Historical Provider, MD  potassium chloride SA (K-DUR,KLOR-CON) 20 MEQ tablet TAKE 1 TABLET BY MOUTH ONCE DAILY. 05/29/14  Yes Mihai Croitoru, MD  pravastatin (PRAVACHOL) 40 MG tablet Take 1 tablet (40 mg total) by mouth daily. 07/19/14  Yes Mihai Croitoru, MD  dexamethasone (DECADRON) 1 MG tablet Take at 9-10 PM, the night before blood test Patient not taking: Reported on 10/27/2014 10/03/14   Romero Belling, MD  nitroGLYCERIN (NITROSTAT) 0.4 MG SL tablet Place 1 tablet (0.4 mg total) under the tongue every 5 (five) minutes as needed for chest pain. 10/27/14   Donnetta Hutching, MD   Triage Vitals: BP 154/80 mmHg  Pulse 94  Temp(Src) 97.9 F (36.6 C) (Oral)  Ht  (1.727 m)  Wt 154 lb (69.854 kg)  BMI 23.42 kg/m2  SpO2 100% Physical Exam  Constitutional: She is oriented to person, place, and time. She appears well-developed and well-nourished.  HENT:  Head: Normocephalic and atraumatic.  Eyes:  Conjunctivae and EOM are normal. Pupils are equal, round, and reactive to light.  Neck: Normal range of motion. Neck supple.  Cardiovascular: Normal rate and regular rhythm.   Pulmonary/Chest: Effort normal and breath sounds normal.  Abdominal: Soft. Bowel sounds are normal.  Musculoskeletal: Normal range of motion.  Neurological: She is alert and oriented to person, place, and time.  Skin: Skin is warm and dry.  Psychiatric: She has a normal mood and affect. Her behavior is normal.  Nursing note and vitals reviewed.   ED Course  Procedures (including critical care time) DIAGNOSTIC STUDIES: Oxygen Saturation is 100% on RA, nl by my interpretation.    COORDINATION OF CARE: 11:21 AM: Discussed treatment plan with pt at bedside; patient verbalizes understanding and agrees with treatment plan.  Labs Review Labs Reviewed  CBC WITH DIFFERENTIAL/PLATELET - Abnormal; Notable for the following:    RBC 3.68 (*)    All other components within normal limits  BASIC METABOLIC PANEL - Abnormal; Notable for the following:    Chloride 100 (*)    Glucose, Bld 198 (*)    Creatinine, Ser 1.29 (*)    GFR calc non Af Amer 36 (*)    GFR calc Af Amer 42 (*)    All other components within normal limits  BRAIN NATRIURETIC PEPTIDE - Abnormal; Notable for the following:    B Natriuretic Peptide 530.0 (*)    All other components within normal limits  TROPONIN I    Imaging Review Dg Chest Portable 1 View  10/27/2014  CLINICAL DATA:  Chest pressure and tightness for 1 hour. Hypertension, atrial fibrillation, CHF. EXAM: PORTABLE CHEST 1 VIEW COMPARISON:  10/18/2013 FINDINGS: Cardiomegaly. Hyperinflation/COPD. Left pacer is in place, unchanged. Diffuse interstitial prominence again noted with patchy bilateral airspace opacities, particularly in the right upper lobe and right lung base. This could represent edema and/or infection. Findings similar to prior study. Suspect small effusions. IMPRESSION: No real  change since prior study with diffuse interstitial prominence and patchy right lung airspace disease. Findings could reflect edema and/or infection. Cardiomegaly.  COPD. Electronically Signed   By: Charlett Nose M.D.   On: 10/27/2014 11:20   I have personally reviewed and evaluated these images and lab results as part of my medical decision-making.   EKG Interpretation   Date/Time:  Friday October 27 2014 10:40:23 EDT Ventricular Rate:  78 PR Interval:  232 QRS Duration: 164 QT Interval:  490 QTC Calculation: 558 R Axis:   -83 Text Interpretation:  Sinus rhythm Prolonged PR interval Probable left  atrial enlargement IVCD, consider atypical RBBB Left ventricular  hypertrophy Inferior infarct, old Anterior infarct, old Confirmed by Avyaan Summer   MD, Giovana Faciane ([id number]) on 10/27/2014 10:52:41 AM      MDM   Final diagnoses:  Chest pain, unspecified chest pain type    Patient is hemodynamically stable. Chest x-ray reviewed. It is not dissimilar to the x-ray taken approximately 1 year ago. EKG and troponin showed no acute changes.  BNP minimally elevated.  Discussed findings with the patient and her daughter. Prescription for nitroglycerin given. Also recommended to use Lasix 40 mg when necessary.  Patient has primary care follow-up  I, Aviya Jarvie, personally performed the services described in this documentation. All medical record entries made by the scribe were at my direction and in my presence.  I have reviewed the chart and discharge instructions and agree that the record reflects my personal performance and is accurate and complete. Robertlee Rogacki.  10/27/2014. 3:07 PM.     Donnetta Hutching, MD 10/27/14 [phone]

## 2014-10-27 NOTE — ED Notes (Signed)
Patient d/c papers/ prescription give and reviewed. Patient verbalized understanding.

## 2014-10-27 NOTE — ED Notes (Signed)
MD at bedside. 

## 2014-10-27 NOTE — ED Notes (Signed)
Patient signed e-sig, did not show up on chart.

## 2014-10-27 NOTE — Discharge Instructions (Signed)
Tests showed no life-threatening condition. Chest x-ray similar to the one taken approximately 1 year ago. Prescription for nitroglycerin. You can also take your Lasix or Furosemide for fluid buildup.

## 2014-11-06 DIAGNOSIS — I10 Essential (primary) hypertension: Secondary | ICD-10-CM | POA: Diagnosis not present

## 2014-11-06 DIAGNOSIS — E782 Mixed hyperlipidemia: Secondary | ICD-10-CM | POA: Diagnosis not present

## 2014-11-06 DIAGNOSIS — I4891 Unspecified atrial fibrillation: Secondary | ICD-10-CM | POA: Diagnosis not present

## 2014-11-06 DIAGNOSIS — N183 Chronic kidney disease, stage 3 (moderate): Secondary | ICD-10-CM | POA: Diagnosis not present

## 2014-11-06 DIAGNOSIS — Z1389 Encounter for screening for other disorder: Secondary | ICD-10-CM | POA: Diagnosis not present

## 2014-11-07 ENCOUNTER — Ambulatory Visit (INDEPENDENT_AMBULATORY_CARE_PROVIDER_SITE_OTHER): Payer: Medicare Other | Admitting: Cardiovascular Disease

## 2014-11-07 ENCOUNTER — Encounter: Payer: Self-pay | Admitting: Cardiovascular Disease

## 2014-11-07 VITALS — BP 141/77 | HR 85 | Resp 16 | Ht 67.0 in | Wt 156.0 lb

## 2014-11-07 DIAGNOSIS — I4891 Unspecified atrial fibrillation: Secondary | ICD-10-CM | POA: Diagnosis not present

## 2014-11-07 DIAGNOSIS — I442 Atrioventricular block, complete: Secondary | ICD-10-CM

## 2014-11-07 DIAGNOSIS — I5021 Acute systolic (congestive) heart failure: Secondary | ICD-10-CM

## 2014-11-07 DIAGNOSIS — Z79899 Other long term (current) drug therapy: Secondary | ICD-10-CM

## 2014-11-07 DIAGNOSIS — I420 Dilated cardiomyopathy: Secondary | ICD-10-CM

## 2014-11-07 DIAGNOSIS — I5042 Chronic combined systolic (congestive) and diastolic (congestive) heart failure: Secondary | ICD-10-CM | POA: Diagnosis not present

## 2014-11-07 DIAGNOSIS — I429 Cardiomyopathy, unspecified: Secondary | ICD-10-CM | POA: Diagnosis not present

## 2014-11-07 LAB — CUP PACEART INCLINIC DEVICE CHECK
Battery Voltage: 3 V
Brady Statistic AP VP Percent: 99.93 %
Brady Statistic AS VP Percent: 0.05 %
Brady Statistic RA Percent Paced: 99.95 %
Date Time Interrogation Session: 20161025160208
Implantable Lead Implant Date: 20130311
Implantable Lead Implant Date: 20131003
Implantable Lead Location: 753859
Implantable Lead Model: 4194
Implantable Lead Model: 5086
Lead Channel Impedance Value: 323 Ohm
Lead Channel Impedance Value: 361 Ohm
Lead Channel Impedance Value: 475 Ohm
Lead Channel Impedance Value: 665 Ohm
Lead Channel Impedance Value: 665 Ohm
Lead Channel Pacing Threshold Amplitude: 0.875 V
Lead Channel Pacing Threshold Pulse Width: 0.4 ms
Lead Channel Pacing Threshold Pulse Width: 0.4 ms
Lead Channel Pacing Threshold Pulse Width: 0.4 ms
Lead Channel Sensing Intrinsic Amplitude: 1.625 mV
Lead Channel Sensing Intrinsic Amplitude: 1.625 mV
Lead Channel Sensing Intrinsic Amplitude: 22.625 mV
Lead Channel Setting Pacing Amplitude: 2 V
Lead Channel Setting Pacing Pulse Width: 0.4 ms
Lead Channel Setting Sensing Sensitivity: 4 mV
MDC IDC LEAD IMPLANT DT: 20130311
MDC IDC LEAD LOCATION: 753858
MDC IDC LEAD LOCATION: 753860
MDC IDC LEAD MODEL: 5086
MDC IDC MSMT BATTERY REMAINING LONGEVITY: 52 mo
MDC IDC MSMT LEADCHNL LV IMPEDANCE VALUE: 532 Ohm
MDC IDC MSMT LEADCHNL LV PACING THRESHOLD AMPLITUDE: 1.125 V
MDC IDC MSMT LEADCHNL RA IMPEDANCE VALUE: 437 Ohm
MDC IDC MSMT LEADCHNL RV IMPEDANCE VALUE: 399 Ohm
MDC IDC MSMT LEADCHNL RV IMPEDANCE VALUE: 513 Ohm
MDC IDC MSMT LEADCHNL RV PACING THRESHOLD AMPLITUDE: 1.375 V
MDC IDC MSMT LEADCHNL RV SENSING INTR AMPL: 22.625 mV
MDC IDC SET LEADCHNL LV PACING AMPLITUDE: 2.25 V
MDC IDC SET LEADCHNL LV PACING PULSEWIDTH: 0.4 ms
MDC IDC SET LEADCHNL RV PACING AMPLITUDE: 2.75 V
MDC IDC STAT BRADY AP VS PERCENT: 0.02 %
MDC IDC STAT BRADY AS VS PERCENT: 0 %
MDC IDC STAT BRADY RV PERCENT PACED: 99.98 %

## 2014-11-07 NOTE — Patient Instructions (Signed)
Your physician recommends that you weigh, daily, at the same time every day, and in the same amount of clothing. Please record your daily weights on the handout provided and bring it to your next appointment. IF YOUR WEIGHT IS 155 OR GREATER TAKE THE FUROSEMIDE.  Your physician recommends that you return for lab work in: AT [facility] IF THESE TESTS WERE NOT DONE AT DR. Sherwood Gambler OR [facility] ( [facility]).  Remote monitoring is used to monitor your Pacemaker or ICD from home. This monitoring reduces the number of office visits required to check your device to one time per year. It allows [place] to monitor the functioning of your device to ensure it is working properly. You are scheduled for a device check from home on February 07, 2014. You may send your transmission at any time that day. If you have a wireless device, the transmission will be sent automatically. After your physician reviews your transmission, you will receive a postcard with your next transmission date.  Dr. Royann Shivers recommends that you schedule a follow-up appointment in: 6 MONTHS WITH PACEMAKER CHECK (MEDTRONIC-BLUE).  Daily Weight Record It is important to weigh yourself daily. Keep this daily weight chart near your scale. Weigh yourself each morning at the same time. Weigh yourself without shoes, and wear the same amount of clothing each day. Compare today's weight to yesterday's weight. Bring this form with you to your follow-up appointments. Call your health care provider if you have concerns about your weight, including rapid weight gain or rapid weight loss. Date: ________ Weight: ____________________ Date: ________ Weight: ____________________ Date: ________ Weight: ____________________ Date: ________ Weight: ____________________ Date: ________ Weight: ____________________ Date: ________ Weight: ____________________ Date: ________ Weight: ____________________ Date: ________ Weight: ____________________ Date: ________  Weight: ____________________ Date: ________ Weight: ____________________ Date: ________ Weight: ____________________ Date: ________ Weight: ____________________ Date: ________ Weight: ____________________ Date: ________ Weight: ____________________ Date: ________ Weight: ____________________ Date: ________ Weight: ____________________ Date: ________ Weight: ____________________ Date: ________ Weight: ____________________ Date: ________ Weight: ____________________ Date: ________ Weight: ____________________ Date: ________ Weight: ____________________ Date: ________ Weight: ____________________ Date: ________ Weight: ____________________ Date: ________ Weight: ____________________ Date: ________ Weight: ____________________ Date: ________ Weight: ____________________ Date: ________ Weight: ____________________ Date: ________ Weight: ____________________ Date: ________ Weight: ____________________ Date: ________ Weight: ____________________ Date: ________ Weight: ____________________ Date: ________ Weight: ____________________ Date: ________ Weight: ____________________ Date: ________ Weight: ____________________ Date: ________ Weight: ____________________ Date: ________ Weight: ____________________ Date: ________ Weight: ____________________ Date: ________ Weight: ____________________ Date: ________ Weight: ____________________ Date: ________ Weight: ____________________ Date: ________ Weight: ____________________ Date: ________ Weight: ____________________ Date: ________ Weight: ____________________ Date: ________ Weight: ____________________ Date: ________ Weight: ____________________ Date: ________ Weight: ____________________ Date: ________ Weight: ____________________ Date: ________ Weight: ____________________ Date: ________ Weight: ____________________ Date: ________ Weight: ____________________   This information is not intended to replace advice given to you by your health care  provider. Make sure you discuss any questions you have with your health care provider.   Document Released: 03/13/2006 Document Revised: 01/20/2014 Document Reviewed: 07/29/2013 [employer] Interactive Patient Education [employer]

## 2014-11-08 ENCOUNTER — Encounter: Payer: Self-pay | Admitting: Cardiovascular Disease

## 2014-11-08 NOTE — Progress Notes (Signed)
Patient Kathleen: Kathleen [name], female   DOB: [birth year], 81 y.o.   MRN: [id number]      Cardiology Office Note   Date:  11/08/2014   Kathleen:  TANGALA [name], DOB Feb 16, 1924, MRN [id number]  PCP:  [name]., MD  Cardiologist:   Thurmon Fair, MD   Chief Complaint  Patient presents with  . Hospitalization Follow-up    tightness in chest and SOB      History of Present Illness: Kathleen [name] is a 81 y.o. female who presents for  Follow-up after recent evaluation for chest pain in the emergency room at [facility] She had recently noticed that her weight had increased. She noticed that she had chest pressure on lying flat and went to the emergency room. Her evaluation included repeated sets of normal cardiac enzymes. Her ECG is nondiagnostic due to committed biventricular pacing. Review of her pacemaker today shows that her thoracic impedance (Optivar) showed evidence of hypervolemia, which subsequently has resolved. She is known to have minor coronary atherosclerosis without significant stenosis by angiography in 2013. She had congestive heart failure that developed after she received a pacemaker for second-degree heart block and improved dramatically following institution of cardiac resynchronization pacing with implantation of the Medtronic consult a biventricular device.   Today she is asymptomatic, although her weight at 156 pounds is still substantially higher than her usual weight , which is usually 150-151 pounds on her home scale. There have been no intercurrent illnesses.   ECG shows atrial paced biventricular paced rhythm. She has a prominent R wave in lead V1 consistent with appropriate biventricular pacing. There is  99.9% biV pacing by device counters. Roughly 4 years of January to longevity are anticipated for this device.  Past Medical History  Diagnosis Date  . Hypertension   . Diabetes mellitus   . High cholesterol   . Atrial fibrillation (HCC)   . Paroxysmal  a-fib (HCC) 12/13/2010  . PNA (pneumonia)   . CAD (coronary artery disease) 2006  . Acid reflux   . AV block, 2nd degree     S/p Pacemaker 03/2011  . CHF (congestive heart failure) (HCC)   . Shortness of breath   . Pacemaker 10/16/2011    medtronic  . Nonischemic cardiomyopathy (HCC)   . Chronic kidney disease (CKD), stage III (moderate)     Past Surgical History  Procedure Laterality Date  . Replacement total knee    . Bladder tact  1970's  . Pacemaker insertion  10/16/2011    Medtronic  . Cholecystectomy  1999  . Bladder tack    . Nm myocar perf wall motion  03/15/2009    Normal  . Cardiac catheterization  10/15/2011    Normal coronaries  . Cardioversion N/A 10/28/2013    Procedure: CARDIOVERSION;  Surgeon: Thurmon Fair, MD;  Location: [facility] ENDOSCOPY;  Service: Cardiovascular;  Laterality: N/A;  . Permanent pacemaker insertion N/A 03/24/2011    Procedure: PERMANENT PACEMAKER INSERTION;  Surgeon: Thurmon Fair, MD;  Location: [facility] CATH LAB;  Service: Cardiovascular;  Laterality: N/A;  . Left and right heart catheterization with coronary angiogram N/A 10/15/2011    Procedure: LEFT AND RIGHT HEART CATHETERIZATION WITH CORONARY ANGIOGRAM;  Surgeon: Chrystie Nose, MD;  Location: [facility] CATH LAB;  Service: Cardiovascular;  Laterality: N/A;     Current Outpatient Prescriptions  Medication Sig Dispense Refill  . amiodarone (PACERONE) 200 MG tablet TAKE 1 TABLET BY MOUTH ONCE DAILY. 30 tablet 5  . aspirin EC 81 MG tablet  Take 81 mg by mouth daily as needed for mild pain.    . benzonatate (TESSALON) 200 MG capsule Take 200 mg by mouth 3 (three) times daily as needed for cough.    . Coenzyme Q10 (CO Q 10 PO) Take 1 tablet by mouth daily.    Marland Kitchen diltiazem (CARDIZEM CD) 240 MG 24 hr capsule TAKE ONE CAPSULE BY MOUTH DAILY. 30 capsule 6  . docusate sodium (COLACE) 100 MG capsule Take 100 mg by mouth 2 (two) times daily as needed for mild constipation.     Marland Kitchen ELIQUIS 2.5 MG TABS tablet TAKE ONE  TABLET BY MOUTH TWICE DAILY. 60 tablet 5  . furosemide (LASIX) 40 MG tablet Take 0.5-1 mg by mouth daily as needed for fluid.    Marland Kitchen glimepiride (AMARYL) 1 MG tablet Take 0.5 tablets (0.5 mg total) by mouth daily with breakfast. 30 tablet 1  . meclizine (ANTIVERT) 25 MG tablet Take 25 mg by mouth as needed for dizziness.    . metoprolol tartrate (LOPRESSOR) 25 MG tablet Take 25 mg by mouth 2 (two) times daily.    . Multiple Vitamins-Minerals (CENTRUM SILVER PO) Take 1 tablet by mouth daily.    . nitroGLYCERIN (NITROSTAT) 0.4 MG SL tablet Place 1 tablet (0.4 mg total) under the tongue every 5 (five) minutes as needed for chest pain. 30 tablet 0  . omeprazole (PRILOSEC) 20 MG capsule TAKE ONE CAPSULE BY MOUTH DAILY. 30 capsule 11  . ondansetron (ZOFRAN) 4 MG tablet Take 4 mg by mouth every 4 (four) hours as needed for nausea or vomiting.    . potassium chloride SA (K-DUR,KLOR-CON) 20 MEQ tablet TAKE 1 TABLET BY MOUTH ONCE DAILY. 30 tablet 10  . pravastatin (PRAVACHOL) 40 MG tablet Take 1 tablet (40 mg total) by mouth daily. 30 tablet 8  . [DISCONTINUED] benazepril (LOTENSIN) 40 MG tablet Take 40 mg by mouth 2 (two) times daily.     No current facility-administered medications for this visit.    Allergies:   Coumadin; Meloxicam; Metformin and related; Morphine; Nabumetone; and Sulfonamide derivatives    Social History:  The patient  reports that she has never smoked. She has never used smokeless tobacco. She reports that she does not drink alcohol or use illicit drugs.   Family History:  The patient's   family history includes Heart attack in her brother; Heart murmur in her daughter; Suicidality in her mother.    ROS:  Please see the history of present illness.    Otherwise, review of systems positive for none.   All other systems are reviewed and negative.    PHYSICAL EXAM: VS:  BP 141/77 mmHg  Pulse 85  Ht 5\' 7"  (1.702 m)  Wt 156 lb (70.761 kg)  BMI 24.43 kg/m2 , BMI Body mass index  is 24.43 kg/(m^2).  General: Alert, oriented x3, no distress Head: no evidence of trauma, PERRL, EOMI, no exophtalmos or lid lag, no myxedema, no xanthelasma; normal ears, nose and oropharynx Neck: normal jugular venous pulsations and no hepatojugular reflux; brisk carotid pulses without delay and no carotid bruits Chest: clear to auscultation, no signs of consolidation by percussion or palpation, normal fremitus, symmetrical and full respiratory excursions Cardiovascular: normal position and quality of the apical impulse, regular rhythm, normal first and second heart sounds, no murmurs, rubs or gallops Abdomen: no tenderness or distention, no masses by palpation, no abnormal pulsatility or arterial bruits, normal bowel sounds, no hepatosplenomegaly Extremities: no clubbing, cyanosis or edema; 2+ radial, ulnar  and brachial pulses bilaterally; 2+ right femoral, posterior tibial and dorsalis pedis pulses; 2+ left femoral, posterior tibial and dorsalis pedis pulses; no subclavian or femoral bruits Neurological: grossly nonfocal Psych: euthymic mood, full affect   EKG:  EKG is ordered today. The ekg ordered today demonstrates  AV sequential pacing, R wave in lead V1, QTC 573 ms   Recent Labs: 04/11/2014: ALT 17; TSH 4.222 10/27/2014: B Natriuretic Peptide 530.0*; BUN 13; Creatinine, Ser 1.29*; Hemoglobin 12.1; Platelets 225; Potassium 4.2; Sodium 137    Lipid Panel    Component Value Date/Time   CHOL 151 04/11/2014 0820   TRIG 108 04/11/2014 0820   HDL 60 04/11/2014 0820   CHOLHDL 2.5 04/11/2014 0820   VLDL 22 04/11/2014 0820   LDLCALC 69 04/11/2014 0820      Wt Readings from Last 3 Encounters:  11/07/14 156 lb (70.761 kg)  10/27/14 154 lb (69.854 kg)  10/03/14 157 lb (71.215 kg)      Other studies Reviewed: Additional studies/ records that were reviewed today include:  Emergency department records.   ASSESSMENT AND PLAN:   I think Mrs. Thorington's episode of chest pain was a  heart failure equivalent. I don't think she had a true acute coronary syndrome. Her symptoms have improved and parallel with the reduction in thoracic impedance. This had been gradually changing over several weeks and she had noticed weight change, although she had not taken additional diuretic dose.   We discussed ways to recognize the signs and symptoms of impending heart failure and how to respond to future increases in weight. She has not been taking the furosemide on a daily basis but only as needed. We set a trigger weight of 155 pounds , above which she should always take her furosemide. We also reviewed the importance of sodium restriction. No other changes are made in her medications.   it is also time to repeat liver function tests and thyroid function tests for her chronic treatment with amiodarone.  1. Chronic combined systolic and diastolic HF, NYHA class I-II,   probably mildly hypervolemic, improved acute exacerbation  2. Nonischemic cardiomyopathy with LBBB, CRT responder  3. Normal CRT-P device function  4. History of persistent AFib with CHF decompensation - maintaining normal rhythm on amiodarone and apixaban. Checked LFTs and TFTs today - repeat every 6 months.  5. Excellent control of hyperlipidemia after switching to pravastatin  Current medicines are reviewed at length with the patient today.  The patient does not have concerns regarding medicines.  The following changes have been made:   Take furosemide 20 mg weight is 155 pounds or higher. Increase to 40 mg if weight does not decrease  Labs/ tests ordered today include:  Orders Placed This Encounter  Procedures  . Implantable device check  . EKG 12-Lead     Patient Instructions  Your physician recommends that you weigh, daily, at the same time every day, and in the same amount of clothing. Please record your daily weights on the handout provided and bring it to your next appointment. IF YOUR WEIGHT IS 155 OR  GREATER TAKE THE FUROSEMIDE.  Your physician recommends that you return for lab work in: AT [facility] IF THESE TESTS WERE NOT DONE AT DR. Sherwood Gambler OR [facility] ( CMET AND TSH).  Remote monitoring is used to monitor your Pacemaker or ICD from home. This monitoring reduces the number of office visits required to check your device to one time per year. It allows [place] to  monitor the functioning of your device to ensure it is working properly. You are scheduled for a device check from home on February 07, 2014. You may send your transmission at any time that day. If you have a wireless device, the transmission will be sent automatically. After your physician reviews your transmission, you will receive a postcard with your next transmission date.  Dr. Royann Shivers recommends that you schedule a follow-up appointment in: 6 MONTHS WITH PACEMAKER CHECK (MEDTRONIC-BLUE).  Daily Weight Record It is important to weigh yourself daily. Keep this daily weight chart near your scale. Weigh yourself each morning at the same time. Weigh yourself without shoes, and wear the same amount of clothing each day. Compare today's weight to yesterday's weight. Bring this form with you to your follow-up appointments. Call your health care provider if you have concerns about your weight, including rapid weight gain or rapid weight loss. Date: ________ Weight: ____________________ Date: ________ Weight: ____________________ Date: ________ Weight: ____________________ Date: ________ Weight: ____________________ Date: ________ Weight: ____________________ Date: ________ Weight: ____________________ Date: ________ Weight: ____________________ Date: ________ Weight: ____________________ Date: ________ Weight: ____________________ Date: ________ Weight: ____________________ Date: ________ Weight: ____________________ Date: ________ Weight: ____________________ Date: ________ Weight: ____________________ Date: ________ Weight:  ____________________ Date: ________ Weight: ____________________ Date: ________ Weight: ____________________ Date: ________ Weight: ____________________ Date: ________ Weight: ____________________ Date: ________ Weight: ____________________ Date: ________ Weight: ____________________ Date: ________ Weight: ____________________ Date: ________ Weight: ____________________ Date: ________ Weight: ____________________ Date: ________ Weight: ____________________ Date: ________ Weight: ____________________ Date: ________ Weight: ____________________ Date: ________ Weight: ____________________ Date: ________ Weight: ____________________ Date: ________ Weight: ____________________ Date: ________ Weight: ____________________ Date: ________ Weight: ____________________ Date: ________ Weight: ____________________ Date: ________ Weight: ____________________ Date: ________ Weight: ____________________ Date: ________ Weight: ____________________ Date: ________ Weight: ____________________ Date: ________ Weight: ____________________ Date: ________ Weight: ____________________ Date: ________ Weight: ____________________ Date: ________ Weight: ____________________ Date: ________ Weight: ____________________ Date: ________ Weight: ____________________ Date: ________ Weight: ____________________ Date: ________ Weight: ____________________ Date: ________ Weight: ____________________ Date: ________ Weight: ____________________ Date: ________ Weight: ____________________ Date: ________ Weight: ____________________ Date: ________ Weight: ____________________ Date: ________ Weight: ____________________   This information is not intended to replace advice given to you by your health care provider. Make sure you discuss any questions you have with your health care provider.   Document Released: 03/13/2006 Document Revised: 01/20/2014 Document Reviewed: 07/29/2013 Elsevier Interactive Patient Education [address].          Signed, Thurmon Fair, MD  11/08/2014 4:16 PM    Thurmon Fair, MD, [facility] [phone] office [phone] pager

## 2014-11-16 ENCOUNTER — Encounter: Payer: Self-pay | Admitting: Cardiovascular Disease

## 2014-12-05 ENCOUNTER — Telehealth: Payer: Self-pay | Admitting: Cardiovascular Disease

## 2014-12-05 NOTE — Telephone Encounter (Signed)
Pt has used nitro for relief of chest tightness, sometimes she feels SOB and gets elevated BP along w/ this but nitro relieves this cluster of symptoms.  Advised meds as directed. Advised PCP f/u if cold symptoms. Advised on-call provider over weekend if new or worse symptoms or ER if CP does not resolve. Caller voiced understanding.

## 2014-12-05 NOTE — Telephone Encounter (Signed)
Calling because Kathleen [name] was prescribe Nitro and wants to know how often is she supposed to take one as well as she has flu like symptoms . Please call   Thanks

## 2014-12-14 ENCOUNTER — Encounter (INDEPENDENT_AMBULATORY_CARE_PROVIDER_SITE_OTHER): Payer: Self-pay | Admitting: *Deleted

## 2014-12-19 ENCOUNTER — Encounter: Payer: Medicare Other | Admitting: Cardiovascular Disease

## 2014-12-23 ENCOUNTER — Other Ambulatory Visit: Payer: Self-pay | Admitting: Cardiovascular Disease

## 2014-12-23 ENCOUNTER — Telehealth: Payer: Self-pay | Admitting: Cardiology

## 2014-12-23 NOTE — Telephone Encounter (Signed)
Pt's daughter cal pt very SOB, can't really talk due to SOB.  BP elevated and NTG helped.  No chest pain.  She could not stand on scales due to SOB. I asked them to come to ER to be evaluated.

## 2014-12-25 NOTE — Telephone Encounter (Signed)
Rx request sent to pharmacy.  

## 2015-01-03 ENCOUNTER — Encounter (INDEPENDENT_AMBULATORY_CARE_PROVIDER_SITE_OTHER): Payer: Self-pay | Admitting: Internal Medicine

## 2015-01-03 ENCOUNTER — Ambulatory Visit (INDEPENDENT_AMBULATORY_CARE_PROVIDER_SITE_OTHER): Payer: Medicare Other | Admitting: Internal Medicine

## 2015-01-03 VITALS — BP 122/62 | HR 60 | Temp 97.4°F | Ht 68.0 in | Wt 153.2 lb

## 2015-01-03 DIAGNOSIS — R110 Nausea: Secondary | ICD-10-CM | POA: Diagnosis not present

## 2015-01-03 DIAGNOSIS — K5909 Other constipation: Secondary | ICD-10-CM | POA: Diagnosis not present

## 2015-01-03 LAB — COMPREHENSIVE METABOLIC PANEL
ALK PHOS: 116 U/L (ref 33–130)
ALT: 39 U/L — AB (ref 6–29)
AST: 38 U/L — AB (ref 10–35)
Albumin: 3.4 g/dL — ABNORMAL LOW (ref 3.6–5.1)
BILIRUBIN TOTAL: 0.7 mg/dL (ref 0.2–1.2)
BUN: 14 mg/dL (ref 7–25)
CO2: 31 mmol/L (ref 20–31)
CREATININE: 1.24 mg/dL — AB (ref 0.60–0.88)
Calcium: 8.9 mg/dL (ref 8.6–10.4)
Chloride: 92 mmol/L — ABNORMAL LOW (ref 98–110)
GLUCOSE: 192 mg/dL — AB (ref 65–99)
Potassium: 3.8 mmol/L (ref 3.5–5.3)
SODIUM: 132 mmol/L — AB (ref 135–146)
TOTAL PROTEIN: 6.1 g/dL (ref 6.1–8.1)

## 2015-01-03 LAB — CBC WITH DIFFERENTIAL/PLATELET
BASOS ABS: 0 10*3/uL (ref 0.0–0.1)
BASOS PCT: 0 % (ref 0–1)
EOS ABS: 0 10*3/uL (ref 0.0–0.7)
EOS PCT: 0 % (ref 0–5)
HCT: 34.1 % — ABNORMAL LOW (ref 36.0–46.0)
Hemoglobin: 11.4 g/dL — ABNORMAL LOW (ref 12.0–15.0)
LYMPHS ABS: 1.1 10*3/uL (ref 0.7–4.0)
Lymphocytes Relative: 8 % — ABNORMAL LOW (ref 12–46)
MCH: 31.5 pg (ref 26.0–34.0)
MCHC: 33.4 g/dL (ref 30.0–36.0)
MCV: 94.2 fL (ref 78.0–100.0)
MPV: 8.4 fL — AB (ref 8.6–12.4)
Monocytes Absolute: 1 10*3/uL (ref 0.1–1.0)
Monocytes Relative: 7 % (ref 3–12)
NEUTROS PCT: 85 % — AB (ref 43–77)
Neutro Abs: 11.7 10*3/uL — ABNORMAL HIGH (ref 1.7–7.7)
PLATELETS: 322 10*3/uL (ref 150–400)
RBC: 3.62 MIL/uL — AB (ref 3.87–5.11)
RDW: 14.4 % (ref 11.5–15.5)
WBC: 13.8 10*3/uL — AB (ref 4.0–10.5)

## 2015-01-03 NOTE — Progress Notes (Addendum)
Subjective:    Patient ID: Kathleen [name], female    DOB: 1924-04-10, 81 y.o.   MRN: [id number]  HPI Referred to our office by Dr. Sherwood Gambler for nausea. She denies any dysphagia.  She says if food or drink touches her lip she will gag. She drank coffee this morning and take her pills without any problem. She cannot stand to eat gooey foods because she becomes nauseated. There has really been no vomiting with her nausea. Sometimes even the smell of food makes her nauseated. She has nausea "all the time" She tells me she has nausea when she eats sometimes. She has had nausea off and on since 2015 when she had her pacemaker inserted. She also presents with c/o constipation. She had tried Colace. She says she has to strain to have a BM. Warm prune juice helps her to have a BM.  She tells me she  Had a good BM Monday.  She says her main concern is that her stool becomes hard. She has had  constipation for quite a while. She may have lost about 7 pounds since March. Hx significant for Atrial fib and CAD and maintained on Eliquis.  Review of Systems Past Medical History  Diagnosis Date  . Hypertension   . Diabetes mellitus   . High cholesterol   . Atrial fibrillation (HCC)   . Paroxysmal a-fib (HCC) 12/13/2010  . PNA (pneumonia)   . CAD (coronary artery disease) 2006  . Acid reflux   . AV block, 2nd degree     S/p Pacemaker 03/2011  . CHF (congestive heart failure) (HCC)   . Shortness of breath   . Pacemaker 10/16/2011    medtronic  . Nonischemic cardiomyopathy (HCC)   . Chronic kidney disease (CKD), stage III (moderate)     Past Surgical History  Procedure Laterality Date  . Replacement total knee    . Bladder tact  1970's  . Pacemaker insertion  10/16/2011    Medtronic  . Cholecystectomy  1999  . Bladder tack    . Nm myocar perf wall motion  03/15/2009    Normal  . Cardiac catheterization  10/15/2011    Normal coronaries  . Cardioversion N/A 10/28/2013    Procedure: CARDIOVERSION;   Surgeon: Thurmon Fair, MD;  Location[facility] ENDOSCOPY;  Service: Cardiovascular;  Laterality: N/A;  . Permanent pacemaker insertion N/A 03/24/2011    Procedure: PERMANENT PACEMAKER INSERTION;  Surgeon: Thurmon Fair, MD;  Location: [facility] CATH LAB;  Service: Cardiovascular;  Laterality: N/A;  . Left and right heart catheterization with coronary angiogram N/A 10/15/2011    Procedure: LEFT AND RIGHT HEART CATHETERIZATION WITH CORONARY ANGIOGRAM;  Surgeon: Chrystie Nose, MD;  Location: [facility] CATH LAB;  Service: Cardiovascular;  Laterality: N/A;    Allergies  Allergen Reactions  . Coumadin [Warfarin Sodium] Other (See Comments)    Caused Patient to Bleed Out.   . Meloxicam Other (See Comments)    Unknown  . Metformin And Related Other (See Comments)    Cannot have due to kidney function  . Morphine Itching    Not in right state of mind.   . Nabumetone Nausea And Vomiting  . Sulfonamide Derivatives Hives    Current Outpatient Prescriptions on File Prior to Visit  Medication Sig Dispense Refill  . amiodarone (PACERONE) 200 MG tablet TAKE 1 TABLET BY MOUTH ONCE DAILY. 30 tablet 5  . benzonatate (TESSALON) 200 MG capsule Take 200 mg by mouth 3 (three) times daily as needed for  cough.    . Coenzyme Q10 (CO Q 10 PO) Take 1 tablet by mouth daily.    Marland Kitchen diltiazem (CARDIZEM CD) 240 MG 24 hr capsule TAKE ONE CAPSULE BY MOUTH DAILY. 30 capsule 6  . docusate sodium (COLACE) 100 MG capsule Take 100 mg by mouth 2 (two) times daily as needed for mild constipation.     Marland Kitchen ELIQUIS 2.5 MG TABS tablet TAKE ONE TABLET BY MOUTH TWICE DAILY. 60 tablet 5  . furosemide (LASIX) 40 MG tablet Take 40 mg by mouth daily. As needed    . glimepiride (AMARYL) 1 MG tablet Take 0.5 tablets (0.5 mg total) by mouth daily with breakfast. 30 tablet 1  . meclizine (ANTIVERT) 25 MG tablet Take 25 mg by mouth as needed for dizziness.    . metoprolol tartrate (LOPRESSOR) 25 MG tablet Take 25 mg by mouth 2 (two) times daily.    .  Multiple Vitamins-Minerals (CENTRUM SILVER PO) Take 1 tablet by mouth daily.    . nitroGLYCERIN (NITROSTAT) 0.4 MG SL tablet DISSOLVE 1 TABLET UNDER TONGUE EVERY 5 MINUTES UP TO 15 MIN FOR CHESTPAIN. IF NO RELIEF CALL 911. 25 tablet 4  . omeprazole (PRILOSEC) 20 MG capsule TAKE ONE CAPSULE BY MOUTH DAILY. 30 capsule 11  . ondansetron (ZOFRAN) 4 MG tablet Take 4 mg by mouth every 4 (four) hours as needed for nausea or vomiting.    . potassium chloride SA (K-DUR,KLOR-CON) 20 MEQ tablet TAKE 1 TABLET BY MOUTH ONCE DAILY. (Patient taking differently: TAKE 1 TABLET BY MOUTH ONCE DAILY. with the Fluid pill) 30 tablet 10  . pravastatin (PRAVACHOL) 40 MG tablet Take 1 tablet (40 mg total) by mouth daily. 30 tablet 8  . aspirin EC 81 MG tablet Take 81 mg by mouth daily as needed for mild pain. Reported on 01/03/2015    . [DISCONTINUED] benazepril (LOTENSIN) 40 MG tablet Take 40 mg by mouth 2 (two) times daily.     No current facility-administered medications on file prior to visit.        Objective:   Physical Exam Blood pressure 122/62, pulse 60, temperature 97.4 F (36.3 C), height 5\' 8"  (1.727 m), weight 153 lb 3.2 oz (69.491 kg). Alert and oriented. Skin warm and dry. Oral mucosa is moist.   . Sclera anicteric, conjunctivae is pink. Thyroid not enlarged. No cervical lymphadenopathy. Lungs clear. Heart regular rate and rhythm.  Abdomen is soft. Bowel sounds are positive. No hepatomegaly. No abdominal masses felt. No tenderness.  No edema to lower extremities. Patient is alert and oriented.        Assessment & Plan:  [place] ? Etiology. ? Central in etiology, Will get a CBC and  CMET. Constipation: Amitiza Samples given to patient. Daughter will let me know how she is doing. OV in 3 months.

## 2015-01-03 NOTE — Patient Instructions (Signed)
Samples of Amitza daily. CBC and CMet today

## 2015-01-05 ENCOUNTER — Other Ambulatory Visit: Payer: Self-pay | Admitting: Cardiovascular Disease

## 2015-01-31 ENCOUNTER — Emergency Department (HOSPITAL_COMMUNITY): Payer: PPO

## 2015-01-31 ENCOUNTER — Encounter (HOSPITAL_COMMUNITY): Payer: Self-pay | Admitting: Emergency Medicine

## 2015-01-31 ENCOUNTER — Emergency Department (HOSPITAL_COMMUNITY)
Admission: EM | Admit: 2015-01-31 | Discharge: 2015-01-31 | Disposition: A | Discharge status: Home / Self Care | Payer: PPO | Attending: Emergency Medicine | Admitting: Emergency Medicine

## 2015-01-31 DIAGNOSIS — S32501A Unspecified fracture of right pubis, initial encounter for closed fracture: Secondary | ICD-10-CM | POA: Insufficient documentation

## 2015-01-31 DIAGNOSIS — I129 Hypertensive chronic kidney disease with stage 1 through stage 4 chronic kidney disease, or unspecified chronic kidney disease: Secondary | ICD-10-CM | POA: Insufficient documentation

## 2015-01-31 DIAGNOSIS — E7800 Pure hypercholesterolemia, unspecified: Secondary | ICD-10-CM | POA: Insufficient documentation

## 2015-01-31 DIAGNOSIS — K219 Gastro-esophageal reflux disease without esophagitis: Secondary | ICD-10-CM | POA: Insufficient documentation

## 2015-01-31 DIAGNOSIS — Y998 Other external cause status: Secondary | ICD-10-CM | POA: Diagnosis not present

## 2015-01-31 DIAGNOSIS — W01198A Fall on same level from slipping, tripping and stumbling with subsequent striking against other object, initial encounter: Secondary | ICD-10-CM | POA: Diagnosis not present

## 2015-01-31 DIAGNOSIS — T148 Other injury of unspecified body region: Secondary | ICD-10-CM | POA: Diagnosis not present

## 2015-01-31 DIAGNOSIS — Z8701 Personal history of pneumonia (recurrent): Secondary | ICD-10-CM | POA: Diagnosis not present

## 2015-01-31 DIAGNOSIS — E119 Type 2 diabetes mellitus without complications: Secondary | ICD-10-CM | POA: Insufficient documentation

## 2015-01-31 DIAGNOSIS — I2510 Atherosclerotic heart disease of native coronary artery without angina pectoris: Secondary | ICD-10-CM | POA: Diagnosis not present

## 2015-01-31 DIAGNOSIS — Y9389 Activity, other specified: Secondary | ICD-10-CM | POA: Insufficient documentation

## 2015-01-31 DIAGNOSIS — T1490 Injury, unspecified: Secondary | ICD-10-CM | POA: Diagnosis not present

## 2015-01-31 DIAGNOSIS — S3993XA Unspecified injury of pelvis, initial encounter: Secondary | ICD-10-CM | POA: Diagnosis not present

## 2015-01-31 DIAGNOSIS — N183 Chronic kidney disease, stage 3 (moderate): Secondary | ICD-10-CM | POA: Insufficient documentation

## 2015-01-31 DIAGNOSIS — I509 Heart failure, unspecified: Secondary | ICD-10-CM | POA: Insufficient documentation

## 2015-01-31 DIAGNOSIS — I4891 Unspecified atrial fibrillation: Secondary | ICD-10-CM | POA: Diagnosis not present

## 2015-01-31 DIAGNOSIS — S32591A Other specified fracture of right pubis, initial encounter for closed fracture: Secondary | ICD-10-CM

## 2015-01-31 DIAGNOSIS — M25531 Pain in right wrist: Secondary | ICD-10-CM | POA: Diagnosis not present

## 2015-01-31 DIAGNOSIS — R51 Headache: Secondary | ICD-10-CM | POA: Diagnosis not present

## 2015-01-31 DIAGNOSIS — Y9289 Other specified places as the place of occurrence of the external cause: Secondary | ICD-10-CM | POA: Insufficient documentation

## 2015-01-31 DIAGNOSIS — Z9889 Other specified postprocedural states: Secondary | ICD-10-CM | POA: Insufficient documentation

## 2015-01-31 DIAGNOSIS — M25561 Pain in right knee: Secondary | ICD-10-CM | POA: Diagnosis not present

## 2015-01-31 MED ORDER — HYDROCODONE-ACETAMINOPHEN 5-325 MG PO TABS: 0.5000 | ORAL_TABLET | ORAL | Status: DC | PRN

## 2015-01-31 NOTE — ED Notes (Addendum)
Per EMS, pt from home. Pt reports initial fall on Saturday. Pt fell again last night. Reports losing her balance and fell on RT hip. States she hit her head on TV, but denies LOC. Pt was able to ambulate back into the bed. Pt reports waking up this morning with 10/10 pain and soreness to RT hip that increases with movement. No deformity or rotation noted. Pt has a pacemaker.

## 2015-01-31 NOTE — Discharge Instructions (Signed)
You have sustained a fracture to the right pubic ramus which is part of your pelvis. These are usually non-surgical and we use pain control. I have arranged for physical therapy to meet you in your home to try and help cope and get around. Use your walker and wheelchair as much as possible to assist as well. I have given you a prescription for pain medications to help with the pain, beware they can make you sleepy and drowsy so only use them if absolutely necessary.

## 2015-01-31 NOTE — ED Provider Notes (Signed)
CSN: [id number]     Arrival date & time 01/31/15  1113 History  By signing my name below, I, Elon Spanner, attest that this documentation has been prepared under the direction and in the presence of Marily Memos, MD. Electronically Signed: Elon Spanner, ED Scribe. 01/31/2015. 11:15 AM.    Chief Complaint  Patient presents with  . Fall   The history is provided by the patient. No language interpreter was used.   HPI Comments: Kathleen [name] is a 82 y.o. female who presents to the Emergency Department complaining of a mechanical fall that occurred last night.  The patient states her foot got caught on the floor and she fell, hitting her right hip.  She reports she awoke this morning with pain in the right hip and right thigh that is worse with ROM.  She is unable to bear weight due to pain.  Patient is takes Eliquis.  She denies LOC.  Patient also fell 3 days ago and hit her head lightly on a DVD player without LOC.  She denies CP, back pain.  Past Medical History  Diagnosis Date  . Hypertension   . Diabetes mellitus   . High cholesterol   . Atrial fibrillation (HCC)   . Paroxysmal a-fib (HCC) 12/13/2010  . PNA (pneumonia)   . CAD (coronary artery disease) 2006  . Acid reflux   . AV block, 2nd degree     S/p Pacemaker 03/2011  . CHF (congestive heart failure) (HCC)   . Shortness of breath   . Pacemaker 10/16/2011    medtronic  . Nonischemic cardiomyopathy (HCC)   . Chronic kidney disease (CKD), stage III (moderate)    Past Surgical History  Procedure Laterality Date  . Replacement total knee    . Bladder tact  1970's  . Pacemaker insertion  10/16/2011    Medtronic  . Cholecystectomy  1999  . Bladder tack    . Nm myocar perf wall motion  03/15/2009    Normal  . Cardiac catheterization  10/15/2011    Normal coronaries  . Cardioversion N/A 10/28/2013    Procedure: CARDIOVERSION;  Surgeon: Thurmon Fair, MD;  Location: [facility] ENDOSCOPY;  Service: Cardiovascular;  Laterality: N/A;  .  Permanent pacemaker insertion N/A 03/24/2011    Procedure: PERMANENT PACEMAKER INSERTION;  Surgeon: Thurmon Fair, MD;  Location: [facility] CATH LAB;  Service: Cardiovascular;  Laterality: N/A;  . Left and right heart catheterization with coronary angiogram N/A 10/15/2011    Procedure: LEFT AND RIGHT HEART CATHETERIZATION WITH CORONARY ANGIOGRAM;  Surgeon: Chrystie Nose, MD;  Location: [facility] CATH LAB;  Service: Cardiovascular;  Laterality: N/A;   Family History  Problem Relation Age of Onset  . Heart murmur Daughter   . Suicidality Mother   . Heart attack Brother    Social History  Substance Use Topics  . Smoking status: Never Smoker   . Smokeless tobacco: Never Used  . Alcohol Use: No   OB History    No data available     Review of Systems  Cardiovascular: Negative for chest pain.  Musculoskeletal: Positive for arthralgias. Negative for back pain.      Allergies  Coumadin; Meloxicam; Metformin and related; Morphine; Nabumetone; Oxycodone; and Sulfonamide derivatives  Home Medications   Prior to Admission medications   Medication Sig Start Date End Date Taking? Authorizing Provider  amiodarone (PACERONE) 200 MG tablet TAKE 1 TABLET BY MOUTH ONCE DAILY. 08/10/14  Yes Mihai Croitoru, MD  Coenzyme Q10 (CO Q 10 PO) Take  1 tablet by mouth daily.   Yes Historical Provider, MD  diltiazem (CARDIZEM CD) 240 MG 24 hr capsule TAKE ONE CAPSULE BY MOUTH DAILY. 10/11/14  Yes Mihai Croitoru, MD  docusate sodium (COLACE) 100 MG capsule Take 100 mg by mouth at bedtime.    Yes Historical Provider, MD  ELIQUIS 2.5 MG TABS tablet TAKE ONE TABLET BY MOUTH TWICE DAILY. 08/10/14  Yes Mihai Croitoru, MD  furosemide (LASIX) 40 MG tablet Take 40 mg by mouth daily as needed for fluid. As needed   Yes Historical Provider, MD  glimepiride (AMARYL) 1 MG tablet Take 0.5 tablets (0.5 mg total) by mouth daily with breakfast. Patient taking differently: Take 0.5 mg by mouth daily as needed (for blood sugar levels over  120).  10/20/13  Yes Estela Isaiah Blakes, MD  meclizine (ANTIVERT) 25 MG tablet Take 25 mg by mouth 2 (two) times daily as needed for dizziness or nausea.    Yes Historical Provider, MD  metoprolol tartrate (LOPRESSOR) 25 MG tablet TAKE ONE TABLET BY MOUTH TWICE DAILY. 01/09/15  Yes Mihai Croitoru, MD  Multiple Vitamins-Minerals (CENTRUM SILVER PO) Take 1 tablet by mouth daily.   Yes Historical Provider, MD  nitroGLYCERIN (NITROSTAT) 0.4 MG SL tablet DISSOLVE 1 TABLET UNDER TONGUE EVERY 5 MINUTES UP TO 15 MIN FOR CHESTPAIN. IF NO RELIEF CALL 911. 12/25/14  Yes Mihai Croitoru, MD  omeprazole (PRILOSEC) 20 MG capsule TAKE ONE CAPSULE BY MOUTH DAILY. 05/09/14  Yes Mihai Croitoru, MD  ondansetron (ZOFRAN) 4 MG tablet Take 4 mg by mouth every 4 (four) hours as needed for nausea or vomiting.   Yes Historical Provider, MD  potassium chloride SA (K-DUR,KLOR-CON) 20 MEQ tablet TAKE 1 TABLET BY MOUTH ONCE DAILY. Patient taking differently: TAKE 1 TABLET BY MOUTH ONCE DAILY. with the Fluid pill 05/29/14  Yes Mihai Croitoru, MD  pravastatin (PRAVACHOL) 40 MG tablet Take 1 tablet (40 mg total) by mouth daily. 07/19/14  Yes Mihai Croitoru, MD  HYDROcodone-acetaminophen (NORCO/VICODIN) 5-325 MG tablet Take 0.5-1 tablets by mouth every 4 (four) hours as needed for severe pain. 01/31/15   Barbara Cower Kyran Connaughton, MD   BP 150/72 mmHg  Pulse 70  Temp(Src) 97.5 F (36.4 C) (Oral)  Resp 15  Ht 5\' 6"  (1.676 m)  Wt 150 lb (68.04 kg)  BMI 24.22 kg/m2  SpO2 91% Physical Exam  Constitutional: She is oriented to person, place, and time. She appears well-developed and well-nourished. No distress.  HENT:  Head: Normocephalic and atraumatic.  Eyes: Conjunctivae and EOM are normal.  Neck: Neck supple. No tracheal deviation present.  Cardiovascular: Normal rate.   Pulmonary/Chest: Effort normal. No respiratory distress.  Abdominal: Soft. Bowel sounds are normal. There is no tenderness.  Abdomen benign.   Musculoskeletal: Normal  range of motion.  Pain with ROM of right leg mostly with internal rotation at the proximal femur area.  ttp of proximal femur.  Stable with table to AP and lateral compression of hips and chest.   c-,t-,l-spine nontender.  Right wrist pain.  No tendernesss.   Neurological: She is alert and oriented to person, place, and time.  Skin: Skin is warm and dry.  Psychiatric: She has a normal mood and affect. Her behavior is normal.  Nursing note and vitals reviewed.   ED Course  Procedures (including critical care time)  DIAGNOSTIC STUDIES: Oxygen Saturation is 94% on RA, normal by my interpretation.    COORDINATION OF CARE:  11:48 AM Will order imaging of wrist, head, pelvis.  Patient acknowledges and agrees with plan.    Labs Review Labs Reviewed - No data to display  Imaging Review Dg Wrist Complete Right  01/31/2015  CLINICAL DATA:  Fall last night with right wrist pain. Initial encounter. EXAM: RIGHT WRIST - COMPLETE 3+ VIEW COMPARISON:  None. FINDINGS: No displaced fractures are identified. There is subtle lucency present in the lateral aspect of the distal radial metaphysis suspicious for nondisplaced fracture. Moderate degenerative changes are seen involving the radiocarpal joint, distal ulna and radioulnar joint. No bony lesions or destruction identified. Soft tissues are unremarkable. IMPRESSION: Potential subtle nondisplaced fracture of the distal radial metaphysis. Electronically Signed   By: Irish Lack M.D.   On: 01/31/2015 12:37   Dg Knee 2 Views Right  01/31/2015  CLINICAL DATA:  Initial encounter for Fall two times in past two days. Right knee pain and right wrist pain. Prev surg to right knee. EXAM: RIGHT KNEE - 1-2 VIEW COMPARISON:  06/12/2009 FINDINGS: Total knee arthroplasty. No hardware complication. No acute fracture or dislocation. No joint effusion. IMPRESSION: No acute osseous abnormality. Electronically Signed   By: Jeronimo Greaves M.D.   On: 01/31/2015 12:33   Ct Head  Wo Contrast  01/31/2015  CLINICAL DATA:  Pain following fall four days prior EXAM: CT HEAD WITHOUT CONTRAST TECHNIQUE: Contiguous axial images were obtained from the base of the skull through the vertex without intravenous contrast. COMPARISON:  May 13, 2011 FINDINGS: There is age related volume loss. There is no intracranial mass, hemorrhage, extra-axial fluid collection, or midline shift. There is patchy small vessel disease in the centra semiovale bilaterally. Elsewhere gray-white compartments are normal. No acute infarct evident. The bony calvarium appears intact. The mastoid air cells are clear. No intraorbital lesions are appreciable. IMPRESSION: Age related volume loss with patchy periventricular small vessel disease. No intracranial mass, hemorrhage, or extra-axial fluid collection. No acute appearing infarct. Electronically Signed   By: Bretta Bang III M.D.   On: 01/31/2015 13:24   Ct Pelvis Wo Contrast  01/31/2015  CLINICAL DATA:  Fall Saturday and last night with right-sided pain. EXAM: CT PELVIS WITHOUT CONTRAST TECHNIQUE: Multidetector CT imaging of the pelvis was performed following the standard protocol without intravenous contrast. COMPARISON:  None. FINDINGS: Soft tissues: Large colonic stool burden. Scattered colonic diverticula. Normal pelvic small bowel loops. No free fluid or intraperitoneal air. No pelvic adenopathy. Bones:  No urinary bladder.  Normal uterus and adnexa. No well-defined soft tissue hematoma. Vascular calcifications. Pelvic floor laxity. Mild osteopenia. Degenerative irregularity involving the lateral right side of the sacrum. Femoral heads are located. Acute, mildly comminuted right inferior pubic ramus fracture. IMPRESSION: Minimally comminuted right inferior pubic ramus fracture. Electronically Signed   By: Jeronimo Greaves M.D.   On: 01/31/2015 13:26   I have personally reviewed and evaluated these images and lab results as part of my medical decision-making.    EKG Interpretation None      MDM   Final diagnoses:  Pubic ramus fracture, right, closed, initial encounter [facility])    82 yo F w/ mechanical fall with resulting right inferior pubic rami fracture. xr wrist also questioned subtle fracture however no pain over that area. Will follow up for repeat xr in a week to further eval. Pain meds given, will use walker and w/c at home. Also asked case management to consult to help with obtaining home PT.   I personally performed the services described in this documentation, which was scribed in my presence. The recorded information  has been reviewed and is accurate.    Marily Memos, MD 01/31/15 [phone]

## 2015-01-31 NOTE — ED Notes (Signed)
MD Mesner at bedside  °

## 2015-01-31 NOTE — Care Management (Signed)
Spoke with patient and family. Patient is from home with daughter and grand daughter. Patient has DME at home [name] and cane. Dr Clayborne Dana would like home PT. Patient given choice and requested [facility] for services. Referral placed with [facility].

## 2015-02-02 DIAGNOSIS — S32501D Unspecified fracture of right pubis, subsequent encounter for fracture with routine healing: Secondary | ICD-10-CM | POA: Diagnosis not present

## 2015-02-02 DIAGNOSIS — I2510 Atherosclerotic heart disease of native coronary artery without angina pectoris: Secondary | ICD-10-CM | POA: Diagnosis not present

## 2015-02-02 DIAGNOSIS — Z950 Presence of cardiac pacemaker: Secondary | ICD-10-CM | POA: Diagnosis not present

## 2015-02-02 DIAGNOSIS — Z951 Presence of aortocoronary bypass graft: Secondary | ICD-10-CM | POA: Diagnosis not present

## 2015-02-02 DIAGNOSIS — I130 Hypertensive heart and chronic kidney disease with heart failure and stage 1 through stage 4 chronic kidney disease, or unspecified chronic kidney disease: Secondary | ICD-10-CM | POA: Diagnosis not present

## 2015-02-02 DIAGNOSIS — E1122 Type 2 diabetes mellitus with diabetic chronic kidney disease: Secondary | ICD-10-CM | POA: Diagnosis not present

## 2015-02-02 DIAGNOSIS — K219 Gastro-esophageal reflux disease without esophagitis: Secondary | ICD-10-CM | POA: Diagnosis not present

## 2015-02-02 DIAGNOSIS — I480 Paroxysmal atrial fibrillation: Secondary | ICD-10-CM | POA: Diagnosis not present

## 2015-02-02 DIAGNOSIS — Z7984 Long term (current) use of oral hypoglycemic drugs: Secondary | ICD-10-CM | POA: Diagnosis not present

## 2015-02-02 DIAGNOSIS — Z7901 Long term (current) use of anticoagulants: Secondary | ICD-10-CM | POA: Diagnosis not present

## 2015-02-02 DIAGNOSIS — I509 Heart failure, unspecified: Secondary | ICD-10-CM | POA: Diagnosis not present

## 2015-02-02 DIAGNOSIS — Z9181 History of falling: Secondary | ICD-10-CM | POA: Diagnosis not present

## 2015-02-02 DIAGNOSIS — N183 Chronic kidney disease, stage 3 (moderate): Secondary | ICD-10-CM | POA: Diagnosis not present

## 2015-02-05 ENCOUNTER — Telehealth: Payer: Self-pay | Admitting: Cardiovascular Disease

## 2015-02-05 NOTE — Telephone Encounter (Signed)
Spoke with pt dtr, they have called dr fusco's office 3 to 4 times and no one returns the call. They were given a script for the wheel chair but not the walker. They wonder if dr croitoru will sign Will forward for dr croitoru's review.

## 2015-02-05 NOTE — Telephone Encounter (Signed)
This will be faxed to [facility] at [phone]

## 2015-02-05 NOTE — Telephone Encounter (Signed)
Please call,pt fell and broke her pelvic and right wrist. She needs a prescription for a walker with a platform please.

## 2015-02-05 NOTE — Telephone Encounter (Signed)
OK to Rx walker with platform, please

## 2015-02-06 ENCOUNTER — Ambulatory Visit (INDEPENDENT_AMBULATORY_CARE_PROVIDER_SITE_OTHER): Payer: PPO | Admitting: *Deleted

## 2015-02-06 ENCOUNTER — Telehealth: Payer: Self-pay | Admitting: Cardiovascular Disease

## 2015-02-06 ENCOUNTER — Telehealth: Payer: Self-pay | Admitting: Cardiology

## 2015-02-06 DIAGNOSIS — I442 Atrioventricular block, complete: Secondary | ICD-10-CM

## 2015-02-06 DIAGNOSIS — R269 Unspecified abnormalities of gait and mobility: Secondary | ICD-10-CM

## 2015-02-06 NOTE — Telephone Encounter (Signed)
Pt's daughter called in stating that [facility] did not receive the fax about Dr. Salena Saner approving for her to have a walker. Please re-fax and inform daughter  That the fax has been resent.   Thanks

## 2015-02-06 NOTE — Telephone Encounter (Signed)
Spoke with pt dtr, order re-faxed

## 2015-02-06 NOTE — Progress Notes (Signed)
02/06/2015 A.Mikyle Sox RNCM 2002pm  [facility] received phone call from [facility] physical therapist from [facility] requesting weight bearing status from patient's right wrist.  [facility] sent inbox message to Dr. Clayborne Dana with above request.  [facility] called and spoke to [facility] [phone] and asked if patient has an orthopedic follow up appointment scheduled and to discuss weight bearing status with orthopedic doctor.  Ben verbalized understanding, thankful for services.  No further EDCM needs at this time.

## 2015-02-06 NOTE — Telephone Encounter (Signed)
Spoke with pt and reminded pt of remote transmission that is due today. Pt verbalized understanding.   

## 2015-02-07 NOTE — Progress Notes (Signed)
Remote pacemaker transmission.   

## 2015-02-08 ENCOUNTER — Other Ambulatory Visit: Payer: Self-pay | Admitting: Cardiovascular Disease

## 2015-02-09 ENCOUNTER — Telehealth: Payer: Self-pay | Admitting: *Deleted

## 2015-02-09 NOTE — Telephone Encounter (Signed)
Rx request sent to pharmacy.  

## 2015-02-09 NOTE — Telephone Encounter (Signed)
Request came in for a rolling walker with a right platform.  Called and spoke with [name] (daughter).  The therapist that is working with her mother was able to order the walker she needs due to a wrist injury.  Message left for Daria at [facility] to let her know this has been taken care of .

## 2015-02-13 DIAGNOSIS — I2510 Atherosclerotic heart disease of native coronary artery without angina pectoris: Secondary | ICD-10-CM | POA: Diagnosis not present

## 2015-02-13 DIAGNOSIS — Z950 Presence of cardiac pacemaker: Secondary | ICD-10-CM | POA: Diagnosis not present

## 2015-02-13 DIAGNOSIS — N183 Chronic kidney disease, stage 3 (moderate): Secondary | ICD-10-CM | POA: Diagnosis not present

## 2015-02-13 DIAGNOSIS — I509 Heart failure, unspecified: Secondary | ICD-10-CM | POA: Diagnosis not present

## 2015-02-13 DIAGNOSIS — Z7984 Long term (current) use of oral hypoglycemic drugs: Secondary | ICD-10-CM | POA: Diagnosis not present

## 2015-02-13 DIAGNOSIS — Z9181 History of falling: Secondary | ICD-10-CM | POA: Diagnosis not present

## 2015-02-13 DIAGNOSIS — K219 Gastro-esophageal reflux disease without esophagitis: Secondary | ICD-10-CM | POA: Diagnosis not present

## 2015-02-13 DIAGNOSIS — I130 Hypertensive heart and chronic kidney disease with heart failure and stage 1 through stage 4 chronic kidney disease, or unspecified chronic kidney disease: Secondary | ICD-10-CM | POA: Diagnosis not present

## 2015-02-13 DIAGNOSIS — I480 Paroxysmal atrial fibrillation: Secondary | ICD-10-CM | POA: Diagnosis not present

## 2015-02-13 DIAGNOSIS — Z951 Presence of aortocoronary bypass graft: Secondary | ICD-10-CM | POA: Diagnosis not present

## 2015-02-13 DIAGNOSIS — Z7901 Long term (current) use of anticoagulants: Secondary | ICD-10-CM | POA: Diagnosis not present

## 2015-02-13 DIAGNOSIS — S32501D Unspecified fracture of right pubis, subsequent encounter for fracture with routine healing: Secondary | ICD-10-CM | POA: Diagnosis not present

## 2015-02-13 DIAGNOSIS — E1122 Type 2 diabetes mellitus with diabetic chronic kidney disease: Secondary | ICD-10-CM | POA: Diagnosis not present

## 2015-02-14 DIAGNOSIS — Z1389 Encounter for screening for other disorder: Secondary | ICD-10-CM | POA: Diagnosis not present

## 2015-02-14 DIAGNOSIS — N183 Chronic kidney disease, stage 3 (moderate): Secondary | ICD-10-CM | POA: Diagnosis not present

## 2015-02-14 DIAGNOSIS — E1129 Type 2 diabetes mellitus with other diabetic kidney complication: Secondary | ICD-10-CM | POA: Diagnosis not present

## 2015-02-14 DIAGNOSIS — Z6823 Body mass index (BMI) 23.0-23.9, adult: Secondary | ICD-10-CM | POA: Diagnosis not present

## 2015-02-14 DIAGNOSIS — J449 Chronic obstructive pulmonary disease, unspecified: Secondary | ICD-10-CM | POA: Diagnosis not present

## 2015-02-15 ENCOUNTER — Telehealth: Payer: Self-pay | Admitting: *Deleted

## 2015-02-15 DIAGNOSIS — Z950 Presence of cardiac pacemaker: Secondary | ICD-10-CM | POA: Diagnosis not present

## 2015-02-15 DIAGNOSIS — Z7984 Long term (current) use of oral hypoglycemic drugs: Secondary | ICD-10-CM | POA: Diagnosis not present

## 2015-02-15 DIAGNOSIS — N183 Chronic kidney disease, stage 3 (moderate): Secondary | ICD-10-CM | POA: Diagnosis not present

## 2015-02-15 DIAGNOSIS — Z951 Presence of aortocoronary bypass graft: Secondary | ICD-10-CM | POA: Diagnosis not present

## 2015-02-15 DIAGNOSIS — S32501D Unspecified fracture of right pubis, subsequent encounter for fracture with routine healing: Secondary | ICD-10-CM | POA: Diagnosis not present

## 2015-02-15 DIAGNOSIS — I509 Heart failure, unspecified: Secondary | ICD-10-CM | POA: Diagnosis not present

## 2015-02-15 DIAGNOSIS — I130 Hypertensive heart and chronic kidney disease with heart failure and stage 1 through stage 4 chronic kidney disease, or unspecified chronic kidney disease: Secondary | ICD-10-CM | POA: Diagnosis not present

## 2015-02-15 DIAGNOSIS — I2510 Atherosclerotic heart disease of native coronary artery without angina pectoris: Secondary | ICD-10-CM | POA: Diagnosis not present

## 2015-02-15 DIAGNOSIS — I480 Paroxysmal atrial fibrillation: Secondary | ICD-10-CM | POA: Diagnosis not present

## 2015-02-15 DIAGNOSIS — Z7901 Long term (current) use of anticoagulants: Secondary | ICD-10-CM | POA: Diagnosis not present

## 2015-02-15 DIAGNOSIS — Z9181 History of falling: Secondary | ICD-10-CM | POA: Diagnosis not present

## 2015-02-15 DIAGNOSIS — K219 Gastro-esophageal reflux disease without esophagitis: Secondary | ICD-10-CM | POA: Diagnosis not present

## 2015-02-15 DIAGNOSIS — E1122 Type 2 diabetes mellitus with diabetic chronic kidney disease: Secondary | ICD-10-CM | POA: Diagnosis not present

## 2015-02-15 NOTE — Telephone Encounter (Signed)
Patient's daughter Ms Lourdes Sledge called for the patient to schedule her an appointment, patient was seen 01/31/15 at [facility] ER for problem of pelvic fracture. She had a CT done 01/31/15. Patient was to have another xray per ER note within a week however patient said she has been unable to get through to our office until today 02/15/15. Can xray be done here in the office or does it need to be done at [facility]. Please review and advise [phone]

## 2015-02-15 NOTE — Telephone Encounter (Signed)
Patient is scheduled for Monday. Patient aware.

## 2015-02-15 NOTE — Telephone Encounter (Signed)
Schedule visit, xray in office

## 2015-02-19 ENCOUNTER — Encounter: Payer: Self-pay | Admitting: Orthopedic Surgery

## 2015-02-19 ENCOUNTER — Ambulatory Visit (INDEPENDENT_AMBULATORY_CARE_PROVIDER_SITE_OTHER): Payer: PPO | Admitting: Orthopedic Surgery

## 2015-02-19 VITALS — BP 138/71 | HR 72 | Ht 66.0 in | Wt 153.0 lb

## 2015-02-19 DIAGNOSIS — S329XXA Fracture of unspecified parts of lumbosacral spine and pelvis, initial encounter for closed fracture: Secondary | ICD-10-CM | POA: Diagnosis not present

## 2015-02-19 DIAGNOSIS — M93839 Other specified osteochondropathies, unspecified forearm: Secondary | ICD-10-CM | POA: Insufficient documentation

## 2015-02-19 DIAGNOSIS — S52501A Unspecified fracture of the lower end of right radius, initial encounter for closed fracture: Secondary | ICD-10-CM

## 2015-02-19 NOTE — Progress Notes (Signed)
New injury   Chief Complaint  Patient presents with  . Hospitalization Follow-up    Pelvic and rt wrist fx   HPI Comments: Pain right wrist  Mild Intermittent Ache X 23 days   Pain right groin Mild With weight bearing Ache Intermittent    Post inj day 23   FOV TODAY 02-19-15  Review of systems neurologic normal no numbness or tingling. Musculoskeletal swelling in the right wrist  Past Medical History  Diagnosis Date  . Hypertension   . Diabetes mellitus   . High cholesterol   . Atrial fibrillation (HCC)   . Paroxysmal a-fib (HCC) 12/13/2010  . PNA (pneumonia)   . CAD (coronary artery disease) 2006  . Acid reflux   . AV block, 2nd degree     S/p Pacemaker 03/2011  . CHF (congestive heart failure) (HCC)   . Shortness of breath   . Pacemaker 10/16/2011    medtronic  . Nonischemic cardiomyopathy (HCC)   . Chronic kidney disease (CKD), stage III (moderate)    Past Surgical History  Procedure Laterality Date  . Replacement total knee    . Bladder tact  1970's  . Pacemaker insertion  10/16/2011    Medtronic  . Cholecystectomy  1999  . Bladder tack    . Nm myocar perf wall motion  03/15/2009    Normal  . Cardiac catheterization  10/15/2011    Normal coronaries  . Cardioversion N/A 10/28/2013    Procedure: CARDIOVERSION;  Surgeon: Thurmon Fair, MD;  Location: [facility] ENDOSCOPY;  Service: Cardiovascular;  Laterality: N/A;  . Permanent pacemaker insertion N/A 03/24/2011    Procedure: PERMANENT PACEMAKER INSERTION;  Surgeon: Thurmon Fair, MD;  Location: [facility] CATH LAB;  Service: Cardiovascular;  Laterality: N/A;  . Left and right heart catheterization with coronary angiogram N/A 10/15/2011    Procedure: LEFT AND RIGHT HEART CATHETERIZATION WITH CORONARY ANGIOGRAM;  Surgeon: Chrystie Nose, MD;  Location: [facility] CATH LAB;  Service: Cardiovascular;  Laterality: N/A;   BP 138/71 mmHg  Pulse 72  Ht 5\' 6"  (1.676 m)  Wt 153 lb (69.4 kg)  BMI 24.71 kg/m2  Normal development grooming  hygiene. Patient awake alert and oriented 3 Mood and affect are normal  Amer process patient is a mature with a walker with a platform. She also has a wrist splint on the right.  Pelvis is stable to compression and external rotation forces. Bilateral hip range of motion equal normal with mild groin pain on the right none on the left Alignment of the limbs are normal with normal leg lengths Muscle tone and strength are normal Hip stability normal and push pull test  Right wrist and left wrist comparison. Right wrist has tenderness and swelling at the distal radius none on the left No gross deformity in either wrist mild swelling on the right wrist. Wrists are stable. Strength and grip is normal.  Imaging from the hospital includes AP lateral oblique right wrist and pelvic x-ray with CT scan  Small minimally displaced right pelvic fracture and nondisplaced right wrist fracture and a previously fractured wrist with shortening of the radius from the previous fracture  Plan weightbearing as tolerated with platform walker. Splint right wrist.  X-rays pelvis and x-rays 3 views right wrist in 3 weeks.

## 2015-02-20 DIAGNOSIS — S32501D Unspecified fracture of right pubis, subsequent encounter for fracture with routine healing: Secondary | ICD-10-CM | POA: Diagnosis not present

## 2015-02-20 DIAGNOSIS — Z7984 Long term (current) use of oral hypoglycemic drugs: Secondary | ICD-10-CM | POA: Diagnosis not present

## 2015-02-20 DIAGNOSIS — K219 Gastro-esophageal reflux disease without esophagitis: Secondary | ICD-10-CM | POA: Diagnosis not present

## 2015-02-20 DIAGNOSIS — I130 Hypertensive heart and chronic kidney disease with heart failure and stage 1 through stage 4 chronic kidney disease, or unspecified chronic kidney disease: Secondary | ICD-10-CM | POA: Diagnosis not present

## 2015-02-20 DIAGNOSIS — Z9181 History of falling: Secondary | ICD-10-CM | POA: Diagnosis not present

## 2015-02-20 DIAGNOSIS — Z950 Presence of cardiac pacemaker: Secondary | ICD-10-CM | POA: Diagnosis not present

## 2015-02-20 DIAGNOSIS — E1122 Type 2 diabetes mellitus with diabetic chronic kidney disease: Secondary | ICD-10-CM | POA: Diagnosis not present

## 2015-02-20 DIAGNOSIS — I2510 Atherosclerotic heart disease of native coronary artery without angina pectoris: Secondary | ICD-10-CM | POA: Diagnosis not present

## 2015-02-20 DIAGNOSIS — Z951 Presence of aortocoronary bypass graft: Secondary | ICD-10-CM | POA: Diagnosis not present

## 2015-02-20 DIAGNOSIS — I480 Paroxysmal atrial fibrillation: Secondary | ICD-10-CM | POA: Diagnosis not present

## 2015-02-20 DIAGNOSIS — N183 Chronic kidney disease, stage 3 (moderate): Secondary | ICD-10-CM | POA: Diagnosis not present

## 2015-02-20 DIAGNOSIS — I509 Heart failure, unspecified: Secondary | ICD-10-CM | POA: Diagnosis not present

## 2015-02-20 DIAGNOSIS — Z7901 Long term (current) use of anticoagulants: Secondary | ICD-10-CM | POA: Diagnosis not present

## 2015-02-20 LAB — CUP PACEART REMOTE DEVICE CHECK
Battery Voltage: 3 V
Brady Statistic AP VP Percent: 99.7 %
Brady Statistic AS VP Percent: 0.29 %
Brady Statistic AS VS Percent: 0 %
Brady Statistic RA Percent Paced: 99.71 %
Brady Statistic RV Percent Paced: 99.99 %
Implantable Lead Implant Date: 20130311
Implantable Lead Implant Date: 20131003
Implantable Lead Location: 753858
Implantable Lead Location: 753859
Implantable Lead Model: 5086
Lead Channel Impedance Value: 304 Ohm
Lead Channel Impedance Value: 342 Ohm
Lead Channel Impedance Value: 437 Ohm
Lead Channel Impedance Value: 494 Ohm
Lead Channel Impedance Value: 513 Ohm
Lead Channel Impedance Value: 646 Ohm
Lead Channel Impedance Value: 646 Ohm
Lead Channel Pacing Threshold Amplitude: 0.875 V
Lead Channel Pacing Threshold Pulse Width: 0.4 ms
Lead Channel Pacing Threshold Pulse Width: 0.4 ms
Lead Channel Sensing Intrinsic Amplitude: 1.875 mV
Lead Channel Sensing Intrinsic Amplitude: 1.875 mV
Lead Channel Setting Pacing Amplitude: 2 V
Lead Channel Setting Pacing Amplitude: 2.75 V
Lead Channel Setting Pacing Pulse Width: 0.4 ms
MDC IDC LEAD IMPLANT DT: 20130311
MDC IDC LEAD LOCATION: 753860
MDC IDC LEAD MODEL: 4194
MDC IDC LEAD MODEL: 5086
MDC IDC MSMT BATTERY REMAINING LONGEVITY: 50 mo
MDC IDC MSMT LEADCHNL LV PACING THRESHOLD AMPLITUDE: 1 V
MDC IDC MSMT LEADCHNL LV PACING THRESHOLD PULSEWIDTH: 0.4 ms
MDC IDC MSMT LEADCHNL RA IMPEDANCE VALUE: 437 Ohm
MDC IDC MSMT LEADCHNL RV IMPEDANCE VALUE: 380 Ohm
MDC IDC MSMT LEADCHNL RV PACING THRESHOLD AMPLITUDE: 1.125 V
MDC IDC MSMT LEADCHNL RV SENSING INTR AMPL: 22.625 mV
MDC IDC MSMT LEADCHNL RV SENSING INTR AMPL: 22.625 mV
MDC IDC SESS DTM: 20170125060408
MDC IDC SET LEADCHNL LV PACING AMPLITUDE: 2 V
MDC IDC SET LEADCHNL LV PACING PULSEWIDTH: 0.4 ms
MDC IDC SET LEADCHNL RV SENSING SENSITIVITY: 4 mV
MDC IDC STAT BRADY AP VS PERCENT: 0.01 %

## 2015-02-21 ENCOUNTER — Encounter: Payer: Self-pay | Admitting: Cardiology

## 2015-02-22 ENCOUNTER — Telehealth (INDEPENDENT_AMBULATORY_CARE_PROVIDER_SITE_OTHER): Payer: Self-pay | Admitting: Internal Medicine

## 2015-02-22 NOTE — Telephone Encounter (Signed)
[name], the pt's daughter, called saying she needs a refill of Amitiza sent to her pharmacy [facility]). She's completely out according to [place]. Please give her a call if necessary.  Pt's ph# [phone] Thank you.

## 2015-02-23 ENCOUNTER — Encounter: Payer: Self-pay | Admitting: Cardiology

## 2015-02-23 ENCOUNTER — Telehealth (INDEPENDENT_AMBULATORY_CARE_PROVIDER_SITE_OTHER): Payer: Self-pay | Admitting: Internal Medicine

## 2015-02-23 DIAGNOSIS — I130 Hypertensive heart and chronic kidney disease with heart failure and stage 1 through stage 4 chronic kidney disease, or unspecified chronic kidney disease: Secondary | ICD-10-CM | POA: Diagnosis not present

## 2015-02-23 DIAGNOSIS — I509 Heart failure, unspecified: Secondary | ICD-10-CM | POA: Diagnosis not present

## 2015-02-23 DIAGNOSIS — K219 Gastro-esophageal reflux disease without esophagitis: Secondary | ICD-10-CM | POA: Diagnosis not present

## 2015-02-23 DIAGNOSIS — Z7901 Long term (current) use of anticoagulants: Secondary | ICD-10-CM | POA: Diagnosis not present

## 2015-02-23 DIAGNOSIS — Z9181 History of falling: Secondary | ICD-10-CM | POA: Diagnosis not present

## 2015-02-23 DIAGNOSIS — Z951 Presence of aortocoronary bypass graft: Secondary | ICD-10-CM | POA: Diagnosis not present

## 2015-02-23 DIAGNOSIS — N183 Chronic kidney disease, stage 3 (moderate): Secondary | ICD-10-CM | POA: Diagnosis not present

## 2015-02-23 DIAGNOSIS — S32501D Unspecified fracture of right pubis, subsequent encounter for fracture with routine healing: Secondary | ICD-10-CM | POA: Diagnosis not present

## 2015-02-23 DIAGNOSIS — Z950 Presence of cardiac pacemaker: Secondary | ICD-10-CM | POA: Diagnosis not present

## 2015-02-23 DIAGNOSIS — I480 Paroxysmal atrial fibrillation: Secondary | ICD-10-CM | POA: Diagnosis not present

## 2015-02-23 DIAGNOSIS — Z7984 Long term (current) use of oral hypoglycemic drugs: Secondary | ICD-10-CM | POA: Diagnosis not present

## 2015-02-23 DIAGNOSIS — E1122 Type 2 diabetes mellitus with diabetic chronic kidney disease: Secondary | ICD-10-CM | POA: Diagnosis not present

## 2015-02-23 DIAGNOSIS — I2510 Atherosclerotic heart disease of native coronary artery without angina pectoris: Secondary | ICD-10-CM | POA: Diagnosis not present

## 2015-02-23 NOTE — Telephone Encounter (Signed)
Ms. Kathleen [name] left a message saying she needs [place] to send another fax to [facility] for her Amitiza. Please give her a phone call if needed.  Pt's ph# [phone] Thank you.

## 2015-02-26 ENCOUNTER — Telehealth (INDEPENDENT_AMBULATORY_CARE_PROVIDER_SITE_OTHER): Payer: Self-pay | Admitting: Internal Medicine

## 2015-02-26 MED ORDER — LUBIPROSTONE 8 MCG PO CAPS: 8.0000 ug | ORAL_CAPSULE | Freq: Two times a day (BID) | ORAL | Status: DC

## 2015-02-26 NOTE — Telephone Encounter (Signed)
addressed

## 2015-02-26 NOTE — Telephone Encounter (Signed)
Rx refilled.

## 2015-02-26 NOTE — Telephone Encounter (Signed)
Rx sent ot her pharmacy

## 2015-02-27 NOTE — Telephone Encounter (Signed)
error 

## 2015-03-06 DIAGNOSIS — H31012 Macula scars of posterior pole (postinflammatory) (post-traumatic), left eye: Secondary | ICD-10-CM | POA: Diagnosis not present

## 2015-03-06 DIAGNOSIS — H353111 Nonexudative age-related macular degeneration, right eye, early dry stage: Secondary | ICD-10-CM | POA: Diagnosis not present

## 2015-03-06 DIAGNOSIS — E119 Type 2 diabetes mellitus without complications: Secondary | ICD-10-CM | POA: Diagnosis not present

## 2015-03-06 DIAGNOSIS — E1129 Type 2 diabetes mellitus with other diabetic kidney complication: Secondary | ICD-10-CM | POA: Diagnosis not present

## 2015-03-06 DIAGNOSIS — Z961 Presence of intraocular lens: Secondary | ICD-10-CM | POA: Diagnosis not present

## 2015-03-07 ENCOUNTER — Encounter: Payer: Self-pay | Admitting: Cardiology

## 2015-03-09 ENCOUNTER — Encounter: Payer: Self-pay | Admitting: Cardiology

## 2015-03-12 ENCOUNTER — Ambulatory Visit (INDEPENDENT_AMBULATORY_CARE_PROVIDER_SITE_OTHER): Payer: PPO

## 2015-03-12 ENCOUNTER — Ambulatory Visit (INDEPENDENT_AMBULATORY_CARE_PROVIDER_SITE_OTHER): Payer: Self-pay | Admitting: Orthopedic Surgery

## 2015-03-12 VITALS — BP 113/61 | Ht 66.0 in | Wt 153.0 lb

## 2015-03-12 DIAGNOSIS — S329XXD Fracture of unspecified parts of lumbosacral spine and pelvis, subsequent encounter for fracture with routine healing: Secondary | ICD-10-CM

## 2015-03-12 DIAGNOSIS — S62101D Fracture of unspecified carpal bone, right wrist, subsequent encounter for fracture with routine healing: Secondary | ICD-10-CM | POA: Diagnosis not present

## 2015-03-12 NOTE — Progress Notes (Signed)
Patient ID: ABUK DELVAL, female   DOB: 05-19-1924, 82 y.o.   MRN: [id number]  Chief Complaint  Patient presents with  . Follow-up    3 week recheck on right wrist fracture and pelvic fracture with xrays, DOI 01-30-15.    BP 113/61 mmHg  Ht 5\' 6"  (1.676 m)  Wt 153 lb (69.4 kg)  BMI 24.71 kg/m2  6 weeks follow-up status post pelvic fracture on the right and right wrist fracture  X-ray show fracture wrist and pelvis healed  Exam: No tenderness of the wrist and her hip motion is normal her leg lengths are equal    ASSESSMENT AND PLAN   Released remove walker and brace

## 2015-03-13 DIAGNOSIS — E119 Type 2 diabetes mellitus without complications: Secondary | ICD-10-CM | POA: Diagnosis not present

## 2015-03-13 DIAGNOSIS — H35071 Retinal telangiectasis, right eye: Secondary | ICD-10-CM | POA: Diagnosis not present

## 2015-03-13 DIAGNOSIS — H31019 Macula scars of posterior pole (postinflammatory) (post-traumatic), unspecified eye: Secondary | ICD-10-CM | POA: Diagnosis not present

## 2015-03-13 DIAGNOSIS — H353222 Exudative age-related macular degeneration, left eye, with inactive choroidal neovascularization: Secondary | ICD-10-CM | POA: Diagnosis not present

## 2015-03-13 DIAGNOSIS — H353132 Nonexudative age-related macular degeneration, bilateral, intermediate dry stage: Secondary | ICD-10-CM | POA: Diagnosis not present

## 2015-04-03 ENCOUNTER — Ambulatory Visit (INDEPENDENT_AMBULATORY_CARE_PROVIDER_SITE_OTHER): Payer: Medicare Other | Admitting: Internal Medicine

## 2015-04-04 ENCOUNTER — Ambulatory Visit (INDEPENDENT_AMBULATORY_CARE_PROVIDER_SITE_OTHER): Payer: PPO | Admitting: Internal Medicine

## 2015-04-04 ENCOUNTER — Encounter (INDEPENDENT_AMBULATORY_CARE_PROVIDER_SITE_OTHER): Payer: Self-pay | Admitting: Internal Medicine

## 2015-04-04 VITALS — BP 122/72 | HR 76 | Temp 97.6°F | Ht 68.0 in | Wt 152.8 lb

## 2015-04-04 DIAGNOSIS — K5909 Other constipation: Secondary | ICD-10-CM | POA: Diagnosis not present

## 2015-04-04 DIAGNOSIS — R110 Nausea: Secondary | ICD-10-CM | POA: Diagnosis not present

## 2015-04-04 NOTE — Progress Notes (Signed)
Subjective:    Patient ID: Kathleen [name], female    DOB: 1924-06-22, 82 y.o.   MRN: [id number]  HPIHere today for f/u. She was last seen in December with nausea but no vomiting.  Weight 155 in January. Today her weight is 152.8. She tells me she is having a BM daily or every other day since starting the Amitza. No melena or BRRB. Her appetite is good. She has lost 2 pounds since her last visit.  She is keeping her food down. She occasionally has some nausea. She usually takes a nausea pill maybe once a week. She thinks that if she takes the nausea pill she will eat her breakfast. She is eating 3 melas a day. She has had nausea off and on since she had the pacemaker inserted. No acid reflux.  Hx significant for atrial fib and CAD and maintained on Eliquis. Hx of pacemaker.    Recent pelvic fx in January while dancing. She is doing well now and has no problems walking.  Review of Systems Past Medical History  Diagnosis Date  . Hypertension   . Diabetes mellitus   . High cholesterol   . Atrial fibrillation (HCC)   . Paroxysmal a-fib (HCC) 12/13/2010  . PNA (pneumonia)   . CAD (coronary artery disease) 2006  . Acid reflux   . AV block, 2nd degree     S/p Pacemaker 03/2011  . CHF (congestive heart failure) (HCC)   . Shortness of breath   . Pacemaker 10/16/2011    medtronic  . Nonischemic cardiomyopathy (HCC)   . Chronic kidney disease (CKD), stage III (moderate)     Past Surgical History  Procedure Laterality Date  . Replacement total knee    . Bladder tact  1970's  . Pacemaker insertion  10/16/2011    Medtronic  . Cholecystectomy  1999  . Bladder tack    . Nm myocar perf wall motion  03/15/2009    Normal  . Cardiac catheterization  10/15/2011    Normal coronaries  . Cardioversion N/A 10/28/2013    Procedure: CARDIOVERSION;  Surgeon: Thurmon Fair, MD;  Location: [facility] ENDOSCOPY;  Service: Cardiovascular;  Laterality: N/A;  . Permanent pacemaker insertion N/A 03/24/2011   Procedure: PERMANENT PACEMAKER INSERTION;  Surgeon: Thurmon Fair, MD;  Location: [facility] CATH LAB;  Service: Cardiovascular;  Laterality: N/A;  . Left and right heart catheterization with coronary angiogram N/A 10/15/2011    Procedure: LEFT AND RIGHT HEART CATHETERIZATION WITH CORONARY ANGIOGRAM;  Surgeon: Chrystie Nose, MD;  Location: [facility] CATH LAB;  Service: Cardiovascular;  Laterality: N/A;    Allergies  Allergen Reactions  . Coumadin [Warfarin Sodium] Other (See Comments)    Caused Patient to Bleed Out.   . Meloxicam Other (See Comments)    Unknown  . Metformin And Related Other (See Comments)    Cannot have due to kidney function  . Morphine Itching    Not in right state of mind.   . Nabumetone Nausea And Vomiting  . Oxycodone Nausea And Vomiting  . Sulfonamide Derivatives Hives    Current Outpatient Prescriptions on File Prior to Visit  Medication Sig Dispense Refill  . amiodarone (PACERONE) 200 MG tablet TAKE 1 TABLET BY MOUTH ONCE DAILY. 30 tablet 3  . Coenzyme Q10 (CO Q 10 PO) Take 1 tablet by mouth daily.    Marland Kitchen diltiazem (CARDIZEM CD) 240 MG 24 hr capsule TAKE ONE CAPSULE BY MOUTH DAILY. 30 capsule 6  . docusate sodium (COLACE) 100 MG  capsule Take 100 mg by mouth at bedtime.     Marland Kitchen ELIQUIS 2.5 MG TABS tablet TAKE ONE TABLET BY MOUTH TWICE DAILY. 60 tablet 3  . furosemide (LASIX) 40 MG tablet Take 40 mg by mouth daily as needed for fluid. As needed    . glimepiride (AMARYL) 1 MG tablet Take 0.5 tablets (0.5 mg total) by mouth daily with breakfast. (Patient taking differently: Take 0.5 mg by mouth daily as needed (for blood sugar levels over 120). ) 30 tablet 1  . HYDROcodone-acetaminophen (NORCO/VICODIN) 5-325 MG tablet Take 0.5-1 tablets by mouth every 4 (four) hours as needed for severe pain. 15 tablet 0  . lubiprostone (AMITIZA) 8 MCG capsule Take 1 capsule (8 mcg total) by mouth 2 (two) times daily. 60 capsule 5  . meclizine (ANTIVERT) 25 MG tablet Take 25 mg by mouth 2 (two)  times daily as needed for dizziness or nausea.     . metoprolol tartrate (LOPRESSOR) 25 MG tablet TAKE ONE TABLET BY MOUTH TWICE DAILY. 60 tablet 5  . Multiple Vitamins-Minerals (CENTRUM SILVER PO) Take 1 tablet by mouth daily.    . nitroGLYCERIN (NITROSTAT) 0.4 MG SL tablet DISSOLVE 1 TABLET UNDER TONGUE EVERY 5 MINUTES UP TO 15 MIN FOR CHESTPAIN. IF NO RELIEF CALL 911. 25 tablet 4  . omeprazole (PRILOSEC) 20 MG capsule TAKE ONE CAPSULE BY MOUTH DAILY. 30 capsule 11  . ondansetron (ZOFRAN) 4 MG tablet Take 4 mg by mouth every 4 (four) hours as needed for nausea or vomiting.    . potassium chloride SA (K-DUR,KLOR-CON) 20 MEQ tablet TAKE 1 TABLET BY MOUTH ONCE DAILY. (Patient taking differently: TAKE 1 TABLET BY MOUTH ONCE DAILY. with the Fluid pill) 30 tablet 10  . pravastatin (PRAVACHOL) 40 MG tablet Take 1 tablet (40 mg total) by mouth daily. 30 tablet 8  . [DISCONTINUED] benazepril (LOTENSIN) 40 MG tablet Take 40 mg by mouth 2 (two) times daily.     No current facility-administered medications on file prior to visit.        Objective:   Physical ExamBlood pressure 122/72, pulse 76, temperature 97.6 F (36.4 C), height 5\' 8"  (1.727 m), weight 152 lb 12.8 oz (69.31 kg).  Alert and oriented. Skin warm and dry. Oral mucosa is moist.   . Sclera anicteric, conjunctivae is pink. Thyroid not enlarged. No cervical lymphadenopathy. Lungs clear. Heart regular rate and rhythm.  Abdomen is soft. Bowel sounds are positive. No hepatomegaly. No abdominal masses felt. No tenderness.  No edema to lower extremities.         Assessment & Plan:  Nausea which is much better. Has nausea about once a week and takes Zofran as needed. She feels good.  Constipation: Having a BM daily or every other day now with Amitiza. OV in 6 months.

## 2015-04-04 NOTE — Patient Instructions (Signed)
OV in 6 months. 

## 2015-04-09 ENCOUNTER — Other Ambulatory Visit: Payer: Self-pay | Admitting: Cardiovascular Disease

## 2015-04-30 ENCOUNTER — Inpatient Hospital Stay (HOSPITAL_COMMUNITY)
Admission: EM | Admit: 2015-04-30 | Discharge: 2015-05-04 | DRG: 291 | Disposition: A | Discharge status: Home Health | Payer: PPO | Attending: Internal Medicine | Admitting: Internal Medicine

## 2015-04-30 ENCOUNTER — Encounter (HOSPITAL_COMMUNITY): Payer: Self-pay | Admitting: Emergency Medicine

## 2015-04-30 ENCOUNTER — Emergency Department (HOSPITAL_COMMUNITY): Payer: PPO

## 2015-04-30 DIAGNOSIS — E7800 Pure hypercholesterolemia, unspecified: Secondary | ICD-10-CM | POA: Diagnosis not present

## 2015-04-30 DIAGNOSIS — I429 Cardiomyopathy, unspecified: Secondary | ICD-10-CM | POA: Diagnosis not present

## 2015-04-30 DIAGNOSIS — T502X5A Adverse effect of carbonic-anhydrase inhibitors, benzothiadiazides and other diuretics, initial encounter: Secondary | ICD-10-CM | POA: Diagnosis not present

## 2015-04-30 DIAGNOSIS — Z950 Presence of cardiac pacemaker: Secondary | ICD-10-CM | POA: Diagnosis not present

## 2015-04-30 DIAGNOSIS — J90 Pleural effusion, not elsewhere classified: Secondary | ICD-10-CM | POA: Diagnosis not present

## 2015-04-30 DIAGNOSIS — I2510 Atherosclerotic heart disease of native coronary artery without angina pectoris: Secondary | ICD-10-CM | POA: Diagnosis present

## 2015-04-30 DIAGNOSIS — N183 Chronic kidney disease, stage 3 unspecified: Secondary | ICD-10-CM | POA: Diagnosis present

## 2015-04-30 DIAGNOSIS — I5043 Acute on chronic combined systolic (congestive) and diastolic (congestive) heart failure: Secondary | ICD-10-CM | POA: Diagnosis not present

## 2015-04-30 DIAGNOSIS — I5033 Acute on chronic diastolic (congestive) heart failure: Secondary | ICD-10-CM | POA: Diagnosis not present

## 2015-04-30 DIAGNOSIS — R0602 Shortness of breath: Secondary | ICD-10-CM

## 2015-04-30 DIAGNOSIS — E1122 Type 2 diabetes mellitus with diabetic chronic kidney disease: Secondary | ICD-10-CM | POA: Diagnosis present

## 2015-04-30 DIAGNOSIS — I480 Paroxysmal atrial fibrillation: Secondary | ICD-10-CM | POA: Diagnosis present

## 2015-04-30 DIAGNOSIS — R079 Chest pain, unspecified: Secondary | ICD-10-CM

## 2015-04-30 DIAGNOSIS — Z7901 Long term (current) use of anticoagulants: Secondary | ICD-10-CM

## 2015-04-30 DIAGNOSIS — I130 Hypertensive heart and chronic kidney disease with heart failure and stage 1 through stage 4 chronic kidney disease, or unspecified chronic kidney disease: Principal | ICD-10-CM | POA: Diagnosis present

## 2015-04-30 DIAGNOSIS — I5042 Chronic combined systolic (congestive) and diastolic (congestive) heart failure: Secondary | ICD-10-CM | POA: Diagnosis not present

## 2015-04-30 DIAGNOSIS — E876 Hypokalemia: Secondary | ICD-10-CM | POA: Diagnosis not present

## 2015-04-30 DIAGNOSIS — I10 Essential (primary) hypertension: Secondary | ICD-10-CM | POA: Diagnosis present

## 2015-04-30 DIAGNOSIS — R0789 Other chest pain: Secondary | ICD-10-CM | POA: Diagnosis not present

## 2015-04-30 DIAGNOSIS — R05 Cough: Secondary | ICD-10-CM | POA: Diagnosis not present

## 2015-04-30 DIAGNOSIS — E119 Type 2 diabetes mellitus without complications: Secondary | ICD-10-CM

## 2015-04-30 HISTORY — DX: Paroxysmal atrial fibrillation: I48.0

## 2015-04-30 HISTORY — DX: Personal history of pneumonia (recurrent): Z87.01

## 2015-04-30 HISTORY — DX: Essential (primary) hypertension: I10

## 2015-04-30 HISTORY — DX: Type 2 diabetes mellitus without complications: E11.9

## 2015-04-30 HISTORY — DX: Hyperlipidemia, unspecified: E78.5

## 2015-04-30 LAB — URINALYSIS, ROUTINE W REFLEX MICROSCOPIC
BILIRUBIN URINE: NEGATIVE
GLUCOSE, UA: NEGATIVE mg/dL
HGB URINE DIPSTICK: NEGATIVE
Ketones, ur: NEGATIVE mg/dL
Leukocytes, UA: NEGATIVE
Nitrite: NEGATIVE
Protein, ur: NEGATIVE mg/dL
SPECIFIC GRAVITY, URINE: 1.015 (ref 1.005–1.030)
pH: 6 (ref 5.0–8.0)

## 2015-04-30 LAB — CBC
HCT: 36.7 % (ref 36.0–46.0)
Hemoglobin: 12.3 g/dL (ref 12.0–15.0)
MCH: 32.7 pg (ref 26.0–34.0)
MCHC: 33.5 g/dL (ref 30.0–36.0)
MCV: 97.6 fL (ref 78.0–100.0)
PLATELETS: 263 10*3/uL (ref 150–400)
RBC: 3.76 MIL/uL — ABNORMAL LOW (ref 3.87–5.11)
RDW: 14.5 % (ref 11.5–15.5)
WBC: 8.5 10*3/uL (ref 4.0–10.5)

## 2015-04-30 LAB — BASIC METABOLIC PANEL
ANION GAP: 9 (ref 5–15)
BUN: 13 mg/dL (ref 6–20)
CALCIUM: 8.4 mg/dL — AB (ref 8.9–10.3)
CHLORIDE: 96 mmol/L — AB (ref 101–111)
CO2: 26 mmol/L (ref 22–32)
CREATININE: 1.43 mg/dL — AB (ref 0.44–1.00)
GFR calc non Af Amer: 32 mL/min — ABNORMAL LOW (ref >60)
GFR, EST AFRICAN AMERICAN: 37 mL/min — AB (ref >60)
Glucose, Bld: 209 mg/dL — ABNORMAL HIGH (ref 65–99)
POTASSIUM: 3.3 mmol/L — AB (ref 3.5–5.1)
SODIUM: 131 mmol/L — AB (ref 135–145)

## 2015-04-30 LAB — TROPONIN I

## 2015-04-30 LAB — BRAIN NATRIURETIC PEPTIDE: B Natriuretic Peptide: 589 pg/mL — ABNORMAL HIGH (ref 0.0–100.0)

## 2015-04-30 MED ORDER — POTASSIUM CHLORIDE CRYS ER 20 MEQ PO TBCR
20.0000 meq | EXTENDED_RELEASE_TABLET | Freq: Every day | ORAL | Status: DC
  Filled 2015-04-30: qty 1

## 2015-04-30 MED ORDER — FUROSEMIDE 40 MG PO TABS: 40.0000 mg | ORAL_TABLET | Freq: Every day | ORAL | Status: DC | PRN

## 2015-04-30 MED ORDER — HYDROCODONE-ACETAMINOPHEN 5-325 MG PO TABS: 0.5000 | ORAL_TABLET | ORAL | Status: DC | PRN

## 2015-04-30 MED ORDER — ONDANSETRON HCL 4 MG/2ML IJ SOLN
4.0000 mg | Freq: Four times a day (QID) | INTRAMUSCULAR | Status: DC | PRN
  Administered 2015-05-01 – 2015-05-04 (×4): 4 mg via INTRAVENOUS
  Filled 2015-04-30 (×4): qty 2

## 2015-04-30 MED ORDER — PRAVASTATIN SODIUM 40 MG PO TABS
40.0000 mg | ORAL_TABLET | Freq: Every day | ORAL | Status: DC
  Administered 2015-05-01 – 2015-05-04 (×4): 40 mg via ORAL
  Filled 2015-04-30 (×4): qty 1

## 2015-04-30 MED ORDER — ACETAMINOPHEN 325 MG PO TABS: 650.0000 mg | ORAL_TABLET | ORAL | Status: DC | PRN

## 2015-04-30 MED ORDER — AMIODARONE HCL 200 MG PO TABS
200.0000 mg | ORAL_TABLET | Freq: Every day | ORAL | Status: DC
  Administered 2015-05-01 – 2015-05-04 (×4): 200 mg via ORAL
  Filled 2015-04-30 (×4): qty 1

## 2015-04-30 MED ORDER — APIXABAN 5 MG PO TABS
2.5000 mg | ORAL_TABLET | Freq: Two times a day (BID) | ORAL | Status: DC
  Administered 2015-05-01 – 2015-05-04 (×7): 2.5 mg via ORAL
  Filled 2015-04-30 (×7): qty 1

## 2015-04-30 MED ORDER — DILTIAZEM HCL ER COATED BEADS 240 MG PO CP24
240.0000 mg | ORAL_CAPSULE | Freq: Every day | ORAL | Status: DC
  Administered 2015-05-01: 240 mg via ORAL
  Filled 2015-04-30: qty 1

## 2015-04-30 MED ORDER — METOPROLOL TARTRATE 25 MG PO TABS
25.0000 mg | ORAL_TABLET | Freq: Two times a day (BID) | ORAL | Status: DC
  Administered 2015-05-01 (×2): 25 mg via ORAL
  Filled 2015-04-30 (×2): qty 1

## 2015-04-30 NOTE — ED Notes (Signed)
Patient complaining of upper mid chest pain radiating into bilateral jaws starting approximately 2 hours ago. States she took 2 nitro with no relief prior to arrival to ED.

## 2015-04-30 NOTE — ED Notes (Signed)
Report given to Billie Ruddy RN on 300

## 2015-04-30 NOTE — ED Provider Notes (Addendum)
CSN: [id number]     Arrival date & time 04/30/15  2024 History   First MD Initiated Contact with Patient 04/30/15 2034     Chief Complaint  Patient presents with  . Chest Pain     Patient is a 82 y.o. female presenting with chest pain. The history is provided by the patient.  Chest Pain Associated symptoms: shortness of breath   Associated symptoms: no abdominal pain and no back pain    patient presents with chest pain. Began around 2 hours prior to arrival. It is dull in the upper chest and goes to her neck. History of paroxysmal A. fib and secondary block with a pacemaker. History of CHF and has ejection fraction of 30%. History of nonischemic cardiac myopathy. States she's been doing somewhat worse the last few days. States she's just been a little fatigue. Began to have chest pain this evening but also reportedly took a nitroglycerin this morning. States the second nitroglycerin this evening with the pain. She does have swelling on her legs. States she is on fluid pills and had to take an extra fluid pill yesterday for increased swelling. Mild shortness of breath. Occasional cough  Past Medical History  Diagnosis Date  . Hypertension   . Diabetes mellitus   . High cholesterol   . Atrial fibrillation (HCC)   . Paroxysmal a-fib (HCC) 12/13/2010  . PNA (pneumonia)   . CAD (coronary artery disease) 2006  . Acid reflux   . AV block, 2nd degree     S/p Pacemaker 03/2011  . CHF (congestive heart failure) (HCC)   . Shortness of breath   . Pacemaker 10/16/2011    medtronic  . Nonischemic cardiomyopathy (HCC)   . Chronic kidney disease (CKD), stage III (moderate)    Past Surgical History  Procedure Laterality Date  . Replacement total knee    . Bladder tact  1970's  . Pacemaker insertion  10/16/2011    Medtronic  . Cholecystectomy  1999  . Bladder tack    . Nm myocar perf wall motion  03/15/2009    Normal  . Cardiac catheterization  10/15/2011    Normal coronaries  . Cardioversion  N/A 10/28/2013    Procedure: CARDIOVERSION;  Surgeon: Thurmon Fair, MD;  Location: [facility] ENDOSCOPY;  Service: Cardiovascular;  Laterality: N/A;  . Permanent pacemaker insertion N/A 03/24/2011    Procedure: PERMANENT PACEMAKER INSERTION;  Surgeon: Thurmon Fair, MD;  Location: [facility] CATH LAB;  Service: Cardiovascular;  Laterality: N/A;  . Left and right heart catheterization with coronary angiogram N/A 10/15/2011    Procedure: LEFT AND RIGHT HEART CATHETERIZATION WITH CORONARY ANGIOGRAM;  Surgeon: Chrystie Nose, MD;  Location: [facility] CATH LAB;  Service: Cardiovascular;  Laterality: N/A;   Family History  Problem Relation Age of Onset  . Heart murmur Daughter   . Suicidality Mother   . Heart attack Brother    Social History  Substance Use Topics  . Smoking status: Never Smoker   . Smokeless tobacco: Never Used  . Alcohol Use: No   OB History    No data available     Review of Systems  Constitutional: Negative for appetite change.  Respiratory: Positive for shortness of breath.   Cardiovascular: Positive for chest pain and leg swelling.  Gastrointestinal: Negative for abdominal pain.  Genitourinary: Positive for frequency. Negative for dysuria.  Musculoskeletal: Negative for back pain.  Skin: Negative for wound.      Allergies  Coumadin; Meloxicam; Metformin and related; Morphine; Nabumetone; Oxycodone;  and Sulfonamide derivatives  Home Medications   Prior to Admission medications   Medication Sig Start Date End Date Taking? Authorizing Provider  amiodarone (PACERONE) 200 MG tablet TAKE 1 TABLET BY MOUTH ONCE DAILY. 02/09/15  Yes Mihai Croitoru, MD  Coenzyme Q10 (CO Q 10 PO) Take 1 tablet by mouth daily.   Yes Historical Provider, MD  diltiazem (CARDIZEM CD) 240 MG 24 hr capsule TAKE ONE CAPSULE BY MOUTH DAILY. 04/09/15  Yes Mihai Croitoru, MD  ELIQUIS 2.5 MG TABS tablet TAKE ONE TABLET BY MOUTH TWICE DAILY. 02/09/15  Yes Mihai Croitoru, MD  fluocinonide-emollient (LIDEX-E) 0.05 %  cream Apply 1 application topically daily as needed. For skin irritation 04/16/15  Yes Historical Provider, MD  furosemide (LASIX) 40 MG tablet Take 40 mg by mouth daily as needed for fluid.    Yes Historical Provider, MD  glimepiride (AMARYL) 1 MG tablet Take 0.5 tablets (0.5 mg total) by mouth daily with breakfast. Patient taking differently: Take 0.5 mg by mouth daily as needed (for blood sugar levels over 120).  10/20/13  Yes Estela Isaiah Blakes, MD  HYDROcodone-acetaminophen (NORCO/VICODIN) 5-325 MG tablet Take 0.5-1 tablets by mouth every 4 (four) hours as needed for severe pain. 01/31/15  Yes Marily Memos, MD  lubiprostone (AMITIZA) 8 MCG capsule Take 1 capsule (8 mcg total) by mouth 2 (two) times daily. 02/26/15  Yes Len Blalock, NP  meclizine (ANTIVERT) 25 MG tablet Take 25 mg by mouth 2 (two) times daily as needed for dizziness or nausea.    Yes Historical Provider, MD  metoprolol tartrate (LOPRESSOR) 25 MG tablet TAKE ONE TABLET BY MOUTH TWICE DAILY. 01/09/15  Yes Mihai Croitoru, MD  Multiple Vitamins-Minerals (CENTRUM SILVER PO) Take 1 tablet by mouth every morning.    Yes Historical Provider, MD  Multiple Vitamins-Minerals (ICAPS AREDS 2 PO) Take 1 capsule by mouth 2 (two) times daily.    Yes Historical Provider, MD  nitroGLYCERIN (NITROSTAT) 0.4 MG SL tablet DISSOLVE 1 TABLET UNDER TONGUE EVERY 5 MINUTES UP TO 15 MIN FOR CHESTPAIN. IF NO RELIEF CALL 911. 12/25/14  Yes Mihai Croitoru, MD  omeprazole (PRILOSEC) 20 MG capsule TAKE ONE CAPSULE BY MOUTH DAILY. 05/09/14  Yes Mihai Croitoru, MD  ondansetron (ZOFRAN) 4 MG tablet Take 4 mg by mouth every 4 (four) hours as needed for nausea or vomiting.   Yes Historical Provider, MD  potassium chloride SA (K-DUR,KLOR-CON) 20 MEQ tablet TAKE 1 TABLET BY MOUTH ONCE DAILY. Patient taking differently: TAKE 1 TABLET BY MOUTH ONCE DAILY. with the Fluid pill 05/29/14  Yes Mihai Croitoru, MD  pravastatin (PRAVACHOL) 40 MG tablet Take 1 tablet (40 mg  total) by mouth daily. 07/19/14  Yes Mihai Croitoru, MD   BP 131/53 mmHg  Pulse 74  Temp(Src) 98 F (36.7 C) (Oral)  Resp 20  Wt 146 lb (66.225 kg)  SpO2 96% Physical Exam  Constitutional: She appears well-developed.  HENT:  Head: Atraumatic.  Neck: Neck supple.  Cardiovascular: Normal rate.   Pulmonary/Chest: Effort normal.  Few scattered rales  Abdominal: There is no tenderness.  Musculoskeletal: She exhibits edema.  Moderate edema to bilateral lower extremities.  Skin: Skin is warm.    ED Course  Procedures (including critical care time) Labs Review Labs Reviewed  BASIC METABOLIC PANEL - Abnormal; Notable for the following:    Sodium 131 (*)    Potassium 3.3 (*)    Chloride 96 (*)    Glucose, Bld 209 (*)    Creatinine, Ser  1.43 (*)    Calcium 8.4 (*)    GFR calc non Af Amer 32 (*)    GFR calc Af Amer 37 (*)    All other components within normal limits  CBC - Abnormal; Notable for the following:    RBC 3.76 (*)    All other components within normal limits  BRAIN NATRIURETIC PEPTIDE - Abnormal; Notable for the following:    B Natriuretic Peptide 589.0 (*)    All other components within normal limits  TROPONIN I  URINALYSIS, ROUTINE W REFLEX MICROSCOPIC (NOT AT [facility])    Imaging Review No results found. I have personally reviewed and evaluated these images and lab results as part of my medical decision-making.   EKG Interpretation   Date/Time:  Monday April 30 2015 20:34:23 EDT Ventricular Rate:  83 PR Interval:    QRS Duration: 168 QT Interval:  516 QTC Calculation: 606 R Axis:   -87 Text Interpretation:  AV dual-paced rhythm Abnormal ECG Confirmed by  Rubin Payor  MD, Harrold Donath [phone]) on 04/30/2015 8:38:40 PM      MDM   Final diagnoses:  Chest pain, unspecified chest pain type    Patient with chest pain. Upper chest and goes to neck. Also some shortness of breath. Believed with nitroglycerin. Has paced rhythm. Around 4 years ago had reassuring heart  cath. If no other cause found will likely require admission.    Benjiman Core, MD 04/30/15 1914  Benjiman Core, MD 04/30/15 2221

## 2015-05-01 ENCOUNTER — Observation Stay (HOSPITAL_BASED_OUTPATIENT_CLINIC_OR_DEPARTMENT_OTHER): Payer: PPO

## 2015-05-01 ENCOUNTER — Encounter (HOSPITAL_COMMUNITY): Payer: Self-pay

## 2015-05-01 DIAGNOSIS — I5042 Chronic combined systolic (congestive) and diastolic (congestive) heart failure: Secondary | ICD-10-CM

## 2015-05-01 DIAGNOSIS — R0789 Other chest pain: Secondary | ICD-10-CM | POA: Diagnosis not present

## 2015-05-01 DIAGNOSIS — I429 Cardiomyopathy, unspecified: Secondary | ICD-10-CM

## 2015-05-01 DIAGNOSIS — I5033 Acute on chronic diastolic (congestive) heart failure: Secondary | ICD-10-CM | POA: Diagnosis not present

## 2015-05-01 DIAGNOSIS — R079 Chest pain, unspecified: Secondary | ICD-10-CM | POA: Diagnosis not present

## 2015-05-01 DIAGNOSIS — I480 Paroxysmal atrial fibrillation: Secondary | ICD-10-CM | POA: Diagnosis not present

## 2015-05-01 DIAGNOSIS — I5043 Acute on chronic combined systolic (congestive) and diastolic (congestive) heart failure: Secondary | ICD-10-CM | POA: Diagnosis not present

## 2015-05-01 DIAGNOSIS — Z950 Presence of cardiac pacemaker: Secondary | ICD-10-CM

## 2015-05-01 DIAGNOSIS — N183 Chronic kidney disease, stage 3 (moderate): Secondary | ICD-10-CM | POA: Diagnosis not present

## 2015-05-01 LAB — TROPONIN I
Troponin I: <0.03 ng/mL (ref <0.031)
Troponin I: <0.03 ng/mL (ref <0.031)
Troponin I: <0.03 ng/mL (ref <0.031)

## 2015-05-01 LAB — ECHOCARDIOGRAM COMPLETE
HEIGHTINCHES: 68 in
WEIGHTICAEL: 2342.4 [oz_av]

## 2015-05-01 MED ORDER — SODIUM CHLORIDE 0.9% FLUSH
3.0000 mL | Freq: Two times a day (BID) | INTRAVENOUS | Status: DC
  Administered 2015-05-01 – 2015-05-04 (×7): 3 mL via INTRAVENOUS

## 2015-05-01 MED ORDER — SODIUM CHLORIDE 0.9% FLUSH: 3.0000 mL | INTRAVENOUS | Status: DC | PRN

## 2015-05-01 MED ORDER — SODIUM CHLORIDE 0.9 % IV SOLN: 250.0000 mL | INTRAVENOUS | Status: DC | PRN

## 2015-05-01 MED ORDER — POTASSIUM CHLORIDE 20 MEQ PO PACK
40.0000 meq | PACK | Freq: Two times a day (BID) | ORAL | Status: AC
  Administered 2015-05-01 (×2): 40 meq via ORAL
  Filled 2015-05-01 (×2): qty 2

## 2015-05-01 MED ORDER — POTASSIUM CHLORIDE 20 MEQ PO PACK
20.0000 meq | PACK | Freq: Every day | ORAL | Status: DC
  Administered 2015-05-02 – 2015-05-04 (×3): 20 meq via ORAL
  Filled 2015-05-01 (×3): qty 1

## 2015-05-01 MED ORDER — ENSURE ENLIVE PO LIQD
237.0000 mL | Freq: Two times a day (BID) | ORAL | Status: DC
  Administered 2015-05-01 – 2015-05-04 (×8): 237 mL via ORAL

## 2015-05-01 MED ORDER — FUROSEMIDE 10 MG/ML IJ SOLN
40.0000 mg | Freq: Every day | INTRAMUSCULAR | Status: DC
  Administered 2015-05-01: 40 mg via INTRAVENOUS
  Filled 2015-05-01: qty 4

## 2015-05-01 NOTE — Consult Note (Signed)
   [facility] CM Inpatient Consult   05/01/2015  Kathleen [name] Jun 10, 1924 [id number]  Spoke with patient and her daughter, [name], at bedside regarding [facility] services. Patient does not wish to participate with [facility] at this time. Patient given [facility] brochure and contact information for future reference.  Of note, [facility] Care Management services would not replace or interfere with any services that are arranged by inpatient case management or social work. For additional questions or referrals please contact:  Alben Spittle. Albertha Ghee, RN, BSN, [facility]  [facility] Liaison [phone]

## 2015-05-01 NOTE — H&P (Signed)
PCP:   [name]., MD   Chief Complaint:  Chest pain  HPI: 82 yo female h/o NICM, afib, CKD, CAD, pacemaker placed for AV block 2nd degree comes in with episode of sscp that last for over 2 hours and radiated to her jaw that she says was relieved by "relaxing" in the ED.  She did receive several doses of ntg sublingual thru the day.  Pt is now cp free.  She reports associated sob with it.  She denies fevers.  No cough.  No weight gain.  Pt is poor historian but family member is present and also adds that she has been having these "episodes" several times a month for many months.  Pt did have a heart cath about 4 years ago which did not show any significant CAD.   Pt referred for admission for her chest pain.  Review of Systems:  Positive and negative as per HPI otherwise all other systems are negative  Past Medical History: Past Medical History  Diagnosis Date  . Hypertension   . Diabetes mellitus   . High cholesterol   . Atrial fibrillation (HCC)   . Paroxysmal a-fib (HCC) 12/13/2010  . PNA (pneumonia)   . CAD (coronary artery disease) 2006  . Acid reflux   . AV block, 2nd degree     S/p Pacemaker 03/2011  . CHF (congestive heart failure) (HCC)   . Shortness of breath   . Pacemaker 10/16/2011    medtronic  . Nonischemic cardiomyopathy (HCC)   . Chronic kidney disease (CKD), stage III (moderate)    Past Surgical History  Procedure Laterality Date  . Replacement total knee    . Bladder tact  1970's  . Pacemaker insertion  10/16/2011    Medtronic  . Cholecystectomy  1999  . Bladder tack    . Nm myocar perf wall motion  03/15/2009    Normal  . Cardiac catheterization  10/15/2011    Normal coronaries  . Cardioversion N/A 10/28/2013    Procedure: CARDIOVERSION;  Surgeon: Thurmon Fair, MD;  Location: [facility] ENDOSCOPY;  Service: Cardiovascular;  Laterality: N/A;  . Permanent pacemaker insertion N/A 03/24/2011    Procedure: PERMANENT PACEMAKER INSERTION;  Surgeon: Thurmon Fair, MD;  Location: [facility] CATH LAB;  Service: Cardiovascular;  Laterality: N/A;  . Left and right heart catheterization with coronary angiogram N/A 10/15/2011    Procedure: LEFT AND RIGHT HEART CATHETERIZATION WITH CORONARY ANGIOGRAM;  Surgeon: Chrystie Nose, MD;  Location: [facility] CATH LAB;  Service: Cardiovascular;  Laterality: N/A;    Medications: Prior to Admission medications   Medication Sig Start Date End Date Taking? Authorizing Provider  amiodarone (PACERONE) 200 MG tablet TAKE 1 TABLET BY MOUTH ONCE DAILY. 02/09/15  Yes Mihai Croitoru, MD  Coenzyme Q10 (CO Q 10 PO) Take 1 tablet by mouth daily.   Yes Historical Provider, MD  diltiazem (CARDIZEM CD) 240 MG 24 hr capsule TAKE ONE CAPSULE BY MOUTH DAILY. 04/09/15  Yes Mihai Croitoru, MD  ELIQUIS 2.5 MG TABS tablet TAKE ONE TABLET BY MOUTH TWICE DAILY. 02/09/15  Yes Mihai Croitoru, MD  fluocinonide-emollient (LIDEX-E) 0.05 % cream Apply 1 application topically daily as needed. For skin irritation 04/16/15  Yes Historical Provider, MD  furosemide (LASIX) 40 MG tablet Take 40 mg by mouth daily as needed for fluid.    Yes Historical Provider, MD  glimepiride (AMARYL) 1 MG tablet Take 0.5 tablets (0.5 mg total) by mouth daily with breakfast. Patient taking differently: Take 0.5 mg by mouth  daily as needed (for blood sugar levels over 120).  10/20/13  Yes Estela Isaiah Blakes, MD  HYDROcodone-acetaminophen (NORCO/VICODIN) 5-325 MG tablet Take 0.5-1 tablets by mouth every 4 (four) hours as needed for severe pain. 01/31/15  Yes Marily Memos, MD  lubiprostone (AMITIZA) 8 MCG capsule Take 1 capsule (8 mcg total) by mouth 2 (two) times daily. 02/26/15  Yes Len Blalock, NP  meclizine (ANTIVERT) 25 MG tablet Take 25 mg by mouth 2 (two) times daily as needed for dizziness or nausea.    Yes Historical Provider, MD  metoprolol tartrate (LOPRESSOR) 25 MG tablet TAKE ONE TABLET BY MOUTH TWICE DAILY. 01/09/15  Yes Mihai Croitoru, MD  Multiple  Vitamins-Minerals (CENTRUM SILVER PO) Take 1 tablet by mouth every morning.    Yes Historical Provider, MD  Multiple Vitamins-Minerals (ICAPS AREDS 2 PO) Take 1 capsule by mouth 2 (two) times daily.    Yes Historical Provider, MD  nitroGLYCERIN (NITROSTAT) 0.4 MG SL tablet DISSOLVE 1 TABLET UNDER TONGUE EVERY 5 MINUTES UP TO 15 MIN FOR CHESTPAIN. IF NO RELIEF CALL 911. 12/25/14  Yes Mihai Croitoru, MD  omeprazole (PRILOSEC) 20 MG capsule TAKE ONE CAPSULE BY MOUTH DAILY. 05/09/14  Yes Mihai Croitoru, MD  ondansetron (ZOFRAN) 4 MG tablet Take 4 mg by mouth every 4 (four) hours as needed for nausea or vomiting.   Yes Historical Provider, MD  potassium chloride SA (K-DUR,KLOR-CON) 20 MEQ tablet TAKE 1 TABLET BY MOUTH ONCE DAILY. Patient taking differently: TAKE 1 TABLET BY MOUTH ONCE DAILY. with the Fluid pill 05/29/14  Yes Mihai Croitoru, MD  pravastatin (PRAVACHOL) 40 MG tablet Take 1 tablet (40 mg total) by mouth daily. 07/19/14  Yes Thurmon Fair, MD    Allergies:   Allergies  Allergen Reactions  . Coumadin [Warfarin Sodium] Other (See Comments)    Caused Patient to Bleed Out.   . Meloxicam Other (See Comments)    Unknown  . Metformin And Related Other (See Comments)    Cannot have due to kidney function  . Morphine Itching    Not in right state of mind.   . Nabumetone Nausea And Vomiting  . Oxycodone Nausea And Vomiting  . Sulfonamide Derivatives Hives    Social History:  reports that she has never smoked. She has never used smokeless tobacco. She reports that she does not drink alcohol or use illicit drugs.  Family History: Family History  Problem Relation Age of Onset  . Heart murmur Daughter   . Suicidality Mother   . Heart attack Brother     Physical Exam: Filed Vitals:   04/30/15 2028 04/30/15 2232 04/30/15 2300 04/30/15 2330  BP: 131/53 144/83 148/73 149/72  Pulse: 74 70 70 69  Temp: 98 F (36.7 C)     TempSrc: Oral     Resp: 20 18 21 17   Weight: 66.225 kg (146 lb)      SpO2: 96% 94% 93% 93%   General appearance: alert, cooperative and no distress Head: Normocephalic, without obvious abnormality, atraumatic Eyes: negative Nose: Nares normal. Septum midline. Mucosa normal. No drainage or sinus tenderness. Neck: no JVD and supple, symmetrical, trachea midline Lungs: clear to auscultation bilaterally Heart: regular rate and rhythm, S1, S2 normal, no murmur, click, rub or gallop Abdomen: soft, non-tender; bowel sounds normal; no masses,  no organomegaly Extremities: extremities normal, atraumatic, no cyanosis or edema Pulses: 2+ and symmetric Skin: Skin color, texture, turgor normal. No rashes or lesions Neurologic: Grossly normal    Labs on  Admission:   Recent Labs  04/30/15 2050  NA 131*  K 3.3*  CL 96*  CO2 26  GLUCOSE 209*  BUN 13  CREATININE 1.43*  CALCIUM 8.4*    Recent Labs  04/30/15 2050  WBC 8.5  HGB 12.3  HCT 36.7  MCV 97.6  PLT 263    Recent Labs  04/30/15 2050  TROPONINI <0.03   Radiological Exams on Admission: Dg Chest 2 View  04/30/2015  CLINICAL DATA:  Upper chest pain radiating to jaw EXAM: CHEST  2 VIEW COMPARISON:  10/27/2014 FINDINGS: Chronic interstitial markings/emphysematous changes. Biapical pleural-parenchymal scarring. Small bilateral pleural effusions.  No pneumothorax. Cardiomegaly.  Left subclavian pacemaker. Mild degenerative changes of the visualized thoracolumbar spine. IMPRESSION: Small bilateral pleural effusions. Chronic interstitial markings/emphysematous changes. Electronically Signed   By: Charline Bills M.D.   On: 04/30/2015 22:29   ekg reveiwed paced  Assessment/Plan  82 yo female with several risk factors with chest pain  Principal Problem:   Chest pain- atypical.  Serial troponin.  Cardiac echo in am.  Will obtain cardiology consult for recommendations of further work up.    Active Problems:   Hypertension- noted   DM type 2 (diabetes mellitus, type 2) (HCC)- place on ssi    Paroxysmal a-fib (HCC)- s/p pacemaker   PPM-Medtronic-    Chronic combined systolic and diastolic CHF (congestive heart failure) (HCC)- pt euvolemic and compensated at this time   CKD (chronic kidney disease) stage 3, GFR 30-59 ml/min- at baseline  obs on tele.  Full code.    Kathleen [name] A 05/01/2015, 12:00 AM

## 2015-05-01 NOTE — Consult Note (Signed)
CARDIOLOGY CONSULT NOTE   Patient ID: Kathleen [name] MRN: [id number] DOB/AGE: Sep 08, 1924 82 y.o.  Admit Date: 04/30/2015 Referring Physician: TRH-Goodrich MD Primary Physician: [name]., MD Consulting Cardiologist: Nona Dell MD Primary Cardiologist: Thurmon Fair MD Reason for Consultation: Chest Pain  Clinical Summary Kathleen [name] is an 82 y.o.female with known history of normal coronary arteries at angiography in 2013, CHF, pacemaker in the setting of second-degree AV block, which had improved with cardiac resynchronization pacing with implantation of the Medtronic biventricular device. Most recent interrogation January 2017. Normally functioning device, 100% pacemaker dependent.  The patient presented to the emergency room, was an episode of subscapular chest pain that lasted for 2 hours, radiating to her jaw. She also complained of associated dyspnea. The patient states that the dyspnea is not new, she states that she normally gets mild shortness of breath walking from one room to another. However, her daughter at bedside states that she has noticed that her breathing has worsened over the last month. Also, the patient states when she gets up and does minimal activity such as walking, she notices that her heart rate races. When this occurs, the patient takes a nitroglycerin and rests. She states it improves her breathing and her heart rate.  On day of admission, the patient had had low-grade nausea all day, she states that she took a medication to help her with nausea and ate some crackers. Later, when she ate supper, she had a fullness in her chest, irradiated up into her neck. She took nitroglycerin 2 to help with relief of symptoms. Her daughter took her blood pressure and noticed it to be lower than normal at 112/68 with a heart rate of 89. The daughter states that her pacemaker is set on 71. Due to the patient's symptoms she was brought to the emergency  room.  On arrival to the emergency room, patient's blood pressure was 131/53, heart rate 74, O2 sat 96%. She was afebrile. UA was negative for UTI, she was found to be hypokalemic with a potassium of 3.3, her sodium was also mildly low at 131, glucose was elevated at 209, with a serum creatinine of 1.43. There was no evidence of leukocytosis. Hemoglobin was 12.3 with hematocrit of 36.7. Troponin was negative at less than 0.03. Pro BNP mildly elevated at 589. Chest x-ray revealed small bilateral pleural effusions with chronic interstitial markings and emphysema. EKG revealed AV pacing, rate of 81 bpm. The patient was admitted for observation and to rule out cardiac etiology of chest pain.  On further questioning, the patient is not been taking Lasix as directed. She was to take a dose every other day, with potassium replacement. The patient is only taking it when necessary when she has feels that she has swelling and does not always take potassium with it. She states that the pills too large to swallow, especially when she is nauseated.  Allergies  Allergen Reactions  . Coumadin [Warfarin Sodium] Other (See Comments)    Caused Patient to Bleed Out.   . Meloxicam Other (See Comments)    Unknown  . Metformin And Related Other (See Comments)    Cannot have due to kidney function  . Morphine Itching    Not in right state of mind.   . Nabumetone Nausea And Vomiting  . Oxycodone Nausea And Vomiting  . Sulfonamide Derivatives Hives    Medications Scheduled Medications: . amiodarone  200 mg Oral Daily  . apixaban  2.5 mg Oral BID  .  diltiazem  240 mg Oral Daily  . feeding supplement (ENSURE ENLIVE)  237 mL Oral BID BM  . metoprolol tartrate  25 mg Oral BID  . potassium chloride  40 mEq Oral BID   Followed by  . [START ON 05/02/2015] potassium chloride  20 mEq Oral Daily  . potassium chloride SA  20 mEq Oral Daily  . pravastatin  40 mg Oral Daily  . sodium chloride flush  3 mL Intravenous Q12H     PRN Medications: sodium chloride, acetaminophen, furosemide, HYDROcodone-acetaminophen, ondansetron (ZOFRAN) IV, sodium chloride flush   Past Medical History  Diagnosis Date  . Essential hypertension   . Type 2 diabetes mellitus (HCC)   . Hyperlipidemia   . PAF (paroxysmal atrial fibrillation) (HCC)   . History of pneumonia   . CAD (coronary artery disease)   . Acid reflux   . AV block, 2nd degree     S/p Medtronic pacemaker 03/2011  . Nonischemic cardiomyopathy (HCC)   . Chronic kidney disease (CKD), stage III (moderate)     Past Surgical History  Procedure Laterality Date  . Replacement total knee    . Bladder tack  1970's  . Pacemaker insertion  10/16/2011    Medtronic  . Cholecystectomy  1999  . Nm myocar perf wall motion  03/15/2009    Normal  . Cardiac catheterization  10/15/2011    Normal coronaries  . Cardioversion N/A 10/28/2013    Procedure: CARDIOVERSION;  Surgeon: Thurmon Fair, MD;  Location: [facility] ENDOSCOPY;  Service: Cardiovascular;  Laterality: N/A;  . Permanent pacemaker insertion N/A 03/24/2011    Procedure: PERMANENT PACEMAKER INSERTION;  Surgeon: Thurmon Fair, MD;  Location: [facility] CATH LAB;  Service: Cardiovascular;  Laterality: N/A;  . Left and right heart catheterization with coronary angiogram N/A 10/15/2011    Procedure: LEFT AND RIGHT HEART CATHETERIZATION WITH CORONARY ANGIOGRAM;  Surgeon: Chrystie Nose, MD;  Location[facility] CATH LAB;  Service: Cardiovascular;  Laterality: N/A;    Family History  Problem Relation Age of Onset  . Heart murmur Daughter   . Suicidality Mother   . Heart attack Brother     Social History Kathleen [name] reports that she has never smoked. She has never used smokeless tobacco. Kathleen [name] reports that she does not drink alcohol.  Review of Systems Complete review of systems are found to be negative unless outlined in H&P above.  Physical Examination Blood pressure 148/62, pulse 69, temperature 98 F (36.7 C),  temperature source Oral, resp. rate 22, height 5\' 8"  (1.727 m), weight 146 lb 6.4 oz (66.407 kg), SpO2 94 %.  Intake/Output Summary (Last 24 hours) at 05/01/15 0934 Last data filed at 05/01/15 0836  Gross per 24 hour  Intake    123 ml  Output    300 ml  Net   -177 ml    Telemetry: AV pacing rates in the 70s.  GEN: Frail, no acute distress HEENT: Conjunctiva and lids normal, oropharynx clear with moist mucosa. Neck: Supple, no elevated JVP or carotid bruits, no thyromegaly. Lungs: Essentially clear to auscultation with some mild bibasilar crackles. No wheezing or coughing. Cardiac: Regular rate and rhythm, no S3 or significant systolic murmur, no pericardial rub. Abdomen: Soft, nontender, no hepatomegaly, bowel sounds present, no guarding or rebound. Extremities: No pitting edema, distal pulses 2+. Wearing sequential compression device.  Skin: Warm and dry. Musculoskeletal: No kyphosis. Neuropsychiatric: Alert and oriented x3, affect grossly appropriate.  Prior Cardiac Testing/Procedures 1. Medtronic PPM Implantation: 03/24/18 Medtronic Revo model  RV DR01, serial number [id number] and [id number].  2. Right and Left Heart Catheterization 10/15/2011 Right Heart Catheterization: RA - 3 RV - 37/4 PA - 36/11 (23) PCWP - 15 TPG - 8 PA Sat - 65% AO Sat - 96% (RA) TDCO/CI - 2.65/1.47 FCO/CI - 5.12/2.84 PVR - 2.2 wood units  Coronary Angiographic Data:  Left Main: Short left main, angiographically normal  Left Anterior Descending (LAD): Angiographically normal, normal caliber, extends around the apex  1st diagonal (D1): Moderate sized vessel, no stenosis  2nd diagonal (D2): Small vessel, no angiographic stenosis.  Circumflex (LCx): Moderate sized vessel, no stenosis  1st obtuse marginal: Large vessel with high lateral takeoff, no stenosis.  Ramus Intermedius:  True ramus vessel, moderate sized, that coarses between the LAD and high OM branch. No stenosis.  Right Coronary Artery: Smaller caliber, but dominant. No angiographic stenosis. Ostial speck of calcium, but not stenotic.  posterior descending artery: Normal  posterior lateral branch: Normal  Impression: 1. Angiographically normal coronary arteries. 2. Reduced cardiac output with normal right heart pressures. Normal PA saturation. Normal PVR. 3. 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  Plan: 1. Discussed with Dr. Royann Shivers, will contact [facility] for evaluation of BI-V pacing upgrade. 2. Continue heart failure treatment, however, she appears adequately diuresed at this point and can lay flat without difficulty.  BiV PPM Upgrade 10/16/2011 Medronic C4TR01 Consulta CRT-P [id number]   Recent Device Interrogation on 02/07/2015:  Complete heart block, pacemaker dependent. Virtually 100% CRT pacing, battery status good, lead measurements are stable, heart rate histograms favorable. No clinically significant episodes of high ventricular rate over atrial mode switches noted. Recent decline in activity level noted. Chart review reveals recent fall and pubic ramus fracture.  Lab Results  Basic Metabolic Panel:  Recent Labs Lab 04/30/15 2050  NA 131*  K 3.3*  CL 96*  CO2 26  GLUCOSE 209*  BUN 13  CREATININE 1.43*  CALCIUM 8.4*    CBC:  Recent Labs Lab 04/30/15 2050  WBC 8.5  HGB 12.3  HCT 36.7  MCV 97.6  PLT 263    Cardiac Enzymes:  Recent Labs Lab 04/30/15 2050 05/01/15 0010 05/01/15 0256 05/01/15 0556  TROPONINI <0.03 <0.03 <0.03 <0.03    Radiology: Dg Chest 2 View  04/30/2015  CLINICAL DATA:  Upper chest pain radiating to jaw EXAM: CHEST  2 VIEW COMPARISON:  10/27/2014 FINDINGS: Chronic interstitial markings/emphysematous changes. Biapical pleural-parenchymal scarring. Small bilateral pleural effusions.  No pneumothorax. Cardiomegaly.  Left subclavian  pacemaker. Mild degenerative changes of the visualized thoracolumbar spine. IMPRESSION: Small bilateral pleural effusions. Chronic interstitial markings/emphysematous changes. Electronically Signed   By: Charline Bills M.D.   On: 04/30/2015 22:29   ECG: AV pacing, heart rate 84 bpm.  Impression and Recommendations  1. Chest pain: Typical and atypical features associated after eating, described as a fullness in her upper chest radiating into her neck. No dysphasia. The patient has had chronic nausea with worsening throughout the day prior to this episode. Nitroglycerin did not help with the pain or pressure, but did drop her blood pressure, some concerning daughter who brought her to ER.   Troponins are found to be negative 3. ECG reveals AV pacing. Echocardiogram has been ordered. ACS has been ruled out.   2. Hypokalemia: We will replace. Patient does not take potassium regularly stating that the pills too large and difficult to swallow. She states that she has chronic nausea and it makes it worse when she takes the potassium pill. I have offered a  liquid form for her to try, to make it easier for her to swallow. She is willing to try this. We will check magnesium. This may be related to taking Lasix without potassium replacement.  3. Chronic lower extremity edema: Most recent echocardiogram in September 2015 revealed normal systolic function, wall thickness increased with pattern of mild LVH. Diastolic function was not commented on. Should be on Lasix every other day, per daughter at 40 mg. However, review of home meds has listed as when necessary. She is also on diltiazem, which can cause dependent edema. There is no evidence of edema. While she is in the bed using the SCDs. The patient states that she notices that her feet are swelling more often now at home. May need to consider low dose Lasix on a daily basis at 20 mg. Will review echo before making changes.  4. Paroxysmal Atrial fib:  Controlled on amiodarone and diltiazem along with metoprolol. Review of ECG, and most recent pacemaker interrogation reveals that she is pacemaker dependent with 100% CRT pacing. She continues on Eliquis 2.5 mg twice a day for a CHADS VASC Score of 5.   5. Nonischemic cardiopathy: Repeating echocardiogram. Would continue metoprolol 25 mg twice a day  6. Chronic dyspnea: Chest x-ray reveals evidence of emphysema. Patient uses nitroglycerin often for dyspnea. May need to consider inhalers versus pulmonology evaluation with evidence of emphysema per chest x-ray.  7. Biventricular pacemaker in situ: This is a Medtronic. Will have this interrogated while admitted. She is due to have a follow-up appointment with primary cardiologist, next week. This information will be made available for that appointment.   Signed: Bettey Mare. Lawrence NP AACC  05/01/2015, 9:34 AM Co-Sign MD  Attending note:  Patient seen and examined. Reviewed records and updated chart. Modified above note by Ms. Lawrence NP. Kathleen [name] presents with complaints of leg edema, exertional shortness of breath, and somewhat atypical chest discomfort. She uses nitroglycerin at home for some of these symptoms, but has a history of normal coronary arteries by cardiac catheterization in 2013. She has not been consistent in using Lasix or potassium supplementation. Otherwise she reports compliance with her medications. She denies any significant sense of racing heartbeat. Last device interrogation was in January at which time she had 100% CRT pacing.  On examination she appears comfortable this morning. She is afebrile. Heart rate in the 70s with pacing by telemetry. Systolic blood pressure 140s to 150s. Lungs exhibit decreased breath sounds at the bases with scattered rhonchi and crackles, no active wheezing. Cardiac exam reveals RRR with soft systolic murmur and no gallop. Legs show 1+ edema. Chest x-ray shows small bilateral pleural effusions  with chronic interstitial markings. BNP 589. Troponin I levels are normal. ECG shows dual chamber pacing.  Patient presents with suspected acute on chronic diastolic heart failure symptoms, inconsistent use of diuretics at home. There is no clear evidence of ACS and chest pain symptoms are somewhat atypical. She does not endorse any palpitations to suspect PAF, telemetry shows paced rhythm at this time. Would place her on IV Lasix for now and follow-up on echocardiogram. Replete potassium. Device is being interrogated as well. If she improves clinically, suspect stable for discharge within the next 24 hours. She will need to be consistent with using her Lasix as directed, would consider using Micro-K for potassium supplementation as it may be easier for her to swallow. Keep follow-up with Dr. Royann Shivers as scheduled.   Jonelle Sidle, M.D., F.A.C.C.

## 2015-05-01 NOTE — Progress Notes (Signed)
Inpatient Diabetes Program Recommendations  AACE/ADA: New Consensus Statement on Inpatient Glycemic Control (2015)  Target Ranges:  Prepandial:   less than 140 mg/dL      Peak postprandial:   less than 180 mg/dL (1-2 hours)      Critically ill patients:  140 - 180 mg/dL  Results for Kathleen [name], Kathleen [name] (MRN [id number]) as of 05/01/2015 09:59  Ref. Range 04/30/2015 20:50  Glucose Latest Ref Range: 65-99 mg/dL 025 (H)   Review of Glycemic Control  Diabetes history: DM2 Outpatient Diabetes medications: Amaryl 0.5 mg daily Current orders for Inpatient glycemic control: None  Inpatient Diabetes Program Recommendations: Correction (SSI): While inpatient, please consider ordering CBGs with Novolog correction scale.  Thanks, Orlando Penner, RN, MSN, CDE Diabetes Coordinator Inpatient Diabetes Program [phone] (Team Pager from 8am to 5pm) [phone] ([facility] office) [phone] [facility] [place] office) [phone] [facility] office)

## 2015-05-01 NOTE — Progress Notes (Addendum)
PROGRESS NOTE  Kathleen [name] ZOX:[id number] DOB: 05/01/1924 DOA: 04/30/2015 PCP: [name]., MD Outpatient Specialists: Thurmon Fair MD  Brief Narrative: 82 year old woman past medical history nonischemic cardiomyopathy, atrial fibrillation, pacemaker placement presented with chest pain radiating to the jaw with associated shortness of breath, skin reported several episodes a month for many months. Reported left heart catheterization 4 years prior with no significant coronary artery disease. Admitted for chest pain, cardiology consultation.  Assessment/Plan: 1. Chest pain, resolved currently. ACS ruled out with negative cardiac markers 2. Suspected acute on chronic diastolic heart failure as per cardiology. Diuretics. 3. Paroxysmal atrial fibrillation. Eliquis 2.5 mg twice a day for a CHADS VASC Score of 5 4. Status post pacemaker placement 5. Diabetes mellitus with chronic kidney disease 6. Chronic kidney disease stage III 7. Chest x-ray suggestive of emphysema. Consider outpatient pulmonology evaluation for further comment.   Appears improved. No chest pain. Continue IV diuresis. Appreciate cardiology input.   Anticipate discharge in 24 hours.   DVT prophylaxis: Eliquis  Code Status: Full Family Communication: Daughter, [name], bedside.  Disposition Plan: Anticipate discharge in 24 hours.   Brendia Sacks, MD  [facility] Direct contact:  --Via amion app OR  --[email or web address]; password [email or web address] and click  7PM-7AM contact night coverage as above 05/01/2015, 3:19 PM    Consultants:  Cardiology   Procedures:  None  Antimicrobials:  None  HPI/Subjective: Pain has resolved. Has mild nausea. Denies vomiting but has had some gagging. No difficulty swallowing.  Per daughter she has had mild diarrhea.   Objective: Filed Vitals:   04/30/15 2330 05/01/15 0005 05/01/15 0400 05/01/15 1418  BP: 149/72 152/70 148/62 132/62  Pulse: 69 73 69 70  Temp:  97.7  F (36.5 C) 98 F (36.7 C) 98.4 F (36.9 C)  TempSrc:  Oral Oral   Resp: Height:   (1.727 m)    Weight:  66.407 kg (146 lb 6.4 oz)    SpO2: 93% 95% 94% 94%    Intake/Output Summary (Last 24 hours) at 05/01/15 1519 Last data filed at 05/01/15 1300  Gross per 24 hour  Intake    723 ml  Output    300 ml  Net    423 ml     Filed Weights   04/30/15 2028 05/01/15 0005  Weight: 66.225 kg (146 lb) 66.407 kg (146 lb 6.4 oz)    Exam:  Constitutional:  . Appears calm and comfortable Respiratory:  . Inspiratory crackles on the left, right is clear.  Bilateral expiratory crackles in the posterior.  Marland Kitchen Respiratory effort normal. No retractions or accessory muscle use Cardiovascular:  . RRR, no m/r/g . 1+ bilateral pedal edema .  Telemetry: SR Abdomen:  . Abdomen appears normal; no tenderness or masses Psychiatric:  . Mental status o Mood, affect appropriate  I have personally reviewed following labs and imaging studies:  Troponins negative  Urinalysis unremarkable  Scheduled Meds: . amiodarone  200 mg Oral Daily  . apixaban  2.5 mg Oral BID  . diltiazem  240 mg Oral Daily  . feeding supplement (ENSURE ENLIVE)  237 mL Oral BID BM  . furosemide  40 mg Intravenous Daily  . metoprolol tartrate  25 mg Oral BID  . potassium chloride  40 mEq Oral BID   Followed by  . [START ON 05/02/2015] potassium chloride  20 mEq Oral Daily  . potassium chloride SA  20 mEq Oral Daily  . pravastatin  40 mg Oral Daily  .  sodium chloride flush  3 mL Intravenous Q12H   Continuous Infusions:   Principal Problem:   Chest pain Active Problems:   Hypertension   DM type 2 (diabetes mellitus, type 2) (HCC)   Paroxysmal a-fib (HCC)   PPM-Medtronic   Chronic combined systolic and diastolic CHF (congestive heart failure) (HCC)   CKD (chronic kidney disease) stage 3, GFR 30-59 ml/min   Acute on chronic diastolic CHF (congestive heart failure) (HCC)     Time spent 25  minutes   By signing my name below, I, Zadie Cleverly attest that this documentation has been prepared under the direction and in the presence of Brendia Sacks, MD Electronically signed: Zadie Cleverly  05/01/2015 11:41am   I personally performed the services described in this documentation. All medical record entries made by the scribe were at my direction. I have reviewed the chart and agree that the record reflects my personal performance and is accurate and complete. Brendia Sacks, MD

## 2015-05-02 DIAGNOSIS — I2510 Atherosclerotic heart disease of native coronary artery without angina pectoris: Secondary | ICD-10-CM | POA: Diagnosis not present

## 2015-05-02 DIAGNOSIS — I5033 Acute on chronic diastolic (congestive) heart failure: Secondary | ICD-10-CM | POA: Diagnosis not present

## 2015-05-02 DIAGNOSIS — N183 Chronic kidney disease, stage 3 (moderate): Secondary | ICD-10-CM | POA: Diagnosis not present

## 2015-05-02 DIAGNOSIS — R079 Chest pain, unspecified: Secondary | ICD-10-CM

## 2015-05-02 DIAGNOSIS — Z7901 Long term (current) use of anticoagulants: Secondary | ICD-10-CM | POA: Diagnosis not present

## 2015-05-02 DIAGNOSIS — I5043 Acute on chronic combined systolic (congestive) and diastolic (congestive) heart failure: Secondary | ICD-10-CM | POA: Diagnosis not present

## 2015-05-02 DIAGNOSIS — R0789 Other chest pain: Secondary | ICD-10-CM | POA: Diagnosis not present

## 2015-05-02 DIAGNOSIS — R05 Cough: Secondary | ICD-10-CM | POA: Diagnosis not present

## 2015-05-02 DIAGNOSIS — I130 Hypertensive heart and chronic kidney disease with heart failure and stage 1 through stage 4 chronic kidney disease, or unspecified chronic kidney disease: Secondary | ICD-10-CM | POA: Diagnosis not present

## 2015-05-02 DIAGNOSIS — Z950 Presence of cardiac pacemaker: Secondary | ICD-10-CM | POA: Diagnosis not present

## 2015-05-02 DIAGNOSIS — J90 Pleural effusion, not elsewhere classified: Secondary | ICD-10-CM | POA: Diagnosis not present

## 2015-05-02 DIAGNOSIS — I5042 Chronic combined systolic (congestive) and diastolic (congestive) heart failure: Secondary | ICD-10-CM | POA: Diagnosis not present

## 2015-05-02 DIAGNOSIS — E7800 Pure hypercholesterolemia, unspecified: Secondary | ICD-10-CM | POA: Diagnosis not present

## 2015-05-02 DIAGNOSIS — E876 Hypokalemia: Secondary | ICD-10-CM | POA: Diagnosis not present

## 2015-05-02 DIAGNOSIS — I480 Paroxysmal atrial fibrillation: Secondary | ICD-10-CM | POA: Diagnosis not present

## 2015-05-02 DIAGNOSIS — E1122 Type 2 diabetes mellitus with diabetic chronic kidney disease: Secondary | ICD-10-CM | POA: Diagnosis not present

## 2015-05-02 DIAGNOSIS — T502X5A Adverse effect of carbonic-anhydrase inhibitors, benzothiadiazides and other diuretics, initial encounter: Secondary | ICD-10-CM | POA: Diagnosis not present

## 2015-05-02 DIAGNOSIS — I429 Cardiomyopathy, unspecified: Secondary | ICD-10-CM | POA: Diagnosis not present

## 2015-05-02 LAB — BASIC METABOLIC PANEL
ANION GAP: 6 (ref 5–15)
BUN: 12 mg/dL (ref 6–20)
CALCIUM: 9.2 mg/dL (ref 8.9–10.3)
CO2: 30 mmol/L (ref 22–32)
Chloride: 99 mmol/L — ABNORMAL LOW (ref 101–111)
Creatinine, Ser: 1.12 mg/dL — ABNORMAL HIGH (ref 0.44–1.00)
GFR calc non Af Amer: 43 mL/min — ABNORMAL LOW (ref >60)
GFR, EST AFRICAN AMERICAN: 49 mL/min — AB (ref >60)
Glucose, Bld: 137 mg/dL — ABNORMAL HIGH (ref 65–99)
Potassium: 4.3 mmol/L (ref 3.5–5.1)
Sodium: 135 mmol/L (ref 135–145)

## 2015-05-02 LAB — GLUCOSE, CAPILLARY: GLUCOSE-CAPILLARY: 173 mg/dL — AB (ref 65–99)

## 2015-05-02 MED ORDER — FUROSEMIDE 10 MG/ML IJ SOLN
40.0000 mg | Freq: Two times a day (BID) | INTRAMUSCULAR | Status: DC
  Administered 2015-05-02 – 2015-05-04 (×4): 40 mg via INTRAVENOUS
  Filled 2015-05-02 (×4): qty 4

## 2015-05-02 MED ORDER — LORAZEPAM 0.5 MG PO TABS
0.2500 mg | ORAL_TABLET | Freq: Once | ORAL | Status: AC
  Administered 2015-05-02: 0.25 mg via ORAL
  Filled 2015-05-02: qty 1

## 2015-05-02 MED ORDER — METOPROLOL SUCCINATE ER 50 MG PO TB24
75.0000 mg | ORAL_TABLET | Freq: Every day | ORAL | Status: DC
  Administered 2015-05-02 – 2015-05-04 (×3): 75 mg via ORAL
  Filled 2015-05-02 (×3): qty 1

## 2015-05-02 NOTE — Care Management Obs Status (Signed)
MEDICARE OBSERVATION STATUS NOTIFICATION   Patient Details  Name: Kathleen [name] MRN: [id number] Date of Birth: [birth year]   Medicare Observation Status Notification Given:  Yes    Adonis Huguenin, RN 05/02/2015, 12:44 PM

## 2015-05-02 NOTE — Progress Notes (Signed)
[facility] PROGRESS NOTE  JAMESE TRAUGER ZOX:[id number] DOB: 1924/12/01 DOA: 04/30/2015 PCP: [name]., MD  Assessment/Plan: Acute on chronic combined CHF -Is still exhibiting signs of volume overload. -Agree with plan to increase Lasix to 40 mg IV twice daily. Strive for negative fluid balance.   Paroxysmal atrial fibrillation -Is on Eliquis for anticoagulation. -Is currently maintaining sinus rhythm on amiodarone.  Acute on chronic Stage III chronic kidney disease -Creatinine is improved down to 1.1 which is her baseline.  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  Chest pain -Resolved, suspect related to CHF. -Serial troponins have been negative, EKG does not demonstrate acute ischemic abnormalities.  Hypokalemia -Due to diuresis, repleted.   Code Status: Full code Family Communication: Daughter at bedside updated on plan of care and all questions answered  Disposition [place] be determined   Consultants:  Cardiology   Antibiotics:  None   Subjective: Feels well, no chest pain, does have some mild shortness of breath and feels "anxious" at times  Objective: Filed Vitals:   05/01/15 2151 05/02/15 0535 05/02/15 1052 05/02/15 1217  BP: 127/62 122/87 144/79 153/74  Pulse: 68 70 87 84  Temp: 98.1 F (36.7 C) 98.1 F (36.7 C) 97.7 F (36.5 C)   TempSrc: Oral Oral Oral   Resp: Height:      Weight:  66.31 kg (146 lb 3 oz)    SpO2: 95% 90% 96% 86%    Intake/Output Summary (Last 24 hours) at 05/02/15 1516 Last data filed at 05/02/15 1222  Gross per 24 hour  Intake    480 ml  Output   1550 ml  Net  -1070 ml   Filed Weights   04/30/15 2028 05/01/15 0005 05/02/15 0535  Weight: 66.225 kg (146 lb) 66.407 kg (146 lb 6.4 oz) 66.31 kg (146 lb 3 oz)    Exam:   GeneAlert, awake,  oriented 3  Cardiovascular: regular rate and rhythm, no murmurs, rubs or gallops  Respiratory: crackles bilaterally  Abdomen: soft/NT/ND/+BS  Extremities: no C/C/E   Neurologic:  Non-focal  Data Reviewed: Basic Metabolic Panel:  Recent Labs Lab 04/30/15 2050 05/02/15 0621  NA 131* 135  K 3.3* 4.3  CL 96* 99*  CO2 26 30  GLUCOSE 209* 137*  BUN 13 12  CREATININE 1.43* 1.12*  CALCIUM 8.4* 9.2   Liver Function Tests: No results for input(s): AST, ALT, ALKPHOS, BILITOT, PROT, ALBUMIN in the last 168 hours. No results for input(s): LIPASE, AMYLASE in the last 168 hours. No results for input(s): AMMONIA in the last 168 hours. CBC:  Recent Labs Lab 04/30/15 2050  WBC 8.5  HGB 12.3  HCT 36.7  MCV 97.6  PLT 263   Cardiac Enzymes:  Recent Labs Lab 04/30/15 2050 05/01/15 0010 05/01/15 0256 05/01/15 0556  TROPONINI <0.03 <0.03 <0.03 <0.03   BNP (last 3 results)  Recent Labs  10/27/14 1100 04/30/15 2050  BNP 530.0* 589.0*    ProBNP (last 3 results) No results for input(s): PROBNP in the last 8760 hours.  CBG: No results for input(s): GLUCAP in the last 168 hours.  No results found for this or any previous visit (from the past 240 hour(s)).   Studies: Dg Chest 2 View  04/30/2015  CLINICAL DATA:  Upper chest pain radiating to jaw EXAM: CHEST  2 VIEW COMPARISON:  10/27/2014 FINDINGS: Chronic interstitial markings/emphysematous changes. Biapical pleural-parenchymal  scarring. Small bilateral pleural effusions.  No pneumothorax. Cardiomegaly.  Left subclavian pacemaker. Mild degenerative changes of the visualized thoracolumbar spine. IMPRESSION: Small bilateral pleural effusions. Chronic interstitial markings/emphysematous changes. Electronically Signed   By: Charline Bills M.D.   On: 04/30/2015 22:29    Scheduled Meds: . amiodarone  200 mg Oral Daily  . apixaban  2.5 mg Oral BID  . feeding supplement (ENSURE ENLIVE)  237 mL Oral BID BM  . furosemide  40  mg Intravenous BID  . metoprolol succinate  75 mg Oral Daily  . potassium chloride  20 mEq Oral Daily  . pravastatin  40 mg Oral Daily  . sodium chloride flush  3 mL Intravenous Q12H   Continuous Infusions:   Principal Problem:   Chest pain Active Problems:   Hypertension   DM type 2 (diabetes mellitus, type 2) (HCC)   Paroxysmal a-fib (HCC)   PPM-Medtronic   Chronic combined systolic and diastolic CHF (congestive heart failure) (HCC)   CKD (chronic kidney disease) stage 3, GFR 30-59 ml/min   Acute on chronic diastolic CHF (congestive heart failure) (HCC)   Pain in the chest    Time spent: 25 minutes. Greater than 50% of this time was spent in direct contact with the patient coordinating care.    Chaya Jan  [facility] Pager [phone]  If 7PM-7AM, please contact night-coverage at [email or web address], password [facility] 05/02/2015, 3:16 PM

## 2015-05-02 NOTE — Progress Notes (Signed)
Pt feeling short of breath, O2 sat 89% on room air. O2 place at 2L [place].

## 2015-05-02 NOTE — Progress Notes (Signed)
Initial Nutrition Assessment  DOCUMENTATION CODES:   Not applicable  INTERVENTION:  -Ensure Enlive BID. Each supplement provides 350 kcals and 20 grams of protein.  -Continue to monitor nutritional needs   NUTRITION DIAGNOSIS:   Inadequate oral intake related to poor appetite, other (see comment) (difficulty chewing and texture aversion) as evidenced by per patient/family report  GOAL:   Patient will meet greater than or equal to 90% of their needs  MONITOR:   PO intake, Supplement acceptance, Labs, Weight trends, Skin, I & O's  REASON FOR ASSESSMENT:   Malnutrition Screening Tool    ASSESSMENT:   82 yo female h/o NICM, afib, CKD, CAD, pacemaker placed for AV block 2nd degree comes in with episode of sscp that last for over 2 hours and radiated to her jaw that she says was relieved by "relaxing" in the ED. She did receive several doses of ntg sublingual thru the day. Pt is now cp free. She reports associated sob with it. She denies fevers. No cough. No weight gain. Pt is poor historian but family member is present and also adds that she has been having these "episodes" several times a month for many months. Pt did have a heart cath about 4 years ago which did not show any significant CAD. Pt referred for admission for her chest pain.  Pt seen for MST. Pt reports UBW of 145 for at least the past 6 months. Per chart, pt weight has fluctuated between 145-155 lbs since 10/2014. Pt reports poor appetite but mostly d/t aging. Pt reports mouth getting tired when chewing and gagging when eating softer textures. Pt wants her food to be crunchy and reports soft breads and soggy cereal make her gag. Pt does not feel that she has difficulty swallowing. After questioning, intern suspects aversion to certain mouth feels and not impaired swallowing. Pt reports eating TID with a bedtime snack PTA. Pt diet recall reveals low intake. D/t advanced age, this is normal but pt could benefit from  nutritional supplement such as Ensure. Pt reports drinking an Ensure at night PTA but got bored with it sometimes. Intern encouraged pt to add that back in and emphasized the importance of maintaining muscle mass and weight at advanced age. Per chart, pt is eating 15-75% of meals. Will continue Ensure BID while in hospital to supplement for low intake. NFPE: Mild muscle depletion, no fat depletion, mild edema. Suspect muscle depletion d/t advanced age.   Labs reviewed; Cl 99, creatinine 1.12, GFR 43, glucose 137 Meds reviewed; Lasix 40 mEq, KCl 20 mEq  Diet Order:  Diet Carb Modified Fluid consistency:: Thin; Room service appropriate?: Yes  Skin:  Reviewed, no issues  Last BM:  4/18  Height:   Ht Readings from Last 1 Encounters:  05/01/15 5\' 8"  (1.727 m)    Weight:   Wt Readings from Last 1 Encounters:  05/02/15 146 lb 3 oz (66.31 kg)    Ideal Body Weight:  63.6 kg  BMI:  Body mass index is 22.23 kg/(m^2).  Estimated Nutritional Needs:   Kcal:  1350-1550 kcals (20-23 kcals/kg)  Protein:  80-90 g (1.3-1.4 g/kg)  Fluid:  per md   EDUCATION NEEDS:   No education needs identified at this time  Beryle Quant, MS [facility] Dietetic Intern Pager [phone]

## 2015-05-02 NOTE — Care Management Note (Signed)
Case Management Note  Patient Details  Name: Kathleen [name] MRN: [id number] Date of Birth: 1924/07/08  Subjective/Objective:    Spoke with patient and daughter at the bedside. Patient is from home with the daughter and has all DME, Patient is not on O2 at home but is on it here. May need Home O2.  Patient stated multiple times that she was ready to go home. However, noted that her work of breathing was still increased.    Patient may need home O2  Daughter drives patient to appointments. Home Bound not met/            Action/Plan: Home with care of family   Expected Discharge Date:                  Expected Discharge Plan:  [place]  In-House Referral:     Discharge planning Services  CM Consult  Post Acute Care Choice:  Resumption of Svcs/PTA Provider Choice offered to:     DME Arranged:    DME Agency:     HH Arranged:    [place] Agency:     Status of Service:  In process, will continue to follow  Medicare Important Message Given:    Date Medicare IM Given:    Medicare IM give by:    Date Additional Medicare IM Given:    Additional Medicare Important Message give by:     If discussed at [place] of Stay Meetings, dates discussed:    Additional Comments:  Alvie Heidelberg, RN 05/02/2015, 1:04 PM

## 2015-05-02 NOTE — Progress Notes (Signed)
Primary cardiologist: Dr. Thurmon Fair  Seen for followup: Acute on chronic combined heart failure  Subjective:    Patient remains short of breath, daughter states that she had a "bad night" complaining of dyspnea and not sleeping well. No chest pain or palpitations.  Objective:   Temp:  [98.1 F (36.7 C)-98.7 F (37.1 C)] 98.1 F (36.7 C) (04/19 0535) Pulse Rate:  [68-70] 70 (04/19 0535) Resp:  [16-18] 18 (04/19 0535) BP: (122-132)/(58-87) 122/87 mmHg (04/19 0535) SpO2:  [90 %-96 %] 90 % (04/19 0535) Weight:  [146 lb 3 oz (66.31 kg)] 146 lb 3 oz (66.31 kg) (04/19 0535) Last BM Date: 05/01/15  Filed Weights   04/30/15 2028 05/01/15 0005 05/02/15 0535  Weight: 146 lb (66.225 kg) 146 lb 6.4 oz (66.407 kg) 146 lb 3 oz (66.31 kg)    Intake/Output Summary (Last 24 hours) at 05/02/15 0842 Last data filed at 05/02/15 0536  Gross per 24 hour  Intake   1080 ml  Output    900 ml  Net    180 ml    Telemetry: Dual-chamber pacing.  Exam:  General: Elderly woman in no distress but mildly short of breath at rest.  HEENT: Conjunctiva and lids normal, oropharynx clear.  Lungs: Decreased breath sounds at the bases with scattered crackles at the bases and rhonchi.  Cardiac: RRR without gallop.  Abdomen: NABS.  Extremities: Mild edema.   Lab Results:  Basic Metabolic Panel:  Recent Labs Lab 04/30/15 2050 05/02/15 0621  NA 131* 135  K 3.3* 4.3  CL 96* 99*  CO2 26 30  GLUCOSE 209* 137*  BUN 13 12  CREATININE 1.43* 1.12*  CALCIUM 8.4* 9.2    CBC:  Recent Labs Lab 04/30/15 2050  WBC 8.5  HGB 12.3  HCT 36.7  MCV 97.6  PLT 263    Cardiac Enzymes:  Recent Labs Lab 05/01/15 0010 05/01/15 0256 05/01/15 0556  TROPONINI <0.03 <0.03 <0.03    Echocardiogram 05/01/2015: Study Conclusions  - Left ventricle: The cavity size was normal. Wall thickness was  increased in a pattern of mild LVH. Systolic function was mildly  to moderately reduced. The  estimated ejection fraction was in the  range of 40% to 45%. There is akinesis of the inferolateral and  inferior myocardium. Doppler parameters are consistent with  restrictive physiology, indicative of decreased left ventricular  diastolic compliance and/or increased left atrial pressure. - Aortic valve: Trileaflet; mildly calcified leaflets. - Mitral valve: Calcified annulus. There was moderate  regurgitation. - Left atrium: The atrium was severely dilated. - Right ventricle: Pacer wire or catheter noted in right ventricle. - Right atrium: The atrium was mildly dilated. - Tricuspid valve: There was mild regurgitation. - Pulmonary arteries: PA peak pressure: 49 mm Hg (S). - Pericardium, extracardiac: There was no pericardial effusion.  Impressions:  - Mild LVH with LVEF approximately 40-45%. There is akinesis of the  inferolateral wall. Restrictive diastolic filling pattern with  increased LV filling pressure. Severe left atrial enlargement.  MAC with moderate mitral regurgitation. Mildly sclerotic aortic  valve. Device wire noted within the right heart. Mild tricuspid  regurgitation with PASP 49 mmHg.   Medications:   Scheduled Medications: . amiodarone  200 mg Oral Daily  . apixaban  2.5 mg Oral BID  . diltiazem  240 mg Oral Daily  . feeding supplement (ENSURE ENLIVE)  237 mL Oral BID BM  . furosemide  40 mg Intravenous Daily  . metoprolol tartrate  25 mg  Oral BID  . potassium chloride  20 mEq Oral Daily  . pravastatin  40 mg Oral Daily  . sodium chloride flush  3 mL Intravenous Q12H     PRN Medications:  sodium chloride, acetaminophen, HYDROcodone-acetaminophen, ondansetron (ZOFRAN) IV, sodium chloride flush   Assessment:   1. Acute on chronic combined heart failure. Patient remains symptomatic, has not had much diuresis as yet. Follow-up echocardiogram yesterday showed LVEF down to 40-45% range with restrictive diastolic filling pattern.  2. Cardiac  catheterization in 2013 demonstrating normal coronary arteries.  3. Paroxysmal atrial fibrillation, maintaining sinus rhythm on amiodarone at this time. She is on Eliquis for stroke prophylaxis.  4. CKD stage III, most recent creatinine down to 1.1.  5. 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 Device interrogation demonstrated normal function, impedance measurements were consistent with volume overload.  Plan/Discussion:    Discussed with patient and daughter. Not ready for discharge as yet. We will increase Lasix 40 mg IV twice daily. Would like to try and get her off calcium channel blocker with documented reduction in systolic function. Advance beta blocker dose concurrently. Otherwise continue amiodarone.   Jonelle Sidle, M.D., F.A.C.C.

## 2015-05-02 NOTE — Progress Notes (Signed)
Pt called nursing staff to room d/t feeling short of breath. O2 sat 97% on room air. Pt did state that she felt anxious and could not rest. MD paged and orders given for one time dose of Ativan 0.25mg  Po.

## 2015-05-03 ENCOUNTER — Inpatient Hospital Stay (HOSPITAL_COMMUNITY): Payer: PPO

## 2015-05-03 LAB — URINALYSIS, ROUTINE W REFLEX MICROSCOPIC
Bilirubin Urine: NEGATIVE
Glucose, UA: NEGATIVE mg/dL
Hgb urine dipstick: NEGATIVE
Ketones, ur: NEGATIVE mg/dL
Nitrite: NEGATIVE
PROTEIN: NEGATIVE mg/dL
pH: 5.5 (ref 5.0–8.0)

## 2015-05-03 LAB — BASIC METABOLIC PANEL
ANION GAP: 8 (ref 5–15)
BUN: 15 mg/dL (ref 6–20)
CHLORIDE: 95 mmol/L — AB (ref 101–111)
CO2: 33 mmol/L — ABNORMAL HIGH (ref 22–32)
Calcium: 9 mg/dL (ref 8.9–10.3)
Creatinine, Ser: 1.2 mg/dL — ABNORMAL HIGH (ref 0.44–1.00)
GFR calc Af Amer: 45 mL/min — ABNORMAL LOW (ref >60)
GFR, EST NON AFRICAN AMERICAN: 39 mL/min — AB (ref >60)
Glucose, Bld: 129 mg/dL — ABNORMAL HIGH (ref 65–99)
POTASSIUM: 4.1 mmol/L (ref 3.5–5.1)
SODIUM: 136 mmol/L (ref 135–145)

## 2015-05-03 LAB — URINE MICROSCOPIC-ADD ON
BACTERIA UA: NONE SEEN
RBC / HPF: NONE SEEN RBC/hpf (ref 0–5)

## 2015-05-03 NOTE — Progress Notes (Signed)
     Subjective:    Still with DOE waling to bathroom.   Objective:   Temp:  [97.6 F (36.4 C)-98.2 F (36.8 C)] 98.2 F (36.8 C) (04/20 0709) Pulse Rate:  [69-87] 75 (04/20 0709) Resp:  [15-28] 15 (04/20 0709) BP: (135-153)/(69-79) 135/70 mmHg (04/20 0709) SpO2:  [86 %-100 %] 100 % (04/20 0709) Last BM Date: 05/02/15  Filed Weights   04/30/15 2028 05/01/15 0005 05/02/15 0535  Weight: 146 lb (66.225 kg) 146 lb 6.4 oz (66.407 kg) 146 lb 3 oz (66.31 kg)    Intake/Output Summary (Last 24 hours) at 05/03/15 1032 Last data filed at 05/03/15 0900  Gross per 24 hour  Intake    723 ml  Output   1950 ml  Net  -1227 ml    Telemetry: AV paced.   Exam:  General: NAD  HEENT: sclera clear, throat clear  Resp: mild crackles bilateral bases  Cardiac: RRR, no m/r/g, no jvd  GI: abdomen soft, NT, ND  MSK: no LE edema  Neuro: no focal deficits  Psych: appropriate affect  Lab Results:  Basic Metabolic Panel:  Recent Labs Lab 04/30/15 2050 05/02/15 0621 05/03/15 0648  NA 131* 135 136  K 3.3* 4.3 4.1  CL 96* 99* 95*  CO2 26 30 33*  GLUCOSE 209* 137* 129*  BUN 13 12 15   CREATININE 1.43* 1.12* 1.20*  CALCIUM 8.4* 9.2 9.0    Liver Function Tests: No results for input(s): AST, ALT, ALKPHOS, BILITOT, PROT, ALBUMIN in the last 168 hours.  CBC:  Recent Labs Lab 04/30/15 2050  WBC 8.5  HGB 12.3  HCT 36.7  MCV 97.6  PLT 263    Cardiac Enzymes:  Recent Labs Lab 05/01/15 0010 05/01/15 0256 05/01/15 0556  TROPONINI <0.03 <0.03 <0.03    BNP: No results for input(s): PROBNP in the last 8760 hours.  Coagulation: No results for input(s): INR in the last 168 hours.  ECG:   Medications:   Scheduled Medications: . amiodarone  200 mg Oral Daily  . apixaban  2.5 mg Oral BID  . feeding supplement (ENSURE ENLIVE)  237 mL Oral BID BM  . furosemide  40 mg Intravenous BID  . metoprolol succinate  75 mg Oral Daily  . potassium chloride  20 mEq Oral Daily    . pravastatin  40 mg Oral Daily  . sodium chloride flush  3 mL Intravenous Q12H     Infusions:     PRN Medications:  sodium chloride, acetaminophen, HYDROcodone-acetaminophen, ondansetron (ZOFRAN) IV, sodium chloride flush     Assessment/Plan   1. Acute on chronic combined heart failure - 04/2015 echo LVEF 40-45%, restrictive diastolic dysfunction, inferolateral akinesis, mod MR - negative 690 mL yesterday, negative 1.2 liters since admission. She is on lasix 40mg  IV bid, ovearall stable renal function. Weight remains stable at 146 lbs, from clinic note 10/2014 and 02/2015 baseline around 153-156 lbs. She reports her home weights are typically around 146 lbs - nearing euvolemia, we will have her ambulate with nursing staff to see how her symptoms are. She still reports some DOE walking to the bathroom. Depending on symptoms can consider discharge today or tomorrow, I would resume her lasix 40mg  prn at discharge.   2. PAF - she is on amio, Toprol XL, and eliquis - no current symptoms  3. CKD 3 - renal function overall stable.   4. Dysuria - will order UA  Dina Rich, M.D.

## 2015-05-03 NOTE — Progress Notes (Signed)
[facility] PROGRESS NOTE  Kathleen [name] OLM:[id number] DOB: 1924-09-23 DOA: 04/30/2015 PCP: [name]., MD  Assessment/Plan: Acute on chronic combined CHF -Is still exhibiting signs of volume overload. -Agree with plan to increase Lasix to 40 mg IV twice daily. Strive for negative fluid balance.  -She is 1.4 L negative since admission.  -she is still significantly short of breath with ambulation and is requiring oxygen, has crackles on lung auscultation. Will request chest x-ray to evaluate.  Paroxysmal atrial fibrillation -Is on Eliquis for anticoagulation. -Is currently maintaining sinus rhythm on amiodarone.  Acute on chronic Stage III chronic kidney disease -Creatinine is improved down to 1.1-1.2 which is her baseline.  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  Chest pain -Resolved, suspect related to CHF. -Serial troponins have been negative, EKG does not demonstrate acute ischemic abnormalities.  Hypokalemia -Due to diuresis, repleted.   Code Status: Full code Family Communication: Daughter at bedside updated on plan of care and all questions answered  Disposition Plan: to be determined   Consultants:  Cardiology   Antibiotics:  None   Subjective: Feels well, no chest pain, does have some mild shortness of breath and feels "anxious" at times  Objective: Filed Vitals:   05/02/15 1624 05/02/15 2230 05/03/15 0709 05/03/15 1500  BP: 137/69 145/75 135/70 132/63  Pulse: 69 70 75 70  Temp: 97.6 F (36.4 C) 98 F (36.7 C) 98.2 F (36.8 C) 98.3 F (36.8 C)  TempSrc: Oral Oral Oral Oral  Resp: 20 18 15 16   Height:      Weight:      SpO2: 94% 96% 100% 99%    Intake/Output Summary (Last 24 hours) at 05/03/15 1638 Last data filed at 05/03/15 1500  Gross per 24 hour  Intake    723 ml  Output    2050 ml  Net  -1327 ml   Filed Weights   04/30/15 2028 05/01/15 0005 05/02/15 0535  Weight: 66.225 kg (146 lb) 66.407 kg (146 lb 6.4 oz) 66.31 kg (146 lb 3 oz)    Exam:   GeneAlert, awake, oriented 3  Cardiovascular: regular rate and rhythm, no murmurs, rubs or gallops  Respiratory: crackles bilaterally  Abdomen: soft/NT/ND/+BS  Extremities: no C/C/E   Neurologic:  Non-focal  Data Reviewed: Basic Metabolic Panel:  Recent Labs Lab 04/30/15 2050 05/02/15 0621 05/03/15 0648  NA 131* 135 136  K 3.3* 4.3 4.1  CL 96* 99* 95*  CO2 26 30 33*  GLUCOSE 209* 137* 129*  BUN 13 12 15   CREATININE 1.43* 1.12* 1.20*  CALCIUM 8.4* 9.2 9.0   Liver Function Tests: No results for input(s): AST, ALT, ALKPHOS, BILITOT, PROT, ALBUMIN in the last 168 hours. No results for input(s): LIPASE, AMYLASE in the last 168 hours. No results for input(s): AMMONIA in the last 168 hours. CBC:  Recent Labs Lab 04/30/15 2050  WBC 8.5  HGB 12.3  HCT 36.7  MCV 97.6  PLT 263   Cardiac Enzymes:  Recent Labs Lab 04/30/15 2050 05/01/15 0010 05/01/15 0256 05/01/15 0556  TROPONINI <0.03 <0.03 <0.03 <0.03   BNP (last 3 results)  Recent Labs  10/27/14 1100 04/30/15 2050  BNP 530.0* 589.0*    ProBNP (last 3 results) No results for input(s): PROBNP in the last 8760 hours.  CBG:  Recent Labs Lab 05/02/15 1802  GLUCAP 173*    No results found for this or  any previous visit (from the past 240 hour(s)).   Studies: No results found.  Scheduled Meds: . amiodarone  200 mg Oral Daily  . apixaban  2.5 mg Oral BID  . feeding supplement (ENSURE ENLIVE)  237 mL Oral BID BM  . furosemide  40 mg Intravenous BID  . metoprolol succinate  75 mg Oral Daily  . potassium chloride  20 mEq Oral Daily  . pravastatin  40 mg Oral Daily  . sodium chloride flush  3 mL Intravenous Q12H   Continuous Infusions:   Principal Problem:   Chest pain Active Problems:   Hypertension   DM type 2  (diabetes mellitus, type 2) (HCC)   Paroxysmal a-fib (HCC)   PPM-Medtronic   Chronic combined systolic and diastolic CHF (congestive heart failure) (HCC)   CKD (chronic kidney disease) stage 3, GFR 30-59 ml/min   Acute on chronic diastolic CHF (congestive heart failure) (HCC)   Pain in the chest   Acute on chronic combined systolic and diastolic CHF, NYHA class 1 (HCC)    Time spent: 25 minutes. Greater than 50% of this time was spent in direct contact with the patient coordinating care.    Chaya Jan  [facility] Pager [phone]  If 7PM-7AM, please contact night-coverage at [email or web address], password [facility] 05/03/2015, 4:38 PM  LOS: 1 day

## 2015-05-03 NOTE — Progress Notes (Signed)
SATURATION QUALIFICATIONS: (This note is used to comply with regulatory documentation for home oxygen)  Patient Saturations on Room Air at Rest = 95%  Patient Saturations on Room Air while Ambulating = 85%  Patient Saturations on 2L Liters of oxygen while Ambulating = 96%  Please briefly explain why patient needs home oxygen:    Pt. Oxygen saturation dropped while ambulating 300 ft.  Required 2L of O2 nasal cannula.

## 2015-05-04 LAB — BASIC METABOLIC PANEL
Anion gap: 11 (ref 5–15)
BUN: 21 mg/dL — ABNORMAL HIGH (ref 6–20)
CALCIUM: 9.2 mg/dL (ref 8.9–10.3)
CHLORIDE: 90 mmol/L — AB (ref 101–111)
CO2: 33 mmol/L — ABNORMAL HIGH (ref 22–32)
CREATININE: 1.47 mg/dL — AB (ref 0.44–1.00)
GFR calc non Af Amer: 31 mL/min — ABNORMAL LOW (ref >60)
GFR, EST AFRICAN AMERICAN: 36 mL/min — AB (ref >60)
Glucose, Bld: 233 mg/dL — ABNORMAL HIGH (ref 65–99)
Potassium: 4 mmol/L (ref 3.5–5.1)
SODIUM: 134 mmol/L — AB (ref 135–145)

## 2015-05-04 LAB — MAGNESIUM: MAGNESIUM: 1.7 mg/dL (ref 1.7–2.4)

## 2015-05-04 MED ORDER — METOPROLOL SUCCINATE ER 25 MG PO TB24
75.0000 mg | ORAL_TABLET | Freq: Every day | ORAL | Status: DC
Start: 2015-05-04 — End: 2015-09-15

## 2015-05-04 MED ORDER — FUROSEMIDE 40 MG PO TABS: 40.0000 mg | ORAL_TABLET | Freq: Every day | ORAL | Status: DC

## 2015-05-04 NOTE — Progress Notes (Signed)
SATURATION QUALIFICATIONS: (This note is used to comply with regulatory documentation for home oxygen)  Patient Saturations on Room Air at Rest = 91%  Patient Saturations on Room Air while Ambulating = 89%  Patient Saturations on 2 Liters of oxygen while Ambulating = 97%  :

## 2015-05-04 NOTE — Progress Notes (Signed)
Patient ID: Kathleen [name], female   DOB: 09/23/1924, 82 y.o.   MRN: [id number]     Subjective:    SOB improving  Objective:   Temp:  [97.8 F (36.6 C)-98.3 F (36.8 C)] 97.8 F (36.6 C) (04/21 0517) Pulse Rate:  [70-71] 71 (04/21 0517) Resp:  [16-18] 18 (04/21 0517) BP: (132-146)/(63-64) 146/64 mmHg (04/21 0517) SpO2:  [98 %-99 %] 98 % (04/21 0517) Weight:  [140 lb 1.6 oz (63.549 kg)] 140 lb 1.6 oz (63.549 kg) (04/21 0517) Last BM Date: 05/02/15  Filed Weights   05/01/15 0005 05/02/15 0535 05/04/15 0517  Weight: 146 lb 6.4 oz (66.407 kg) 146 lb 3 oz (66.31 kg) 140 lb 1.6 oz (63.549 kg)    Intake/Output Summary (Last 24 hours) at 05/04/15 1038 Last data filed at 05/04/15 0517  Gross per 24 hour  Intake    723 ml  Output   1750 ml  Net  -1027 ml    Telemetry: AV paced.   Exam:  General: NAD  HEENT: sclera clear, throat clear  Resp: mild crackles bilateral bases  Cardiac: RRR, no m/r/g, no jvd  GI: abdomen soft, NT, ND  MSK: no LE edema  Neuro: no focal deficits  Psych: appropriate affect  Lab Results:  Basic Metabolic Panel:  Recent Labs Lab 04/30/15 2050 05/02/15 0621 05/03/15 0648  NA 131* 135 136  K 3.3* 4.3 4.1  CL 96* 99* 95*  CO2 26 30 33*  GLUCOSE 209* 137* 129*  BUN CREATININE 1.43* 1.12* 1.20*  CALCIUM 8.4* 9.2 9.0    Liver Function Tests: No results for input(s): AST, ALT, ALKPHOS, BILITOT, PROT, ALBUMIN in the last 168 hours.  CBC:  Recent Labs Lab 04/30/15 2050  WBC 8.5  HGB 12.3  HCT 36.7  MCV 97.6  PLT 263    Cardiac Enzymes:  Recent Labs Lab 05/01/15 0010 05/01/15 0256 05/01/15 0556  TROPONINI <0.03 <0.03 <0.03    BNP: No results for input(s): PROBNP in the last 8760 hours.  Coagulation: No results for input(s): INR in the last 168 hours.  ECG:   Medications:   Scheduled Medications: . amiodarone  200 mg Oral Daily  . apixaban  2.5 mg Oral BID  . feeding supplement (ENSURE ENLIVE)   237 mL Oral BID BM  . furosemide  40 mg Intravenous BID  . metoprolol succinate  75 mg Oral Daily  . potassium chloride  20 mEq Oral Daily  . pravastatin  40 mg Oral Daily  . sodium chloride flush  3 mL Intravenous Q12H    Infusions:    PRN Medications: sodium chloride, acetaminophen, HYDROcodone-acetaminophen, ondansetron (ZOFRAN) IV, sodium chloride flush     Assessment/Plan   1. Acute on chronic combined heart failure - 04/2015 echo LVEF 40-45%, restrictive diastolic dysfunction, inferolateral akinesis, mod MR - negative 1.4 L yesterday, negative 2.2 liters since admission. She is on lasix  IV bid, ovearall stable renal function trends, labs still pending for today. Weight 140 lbs, from clinic note 10/2014 and 02/2015 baseline around 153-156 lbs. She reports her home weights are typically around 146 lbs - nearing euvolemia, we will have her ambulate again with nursing staff today to see how her symptoms are. Depending on symptoms can consider discharge today or tomorrow, I would resume her lasix  prn at discharge.   2. PAF - she is on amio, Toprol XL, and eliquis - no current symptoms  3. CKD 3 - renal function overall  stable.    Dina Rich MD Dina Rich, M.D.

## 2015-05-04 NOTE — Discharge Summary (Signed)
Physician Discharge Summary  Kathleen [name]:[id number] DOB: 1924-12-31 DOA: 04/30/2015  PCP: [name]., MD  Admit date: 04/30/2015 Discharge date: 05/04/2015  Time spent: 45 minutes  Recommendations for Outpatient Follow-up:  -We'll be discharged home today. -Advised to follow-up with primary care provider in one week.   Discharge Diagnoses:  Principal Problem:   Chest pain Active Problems:   Hypertension   DM type 2 (diabetes mellitus, type 2) (HCC)   Paroxysmal a-fib (HCC)   PPM-Medtronic   Chronic combined systolic and diastolic CHF (congestive heart failure) (HCC)   CKD (chronic kidney disease) stage 3, GFR 30-59 ml/min   Acute on chronic diastolic CHF (congestive heart failure) (HCC)   Pain in the chest   Acute on chronic combined systolic and diastolic CHF, NYHA class 1 (HCC)   Discharge Condition: Stable and improved  Filed Weights   05/01/15 0005 05/02/15 0535 05/04/15 0517  Weight: 66.407 kg (146 lb 6.4 oz) 66.31 kg (146 lb 3 oz) 63.549 kg (140 lb 1.6 oz)    History of present illness:  As per Dr. Onalee Hua on 4/18: 82 yo female h/o NICM, afib, CKD, CAD, pacemaker placed for AV block 2nd degree comes in with episode of sscp that last for over 2 hours and radiated to her jaw that she says was relieved by "relaxing" in the ED. She did receive several doses of ntg sublingual thru the day. Pt is now cp free. She reports associated sob with it. She denies fevers. No cough. No weight gain. Pt is poor historian but family member is present and also adds that she has been having these "episodes" several times a month for many months. Pt did have a heart cath about 4 years ago which did not show any significant CAD. Pt referred for admission for her chest pain.  Hospital Course:   Acute on chronic combined CHF -Volume status is improved. -Plan to treat with Lasix 40 mg daily at home to manage volume status. -She is 2.2 L negative since admission.   -Today she has no oxygen requirements as compared to yesterday, indicating improved volume status.  Paroxysmal atrial fibrillation -Is on Eliquis for anticoagulation. -Is currently maintaining sinus rhythm on amiodarone.  Acute on chronic Stage III chronic kidney disease -Creatinine is improved down to 1.1-1.2 which is her baseline.  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  Chest pain -Resolved, suspect related to CHF. -Serial troponins have been negative, EKG does not demonstrate acute ischemic abnormalities.  Hypokalemia -Due to diuresis, repleted.   Procedures:  None   Consultations:  Cardiology, Dr. branch  Discharge Instructions  Discharge Instructions    Diet - low sodium heart healthy    Complete by:  As directed      Increase activity slowly    Complete by:  As directed             Medication List    STOP taking these medications        diltiazem 240 MG 24 hr capsule  Commonly known as:  CARDIZEM CD     metoprolol tartrate 25 MG tablet  Commonly known as:  LOPRESSOR      TAKE these medications        amiodarone 200 MG tablet  Commonly known as:  PACERONE  TAKE 1 TABLET BY MOUTH ONCE DAILY.     CENTRUM SILVER PO  Take 1 tablet  by mouth every morning.     CO Q 10 PO  Take 1 tablet by mouth daily.     ELIQUIS 2.5 MG Tabs tablet  Generic drug:  apixaban  TAKE ONE TABLET BY MOUTH TWICE DAILY.     fluocinonide-emollient 0.05 % cream  Commonly known as:  LIDEX-E  Apply 1 application topically daily as needed. For skin irritation     furosemide 40 MG tablet  Commonly known as:  LASIX  Take 1 tablet (40 mg total) by mouth daily.     glimepiride 1 MG tablet  Commonly known as:  AMARYL  Take 0.5 tablets (0.5 mg total) by mouth daily with breakfast.     HYDROcodone-acetaminophen 5-325 MG tablet   Commonly known as:  NORCO/VICODIN  Take 0.5-1 tablets by mouth every 4 (four) hours as needed for severe pain.     lubiprostone 8 MCG capsule  Commonly known as:  AMITIZA  Take 1 capsule (8 mcg total) by mouth 2 (two) times daily.     meclizine 25 MG tablet  Commonly known as:  ANTIVERT  Take 25 mg by mouth 2 (two) times daily as needed for dizziness or nausea.     metoprolol succinate 25 MG 24 hr tablet  Commonly known as:  TOPROL-XL  Take 3 tablets (75 mg total) by mouth daily.     nitroGLYCERIN 0.4 MG SL tablet  Commonly known as:  NITROSTAT  DISSOLVE 1 TABLET UNDER TONGUE EVERY 5 MINUTES UP TO 15 MIN FOR CHESTPAIN. IF NO RELIEF CALL 911.     omeprazole 20 MG capsule  Commonly known as:  PRILOSEC  TAKE ONE CAPSULE BY MOUTH DAILY.     ondansetron 4 MG tablet  Commonly known as:  ZOFRAN  Take 4 mg by mouth every 4 (four) hours as needed for nausea or vomiting.     potassium chloride SA 20 MEQ tablet  Commonly known as:  K-DUR,KLOR-CON  TAKE 1 TABLET BY MOUTH ONCE DAILY.     pravastatin 40 MG tablet  Commonly known as:  PRAVACHOL  Take 1 tablet (40 mg total) by mouth daily.       Allergies  Allergen Reactions  . Coumadin [Warfarin Sodium] Other (See Comments)    Caused Patient to Bleed Out.   . Meloxicam Other (See Comments)    Unknown  . Metformin And Related Other (See Comments)    Cannot have due to kidney function  . Morphine Itching    Not in right state of mind.   . Nabumetone Nausea And Vomiting  . Oxycodone Nausea And Vomiting  . Sulfonamide Derivatives Hives       Follow-up Information    Follow up with [name]., MD. Schedule an appointment as soon as possible for a visit in 1 week.   Specialty:  Internal Medicine   Contact information:   [address] [place] [address] [phone]        The results of significant diagnostics from this hospitalization (including imaging, microbiology, ancillary and laboratory) are listed  below for reference.    Significant Diagnostic Studies: Dg Chest 2 View  05/03/2015  CLINICAL DATA:  Increased shortness of breast since Sunday, chronic nonproductive cough worse at night when lying down, tight 2 diabetes mellitus, hypertension, coronary artery disease, paroxysmal atrial fibrillation, chronic kidney disease EXAM: CHEST  2 VIEW COMPARISON:  04/30/2015 FINDINGS: LEFT subclavian pacemaker leads project at RIGHT atrium, RIGHT ventricle and coronary sinus. Enlargement of cardiac silhouette with pulmonary vascular congestion.  Atherosclerotic calcification aorta. Mediastinal contours normal. Emphysematous and bronchitic changes consistent with COPD. Bibasilar effusions and atelectasis. Linear atelectasis RIGHT mid lung. Biapical scarring with peripheral opacities in RIGHT upper lobe probably representing scarring. No definite acute infiltrate or pneumothorax. Diffuse osseous demineralization. IMPRESSION: Enlargement of cardiac silhouette with slight vascular congestion post pacemaker. COPD changes with extensive scarring, bibasilar effusions and bibasal atelectasis. Electronically Signed   By: Ulyses Southward M.D.   On: 05/03/2015 18:43   Dg Chest 2 View  04/30/2015  CLINICAL DATA:  Upper chest pain radiating to jaw EXAM: CHEST  2 VIEW COMPARISON:  10/27/2014 FINDINGS: Chronic interstitial markings/emphysematous changes. Biapical pleural-parenchymal scarring. Small bilateral pleural effusions.  No pneumothorax. Cardiomegaly.  Left subclavian pacemaker. Mild degenerative changes of the visualized thoracolumbar spine. IMPRESSION: Small bilateral pleural effusions. Chronic interstitial markings/emphysematous changes. Electronically Signed   By: Charline Bills M.D.   On: 04/30/2015 22:29    Microbiology: No results found for this or any previous visit (from the past 240 hour(s)).   Labs: Basic Metabolic Panel:  Recent Labs Lab 04/30/15 2050 05/02/15 0621 05/03/15 0648 05/04/15 1055  NA  131* 135 136 134*  K 3.3* 4.3 4.1 4.0  CL 96* 99* 95* 90*  CO2 26 30 33* 33*  GLUCOSE 209* 137* 129* 233*  BUN 13 12 15  21*  CREATININE 1.43* 1.12* 1.20* 1.47*  CALCIUM 8.4* 9.2 9.0 9.2  MG  --   --   --  1.7   Liver Function Tests: No results for input(s): AST, ALT, ALKPHOS, BILITOT, PROT, ALBUMIN in the last 168 hours. No results for input(s): LIPASE, AMYLASE in the last 168 hours. No results for input(s): AMMONIA in the last 168 hours. CBC:  Recent Labs Lab 04/30/15 2050  WBC 8.5  HGB 12.3  HCT 36.7  MCV 97.6  PLT 263   Cardiac Enzymes:  Recent Labs Lab 04/30/15 2050 05/01/15 0010 05/01/15 0256 05/01/15 0556  TROPONINI <0.03 <0.03 <0.03 <0.03   BNP: BNP (last 3 results)  Recent Labs  10/27/14 1100 04/30/15 2050  BNP 530.0* 589.0*    ProBNP (last 3 results) No results for input(s): PROBNP in the last 8760 hours.  CBG:  Recent Labs Lab 05/02/15 1802  GLUCAP 173*       Signed:  Chaya Jan  [facility] Pager: [phone] 05/04/2015, 2:07 PM

## 2015-05-04 NOTE — Progress Notes (Signed)
Dr. Ardyth Harps has been in to see pt and discuss discharge w/pt and her daughter.  VS are stable, IV catheter removed from left wrist WNL & bleeding controlled, discharge instructions and medications reviewed w/pt and daughter.  They have no questions at this time.  Pt is discharged home.

## 2015-05-04 NOTE — Care Management Important Message (Signed)
Important Message  Patient Details  Name: Kathleen [name] MRN: [id number] Date of Birth: [birth year]   Medicare Important Message Given:  Yes    Adonis Huguenin, RN 05/04/2015, 11:59 AM

## 2015-05-04 NOTE — Care Management Note (Signed)
Case Management Note  Patient Details  Name: Kathleen [name] MRN: [id number] Date of Birth: 02-08-1924  Subjective/Objective:                    Action/Plan: Home with O2 setup with [facility].    Expected Discharge Date:                  Expected Discharge Plan:  Home w Home Health Services  In-House Referral:     Discharge planning Services  CM Consult  Post Acute Care Choice:  Resumption of Svcs/PTA Provider Choice offered to:  Patient  DME Arranged:  Oxygen DME Agency:  [facility]  HH Arranged:    HH Agency:     Status of Service:  Completed, signed off  Medicare Important Message Given:    Date Medicare IM Given:    Medicare IM give by:    Date Additional Medicare IM Given:    Additional Medicare Important Message give by:     If discussed at Long Length of Stay Meetings, dates discussed:    Additional Comments:  Adonis Huguenin, RN 05/04/2015, 11:50 AM

## 2015-05-07 ENCOUNTER — Other Ambulatory Visit: Payer: Self-pay | Admitting: Cardiovascular Disease

## 2015-05-07 NOTE — Telephone Encounter (Signed)
Rx(s) sent to pharmacy electronically.  

## 2015-05-08 ENCOUNTER — Ambulatory Visit (INDEPENDENT_AMBULATORY_CARE_PROVIDER_SITE_OTHER): Payer: PPO | Admitting: Cardiovascular Disease

## 2015-05-08 VITALS — BP 140/73 | HR 71 | Ht 68.0 in | Wt 140.0 lb

## 2015-05-08 DIAGNOSIS — I441 Atrioventricular block, second degree: Secondary | ICD-10-CM | POA: Diagnosis not present

## 2015-05-08 DIAGNOSIS — I420 Dilated cardiomyopathy: Secondary | ICD-10-CM

## 2015-05-08 DIAGNOSIS — Z950 Presence of cardiac pacemaker: Secondary | ICD-10-CM

## 2015-05-08 DIAGNOSIS — I480 Paroxysmal atrial fibrillation: Secondary | ICD-10-CM | POA: Diagnosis not present

## 2015-05-08 DIAGNOSIS — I429 Cardiomyopathy, unspecified: Secondary | ICD-10-CM | POA: Diagnosis not present

## 2015-05-08 DIAGNOSIS — I5042 Chronic combined systolic (congestive) and diastolic (congestive) heart failure: Secondary | ICD-10-CM

## 2015-05-08 MED ORDER — POTASSIUM CHLORIDE ER 8 MEQ PO TBCR: 8.0000 meq | EXTENDED_RELEASE_TABLET | Freq: Three times a day (TID) | ORAL | Status: DC

## 2015-05-08 NOTE — Progress Notes (Signed)
Patient ID: Kathleen [name], female   DOB: March 06, 1924, 82 y.o.   MRN: [id number]    Cardiology Office Note    Date:  05/12/2015   ID:  Kathleen [name], DOB 30-Mar-1924, MRN [id number]  PCP:  [name]., MD  Cardiologist:   Thurmon Fair, MD   Chief Complaint  Patient presents with  . Congestive Heart Failure    pt was in [facility] for CHF  . Fatigue    pt c/o weakness and no energy    History of Present Illness:  Kathleen [name] is a 82 y.o. female with long-standing nonischemic cardiomyopathy, high-grade AV block requiring permanent pacemaker therapy (upgrade to biventricular pacemaker after she developed congestive heart failure, excellent response to CRT), paroxysmal atrial fibrillation on chronic amiodarone and Eliquis therapy, hyperlipidemia, and in for routine follow-up.   In January she fell and suffered a pelvic fracture and a right wrist fracture. She is still recovering from that injury. Her appetite is poor and she has lost a lot of weight.   In mid April she had an episode of heart failure exacerbation and required hospitalization for about 4 days. She presented with chest discomfort that improved with diuretic therapy. Apparently, the cause of her heart for exacerbation was noncompliance with diuretics, since it has been difficult for her to use the restroom after her injuries. Irrigation of her pacemaker today shows that her thoracic impedance was dropping as early as mid-March, when she began skipping her doses of diuretics. She does not have coronary artery disease by previous angiography and her cardiac enzymes remain normal throughout her recent hospital stay. At hospital discharge she weighed 140 pounds (at least 6 pounds less than on admission). She weighs exactly the same on our office scale today.  She is doing much better. She has lost a lot of weight and is now just barely above a BMI of 21. Her appetite remains poor, but she denies dyspnea. She  remains very sedentary. She has not expressed syncope and denies palpitations.  Interrogation of her pacemaker shows 100% biventricular pacing. She also has nearly 100% atrial pacing. Her heart rate histogram is quite flat, reflecting her sedentary status. She has not had any recent atrial fibrillation or ventricular high rates. Her thoracic impedance (optivol) has returned to baseline. Her recent ECG shows a prominent R wave in lead V1 consistent with continued appropriate left ventricular pacing. Estimated generator longevity is about another 4 years.  Past Medical History  Diagnosis Date  . Essential hypertension   . Type 2 diabetes mellitus (HCC)   . Hyperlipidemia   . PAF (paroxysmal atrial fibrillation) (HCC)   . History of pneumonia   . CAD (coronary artery disease)   . Acid reflux   . AV block, 2nd degree     S/p Medtronic pacemaker 03/2011  . Nonischemic cardiomyopathy (HCC)   . Chronic kidney disease (CKD), stage III (moderate)     Past Surgical History  Procedure Laterality Date  . Replacement total knee    . Bladder tack  1970's  . Pacemaker insertion  10/16/2011    Medtronic  . Cholecystectomy  1999  . Nm myocar perf wall motion  03/15/2009    Normal  . Cardiac catheterization  10/15/2011    Normal coronaries  . Cardioversion N/A 10/28/2013    Procedure: CARDIOVERSION;  Surgeon: Thurmon Fair, MD;  Location: [facility] ENDOSCOPY;  Service: Cardiovascular;  Laterality: N/A;  . Permanent pacemaker insertion N/A 03/24/2011    Procedure: PERMANENT  PACEMAKER INSERTION;  Surgeon: Thurmon Fair, MD;  Location: [facility] CATH LAB;  Service: Cardiovascular;  Laterality: N/A;  . Left and right heart catheterization with coronary angiogram N/A 10/15/2011    Procedure: LEFT AND RIGHT HEART CATHETERIZATION WITH CORONARY ANGIOGRAM;  Surgeon: Chrystie Nose, MD;  Location: [facility] CATH LAB;  Service: Cardiovascular;  Laterality: N/A;    Current Medications: Outpatient Prescriptions Prior to Visit    Medication Sig Dispense Refill  . amiodarone (PACERONE) 200 MG tablet TAKE 1 TABLET BY MOUTH ONCE DAILY. 30 tablet 3  . Coenzyme Q10 (CO Q 10 PO) Take 1 tablet by mouth daily.    Marland Kitchen ELIQUIS 2.5 MG TABS tablet TAKE ONE TABLET BY MOUTH TWICE DAILY. 60 tablet 3  . fluocinonide-emollient (LIDEX-E) 0.05 % cream Apply 1 application topically daily as needed. For skin irritation    . furosemide (LASIX) 40 MG tablet Take 1 tablet (40 mg total) by mouth daily. 30 tablet 2  . glimepiride (AMARYL) 1 MG tablet Take 0.5 tablets (0.5 mg total) by mouth daily with breakfast. (Patient taking differently: Take 0.5 mg by mouth daily as needed (for blood sugar levels over 120). ) 30 tablet 1  . HYDROcodone-acetaminophen (NORCO/VICODIN) 5-325 MG tablet Take 0.5-1 tablets by mouth every 4 (four) hours as needed for severe pain. 15 tablet 0  . lubiprostone (AMITIZA) 8 MCG capsule Take 1 capsule (8 mcg total) by mouth 2 (two) times daily. 60 capsule 5  . meclizine (ANTIVERT) 25 MG tablet Take 25 mg by mouth 2 (two) times daily as needed for dizziness or nausea.     . metoprolol succinate (TOPROL-XL) 25 MG 24 hr tablet Take 3 tablets (75 mg total) by mouth daily. 90 tablet 2  . Multiple Vitamins-Minerals (CENTRUM SILVER PO) Take 1 tablet by mouth every morning.     . nitroGLYCERIN (NITROSTAT) 0.4 MG SL tablet DISSOLVE 1 TABLET UNDER TONGUE EVERY 5 MINUTES UP TO 15 MIN FOR CHESTPAIN. IF NO RELIEF CALL 911. 25 tablet 4  . omeprazole (PRILOSEC) 20 MG capsule TAKE ONE CAPSULE BY MOUTH DAILY. 30 capsule 1  . ondansetron (ZOFRAN) 4 MG tablet Take 4 mg by mouth every 4 (four) hours as needed for nausea or vomiting.    . pravastatin (PRAVACHOL) 40 MG tablet Take 1 tablet (40 mg total) by mouth daily. 30 tablet 8  . potassium chloride SA (K-DUR,KLOR-CON) 20 MEQ tablet TAKE 1 TABLET BY MOUTH ONCE DAILY. (Patient taking differently: TAKE 1 TABLET BY MOUTH ONCE DAILY. with the Fluid pill) 30 tablet 10   No facility-administered  medications prior to visit.     Allergies:   Coumadin; Meloxicam; Metformin and related; Morphine; Nabumetone; Oxycodone; and Sulfonamide derivatives   Social History   Social History  . Marital Status: Widowed    Spouse Name: N/A  . Number of Children: N/A  . Years of Education: N/A   Social History Main Topics  . Smoking status: Never Smoker   . Smokeless tobacco: Never Used  . Alcohol Use: No  . Drug Use: No  . Sexual Activity: Not Currently   Other Topics Concern  . Not on file   Social History Narrative     Family History:  The patient's family history includes Heart attack in her brother; Heart murmur in her daughter; Suicidality in her mother.   ROS:   Please see the history of present illness.    ROS All other systems reviewed and are negative.   PHYSICAL EXAM:   VS:  BP 140/73 mmHg  Pulse 71  Ht 5\' 8"  (1.727 m)  Wt 63.504 kg (140 lb)  BMI 21.29 kg/m2   GEN: Well nourished, well developed, in no acute distress, she appears more frail than in the past and is very slender HEENT: normal Neck: no JVD, carotid bruits, or masses Cardiac: Her.sickly split second heart sound, RRR; no murmurs, rubs, or gallops,no edema , healthy left subclavian pacemaker site Respiratory:  clear to auscultation bilaterally, normal work of breathing GI: soft, nontender, nondistended, + BS MS: no deformity or atrophy Skin: warm and dry, no rash Neuro:  Alert and Oriented x 3, Strength and sensation are intact Psych: euthymic mood, full affect  Wt Readings from Last 3 Encounters:  05/08/15 63.504 kg (140 lb)  05/04/15 63.549 kg (140 lb 1.6 oz)  04/04/15 69.31 kg (152 lb 12.8 oz)      Studies/Labs Reviewed:   EKG:  EKG is not ordered today.  The atrial cardiac electrogram demonstrates AV sequential pacing  Recent Labs: 01/03/2015: ALT 39* 04/30/2015: B Natriuretic Peptide 589.0*; Hemoglobin 12.3; Platelets 263 05/04/2015: BUN 21*; Creatinine, Ser 1.47*; Magnesium 1.7;  Potassium 4.0; Sodium 134*   Lipid Panel    Component Value Date/Time   CHOL 151 04/11/2014 0820   TRIG 108 04/11/2014 0820   HDL 60 04/11/2014 0820   CHOLHDL 2.5 04/11/2014 0820   VLDL 22 04/11/2014 0820   LDLCALC 69 04/11/2014 0820    Additional studies/ records that were reviewed today include:  Records from hospitalization at [facility], imaging studies    ASSESSMENT:    1. Chronic combined systolic and diastolic CHF (congestive heart failure) (HCC)   2. Cardiomyopathy, dilated, nonischemic   3. Symptomatic advanced heart block, 2:1 AV block, RT BBB, Lt post. fas. block.(March 2013)   4. CRT - pacemaker Medtronic   5. Paroxysmal a-fib (HCC)      PLAN:  In order of problems listed above:  1. CHF: Both by physical exam and by thoracic impedance measurements, she now appears to be at euvolemic status. She weighs 10 pounds less than her previous estimated dry weight, due to anorexia and weight loss. One wonders to what degree her poor appetite may have been related to chronic hepatic congestion. Recent liver function tests were however minimally abnormal. She has difficulty swallowing the large potassium chloride pills and will switch her to a smaller caplet. I think she would be a good candidate for enrollment in the heart failure device clinic, with monthly downloads of her optivol readings. 2. CMP: Echo performed in to her recent hospitalization shows an ejection fraction of 40-45 percent and showed evidence of restrictive filling consistent with severely elevated filling pressures. He has a severely dilated left atrium and moderate mitral insufficiency, likely secondary to the cardiomyopathy and volume overload 3. High grade AV block: Over the years and with added treatment with amiodarone, she has progressed to essentially complete heart block. She should be considered pacemaker dependent. 4. CRT-P: Normal function of her dual-chamber biventricular pacemaker. All lead  parameters are in normal range. Thoracic impedance has returned to baseline. Continue remote downloads every 3 months. 5. AFib: None recorded recently. She is at high embolic risk and is on appropriate anticoagulation, adjusted for age and small body size. CHADSVasc 5 (age 79, gender, HF, HTN). On long-term amiodarone. If her appetite does not improve, consider discontinuing the amiodarone, although her recent deterioration seems to be related to her fall and injury. Need to recheck TSH.  Medication Adjustments/Labs and Tests Ordered: Current medicines are reviewed at length with the patient today.  Concerns regarding medicines are outlined above.  Medication changes, Labs and Tests ordered today are listed in the Patient Instructions below. Patient Instructions  Your physician has recommended you make the following change in your medication:  1- CHANGE to Potassium Chloride 8 mEq by mouth three times a day. Or you may take 3 tablets by mouth once a day.   Your physician recommends that you schedule a follow-up appointment in: 1 month.  Dr Royann Shivers wants you to start being seen by the [facility] with Randon Goldsmith to perform monthly downloads from your device. Someone will contact you.     Joie Bimler, MD  05/12/2015 6:29 PM    [facility] [address] [place], [place], [place]  [address] Phone: [phone]; Fax: [phone]

## 2015-05-08 NOTE — Patient Instructions (Signed)
Your physician has recommended you make the following change in your medication:  1- CHANGE to Potassium Chloride 8 mEq by mouth three times a day. Or you may take 3 tablets by mouth once a day.   Your physician recommends that you schedule a follow-up appointment in: 1 month.  Dr Royann Shivers wants you to start being seen by the [facility] with Randon Goldsmith to perform monthly downloads from your device. Someone will contact you.

## 2015-05-11 DIAGNOSIS — I5033 Acute on chronic diastolic (congestive) heart failure: Secondary | ICD-10-CM | POA: Diagnosis not present

## 2015-05-11 DIAGNOSIS — E782 Mixed hyperlipidemia: Secondary | ICD-10-CM | POA: Diagnosis not present

## 2015-05-11 DIAGNOSIS — I10 Essential (primary) hypertension: Secondary | ICD-10-CM | POA: Diagnosis not present

## 2015-05-11 DIAGNOSIS — Z1389 Encounter for screening for other disorder: Secondary | ICD-10-CM | POA: Diagnosis not present

## 2015-05-11 DIAGNOSIS — E1129 Type 2 diabetes mellitus with other diabetic kidney complication: Secondary | ICD-10-CM | POA: Diagnosis not present

## 2015-05-11 DIAGNOSIS — I255 Ischemic cardiomyopathy: Secondary | ICD-10-CM | POA: Diagnosis not present

## 2015-05-11 DIAGNOSIS — R079 Chest pain, unspecified: Secondary | ICD-10-CM | POA: Diagnosis not present

## 2015-05-11 DIAGNOSIS — I4891 Unspecified atrial fibrillation: Secondary | ICD-10-CM | POA: Diagnosis not present

## 2015-05-11 DIAGNOSIS — J449 Chronic obstructive pulmonary disease, unspecified: Secondary | ICD-10-CM | POA: Diagnosis not present

## 2015-05-11 DIAGNOSIS — Z6821 Body mass index (BMI) 21.0-21.9, adult: Secondary | ICD-10-CM | POA: Diagnosis not present

## 2015-05-12 ENCOUNTER — Encounter: Payer: Self-pay | Admitting: Cardiovascular Disease

## 2015-05-17 ENCOUNTER — Other Ambulatory Visit: Payer: Self-pay | Admitting: Cardiovascular Disease

## 2015-05-17 ENCOUNTER — Telehealth: Payer: Self-pay

## 2015-05-17 NOTE — Telephone Encounter (Signed)
Patient referred to [facility] clinic by Dr Royann Shivers.  Call to patient and provided ICM introduction and explained will contact her monthly regarding fluid levels.  She stated that would be good idea and agreed to monthly follow up.  She reported when she has fluid symptoms it is usually leg swelling and trouble breathing when lying down at night.  She takes Lasix and potassium prn.   She denied any current symptoms. She does not weigh daily.  She lives with daughter.   ICM remote transmission scheduled for 05/29/2015 and office appointment with Dr Royann Shivers on 06/08/2015

## 2015-05-17 NOTE — Telephone Encounter (Signed)
Rx refill sent to pharmacy. 

## 2015-05-21 ENCOUNTER — Telehealth: Payer: Self-pay

## 2015-05-21 DIAGNOSIS — I4891 Unspecified atrial fibrillation: Secondary | ICD-10-CM

## 2015-05-21 NOTE — Telephone Encounter (Signed)
Called patient with recommendations. Patient will have lab drawn next week.  TSH ordered.

## 2015-05-21 NOTE — Telephone Encounter (Signed)
-----   Message from Thurmon Fair, MD sent at 05/12/2015  6:41 PM EDT ----- Please check TSH. This is not urgent and can be synchronized with any other upcoming lab tests. We should get it within the next 3-4 weeks.

## 2015-05-25 DIAGNOSIS — R0602 Shortness of breath: Secondary | ICD-10-CM | POA: Diagnosis not present

## 2015-05-25 DIAGNOSIS — I509 Heart failure, unspecified: Secondary | ICD-10-CM | POA: Diagnosis not present

## 2015-05-25 DIAGNOSIS — Z6822 Body mass index (BMI) 22.0-22.9, adult: Secondary | ICD-10-CM | POA: Diagnosis not present

## 2015-05-25 DIAGNOSIS — J449 Chronic obstructive pulmonary disease, unspecified: Secondary | ICD-10-CM | POA: Diagnosis not present

## 2015-05-25 DIAGNOSIS — Z1389 Encounter for screening for other disorder: Secondary | ICD-10-CM | POA: Diagnosis not present

## 2015-05-29 ENCOUNTER — Ambulatory Visit (INDEPENDENT_AMBULATORY_CARE_PROVIDER_SITE_OTHER): Payer: PPO

## 2015-05-29 DIAGNOSIS — I5042 Chronic combined systolic (congestive) and diastolic (congestive) heart failure: Secondary | ICD-10-CM

## 2015-05-29 DIAGNOSIS — Z950 Presence of cardiac pacemaker: Secondary | ICD-10-CM | POA: Diagnosis not present

## 2015-05-29 DIAGNOSIS — I4891 Unspecified atrial fibrillation: Secondary | ICD-10-CM | POA: Diagnosis not present

## 2015-05-29 NOTE — Progress Notes (Signed)
EPIC Encounter for ICM Monitoring  Patient Name: Kathleen [name] is a 82 y.o. female Date: 05/29/2015 Primary Care Physican: [name]., MD Primary Cardiologist: Croitoru Electrophysiologist: Croitoru Dry Weight:  unknown  Bi-V Pacing 100%      In the past month, have you:  1. Gained more than 2 pounds in a day or more than 5 pounds in a week? N/A  2. Had changes in your medications (with verification of current medications)? N/A  3. Had more shortness of breath than is usual for you? N/A  4. Limited your activity because of shortness of breath? N/A  5. Not been able to sleep because of shortness of breath? N/A  6. Had increased swelling in your feet, ankles, legs or stomach area? N/A  7. Had symptoms of dehydration (dizziness, dry mouth, increased thirst, decreased urine output) N/A  8. Had changes in sodium restriction? N/A  9. Been compliant with medication? N/A  ICM trend: 3 month view for 05/29/2015  ICM trend: 1 year view for 05/29/2015   Follow-up plan: ICM clinic phone appointment 06/07/2015.   Office visit with Dr Royann Shivers on 06/08/2015.  Attempted call to patient for 1st ICM encounter and unable to reach.  Transmission reviewed.   FLUID LEVELS:  Optivol thoracic impedance decreased 05/15/2015 to 05/28/2015 suggesting fluid accumulation and returned to baseline 05/29/2015.       Karie Soda, RN, CCM 05/29/2015 1:31 PM

## 2015-05-30 LAB — TSH: TSH: 3.3 m[IU]/L

## 2015-05-31 ENCOUNTER — Other Ambulatory Visit: Payer: Self-pay | Admitting: Cardiovascular Disease

## 2015-05-31 NOTE — Telephone Encounter (Signed)
Rx request sent to pharmacy.  

## 2015-06-04 ENCOUNTER — Telehealth: Payer: Self-pay

## 2015-06-04 NOTE — Telephone Encounter (Signed)
Call to patient.  Advised attempted to contact last week for ICM transmission follow up but unable to reach her.  I explained I rescheduled ICM remote transmission for 06/07/2015 which is day before Dr Croitorus visit.  Requested she send the manual transmission Thursday morning.  She has an eye appointment around 2 pm.  She stated she would send.

## 2015-06-05 LAB — CUP PACEART INCLINIC DEVICE CHECK
Battery Voltage: 2.99 V
Brady Statistic AP VP Percent: 99.78 %
Brady Statistic AP VS Percent: 0.01 %
Brady Statistic AS VP Percent: 0.21 %
Brady Statistic AS VS Percent: 0 %
Date Time Interrogation Session: 20170425213718
Implantable Lead Implant Date: 20130311
Implantable Lead Implant Date: 20131003
Implantable Lead Location: 753860
Implantable Lead Model: 5086
Lead Channel Impedance Value: 342 Ohm
Lead Channel Impedance Value: 380 Ohm
Lead Channel Impedance Value: 437 Ohm
Lead Channel Impedance Value: 646 Ohm
Lead Channel Pacing Threshold Amplitude: 1 V
Lead Channel Pacing Threshold Amplitude: 1.125 V
Lead Channel Pacing Threshold Pulse Width: 0.4 ms
Lead Channel Pacing Threshold Pulse Width: 0.4 ms
Lead Channel Pacing Threshold Pulse Width: 0.4 ms
Lead Channel Sensing Intrinsic Amplitude: 22.625 mV
Lead Channel Sensing Intrinsic Amplitude: 22.625 mV
Lead Channel Setting Pacing Amplitude: 2.25 V
Lead Channel Setting Pacing Pulse Width: 0.4 ms
Lead Channel Setting Pacing Pulse Width: 0.4 ms
MDC IDC LEAD IMPLANT DT: 20130311
MDC IDC LEAD LOCATION: 753858
MDC IDC LEAD LOCATION: 753859
MDC IDC LEAD MODEL: 4194
MDC IDC LEAD MODEL: 5086
MDC IDC MSMT BATTERY REMAINING LONGEVITY: 45 mo
MDC IDC MSMT LEADCHNL LV IMPEDANCE VALUE: 285 Ohm
MDC IDC MSMT LEADCHNL LV IMPEDANCE VALUE: 494 Ohm
MDC IDC MSMT LEADCHNL LV IMPEDANCE VALUE: 627 Ohm
MDC IDC MSMT LEADCHNL RA IMPEDANCE VALUE: 437 Ohm
MDC IDC MSMT LEADCHNL RA SENSING INTR AMPL: 1.625 mV
MDC IDC MSMT LEADCHNL RA SENSING INTR AMPL: 1.625 mV
MDC IDC MSMT LEADCHNL RV IMPEDANCE VALUE: 494 Ohm
MDC IDC MSMT LEADCHNL RV PACING THRESHOLD AMPLITUDE: 1.375 V
MDC IDC SET LEADCHNL RA PACING AMPLITUDE: 2 V
MDC IDC SET LEADCHNL RV PACING AMPLITUDE: 2.75 V
MDC IDC SET LEADCHNL RV SENSING SENSITIVITY: 4 mV
MDC IDC STAT BRADY RA PERCENT PACED: 99.79 %
MDC IDC STAT BRADY RV PERCENT PACED: 99.99 %

## 2015-06-06 ENCOUNTER — Other Ambulatory Visit: Payer: Self-pay | Admitting: Cardiovascular Disease

## 2015-06-07 ENCOUNTER — Ambulatory Visit: Payer: PPO

## 2015-06-07 DIAGNOSIS — H353132 Nonexudative age-related macular degeneration, bilateral, intermediate dry stage: Secondary | ICD-10-CM | POA: Diagnosis not present

## 2015-06-07 DIAGNOSIS — Z950 Presence of cardiac pacemaker: Secondary | ICD-10-CM

## 2015-06-07 DIAGNOSIS — I5042 Chronic combined systolic (congestive) and diastolic (congestive) heart failure: Secondary | ICD-10-CM

## 2015-06-07 DIAGNOSIS — H353222 Exudative age-related macular degeneration, left eye, with inactive choroidal neovascularization: Secondary | ICD-10-CM | POA: Diagnosis not present

## 2015-06-07 DIAGNOSIS — E119 Type 2 diabetes mellitus without complications: Secondary | ICD-10-CM | POA: Diagnosis not present

## 2015-06-07 DIAGNOSIS — H31019 Macula scars of posterior pole (postinflammatory) (post-traumatic), unspecified eye: Secondary | ICD-10-CM | POA: Diagnosis not present

## 2015-06-07 NOTE — Progress Notes (Signed)
EPIC Encounter for ICM Monitoring  Patient Name: Kathleen [name] is a 82 y.o. female Date: 06/07/2015 Primary Care Physican: [name]., MD Primary Cardiologist: Croitoru Electrophysiologist: Croitoru Dry Weight: unknown  Bi-V Pacing 100%      In the past month, have you:  1. Gained more than 2 pounds in a day or more than 5 pounds in a week? no  2. Had changes in your medications (with verification of current medications)? no  3. Had more shortness of breath than is usual for you? no  4. Limited your activity because of shortness of breath? no  5. Not been able to sleep because of shortness of breath? no  6. Had increased swelling in your feet, ankles, legs or stomach area? no  7. Had symptoms of dehydration (dizziness, dry mouth, increased thirst, decreased urine output) no  8. Had changes in sodium restriction? no  9. Been compliant with medication? Yes  ICM trend: 3 month view for 06/07/2015   ICM trend: 1 year view for 06/07/2015   Follow-up plan: ICM clinic phone appointment 06/19/2015 to recheck fluid levels.  1st ICM encounter.  FLUID LEVELS: Optivol thoracic impedance decreased 05/16/2015 to 05/29/2015, 05/30/2015 to 06/07/2015 and fluid index at threshold suggesting varying amounts of fluid accumulation.    SYMPTOMS:  She reported she does have some leg swelling during the day but it does go away after sleeping at night.  She reported she has SOB and her PCP is working with her to get oxygen at home. She denied  Trouble breathing when lying down.  Provided number and encouraged to call for any fluid symptoms.   EDUCATION:  Explained high sodium diet may cause fluid accumulation.  Limit sodium intake to < 2000 mg and fluid intake to 64 oz daily.     RECOMMENDATIONS: No changes today.  Advised a copy of the note with transmission will be sent to PCP and Dr Royann Shivers for review at office appointment on 06/08/2015.  Explained he will review and may any med adjustments  if needed at that time.    Karie Soda, RN, CCM 06/07/2015 1:43 PM

## 2015-06-08 ENCOUNTER — Ambulatory Visit (INDEPENDENT_AMBULATORY_CARE_PROVIDER_SITE_OTHER): Payer: PPO | Admitting: Cardiovascular Disease

## 2015-06-08 ENCOUNTER — Encounter: Payer: Self-pay | Admitting: Cardiovascular Disease

## 2015-06-08 VITALS — BP 108/59 | HR 86 | Ht 68.0 in | Wt 145.6 lb

## 2015-06-08 DIAGNOSIS — I441 Atrioventricular block, second degree: Secondary | ICD-10-CM

## 2015-06-08 DIAGNOSIS — I429 Cardiomyopathy, unspecified: Secondary | ICD-10-CM

## 2015-06-08 DIAGNOSIS — Z950 Presence of cardiac pacemaker: Secondary | ICD-10-CM | POA: Diagnosis not present

## 2015-06-08 DIAGNOSIS — I5043 Acute on chronic combined systolic (congestive) and diastolic (congestive) heart failure: Secondary | ICD-10-CM

## 2015-06-08 DIAGNOSIS — I480 Paroxysmal atrial fibrillation: Secondary | ICD-10-CM

## 2015-06-08 DIAGNOSIS — I420 Dilated cardiomyopathy: Secondary | ICD-10-CM

## 2015-06-08 NOTE — Patient Instructions (Signed)
Dr Royann Shivers has recommended making the following medication changes: 1. INCREASE Furosemide to 60 mg (1.5 tablets) daily until your comes down to 140 pounds  Your physician recommends that you weigh, daily, at the same time every day, and in the same amount of clothing. Please record your daily weights on the handout provided and bring it to your next appointment.   Dr Royann Shivers recommends that you schedule a follow-up appointment in 3 months.  If you need a refill on your cardiac medications before your next appointment, please call your pharmacy.

## 2015-06-08 NOTE — Progress Notes (Signed)
Patient ID: Kathleen [name], female   DOB: 04/22/1924, 82 y.o.   MRN: [id number] Patient ID: Kathleen [name], female   DOB: [birth year], 82 y.o.   MRN: [id number]    Cardiology Office Note    Date:  06/08/2015   ID:  Kathleen [name], DOB [birth year], MRN [id number]  PCP:  [name]., MD  Cardiologist:   Thurmon Fair, MD   Chief Complaint  Patient presents with  . Follow-up    pt c/o occasional chest discomfort; shortness of breath-occasionally when she wakes up in the morning, occasional swelling, mainly in the pm    History of Present Illness:  Kathleen [name] is a 82 y.o. female with long-standing nonischemic cardiomyopathy, high-grade AV block requiring permanent pacemaker therapy (upgrade to biventricular pacemaker after she developed congestive heart failure, excellent response to CRT), paroxysmal atrial fibrillation on chronic amiodarone and Eliquis therapy, hyperlipidemia, and in for routine follow-up. In January 2017 she fell and suffered a pelvic fracture and a right wrist fracture. She is still recovering from that injury.   Her appetite remains poor and she has lost a lot of weight. He drinks 2 Ensure a day, has a few bites of toast for breakfast, has a few crackers with peanut butter for lunch, occasionally a good dinner  In mid April she had an episode of heart failure exacerbation and required hospitalization for about 4 days. At hospital discharge she weighed 140 pounds (at least 6 pounds less than on admission). She weighs 145 pounds on our office scale today.  She is tired, but she denies dyspnea with usual activities. She remains very sedentary. She has not expressed syncope and denies palpitations.  Interrogation of her pacemaker shows 100% biventricular pacing. She also has nearly 100% atrial pacing. Her heart rate histogram is quite flat, reflecting her sedentary status. She has not had any recent atrial fibrillation or ventricular high rates. Her thoracic  impedance (optivol) had returned to baseline, but is starting to suggest gradual fluid accumulation again.   Past Medical History  Diagnosis Date  . Essential hypertension   . Type 2 diabetes mellitus (HCC)   . Hyperlipidemia   . PAF (paroxysmal atrial fibrillation) (HCC)   . History of pneumonia   . CAD (coronary artery disease)   . Acid reflux   . AV block, 2nd degree     S/p Medtronic pacemaker 03/2011  . Nonischemic cardiomyopathy (HCC)   . Chronic kidney disease (CKD), stage III (moderate)     Past Surgical History  Procedure Laterality Date  . Replacement total knee    . Bladder tack  1970's  . Pacemaker insertion  10/16/2011    Medtronic  . Cholecystectomy  1999  . Nm myocar perf wall motion  03/15/2009    Normal  . Cardiac catheterization  10/15/2011    Normal coronaries  . Cardioversion N/A 10/28/2013    Procedure: CARDIOVERSION;  Surgeon: Thurmon Fair, MD;  Location: [facility] ENDOSCOPY;  Service: Cardiovascular;  Laterality: N/A;  . Permanent pacemaker insertion N/A 03/24/2011    Procedure: PERMANENT PACEMAKER INSERTION;  Surgeon: Thurmon Fair, MD;  Location: [facility] CATH LAB;  Service: Cardiovascular;  Laterality: N/A;  . Left and right heart catheterization with coronary angiogram N/A 10/15/2011    Procedure: LEFT AND RIGHT HEART CATHETERIZATION WITH CORONARY ANGIOGRAM;  Surgeon: Chrystie Nose, MD;  Location: [facility] CATH LAB;  Service: Cardiovascular;  Laterality: N/A;    Current Medications: Outpatient Prescriptions Prior to Visit  Medication Sig Dispense Refill  .  amiodarone (PACERONE) 200 MG tablet TAKE 1 TABLET BY MOUTH ONCE DAILY. 30 tablet 10  . Coenzyme Q10 (CO Q 10 PO) Take 1 tablet by mouth daily.    Marland Kitchen ELIQUIS 2.5 MG TABS tablet TAKE ONE TABLET BY MOUTH TWICE DAILY. 60 tablet 0  . fluocinonide-emollient (LIDEX-E) 0.05 % cream Apply 1 application topically daily as needed. For skin irritation    . furosemide (LASIX) 40 MG tablet Take 1 tablet (40 mg total) by mouth  daily. 30 tablet 2  . glimepiride (AMARYL) 1 MG tablet Take 0.5 tablets (0.5 mg total) by mouth daily with breakfast. (Patient taking differently: Take 0.5 mg by mouth daily as needed (for blood sugar levels over 120). ) 30 tablet 1  . HYDROcodone-acetaminophen (NORCO/VICODIN) 5-325 MG tablet Take 0.5-1 tablets by mouth every 4 (four) hours as needed for severe pain. 15 tablet 0  . lubiprostone (AMITIZA) 8 MCG capsule Take 1 capsule (8 mcg total) by mouth 2 (two) times daily. 60 capsule 5  . meclizine (ANTIVERT) 25 MG tablet Take 25 mg by mouth 2 (two) times daily as needed for dizziness or nausea.     . metoprolol succinate (TOPROL-XL) 25 MG 24 hr tablet Take 3 tablets (75 mg total) by mouth daily. 90 tablet 2  . Multiple Vitamins-Minerals (CENTRUM SILVER PO) Take 1 tablet by mouth every morning.     . nitroGLYCERIN (NITROSTAT) 0.4 MG SL tablet DISSOLVE 1 TABLET UNDER TONGUE EVERY 5 MINUTES UP TO 15 MIN FOR CHESTPAIN. IF NO RELIEF CALL 911. 25 tablet 4  . omeprazole (PRILOSEC) 20 MG capsule TAKE ONE CAPSULE BY MOUTH DAILY. 30 capsule 0  . ondansetron (ZOFRAN) 4 MG tablet Take 4 mg by mouth every 4 (four) hours as needed for nausea or vomiting.    . potassium chloride (KLOR-CON) 8 MEQ tablet Take 1 tablet (8 mEq total) by mouth 3 (three) times daily. 270 tablet 3  . pravastatin (PRAVACHOL) 40 MG tablet TAKE 1 TABLET BY MOUTH EACH EVENING. 30 tablet 10   No facility-administered medications prior to visit.     Allergies:   Coumadin; Meloxicam; Metformin and related; Morphine; Nabumetone; Oxycodone; and Sulfonamide derivatives   Social History   Social History  . Marital Status: Widowed    Spouse Name: N/A  . Number of Children: N/A  . Years of Education: N/A   Social History Main Topics  . Smoking status: Never Smoker   . Smokeless tobacco: Never Used  . Alcohol Use: No  . Drug Use: No  . Sexual Activity: Not Currently   Other Topics Concern  . None   Social History Narrative      Family History:  The patient's family history includes Heart attack in her brother; Heart murmur in her daughter; Suicidality in her mother.   ROS:   Please see the history of present illness.    ROS All other systems reviewed and are negative.   PHYSICAL EXAM:   VS:  BP 108/59 mmHg  Pulse 86  Ht  (1.727 m)  Wt 66.044 kg (145 lb 9.6 oz)  BMI 22.14 kg/m2   GEN: Well nourished, well developed, in no acute distress, she appears more frail than in the past and is very slender HEENT: normal Neck: no JVD, carotid bruits, or masses Cardiac: Her.sickly split second heart sound, RRR; no murmurs, rubs, or gallops,no edema , healthy left subclavian pacemaker site Respiratory:  clear to auscultation bilaterally, normal work of breathing GI: soft, nontender, nondistended, + BS  MS: no deformity or atrophy Skin: warm and dry, no rash Neuro:  Alert and Oriented x 3, Strength and sensation are intact Psych: euthymic mood, full affect  Wt Readings from Last 3 Encounters:  06/08/15 66.044 kg (145 lb 9.6 oz)  05/08/15 63.504 kg (140 lb)  05/04/15 63.549 kg (140 lb 1.6 oz)      Studies/Labs Reviewed:   EKG:  EKG is not ordered today.  The atrial cardiac electrogram demonstrates AV sequential pacing  Recent Labs: 01/03/2015: ALT 39* 04/30/2015: B Natriuretic Peptide 589.0*; Hemoglobin 12.3; Platelets 263 05/04/2015: BUN 21*; Creatinine, Ser 1.47*; Magnesium 1.7; Potassium 4.0; Sodium 134* 05/29/2015: TSH 3.30   Lipid Panel    Component Value Date/Time   CHOL 151 04/11/2014 0820   TRIG 108 04/11/2014 0820   HDL 60 04/11/2014 0820   CHOLHDL 2.5 04/11/2014 0820   VLDL 22 04/11/2014 0820   LDLCALC 69 04/11/2014 0820      ASSESSMENT:    1. Acute on chronic combined systolic and diastolic CHF, NYHA class 1 (HCC)   2. Cardiomyopathy, dilated, nonischemic   3. Symptomatic advanced heart block, 2:1 AV block, RT BBB, Lt post. fas. block.(March 2013)   4. CRT - pacemaker Medtronic    5. Paroxysmal a-fib (HCC)      PLAN:  In order of problems listed above:  1. CHF: She is starting to gain weight again and her thoracic impedance is starting to show fluid accumulation. Have asked her to increase the furosemide to 60 mg daily. Once her weight decreases back 240 pounds, she will revert to the previous dose of 40 mg daily, but continue daily weights and perform the same adjustment whenever she hits the trigger of 145lb. If she continues to lose "real weight" will have to continue adjusting down the target weight. 2. CMP: Echo performed in to her recent hospitalization shows an ejection fraction of 40-45 % and showed evidence of restrictive filling consistent with severely elevated filling pressures. She has a severely dilated left atrium and moderate mitral insufficiency, likely secondary to the cardiomyopathy and volume overload 3. High grade AV block: Over the years and with added treatment with amiodarone, she has progressed to essentially complete heart block. She should be considered pacemaker dependent. 4. CRT-P: Normal function of her dual-chamber biventricular pacemaker. All lead parameters are in normal range. Thoracic impedance had returned to baseline, but is creeping down again. Continue full remote downloads every 3 months, but check Optivol monthly. 5. AFib: None recorded recently. She is at high embolic risk and is on appropriate anticoagulation, adjusted for age and small body size. CHADSVasc 5 (age 53, gender, HF, HTN). On long-term amiodarone. If her appetite does not improve, consider discontinuing the amiodarone, although her recent deterioration seems to be related to her fall and injury. Liver and thyroid function tests recently normal..    Medication Adjustments/Labs and Tests Ordered: Current medicines are reviewed at length with the patient today.  Concerns regarding medicines are outlined above.  Medication changes, Labs and Tests ordered today are listed in  the Patient Instructions below. Patient Instructions  Dr Royann Shivers has recommended making the following medication changes: 1. INCREASE Furosemide to 60 mg (1.5 tablets) daily until your comes down to 140 pounds  Your physician recommends that you weigh, daily, at the same time every day, and in the same amount of clothing. Please record your daily weights on the handout provided and bring it to your next appointment.   Dr Royann Shivers recommends that you  schedule a follow-up appointment in 3 months.  If you need a refill on your cardiac medications before your next appointment, please call your pharmacy.     Signed, Thurmon Fair, MD  06/08/2015 5:58 PM    [facility] [address] [place], [place], [place]  [address] Phone: [phone]; Fax: [phone]

## 2015-06-13 ENCOUNTER — Encounter: Payer: Self-pay | Admitting: Cardiovascular Disease

## 2015-06-18 ENCOUNTER — Other Ambulatory Visit: Payer: Self-pay | Admitting: Cardiovascular Disease

## 2015-06-19 ENCOUNTER — Ambulatory Visit (INDEPENDENT_AMBULATORY_CARE_PROVIDER_SITE_OTHER): Payer: PPO

## 2015-06-19 ENCOUNTER — Telehealth: Payer: Self-pay | Admitting: Cardiology

## 2015-06-19 DIAGNOSIS — I5043 Acute on chronic combined systolic (congestive) and diastolic (congestive) heart failure: Secondary | ICD-10-CM

## 2015-06-19 DIAGNOSIS — Z950 Presence of cardiac pacemaker: Secondary | ICD-10-CM

## 2015-06-19 DIAGNOSIS — I5042 Chronic combined systolic (congestive) and diastolic (congestive) heart failure: Secondary | ICD-10-CM

## 2015-06-19 NOTE — Telephone Encounter (Signed)
Confirmed remote transmission w/ pt daughter.   

## 2015-06-20 NOTE — Progress Notes (Signed)
EPIC Encounter for ICM Monitoring  Patient Name: Kathleen [name] is a 82 y.o. female Date: 06/20/2015 Primary Care Physican: [name]., MD Primary Cardiologist: Purvis Sheffield Electrophysiologist: Croitoru Dry Weight: 139 lbs   Bi-V Pacing 100%      In the past month, have you:  1. Gained more than 2 pounds in a day or more than 5 pounds in a week? no  2. Had changes in your medications (with verification of current medications)? no  3. Had more shortness of breath than is usual for you? no  4. Limited your activity because of shortness of breath? no  5. Not been able to sleep because of shortness of breath? no  6. Had increased swelling in your feet, ankles, legs or stomach area? no  7. Had symptoms of dehydration (dizziness, dry mouth, increased thirst, decreased urine output) no  8. Had changes in sodium restriction? no  9. Been compliant with medication? Yes  ICM trend: 3 month view for 06/19/2015   ICM trend: 1 year view for 06/19/2015   Follow-up plan: ICM clinic phone appointment 07/25/2015.   Spoke with daughter, [name] [facility]).   FLUID LEVELS:  Since 06/07/2015 ICM transmission, Optivol thoracic impedance trending along baseline suggesting stable fluid levels.    SYMPTOMS:   None.  Denied any symptoms such as weight gain of 3 pounds overnight or 5 pounds within a week, SOB and/or lower extremity swelling.  Encouraged to call for any fluid symptoms.   Patient visited Dr Royann Shivers on 06/08/2015 and reviewed his instructions with daughter regarding Furosemide adjustment.  She thought patient was to continue with Furosemide 60 mg daily.  Advised Dr Croitoru's instructions have patient staying on 60 mg until weight drops to 140 lbs and then reduce dose to 40 mg daily.  She can adjust the Furosemide to 60 mg a day for weight 145 lbs or greater.  She stated patient's weight today is 139 lbs and will decrease dosage to 40 mg daily.  She reported patient is doing well at this time.     EDUCATION: Limit sodium intake to < 2000 mg and fluid intake to 64 oz daily.     RECOMMENDATIONS: No changes today.    Advised will send updated ICM transmission, showing recheck of fluid levels since 06/07/2015, to Dr Purvis Sheffield for recheck of fluid levels since 06/07/2015.      Karie Soda, RN, CCM 06/20/2015 10:20 AM

## 2015-06-25 ENCOUNTER — Ambulatory Visit (INDEPENDENT_AMBULATORY_CARE_PROVIDER_SITE_OTHER): Payer: PPO

## 2015-06-25 ENCOUNTER — Ambulatory Visit (INDEPENDENT_AMBULATORY_CARE_PROVIDER_SITE_OTHER): Payer: PPO | Admitting: Orthopedic Surgery

## 2015-06-25 ENCOUNTER — Encounter: Payer: Self-pay | Admitting: Orthopedic Surgery

## 2015-06-25 VITALS — BP 137/75 | HR 87 | Ht 68.0 in | Wt 145.0 lb

## 2015-06-25 DIAGNOSIS — S86111A Strain of other muscle(s) and tendon(s) of posterior muscle group at lower leg level, right leg, initial encounter: Secondary | ICD-10-CM

## 2015-06-25 DIAGNOSIS — M25561 Pain in right knee: Secondary | ICD-10-CM

## 2015-06-25 DIAGNOSIS — S86811A Strain of other muscle(s) and tendon(s) at lower leg level, right leg, initial encounter: Secondary | ICD-10-CM | POA: Diagnosis not present

## 2015-06-25 NOTE — Progress Notes (Signed)
Patient ID: ALYZAE [name], female   DOB: 30-Jan-1924, 82 y.o.   MRN: [id number]  Post op annual TKA   Chief Complaint  Patient presents with  . Knee Pain    RIGHT KNEE PAIN, TKA 06/12/09    HPI Kathleen [name] is a 82 y.o. female. Patient got up and felt something pull in the back of her knee felt pain swelling and tightness in the right total knee replacement area with pain behind the knee. No pain in the joint  Date of injury was approximately 2 weeks ago  Past Medical History  Diagnosis Date  . Essential hypertension   . Type 2 diabetes mellitus (HCC)   . Hyperlipidemia   . PAF (paroxysmal atrial fibrillation) (HCC)   . History of pneumonia   . CAD (coronary artery disease)   . Acid reflux   . AV block, 2nd degree     S/p Medtronic pacemaker 03/2011  . Nonischemic cardiomyopathy (HCC)   . Chronic kidney disease (CKD), stage III (moderate)     Past Surgical History  Procedure Laterality Date  . Replacement total knee    . Bladder tack  1970's  . Pacemaker insertion  10/16/2011    Medtronic  . Cholecystectomy  1999  . Nm myocar perf wall motion  03/15/2009    Normal  . Cardiac catheterization  10/15/2011    Normal coronaries  . Cardioversion N/A 10/28/2013    Procedure: CARDIOVERSION;  Surgeon: Thurmon Fair, MD;  Location: [facility] ENDOSCOPY;  Service: Cardiovascular;  Laterality: N/A;  . Permanent pacemaker insertion N/A 03/24/2011    Procedure: PERMANENT PACEMAKER INSERTION;  Surgeon: Thurmon Fair, MD;  Location: [facility] CATH LAB;  Service: Cardiovascular;  Laterality: N/A;  . Left and right heart catheterization with coronary angiogram N/A 10/15/2011    Procedure: LEFT AND RIGHT HEART CATHETERIZATION WITH CORONARY ANGIOGRAM;  Surgeon: Chrystie Nose, MD;  Location: [facility] CATH LAB;  Service: Cardiovascular;  Laterality: N/A;     Allergies  Allergen Reactions  . Coumadin [Warfarin Sodium] Other (See Comments)    Caused Patient to Bleed Out.   . Meloxicam Other (See  Comments)    Unknown  . Metformin And Related Other (See Comments)    Cannot have due to kidney function  . Morphine Itching    Not in right state of mind.   . Nabumetone Nausea And Vomiting  . Oxycodone Nausea And Vomiting  . Sulfonamide Derivatives Hives    Current Outpatient Prescriptions  Medication Sig Dispense Refill  . amiodarone (PACERONE) 200 MG tablet TAKE 1 TABLET BY MOUTH ONCE DAILY. 30 tablet 10  . Coenzyme Q10 (CO Q 10 PO) Take 1 tablet by mouth daily.    Marland Kitchen ELIQUIS 2.5 MG TABS tablet TAKE ONE TABLET BY MOUTH TWICE DAILY. 60 tablet 0  . fluocinonide-emollient (LIDEX-E) 0.05 % cream Apply 1 application topically daily as needed. For skin irritation    . furosemide (LASIX) 40 MG tablet Take 1 tablet (40 mg total) by mouth daily. 30 tablet 2  . glimepiride (AMARYL) 1 MG tablet Take 0.5 tablets (0.5 mg total) by mouth daily with breakfast. (Patient taking differently: Take 0.5 mg by mouth daily as needed (for blood sugar levels over 120). ) 30 tablet 1  . HYDROcodone-acetaminophen (NORCO/VICODIN) 5-325 MG tablet Take 0.5-1 tablets by mouth every 4 (four) hours as needed for severe pain. 15 tablet 0  . lubiprostone (AMITIZA) 8 MCG capsule Take 1 capsule (8 mcg total) by mouth 2 (two) times daily.  60 capsule 5  . meclizine (ANTIVERT) 25 MG tablet Take 25 mg by mouth 2 (two) times daily as needed for dizziness or nausea.     . metoprolol succinate (TOPROL-XL) 25 MG 24 hr tablet Take 3 tablets (75 mg total) by mouth daily. 90 tablet 2  . Multiple Vitamins-Minerals (CENTRUM SILVER PO) Take 1 tablet by mouth every morning.     . Multiple Vitamins-Minerals (ICAPS AREDS 2) CAPS Take 1 capsule by mouth 2 (two) times daily.    . nitroGLYCERIN (NITROSTAT) 0.4 MG SL tablet DISSOLVE 1 TABLET UNDER TONGUE EVERY 5 MINUTES UP TO 15 MIN FOR CHESTPAIN. IF NO RELIEF CALL 911. 25 tablet 4  . omeprazole (PRILOSEC) 20 MG capsule TAKE ONE CAPSULE BY MOUTH DAILY. 30 capsule 6  . ondansetron (ZOFRAN) 4 MG  tablet Take 4 mg by mouth every 4 (four) hours as needed for nausea or vomiting.    . potassium chloride (KLOR-CON) 8 MEQ tablet Take 1 tablet (8 mEq total) by mouth 3 (three) times daily. 270 tablet 3  . pravastatin (PRAVACHOL) 40 MG tablet TAKE 1 TABLET BY MOUTH EACH EVENING. 30 tablet 10  . [DISCONTINUED] benazepril (LOTENSIN) 40 MG tablet Take 40 mg by mouth 2 (two) times daily.     No current facility-administered medications for this visit.    Review of Systems Review of Systems  Musculoskeletal: Positive for joint swelling.  Neurological: Negative for numbness.     Physical Exam Blood pressure 137/75, pulse 87, height 5\' 8"  (1.727 m), weight 145 lb (65.772 kg).   Gen. appearance is normal there are no congenital abnormalities   The patient is oriented 3   Mood and affect are normal   Ambulation is without assistive device   Knee inspection reveals a well-healed incision with no swelling , Tenderness in the medial gastroc no ecchymosis  Knee flexion 120  Stability in the anteroposterior plane is normal as well as in the medial lateral plane  Motor exam reveals full extension without extensor lag   Data Reviewed KNEE XRAYS : Plain films of the knee replacement show no loosening and normal alignment   Assessment Encounter Diagnoses  Name Primary?  . Right knee pain   . Gastrocnemius tear, right, initial encounter Yes     Plan    Follow-up as needed       Fuller Canada 06/25/2015, 9:55 AM       Radiology report  3 views right  knee  AP lateral and patellar sunrise views of the knee  FINDINGS: A total knee prosthesis is noted. It is in anatomic alignment. There is no evidence of loosening.  Impression : normal TKA  xray

## 2015-06-26 NOTE — Addendum Note (Signed)
Addended by: Vickki Hearing on: 06/26/2015 08:51 AM   Modules accepted: Kipp Brood

## 2015-07-09 ENCOUNTER — Other Ambulatory Visit: Payer: Self-pay | Admitting: Cardiovascular Disease

## 2015-07-12 ENCOUNTER — Telehealth: Payer: Self-pay

## 2015-07-12 ENCOUNTER — Telehealth: Payer: Self-pay | Admitting: Cardiology

## 2015-07-12 ENCOUNTER — Ambulatory Visit (INDEPENDENT_AMBULATORY_CARE_PROVIDER_SITE_OTHER): Payer: PPO

## 2015-07-12 DIAGNOSIS — I5042 Chronic combined systolic (congestive) and diastolic (congestive) heart failure: Secondary | ICD-10-CM

## 2015-07-12 DIAGNOSIS — Z950 Presence of cardiac pacemaker: Secondary | ICD-10-CM

## 2015-07-12 MED ORDER — METOLAZONE 2.5 MG PO TABS: 2.5000 mg | ORAL_TABLET | ORAL | Status: DC

## 2015-07-12 NOTE — Telephone Encounter (Signed)
Returned call to daughter as requested.  She stated patient is having weight gain which is related to fluid.   She asked to send ICM transmission and encouraged her to send for review.  She stated she has been nauseated and vomiting.  She has seen a GI physician and no blockage was noted.  I stated I would call her back after I review transmission.

## 2015-07-12 NOTE — Telephone Encounter (Signed)
I spoke with the pt's daughter- will try Zaroxolyn 2.5 mg x 2 doses- then call back Monday, BMP Monday as well. She may need prn Zaroxolyn for wgt > 145.  Corine Shelter PA-C 07/12/2015 11:56 AM

## 2015-07-12 NOTE — Progress Notes (Signed)
EPIC Encounter for ICM Monitoring  Patient Name: Kathleen [name] is a 82 y.o. female Date: 07/12/2015 Primary Care Physican: [name]., MD Primary Cardiologist: Croitoru Electrophysiologist: Croitoru Dry Weight: 147 lbs   Bi-V Pacing 100%      In the past month, have you:  1. Gained more than 2 pounds in a day or more than 5 pounds in a week? Yes, per home scales has gained 10 lbs in last 6 days.  Weighed 137 lbs 6 days ago  2. Had changes in your medications (with verification of current medications)? No  3. Had more shortness of breath than is usual for you? Yes, breathing is labored and feeling of chest fullness  4. Limited your activity because of shortness of breath? Sedentary  5. Not been able to sleep because of shortness of breath? No   6. Had increased swelling in your feet, ankles, legs or stomach area? No   7. Had symptoms of dehydration (dizziness, dry mouth, increased thirst, decreased urine output) No   8. Had changes in sodium restriction? No   9. Been compliant with medication? Yes   ICM trend: 3 month view for 07/11/2015   ICM trend: 1 year view for 07/11/2015   Follow-up plan: ICM clinic phone appointment 07/25/2015.  Received call from daughter, [name] stating she was concerned about patients fluid symptoms.  FLUID LEVELS: Optivol thoracic impedance shows some days of below reference line suggesting fluid accumulation but at baseline 07/10/2015 which is not correlating with symptoms today.    SYMPTOMS: 10 lb weight gain, labored breathing and chest fullness   RECOMMENDATIONS:  Reviewed symptoms with flex, Lossie Faes, PA.  He called daughter to discuss treatment options offering to be seen in the office, go to ER or taking Zaroxolyn.  She opted to have patient take Zaroxolyn.  Call to daughter and instructed to have patient take Zaroxolyn 2.5 mg ordered and instructed to take 1 tablet this afternoon 30 minute prior to taking extra 40 mg Lasix today.   Tomorrow take prescribed Furosemide dosage of 40 mg.  If weight returns to 140 lbs continue with normal prescribed Furosemide dosage of 40 mg daily.  If weight does decrease to 140 lbs, then take Zaroxolyn 2.5 mg 30 minutes prior to prescribed dosage of 40 mg of Furosemide on Saturday, 07/14/2015 then no further dosage of Zaroxolyn without direction.  Advised to continue prescribed Potassium dosage.    BMET ordered for 07/16/2015.  She stated patient normally goes not [facility] lab across from [facility] for lab work.  Reviewed hypokalemia symptoms.  Instructed her to use local emergency number if patient's symptoms worsen.  She verbalized understand of orders and use emergency number if needed.    Will send copy to Dr Royann Shivers for updated transmission and treatment plan.   Karie Soda, RN, CCM 07/12/2015 12:26 PM

## 2015-07-18 ENCOUNTER — Telehealth: Payer: Self-pay

## 2015-07-18 ENCOUNTER — Other Ambulatory Visit: Payer: Self-pay | Admitting: *Deleted

## 2015-07-18 ENCOUNTER — Telehealth: Payer: Self-pay | Admitting: Cardiovascular Disease

## 2015-07-18 ENCOUNTER — Other Ambulatory Visit: Payer: Self-pay | Admitting: Cardiology

## 2015-07-18 DIAGNOSIS — I5042 Chronic combined systolic (congestive) and diastolic (congestive) heart failure: Secondary | ICD-10-CM

## 2015-07-18 LAB — BASIC METABOLIC PANEL
BUN: 22 mg/dL (ref 7–25)
CO2: 27 mmol/L (ref 20–31)
Calcium: 8.4 mg/dL — ABNORMAL LOW (ref 8.6–10.4)
Chloride: 95 mmol/L — ABNORMAL LOW (ref 98–110)
Creat: 1.36 mg/dL — ABNORMAL HIGH (ref 0.60–0.88)
Glucose, Bld: 216 mg/dL — ABNORMAL HIGH (ref 65–99)
Potassium: 3.9 mmol/L (ref 3.5–5.3)
Sodium: 132 mmol/L — ABNORMAL LOW (ref 135–146)

## 2015-07-18 NOTE — Telephone Encounter (Signed)
New message      Need lab orders on a pt that is there

## 2015-07-18 NOTE — Progress Notes (Signed)
Thurmon Fair, MD   Sent: Tue July 17, 2015 3:36 PM    To: Karie Soda, RN    Cc: Marzella Schlein Truitt, CMA        Message     Thanks, will follow up.    [facility]

## 2015-07-18 NOTE — Telephone Encounter (Signed)
Return call to [facility] in [place] and spoke with personel.  Advised re-faxed lab orders to [phone] and she confirmed she received the orders.  Patient is at lab getting BMET drawn.

## 2015-07-18 NOTE — Telephone Encounter (Signed)
Thank you :)

## 2015-07-18 NOTE — Telephone Encounter (Signed)
Spoke with daughter for ICM follow up regarding symptoms of weight gain and SOB on 07/13/2015.  She stated she increased patient's Furosemide 40 mg to bid x 2 days (07/13/2015 and 07/14/2015) instead of giving her the Metolazone that was ordered by Corine Shelter, PA on 07/13/2015.  She stated weight returned to baseline and weight yesterday was 138 lbs.  SOB has resolved.  She reported patient has had weakness since cardioversion but has increased in the last few days.  She did not get BMET drawn on 07/16/2015 as ordered.  Advised for her to have BMET drawn today and order was faxed 07/13/2015 to [facility] lab across from [facility] as she requested.  She stated she will take patient today or at the latest tomorrow to have it drawn.  No further changes today.     Advised will send copy to Dr Royann Shivers providing him with update that patient's symptoms have resolved and labs will be drawn either today or tomorrow.

## 2015-07-18 NOTE — Telephone Encounter (Signed)
Lab order released, returned call to [facility] and left VM to confirm.

## 2015-07-23 ENCOUNTER — Ambulatory Visit: Payer: PPO | Admitting: Physician Assistant

## 2015-07-23 DIAGNOSIS — R0989 Other specified symptoms and signs involving the circulatory and respiratory systems: Secondary | ICD-10-CM

## 2015-07-25 ENCOUNTER — Ambulatory Visit (INDEPENDENT_AMBULATORY_CARE_PROVIDER_SITE_OTHER): Payer: PPO

## 2015-07-25 ENCOUNTER — Telehealth: Payer: Self-pay | Admitting: Cardiology

## 2015-07-25 ENCOUNTER — Telehealth: Payer: Self-pay

## 2015-07-25 DIAGNOSIS — Z950 Presence of cardiac pacemaker: Secondary | ICD-10-CM | POA: Diagnosis not present

## 2015-07-25 DIAGNOSIS — I5042 Chronic combined systolic (congestive) and diastolic (congestive) heart failure: Secondary | ICD-10-CM

## 2015-07-25 NOTE — Telephone Encounter (Signed)
Agree 

## 2015-07-25 NOTE — Telephone Encounter (Signed)
Confirmed remote transmission w/ pt daughter.   

## 2015-07-25 NOTE — Telephone Encounter (Signed)
Received a call from patient's daughter [name].She stated mother has been having frequent jittery spells.Stated she takes a NTG with relief.Stated she is concerned spells are more frequent.Stated when she has these spells she gets very nervous and anxious.Stated she wanted to make sure ok for her to take NTG.Advised she needs to be seen.Appointment scheduled with Tereso Newcomer PA 07/30/15 at 11:00 am at [facility] office.

## 2015-07-26 NOTE — Progress Notes (Signed)
EPIC Encounter for ICM Monitoring  Patient Name: Kathleen [name] is a 82 y.o. female Date: 07/26/2015 Primary Care Physican: [name]., MD Primary Cardiologist: Croitoru Electrophysiologist: Croitoru Dry Weight: 136.2 lb  Bi-V Pacing:  100%       Spoke with daughter [name] ([place]).  Heart Failure questions reviewed, pt asymptomatic for fluid symptoms.  She has been having tremors which patient has an appointment with Norma Fredrickson, NP next week.   Thoracic impedence stable.  Recommendations: No changes.     ICM trend: 07/25/2015    Follow-up plan: ICM clinic phone appointment on 08/13/2015.  Copy of ICM check sent to device physician.   Karie Soda, RN 07/26/2015 12:54 PM

## 2015-07-30 ENCOUNTER — Ambulatory Visit (INDEPENDENT_AMBULATORY_CARE_PROVIDER_SITE_OTHER): Payer: PPO | Admitting: Nurse Practitioner

## 2015-07-30 ENCOUNTER — Encounter: Payer: Self-pay | Admitting: Nurse Practitioner

## 2015-07-30 VITALS — BP 140/64 | HR 71 | Ht 67.0 in | Wt 138.2 lb

## 2015-07-30 DIAGNOSIS — R0789 Other chest pain: Secondary | ICD-10-CM

## 2015-07-30 DIAGNOSIS — Z950 Presence of cardiac pacemaker: Secondary | ICD-10-CM

## 2015-07-30 DIAGNOSIS — I5042 Chronic combined systolic (congestive) and diastolic (congestive) heart failure: Secondary | ICD-10-CM | POA: Diagnosis not present

## 2015-07-30 LAB — TSH: TSH: 3.38 mIU/L

## 2015-07-30 LAB — CBC
HCT: 36.6 % (ref 35.0–45.0)
Hemoglobin: 12.5 g/dL (ref 11.7–15.5)
MCH: 32.2 pg (ref 27.0–33.0)
MCHC: 34.2 g/dL (ref 32.0–36.0)
MCV: 94.3 fL (ref 80.0–100.0)
MPV: 9.7 fL (ref 7.5–12.5)
Platelets: 327 10*3/uL (ref 140–400)
RBC: 3.88 MIL/uL (ref 3.80–5.10)
RDW: 14.5 % (ref 11.0–15.0)
WBC: 10.1 10*3/uL (ref 3.8–10.8)

## 2015-07-30 LAB — BASIC METABOLIC PANEL
BUN: 25 mg/dL (ref 7–25)
CO2: 27 mmol/L (ref 20–31)
Calcium: 8.9 mg/dL (ref 8.6–10.4)
Chloride: 95 mmol/L — ABNORMAL LOW (ref 98–110)
Creat: 1.83 mg/dL — ABNORMAL HIGH (ref 0.60–0.88)
Glucose, Bld: 197 mg/dL — ABNORMAL HIGH (ref 65–99)
Potassium: 4.4 mmol/L (ref 3.5–5.3)
Sodium: 135 mmol/L (ref 135–146)

## 2015-07-30 LAB — HEPATIC FUNCTION PANEL
ALT: 43 U/L — ABNORMAL HIGH (ref 6–29)
AST: 43 U/L — ABNORMAL HIGH (ref 10–35)
Albumin: 3.7 g/dL (ref 3.6–5.1)
Alkaline Phosphatase: 136 U/L — ABNORMAL HIGH (ref 33–130)
Bilirubin, Direct: 0.2 mg/dL (ref <=0.2)
Indirect Bilirubin: 0.4 mg/dL (ref 0.2–1.2)
Total Bilirubin: 0.6 mg/dL (ref 0.2–1.2)
Total Protein: 6.6 g/dL (ref 6.1–8.1)

## 2015-07-30 MED ORDER — AMIODARONE HCL 200 MG PO TABS: 100.0000 mg | ORAL_TABLET | Freq: Every day | ORAL | Status: DC

## 2015-07-30 NOTE — Patient Instructions (Addendum)
We will be checking the following labs today - BMET, CBC, TSH and HPF   Medication Instructions:    Continue with your current medicines. BUT  I am cutting the amiodarone back to just half a tablet    Testing/Procedures To Be Arranged:  N/A  Follow-Up:   See Dr. Royann Shivers next month as planned.  Video a spell if you can    Other Special Instructions:   Use your walker    If you need a refill on your cardiac medications before your next appointment, please call your pharmacy.   Call the [facility] office at [phone] if you have any questions, problems or concerns.

## 2015-07-30 NOTE — Progress Notes (Signed)
CARDIOLOGY OFFICE NOTE  Date:  07/30/2015    Kathleen [name] Date of Birth: 1924-11-25 Medical Record #[id number]  PCP:  [name]., MD  Cardiologist:  Croitoru   Chief Complaint  Patient presents with  . Chest Pain    Work in visit - seen for Dr. Royann Shivers    History of Present Illness: Kathleen [name] is a 82 y.o. female who presents today for a work in visit. Seen for Dr. Royann Shivers.   She has a history of long-standing nonischemic cardiomyopathy, high-grade AV block requiring permanent pacemaker therapy (upgrade to biventricular pacemaker after she developed congestive heart failure, excellent response to CRT), paroxysmal atrial fibrillation on chronic amiodarone and Eliquis therapy, & HLD. In January 2017 she fell and suffered a pelvic fracture and a right wrist fracture.   Last seen back in May - appetite was poor. Had lost weight. Mild CHF exacerbation back in April. Was hospitalized for 4 days.   At last interrogation of her pacemaker shows 100% biventricular pacing. She also has nearly 100% atrial pacing. Her heart rate histogram was quite flat, reflecting her sedentary status. She had not had any recent atrial fibrillation or ventricular high rates. Her thoracic impedance (optivol) had returned to baseline, but was starting to suggest gradual fluid accumulation again at last visit.  Phone call last week -  Received a call from patient's daughter [name].She stated mother has been having frequent jittery spells.Stated she takes a NTG with relief.Stated she is concerned spells are more frequent.Stated when she has these spells she gets very nervous and anxious.Stated she wanted to make sure ok for her to take NTG.Advised she needs to be seen.Appointment scheduled with Tereso Newcomer PA 07/30/15 at 11:00 am at [facility] office.         Thus added to my schedule for today.  Comes in today. Here with her daughter. She is here because of "shaking spells". These  spells get so bad that she cannot hold a fork/spoon and feed herself. Makes her family give her a NTG - not really clear if this helps. No spells recorded. Very unsteady gait. Not using a walker or cane despite much advice from family. No real chest pain. Blood sugars are ok. Recent A1C "was on track".   Recent pacer interrogation without spells of AF. Opti vol currently stable.   Past Medical History  Diagnosis Date  . Essential hypertension   . Type 2 diabetes mellitus (HCC)   . Hyperlipidemia   . PAF (paroxysmal atrial fibrillation) (HCC)   . History of pneumonia   . CAD (coronary artery disease)   . Acid reflux   . AV block, 2nd degree     S/p Medtronic pacemaker 03/2011  . Nonischemic cardiomyopathy (HCC)   . Chronic kidney disease (CKD), stage III (moderate)     Past Surgical History  Procedure Laterality Date  . Replacement total knee    . Bladder tack  1970's  . Pacemaker insertion  10/16/2011    Medtronic  . Cholecystectomy  1999  . Nm myocar perf wall motion  03/15/2009    Normal  . Cardiac catheterization  10/15/2011    Normal coronaries  . Cardioversion N/A 10/28/2013    Procedure: CARDIOVERSION;  Surgeon: Thurmon Fair, MD;  Location: [facility] ENDOSCOPY;  Service: Cardiovascular;  Laterality: N/A;  . Permanent pacemaker insertion N/A 03/24/2011    Procedure: PERMANENT PACEMAKER INSERTION;  Surgeon: Thurmon Fair, MD;  Location: [facility] CATH LAB;  Service: Cardiovascular;  Laterality: N/A;  . Left and right heart catheterization with coronary angiogram N/A 10/15/2011    Procedure: LEFT AND RIGHT HEART CATHETERIZATION WITH CORONARY ANGIOGRAM;  Surgeon: Chrystie Nose, MD;  Location: [facility] CATH LAB;  Service: Cardiovascular;  Laterality: N/A;     Medications: Current Outpatient Prescriptions  Medication Sig Dispense Refill  . amiodarone (PACERONE) 200 MG tablet Take 0.5 tablets (100 mg total) by mouth daily. 30 tablet 10  . Coenzyme Q10 (CO Q 10 PO) Take 1 tablet by mouth daily.     Marland Kitchen ELIQUIS 2.5 MG TABS tablet TAKE ONE TABLET BY MOUTH TWICE DAILY. 60 tablet 5  . fluocinonide-emollient (LIDEX-E) 0.05 % cream Apply 1 application topically daily as needed. For skin irritation    . furosemide (LASIX) 40 MG tablet Take 40 mg by mouth daily. 3 lbs weight gain in 24 hours extra half tablet ( 20 mg )    . glimepiride (AMARYL) 1 MG tablet Take 1 mg by mouth as needed (for blood sugar levels over 120).    Marland Kitchen HYDROcodone-acetaminophen (NORCO/VICODIN) 5-325 MG tablet Take 0.5-1 tablets by mouth every 4 (four) hours as needed for severe pain. 15 tablet 0  . lubiprostone (AMITIZA) 8 MCG capsule Take 1 capsule (8 mcg total) by mouth 2 (two) times daily. 60 capsule 5  . metolazone (ZAROXOLYN) 2.5 MG tablet Take 2.5 mg by mouth daily. If patients weighs over 140 take 1 tablet (2.5 mg ) with extra half of lasix (20 mg )    . metoprolol succinate (TOPROL-XL) 25 MG 24 hr tablet Take 3 tablets (75 mg total) by mouth daily. 90 tablet 2  . Multiple Vitamins-Minerals (CENTRUM SILVER PO) Take 1 tablet by mouth every morning.     . Multiple Vitamins-Minerals (ICAPS AREDS 2) CAPS Take 1 capsule by mouth 2 (two) times daily.    . nitroGLYCERIN (NITROSTAT) 0.4 MG SL tablet DISSOLVE 1 TABLET UNDER TONGUE EVERY 5 MINUTES UP TO 15 MIN FOR CHESTPAIN. IF NO RELIEF CALL 911. 25 tablet 4  . omeprazole (PRILOSEC) 20 MG capsule TAKE ONE CAPSULE BY MOUTH DAILY. 30 capsule 6  . ondansetron (ZOFRAN) 4 MG tablet Take 4 mg by mouth every 4 (four) hours as needed for nausea or vomiting.    . potassium chloride (KLOR-CON) 8 MEQ tablet Take 1 tablet (8 mEq total) by mouth 3 (three) times daily. 270 tablet 3  . pravastatin (PRAVACHOL) 40 MG tablet TAKE 1 TABLET BY MOUTH EACH EVENING. 30 tablet 10  . [DISCONTINUED] benazepril (LOTENSIN) 40 MG tablet Take 40 mg by mouth 2 (two) times daily.     No current facility-administered medications for this visit.    Allergies: Allergies  Allergen Reactions  . Coumadin  [Warfarin Sodium] Other (See Comments)    Caused Patient to Bleed Out.   . Meloxicam Other (See Comments)    Unknown  . Metformin And Related Other (See Comments)    Cannot have due to kidney function  . Morphine Itching    Not in right state of mind.   . Nabumetone Nausea And Vomiting  . Oxycodone Nausea And Vomiting  . Sulfonamide Derivatives Hives    Social History: The patient  reports that she has never smoked. She has never used smokeless tobacco. She reports that she does not drink alcohol or use illicit drugs.   Family History: The patient's family history includes Heart attack in her brother; Heart murmur in her daughter; Suicidality in her mother.   Review of Systems: Please  see the history of present illness.   Otherwise, the review of systems is positive for none.   All other systems are reviewed and negative.   Physical Exam: VS:  BP 140/64 mmHg  Pulse 71  Ht 5\' 7"  (1.702 m)  Wt 138 lb 3.2 oz (62.687 kg)  BMI 21.64 kg/m2 .  BMI Body mass index is 21.64 kg/(m^2).  Wt Readings from Last 3 Encounters:  07/30/15 138 lb 3.2 oz (62.687 kg)  06/25/15 145 lb (65.772 kg)  06/08/15 145 lb 9.6 oz (66.044 kg)    General: Pleasant. Elderly female who looks chronically ill but alert and in no acute distress. Weight is down. She has a fine tremor of her hands noted. HEENT: Normal. Neck: Supple, no JVD, carotid bruits, or masses noted.  Cardiac: Regular rate and rhythm. No murmurs, rubs, or gallops. No real edema.  Respiratory:  Lungs are clear to auscultation bilaterally with normal work of breathing.  GI: Soft and nontender.  MS: No deformity or atrophy. Gait and ROM intact. But she is unsteady.  Skin: Warm and dry. Color is normal.  Neuro:  Strength and sensation are intact and no gross focal deficits noted.  Psych: Alert, appropriate and with normal affect.   LABORATORY DATA:  EKG:  EKG is ordered today. This demonstrates AV paced rhythm.  Lab Results  Component  Value Date   WBC 8.5 04/30/2015   HGB 12.3 04/30/2015   HCT 36.7 04/30/2015   PLT 263 04/30/2015   GLUCOSE 216* 07/18/2015   CHOL 151 04/11/2014   TRIG 108 04/11/2014   HDL 60 04/11/2014   LDLCALC 69 04/11/2014   ALT 39* 01/03/2015   AST 38* 01/03/2015   NA 132* 07/18/2015   K 3.9 07/18/2015   CL 95* 07/18/2015   CREATININE 1.36* 07/18/2015   BUN 22 07/18/2015   CO2 27 07/18/2015   TSH 3.30 05/29/2015   INR 1.59* 10/24/2013   HGBA1C 8.2* 10/04/2013    BNP (last 3 results)  Recent Labs  10/27/14 1100 04/30/15 2050  BNP 530.0* 589.0*    ProBNP (last 3 results) No results for input(s): PROBNP in the last 8760 hours.   Other Studies Reviewed Today:  Echo Study Conclusions from 04/2015  - Left ventricle: The cavity size was normal. Wall thickness was  increased in a pattern of mild LVH. Systolic function was mildly  to moderately reduced. The estimated ejection fraction was in the  range of 40% to 45%. There is akinesis of the inferolateral and  inferior myocardium. Doppler parameters are consistent with  restrictive physiology, indicative of decreased left ventricular  diastolic compliance and/or increased left atrial pressure. - Aortic valve: Trileaflet; mildly calcified leaflets. - Mitral valve: Calcified annulus. There was moderate  regurgitation. - Left atrium: The atrium was severely dilated. - Right ventricle: Pacer wire or catheter noted in right ventricle. - Right atrium: The atrium was mildly dilated. - Tricuspid valve: There was mild regurgitation. - Pulmonary arteries: PA peak pressure: 49 mm Hg (S). - Pericardium, extracardiac: There was no pericardial effusion.  Impressions:  - Mild LVH with LVEF approximately 40-45%. There is akinesis of the  inferolateral wall. Restrictive diastolic filling pattern with  increased LV filling pressure. Severe left atrial enlargement.  MAC with moderate mitral regurgitation. Mildly sclerotic aortic   valve. Device wire noted within the right heart. Mild tricuspid  regurgitation with PASP 49 mmHg.   Assessment/Plan: 1. CHF - chronic systolic and diastolic HF:   2. High grade  AV block: Over the years and with added treatment with amiodarone, she has progressed to essentially complete heart block. CRT in place. Recent remote transmission. Need to review. She should be considered pacemaker dependent.  3. AFib:  She is at high embolic risk and is on appropriate anticoagulation, adjusted for age and small body size. CHADSVasc 5 (age 62, gender, HF, HTN). On long-term amiodarone. No recent surveillance labs - will get today.    4. Tremor - may be from amiodarone - will cut her dose back to 100 mg a day. May need to consider neurology referral. Check surveillance labs today. Family to videotape a spell if possible.   Current medicines are reviewed with the patient today.  The patient does not have concerns regarding medicines other than what has been noted above.  The following changes have been made:  See above.  Labs/ tests ordered today include:    Orders Placed This Encounter  Procedures  . Basic metabolic panel  . CBC  . Hepatic function panel  . TSH  . EKG 12-Lead     Disposition:   FU with Dr. Royann Shivers as planned next month.   Patient is agreeable to this plan and will call if any problems develop in the interim.   Signed: Rosalio Macadamia, RN, ANP-C 07/30/2015 12:02 PM  [facility] [address] [place], [place]  [address] Phone: [phone] Fax: [phone]

## 2015-07-30 NOTE — Progress Notes (Signed)
Thanks. The video is a nice suggestion [employer]

## 2015-08-13 ENCOUNTER — Telehealth: Payer: Self-pay | Admitting: Cardiology

## 2015-08-13 ENCOUNTER — Ambulatory Visit (INDEPENDENT_AMBULATORY_CARE_PROVIDER_SITE_OTHER): Payer: PPO

## 2015-08-13 DIAGNOSIS — I5042 Chronic combined systolic (congestive) and diastolic (congestive) heart failure: Secondary | ICD-10-CM

## 2015-08-13 DIAGNOSIS — I5043 Acute on chronic combined systolic (congestive) and diastolic (congestive) heart failure: Secondary | ICD-10-CM

## 2015-08-13 DIAGNOSIS — Z950 Presence of cardiac pacemaker: Secondary | ICD-10-CM

## 2015-08-13 NOTE — Telephone Encounter (Signed)
Confirmed remote transmission w/ pt daughter.   

## 2015-08-14 NOTE — Progress Notes (Signed)
Great.  Thanks

## 2015-08-14 NOTE — Progress Notes (Signed)
EPIC Encounter for ICM Monitoring  Patient Name: Kathleen [name] is a 82 y.o. female Date: 08/14/2015 Primary Care Physican: [name]., MD Primary Cardiologist: Croitoru Electrophysiologist: Croitoru Dry Weight: 137 lb  Bi-V Pacing:  100%       Heart Failure questions reviewed, pt asymptomatic   Thoracic impedance stable.  Recommendations: No changes.  Low sodium diet education provided   ICM trend: 08/13/2015    Follow-up plan: ICM clinic phone appointment on 09/14/2015.  Copy of ICM check sent to device physician.   Karie Soda, RN 08/14/2015 10:52 AM

## 2015-08-20 DIAGNOSIS — X32XXXD Exposure to sunlight, subsequent encounter: Secondary | ICD-10-CM | POA: Diagnosis not present

## 2015-08-20 DIAGNOSIS — B078 Other viral warts: Secondary | ICD-10-CM | POA: Diagnosis not present

## 2015-08-20 DIAGNOSIS — L570 Actinic keratosis: Secondary | ICD-10-CM | POA: Diagnosis not present

## 2015-09-08 ENCOUNTER — Other Ambulatory Visit (INDEPENDENT_AMBULATORY_CARE_PROVIDER_SITE_OTHER): Payer: Self-pay | Admitting: Internal Medicine

## 2015-09-10 ENCOUNTER — Ambulatory Visit (INDEPENDENT_AMBULATORY_CARE_PROVIDER_SITE_OTHER): Payer: PPO | Admitting: Cardiovascular Disease

## 2015-09-10 ENCOUNTER — Encounter: Payer: Self-pay | Admitting: Cardiovascular Disease

## 2015-09-10 VITALS — BP 121/66 | HR 76 | Ht 67.0 in | Wt 137.8 lb

## 2015-09-10 DIAGNOSIS — I441 Atrioventricular block, second degree: Secondary | ICD-10-CM

## 2015-09-10 DIAGNOSIS — Z950 Presence of cardiac pacemaker: Secondary | ICD-10-CM

## 2015-09-10 DIAGNOSIS — I480 Paroxysmal atrial fibrillation: Secondary | ICD-10-CM | POA: Diagnosis not present

## 2015-09-10 DIAGNOSIS — I5042 Chronic combined systolic (congestive) and diastolic (congestive) heart failure: Secondary | ICD-10-CM | POA: Diagnosis not present

## 2015-09-10 NOTE — Patient Instructions (Signed)
Dr Royann Shivers recommends that you continue on your current medications as directed. Please refer to the Current Medication list given to you today.  Remote monitoring is used to monitor your Pacemaker of ICD from home. This monitoring reduces the number of office visits required to check your device to one time per year. It allows [place] to keep an eye on the functioning of your device to ensure it is working properly. You are scheduled for a device check from home on Monday, November 27th, 2017. You may send your transmission at any time that day. If you have a wireless device, the transmission will be sent automatically. After your physician reviews your transmission, you will receive a postcard with your next transmission date.  Dr Royann Shivers recommends that you schedule a follow-up appointment in 6 months with a device check. You will receive a reminder letter in the mail two months in advance. If you don't receive a letter, please call our office to schedule the follow-up appointment.  If you need a refill on your cardiac medications before your next appointment, please call your pharmacy.

## 2015-09-10 NOTE — Progress Notes (Signed)
Patient ID: Kathleen [name], female   DOB: 04-23-1924, 82 y.o.   MRN: [id number] Patient ID: Kathleen [name], female   DOB: 07-Jan-1925, 82 y.o.   MRN: [id number]    Cardiology Office Note    Date:  09/11/2015   ID:  Kathleen [name], DOB 1924/04/06, MRN [id number]  PCP:  [name]., MD  Cardiologist:   Thurmon Fair, MD   Chief Complaint  Patient presents with  . Follow-up    History of Present Illness:  Kathleen [name] is a 82 y.o. female with long-standing nonischemic cardiomyopathy, high-grade AV block requiring permanent pacemaker therapy (upgrade to biventricular pacemaker after she developed congestive heart failure, excellent response to CRT), paroxysmal atrial fibrillation on chronic amiodarone and Eliquis therapy, hyperlipidemia, and in for routine follow-up. In January 2017 she fell and suffered a pelvic fracture and a right wrist fracture. He recently had a face lesion removed by her dermatologist.  Interrogation of her pacemaker shows 100% biventricular pacing. She also has 100% atrial pacing. Her heart rate histogram is quite flat, reflecting her sedentary status. She has not had any recent atrial fibrillation or ventricular high rates. Her thoracic impedance (optivol) has remained at baseline. Generator longevity 3.5 years.  On the current diuretic dose she has maintained good volume status, without dyspnea or edema.   Past Medical History:  Diagnosis Date  . Acid reflux   . 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 CAD (coronary artery disease)   . Chronic kidney disease (CKD), stage III (moderate)   . Essential hypertension   . History of pneumonia   . Hyperlipidemia   . Nonischemic cardiomyopathy (HCC)   . PAF (paroxysmal atrial fibrillation) (HCC)   . Type 2 diabetes mellitus (HCC)     Past Surgical History:  Procedure Laterality Date  . Bladder tack  1970's  . CARDIAC CATHETERIZATION  10/15/2011   Normal coronaries  .  CARDIOVERSION N/A 10/28/2013   Procedure: CARDIOVERSION;  Surgeon: Thurmon Fair, MD;  Location: [facility] END[facility];  Service: Cardiovascular;  Laterality: N/A;  . CHOLECYSTECTOMY  1999  . LEFT AND RIGHT HEART CATHETERIZATION WITH CORONARY ANGIOGRAM N/A 10/15/2011   Procedure: LEFT AND RIGHT HEART CATHETERIZATION WITH CORONARY ANGIOGRAM;  Surgeon: Chrystie Nose, MD;  Location: [facility] CATH LAB;  Service: Cardiovascular;  Laterality: N/A;  . NM MYOCAR PERF WALL MOTION  03/15/2009   Normal  . PACEMAKER INSERTION  10/16/2011   Medtronic  . PERMANENT PACEMAKER INSERTION N/A 03/24/2011   Procedure: PERMANENT PACEMAKER INSERTION;  Surgeon: Thurmon Fair, MD;  Location: [facility] CATH LAB;  Service: Cardiovascular;  Laterality: N/A;  . REPLACEMENT TOTAL KNEE      Current Medications: Outpatient Medications Prior to Visit  Medication Sig Dispense Refill  . amiodarone (PACERONE) 200 MG tablet Take 0.5 tablets (100 mg total) by mouth daily. 30 tablet 10  . AMITIZA 8 MCG capsule TAKE (1) CAPSULE BY MOUTH TWICE DAILY. 60 capsule 5  . Coenzyme Q10 (CO Q 10 PO) Take 1 tablet by mouth daily.    Marland Kitchen ELIQUIS 2.5 MG TABS tablet TAKE ONE TABLET BY MOUTH TWICE DAILY. 60 tablet 5  . fluocinonide-emollient (LIDEX-E) 0.05 % cream Apply 1 application topically daily as needed. For skin irritation    . furosemide (LASIX) 40 MG tablet Take 40 mg by mouth daily. 3 lbs weight gain in 24 hours extra half tablet ( 20 mg )    . glimepiride (AMARYL) 1 MG tablet Take 1 mg  by mouth as needed (for blood sugar levels over 120).    . metolazone (ZAROXOLYN) 2.5 MG tablet Take 2.5 mg by mouth daily. If patients weighs over 140 take 1 tablet (2.5 mg ) with extra half of lasix (20 mg )    . metoprolol succinate (TOPROL-XL) 25 MG 24 hr tablet Take 3 tablets (75 mg total) by mouth daily. 90 tablet 2  . Multiple Vitamins-Minerals (CENTRUM SILVER PO) Take 1 tablet by mouth every morning.     . Multiple Vitamins-Minerals (ICAPS AREDS 2) CAPS Take 1  capsule by mouth 2 (two) times daily.    . nitroGLYCERIN (NITROSTAT) 0.4 MG SL tablet DISSOLVE 1 TABLET UNDER TONGUE EVERY 5 MINUTES UP TO 15 MIN FOR CHESTPAIN. IF NO RELIEF CALL 911. 25 tablet 4  . omeprazole (PRILOSEC) 20 MG capsule TAKE ONE CAPSULE BY MOUTH DAILY. 30 capsule 6  . ondansetron (ZOFRAN) 4 MG tablet Take 4 mg by mouth every 4 (four) hours as needed for nausea or vomiting.    . potassium chloride (KLOR-CON) 8 MEQ tablet Take 1 tablet (8 mEq total) by mouth 3 (three) times daily. 270 tablet 3  . pravastatin (PRAVACHOL) 40 MG tablet TAKE 1 TABLET BY MOUTH EACH EVENING. 30 tablet 10  . HYDROcodone-acetaminophen (NORCO/VICODIN) 5-325 MG tablet Take 0.5-1 tablets by mouth every 4 (four) hours as needed for severe pain. 15 tablet 0   No facility-administered medications prior to visit.      Allergies:   Coumadin [warfarin sodium]; Meloxicam; Metformin and related; Morphine; Nabumetone; Oxycodone; and Sulfonamide derivatives   Social History   Social History  . Marital status: Widowed    Spouse name: N/A  . Number of children: N/A  . Years of education: N/A   Social History Main Topics  . Smoking status: Never Smoker  . Smokeless tobacco: Never Used  . Alcohol use No  . Drug use: No  . Sexual activity: Not Currently   Other Topics Concern  . Not on file   Social History Narrative  . No narrative on file     Family History:  The patient's family history includes Heart attack in her brother; Heart murmur in her daughter; Suicidality in her mother.   ROS:   Please see the history of present illness.    ROS All other systems reviewed and are negative.   PHYSICAL EXAM:   VS:  BP 121/66 (BP Location: Left Arm, Patient Position: Sitting, Cuff Size: Normal)   Pulse 76   Ht 5\' 7"  (1.702 m)   Wt 137 lb 12.8 oz (62.5 kg)   SpO2 97%   BMI 21.58 kg/m    GEN: Well nourished, well developed, in no acute distress, she appears more frail than in the past and is very slender    HEENT: normal  Neck: no JVD, carotid bruits, or masses Cardiac: Her.sickly split second heart sound, RRR; no murmurs, rubs, or gallops,no edema , healthy left subclavian pacemaker site Respiratory:  clear to auscultation bilaterally, normal work of breathing GI: soft, nontender, nondistended, + BS MS: no deformity or atrophy  Skin: warm and dry, no rash Neuro:  Alert and Oriented x 3, Strength and sensation are intact Psych: euthymic mood, full affect  Wt Readings from Last 3 Encounters:  09/10/15 137 lb 12.8 oz (62.5 kg)  07/30/15 138 lb 3.2 oz (62.7 kg)  06/25/15 145 lb (65.8 kg)      Studies/Labs Reviewed:   EKG:  EKG is not ordered today.  The  atrial cardiac electrogram demonstrates AV sequential pacing  Recent Labs: 04/30/2015: B Natriuretic Peptide 589.0 05/04/2015: Magnesium 1.7 07/30/2015: ALT 43; BUN 25; Creat 1.83; Hemoglobin 12.5; Platelets 327; Potassium 4.4; Sodium 135; TSH 3.38   Lipid Panel    Component Value Date/Time   CHOL 151 04/11/2014 0820   TRIG 108 04/11/2014 0820   HDL 60 04/11/2014 0820   CHOLHDL 2.5 04/11/2014 0820   VLDL 22 04/11/2014 0820   LDLCALC 69 04/11/2014 0820      ASSESSMENT:    1. Chronic combined systolic and diastolic CHF (congestive heart failure) (HCC)   2. Symptomatic advanced heart block, 2:1 AV block, RT BBB, Lt post. fas. block.(March 2013)   3. Biventricular cardiac pacemaker in situ   4. Paroxysmal atrial fibrillation (HCC)      PLAN:  In order of problems listed above:  1. CHF: She Appears clinically euvolemic and denies shortness of breath. She is beneath her estimated "dry weight" of 140 pounds. Her appetite remains poor and we may have to reevaluate her dry weight periodically as she loses real weight.. Echo performed during her last hospitalization shows an ejection fraction of 40-45 % and showed evidence of restrictive filling consistent with severely elevated filling pressures. She has a severely dilated left  atrium and moderate mitral insufficiency, likely secondary to the cardiomyopathy and volume overload 2. High grade AV block: Over the years and with added treatment with amiodarone, she has progressed to essentially complete heart block. She should be considered pacemaker dependent. 3. CRT-P: Normal function of her dual-chamber biventricular pacemaker. All lead parameters are in normal range. Thoracic impedance had returned to baseline, is enrolled in the heart failure device clinic: check Optivol monthly. 4. AFib: None recorded recently. She is at high embolic risk and is on appropriate anticoagulation, adjusted for age and small body size. CHADSVasc 5 (age 44, gender, HF, HTN). On long-term amiodarone. If her appetite does not improve, consider discontinuing the amiodarone, although her recent deterioration seems to be related to her fall and injury. Liver and thyroid function tests recently normal..   Medication Adjustments/Labs and Tests Ordered: Current medicines are reviewed at length with the patient today.  Concerns regarding medicines are outlined above.  Medication changes, Labs and Tests ordered today are listed in the Patient Instructions below. Patient Instructions  Dr Royann Shivers recommends that you continue on your current medications as directed. Please refer to the Current Medication list given to you today.  Remote monitoring is used to monitor your Pacemaker of ICD from home. This monitoring reduces the number of office visits required to check your device to one time per year. It allows [place] to keep an eye on the functioning of your device to ensure it is working properly. You are scheduled for a device check from home on Monday, November 27th, 2017. You may send your transmission at any time that day. If you have a wireless device, the transmission will be sent automatically. After your physician reviews your transmission, you will receive a postcard with your next transmission date.  Dr  Royann Shivers recommends that you schedule a follow-up appointment in 6 months with a device check. You will receive a reminder letter in the mail two months in advance. If you don't receive a letter, please call our office to schedule the follow-up appointment.  If you need a refill on your cardiac medications before your next appointment, please call your pharmacy.    Signed, Thurmon Fair, MD  09/11/2015 1:46 PM  [facility] [address], [place], [place]  [address] Phone: [phone]; Fax: [phone]

## 2015-09-11 ENCOUNTER — Telehealth: Payer: Self-pay

## 2015-09-11 ENCOUNTER — Encounter: Payer: Self-pay | Admitting: Cardiovascular Disease

## 2015-09-11 LAB — CUP PACEART INCLINIC DEVICE CHECK
Brady Statistic AP VP Percent: 99.95 %
Brady Statistic AP VS Percent: 0.01 %
Brady Statistic AS VP Percent: 0.04 %
Implantable Lead Implant Date: 20130311
Implantable Lead Implant Date: 20131003
Implantable Lead Location: 753858
Implantable Lead Location: 753859
Implantable Lead Model: 5086
Lead Channel Impedance Value: 361 Ohm
Lead Channel Impedance Value: 361 Ohm
Lead Channel Impedance Value: 437 Ohm
Lead Channel Impedance Value: 437 Ohm
Lead Channel Impedance Value: 475 Ohm
Lead Channel Impedance Value: 494 Ohm
Lead Channel Pacing Threshold Pulse Width: 0.4 ms
Lead Channel Pacing Threshold Pulse Width: 0.4 ms
Lead Channel Sensing Intrinsic Amplitude: 22.625 mV
Lead Channel Sensing Intrinsic Amplitude: 22.625 mV
Lead Channel Setting Pacing Pulse Width: 0.4 ms
Lead Channel Setting Sensing Sensitivity: 4 mV
MDC IDC LEAD IMPLANT DT: 20130311
MDC IDC LEAD LOCATION: 753860
MDC IDC LEAD MODEL: 4194
MDC IDC LEAD MODEL: 5086
MDC IDC MSMT BATTERY REMAINING LONGEVITY: 48 mo
MDC IDC MSMT BATTERY VOLTAGE: 2.99 V
MDC IDC MSMT LEADCHNL LV IMPEDANCE VALUE: 285 Ohm
MDC IDC MSMT LEADCHNL LV IMPEDANCE VALUE: 627 Ohm
MDC IDC MSMT LEADCHNL LV IMPEDANCE VALUE: 627 Ohm
MDC IDC MSMT LEADCHNL LV PACING THRESHOLD AMPLITUDE: 1.125 V
MDC IDC MSMT LEADCHNL RA PACING THRESHOLD AMPLITUDE: 0.875 V
MDC IDC MSMT LEADCHNL RA SENSING INTR AMPL: 1.875 mV
MDC IDC MSMT LEADCHNL RA SENSING INTR AMPL: 1.875 mV
MDC IDC MSMT LEADCHNL RV PACING THRESHOLD AMPLITUDE: 1.25 V
MDC IDC MSMT LEADCHNL RV PACING THRESHOLD PULSEWIDTH: 0.4 ms
MDC IDC SESS DTM: 20170829003738
MDC IDC SET LEADCHNL LV PACING AMPLITUDE: 2.25 V
MDC IDC SET LEADCHNL LV PACING PULSEWIDTH: 0.4 ms
MDC IDC SET LEADCHNL RA PACING AMPLITUDE: 2 V
MDC IDC SET LEADCHNL RV PACING AMPLITUDE: 2.5 V
MDC IDC STAT BRADY AS VS PERCENT: 0 %
MDC IDC STAT BRADY RA PERCENT PACED: 99.96 %
MDC IDC STAT BRADY RV PERCENT PACED: 99.99 %

## 2015-09-11 MED ORDER — HYDROCODONE-ACETAMINOPHEN 5-325 MG PO TABS: 0.5000 | ORAL_TABLET | ORAL | 0 refills | Status: DC | PRN

## 2015-09-11 NOTE — Telephone Encounter (Signed)
-----   Message from Thurmon Fair, MD sent at 09/11/2015  4:49 PM EDT ----- Regarding: RE: labs? She just had labs in July. Will order repeat labs at her next appointment. Thanks MCr ----- Message ----- From: Marzella Schlein Vaani Morren, CMA Sent: 09/11/2015   3:30 PM To: Thurmon Fair, MD Subject: labs?                                          Did you want labs for Ms Pocock?  Called about her script and her daughter mentioned not getting a lab slip yesterday.

## 2015-09-11 NOTE — Telephone Encounter (Signed)
Left detailed message per DPR  

## 2015-09-14 ENCOUNTER — Telehealth: Payer: Self-pay

## 2015-09-14 NOTE — Telephone Encounter (Signed)
Call to patient to reschedule ICM remote transmission.  Device was checked in office by Dr Royann Shivers on 09/10/2015.  Next ICM remote transmission is 10/17/2015.

## 2015-09-15 ENCOUNTER — Other Ambulatory Visit: Payer: Self-pay | Admitting: Cardiovascular Disease

## 2015-09-18 NOTE — Telephone Encounter (Signed)
Rx(s) sent to pharmacy electronically.  

## 2015-10-09 ENCOUNTER — Telehealth: Payer: Self-pay | Admitting: Cardiovascular Disease

## 2015-10-09 DIAGNOSIS — Z79899 Other long term (current) drug therapy: Secondary | ICD-10-CM

## 2015-10-09 DIAGNOSIS — R945 Abnormal results of liver function studies: Secondary | ICD-10-CM

## 2015-10-09 DIAGNOSIS — R7989 Other specified abnormal findings of blood chemistry: Secondary | ICD-10-CM | POA: Diagnosis not present

## 2015-10-09 NOTE — Telephone Encounter (Signed)
Pt is going to have lab work at [facility] in [place]. Please send an order for this please.

## 2015-10-09 NOTE — Telephone Encounter (Signed)
Called back daughter and left msg notifying lab orders ready and to call if questions.

## 2015-10-09 NOTE — Telephone Encounter (Signed)
Last HPF elevated - last labs in July by [name] - rec'd to cut amio and recheck HPF and BMET in 1 month

## 2015-10-10 ENCOUNTER — Ambulatory Visit (INDEPENDENT_AMBULATORY_CARE_PROVIDER_SITE_OTHER): Payer: PPO | Admitting: Internal Medicine

## 2015-10-10 ENCOUNTER — Other Ambulatory Visit (HOSPITAL_COMMUNITY): Payer: Self-pay | Admitting: Internal Medicine

## 2015-10-10 ENCOUNTER — Encounter (INDEPENDENT_AMBULATORY_CARE_PROVIDER_SITE_OTHER): Payer: Self-pay | Admitting: Internal Medicine

## 2015-10-10 VITALS — BP 160/70 | HR 56 | Temp 97.3°F | Ht 67.0 in | Wt 139.7 lb

## 2015-10-10 DIAGNOSIS — K5909 Other constipation: Secondary | ICD-10-CM

## 2015-10-10 DIAGNOSIS — Z13820 Encounter for screening for osteoporosis: Secondary | ICD-10-CM

## 2015-10-10 DIAGNOSIS — R634 Abnormal weight loss: Secondary | ICD-10-CM

## 2015-10-10 LAB — BASIC METABOLIC PANEL
BUN: 26 mg/dL — AB (ref 7–25)
CHLORIDE: 98 mmol/L (ref 98–110)
CO2: 25 mmol/L (ref 20–31)
CREATININE: 1.37 mg/dL — AB (ref 0.60–0.88)
Calcium: 8.7 mg/dL (ref 8.6–10.4)
Glucose, Bld: 211 mg/dL — ABNORMAL HIGH (ref 65–99)
POTASSIUM: 4.2 mmol/L (ref 3.5–5.3)
Sodium: 134 mmol/L — ABNORMAL LOW (ref 135–146)

## 2015-10-10 LAB — COMPREHENSIVE METABOLIC PANEL
ALBUMIN: 3.3 g/dL — AB (ref 3.6–5.1)
ALT: 14 U/L (ref 6–29)
AST: 22 U/L (ref 10–35)
Alkaline Phosphatase: 106 U/L (ref 33–130)
BILIRUBIN TOTAL: 0.6 mg/dL (ref 0.2–1.2)
BUN: 25 mg/dL (ref 7–25)
CALCIUM: 8.7 mg/dL (ref 8.6–10.4)
CHLORIDE: 98 mmol/L (ref 98–110)
CO2: 25 mmol/L (ref 20–31)
Creat: 1.48 mg/dL — ABNORMAL HIGH (ref 0.60–0.88)
GLUCOSE: 207 mg/dL — AB (ref 65–99)
POTASSIUM: 4.2 mmol/L (ref 3.5–5.3)
Sodium: 134 mmol/L — ABNORMAL LOW (ref 135–146)
Total Protein: 6.2 g/dL (ref 6.1–8.1)

## 2015-10-10 LAB — HEPATIC FUNCTION PANEL
ALT: 14 U/L (ref 6–29)
AST: 21 U/L (ref 10–35)
Albumin: 3.4 g/dL — ABNORMAL LOW (ref 3.6–5.1)
Alkaline Phosphatase: 105 U/L (ref 33–130)
BILIRUBIN INDIRECT: 0.5 mg/dL (ref 0.2–1.2)
Bilirubin, Direct: 0.1 mg/dL (ref <=0.2)
TOTAL PROTEIN: 6.2 g/dL (ref 6.1–8.1)
Total Bilirubin: 0.6 mg/dL (ref 0.2–1.2)

## 2015-10-10 NOTE — Patient Instructions (Signed)
cmet today.  OV in 6 months.

## 2015-10-10 NOTE — Progress Notes (Signed)
Subjective:    Patient ID: Kathleen [name], female    DOB: November 12, 1924, 82 y.o.   MRN: [id number]  HPI Here today for f/u. She says she has to strain to have a BM.  She takes Amitiza twice a day. She is having a BM everyday.  Her appetite is pretty good. She has lost about 10 pounds since her last visit.  She says she is eating 3 meals a day. She drinks Ensure twice a day. She eats 3 peanut butter crackers for lunch. Sometimes she will eat a tomato sandwich.  She eats one slice of toast for breakfast.    Weight in March 152.8. January 155. Today her weight is 139.7.   Hx significant for atrial fib and CAD and maintained on Eliquis. Hx of pacemaker      Review of Systems Past Medical History:  Diagnosis Date  . Acid reflux   . 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 CAD (coronary artery disease)   . Chronic kidney disease (CKD), stage III (moderate)   . Essential hypertension   . History of pneumonia   . Hyperlipidemia   . Nonischemic cardiomyopathy (HCC)   . PAF (paroxysmal atrial fibrillation) (HCC)   . Type 2 diabetes mellitus (HCC)     Past Surgical History:  Procedure Laterality Date  . Bladder tack  1970's  . CARDIAC CATHETERIZATION  10/15/2011   Normal coronaries  . CARDIOVERSION N/A 10/28/2013   Procedure: CARDIOVERSION;  Surgeon: Thurmon Fair, MD;  Location: [facility] ENDOSCOPY;  Service: Cardiovascular;  Laterality: N/A;  . CHOLECYSTECTOMY  1999  . LEFT AND RIGHT HEART CATHETERIZATION WITH CORONARY ANGIOGRAM N/A 10/15/2011   Procedure: LEFT AND RIGHT HEART CATHETERIZATION WITH CORONARY ANGIOGRAM;  Surgeon: Chrystie Nose, MD;  Location[facility] CATH LAB;  Service: Cardiovascular;  Laterality: N/A;  . NM MYOCAR PERF WALL MOTION  03/15/2009   Normal  . PACEMAKER INSERTION  10/16/2011   Medtronic  . PERMANENT PACEMAKER INSERTION N/A 03/24/2011   Procedure: PERMANENT PACEMAKER INSERTION;  Surgeon: Thurmon Fair, MD;  Location: [facility] CATH LAB;  Service:  Cardiovascular;  Laterality: N/A;  . REPLACEMENT TOTAL KNEE      Allergies  Allergen Reactions  . Coumadin [Warfarin Sodium] Other (See Comments)    Caused Patient to Bleed Out.   . Meloxicam Other (See Comments)    Unknown  . Metformin And Related Other (See Comments)    Cannot have due to kidney function  . Morphine Itching    Not in right state of mind.   . Nabumetone Nausea And Vomiting  . Oxycodone Nausea And Vomiting  . Sulfonamide Derivatives Hives    Current Outpatient Prescriptions on File Prior to Visit  Medication Sig Dispense Refill  . amiodarone (PACERONE) 200 MG tablet Take 0.5 tablets (100 mg total) by mouth daily. 30 tablet 10  . AMITIZA 8 MCG capsule TAKE (1) CAPSULE BY MOUTH TWICE DAILY. 60 capsule 5  . Coenzyme Q10 (CO Q 10 PO) Take 1 tablet by mouth daily.    Marland Kitchen ELIQUIS 2.5 MG TABS tablet TAKE ONE TABLET BY MOUTH TWICE DAILY. 60 tablet 5  . furosemide (LASIX) 40 MG tablet Take 40 mg by mouth daily. 3 lbs weight gain in 24 hours extra half tablet ( 20 mg )    . glimepiride (AMARYL) 1 MG tablet Take 0.5 mg by mouth as needed (for blood sugar levels over 120).     Marland Kitchen HYDROcodone-acetaminophen (NORCO/VICODIN) 5-325 MG  tablet Take 0.5-1 tablets by mouth every 4 (four) hours as needed for severe pain. 30 tablet 0  . metolazone (ZAROXOLYN) 2.5 MG tablet Take 2.5 mg by mouth daily. If patients weighs over 140 take 1 tablet (2.5 mg ) with extra half of lasix (20 mg )    . metoprolol succinate (TOPROL-XL) 25 MG 24 hr tablet TAKE 3 TABLETS BY MOUTH DAILY. 90 tablet 8  . Multiple Vitamins-Minerals (CENTRUM SILVER PO) Take 1 tablet by mouth every morning.     . Multiple Vitamins-Minerals (ICAPS AREDS 2) CAPS Take 1 capsule by mouth 2 (two) times daily.    . nitroGLYCERIN (NITROSTAT) 0.4 MG SL tablet DISSOLVE 1 TABLET UNDER TONGUE EVERY 5 MINUTES UP TO 15 MIN FOR CHESTPAIN. IF NO RELIEF CALL 911. 25 tablet 4  . omeprazole (PRILOSEC) 20 MG capsule TAKE ONE CAPSULE BY MOUTH DAILY.  30 capsule 6  . ondansetron (ZOFRAN) 4 MG tablet Take 4 mg by mouth every 4 (four) hours as needed for nausea or vomiting.    . potassium chloride (KLOR-CON) 8 MEQ tablet Take 1 tablet (8 mEq total) by mouth 3 (three) times daily. 270 tablet 3  . pravastatin (PRAVACHOL) 40 MG tablet TAKE 1 TABLET BY MOUTH EACH EVENING. 30 tablet 10  . [DISCONTINUED] benazepril (LOTENSIN) 40 MG tablet Take 40 mg by mouth 2 (two) times daily.     No current facility-administered medications on file prior to visit.        Objective:   Physical Exam Blood pressure (!) 160/70, pulse (!) 56, temperature 97.3 F (36.3 C), height 5\' 7"  (1.702 m), weight 139 lb 11.2 oz (63.4 kg).  Alert and oriented. Skin warm and dry. Oral mucosa is moist.   . Sclera anicteric, conjunctivae is pink. Thyroid not enlarged. No cervical lymphadenopathy. Lungs clear. Heart regular rate and rhythm.  Abdomen is soft. Bowel sounds are positive. No hepatomegaly. No abdominal masses felt. No tenderness.  No edema to lower extremities.    CBC    Component Value Date/Time   WBC 10.1 07/30/2015 1205   RBC 3.88 07/30/2015 1205   HGB 12.5 07/30/2015 1205   HCT 36.6 07/30/2015 1205   PLT 327 07/30/2015 1205   MCV 94.3 07/30/2015 1205   MCH 32.2 07/30/2015 1205   MCHC 34.2 07/30/2015 1205   RDW 14.5 07/30/2015 1205   LYMPHSABS 1.1 01/03/2015 1154   MONOABS 1.0 01/03/2015 1154   EOSABS 0.0 01/03/2015 1154   BASOSABS 0.0 01/03/2015 1154   Hepatic Function Panel     Component Value Date/Time   PROT 6.6 07/30/2015 1205   ALBUMIN 3.7 07/30/2015 1205   AST 43 (H) 07/30/2015 1205   ALT 43 (H) 07/30/2015 1205   ALKPHOS 136 (H) 07/30/2015 1205   BILITOT 0.6 07/30/2015 1205   BILIDIR 0.2 07/30/2015 1205   IBILI 0.4 07/30/2015 1205         Assessment & Plan:  Constipation. Am going to increase her Amitiza to 24mcg BID (samples given x 4 boxes) Weight loss: Have encouraged daughter to increase mother's caloric intake. OV in 6 months.

## 2015-10-11 ENCOUNTER — Ambulatory Visit (HOSPITAL_COMMUNITY)
Admission: RE | Admit: 2015-10-11 | Discharge: 2015-10-11 | Disposition: A | Discharge status: Home / Self Care | Payer: PPO | Source: Ambulatory Visit | Attending: Internal Medicine | Admitting: Internal Medicine

## 2015-10-11 DIAGNOSIS — Z13820 Encounter for screening for osteoporosis: Secondary | ICD-10-CM | POA: Insufficient documentation

## 2015-10-11 DIAGNOSIS — M810 Age-related osteoporosis without current pathological fracture: Secondary | ICD-10-CM | POA: Insufficient documentation

## 2015-10-17 ENCOUNTER — Encounter (HOSPITAL_COMMUNITY): Payer: Self-pay | Admitting: Emergency Medicine

## 2015-10-17 ENCOUNTER — Emergency Department (HOSPITAL_COMMUNITY)
Admission: EM | Admit: 2015-10-17 | Discharge: 2015-10-17 | Disposition: A | Discharge status: Home / Self Care | Payer: PPO | Attending: Emergency Medicine | Admitting: Emergency Medicine

## 2015-10-17 ENCOUNTER — Telehealth: Payer: Self-pay | Admitting: Cardiology

## 2015-10-17 DIAGNOSIS — N183 Chronic kidney disease, stage 3 (moderate): Secondary | ICD-10-CM | POA: Insufficient documentation

## 2015-10-17 DIAGNOSIS — I5043 Acute on chronic combined systolic (congestive) and diastolic (congestive) heart failure: Secondary | ICD-10-CM | POA: Diagnosis not present

## 2015-10-17 DIAGNOSIS — W268XXA Contact with other sharp object(s), not elsewhere classified, initial encounter: Secondary | ICD-10-CM | POA: Insufficient documentation

## 2015-10-17 DIAGNOSIS — Y999 Unspecified external cause status: Secondary | ICD-10-CM | POA: Insufficient documentation

## 2015-10-17 DIAGNOSIS — I130 Hypertensive heart and chronic kidney disease with heart failure and stage 1 through stage 4 chronic kidney disease, or unspecified chronic kidney disease: Secondary | ICD-10-CM | POA: Diagnosis not present

## 2015-10-17 DIAGNOSIS — Z79899 Other long term (current) drug therapy: Secondary | ICD-10-CM | POA: Diagnosis not present

## 2015-10-17 DIAGNOSIS — Z7984 Long term (current) use of oral hypoglycemic drugs: Secondary | ICD-10-CM | POA: Diagnosis not present

## 2015-10-17 DIAGNOSIS — Y929 Unspecified place or not applicable: Secondary | ICD-10-CM | POA: Diagnosis not present

## 2015-10-17 DIAGNOSIS — I2510 Atherosclerotic heart disease of native coronary artery without angina pectoris: Secondary | ICD-10-CM | POA: Diagnosis not present

## 2015-10-17 DIAGNOSIS — Y939 Activity, unspecified: Secondary | ICD-10-CM | POA: Insufficient documentation

## 2015-10-17 DIAGNOSIS — E1122 Type 2 diabetes mellitus with diabetic chronic kidney disease: Secondary | ICD-10-CM | POA: Insufficient documentation

## 2015-10-17 DIAGNOSIS — S61216A Laceration without foreign body of right little finger without damage to nail, initial encounter: Secondary | ICD-10-CM | POA: Diagnosis not present

## 2015-10-17 MED ORDER — "THROMBI-PAD 3""X3"" EX PADS"
MEDICATED_PAD | CUTANEOUS | Status: AC
  Filled 2015-10-17: qty 1

## 2015-10-17 NOTE — ED Provider Notes (Signed)
[facility] DEPT Provider Note   CSN: [id number] Arrival date & time: 10/17/15  1759     History   Chief Complaint Chief Complaint  Patient presents with  . Laceration    HPI Kathleen [name] is a 82 y.o. female.  HPI  Kathleen [name] is a 82 y.o. female who takes Eliquis secondary to paroxysmal a fib, presents to the Emergency Department complaining of persistent bleeding from a laceration to her right fifth finger.  She states that she cut her finger on a piece of metal one day prior to arrival and states that her finger has continued to slowly bleed.  She has applied bandages to control the bleeding.  She denies pain, numbness, or pain with movement of the finger, also denies weakness or dizziness.    Past Medical History:  Diagnosis Date  . Acid reflux   . 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 CAD (coronary artery disease)   . Chronic kidney disease (CKD), stage III (moderate)   . Essential hypertension   . History of pneumonia   . Hyperlipidemia   . Nonischemic cardiomyopathy (HCC)   . PAF (paroxysmal atrial fibrillation) (HCC)   . Type 2 diabetes mellitus [facility])     Patient Active Problem List   Diagnosis Date Noted  . Acute on chronic combined systolic and diastolic CHF, NYHA class 1 (HCC) 05/02/2015  . Acute on chronic diastolic CHF (congestive heart failure) (HCC) 05/01/2015  . Pain in the chest   . Chest pain 04/30/2015  . Pelvis fracture, closed, initial encounter 02/19/2015  . Distal radial epiphysitis 02/19/2015  . Hypercortisolemia (HCC) 10/03/2014  . UTI (urinary tract infection) 10/05/2013  . Atrial fibrillation with RVR (HCC) 10/04/2013  . Hypoxia 10/04/2013  . Generalized weakness 10/04/2013  . Chronic combined systolic and diastolic CHF (congestive heart failure) (HCC) 10/20/2012  . CKD (chronic kidney disease) stage 3, GFR 30-59 ml/min 10/20/2012  . Cardiomyopathy, dilated, nonischemic 10/14/2011  . CRT - pacemaker  Medtronic 10/14/2011  . Hypokalemia 10/12/2011  . Acute systolic CHF (congestive heart failure) (HCC) 10/11/2011  . Acute renal insufficiency, Scr was WNL in April 2013 10/11/2011  . Anemia 10/11/2011  . Unstable angina (HCC) 10/11/2011  . Gait abnormality 05/13/2011  . Syncope 03/25/2011  . COPD on CXR 03/25/2011  . Coronary atherosclerosis, no significant obstructive lesions 03/25/2011  . Symptomatic advanced heart block, 2:1 AV block, RT BBB, Lt post. fas. block.(March 2013) 03/23/2011  . Paroxysmal atrial fibrillation (HCC) 03/23/2011  . Lung nodule 12/13/2010  . DM type 2 (diabetes mellitus, type 2) (HCC) 12/13/2010  . Hyperlipidemia 12/13/2010  . TOTAL KNEE FOLLOW-UP 06/20/2009  . MONONEURITIS, LEG 02/19/2009  . KNEE, ARTHRITIS, DEGEN./OSTEO 11/30/2008  . SPINAL STENOSIS 07/04/2008  . HEMARTHROSIS, LOWER LEG 05/10/2008  . DIABETES 05/03/2008  . Effusion of lower leg joint 05/03/2008  . KNEE PAIN 05/03/2008  . Hypertension 05/03/2008    Past Surgical History:  Procedure Laterality Date  . Bladder tack  1970's  . CARDIAC CATHETERIZATION  10/15/2011   Normal coronaries  . CARDIOVERSION N/A 10/28/2013   Procedure: CARDIOVERSION;  Surgeon: Thurmon Fair, MD;  Location: [facility];  Service: Cardiovascular;  Laterality: N/A;  . CHOLECYSTECTOMY  1999  . LEFT AND RIGHT HEART CATHETERIZATION WITH CORONARY ANGIOGRAM N/A 10/15/2011   Procedure: LEFT AND RIGHT HEART CATHETERIZATION WITH CORONARY ANGIOGRAM;  Surgeon: Chrystie Nose, MD;  Location: [facility] CATH LAB;  Service: Cardiovascular;  Laterality: N/A;  . NM  MYOCAR PERF WALL MOTION  03/15/2009   Normal  . PACEMAKER INSERTION  10/16/2011   Medtronic  . PERMANENT PACEMAKER INSERTION N/A 03/24/2011   Procedure: PERMANENT PACEMAKER INSERTION;  Surgeon: Thurmon Fair, MD;  Location: [facility] CATH LAB;  Service: Cardiovascular;  Laterality: N/A;  . REPLACEMENT TOTAL KNEE      OB History    No data available       Home Medications     Prior to Admission medications   Medication Sig Start Date End Date Taking? Authorizing Provider  amiodarone (PACERONE) 200 MG tablet Take 0.5 tablets (100 mg total) by mouth daily. 07/30/15  Yes Rosalio Macadamia, NP  Coenzyme Q10 (CO Q 10 PO) Take 1 tablet by mouth daily.   Yes Historical Provider, MD  ELIQUIS 2.5 MG TABS tablet TAKE ONE TABLET BY MOUTH TWICE DAILY. 07/09/15  Yes Mihai Croitoru, MD  furosemide (LASIX) 40 MG tablet Take 40 mg by mouth daily. 3 lbs weight gain in 24 hours extra half tablet ( 20 mg )   Yes Historical Provider, MD  glimepiride (AMARYL) 1 MG tablet Take 0.5 mg by mouth as needed (for blood sugar levels over 120).    Yes Historical Provider, MD  HYDROcodone-acetaminophen (NORCO/VICODIN) 5-325 MG tablet Take 0.5-1 tablets by mouth every 4 (four) hours as needed for severe pain. 09/11/15  Yes Mihai Croitoru, MD  lubiprostone (AMITIZA) 24 MCG capsule Take 24 mcg by mouth 2 (two) times daily with a meal.   Yes Historical Provider, MD  meclizine (ANTIVERT) 25 MG tablet Take 25 mg by mouth 3 (three) times daily as needed for dizziness.   Yes Historical Provider, MD  metolazone (ZAROXOLYN) 2.5 MG tablet Take 2.5 mg by mouth daily. If patients weighs over 140 take 1 tablet (2.5 mg ) with extra half of lasix (20 mg )   Yes Historical Provider, MD  metoprolol succinate (TOPROL-XL) 25 MG 24 hr tablet TAKE 3 TABLETS BY MOUTH DAILY. 09/18/15  Yes Mihai Croitoru, MD  Multiple Vitamins-Minerals (CENTRUM SILVER PO) Take 1 tablet by mouth every morning.    Yes Historical Provider, MD  Multiple Vitamins-Minerals (ICAPS AREDS 2) CAPS Take 1 capsule by mouth 2 (two) times daily.   Yes Historical Provider, MD  nitroGLYCERIN (NITROSTAT) 0.4 MG SL tablet DISSOLVE 1 TABLET UNDER TONGUE EVERY 5 MINUTES UP TO 15 MIN FOR CHESTPAIN. IF NO RELIEF CALL 911. 12/25/14  Yes Mihai Croitoru, MD  omeprazole (PRILOSEC) 20 MG capsule TAKE ONE CAPSULE BY MOUTH DAILY. 06/18/15  Yes Mihai Croitoru, MD  ondansetron  (ZOFRAN) 4 MG tablet Take 4 mg by mouth every 4 (four) hours as needed for nausea or vomiting.   Yes Historical Provider, MD  potassium chloride (KLOR-CON) 8 MEQ tablet Take 1 tablet (8 mEq total) by mouth 3 (three) times daily. 05/08/15  Yes Mihai Croitoru, MD  pravastatin (PRAVACHOL) 40 MG tablet TAKE 1 TABLET BY MOUTH EACH EVENING. 06/06/15  Yes Mihai Croitoru, MD  AMITIZA 8 MCG capsule TAKE (1) CAPSULE BY MOUTH TWICE DAILY. Patient not taking: Reported on 10/17/2015 09/10/15   Malissa Hippo, MD    Family History Family History  Problem Relation Age of Onset  . Suicidality Mother   . Heart murmur Daughter   . Heart attack Brother     Social History Social History  Substance Use Topics  . Smoking status: Never Smoker  . Smokeless tobacco: Never Used  . Alcohol use No     Allergies   Coumadin [warfarin sodium];  Meloxicam; Metformin and related; Morphine; Nabumetone; Oxycodone; and Sulfonamide derivatives   Review of Systems Review of Systems  Constitutional: Negative for chills and fever.  Respiratory: Negative for shortness of breath.   Cardiovascular: Negative for chest pain.  Musculoskeletal: Negative for arthralgias, back pain and joint swelling.  Skin: Positive for wound. Negative for color change.       Laceration right fifth finger  Neurological: Negative for dizziness, syncope, weakness and numbness.  Hematological: Bruises/bleeds easily.  All other systems reviewed and are negative.    Physical Exam Updated Vital Signs BP 137/80 (BP Location: Right Arm)   Pulse 88   Temp 99.3 F (37.4 C) (Oral)   Resp 18   Ht 5\' 5"  (1.651 m)   Wt 62.4 kg   SpO2 97%   BMI 22.90 kg/m   Physical Exam  Constitutional: She is oriented to person, place, and time. She appears well-developed and well-nourished. No distress.  HENT:  Head: Normocephalic and atraumatic.  Cardiovascular: Normal rate, regular rhythm and intact distal pulses.   No murmur heard. Pulmonary/Chest:  Effort normal and breath sounds normal. No respiratory distress.  Musculoskeletal: She exhibits no edema or tenderness.  Pea sized avulsion of the skin to the lateral right fifth finger with mild bleeding present.  Distal sensation intact.  Skin warm and pink.  CR< 2 sec.   Neurological: She is alert and oriented to person, place, and time. She exhibits normal muscle tone. Coordination normal.  Skin: Skin is warm. Capillary refill takes less than 2 seconds. Laceration noted. No pallor.  Nursing note and vitals reviewed.    ED Treatments / Results  Labs (all labs ordered are listed, but only abnormal results are displayed) Labs Reviewed - No data to display  EKG  EKG Interpretation None       Radiology No results found.  Procedures Procedures (including critical care time)  Medications Ordered in ED Medications  THROMBI-PAD (THROMBI-PAD) 3"X3" pad (not administered)     Initial Impression / Assessment and Plan / ED Course  I have reviewed the triage vital signs and the nursing notes.  Pertinent labs & imaging results that were available during my care of the patient were reviewed by me and considered in my medical decision making (see chart for details).  Clinical Course   Wound not sutureable.  cleaned with saline.  Quick clot pad applied along with pressure dressing.  pateint observed without further bleeding.  Td UTD  Pt advised to return if bleeding persist.  Remains NV intact   Pt also seen by Dr. Estell HarpinZammit and care plan discussed.   Final Clinical Impressions(s) / ED Diagnoses   Final diagnoses:  Laceration of right little finger without foreign body without damage to nail, initial encounter    New Prescriptions New Prescriptions   No medications on file     Pauline Ausammy Kimyata Milich, Cordelia Poche-C 10/22/15 1705    Bethann BerkshireJoseph Zammit, MD 10/22/15 2225

## 2015-10-17 NOTE — Discharge Instructions (Signed)
Keep the bandage in place for 2 days.  Remove it gently.  Return here if needed.

## 2015-10-17 NOTE — Telephone Encounter (Signed)
Spoke with pt and reminded pt of remote transmission that is due today. Pt verbalized understanding.   

## 2015-10-17 NOTE — ED Triage Notes (Signed)
Laceration to right pinky from metal mandolin yesterday. Last tetanus x 2 years ago.

## 2015-10-18 ENCOUNTER — Telehealth: Payer: Self-pay

## 2015-10-18 NOTE — Telephone Encounter (Signed)
Daughter left message to return call regarding home monitor.  Returned call to patients daughter and she stated she had to change outlet to get the monitor to charge.  The problem has been fixed and will send ICM remote transmission today.

## 2015-10-19 ENCOUNTER — Other Ambulatory Visit (HOSPITAL_COMMUNITY): Payer: PPO

## 2015-10-23 NOTE — Progress Notes (Signed)
No ICM remote transmission received on 10/17/2015.   Next ICM transmission scheduled for 11/07/2015.

## 2015-11-01 ENCOUNTER — Encounter (HOSPITAL_COMMUNITY): Payer: Self-pay | Admitting: *Deleted

## 2015-11-01 ENCOUNTER — Emergency Department (HOSPITAL_COMMUNITY): Payer: PPO

## 2015-11-01 ENCOUNTER — Inpatient Hospital Stay (HOSPITAL_COMMUNITY): Payer: PPO

## 2015-11-01 ENCOUNTER — Inpatient Hospital Stay (HOSPITAL_COMMUNITY)
Admission: EM | Admit: 2015-11-01 | Discharge: 2015-11-07 | DRG: 481 | Disposition: A | Discharge status: Skilled Nursing Facility | Payer: PPO | Attending: Internal Medicine | Admitting: Internal Medicine

## 2015-11-01 DIAGNOSIS — S7001XA Contusion of right hip, initial encounter: Secondary | ICD-10-CM | POA: Diagnosis not present

## 2015-11-01 DIAGNOSIS — B961 Klebsiella pneumoniae [K. pneumoniae] as the cause of diseases classified elsewhere: Secondary | ICD-10-CM | POA: Diagnosis not present

## 2015-11-01 DIAGNOSIS — N183 Chronic kidney disease, stage 3 (moderate): Secondary | ICD-10-CM | POA: Diagnosis present

## 2015-11-01 DIAGNOSIS — E7800 Pure hypercholesterolemia, unspecified: Secondary | ICD-10-CM | POA: Diagnosis not present

## 2015-11-01 DIAGNOSIS — E1122 Type 2 diabetes mellitus with diabetic chronic kidney disease: Secondary | ICD-10-CM | POA: Diagnosis present

## 2015-11-01 DIAGNOSIS — M25559 Pain in unspecified hip: Secondary | ICD-10-CM | POA: Diagnosis not present

## 2015-11-01 DIAGNOSIS — Z79899 Other long term (current) drug therapy: Secondary | ICD-10-CM

## 2015-11-01 DIAGNOSIS — Z66 Do not resuscitate: Secondary | ICD-10-CM | POA: Diagnosis present

## 2015-11-01 DIAGNOSIS — W1830XA Fall on same level, unspecified, initial encounter: Secondary | ICD-10-CM | POA: Diagnosis present

## 2015-11-01 DIAGNOSIS — Z888 Allergy status to other drugs, medicaments and biological substances status: Secondary | ICD-10-CM

## 2015-11-01 DIAGNOSIS — I429 Cardiomyopathy, unspecified: Secondary | ICD-10-CM | POA: Diagnosis not present

## 2015-11-01 DIAGNOSIS — R279 Unspecified lack of coordination: Secondary | ICD-10-CM | POA: Diagnosis not present

## 2015-11-01 DIAGNOSIS — N179 Acute kidney failure, unspecified: Secondary | ICD-10-CM | POA: Diagnosis not present

## 2015-11-01 DIAGNOSIS — Z882 Allergy status to sulfonamides status: Secondary | ICD-10-CM

## 2015-11-01 DIAGNOSIS — I5042 Chronic combined systolic (congestive) and diastolic (congestive) heart failure: Secondary | ICD-10-CM | POA: Diagnosis not present

## 2015-11-01 DIAGNOSIS — I129 Hypertensive chronic kidney disease with stage 1 through stage 4 chronic kidney disease, or unspecified chronic kidney disease: Secondary | ICD-10-CM | POA: Diagnosis not present

## 2015-11-01 DIAGNOSIS — I420 Dilated cardiomyopathy: Secondary | ICD-10-CM | POA: Diagnosis not present

## 2015-11-01 DIAGNOSIS — S72141A Displaced intertrochanteric fracture of right femur, initial encounter for closed fracture: Secondary | ICD-10-CM | POA: Diagnosis not present

## 2015-11-01 DIAGNOSIS — M25551 Pain in right hip: Secondary | ICD-10-CM | POA: Diagnosis not present

## 2015-11-01 DIAGNOSIS — Z885 Allergy status to narcotic agent status: Secondary | ICD-10-CM

## 2015-11-01 DIAGNOSIS — W19XXXA Unspecified fall, initial encounter: Secondary | ICD-10-CM

## 2015-11-01 DIAGNOSIS — I480 Paroxysmal atrial fibrillation: Secondary | ICD-10-CM | POA: Diagnosis present

## 2015-11-01 DIAGNOSIS — R262 Difficulty in walking, not elsewhere classified: Secondary | ICD-10-CM | POA: Diagnosis not present

## 2015-11-01 DIAGNOSIS — D62 Acute posthemorrhagic anemia: Secondary | ICD-10-CM | POA: Diagnosis not present

## 2015-11-01 DIAGNOSIS — I2510 Atherosclerotic heart disease of native coronary artery without angina pectoris: Secondary | ICD-10-CM | POA: Diagnosis present

## 2015-11-01 DIAGNOSIS — E119 Type 2 diabetes mellitus without complications: Secondary | ICD-10-CM

## 2015-11-01 DIAGNOSIS — I10 Essential (primary) hypertension: Secondary | ICD-10-CM | POA: Diagnosis present

## 2015-11-01 DIAGNOSIS — Z01818 Encounter for other preprocedural examination: Secondary | ICD-10-CM | POA: Diagnosis not present

## 2015-11-01 DIAGNOSIS — I428 Other cardiomyopathies: Secondary | ICD-10-CM | POA: Diagnosis present

## 2015-11-01 DIAGNOSIS — Z950 Presence of cardiac pacemaker: Secondary | ICD-10-CM | POA: Diagnosis not present

## 2015-11-01 DIAGNOSIS — S72009A Fracture of unspecified part of neck of unspecified femur, initial encounter for closed fracture: Secondary | ICD-10-CM | POA: Diagnosis present

## 2015-11-01 DIAGNOSIS — R293 Abnormal posture: Secondary | ICD-10-CM | POA: Diagnosis not present

## 2015-11-01 DIAGNOSIS — S72111A Displaced fracture of greater trochanter of right femur, initial encounter for closed fracture: Secondary | ICD-10-CM | POA: Diagnosis not present

## 2015-11-01 DIAGNOSIS — E440 Moderate protein-calorie malnutrition: Secondary | ICD-10-CM | POA: Diagnosis not present

## 2015-11-01 DIAGNOSIS — N390 Urinary tract infection, site not specified: Secondary | ICD-10-CM | POA: Diagnosis present

## 2015-11-01 DIAGNOSIS — Z951 Presence of aortocoronary bypass graft: Secondary | ICD-10-CM

## 2015-11-01 DIAGNOSIS — E785 Hyperlipidemia, unspecified: Secondary | ICD-10-CM | POA: Diagnosis not present

## 2015-11-01 DIAGNOSIS — K5900 Constipation, unspecified: Secondary | ICD-10-CM | POA: Diagnosis not present

## 2015-11-01 DIAGNOSIS — S72001A Fracture of unspecified part of neck of right femur, initial encounter for closed fracture: Secondary | ICD-10-CM | POA: Diagnosis not present

## 2015-11-01 DIAGNOSIS — Z23 Encounter for immunization: Secondary | ICD-10-CM

## 2015-11-01 DIAGNOSIS — M6281 Muscle weakness (generalized): Secondary | ICD-10-CM | POA: Diagnosis not present

## 2015-11-01 DIAGNOSIS — I130 Hypertensive heart and chronic kidney disease with heart failure and stage 1 through stage 4 chronic kidney disease, or unspecified chronic kidney disease: Secondary | ICD-10-CM | POA: Diagnosis not present

## 2015-11-01 DIAGNOSIS — Z6822 Body mass index (BMI) 22.0-22.9, adult: Secondary | ICD-10-CM

## 2015-11-01 DIAGNOSIS — Y92009 Unspecified place in unspecified non-institutional (private) residence as the place of occurrence of the external cause: Secondary | ICD-10-CM

## 2015-11-01 DIAGNOSIS — W1789XA Other fall from one level to another, initial encounter: Secondary | ICD-10-CM | POA: Diagnosis not present

## 2015-11-01 DIAGNOSIS — R140 Abdominal distension (gaseous): Secondary | ICD-10-CM | POA: Diagnosis not present

## 2015-11-01 DIAGNOSIS — D649 Anemia, unspecified: Secondary | ICD-10-CM | POA: Diagnosis not present

## 2015-11-01 DIAGNOSIS — J449 Chronic obstructive pulmonary disease, unspecified: Secondary | ICD-10-CM | POA: Diagnosis not present

## 2015-11-01 DIAGNOSIS — M4800 Spinal stenosis, site unspecified: Secondary | ICD-10-CM | POA: Diagnosis not present

## 2015-11-01 DIAGNOSIS — Z7901 Long term (current) use of anticoagulants: Secondary | ICD-10-CM | POA: Diagnosis not present

## 2015-11-01 DIAGNOSIS — S299XXA Unspecified injury of thorax, initial encounter: Secondary | ICD-10-CM | POA: Diagnosis not present

## 2015-11-01 DIAGNOSIS — Z7984 Long term (current) use of oral hypoglycemic drugs: Secondary | ICD-10-CM | POA: Diagnosis not present

## 2015-11-01 DIAGNOSIS — E871 Hypo-osmolality and hyponatremia: Secondary | ICD-10-CM | POA: Diagnosis not present

## 2015-11-01 DIAGNOSIS — K219 Gastro-esophageal reflux disease without esophagitis: Secondary | ICD-10-CM | POA: Diagnosis present

## 2015-11-01 DIAGNOSIS — M179 Osteoarthritis of knee, unspecified: Secondary | ICD-10-CM | POA: Diagnosis not present

## 2015-11-01 DIAGNOSIS — Z9181 History of falling: Secondary | ICD-10-CM | POA: Diagnosis not present

## 2015-11-01 DIAGNOSIS — S72141D Displaced intertrochanteric fracture of right femur, subsequent encounter for closed fracture with routine healing: Secondary | ICD-10-CM | POA: Diagnosis not present

## 2015-11-01 DIAGNOSIS — R079 Chest pain, unspecified: Secondary | ICD-10-CM | POA: Diagnosis not present

## 2015-11-01 DIAGNOSIS — G587 Mononeuritis multiplex: Secondary | ICD-10-CM | POA: Diagnosis not present

## 2015-11-01 DIAGNOSIS — M79605 Pain in left leg: Secondary | ICD-10-CM | POA: Diagnosis not present

## 2015-11-01 DIAGNOSIS — R55 Syncope and collapse: Secondary | ICD-10-CM | POA: Diagnosis not present

## 2015-11-01 DIAGNOSIS — Z01811 Encounter for preprocedural respiratory examination: Secondary | ICD-10-CM

## 2015-11-01 DIAGNOSIS — Z8249 Family history of ischemic heart disease and other diseases of the circulatory system: Secondary | ICD-10-CM

## 2015-11-01 DIAGNOSIS — T148XXA Other injury of unspecified body region, initial encounter: Secondary | ICD-10-CM

## 2015-11-01 LAB — URINALYSIS, ROUTINE W REFLEX MICROSCOPIC
Bilirubin Urine: NEGATIVE
Glucose, UA: NEGATIVE mg/dL
Hgb urine dipstick: NEGATIVE
Ketones, ur: NEGATIVE mg/dL
Leukocytes, UA: NEGATIVE
Nitrite: NEGATIVE
Protein, ur: NEGATIVE mg/dL
Specific Gravity, Urine: 1.015 (ref 1.005–1.030)
pH: 6 (ref 5.0–8.0)

## 2015-11-01 LAB — COMPREHENSIVE METABOLIC PANEL
ALT: 22 U/L (ref 14–54)
AST: 24 U/L (ref 15–41)
Albumin: 3.4 g/dL — ABNORMAL LOW (ref 3.5–5.0)
Alkaline Phosphatase: 120 U/L (ref 38–126)
Anion gap: 8 (ref 5–15)
BILIRUBIN TOTAL: 0.8 mg/dL (ref 0.3–1.2)
BUN: 25 mg/dL — AB (ref 6–20)
CALCIUM: 8.6 mg/dL — AB (ref 8.9–10.3)
CO2: 28 mmol/L (ref 22–32)
CREATININE: 1.24 mg/dL — AB (ref 0.44–1.00)
Chloride: 96 mmol/L — ABNORMAL LOW (ref 101–111)
GFR calc Af Amer: 44 mL/min — ABNORMAL LOW (ref >60)
GFR, EST NON AFRICAN AMERICAN: 38 mL/min — AB (ref >60)
Glucose, Bld: 180 mg/dL — ABNORMAL HIGH (ref 65–99)
Potassium: 4.1 mmol/L (ref 3.5–5.1)
Sodium: 132 mmol/L — ABNORMAL LOW (ref 135–145)
TOTAL PROTEIN: 6.7 g/dL (ref 6.5–8.1)

## 2015-11-01 LAB — GLUCOSE, CAPILLARY: GLUCOSE-CAPILLARY: 173 mg/dL — AB (ref 65–99)

## 2015-11-01 LAB — CBC WITH DIFFERENTIAL/PLATELET
Basophils Absolute: 0 K/uL (ref 0.0–0.1)
Basophils Relative: 0 %
Eosinophils Absolute: 0.1 K/uL (ref 0.0–0.7)
Eosinophils Relative: 1 %
HCT: 35.3 % — ABNORMAL LOW (ref 36.0–46.0)
Hemoglobin: 11.5 g/dL — ABNORMAL LOW (ref 12.0–15.0)
Lymphocytes Relative: 7 %
Lymphs Abs: 0.8 K/uL (ref 0.7–4.0)
MCH: 32 pg (ref 26.0–34.0)
MCHC: 32.6 g/dL (ref 30.0–36.0)
MCV: 98.3 fL (ref 78.0–100.0)
Monocytes Absolute: 0.8 K/uL (ref 0.1–1.0)
Monocytes Relative: 7 %
Neutro Abs: 10.1 K/uL — ABNORMAL HIGH (ref 1.7–7.7)
Neutrophils Relative %: 85 %
Platelets: 263 K/uL (ref 150–400)
RBC: 3.59 MIL/uL — ABNORMAL LOW (ref 3.87–5.11)
RDW: 14.6 % (ref 11.5–15.5)
WBC: 11.8 K/uL — ABNORMAL HIGH (ref 4.0–10.5)

## 2015-11-01 LAB — PROTIME-INR
INR: 1.56
Prothrombin Time: 18.8 s — ABNORMAL HIGH (ref 11.4–15.2)

## 2015-11-01 MED ORDER — CO Q 10 10 MG PO CAPS: ORAL_CAPSULE | Freq: Every day | ORAL | Status: DC

## 2015-11-01 MED ORDER — DEXTROSE-NACL 5-0.9 % IV SOLN
INTRAVENOUS | Status: DC
  Administered 2015-11-01: 21:00:00 via INTRAVENOUS

## 2015-11-01 MED ORDER — ENSURE ENLIVE PO LIQD
237.0000 mL | Freq: Two times a day (BID) | ORAL | Status: DC
  Administered 2015-11-02 – 2015-11-06 (×8): 237 mL via ORAL

## 2015-11-01 MED ORDER — SODIUM CHLORIDE 0.9 % IV SOLN
INTRAVENOUS | Status: DC
  Administered 2015-11-01: 17:00:00 via INTRAVENOUS

## 2015-11-01 MED ORDER — INFLUENZA VAC SPLIT QUAD 0.5 ML IM SUSY
0.5000 mL | PREFILLED_SYRINGE | INTRAMUSCULAR | Status: AC
  Administered 2015-11-02: 0.5 mL via INTRAMUSCULAR
  Filled 2015-11-01: qty 0.5

## 2015-11-01 MED ORDER — ONDANSETRON HCL 4 MG/2ML IJ SOLN
4.0000 mg | Freq: Once | INTRAMUSCULAR | Status: AC
  Administered 2015-11-01: 4 mg via INTRAVENOUS
  Filled 2015-11-01: qty 2

## 2015-11-01 MED ORDER — ACETAMINOPHEN 325 MG PO TABS
650.0000 mg | ORAL_TABLET | Freq: Four times a day (QID) | ORAL | Status: DC | PRN
Start: 2015-11-01 — End: 2015-11-07
  Administered 2015-11-05: 650 mg via ORAL
  Filled 2015-11-01 (×2): qty 2

## 2015-11-01 MED ORDER — PRAVASTATIN SODIUM 40 MG PO TABS
40.0000 mg | ORAL_TABLET | Freq: Every day | ORAL | Status: DC
  Administered 2015-11-01 – 2015-11-07 (×6): 40 mg via ORAL
  Filled 2015-11-01 (×6): qty 1

## 2015-11-01 MED ORDER — FENTANYL CITRATE (PF) 100 MCG/2ML IJ SOLN
50.0000 ug | Freq: Once | INTRAMUSCULAR | Status: AC
  Administered 2015-11-01: 50 ug via INTRAVENOUS
  Filled 2015-11-01: qty 2

## 2015-11-01 MED ORDER — FENTANYL CITRATE (PF) 100 MCG/2ML IJ SOLN
50.0000 ug | INTRAMUSCULAR | Status: DC | PRN
  Administered 2015-11-01 – 2015-11-02 (×9): 50 ug via INTRAVENOUS
  Filled 2015-11-01 (×4): qty 2

## 2015-11-01 MED ORDER — METOPROLOL SUCCINATE ER 50 MG PO TB24
75.0000 mg | ORAL_TABLET | Freq: Every day | ORAL | Status: DC
  Administered 2015-11-02 – 2015-11-07 (×5): 75 mg via ORAL
  Filled 2015-11-01 (×5): qty 1

## 2015-11-01 MED ORDER — PANTOPRAZOLE SODIUM 40 MG PO TBEC
40.0000 mg | DELAYED_RELEASE_TABLET | Freq: Every day | ORAL | Status: DC
  Administered 2015-11-02 – 2015-11-07 (×5): 40 mg via ORAL
  Filled 2015-11-01 (×5): qty 1

## 2015-11-01 MED ORDER — FENTANYL CITRATE (PF) 100 MCG/2ML IJ SOLN
50.0000 ug | INTRAMUSCULAR | Status: DC | PRN
  Administered 2015-11-01 – 2015-11-02 (×3): 50 ug via INTRAVENOUS
  Filled 2015-11-01 (×8): qty 2

## 2015-11-01 MED ORDER — ACETAMINOPHEN 650 MG RE SUPP: 650.0000 mg | Freq: Four times a day (QID) | RECTAL | Status: DC | PRN

## 2015-11-01 MED ORDER — AMIODARONE HCL 100 MG PO TABS
100.0000 mg | ORAL_TABLET | Freq: Every day | ORAL | Status: DC
  Administered 2015-11-02 – 2015-11-07 (×5): 100 mg via ORAL
  Filled 2015-11-01 (×5): qty 1

## 2015-11-01 NOTE — ED Notes (Signed)
Dr Le at bedside,  

## 2015-11-01 NOTE — Consult Note (Signed)
UNDEFINED FRACTURE RIGHT HIP I VE ORDERED CT TO DETERMINE IF FIXATION NEEDED STOP ELOQUIS  SURGERY CAN PROCEED 48 HRS AFTER LAST DOSE

## 2015-11-01 NOTE — ED Provider Notes (Signed)
[facility] DE[facility] Provider Note   CSN: [id number] Arrival date & time: 11/01/15  1546     History   Chief Complaint No chief complaint on file.   HPI Kathleen [name] is a 82 y.o. female.  Pt presents to the ED today with right hip pain after a fall.  The pt said that she was on a step stool dusting the fan when she fell.  She denies injury elsewhere.  She said her hip only hurts when she moves it.  She does have a hx of a.fib ([place] VASc score:  6) and is on Eliquis for that.      Past Medical History:  Diagnosis Date  . Acid reflux   . 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 CAD (coronary artery disease)   . Chronic kidney disease (CKD), stage III (moderate)   . Essential hypertension   . History of pneumonia   . Hyperlipidemia   . Nonischemic cardiomyopathy (HCC)   . PAF (paroxysmal atrial fibrillation) (HCC)   . Type 2 diabetes mellitus [facility])     Patient Active Problem List   Diagnosis Date Noted  . Acute on chronic combined systolic and diastolic CHF, NYHA class 1 (HCC) 05/02/2015  . Acute on chronic diastolic CHF (congestive heart failure) (HCC) 05/01/2015  . Pain in the chest   . Chest pain 04/30/2015  . Pelvis fracture, closed, initial encounter 02/19/2015  . Distal radial epiphysitis 02/19/2015  . Hypercortisolemia (HCC) 10/03/2014  . UTI (urinary tract infection) 10/05/2013  . Atrial fibrillation with RVR (HCC) 10/04/2013  . Hypoxia 10/04/2013  . Generalized weakness 10/04/2013  . Chronic combined systolic and diastolic CHF (congestive heart failure) (HCC) 10/20/2012  . CKD (chronic kidney disease) stage 3, GFR 30-59 ml/min 10/20/2012  . Cardiomyopathy, dilated, nonischemic 10/14/2011  . CRT - pacemaker Medtronic 10/14/2011  . Hypokalemia 10/12/2011  . Acute systolic CHF (congestive heart failure) (HCC) 10/11/2011  . Acute renal insufficiency, Scr was WNL in April 2013 10/11/2011  . Anemia 10/11/2011  . Unstable angina (HCC)  10/11/2011  . Gait abnormality 05/13/2011  . Syncope 03/25/2011  . COPD on CXR 03/25/2011  . Coronary atherosclerosis, no significant obstructive lesions 03/25/2011  . Symptomatic advanced heart block, 2:1 AV block, RT BBB, Lt post. fas. block.(March 2013) 03/23/2011  . Paroxysmal atrial fibrillation (HCC) 03/23/2011  . Lung nodule 12/13/2010  . DM type 2 (diabetes mellitus, type 2) (HCC) 12/13/2010  . Hyperlipidemia 12/13/2010  . TOTAL KNEE FOLLOW-UP 06/20/2009  . MONONEURITIS, LEG 02/19/2009  . KNEE, ARTHRITIS, DEGEN./OSTEO 11/30/2008  . SPINAL STENOSIS 07/04/2008  . HEMARTHROSIS, LOWER LEG 05/10/2008  . DIABETES 05/03/2008  . Effusion of lower leg joint 05/03/2008  . KNEE PAIN 05/03/2008  . Hypertension 05/03/2008    Past Surgical History:  Procedure Laterality Date  . Bladder tack  1970's  . CARDIAC CATHETERIZATION  10/15/2011   Normal coronaries  . CARDIOVERSION N/A 10/28/2013   Procedure: CARDIOVERSION;  Surgeon: Thurmon Fair, MD;  Location: [facility] ENDOSCOPY;  Service: Cardiovascular;  Laterality: N/A;  . CHOLECYSTECTOMY  1999  . LEFT AND RIGHT HEART CATHETERIZATION WITH CORONARY ANGIOGRAM N/A 10/15/2011   Procedure: LEFT AND RIGHT HEART CATHETERIZATION WITH CORONARY ANGIOGRAM;  Surgeon: Chrystie Nose, MD;  Location: [facility] CATH LAB;  Service: Cardiovascular;  Laterality: N/A;  . NM MYOCAR PERF WALL MOTION  03/15/2009   Normal  . PACEMAKER INSERTION  10/16/2011   Medtronic  . PERMANENT PACEMAKER INSERTION N/A 03/24/2011  Procedure: PERMANENT PACEMAKER INSERTION;  Surgeon: Thurmon Fair, MD;  Location: [facility] CATH LAB;  Service: Cardiovascular;  Laterality: N/A;  . REPLACEMENT TOTAL KNEE      OB History    No data available       Home Medications    Prior to Admission medications   Medication Sig Start Date End Date Taking? Authorizing Provider  amiodarone (PACERONE) 200 MG tablet Take 0.5 tablets (100 mg total) by mouth daily. 07/30/15  Yes Rosalio Macadamia, NP  Coenzyme  Q10 (CO Q 10 PO) Take 1 tablet by mouth daily.   Yes Historical Provider, MD  ELIQUIS 2.5 MG TABS tablet TAKE ONE TABLET BY MOUTH TWICE DAILY. 07/09/15  Yes Mihai Croitoru, MD  furosemide (LASIX) 40 MG tablet Take 40 mg by mouth daily. 3 lbs weight gain in 24 hours extra half tablet ( 20 mg )   Yes Historical Provider, MD  glimepiride (AMARYL) 1 MG tablet Take 0.5 mg by mouth as needed (for blood sugar levels over 120).    Yes Historical Provider, MD  HYDROcodone-acetaminophen (NORCO/VICODIN) 5-325 MG tablet Take 0.5-1 tablets by mouth every 4 (four) hours as needed for severe pain. 09/11/15  Yes Mihai Croitoru, MD  lubiprostone (AMITIZA) 24 MCG capsule Take 24 mcg by mouth 2 (two) times daily with a meal.   Yes Historical Provider, MD  meclizine (ANTIVERT) 25 MG tablet Take 25 mg by mouth 3 (three) times daily as needed for dizziness.   Yes Historical Provider, MD  metolazone (ZAROXOLYN) 2.5 MG tablet Take 2.5 mg by mouth daily. If patients weighs over 140 take 1 tablet (2.5 mg ) with extra half of lasix (20 mg )   Yes Historical Provider, MD  metoprolol succinate (TOPROL-XL) 25 MG 24 hr tablet TAKE 3 TABLETS BY MOUTH DAILY. 09/18/15  Yes Mihai Croitoru, MD  Multiple Vitamins-Minerals (CENTRUM SILVER PO) Take 1 tablet by mouth every morning.    Yes Historical Provider, MD  Multiple Vitamins-Minerals (ICAPS AREDS 2) CAPS Take 1 capsule by mouth 2 (two) times daily.   Yes Historical Provider, MD  nitroGLYCERIN (NITROSTAT) 0.4 MG SL tablet DISSOLVE 1 TABLET UNDER TONGUE EVERY 5 MINUTES UP TO 15 MIN FOR CHESTPAIN. IF NO RELIEF CALL 911. 12/25/14  Yes Mihai Croitoru, MD  omeprazole (PRILOSEC) 20 MG capsule TAKE ONE CAPSULE BY MOUTH DAILY. 06/18/15  Yes Mihai Croitoru, MD  penicillin v potassium (VEETID) 500 MG tablet Take 500 mg by mouth once. Prior to dental procedure   Yes Historical Provider, MD  potassium chloride (KLOR-CON) 8 MEQ tablet Take 1 tablet (8 mEq total) by mouth 3 (three) times daily. 05/08/15   Yes Mihai Croitoru, MD  pravastatin (PRAVACHOL) 40 MG tablet TAKE 1 TABLET BY MOUTH EACH EVENING. 06/06/15  Yes Mihai Croitoru, MD  ondansetron (ZOFRAN) 4 MG tablet Take 4 mg by mouth every 4 (four) hours as needed for nausea or vomiting.    Historical Provider, MD    Family History Family History  Problem Relation Age of Onset  . Suicidality Mother   . Heart murmur Daughter   . Heart attack Brother     Social History Social History  Substance Use Topics  . Smoking status: Never Smoker  . Smokeless tobacco: Never Used  . Alcohol use No     Allergies   Coumadin [warfarin sodium]; Meloxicam; Metformin and related; Morphine; Nabumetone; Oxycodone; and Sulfonamide derivatives   Review of Systems Review of Systems  Musculoskeletal:       Right hip  pain  All other systems reviewed and are negative.    Physical Exam Updated Vital Signs BP 141/61   Pulse 70   Temp 97.5 F (36.4 C) (Oral)   Resp 18   Ht 5\' 5"  (1.651 m)   Wt 137 lb (62.1 kg)   SpO2 92%   BMI 22.80 kg/m   Physical Exam  Constitutional: She is oriented to person, place, and time. She appears well-developed and well-nourished.  HENT:  Head: Normocephalic and atraumatic.  Right Ear: External ear normal.  Left Ear: External ear normal.  Nose: Nose normal.  Mouth/Throat: Oropharynx is clear and moist.  Eyes: Conjunctivae and EOM are normal. Pupils are equal, round, and reactive to light.  Neck: Normal range of motion. Neck supple.  Cardiovascular: Normal rate, regular rhythm, normal heart sounds and intact distal pulses.   Pulmonary/Chest: Effort normal and breath sounds normal.  Abdominal: Soft. Bowel sounds are normal.  Musculoskeletal: Normal range of motion.       Right hip: She exhibits tenderness.  Neurological: She is alert and oriented to person, place, and time.  Skin: Skin is warm and dry.  Psychiatric: She has a normal mood and affect. Her behavior is normal. Judgment and thought content  normal.  Nursing note and vitals reviewed.    ED Treatments / Results  Labs (all labs ordered are listed, but only abnormal results are displayed) Labs Reviewed  CBC WITH DIFFERENTIAL/PLATELET - Abnormal; Notable for the following:       Result Value   WBC 11.8 (*)    RBC 3.59 (*)    Hemoglobin 11.5 (*)    HCT 35.3 (*)    Neutro Abs 10.1 (*)    All other components within normal limits  PROTIME-INR - Abnormal; Notable for the following:    Prothrombin Time 18.8 (*)    All other components within normal limits  COMPREHENSIVE METABOLIC PANEL - Abnormal; Notable for the following:    Sodium 132 (*)    Chloride 96 (*)    Glucose, Bld 180 (*)    BUN 25 (*)    Creatinine, Ser 1.24 (*)    Calcium 8.6 (*)    Albumin 3.4 (*)    GFR calc non Af Amer 38 (*)    GFR calc Af Amer 44 (*)    All other components within normal limits  URINE CULTURE  URINALYSIS, ROUTINE W REFLEX MICROSCOPIC (NOT [facility])  TYPE AND SCREEN    EKG  EKG Interpretation None       Radiology Dg Chest 1 View  Result Date: 11/01/2015 CLINICAL DATA:  82 year old female with history of trauma from a fall with pain in the right hip and right femur. EXAM: CHEST 1 VIEW COMPARISON:  Chest x-ray 05/03/2015. FINDINGS: Diffuse peribronchial cuffing. Upper lobe volume loss as evidenced by upward retraction of the minor fissure and upward retraction of hilar structures bilaterally. Diffuse interstitial prominence, most evident throughout the mid to upper lungs, where there is a reticulonodular pattern which has progressively increased in prominence over several prior examinations. Lower lungs are relatively clear. No pleural effusions. No evidence of pulmonary edema no pneumothorax. Heart size is mildly enlarged. Upper mediastinal contours are within normal limits. Atherosclerosis in the thoracic aorta. Left-sided biventricular pacemaker device in place with lead tips projecting over the expected location of the right  atrium, right ventricle, and overlying the lateral wall the left ventricle via the coronary sinus and coronary veins. Bony thorax appears grossly intact. IMPRESSION: 1.  No definite evidence to suggest significant acute traumatic injury to the thorax. Highly unusual appearance of the lungs, as above. This appearance can be seen in the setting of sarcoidosis. Other differential considerations include sequela of chronic hypersensitivity pneumonitis or sequela of chronic recurrent infections. These findings could be best evaluated with followup nonemergent high-resolution chest CT if clinically appropriate. 2. Mild cardiomegaly. 3. Aortic atherosclerosis. Electronically Signed   By: Trudie Reed M.D.   On: 11/01/2015 17:00   Dg Pelvis 1-2 Views  Result Date: 11/01/2015 CLINICAL DATA:  Fall.  Pain. EXAM: PELVIS - 1-2 VIEW COMPARISON:  03/12/2015. FINDINGS: A fracture along the greater trochanter of the right femur is noted. This is new from prior study of 03/12/2015. Right hip series suggested for further evaluation. Old right inferior pubic ramus fracture. Degenerative changes lumbar spine and both hips. Diffuse osteopenia. IMPRESSION: A fracture is noted along the greater trochanter of the right femur. This is new from prior study 03/12/2015. Right hip series suggested for further evaluation. Electronically Signed   By: Maisie Fus  Register   On: 11/01/2015 16:55   Dg Femur Min 2 Views Right  Result Date: 11/01/2015 CLINICAL DATA:  Status post fall.  Right hip pain. EXAM: RIGHT FEMUR 2 VIEWS COMPARISON:  None. FINDINGS: Minimally displaced right intertrochanteric hip fracture. No other fracture or dislocation. Right knee arthroplasty. Peripheral vascular atherosclerotic disease. IMPRESSION: 1. Minimally displaced acute right intertrochanteric hip fracture. Electronically Signed   By: Elige Ko   On: 11/01/2015 16:53    Procedures Procedures (including critical care time)  Medications Ordered in  ED Medications  0.9 %  sodium chloride infusion ( Intravenous New Bag/Given 11/01/15 1703)  ondansetron (ZOFRAN) injection 4 mg (4 mg Intravenous Given 11/01/15 1601)  fentaNYL (SUBLIMAZE) injection 50 mcg (50 mcg Intravenous Given 11/01/15 1602)  fentaNYL (SUBLIMAZE) injection 50 mcg (50 mcg Intravenous Given 11/01/15 1754)     Initial Impression / Assessment and Plan / ED Course  I have reviewed the triage vital signs and the nursing notes.  Pertinent labs & imaging results that were available during my care of the patient were reviewed by me and considered in my medical decision making (see chart for details).  Clinical Course   Pt spoke with Dr. Romeo Apple (ortho) who will see pt in consult.  He requested that we hold the Eliquis.  Pt d/w Dr. Conley Rolls (triad) for admission.  Final Clinical Impressions(s) / ED Diagnoses   Final diagnoses:  Pre-op chest exam  Closed displaced intertrochanteric fracture of right femur, initial encounter [facility])  Fall, initial encounter    New Prescriptions New Prescriptions   No medications on file     Jacalyn Lefevre, MD 11/01/15 1831

## 2015-11-01 NOTE — ED Triage Notes (Signed)
Pt comes in by ems for a fall. Pt was standing on a stool when she lost her balance. She landed on her right side. She is having pain to her right hip and right upper thigh.

## 2015-11-01 NOTE — H&P (Signed)
History and Physical    Kathleen [name]:[id number] DOB: 22-Jun-1924 DOA: 11/01/2015  PCP: [name]., MD  Patient coming from: Home.  Chief Complaint:   Larey Seat, hurt right hip.   HPI: Kathleen [name] is an 82 y.o. 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  She denied LOC, palpitation, but complained of right hip pain.  Xray confirmed mild displaced right intertroch Fx, EKG showed paced rhythm, CXR showed unusual pattern, but no infiltrate, Cr of 1.2, Hb of 11.5 g per dL, and INR of 1.5.  EDP consulted Dr Romeo Apple, and hospitalist was asked to admit her for further Tx.    ED Course:  See above.  Rewiew of Systems:  Constitutional: Negative for malaise, fever and chills. No significant weight loss or weight gain Eyes: Negative for eye pain, redness and discharge, diplopia, visual changes, or flashes of light. ENMT: Negative for ear pain, hoarseness, nasal congestion, sinus pressure and sore throat. No headaches; tinnitus, drooling, or problem swallowing. Cardiovascular: Negative for chest pain, palpitations, diaphoresis, dyspnea and peripheral edema. ; No orthopnea, PND Respiratory: Negative for cough, hemoptysis, wheezing and stridor. No pleuritic chestpain. Gastrointestinal: Negative for diarrhea, constipation,  melena, blood in stool, hematemesis, jaundice and rectal bleeding.    Genitourinary: Negative for frequency, dysuria, incontinence,flank pain and hematuria; Musculoskeletal: Negative for back pain and neck pain. Negative for swelling and trauma.;  Skin: . Negative for pruritus, rash, abrasions, bruising and skin lesion.; ulcerations Neuro: Negative for headache, lightheadedness and neck stiffness. Negative for weakness, altered level of consciousness , altered mental status, extremity weakness, burning feet, involuntary movement, seizure and  syncope.  Psych: negative for anxiety, depression, insomnia, tearfulness, panic attacks, hallucinations, paranoia, suicidal or homicidal ideation    Past Medical History:  Diagnosis Date  . Acid reflux   . 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 CAD (coronary artery disease)   . Chronic kidney disease (CKD), stage III (moderate)   . Essential hypertension   . History of pneumonia   . Hyperlipidemia   . Nonischemic cardiomyopathy (HCC)   . PAF (paroxysmal atrial fibrillation) (HCC)   . Type 2 diabetes mellitus (HCC)     Rewiew of Systems:  Constitutional: Negative for malaise, fever and chills. No significant weight loss or weight gain Eyes: Negative for eye pain, redness and discharge, diplopia, visual changes, or flashes of light. ENMT: Negative for ear pain, hoarseness, nasal congestion, sinus pressure and sore throat. No headaches; tinnitus, drooling, or problem swallowing. Cardiovascular: Negative for chest pain, palpitations, diaphoresis, dyspnea and peripheral edema. ; No orthopnea, PND Respiratory: Negative for cough, hemoptysis, wheezing and stridor. No pleuritic chestpain. Gastrointestinal: Negative for nausea, vomiting, diarrhea, constipation, abdominal pain, melena, blood in stool, hematemesis, jaundice and rectal bleeding.    Genitourinary: Negative for frequency, dysuria, incontinence,flank pain and hematuria; Musculoskeletal: Negative for back pain and neck pain. Negative for swelling and trauma.;  Skin: . Negative for pruritus, rash, abrasions, bruising and skin lesion.; ulcerations Neuro: Negative for headache, lightheadedness and neck stiffness. Negative for weakness, altered level of consciousness , altered mental status, extremity weakness, burning feet, involuntary movement, seizure and syncope.  Psych: negative for anxiety, depression, insomnia, tearfulness, panic attacks, hallucinations, paranoia, suicidal or homicidal ideation   Past Surgical  History:  Procedure Laterality Date  . Bladder tack  1970's  .  CARDIAC CATHETERIZATION  10/15/2011   Normal coronaries  . CARDIOVERSION N/A 10/28/2013   Procedure: CARDIOVERSION;  Surgeon: Thurmon Fair, MD;  Location: [facility] ENDOSCOPY;  Service: Cardiovascular;  Laterality: N/A;  . CHOLECYSTECTOMY  1999  . LEFT AND RIGHT HEART CATHETERIZATION WITH CORONARY ANGIOGRAM N/A 10/15/2011   Procedure: LEFT AND RIGHT HEART CATHETERIZATION WITH CORONARY ANGIOGRAM;  Surgeon: Chrystie Nose, MD;  Location: [facility] CATH LAB;  Service: Cardiovascular;  Laterality: N/A;  . NM MYOCAR PERF WALL MOTION  03/15/2009   Normal  . PACEMAKER INSERTION  10/16/2011   Medtronic  . PERMANENT PACEMAKER INSERTION N/A 03/24/2011   Procedure: PERMANENT PACEMAKER INSERTION;  Surgeon: Thurmon Fair, MD;  Location: [facility] CATH LAB;  Service: Cardiovascular;  Laterality: N/A;  . REPLACEMENT TOTAL KNEE       reports that she has never smoked. She has never used smokeless tobacco. She reports that she does not drink alcohol or use drugs.  Allergies  Allergen Reactions  . Coumadin [Warfarin Sodium] Other (See Comments)    Caused Patient to Bleed Out.   . Meloxicam Other (See Comments)    Unknown  . Metformin And Related Other (See Comments)    Cannot have due to kidney function  . Morphine Itching    Not in right state of mind.   . Nabumetone Nausea And Vomiting  . Oxycodone Nausea And Vomiting  . Sulfonamide Derivatives Hives    Family History  Problem Relation Age of Onset  . Suicidality Mother   . Heart murmur Daughter   . Heart attack Brother      Prior to Admission medications   Medication Sig Start Date End Date Taking? Authorizing Provider  amiodarone (PACERONE) 200 MG tablet Take 0.5 tablets (100 mg total) by mouth daily. 07/30/15  Yes Rosalio Macadamia, NP  Coenzyme Q10 (CO Q 10 PO) Take 1 tablet by mouth daily.   Yes Historical Provider, MD  ELIQUIS 2.5 MG TABS tablet TAKE ONE TABLET BY MOUTH TWICE DAILY. 07/09/15   Yes Mihai Croitoru, MD  furosemide (LASIX) 40 MG tablet Take 40 mg by mouth daily. 3 lbs weight gain in 24 hours extra half tablet ( 20 mg )   Yes Historical Provider, MD  glimepiride (AMARYL) 1 MG tablet Take 0.5 mg by mouth as needed (for blood sugar levels over 120).    Yes Historical Provider, MD  HYDROcodone-acetaminophen (NORCO/VICODIN) 5-325 MG tablet Take 0.5-1 tablets by mouth every 4 (four) hours as needed for severe pain. 09/11/15  Yes Mihai Croitoru, MD  lubiprostone (AMITIZA) 24 MCG capsule Take 24 mcg by mouth 2 (two) times daily with a meal.   Yes Historical Provider, MD  meclizine (ANTIVERT) 25 MG tablet Take 25 mg by mouth 3 (three) times daily as needed for dizziness.   Yes Historical Provider, MD  metolazone (ZAROXOLYN) 2.5 MG tablet Take 2.5 mg by mouth daily. If patients weighs over 140 take 1 tablet (2.5 mg ) with extra half of lasix (20 mg )   Yes Historical Provider, MD  metoprolol succinate (TOPROL-XL) 25 MG 24 hr tablet TAKE 3 TABLETS BY MOUTH DAILY. 09/18/15  Yes Mihai Croitoru, MD  Multiple Vitamins-Minerals (CENTRUM SILVER PO) Take 1 tablet by mouth every morning.    Yes Historical Provider, MD  Multiple Vitamins-Minerals (ICAPS AREDS 2) CAPS Take 1 capsule by mouth 2 (two) times daily.   Yes Historical Provider, MD  nitroGLYCERIN (NITROSTAT) 0.4 MG SL tablet DISSOLVE 1 TABLET UNDER TONGUE EVERY 5 MINUTES UP TO 15  MIN FOR CHESTPAIN. IF NO RELIEF CALL 911. 12/25/14  Yes Mihai Croitoru, MD  omeprazole (PRILOSEC) 20 MG capsule TAKE ONE CAPSULE BY MOUTH DAILY. 06/18/15  Yes Mihai Croitoru, MD  penicillin v potassium (VEETID) 500 MG tablet Take 500 mg by mouth once. Prior to dental procedure   Yes Historical Provider, MD  potassium chloride (KLOR-CON) 8 MEQ tablet Take 1 tablet (8 mEq total) by mouth 3 (three) times daily. 05/08/15  Yes Mihai Croitoru, MD  pravastatin (PRAVACHOL) 40 MG tablet TAKE 1 TABLET BY MOUTH EACH EVENING. 06/06/15  Yes Mihai Croitoru, MD  ondansetron (ZOFRAN) 4  MG tablet Take 4 mg by mouth every 4 (four) hours as needed for nausea or vomiting.    Historical Provider, MD    Physical Exam: Vitals:   11/01/15 1800 11/01/15 1830 11/01/15 1900 11/01/15 1930  BP: 141/61 136/69 138/57 146/59  Pulse: 70 70 70 70  Resp: 18 18 17 21   Temp:      TempSrc:      SpO2: 92% 94% 93% 95%  Weight:      Height:          Constitutional: NAD, calm, comfortable Vitals:   11/01/15 1800 11/01/15 1830 11/01/15 1900 11/01/15 1930  BP: 141/61 136/69 138/57 146/59  Pulse: 70 70 70 70  Resp: 18 18 17 21   Temp:      TempSrc:      SpO2: 92% 94% 93% 95%  Weight:      Height:       Eyes: PERRL, lids and conjunctivae normal ENMT: Mucous membranes are moist. Posterior pharynx clear of any exudate or lesions.Normal dentition.  Neck: normal, supple, no masses, no thyromegaly Respiratory: clear to auscultation bilaterally, no wheezing, no crackles. Normal respiratory effort. No accessory muscle use.  Cardiovascular: Regular rate and rhythm, no murmurs / rubs / gallops. No extremity edema. 2+ pedal pulses. No carotid bruits.  Abdomen: no tenderness, no masses palpated. No hepatosplenomegaly. Bowel sounds positive.  Musculoskeletal: no clubbing / cyanosis. No joint deformity upper and lower extremities. Good ROM, no contractures. Normal muscle tone. She has external rotation and shortened right leg.  Skin: no rashes, lesions, ulcers. No induration. Ppm on left upper chest.  Neurologic: CN 2-12 grossly intact. Sensation intact, DTR normal. Strength 5/5 in all 4.  Psychiatric: Normal judgment and insight. Alert and oriented x 3. Normal mood.   Labs on Admission: I have personally reviewed following labs and imaging studies  CBC:  Recent Labs Lab 11/01/15 1658  WBC 11.8*  NEUTROABS 10.1*  HGB 11.5*  HCT 35.3*  MCV 98.3  PLT 263   Basic Metabolic Panel:  Recent Labs Lab 11/01/15 1658  NA 132*  K 4.1  CL 96*  CO2 28  GLUCOSE 180*  BUN 25*  CREATININE  1.24*  CALCIUM 8.6*   GFR: Estimated Creatinine Clearance: 28.2 mL/min (by C-G formula based on SCr of 1.24 mg/dL (H)). Liver Function Tests:  Recent Labs Lab 11/01/15 1658  AST 24  ALT 22  ALKPHOS 120  BILITOT 0.8  PROT 6.7  ALBUMIN 3.4*   Coagulation Profile:  Recent Labs Lab 11/01/15 1658  INR 1.56   Urine analysis:    Component Value Date/Time   COLORURINE YELLOW 11/01/2015 1728   APPEARANCEUR CLEAR 11/01/2015 1728   LABSPEC 1.015 11/01/2015 1728   PHURINE 6.0 11/01/2015 1728   GLUCOSEU NEGATIVE 11/01/2015 1728   HGBUR NEGATIVE 11/01/2015 1728   BILIRUBINUR NEGATIVE 11/01/2015 1728   KETONESUR NEGATIVE 11/01/2015 1728  PROTEINUR NEGATIVE 11/01/2015 1728   UROBILINOGEN 0.2 10/16/2013 0910   NITRITE NEGATIVE 11/01/2015 1728   LEUKOCYTESUR NEGATIVE 11/01/2015 1728    Radiological Exams on Admission: Dg Chest 1 View  Result Date: 11/01/2015 CLINICAL DATA:  82 year old female with history of trauma from a fall with pain in the right hip and right femur. EXAM: CHEST 1 VIEW COMPARISON:  Chest x-ray 05/03/2015. FINDINGS: Diffuse peribronchial cuffing. Upper lobe volume loss as evidenced by upward retraction of the minor fissure and upward retraction of hilar structures bilaterally. Diffuse interstitial prominence, most evident throughout the mid to upper lungs, where there is a reticulonodular pattern which has progressively increased in prominence over several prior examinations. Lower lungs are relatively clear. No pleural effusions. No evidence of pulmonary edema no pneumothorax. Heart size is mildly enlarged. Upper mediastinal contours are within normal limits. Atherosclerosis in the thoracic aorta. Left-sided biventricular pacemaker device in place with lead tips projecting over the expected location of the right atrium, right ventricle, and overlying the lateral wall the left ventricle via the coronary sinus and coronary veins. Bony thorax appears grossly intact.  IMPRESSION: 1. No definite evidence to suggest significant acute traumatic injury to the thorax. Highly unusual appearance of the lungs, as above. This appearance can be seen in the setting of sarcoidosis. Other differential considerations include sequela of chronic hypersensitivity pneumonitis or sequela of chronic recurrent infections. These findings could be best evaluated with followup nonemergent high-resolution chest CT if clinically appropriate. 2. Mild cardiomegaly. 3. Aortic atherosclerosis. Electronically Signed   By: Trudie Reed M.D.   On: 11/01/2015 17:00   Dg Pelvis 1-2 Views  Result Date: 11/01/2015 CLINICAL DATA:  Fall.  Pain. EXAM: PELVIS - 1-2 VIEW COMPARISON:  03/12/2015. FINDINGS: A fracture along the greater trochanter of the right femur is noted. This is new from prior study of 03/12/2015. Right hip series suggested for further evaluation. Old right inferior pubic ramus fracture. Degenerative changes lumbar spine and both hips. Diffuse osteopenia. IMPRESSION: A fracture is noted along the greater trochanter of the right femur. This is new from prior study 03/12/2015. Right hip series suggested for further evaluation. Electronically Signed   By: Maisie Fus  Register   On: 11/01/2015 16:55   Dg Femur Min 2 Views Right  Result Date: 11/01/2015 CLINICAL DATA:  Status post fall.  Right hip pain. EXAM: RIGHT FEMUR 2 VIEWS COMPARISON:  None. FINDINGS: Minimally displaced right intertrochanteric hip fracture. No other fracture or dislocation. Right knee arthroplasty. Peripheral vascular atherosclerotic disease. IMPRESSION: 1. Minimally displaced acute right intertrochanteric hip fracture. Electronically Signed   By: Elige Ko   On: 11/01/2015 16:53    EKG: Independently reviewed. Paced rhythm.   Assessment/Plan Principal Problem:   Hip fracture (HCC) Active Problems:   Hypertension   DM type 2 (diabetes mellitus, type 2) (HCC)   Hyperlipidemia   Coronary atherosclerosis, no  significant obstructive lesions    PLAN:   Right hip Fx:   Will await OTIF.  Dr Romeo Apple is aware of admission and will see her.  I will start her on diet, and stop Eliquis.  Please make her NPO when we have a definite OR time. Will give Fentanyl as she is allergic to morphine and codeine family.   DM:  Will hold her meds.  Give D5 but only at 50 cc per hour.  Diastolic CHF, Non ischemic CMP:  Hold Lasix, give IVF at ONLY 50cc per hour.  HLD:  Continue with Statin, continue with Alpha Omega as  in [place].   HTN:  Stable.  Continue meds.    DVT prophylaxis: Hold Eliquis, Use SCD.   Code Status: DNR:  I have discussed this with patient in the presence of her daughter.   Family Communication: Daughter.   Disposition Plan: Likely STR after OTIF.  Consults called: Dr Romeo AppleHarrison.  Admission status: Inpatient.    Isham Smitherman MD FACP. [facility]  If 7PM-7AM, please contact night-coverage [email or web address] Password [email or web address]  11/01/2015, 7:42 PM

## 2015-11-01 NOTE — Plan of Care (Signed)
Problem: Pain Managment: Goal: General experience of comfort will improve Outcome: Progressing Pt receives Fentanyl for pain management.  Problem: Tissue Perfusion: Goal: Risk factors for ineffective tissue perfusion will decrease Outcome: Progressing Pt wearing SCDs  Problem: Activity: Goal: Risk for activity intolerance will decrease Outcome: Progressing Rest periods provided between activity.  Problem: Nutrition: Goal: Adequate nutrition will be maintained Outcome: Not Progressing Pt states that she has lost 30+ lbs within the last few months. She states her appetite has been poor.

## 2015-11-02 DIAGNOSIS — E440 Moderate protein-calorie malnutrition: Secondary | ICD-10-CM | POA: Insufficient documentation

## 2015-11-02 DIAGNOSIS — I5022 Chronic systolic (congestive) heart failure: Secondary | ICD-10-CM

## 2015-11-02 DIAGNOSIS — I429 Cardiomyopathy, unspecified: Secondary | ICD-10-CM

## 2015-11-02 DIAGNOSIS — I442 Atrioventricular block, complete: Secondary | ICD-10-CM

## 2015-11-02 DIAGNOSIS — I4891 Unspecified atrial fibrillation: Secondary | ICD-10-CM

## 2015-11-02 DIAGNOSIS — S72141A Displaced intertrochanteric fracture of right femur, initial encounter for closed fracture: Principal | ICD-10-CM

## 2015-11-02 DIAGNOSIS — Z01818 Encounter for other preprocedural examination: Secondary | ICD-10-CM

## 2015-11-02 DIAGNOSIS — S72001A Fracture of unspecified part of neck of right femur, initial encounter for closed fracture: Secondary | ICD-10-CM

## 2015-11-02 DIAGNOSIS — Z950 Presence of cardiac pacemaker: Secondary | ICD-10-CM

## 2015-11-02 LAB — PROTIME-INR
INR: 1.59
Prothrombin Time: 19.1 seconds — ABNORMAL HIGH (ref 11.4–15.2)

## 2015-11-02 LAB — CBC
HEMATOCRIT: 31.4 % — AB (ref 36.0–46.0)
HEMOGLOBIN: 10.3 g/dL — AB (ref 12.0–15.0)
MCH: 32.3 pg (ref 26.0–34.0)
MCHC: 32.8 g/dL (ref 30.0–36.0)
MCV: 98.4 fL (ref 78.0–100.0)
PLATELETS: 231 10*3/uL (ref 150–400)
RBC: 3.19 MIL/uL — AB (ref 3.87–5.11)
RDW: 14.8 % (ref 11.5–15.5)
WBC: 9.5 10*3/uL (ref 4.0–10.5)

## 2015-11-02 LAB — GLUCOSE, CAPILLARY
GLUCOSE-CAPILLARY: 238 mg/dL — AB (ref 65–99)
Glucose-Capillary: 225 mg/dL — ABNORMAL HIGH (ref 65–99)
Glucose-Capillary: 192 mg/dL — ABNORMAL HIGH (ref 65–99)
Glucose-Capillary: 236 mg/dL — ABNORMAL HIGH (ref 65–99)

## 2015-11-02 LAB — BASIC METABOLIC PANEL
ANION GAP: 7 (ref 5–15)
BUN: 25 mg/dL — ABNORMAL HIGH (ref 6–20)
CHLORIDE: 99 mmol/L — AB (ref 101–111)
CO2: 27 mmol/L (ref 22–32)
CREATININE: 1.29 mg/dL — AB (ref 0.44–1.00)
Calcium: 8.3 mg/dL — ABNORMAL LOW (ref 8.9–10.3)
GFR calc non Af Amer: 36 mL/min — ABNORMAL LOW (ref >60)
GFR, EST AFRICAN AMERICAN: 42 mL/min — AB (ref >60)
Glucose, Bld: 214 mg/dL — ABNORMAL HIGH (ref 65–99)
POTASSIUM: 4.4 mmol/L (ref 3.5–5.1)
SODIUM: 133 mmol/L — AB (ref 135–145)

## 2015-11-02 LAB — SURGICAL PCR SCREEN
MRSA, PCR: NEGATIVE
Staphylococcus aureus: POSITIVE — AB

## 2015-11-02 LAB — PREPARE RBC (CROSSMATCH)

## 2015-11-02 LAB — APTT
aPTT: 39 s — ABNORMAL HIGH (ref 24–36)
aPTT: 104 s — ABNORMAL HIGH (ref 24–36)

## 2015-11-02 LAB — HEPARIN LEVEL (UNFRACTIONATED)
HEPARIN UNFRACTIONATED: 1.69 [IU]/mL — AB (ref 0.30–0.70)
Heparin Unfractionated: 3.22 IU/mL — ABNORMAL HIGH (ref 0.30–0.70)

## 2015-11-02 MED ORDER — HEPARIN BOLUS VIA INFUSION
3500.0000 [IU] | Freq: Once | INTRAVENOUS | Status: AC
  Administered 2015-11-02: 3500 [IU] via INTRAVENOUS
  Filled 2015-11-02: qty 3500

## 2015-11-02 MED ORDER — DEXTROSE-NACL 5-0.2 % IV SOLN: INTRAVENOUS | Status: DC

## 2015-11-02 MED ORDER — FUROSEMIDE 40 MG PO TABS
40.0000 mg | ORAL_TABLET | Freq: Every day | ORAL | Status: DC
  Administered 2015-11-02 – 2015-11-04 (×2): 40 mg via ORAL
  Filled 2015-11-02 (×2): qty 1

## 2015-11-02 MED ORDER — METOPROLOL TARTRATE 5 MG/5ML IV SOLN: 5.0000 mg | INTRAVENOUS | Status: DC | PRN

## 2015-11-02 MED ORDER — SODIUM CHLORIDE 0.9 % IV SOLN
Freq: Once | INTRAVENOUS | Status: DC
Start: 2015-11-02 — End: 2015-11-02

## 2015-11-02 MED ORDER — INSULIN ASPART 100 UNIT/ML ~~LOC~~ SOLN
0.0000 [IU] | Freq: Every day | SUBCUTANEOUS | Status: DC
  Administered 2015-11-02 – 2015-11-03 (×3): 2 [IU] via SUBCUTANEOUS
  Administered 2015-11-04 – 2015-11-05 (×2): 3 [IU] via SUBCUTANEOUS

## 2015-11-02 MED ORDER — INSULIN ASPART 100 UNIT/ML ~~LOC~~ SOLN
0.0000 [IU] | Freq: Three times a day (TID) | SUBCUTANEOUS | Status: DC
  Administered 2015-11-02: 2 [IU] via SUBCUTANEOUS
  Administered 2015-11-02: 3 [IU] via SUBCUTANEOUS
  Administered 2015-11-03 – 2015-11-04 (×2): 2 [IU] via SUBCUTANEOUS
  Administered 2015-11-05 (×2): 3 [IU] via SUBCUTANEOUS
  Administered 2015-11-05: 7 [IU] via SUBCUTANEOUS
  Administered 2015-11-06: 2 [IU] via SUBCUTANEOUS

## 2015-11-02 MED ORDER — HEPARIN (PORCINE) IN NACL 100-0.45 UNIT/ML-% IJ SOLN
800.0000 [IU]/h | INTRAMUSCULAR | Status: AC
  Administered 2015-11-02: 900 [IU]/h via INTRAVENOUS
  Filled 2015-11-02: qty 250

## 2015-11-02 MED ORDER — DEXTROSE 5 % IV SOLN
INTRAVENOUS | Status: AC
  Administered 2015-11-02: 12:00:00 via INTRAVENOUS

## 2015-11-02 NOTE — Consult Note (Signed)
CARDIOLOGY CONSULT NOTE   Patient ID: Kathleen [name] MRN: [id number] DOB/AGE: 80/10/1924 80 y.o.  Admit Date: 11/01/2015 Referring Physician: Fuller Canada MD Primary Physician: [name]., MD Consulting Cardiologist: Prentice Docker  Primary Cardiologist: Croitoru MD  Reason for Consultation: Pre-Operative Evaluation   Clinical Summary Ms. Alejandre is a 83 y.o.female with known history of long-standing nonischemic cardiomyopathy, biventricular pacemaker in situ, paroxysmal atrial fibrillation on amiodarone and ELIQUIS therapy, CHADS VASC Score of 7 (Age, Female, Hypertension, CAD, LV dysfunction, Diabetes),  Hyperlipidemia.   The patient was admitted after sustaining a right intertrochanteric hip fracture after falling at home. She is normally very active, and was standing on a stool cleaning a ceiling fan when the fall occurred.. She denies dizziness, near syncope, she simply lost her footing on the small stool falling onto the floor.  Of note, in the Spring of this year she broke her pelvis while dancing. She states she does not wish to be a sedentary person she says she gets bored sitting around. She likes to remain active. We are asked for preoperative cardiac evaluation. She is planned for surgery in am. She has been taken off of Eliquis in anticipation of surgery.  She is normally seen by Dr. Royann Shivers in [place], and was last seen on 09/10/2015. Pacemaker  interrogation was completed at that time revealing 100% biventricular pacing, 100% atrial pacing. She had not had any recent atrial fibrillation or ventricular high rates. At that time she appeared euvolemic and stable. Dry weight was 140 pounds per office note.  On arrival to the emergency room the patient's blood pressure is 155/76, heart rate 80, O2 sat 98%, she was afebrile. Lab results reveal elevated white blood cells 11.8, hemoglobin of 11.5 and hematocrit 35.3. Platelets 263. PT 18.8 INR 1.56. Sodium  132, potassium 4.1, chloride 96, BUN 25, creatinine 1.24. EKG reveals atrioventricular pacing rate of 70 bpm. Chest x-ray was abnormal, suggesting unusual appearance of the lungs with diffuse interstitial prominence most evidence throughout the mid to upper lungs with a respiratory rate particular nodular pattern which is progressively increased in prominence over several prior examinations. Differential considerations include sequela of chronic hypersensitivity pneumonitis or sequela of chronic recurrent infections.   Allergies  Allergen Reactions  . Coumadin [Warfarin Sodium] Other (See Comments)    Caused Patient to Bleed Out.   . Meloxicam Other (See Comments)    Unknown  . Metformin And Related Other (See Comments)    Cannot have due to kidney function  . Morphine Itching    Not in right state of mind.   . Nabumetone Nausea And Vomiting  . Oxycodone Nausea And Vomiting  . Sulfonamide Derivatives Hives    Medications Scheduled Medications: . amiodarone  100 mg Oral Daily  . feeding supplement (ENSURE ENLIVE)  237 mL Oral BID BM  . Influenza vac split quadrivalent PF  0.5 mL Intramuscular Tomorrow-1000  . metoprolol succinate  75 mg Oral Daily  . pantoprazole  40 mg Oral Daily  . pravastatin  40 mg Oral q1800     Infusions: . [START ON 11/03/2015] dextrose 5 % and 0.2 % NaCl       PRN Medications:  acetaminophen **OR** acetaminophen, fentaNYL (SUBLIMAZE) injection, fentaNYL (SUBLIMAZE) injection   Past Medical History:  Diagnosis Date  . Acid reflux   . 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 CAD (coronary artery disease)   . Chronic kidney disease (CKD), stage III (moderate)   .  Essential hypertension   . History of pneumonia   . Hyperlipidemia   . Nonischemic cardiomyopathy (HCC)   . PAF (paroxysmal atrial fibrillation) (HCC)   . Type 2 diabetes mellitus (HCC)     Past Surgical History:  Procedure Laterality Date  . Bladder tack  1970's    . CARDIAC CATHETERIZATION  10/15/2011   Normal coronaries  . CARDIOVERSION N/A 10/28/2013   Procedure: CARDIOVERSION;  Surgeon: Thurmon FairMihai Croitoru, MD;  Location: [facility];  Service: Cardiovascular;  Laterality: N/A;  . CHOLECYSTECTOMY  1999  . LEFT AND RIGHT HEART CATHETERIZATION WITH CORONARY ANGIOGRAM N/A 10/15/2011   Procedure: LEFT AND RIGHT HEART CATHETERIZATION WITH CORONARY ANGIOGRAM;  Surgeon: Chrystie NoseKenneth C. [name], MD;  [facility];  Service: Cardiovascular;  Laterality: N/A;  . NM MYOCAR PERF WALL MOTION  03/15/2009   Normal  . PACEMAKER INSERTION  10/16/2011   Medtronic  . PERMANENT PACEMAKER INSERTION N/A 03/24/2011   Procedure: PERMANENT PACEMAKER INSERTION;  Surgeon: Thurmon FairMihai Croitoru, MD;  Location: [facility] CATH LAB;  Service: Cardiovascular;  Laterality: N/A;  . REPLACEMENT TOTAL KNEE      Family History  Problem Relation Age of Onset  . Suicidality Mother   . Heart murmur Daughter   . Heart attack Brother      Social History Ms. Cadotte reports that she has never smoked. She has never used smokeless tobacco. [name] reports that she does not drink alcohol.  Review of Systems Complete review of systems are found to be negative unless outlined in H&P above.  Physical Examination Blood pressure 123/60, pulse 72, temperature 98.8 F (37.1 C), temperature source Oral, resp. rate 17, height 5\' 5"  (1.651 m), weight 138 lb 1.6 oz (62.6 kg), SpO2 93 %.  Intake/Output Summary (Last 24 hours) at 11/02/15 0804 Last data filed at 11/02/15 0559  Gross per 24 hour  Intake                0 ml  Output              450 ml  Net             -450 ml    Telemetry:AV pacing  GEN: Frail, but no acute distress HEENT: Conjunctiva and lids normal, oropharynx clear with moist mucosa. Neck: Supple, no elevated JVP or carotid bruits, no thyromegaly. Lungs: Inspiratory wheezing with crackles noted in the upper lobes, occasional nonproductive coughing Cardiac: Regular rate and  rhythm, no S3 or significant systolic murmur, no pericardial rub. Abdomen: Soft, nontender, no hepatomegaly, bowel sounds present, no guarding or rebound. Extremities: No pitting edema, distal pulses 2+. Our rotation of her right leg. Skin: Warm and dry. Musculoskeletal: No kyphosis. Neuropsychiatric: Alert and oriented x3, affect grossly appropriate.  Prior Cardiac Testing/Procedures 1.Echocardiogram 05/01/2015 Left ventricle: The cavity size was normal. Wall thickness was   increased in a pattern of mild LVH. Systolic function was mildly   to moderately reduced. The estimated ejection fraction was in the   range of 40% to 45%. There is akinesis of the inferolateral and   inferior myocardium. Doppler parameters are consistent with   restrictive physiology, indicative of decreased left ventricular   diastolic compliance and/or increased left atrial pressure. - Aortic valve: Trileaflet; mildly calcified leaflets. - Mitral valve: Calcified annulus. There was moderate   regurgitation. - Left atrium: The atrium was severely dilated. - Right ventricle: Pacer wire or catheter noted in right ventricle. - Right atrium: The atrium was mildly dilated. - Tricuspid valve:  There was mild regurgitation. - Pulmonary arteries: PA peak pressure: 49 mm Hg (S). - Pericardium, extracardiac: There was no pericardial effusion.  Impressions:  - Mild LVH with LVEF approximately 40-45%. There is akinesis of the   inferolateral wall. Restrictive diastolic filling pattern with   increased LV filling pressure. Severe left atrial enlargement.   MAC with moderate mitral regurgitation. Mildly sclerotic aortic   valve. Device wire noted within the right heart. Mild tricuspid   regurgitation with PASP 49 mmHg.  NM Stress test: 03/2009 Normal perfusion (see scanned document).  Carotid Artery Ultrasound 05/15/2011 IMPRESSION: Mild amount of calcified atherosclerotic plaque at both carotid bifurcations.   Estimated bilateral ICA stenoses are less than 50% by ultrasound.  Lab Results  Basic Metabolic Panel:  Recent Labs Lab 11/01/15 1658 11/02/15 0557  NA 132* 133*  K 4.1 4.4  CL 96* 99*  CO2 28 27  GLUCOSE 180* 214*  BUN 25* 25*  CREATININE 1.24* 1.29*  CALCIUM 8.6* 8.3*    Liver Function Tests:  Recent Labs Lab 11/01/15 1658  AST 24  ALT 22  ALKPHOS 120  BILITOT 0.8  PROT 6.7  ALBUMIN 3.4*    CBC:  Recent Labs Lab 11/01/15 1658 11/02/15 0557  WBC 11.8* 9.5  NEUTROABS 10.1*  --   HGB 11.5* 10.3*  HCT 35.3* 31.4*  MCV 98.3 98.4  PLT 263 231    Radiology: Dg Chest 1 View  Result Date: 11/01/2015 CLINICAL DATA:  82 year old female with history of trauma from a fall with pain in the right hip and right femur. EXAM: CHEST 1 VIEW COMPARISON:  Chest x-ray 05/03/2015. FINDINGS: Diffuse peribronchial cuffing. Upper lobe volume loss as evidenced by upward retraction of the minor fissure and upward retraction of hilar structures bilaterally. Diffuse interstitial prominence, most evident throughout the mid to upper lungs, where there is a reticulonodular pattern which has progressively increased in prominence over several prior examinations. Lower lungs are relatively clear. No pleural effusions. No evidence of pulmonary edema no pneumothorax. Heart size is mildly enlarged. Upper mediastinal contours are within normal limits. Atherosclerosis in the thoracic aorta. Left-sided biventricular pacemaker device in place with lead tips projecting over the expected location of the right atrium, right ventricle, and overlying the lateral wall the left ventricle via the coronary sinus and coronary veins. Bony thorax appears grossly intact. IMPRESSION: 1. No definite evidence to suggest significant acute traumatic injury to the thorax. Highly unusual appearance of the lungs, as above. This appearance can be seen in the setting of sarcoidosis. Other differential considerations include  sequela of chronic hypersensitivity pneumonitis or sequela of chronic recurrent infections. These findings could be best evaluated with followup nonemergent high-resolution chest CT if clinically appropriate. 2. Mild cardiomegaly. 3. Aortic atherosclerosis. Electronically Signed   By: Trudie Reed M.D.   On: 11/01/2015 17:00   Dg Pelvis 1-2 Views  Result Date: 11/01/2015 CLINICAL DATA:  Fall.  Pain. EXAM: PELVIS - 1-2 VIEW COMPARISON:  03/12/2015. FINDINGS: A fracture along the greater trochanter of the right femur is noted. This is new from prior study of 03/12/2015. Right hip series suggested for further evaluation. Old right inferior pubic ramus fracture. Degenerative changes lumbar spine and both hips. Diffuse osteopenia. IMPRESSION: A fracture is noted along the greater trochanter of the right femur. This is new from prior study 03/12/2015. Right hip series suggested for further evaluation. Electronically Signed   By: Maisie Fus  Register   On: 11/01/2015 16:55   Ct Hip Right Wo Contrast  Result Date: 11/02/2015 CLINICAL DATA:  82 year old female right hip fracture. EXAM: CT OF THE RIGHT HIP WITHOUT CONTRAST TECHNIQUE: Multidetector CT imaging of the right hip was performed according to the standard protocol. Multiplanar CT image reconstructions were also generated. COMPARISON:  Radiograph dated 11/01/2015 FINDINGS: Bones/Joint/Cartilage There is a comminuted intratrochanteric fracture of the right femur with mild proximal migration and impaction of the femoral shaft and the femoral neck. There is associated mild varus angulation. Evaluation of the fracture is however limited due to advanced osteopenia. There is mild posterior displacement of the fracture fragment of the greater trochanter. There is no dislocation. Ligaments Suboptimally assessed by CT. Muscles and Tendons Mild edema of the right thigh musculature. No fluid collection or hematoma. Soft tissues No fluid collection or hematoma. A Foley  catheter is noted within the bladder. There is scattered colonic diverticula. IMPRESSION: Comminuted intertrochanteric fracture of the right femur. Advanced osteopenia. Electronically Signed   By: Elgie Collard M.D.   On: 11/02/2015 03:31   Dg Femur Min 2 Views Right  Result Date: 11/01/2015 CLINICAL DATA:  Status post fall.  Right hip pain. EXAM: RIGHT FEMUR 2 VIEWS COMPARISON:  None. FINDINGS: Minimally displaced right intertrochanteric hip fracture. No other fracture or dislocation. Right knee arthroplasty. Peripheral vascular atherosclerotic disease. IMPRESSION: 1. Minimally displaced acute right intertrochanteric hip fracture. Electronically Signed   By: Elige Ko   On: 11/01/2015 16:53     ECG: AV pacing. Rate of 70 bpm   Impression and Recommendations  1. Acute intertochanteric right hip fracture: She is scheduled for surgical repair in am. She has been taken off of Eliquis. Heparin coverage until surgery is being implemented. Pharmacy is consulted. She is of moderate but acceptable risk to undergo right hip repair. This case will be reviewed and official opinion will be noted by Dr. Purvis Sheffield Please see his note below.   2. NICM: Most recent EF per echo in April of 2017 demonstrated EF of 40%-45%. She is currently not on diuretics on this admission but is on 40 mg daily at home.  Recommend restarting po lasix as taken at home. Lung sounds are congested but no evidence of CHF or pneumonia on CXR. Consider lasix 40 mg  peri-operatively to prevent decompensation with hydration during surgery. Continue BB therapy.   3. Atrial fib: Rate is controlled on amiodarone, metoprolol. Eliquis has been placed on hold for pending surgery. She has high CHADS VASC score of 7. Agree with heparin coverage until am.   4. BIV Pacemaker in situ: No plans to interrogate this admission as she has had this completed on 09/11/2015.  Signed: Bettey Mare. Lawrence NP [facility]  11/02/2015, 8:04 AM Co-Sign  MD  The patient was seen and examined, and I agree with the history, physical exam, assessment and plan as documented above, with modifications as noted below. Patient is at moderate to high risk for major adverse cardiac event in the perioperative period given her age and multiple comorbidites. Agree with starting oral Lasix as per her normal regimen so as to avoid acute systolic decompensated heart failure. She is pacemaker dependent, and it is functioning normally. Device company representative can be contacted over the weekend to assist if needed. Eliquis on hold, continue heparin to avoid thromboembolic event. Continue metoprolol and amiodarone.  Prentice Docker, MD, [facility]  11/02/2015 11:17 AM

## 2015-11-02 NOTE — Clinical Social Work Placement (Signed)
   CLINICAL SOCIAL WORK PLACEMENT  NOTE  Date:  11/02/2015  Patient Details  Name: Kathleen [name] MRN: [id number] Date of Birth: Jun 09, 1924  Clinical Social Work is seeking post-discharge placement for this patient at the Skilled  Nursing Facility level of care (*CSW will initial, date and re-position this form in  chart as items are completed):  Yes   Patient/family provided with [place] Clinical Social Work Department's list of facilities offering this level of care within the geographic area requested by the patient (or if unable, by the patient's family).  Yes   Patient/family informed of their freedom to choose among providers that offer the needed level of care, that participate in Medicare, Medicaid or managed care program needed by the patient, have an available bed and are willing to accept the patient.  Yes   Patient/family informed of [facility]'s ownership interest in [facility] and [facility], as well as of the fact that they are under no obligation to receive care at these facilities.  PASRR submitted to EDS on 11/02/15     PASRR number received on 11/02/15     Existing PASRR number confirmed on       FL2 transmitted to all facilities in geographic area requested by pt/family on 11/02/15     FL2 transmitted to all facilities within larger geographic area on       Patient informed that his/her managed care company has contracts with or will negotiate with certain facilities, including the following:            Patient/family informed of bed offers received.  Patient chooses bed at       Physician recommends and patient chooses bed at      Patient to be transferred to   on  .  Patient to be transferred to facility by       Patient family notified on   of transfer.  Name of family member notified:        PHYSICIAN       Additional Comment:    _______________________________________________ Karn Cassis, LCSW 11/02/2015,  11:17 AM [phone]

## 2015-11-02 NOTE — Care Management Important Message (Signed)
Important Message  Patient Details  Name: SHANAIA DROZDA MRN: [id number] Date of Birth: 1924-11-28   Medicare Important Message Given:  Yes    Kurtis Anastasia, Chrystine Oiler, RN 11/02/2015, 1:05 PM

## 2015-11-02 NOTE — Clinical Social Work Note (Signed)
Clinical Social Work Assessment  Patient Details  Name: Kathleen [name] MRN: [id number] Date of Birth: 12/20/1924  Date of referral:  11/02/15               Reason for consult:  Discharge Planning                Permission sought to share information with:  Family Supports Permission granted to share information::  Yes, Verbal Permission Granted  Name::        Agency::     Relationship::  granddaughter in room   Contact Information:     Housing/Transportation Living arrangements for the past 2 months:  Single Family Home Source of Information:  Patient Patient Interpreter Needed:  None Criminal Activity/Legal Involvement Pertinent to Current Situation/Hospitalization:  No - Comment as needed Significant Relationships:  Adult Children, Other Family Members Lives with:  Adult Children Do you feel safe going back to the place where you live?   (needs rehab first) Need for family participation in patient care:  No (Coment)  Care giving concerns:  Pt will likely require SNF after hip fracture.    Social Worker assessment / plan: CSW met with pt and pt's granddaughter at bedside. Pt alert and oriented and states she fell at home, standing on a stool cleaning the fan. Pt admitted due to hip fracture and is scheduled for surgery tomorrow. Pt lives with her daughter. She is independent at baseline. CSW discussed adjustment after surgery and that recommendation would likely be for SNF. Pt has been been to SNF before, but agrees at this time as she does not want to put too much on her daughter. CSW provided SNF list and explained authorization process. Pt requests [facility].    Employment status:  Retired Nurse, adult PT Recommendations:  Not assessed at this time [place] / Referral to community resources:  [place]  Patient/Family's Response to care:  Pt agreeable to short term SNF at d/c and requests PNC.   Patient/Family's Understanding of and  Emotional Response to Diagnosis, Current Treatment, and Prognosis:  Pt is aware of surgery planned for tomorrow. When discussing SNF, pt states, "It's what I need" and is accepting.   Emotional Assessment Appearance:  Appears stated age Attitude/Demeanor/Rapport:  Other (Pleasant) Affect (typically observed):  Accepting Orientation:  Oriented to Self, Oriented to Place, Oriented to  Time, Oriented to Situation Alcohol / Substance use:  Not Applicable Psych involvement (Current and /or in the community):  No (Comment)  Discharge Needs  Concerns to be addressed:  Discharge Planning Concerns Readmission within the last 30 days:  No Current discharge risk:  Physical Impairment Barriers to Discharge:  Continued Medical Work up   Salome Arnt, [place] 11/02/2015, 12:31 PM [phone]

## 2015-11-02 NOTE — Progress Notes (Addendum)
PROGRESS NOTE                                                                                                                                                                                                             Patient Demographics:    Kathleen [name], is a 82 y.o. female, DOB - 1924/08/21, RUE:[id number]  Admit date - 11/01/2015   Admitting Physician Houston Siren, MD  Outpatient Primary MD for the patient is [name]., MD  LOS - 1  No chief complaint on file.      Brief Narrative  Kathleen [name] is an 82 y.o. 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  She denied LOC, palpitation, but complained of right hip pain.  Xray confirmed mild displaced right intertroch Fx, EKG showed paced rhythm, CXR showed unusual pattern, but no infiltrate, Cr of 1.2, Hb of 11.5 g per dL, and INR of 1.5.  EDP consulted Dr Romeo Apple, and hospitalist was asked to admit her for further Tx.      Subjective:    Kathleen [name] today has, No headache, No chest pain, No abdominal pain - No Nausea, No new weakness tingling or numbness, No Cough - SOB. Mild R Hip pain.   Assessment  & Plan :      1. Mechanical fall with right intertrochanteric femur fracture. Ortho to see, bed rest, pain control, foley -   Cardio-Pulm Risk stratification for surgery and recommendations to minimize the same:-  A.Cardio-Pulmonary Risk -  this patient is a high risk  for adverse Cardio-Pulmonary  Outcome  from surgery, the risks and benefits were discussed and acceptable to the patient and family Recommendations for optimizing Cardio-Pulmonary  Risk risk factors  1. Keep SBP<140, HR<85, use Lopressor 5mg  IV q4hrs PRN, or B.Blocker drip PRN. 2. Moniotr I&Os. 3. Minimal sedation and Narcotics. 4. Good pulmunary toilet. 5.  PRN Nebs and as needed oxygen to keep Pox>90% 6. Hb>8, transfuse as needed- Lasix 10mg  IV after each unit PRBC Transfused.   B.Bleeding Risk - no previous surgical complications, no easy bruising,  anticoagulant prior to surgery was taking Eliquis last dose on 11/01/2015 @ 8am now on heparin bridge.  Lab Results  Component Value Date   PLT 231 11/02/2015                  Lab Results  Component Value Date   INR 1.59 11/02/2015   INR 1.56 11/01/2015   INR 1.59 (H) 10/24/2013      Will request Surgeon to please Order DVT prophylaxis of his/her choice, along with activity, weight bearing precautions and diet if appropriate.      2. Proximal atrial fibrillation, sick sinus syndrome with pacemaker, [place]asc 2 score of 7. Her energy following, continue beta blocker and amiodarone, Eliquis on hold last dose on 11/01/2015 @ 8am, currently on heparin bridge. Monitor.  3. Chronic combined systolic and diastolic heart failure. EF 40%. Currently compensated, continue home dose Lasix, be very cautious with IV fluids, as needed Zaroxolyn. Monitor intake and output. Cardiology following.  4. Dyslipidemia. Continue home dose statin and level was a.  5. Hypertension. On Lopressor continue will add as needed IV Lopressor as well.  6. GERD. On PPI  7. Nonspecific chest x-ray findings. These appear chronic, she is asymptomatic from the standpoint, outpatient age-appropriate workup by PCP as needed.   8. DM type II. Hold Amaryl place on sliding scale.   CBG (last 3)   Recent Labs  11/01/15 2134  GLUCAP 173*      Family Communication  :  Family bedside  Code Status :  DNR  Diet : Heart Healthy  Disposition Plan  :  SNF.   Addendum - [facility], [facility] Dr Roda ShuttersXu, [facility] due to lack of MD support.  Consults  :  Ortho, cards  Procedures  :   DVT Prophylaxis  :   Heparin - Eliquis  Lab Results  Component Value  Date   PLT 231 11/02/2015    Inpatient Medications  Scheduled Meds: . amiodarone  100 mg Oral Daily  . feeding supplement (ENSURE ENLIVE)  237 mL Oral BID BM  . furosemide  40 mg Oral Daily  . Influenza vac split quadrivalent PF  0.5 mL Intramuscular Tomorrow-1000  . metoprolol succinate  75 mg Oral Daily  . pantoprazole  40 mg Oral Daily  . pravastatin  40 mg Oral q1800   Continuous Infusions: . [START ON 11/03/2015] dextrose    . heparin     PRN Meds:.acetaminophen **OR** acetaminophen, fentaNYL (SUBLIMAZE) injection, fentaNYL (SUBLIMAZE) injection  Antibiotics  :    Anti-infectives    None         Objective:   Vitals:   11/01/15 1944 11/01/15 2000 11/01/15 2030 11/02/15 0558  BP:  143/63 (!) 151/68 123/60  Pulse:  69 75 72  Resp:  17 17 17   Temp: 98.4 F (36.9 C)  98.3 F (36.8 C) 98.8 F (37.1 C)  TempSrc: Oral  Oral Oral  SpO2:  93% 96% 93%  Weight:   62.6 kg (138 lb 1.6 oz)   Height:        Wt Readings from Last 3 Encounters:  11/01/15 62.6 kg (138 lb 1.6 oz)  10/17/15 62.4 kg (137 lb 9.6 oz)  10/10/15 63.4 kg (139 lb 11.2 oz)     Intake/Output Summary (Last 24 hours) at 11/02/15 0931 Last data filed at 11/02/15 0900  Gross per 24 hour  Intake              240 ml  Output  450 ml  Net             -210 ml     Physical Exam  Awake Alert, Oriented X 3, No new F.N deficits, Normal affect [place].AT,PERRAL Supple Neck,No JVD, No cervical lymphadenopathy appriciated.  Symmetrical Chest wall movement, Good air movement bilaterally, CTAB RRR,No Gallops,Rubs or new Murmurs, No Parasternal Heave +ve B.Sounds, Abd Soft, No tenderness, No organomegaly appriciated, No rebound - guarding or rigidity. No Cyanosis, Clubbing or edema, No new Rash or bruise, R hip mildly tender    Data Review:    CBC  Recent Labs Lab 11/01/15 1658 11/02/15 0557  WBC 11.8* 9.5  HGB 11.5* 10.3*  HCT 35.3* 31.4*  PLT 263 231  MCV 98.3 98.4  MCH 32.0 32.3    MCHC 32.6 32.8  RDW 14.6 14.8  LYMPHSABS 0.8  --   MONOABS 0.8  --   EOSABS 0.1  --   BASOSABS 0.0  --     Chemistries   Recent Labs Lab 11/01/15 1658 11/02/15 0557  NA 132* 133*  K 4.1 4.4  CL 96* 99*  CO2 28 27  GLUCOSE 180* 214*  BUN 25* 25*  CREATININE 1.24* 1.29*  CALCIUM 8.6* 8.3*  AST 24  --   ALT 22  --   ALKPHOS 120  --   BILITOT 0.8  --    ------------------------------------------------------------------------------------------------------------------ No results for input(s): CHOL, HDL, LDLCALC, TRIG, CHOLHDL, LDLDIRECT in the last 72 hours.  Lab Results  Component Value Date   HGBA1C 8.2 (H) 10/04/2013   ------------------------------------------------------------------------------------------------------------------ No results for input(s): TSH, T4TOTAL, T3FREE, THYROIDAB in the last 72 hours.  Invalid input(s): FREET3 ------------------------------------------------------------------------------------------------------------------ No results for input(s): VITAMINB12, FOLATE, FERRITIN, TIBC, IRON, RETICCTPCT in the last 72 hours.  Coagulation profile  Recent Labs Lab 11/01/15 1658 11/02/15 0750  INR 1.56 1.59    No results for input(s): DDIMER in the last 72 hours.  Cardiac Enzymes No results for input(s): CKMB, TROPONINI, MYOGLOBIN in the last 168 hours.  Invalid input(s): CK ------------------------------------------------------------------------------------------------------------------    Component Value Date/Time   BNP 589.0 (H) 04/30/2015 2050    Micro Results No results found for this or any previous visit (from the past 240 hour(s)).  Radiology Reports Dg Chest 1 View  Result Date: 11/01/2015 CLINICAL DATA:  82 year old female with history of trauma from a fall with pain in the right hip and right femur. EXAM: CHEST 1 VIEW COMPARISON:  Chest x-ray 05/03/2015. FINDINGS: Diffuse peribronchial cuffing. Upper lobe volume loss as  evidenced by upward retraction of the minor fissure and upward retraction of hilar structures bilaterally. Diffuse interstitial prominence, most evident throughout the mid to upper lungs, where there is a reticulonodular pattern which has progressively increased in prominence over several prior examinations. Lower lungs are relatively clear. No pleural effusions. No evidence of pulmonary edema no pneumothorax. Heart size is mildly enlarged. Upper mediastinal contours are within normal limits. Atherosclerosis in the thoracic aorta. Left-sided biventricular pacemaker device in place with lead tips projecting over the expected location of the right atrium, right ventricle, and overlying the lateral wall the left ventricle via the coronary sinus and coronary veins. Bony thorax appears grossly intact. IMPRESSION: 1. No definite evidence to suggest significant acute traumatic injury to the thorax. Highly unusual appearance of the lungs, as above. This appearance can be seen in the setting of sarcoidosis. Other differential considerations include sequela of chronic hypersensitivity pneumonitis or sequela of chronic recurrent infections. These findings could be best evaluated  with followup nonemergent high-resolution chest CT if clinically appropriate. 2. Mild cardiomegaly. 3. Aortic atherosclerosis. Electronically Signed   By: Trudie Reed M.D.   On: 11/01/2015 17:00   Dg Pelvis 1-2 Views  Result Date: 11/01/2015 CLINICAL DATA:  Fall.  Pain. EXAM: PELVIS - 1-2 VIEW COMPARISON:  03/12/2015. FINDINGS: A fracture along the greater trochanter of the right femur is noted. This is new from prior study of 03/12/2015. Right hip series suggested for further evaluation. Old right inferior pubic ramus fracture. Degenerative changes lumbar spine and both hips. Diffuse osteopenia. IMPRESSION: A fracture is noted along the greater trochanter of the right femur. This is new from prior study 03/12/2015. Right hip series suggested  for further evaluation. Electronically Signed   By: Maisie Fus  Register   On: 11/01/2015 16:55   Ct Hip Right Wo Contrast  Result Date: 11/02/2015 CLINICAL DATA:  82 year old female right hip fracture. EXAM: CT OF THE RIGHT HIP WITHOUT CONTRAST TECHNIQUE: Multidetector CT imaging of the right hip was performed according to the standard protocol. Multiplanar CT image reconstructions were also generated. COMPARISON:  Radiograph dated 11/01/2015 FINDINGS: Bones/Joint/Cartilage There is a comminuted intratrochanteric fracture of the right femur with mild proximal migration and impaction of the femoral shaft and the femoral neck. There is associated mild varus angulation. Evaluation of the fracture is however limited due to advanced osteopenia. There is mild posterior displacement of the fracture fragment of the greater trochanter. There is no dislocation. Ligaments Suboptimally assessed by CT. Muscles and Tendons Mild edema of the right thigh musculature. No fluid collection or hematoma. Soft tissues No fluid collection or hematoma. A Foley catheter is noted within the bladder. There is scattered colonic diverticula. IMPRESSION: Comminuted intertrochanteric fracture of the right femur. Advanced osteopenia. Electronically Signed   By: Elgie Collard M.D.   On: 11/02/2015 03:31   Dg Bone Density  Result Date: 10/11/2015 EXAM: DUAL X-RAY ABSORPTIOMETRY (DXA) FOR BONE MINERAL DENSITY IMPRESSION: Ordering Physician:  Dr. Elfredia Nevins, Your patient Prince Solian completed a BMD test on 10/11/2015 using the Lunar Prodigy DXA System (software version: 14.10) manufactured by [employer]. The following summarizes the results of our evaluation. PATIENT BIOGRAPHICAL: Name: CECIL, BIXBY Patient ID: [id number] Birth Date: Feb 04, 1924 Height: 65.0 in. Gender: Female Exam Date: 10/11/2015 Weight: 138.0 lbs. Indications: Advanced Age, Caucasian, Follow up Osteoporosis, Height Loss, History of Fracture  (Adult), Low Calcium Intake, Post Menopausal Fractures: Pelvis, Wrist Treatments: Multivitamin DENSITOMETRY RESULTS: Site      Region      Measured Date Measured Age WHO Classification Young Adult T-score BMD         %Change vs. Previous Significant Change (*) AP Spine L1-L2 10[age]11/[age] 88.10 Osteoporosis -5.5 0.507 g/cm2 -35.7% Yes AP Spine L1-L2 12/10/2010 83.9 Osteoporosis -3.1 0.789 g/cm2 - - DualFemur Total Right 10/11/2015 88.8 Osteoporosis -3.8 0.523 g/cm2 -16.5% Yes DualFemur Total Right 12/10/2010 83.9 Osteoporosis -3.0 0.626 g/cm2 - - ASSESSMENT: BMD as determined from AP Spine L1-L2 is 0.507 g/cm2 with a T-Score of -5.5. This patient is considered osteoporotic according to World Health Organization [facility]) criteria. Compared with the prior study on 12/10/10, the BMD of the lumbar spine/total hip show a statistically significant decrease. (L-3 and L-4 were excluded due to advanced degenerative changes.) World Health Organization [facility]) criteria for post-menopausal, Caucasian Women: Normal:       T-score at or above -1 SD Osteopenia:   T-score between -1 and -2.5 SD Osteoporosis: T-score at or below -2.5 SD RECOMMENDATIONS: National Osteoporosis  Foundation recommends that FDA-approved medial therapies be considered in postmenopausal women and men age 71 or older with a: 1. Hip or vertebral (clinical or morphometric) fracture. 2. T-Score of < -2.5 at the spine or hip. 3. Ten-year fracture probability by FRAX of 3% or greater for hip fracture or 20% or greater for major osteoporotic fracture. All treatment decisions require clinical judgment and consideration of individual patient factors, including patient preferences, co-morbidities, previous drug use, risk factors not captured in the FRAX model (e.g. falls, vitamin D deficiency, increased bone turnover, interval significant decline in bone density) and possible under-or over-estimation of fracture risk by FRAX. All patients should ensure an adequate intake of  dietary calcium (1200 mg/d) and vitamin D (800 IU daily) unless contraindicated. FOLLOW-UP: People with diagnosed cases of osteoporosis or osteopenia should be regularly tested for bone mineral density. For patients eligible for Medicare, routine testing is allowed once every 2 years. Testing frequency can be increased for patients who have rapidly progressing disease, or for those who are receiving medical therapy to restore bone mass. I have reviewed this report, and agree with the above findings. [facility] Electronically Signed   By: Amie [place] M.D.   On: 10/11/2015 13:51   Dg Femur Min 2 Views Right  Result Date: 11/01/2015 CLINICAL DATA:  Status post fall.  Right hip pain. EXAM: RIGHT FEMUR 2 VIEWS COMPARISON:  None. FINDINGS: Minimally displaced right intertrochanteric hip fracture. No other fracture or dislocation. Right knee arthroplasty. Peripheral vascular atherosclerotic disease. IMPRESSION: 1. Minimally displaced acute right intertrochanteric hip fracture. Electronically Signed   By: Elige Ko   On: 11/01/2015 16:53    Time Spent in minutes  30   Susa Raring K M.D on 11/02/2015 at 9:31 AM  Between 7am to 7pm - Pager - [phone]  After 7pm go to [email or web address] - password [facility]  [facility] -  Office  [phone]

## 2015-11-02 NOTE — NC FL2 (Signed)
[facility] MEDICAID FL2 LEVEL OF CARE SCREENING TOOL     IDENTIFICATION  Patient Name: Kathleen [name] Birthdate: 07-Jan-1925 Sex: female Admission Date (Current Location): 11/01/2015  [facility] and [place] Number:  [employer] and Address:  [facility],  [address], [place] [address]      Provider Number: [id number]  Attending Physician Name and Address:  Leroy Sea, MD  Relative Name and Phone Number:       Current Level of Care: Hospital Recommended Level of Care: Skilled Nursing Facility Prior Approval Number:    Date Approved/Denied:   PASRR Number: [id number]  Discharge Plan: SNF    Current Diagnoses: Patient Active Problem List   Diagnosis Date Noted  . Closed displaced intertrochanteric fracture of right femur (HCC)   . Hip fracture (HCC) 11/01/2015  . Acute on chronic combined systolic and diastolic CHF, NYHA class 1 (HCC) 05/02/2015  . Acute on chronic diastolic CHF (congestive heart failure) (HCC) 05/01/2015  . Pain in the chest   . Chest pain 04/30/2015  . Pelvis fracture, closed, initial encounter 02/19/2015  . Distal radial epiphysitis 02/19/2015  . Hypercortisolemia (HCC) 10/03/2014  . UTI (urinary tract infection) 10/05/2013  . Atrial fibrillation with RVR (HCC) 10/04/2013  . Hypoxia 10/04/2013  . Generalized weakness 10/04/2013  . Chronic combined systolic and diastolic CHF (congestive heart failure) (HCC) 10/20/2012  . CKD (chronic kidney disease) stage 3, GFR 30-59 ml/min 10/20/2012  . Cardiomyopathy, dilated, nonischemic 10/14/2011  . CRT - pacemaker [employer] 10/14/2011  . Hypokalemia 10/12/2011  . Acute systolic CHF (congestive heart failure) (HCC) 10/11/2011  . Acute renal insufficiency, Scr was WNL in April 2013 10/11/2011  . Anemia 10/11/2011  . Unstable angina (HCC) 10/11/2011  . Gait abnormality 05/13/2011  . Syncope 03/25/2011  . COPD on CXR 03/25/2011  . Coronary atherosclerosis, no significant  obstructive lesions 03/25/2011  . Symptomatic advanced heart block, 2:1 AV block, RT BBB, Lt post. fas. block.(March 2013) 03/23/2011  . Paroxysmal atrial fibrillation (HCC) 03/23/2011  . Lung nodule 12/13/2010  . DM type 2 (diabetes mellitus, type 2) (HCC) 12/13/2010  . Hyperlipidemia 12/13/2010  . TOTAL KNEE FOLLOW-UP 06/20/2009  . MONONEURITIS, LEG 02/19/2009  . KNEE, ARTHRITIS, DEGEN./OSTEO 11/30/2008  . SPINAL STENOSIS 07/04/2008  . HEMARTHROSIS, LOWER LEG 05/10/2008  . DIABETES 05/03/2008  . Effusion of lower leg joint 05/03/2008  . KNEE PAIN 05/03/2008  . Hypertension 05/03/2008    Orientation RESPIRATION BLADDER Height & Weight     Self, Time, Situation, Place  Normal Indwelling catheter Weight: 138 lb 1.6 oz (62.6 kg) Height:  5\' 5"  (165.1 cm)  BEHAVIORAL SYMPTOMS/MOOD NEUROLOGICAL BOWEL NUTRITION STATUS  Other (Comment) (none)  (n/a) Continent Diet (Carb modified)  AMBULATORY STATUS COMMUNICATION OF NEEDS Skin   Extensive Assist Verbally Surgical wounds                       Personal Care Assistance Level of Assistance  Bathing, Feeding, Dressing Bathing Assistance: Maximum assistance Feeding assistance: Limited assistance Dressing Assistance: Maximum assistance     Functional Limitations Info  Sight, Hearing, Speech Sight Info: Impaired Hearing Info: Impaired Speech Info: Adequate    SPECIAL CARE FACTORS FREQUENCY  PT (By licensed PT)     PT Frequency: daily              Contractures      Additional Factors Info  Code Status, Allergies Code Status Info: DNR Allergies Info: Coumadin (Warfarin Sodium),  Meloxicam, Metformin and Related, Morphine, Nabumetone, Oxycodone, Sulfonamide Derivatives           Current Medications (11/02/2015):  This is the current hospital active medication list Current Facility-Administered Medications  Medication Dose Route Frequency Provider Last Rate Last Dose  . acetaminophen (TYLENOL) tablet 650 mg  650 mg  Oral Q6H PRN Houston SirenPeter Le, MD       Or  . acetaminophen (TYLENOL) suppository 650 mg  650 mg Rectal Q6H PRN Houston SirenPeter Le, MD      . amiodarone (PACERONE) tablet 100 mg  100 mg Oral Daily Houston SirenPeter Le, MD   100 mg at 11/02/15 [id number]  . [START ON 11/03/2015] dextrose 5 % solution   Intravenous Continuous Leroy SeaPrashant K Singh, MD      . feeding supplement (ENSURE ENLIVE) (ENSURE ENLIVE) liquid 237 mL  237 mL Oral BID BM Houston SirenPeter Le, MD   237 mL at 11/02/15 1000  . fentaNYL (SUBLIMAZE) injection 50 mcg  50 mcg Intravenous Q1H PRN Houston SirenPeter Le, MD   50 mcg at 11/02/15 [id number]  . fentaNYL (SUBLIMAZE) injection 50 mcg  50 mcg Intravenous Q1H PRN Houston SirenPeter Le, MD   50 mcg at 11/02/15 0734  . furosemide (LASIX) tablet 40 mg  40 mg Oral Daily Jodelle GrossKathryn M Lawrence, NP   40 mg at 11/02/15 [id number]  . heparin ADULT infusion 100 units/mL (25000 units/22050mL sodium chloride 0.45%)  900 Units/hr Intravenous Continuous Leroy SeaPrashant K Singh, MD 9 mL/hr at 11/02/15 0932 900 Units/hr at 11/02/15 0932  . insulin aspart (novoLOG) injection 0-5 Units  0-5 Units Subcutaneous QHS Leroy SeaPrashant K Singh, MD      . insulin aspart (novoLOG) injection 0-9 Units  0-9 Units Subcutaneous TID WC Leroy SeaPrashant K Singh, MD      . metoprolol (LOPRESSOR) injection 5 mg  5 mg Intravenous Q4H PRN Leroy SeaPrashant K Singh, MD      . metoprolol succinate (TOPROL-XL) 24 hr tablet 75 mg  75 mg Oral Daily Houston SirenPeter Le, MD   75 mg at 11/02/15 [id number]  . pantoprazole (PROTONIX) EC tablet 40 mg  40 mg Oral Daily Houston SirenPeter Le, MD   40 mg at 11/02/15 [id number]  . pravastatin (PRAVACHOL) tablet 40 mg  40 mg Oral q1800 Houston SirenPeter Le, MD   40 mg at 11/01/15 2058     Discharge Medications: Please see discharge summary for a list of discharge medications.  Relevant Imaging Results:  Relevant Lab Results:   Additional Information SSN: [id number]  Karn CassisStultz, Elika Godar [place], [place] [phone]

## 2015-11-02 NOTE — Progress Notes (Signed)
ANTICOAGULATION CONSULT NOTE - Initial Consult  Pharmacy Consult for Heparin  Indication: atrial fibrillation  Allergies  Allergen Reactions  . Coumadin [Warfarin Sodium] Other (See Comments)    Caused Patient to Bleed Out.   . Meloxicam Other (See Comments)    Unknown  . Metformin And Related Other (See Comments)    Cannot have due to kidney function  . Morphine Itching    Not in right state of mind.   . Nabumetone Nausea And Vomiting  . Oxycodone Nausea And Vomiting  . Sulfonamide Derivatives Hives    Patient Measurements: Height: 5\' 5"  (165.1 cm) Weight: 138 lb 1.6 oz (62.6 kg) IBW/kg (Calculated) : 57 HEPARIN DW (KG): 62.1  Vital Signs: Temp: 99.4 F (37.4 C) (10/20 1343) BP: 128/56 (10/20 1343) Pulse Rate: 70 (10/20 1343)  Labs:  Recent Labs  11/01/15 1658 11/02/15 0557 11/02/15 0750 11/02/15 1638  HGB 11.5* 10.3*  --   --   HCT 35.3* 31.4*  --   --   PLT 263 231  --   --   APTT  --   --  39* 104*  LABPROT 18.8*  --  19.1*  --   INR 1.56  --  1.59  --   HEPARINUNFRC  --   --  1.69* 3.22*  CREATININE 1.24* 1.29*  --   --     Estimated Creatinine Clearance: 27.1 mL/min (by C-G formula based on SCr of 1.29 mg/dL (H)).   Medical History: Past Medical History:  Diagnosis Date  . Acid reflux   . 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 CAD (coronary artery disease)   . Chronic kidney disease (CKD), stage III (moderate)   . Essential hypertension   . History of pneumonia   . Hyperlipidemia   . Nonischemic cardiomyopathy (HCC)   . PAF (paroxysmal atrial fibrillation) (HCC)   . Type 2 diabetes mellitus (HCC)     Medications:  Prescriptions Prior to Admission  Medication Sig Dispense Refill Last Dose  . amiodarone (PACERONE) 200 MG tablet Take 0.5 tablets (100 mg total) by mouth daily. 30 tablet 10 11/01/2015 at Unknown time  . Coenzyme Q10 (CO Q 10 PO) Take 1 tablet by mouth daily.   11/01/2015 at Unknown time  . ELIQUIS 2.5 MG  TABS tablet TAKE ONE TABLET BY MOUTH TWICE DAILY. 60 tablet 5 11/01/2015 at 830a  . furosemide (LASIX) 40 MG tablet Take 40 mg by mouth daily. 3 lbs weight gain in 24 hours extra half tablet ( 20 mg )   11/01/2015 at Unknown time  . glimepiride (AMARYL) 1 MG tablet Take 0.5 mg by mouth as needed (for blood sugar levels over 120).    10/30/2015 at Unknown time  . HYDROcodone-acetaminophen (NORCO/VICODIN) 5-325 MG tablet Take 0.5-1 tablets by mouth every 4 (four) hours as needed for severe pain. 30 tablet 0 Past Week at Unknown time  . lubiprostone (AMITIZA) 24 MCG capsule Take 24 mcg by mouth 2 (two) times daily with a meal.   11/01/2015 at Unknown time  . meclizine (ANTIVERT) 25 MG tablet Take 25 mg by mouth 3 (three) times daily as needed for dizziness.   unknown  . metolazone (ZAROXOLYN) 2.5 MG tablet Take 2.5 mg by mouth daily. If patients weighs over 140 take 1 tablet (2.5 mg ) with extra half of lasix (20 mg )   unknown  . metoprolol succinate (TOPROL-XL) 25 MG 24 hr tablet TAKE 3 TABLETS BY MOUTH  DAILY. 90 tablet 8 11/01/2015 at 830a  . Multiple Vitamins-Minerals (CENTRUM SILVER PO) Take 1 tablet by mouth every morning.    11/01/2015 at Unknown time  . Multiple Vitamins-Minerals (ICAPS AREDS 2) CAPS Take 1 capsule by mouth 2 (two) times daily.   11/01/2015 at Unknown time  . nitroGLYCERIN (NITROSTAT) 0.4 MG SL tablet DISSOLVE 1 TABLET UNDER TONGUE EVERY 5 MINUTES UP TO 15 MIN FOR CHESTPAIN. IF NO RELIEF CALL 911. 25 tablet 4 Past Week at Unknown time  . omeprazole (PRILOSEC) 20 MG capsule TAKE ONE CAPSULE BY MOUTH DAILY. 30 capsule 6 11/01/2015 at Unknown time  . penicillin v potassium (VEETID) 500 MG tablet Take 500 mg by mouth once. Prior to dental procedure   10/30/2015 at Unknown time  . potassium chloride (KLOR-CON) 8 MEQ tablet Take 1 tablet (8 mEq total) by mouth 3 (three) times daily. 270 tablet 3 11/01/2015 at mormimg  . pravastatin (PRAVACHOL) 40 MG tablet TAKE 1 TABLET BY MOUTH EACH  EVENING. 30 tablet 10 10/31/2015 at Unknown time  . ondansetron (ZOFRAN) 4 MG tablet Take 4 mg by mouth every 4 (four) hours as needed for nausea or vomiting.   unknown    Assessment: 82 y.o.female with known history of long-standing nonischemic cardiomyopathy, biventricular pacemaker in situ, paroxysmal atrial fibrillation on amiodarone and ELIQUIS therapy, CHADS VASC Score of 7. The patient was admitted after sustaining a right intertrochanteric hip fracture after falling at home. Heparin coverage until surgery is being implemented. Patients last dose of eliquis 10/19 at 0830. HL does not correlate with APTT which is just slightly elevated. Will adjust infusion. Plans for surgery in AM  Goal of Therapy:  APTT 66-102 Heparin level 0.3-0.7 units/ml Monitor platelets by anticoagulation protocol: Yes   Plan:  decrease heparin infusion at 800 units/hr Check anti-Xa level and aPTTl in 8 hours and daily while on heparin Continue to monitor H&H and platelets Discontinue Heparin infusion at midnight for surgery in AM  Elder Cyphers, BS Loura Back, BCPS Clinical Pharmacist Pager [phone]  11/02/2015,6:18 PM

## 2015-11-02 NOTE — Progress Notes (Signed)
Patient arrived to unit in stable condition, heparin infusing at 9 units/hr, patient oriented to room, call light within reach, family at bedside,

## 2015-11-02 NOTE — Progress Notes (Signed)
Received order to transfer patient to [facility]). Patient's family was notified of transfer by physician. Patient's family is in room and was notified of transfer to  [place] [facility] [address]. Report was given to Lavell Luster RN [facility] [place]). All questions were answered and no further questions at this time. Report was also given to [facility].

## 2015-11-02 NOTE — Progress Notes (Signed)
Report given to care link. Pt in stable condition and no acute distress at time of transfer.

## 2015-11-02 NOTE — Progress Notes (Signed)
Initial Nutrition Assessment  DOCUMENTATION CODES:   Non-severe (moderate) malnutrition in context of chronic illness  INTERVENTION:   - Continue Ensure Enlive oral nutrition supplement BID. Each provides 350 kcal and 20 grams protein. - Encourage PO intake. - Will continue to monitor for nutrition-related needs.  NUTRITION DIAGNOSIS:   Malnutrition related to chronic illness as evidenced by mild depletion of body fat, moderate depletions of muscle mass.  GOAL:   Patient will meet greater than or equal to 90% of their needs  MONITOR:   PO intake, Supplement acceptance, Labs, Weight trends  REASON FOR ASSESSMENT:   Malnutrition Screening Tool   ASSESSMENT:   82 y.o. 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  She denied LOC, palpitation, but complained of right hip pain.  Xray confirmed mild displaced right intertroch Fx, EKG showed paced rhythm, CXR showed unusual pattern, but no infiltrate, Cr of 1.2, Hb of 11.5 g per dL, and INR of 1.5.  EDP consulted Dr Romeo Apple, and hospitalist was asked to admit her for further Tx.  Spoke with pt at bedside who reports having a good appetite PTA. Pt states that her daughter does not think she has a good appetite. PTA, pt consumed 3 meals per day with snacks and at least 2 Ensure oral nutrition supplements. Pt reports consuming toast with jelly for breakfast PTA. Pt states lunch would typically be a sandwich; dinner would include a meat and 2 vegetables. Pt states she "tries to" cook at home. Pt enjoys Ensure Enlive oral nutrition supplements; will continue.  Pt reports UBW is 135# and that she has been losing weight recently. Pt reveals her highest weight was 168# over 2 years ago. Per chart, pt has lost 6.7 kg in past 7 months (9.7%, insignificant for timeframe).  Pt reports taking "all of my vitamins" PTA. Pt  unsure which vitamins these are. Pt able to ambulate "if I'm careful."  Noted surgery scheduled for 11/03/15 AM.  Medications reviewed and include 40 mg Lasix daily, sliding scale Novolog, 40 mg Potonix daily, 40 mg pravastatin daily, IV heparin @ 9 mL/hr  Labs reviewed and include low sodium (133 mmol/L), low chloride (99 mmol/L), elevated BUN (25 mg/dL), elevated creatinine (1.29 mg/dL), low calcium (8.3 mg/dL) CBGs: 366, 440 mg/dL  NFPE: Exam completed. Mild fat depletion, mild/moderate muscle depletion, and no edema noted.  Diet Order:  Diet Carb Modified Fluid consistency: Thin; Room service appropriate? Yes  Skin:  Reviewed, no issues  Last BM:  10/31/15  Height:   Ht Readings from Last 1 Encounters:  11/01/15 5\' 5"  (1.651 m)    Weight:   Wt Readings from Last 1 Encounters:  11/01/15 138 lb 1.6 oz (62.6 kg)    Ideal Body Weight:  56.8 kg  BMI:  Body mass index is 22.98 kg/m.  Estimated Nutritional Needs:   Kcal:  1550-1750 (25-28 kcal/kg)  Protein:  75-88 grams (1.2-1.4 g/kg)  Fluid:  1.5 L/day  EDUCATION NEEDS:   No education needs identified at this time  Rosemarie Ax Dietetic Intern Pager Number: [phone]

## 2015-11-02 NOTE — Consult Note (Addendum)
Reason for Consult: rt hip fracture Referring Physician: P. Candiss Norse, MD  Kathleen [name] is an 82 y.o. female.  HPI: 82 year old female on Eliquis for cardiac disease presents with a right hip fracture after falling at home cleaning a ceiling fan.  She has: Pain right hip Severe Nonradiating 1 day She is unable to weight-bear She has external rotation shortening deformity of the right leg    Past Medical History:  Diagnosis Date  . Acid reflux   . 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 CAD (coronary artery disease)   . Chronic kidney disease (CKD), stage III (moderate)   . Essential hypertension   . History of pneumonia   . Hyperlipidemia   . Nonischemic cardiomyopathy ([place])   . PAF (paroxysmal atrial fibrillation) ([place])   . Type 2 diabetes mellitus ([place])     Past Surgical History:  Procedure Laterality Date  . Bladder tack  1970's  . CARDIAC CATHETERIZATION  10/15/2011   Normal coronaries  . CARDIOVERSION N/A 10/28/2013   Procedure: CARDIOVERSION;  Surgeon: Sanda Klein, MD;  Location: [facility] ENDOSCOPY;  Service: Cardiovascular;  Laterality: N/A;  . CHOLECYSTECTOMY  1999  . LEFT AND RIGHT HEART CATHETERIZATION WITH CORONARY ANGIOGRAM N/A 10/15/2011   Procedure: LEFT AND RIGHT HEART CATHETERIZATION WITH CORONARY ANGIOGRAM;  Surgeon: Pixie Casino, MD;  Location: [facility] CATH LAB;  Service: Cardiovascular;  Laterality: N/A;  . NM MYOCAR PERF WALL MOTION  03/15/2009   Normal  . PACEMAKER INSERTION  10/16/2011   Medtronic  . PERMANENT PACEMAKER INSERTION N/A 03/24/2011   Procedure: PERMANENT PACEMAKER INSERTION;  Surgeon: Sanda Klein, MD;  Location: [facility] CATH LAB;  Service: Cardiovascular;  Laterality: N/A;  . REPLACEMENT TOTAL KNEE      Family History  Problem Relation Age of Onset  . Suicidality Mother   . Heart murmur Daughter   . Heart attack Brother     Social History:  reports that she has never smoked. She has never used smokeless tobacco. She  reports that she does not drink alcohol or use drugs.  Allergies:  Allergies  Allergen Reactions  . Coumadin [Warfarin Sodium] Other (See Comments)    Caused Patient to Bleed Out.   . Meloxicam Other (See Comments)    Unknown  . Metformin And Related Other (See Comments)    Cannot have due to kidney function  . Morphine Itching    Not in right state of mind.   . Nabumetone Nausea And Vomiting  . Oxycodone Nausea And Vomiting  . Sulfonamide Derivatives Hives     Current Facility-Administered Medications:  .  acetaminophen (TYLENOL) tablet 650 mg, 650 mg, Oral, Q6H PRN **OR** acetaminophen (TYLENOL) suppository 650 mg, 650 mg, Rectal, Q6H PRN, Orvan Falconer, MD .  amiodarone (PACERONE) tablet 100 mg, 100 mg, Oral, Daily, Orvan Falconer, MD .  Derrill Memo ON 11/03/2015] dextrose 5 % and 0.2 % NaCl infusion, , Intravenous, Continuous, Thurnell Lose, MD .  feeding supplement (ENSURE ENLIVE) (ENSURE ENLIVE) liquid 237 mL, 237 mL, Oral, BID BM, Orvan Falconer, MD .  fentaNYL (SUBLIMAZE) injection 50 mcg, 50 mcg, Intravenous, Q1H PRN, Orvan Falconer, MD, 50 mcg at 11/01/15 1941 .  fentaNYL (SUBLIMAZE) injection 50 mcg, 50 mcg, Intravenous, Q1H PRN, Orvan Falconer, MD, 50 mcg at 11/02/15 0734 .  Influenza vac split quadrivalent PF (FLUARIX) injection 0.5 mL, 0.5 mL, Intramuscular, Tomorrow-1000, Orvan Falconer, MD .  metoprolol succinate (TOPROL-XL) 24 hr tablet 75 mg, 75 mg, Oral, Daily,  Orvan Falconer, MD .  pantoprazole (PROTONIX) EC tablet 40 mg, 40 mg, Oral, Daily, Orvan Falconer, MD .  pravastatin (PRAVACHOL) tablet 40 mg, 40 mg, Oral, q1800, Orvan Falconer, MD, 40 mg at 11/01/15 2058  Results for orders placed or performed during the hospital encounter of 11/01/15 (from the past 48 hour(s))  CBC WITH DIFFERENTIAL     Status: Abnormal   Collection Time: 11/01/15  4:58 PM  Result Value Ref Range   WBC 11.8 (H) 4.0 - 10.5 K/uL   RBC 3.59 (L) 3.87 - 5.11 MIL/uL   Hemoglobin 11.5 (L) 12.0 - 15.0 g/dL   HCT 35.3 (L) 36.0 - 46.0 %   MCV  98.3 78.0 - 100.0 fL   MCH 32.0 26.0 - 34.0 pg   MCHC 32.6 30.0 - 36.0 g/dL   RDW 14.6 11.5 - 15.5 %   Platelets 263 150 - 400 K/uL   Neutrophils Relative % 85 %   Neutro Abs 10.1 (H) 1.7 - 7.7 K/uL   Lymphocytes Relative 7 %   Lymphs Abs 0.8 0.7 - 4.0 K/uL   Monocytes Relative 7 %   Monocytes Absolute 0.8 0.1 - 1.0 K/uL   Eosinophils Relative 1 %   Eosinophils Absolute 0.1 0.0 - 0.7 K/uL   Basophils Relative 0 %   Basophils Absolute 0.0 0.0 - 0.1 K/uL  Protime-INR     Status: Abnormal   Collection Time: 11/01/15  4:58 PM  Result Value Ref Range   Prothrombin Time 18.8 (H) 11.4 - 15.2 seconds   INR 1.56   Comprehensive metabolic panel     Status: Abnormal   Collection Time: 11/01/15  4:58 PM  Result Value Ref Range   Sodium 132 (L) 135 - 145 mmol/L   Potassium 4.1 3.5 - 5.1 mmol/L   Chloride 96 (L) 101 - 111 mmol/L   CO2 28 22 - 32 mmol/L   Glucose, Bld 180 (H) 65 - 99 mg/dL   BUN 25 (H) 6 - 20 mg/dL   Creatinine, Ser 1.24 (H) 0.44 - 1.00 mg/dL   Calcium 8.6 (L) 8.9 - 10.3 mg/dL   Total Protein 6.7 6.5 - 8.1 g/dL   Albumin 3.4 (L) 3.5 - 5.0 g/dL   AST 24 15 - 41 U/L   ALT 22 14 - 54 U/L   Alkaline Phosphatase 120 38 - 126 U/L   Total Bilirubin 0.8 0.3 - 1.2 mg/dL   GFR calc non Af Amer 38 (L) >60 mL/min   GFR calc Af Amer 44 (L) >60 mL/min    Comment: (NOTE) The eGFR has been calculated using the CKD EPI equation. This calculation has not been validated in all clinical situations. eGFR's persistently <60 mL/min signify possible Chronic Kidney Disease.    Anion gap 8 5 - 15  Type and screen [facility]     Status: None   Collection Time: 11/01/15  5:04 PM  Result Value Ref Range   ABO/RH(D) A NEG    Antibody Screen NEG    Sample Expiration 11/04/2015   Urinalysis, Routine w reflex microscopic (not at [facility])     Status: None   Collection Time: 11/01/15  5:28 PM  Result Value Ref Range   Color, Urine YELLOW YELLOW   APPearance CLEAR CLEAR   Specific  Gravity, Urine 1.015 1.005 - 1.030   pH 6.0 5.0 - 8.0   Glucose, UA NEGATIVE NEGATIVE mg/dL   Hgb urine dipstick NEGATIVE NEGATIVE   Bilirubin Urine NEGATIVE NEGATIVE  Ketones, ur NEGATIVE NEGATIVE mg/dL   Protein, ur NEGATIVE NEGATIVE mg/dL   Nitrite NEGATIVE NEGATIVE   Leukocytes, UA NEGATIVE NEGATIVE    Comment: MICROSCOPIC NOT DONE ON URINES WITH NEGATIVE PROTEIN, BLOOD, LEUKOCYTES, NITRITE, OR GLUCOSE <1000 mg/dL.  Glucose, capillary     Status: Abnormal   Collection Time: 11/01/15  9:34 PM  Result Value Ref Range   Glucose-Capillary 173 (H) 65 - 99 mg/dL   Comment 1 Notify RN    Comment 2 Document in Chart   Basic metabolic panel     Status: Abnormal   Collection Time: 11/02/15  5:57 AM  Result Value Ref Range   Sodium 133 (L) 135 - 145 mmol/L   Potassium 4.4 3.5 - 5.1 mmol/L   Chloride 99 (L) 101 - 111 mmol/L   CO2 27 22 - 32 mmol/L   Glucose, Bld 214 (H) 65 - 99 mg/dL   BUN 25 (H) 6 - 20 mg/dL   Creatinine, Ser 1.29 (H) 0.44 - 1.00 mg/dL   Calcium 8.3 (L) 8.9 - 10.3 mg/dL   GFR calc non Af Amer 36 (L) >60 mL/min   GFR calc Af Amer 42 (L) >60 mL/min    Comment: (NOTE) The eGFR has been calculated using the CKD EPI equation. This calculation has not been validated in all clinical situations. eGFR's persistently <60 mL/min signify possible Chronic Kidney Disease.    Anion gap 7 5 - 15  CBC     Status: Abnormal   Collection Time: 11/02/15  5:57 AM  Result Value Ref Range   WBC 9.5 4.0 - 10.5 K/uL   RBC 3.19 (L) 3.87 - 5.11 MIL/uL   Hemoglobin 10.3 (L) 12.0 - 15.0 g/dL   HCT 31.4 (L) 36.0 - 46.0 %   MCV 98.4 78.0 - 100.0 fL   MCH 32.3 26.0 - 34.0 pg   MCHC 32.8 30.0 - 36.0 g/dL   RDW 14.8 11.5 - 15.5 %   Platelets 231 150 - 400 K/uL    Dg Chest 1 View  Result Date: 11/01/2015 CLINICAL DATA:  82 year old female with history of trauma from a fall with pain in the right hip and right femur. EXAM: CHEST 1 VIEW COMPARISON:  Chest x-ray 05/03/2015. FINDINGS: Diffuse  peribronchial cuffing. Upper lobe volume loss as evidenced by upward retraction of the minor fissure and upward retraction of hilar structures bilaterally. Diffuse interstitial prominence, most evident throughout the mid to upper lungs, where there is a reticulonodular pattern which has progressively increased in prominence over several prior examinations. Lower lungs are relatively clear. No pleural effusions. No evidence of pulmonary edema no pneumothorax. Heart size is mildly enlarged. Upper mediastinal contours are within normal limits. Atherosclerosis in the thoracic aorta. Left-sided biventricular pacemaker device in place with lead tips projecting over the expected location of the right atrium, right ventricle, and overlying the lateral wall the left ventricle via the coronary sinus and coronary veins. Bony thorax appears grossly intact. IMPRESSION: 1. No definite evidence to suggest significant acute traumatic injury to the thorax. Highly unusual appearance of the lungs, as above. This appearance can be seen in the setting of sarcoidosis. Other differential considerations include sequela of chronic hypersensitivity pneumonitis or sequela of chronic recurrent infections. These findings could be best evaluated with followup nonemergent high-resolution chest CT if clinically appropriate. 2. Mild cardiomegaly. 3. Aortic atherosclerosis. Electronically Signed   By: Vinnie Langton M.D.   On: 11/01/2015 17:00   Dg Pelvis 1-2 Views  Result Date: 11/01/2015  CLINICAL DATA:  Fall.  Pain. EXAM: PELVIS - 1-2 VIEW COMPARISON:  03/12/2015. FINDINGS: A fracture along the greater trochanter of the right femur is noted. This is new from prior study of 03/12/2015. Right hip series suggested for further evaluation. Old right inferior pubic ramus fracture. Degenerative changes lumbar spine and both hips. Diffuse osteopenia. IMPRESSION: A fracture is noted along the greater trochanter of the right femur. This is new from  prior study 03/12/2015. Right hip series suggested for further evaluation. Electronically Signed   By: Marcello Moores  Register   On: 11/01/2015 16:55   Ct Hip Right Wo Contrast  Result Date: 11/02/2015 CLINICAL DATA:  82 year old female right hip fracture. EXAM: CT OF THE RIGHT HIP WITHOUT CONTRAST TECHNIQUE: Multidetector CT imaging of the right hip was performed according to the standard protocol. Multiplanar CT image reconstructions were also generated. COMPARISON:  Radiograph dated 11/01/2015 FINDINGS: Bones/Joint/Cartilage There is a comminuted intratrochanteric fracture of the right femur with mild proximal migration and impaction of the femoral shaft and the femoral neck. There is associated mild varus angulation. Evaluation of the fracture is however limited due to advanced osteopenia. There is mild posterior displacement of the fracture fragment of the greater trochanter. There is no dislocation. Ligaments Suboptimally assessed by CT. Muscles and Tendons Mild edema of the right thigh musculature. No fluid collection or hematoma. Soft tissues No fluid collection or hematoma. A Foley catheter is noted within the bladder. There is scattered colonic diverticula. IMPRESSION: Comminuted intertrochanteric fracture of the right femur. Advanced osteopenia. Electronically Signed   By: Anner Crete M.D.   On: 11/02/2015 03:31   Dg Femur Min 2 Views Right  Result Date: 11/01/2015 CLINICAL DATA:  Status post fall.  Right hip pain. EXAM: RIGHT FEMUR 2 VIEWS COMPARISON:  None. FINDINGS: Minimally displaced right intertrochanteric hip fracture. No other fracture or dislocation. Right knee arthroplasty. Peripheral vascular atherosclerotic disease. IMPRESSION: 1. Minimally displaced acute right intertrochanteric hip fracture. Electronically Signed   By: Kathreen Devoid   On: 11/01/2015 16:53    Review of Systems  Respiratory: Negative for shortness of breath and wheezing.   Cardiovascular: Negative for chest pain,  palpitations and orthopnea.  Musculoskeletal: Positive for falls and joint pain.  All other systems reviewed and are negative.  Blood pressure 123/60, pulse 72, temperature 98.8 F (37.1 C), temperature source Oral, resp. rate 17, height '5\' 5"'$  (1.651 m), weight 138 lb 1.6 oz (62.6 kg), SpO2 93 %. Physical Exam  Constitutional: She is oriented to person, place, and time. She appears well-nourished.  Eyes: Right eye exhibits no discharge. Left eye exhibits no discharge. No scleral icterus.  Neck: Neck supple. No JVD present. No tracheal deviation present.  Cardiovascular: Intact distal pulses.   Respiratory: Effort normal. No stridor.  GI: Soft. She exhibits no distension. There is no hepatosplenomegaly.  Neurological: She is alert and oriented to person, place, and time. She has normal reflexes. She exhibits normal muscle tone. Coordination normal.  Skin: Skin is warm and dry. No rash noted. No erythema. No pallor.  Psychiatric: She has a normal mood and affect. Her behavior is normal. Thought content normal.    right and left upper extremities normal ROM, stability, strength, alignment no tenderness  Left lower extremity normal ROM, stability, strength, alignment no tenderness   Right lower extremity externally rotated, mild swelling proximal thigh tenderness in the trochanteric region and anterior hip with normal muscle tone. Knee incision healed from total knee. No evidence of knee dislocation or ankle  dislocation Normal sensation in the right leg, 2+ dorsalis pedis pulses normal color and temperature right lower extremity  Assessment/Plan: 82 year old female with a right intertrochanteric fracture requires internal fixation. Cardiology consult Stop eloquis x 48 hrs last dose thurs at 830 am  Surgery Sat morning   Arther Abbott 11/02/2015, 7:59 AM

## 2015-11-02 NOTE — Care Management Note (Signed)
Case Management Note  Patient Details  Name: Kathleen [name] MRN: [id number] Date of Birth: 1924-01-27    Expected Discharge Date:       11/05/2015           Expected Discharge Plan:  Skilled Nursing Facility  In-House Referral:  Clinical Social Work  Discharge planning Services  CM Consult  Post Acute Care Choice:    Choice offered to:     DME Arranged:    DME Agency:     HH Arranged:    HH Agency:     Status of Service:  In process, will continue to follow  If discussed at Long Length of Stay Meetings, dates discussed:    Additional Comments: Patient adm with hip fracture, surgery scheduled for tomorrow. Likely will need SNF at discharge, CSW aware. Will follow for needs.   Aarvi Stotts, Chrystine Oiler, RN 11/02/2015, 1:05 PM

## 2015-11-02 NOTE — Progress Notes (Addendum)
ANTICOAGULATION CONSULT NOTE - Initial Consult  Pharmacy Consult for Heparin  Indication: atrial fibrillation  Allergies  Allergen Reactions  . Coumadin [Warfarin Sodium] Other (See Comments)    Caused Patient to Bleed Out.   . Meloxicam Other (See Comments)    Unknown  . Metformin And Related Other (See Comments)    Cannot have due to kidney function  . Morphine Itching    Not in right state of mind.   . Nabumetone Nausea And Vomiting  . Oxycodone Nausea And Vomiting  . Sulfonamide Derivatives Hives    Patient Measurements: Height: 5\' 5"  (165.1 cm) Weight: 138 lb 1.6 oz (62.6 kg) IBW/kg (Calculated) : 57 HEPARIN DW (KG): 62.1  Vital Signs: Temp: 98.8 F (37.1 C) (10/20 0558) Temp Source: Oral (10/20 0558) BP: 123/60 (10/20 0558) Pulse Rate: 72 (10/20 0558)  Labs:  Recent Labs  11/01/15 1658 11/02/15 0557 11/02/15 0750  HGB 11.5* 10.3*  --   HCT 35.3* 31.4*  --   PLT 263 231  --   LABPROT 18.8*  --  19.1*  INR 1.56  --  1.59  CREATININE 1.24* 1.29*  --     Estimated Creatinine Clearance: 27.1 mL/min (by C-G formula based on SCr of 1.29 mg/dL (H)).   Medical History: Past Medical History:  Diagnosis Date  . Acid reflux   . 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 CAD (coronary artery disease)   . Chronic kidney disease (CKD), stage III (moderate)   . Essential hypertension   . History of pneumonia   . Hyperlipidemia   . Nonischemic cardiomyopathy (HCC)   . PAF (paroxysmal atrial fibrillation) (HCC)   . Type 2 diabetes mellitus (HCC)     Medications:  Prescriptions Prior to Admission  Medication Sig Dispense Refill Last Dose  . amiodarone (PACERONE) 200 MG tablet Take 0.5 tablets (100 mg total) by mouth daily. 30 tablet 10 11/01/2015 at Unknown time  . Coenzyme Q10 (CO Q 10 PO) Take 1 tablet by mouth daily.   11/01/2015 at Unknown time  . ELIQUIS 2.5 MG TABS tablet TAKE ONE TABLET BY MOUTH TWICE DAILY. 60 tablet 5 11/01/2015 at  830a  . furosemide (LASIX) 40 MG tablet Take 40 mg by mouth daily. 3 lbs weight gain in 24 hours extra half tablet ( 20 mg )   11/01/2015 at Unknown time  . glimepiride (AMARYL) 1 MG tablet Take 0.5 mg by mouth as needed (for blood sugar levels over 120).    10/30/2015 at Unknown time  . HYDROcodone-acetaminophen (NORCO/VICODIN) 5-325 MG tablet Take 0.5-1 tablets by mouth every 4 (four) hours as needed for severe pain. 30 tablet 0 Past Week at Unknown time  . lubiprostone (AMITIZA) 24 MCG capsule Take 24 mcg by mouth 2 (two) times daily with a meal.   11/01/2015 at Unknown time  . meclizine (ANTIVERT) 25 MG tablet Take 25 mg by mouth 3 (three) times daily as needed for dizziness.   unknown  . metolazone (ZAROXOLYN) 2.5 MG tablet Take 2.5 mg by mouth daily. If patients weighs over 140 take 1 tablet (2.5 mg ) with extra half of lasix (20 mg )   unknown  . metoprolol succinate (TOPROL-XL) 25 MG 24 hr tablet TAKE 3 TABLETS BY MOUTH DAILY. 90 tablet 8 11/01/2015 at 830a  . Multiple Vitamins-Minerals (CENTRUM SILVER PO) Take 1 tablet by mouth every morning.    11/01/2015 at Unknown time  . Multiple Vitamins-Minerals (ICAPS AREDS 2)  CAPS Take 1 capsule by mouth 2 (two) times daily.   11/01/2015 at Unknown time  . nitroGLYCERIN (NITROSTAT) 0.4 MG SL tablet DISSOLVE 1 TABLET UNDER TONGUE EVERY 5 MINUTES UP TO 15 MIN FOR CHESTPAIN. IF NO RELIEF CALL 911. 25 tablet 4 Past Week at Unknown time  . omeprazole (PRILOSEC) 20 MG capsule TAKE ONE CAPSULE BY MOUTH DAILY. 30 capsule 6 11/01/2015 at Unknown time  . penicillin v potassium (VEETID) 500 MG tablet Take 500 mg by mouth once. Prior to dental procedure   10/30/2015 at Unknown time  . potassium chloride (KLOR-CON) 8 MEQ tablet Take 1 tablet (8 mEq total) by mouth 3 (three) times daily. 270 tablet 3 11/01/2015 at mormimg  . pravastatin (PRAVACHOL) 40 MG tablet TAKE 1 TABLET BY MOUTH EACH EVENING. 30 tablet 10 10/31/2015 at Unknown time  . ondansetron (ZOFRAN) 4 MG  tablet Take 4 mg by mouth every 4 (four) hours as needed for nausea or vomiting.   unknown    Assessment: 82 y.o.female with known history of long-standing nonischemic cardiomyopathy, biventricular pacemaker in situ, paroxysmal atrial fibrillation on amiodarone and ELIQUIS therapy, CHADS VASC Score of 7. The patient was admitted after sustaining a right intertrochanteric hip fracture after falling at home. Heparin coverage until surgery is being implemented. Patients last dose of eliquis 10/19 at 0830.   Goal of Therapy:  APTT 66-102 Heparin level 0.3-0.7 units/ml Monitor platelets by anticoagulation protocol: Yes   Plan:  Give 3500 units bolus x 1 Start heparin infusion at 900 units/hr Baseline anti-Xa (heparin level) and aPTT since on Eliquis Check anti-Xa leve and aPTTl in 8 hours and daily while on heparin Continue to monitor H&H and platelets  Elder Cyphers, BS Loura Back, BCPS Clinical Pharmacist Pager [phone]  11/02/2015,8:50 AM

## 2015-11-03 ENCOUNTER — Inpatient Hospital Stay (HOSPITAL_COMMUNITY): Payer: PPO

## 2015-11-03 ENCOUNTER — Encounter (HOSPITAL_COMMUNITY): Payer: Self-pay | Admitting: Anesthesiology

## 2015-11-03 ENCOUNTER — Inpatient Hospital Stay (HOSPITAL_COMMUNITY): Payer: PPO | Admitting: Anesthesiology

## 2015-11-03 ENCOUNTER — Encounter (HOSPITAL_COMMUNITY)
Admission: EM | Disposition: A | Discharge status: Skilled Nursing Facility | Payer: Self-pay | Source: Home / Self Care | Attending: Family Medicine

## 2015-11-03 DIAGNOSIS — E7800 Pure hypercholesterolemia, unspecified: Secondary | ICD-10-CM

## 2015-11-03 DIAGNOSIS — S72141A Displaced intertrochanteric fracture of right femur, initial encounter for closed fracture: Secondary | ICD-10-CM

## 2015-11-03 DIAGNOSIS — I10 Essential (primary) hypertension: Secondary | ICD-10-CM

## 2015-11-03 DIAGNOSIS — E440 Moderate protein-calorie malnutrition: Secondary | ICD-10-CM

## 2015-11-03 HISTORY — PX: INTRAMEDULLARY (IM) NAIL INTERTROCHANTERIC: SHX5875

## 2015-11-03 LAB — GLUCOSE, CAPILLARY
GLUCOSE-CAPILLARY: 222 mg/dL — AB (ref 65–99)
GLUCOSE-CAPILLARY: 192 mg/dL — AB (ref 65–99)
GLUCOSE-CAPILLARY: 247 mg/dL — AB (ref 65–99)
Glucose-Capillary: 189 mg/dL — ABNORMAL HIGH (ref 65–99)
Glucose-Capillary: 194 mg/dL — ABNORMAL HIGH (ref 65–99)

## 2015-11-03 LAB — HEMOGLOBIN A1C
Hgb A1c MFr Bld: 7.3 % — ABNORMAL HIGH (ref 4.8–5.6)
Mean Plasma Glucose: 163 mg/dL

## 2015-11-03 LAB — ABO/RH: ABO/RH(D): A NEG

## 2015-11-03 SURGERY — FIXATION, FRACTURE, INTERTROCHANTERIC, WITH INTRAMEDULLARY ROD
Anesthesia: General | Site: Leg Upper | Laterality: Right

## 2015-11-03 SURGERY — FIXATION, FRACTURE, INTERTROCHANTERIC, WITH INTRAMEDULLARY ROD
Anesthesia: General | Laterality: Right

## 2015-11-03 MED ORDER — ALBUMIN HUMAN 5 % IV SOLN
INTRAVENOUS | Status: DC | PRN
  Administered 2015-11-03: 08:00:00 via INTRAVENOUS

## 2015-11-03 MED ORDER — CEFAZOLIN SODIUM-DEXTROSE 2-4 GM/100ML-% IV SOLN
INTRAVENOUS | Status: AC
  Filled 2015-11-03: qty 100

## 2015-11-03 MED ORDER — ONDANSETRON HCL 4 MG/2ML IJ SOLN: 4.0000 mg | Freq: Once | INTRAMUSCULAR | Status: DC | PRN

## 2015-11-03 MED ORDER — WARFARIN SODIUM 5 MG PO TABS: 5.0000 mg | ORAL_TABLET | Freq: Every day | ORAL | 0 refills | Status: DC

## 2015-11-03 MED ORDER — LACTATED RINGERS IV SOLN
INTRAVENOUS | Status: DC
  Administered 2015-11-03: 07:00:00 via INTRAVENOUS

## 2015-11-03 MED ORDER — ONDANSETRON HCL 4 MG/2ML IJ SOLN
INTRAMUSCULAR | Status: DC | PRN
  Administered 2015-11-03: 4 mg via INTRAVENOUS

## 2015-11-03 MED ORDER — PHENOL 1.4 % MT LIQD: 1.0000 | OROMUCOSAL | Status: DC | PRN

## 2015-11-03 MED ORDER — APIXABAN 2.5 MG PO TABS
2.5000 mg | ORAL_TABLET | Freq: Two times a day (BID) | ORAL | Status: DC
  Administered 2015-11-03 – 2015-11-07 (×8): 2.5 mg via ORAL
  Filled 2015-11-03 (×8): qty 1

## 2015-11-03 MED ORDER — LIDOCAINE HCL (CARDIAC) 20 MG/ML IV SOLN
INTRAVENOUS | Status: DC | PRN
  Administered 2015-11-03: 100 mg via INTRAVENOUS

## 2015-11-03 MED ORDER — PHENYLEPHRINE HCL 10 MG/ML IJ SOLN
INTRAVENOUS | Status: DC | PRN
  Administered 2015-11-03: 50 ug/min via INTRAVENOUS

## 2015-11-03 MED ORDER — MEPERIDINE HCL 25 MG/ML IJ SOLN: 6.2500 mg | INTRAMUSCULAR | Status: DC | PRN

## 2015-11-03 MED ORDER — 0.9 % SODIUM CHLORIDE (POUR BTL) OPTIME
TOPICAL | Status: DC | PRN
  Administered 2015-11-03: 1000 mL

## 2015-11-03 MED ORDER — MENTHOL 3 MG MT LOZG: 1.0000 | LOZENGE | OROMUCOSAL | Status: DC | PRN

## 2015-11-03 MED ORDER — ROCURONIUM BROMIDE 100 MG/10ML IV SOLN
INTRAVENOUS | Status: DC | PRN
  Administered 2015-11-03: 40 mg via INTRAVENOUS

## 2015-11-03 MED ORDER — SUGAMMADEX SODIUM 200 MG/2ML IV SOLN
INTRAVENOUS | Status: DC | PRN
  Administered 2015-11-03: 200 mg via INTRAVENOUS

## 2015-11-03 MED ORDER — POVIDONE-IODINE 10 % EX SWAB: 2.0000 "application " | Freq: Once | CUTANEOUS | Status: DC

## 2015-11-03 MED ORDER — ONDANSETRON HCL 4 MG/2ML IJ SOLN
INTRAMUSCULAR | Status: AC
  Filled 2015-11-03: qty 2

## 2015-11-03 MED ORDER — ACETAMINOPHEN 650 MG RE SUPP
650.0000 mg | Freq: Four times a day (QID) | RECTAL | Status: DC | PRN
Start: 2015-11-03 — End: 2015-11-07

## 2015-11-03 MED ORDER — MUPIROCIN 2 % EX OINT
1.0000 "application " | TOPICAL_OINTMENT | Freq: Two times a day (BID) | CUTANEOUS | Status: DC
  Administered 2015-11-03 – 2015-11-07 (×9): 1 via NASAL
  Filled 2015-11-03: qty 22

## 2015-11-03 MED ORDER — POVIDONE-IODINE 10 % EX SWAB
2.0000 "application " | Freq: Once | CUTANEOUS | Status: AC
  Administered 2015-11-03: 2 via TOPICAL

## 2015-11-03 MED ORDER — METOCLOPRAMIDE HCL 5 MG PO TABS: 5.0000 mg | ORAL_TABLET | Freq: Three times a day (TID) | ORAL | Status: DC | PRN

## 2015-11-03 MED ORDER — HYDROCODONE-ACETAMINOPHEN 5-325 MG PO TABS
1.0000 | ORAL_TABLET | Freq: Four times a day (QID) | ORAL | Status: DC | PRN
  Administered 2015-11-04: 2 via ORAL
  Administered 2015-11-04 – 2015-11-06 (×6): 1 via ORAL
  Administered 2015-11-06 – 2015-11-07 (×2): 2 via ORAL
  Filled 2015-11-03 (×2): qty 1
  Filled 2015-11-03 (×2): qty 2
  Filled 2015-11-03: qty 1
  Filled 2015-11-03: qty 2
  Filled 2015-11-03 (×3): qty 1

## 2015-11-03 MED ORDER — CHLORHEXIDINE GLUCONATE 4 % EX LIQD: 60.0000 mL | Freq: Once | CUTANEOUS | Status: DC

## 2015-11-03 MED ORDER — CEFAZOLIN SODIUM-DEXTROSE 2-4 GM/100ML-% IV SOLN: 2.0000 g | Freq: Four times a day (QID) | INTRAVENOUS | Status: DC

## 2015-11-03 MED ORDER — METOCLOPRAMIDE HCL 5 MG/ML IJ SOLN: 5.0000 mg | Freq: Three times a day (TID) | INTRAMUSCULAR | Status: DC | PRN

## 2015-11-03 MED ORDER — METHOCARBAMOL 500 MG PO TABS
500.0000 mg | ORAL_TABLET | Freq: Four times a day (QID) | ORAL | Status: DC | PRN
  Administered 2015-11-04 – 2015-11-05 (×2): 500 mg via ORAL
  Filled 2015-11-03 (×2): qty 1

## 2015-11-03 MED ORDER — CEFAZOLIN SODIUM-DEXTROSE 2-4 GM/100ML-% IV SOLN
2.0000 g | INTRAVENOUS | Status: AC
Start: 2015-11-03 — End: 2015-11-03
  Administered 2015-11-03: 2 g via INTRAVENOUS
  Filled 2015-11-03: qty 100

## 2015-11-03 MED ORDER — PROPOFOL 10 MG/ML IV BOLUS
INTRAVENOUS | Status: AC
  Filled 2015-11-03: qty 20

## 2015-11-03 MED ORDER — LIDOCAINE 2% (20 MG/ML) 5 ML SYRINGE
INTRAMUSCULAR | Status: AC
  Filled 2015-11-03: qty 5

## 2015-11-03 MED ORDER — ONDANSETRON HCL 4 MG/2ML IJ SOLN: 4.0000 mg | Freq: Four times a day (QID) | INTRAMUSCULAR | Status: DC | PRN

## 2015-11-03 MED ORDER — CEFAZOLIN SODIUM-DEXTROSE 2-4 GM/100ML-% IV SOLN
2.0000 g | INTRAVENOUS | Status: AC
  Administered 2015-11-03: 2 g via INTRAVENOUS

## 2015-11-03 MED ORDER — SODIUM CHLORIDE 0.9 % IV SOLN
INTRAVENOUS | Status: DC
  Administered 2015-11-03: 10:00:00 via INTRAVENOUS

## 2015-11-03 MED ORDER — CEFAZOLIN IN D5W 1 GM/50ML IV SOLN
1.0000 g | Freq: Two times a day (BID) | INTRAVENOUS | Status: AC
  Administered 2015-11-03: 1 g via INTRAVENOUS
  Filled 2015-11-03: qty 50

## 2015-11-03 MED ORDER — ROCURONIUM BROMIDE 10 MG/ML (PF) SYRINGE
PREFILLED_SYRINGE | INTRAVENOUS | Status: AC
  Filled 2015-11-03: qty 10

## 2015-11-03 MED ORDER — ACETAMINOPHEN 325 MG PO TABS
650.0000 mg | ORAL_TABLET | Freq: Four times a day (QID) | ORAL | Status: DC | PRN
  Administered 2015-11-03: 650 mg via ORAL
  Administered 2015-11-07: 325 mg via ORAL
  Filled 2015-11-03: qty 2

## 2015-11-03 MED ORDER — ALUM & MAG HYDROXIDE-SIMETH 200-200-20 MG/5ML PO SUSP: 30.0000 mL | ORAL | Status: DC | PRN

## 2015-11-03 MED ORDER — FENTANYL CITRATE (PF) 100 MCG/2ML IJ SOLN
INTRAMUSCULAR | Status: AC
  Filled 2015-11-03: qty 2

## 2015-11-03 MED ORDER — CHLORHEXIDINE GLUCONATE CLOTH 2 % EX PADS
6.0000 | MEDICATED_PAD | Freq: Every day | CUTANEOUS | Status: DC
  Administered 2015-11-05 – 2015-11-07 (×3): 6 via TOPICAL

## 2015-11-03 MED ORDER — HYDROCODONE-ACETAMINOPHEN 7.5-325 MG PO TABS: 1.0000 | ORAL_TABLET | Freq: Four times a day (QID) | ORAL | 0 refills | Status: DC | PRN

## 2015-11-03 MED ORDER — FENTANYL CITRATE (PF) 100 MCG/2ML IJ SOLN
INTRAMUSCULAR | Status: DC | PRN
  Administered 2015-11-03: 100 ug via INTRAVENOUS
  Administered 2015-11-03: 50 ug via INTRAVENOUS

## 2015-11-03 MED ORDER — ONDANSETRON HCL 4 MG PO TABS: 4.0000 mg | ORAL_TABLET | Freq: Four times a day (QID) | ORAL | Status: DC | PRN

## 2015-11-03 MED ORDER — PROPOFOL 10 MG/ML IV BOLUS
INTRAVENOUS | Status: DC | PRN
Start: 2015-11-03 — End: 2015-11-03
  Administered 2015-11-03: 120 mg via INTRAVENOUS

## 2015-11-03 MED ORDER — METHOCARBAMOL 1000 MG/10ML IJ SOLN
500.0000 mg | Freq: Four times a day (QID) | INTRAVENOUS | Status: DC | PRN
  Filled 2015-11-03: qty 5

## 2015-11-03 MED ORDER — HYDROMORPHONE HCL 1 MG/ML IJ SOLN: 0.2500 mg | INTRAMUSCULAR | Status: DC | PRN

## 2015-11-03 SURGICAL SUPPLY — 44 items
BLADE SURG 15 STRL LF DISP TIS (BLADE) ×1 IMPLANT
BLADE SURG 15 STRL SS (BLADE) ×2
BNDG COHESIVE 4X5 TAN NS LF (GAUZE/BANDAGES/DRESSINGS) ×3 IMPLANT
BNDG COHESIVE 6X5 TAN STRL LF (GAUZE/BANDAGES/DRESSINGS) ×9 IMPLANT
BNDG GAUZE ELAST 4 BULKY (GAUZE/BANDAGES/DRESSINGS) ×3 IMPLANT
COVER PERINEAL POST (MISCELLANEOUS) ×3 IMPLANT
COVER SURGICAL LIGHT HANDLE (MISCELLANEOUS) ×3 IMPLANT
DRAPE PROXIMA HALF (DRAPES) IMPLANT
DRAPE STERI IOBAN 125X83 (DRAPES) ×3 IMPLANT
DRSG MEPILEX BORDER 4X4 (GAUZE/BANDAGES/DRESSINGS) ×3 IMPLANT
DRSG MEPILEX BORDER 4X8 (GAUZE/BANDAGES/DRESSINGS) ×3 IMPLANT
DRSG PAD ABDOMINAL 8X10 ST (GAUZE/BANDAGES/DRESSINGS) ×3 IMPLANT
DURAPREP 26ML APPLICATOR (WOUND CARE) ×3 IMPLANT
ELECT CAUTERY BLADE 6.4 (BLADE) ×3 IMPLANT
ELECT REM PT RETURN 9FT ADLT (ELECTROSURGICAL) ×3
ELECTRODE REM PT RTRN 9FT ADLT (ELECTROSURGICAL) ×1 IMPLANT
FACESHIELD WRAPAROUND (MASK) ×3 IMPLANT
GAUZE XEROFORM 1X8 LF (GAUZE/BANDAGES/DRESSINGS) ×3 IMPLANT
GAUZE XEROFORM 5X9 LF (GAUZE/BANDAGES/DRESSINGS) ×3 IMPLANT
GLOVE SKINSENSE NS SZ7.5 (GLOVE) ×4
GLOVE SKINSENSE STRL SZ7.5 (GLOVE) ×2 IMPLANT
GOWN STRL REIN XL XLG (GOWN DISPOSABLE) ×3 IMPLANT
GUIDE PIN 3.2X343 (PIN) ×1
GUIDE PIN 3.2X343MM (PIN) ×2
KIT BASIN OR (CUSTOM PROCEDURE TRAY) ×3 IMPLANT
KIT ROOM TURNOVER OR (KITS) ×3 IMPLANT
LINER BOOT UNIVERSAL DISP (MISCELLANEOUS) ×3 IMPLANT
MANIFOLD NEPTUNE II (INSTRUMENTS) ×3 IMPLANT
NAIL TRIGEN INTERTAN RT 10X40 (Nail) ×3 IMPLANT
NS IRRIG 1000ML POUR BTL (IV SOLUTION) ×3 IMPLANT
PACK GENERAL/GYN (CUSTOM PROCEDURE TRAY) ×3 IMPLANT
PAD ARMBOARD 7.5X6 YLW CONV (MISCELLANEOUS) ×6 IMPLANT
PAD CAST 4YDX4 CTTN HI CHSV (CAST SUPPLIES) ×2 IMPLANT
PADDING CAST COTTON 4X4 STRL (CAST SUPPLIES) ×4
PIN GUIDE 3.2X343MM (PIN) ×1 IMPLANT
SCREW LAG COMPR KIT 105/100 (Screw) ×3 IMPLANT
STAPLER VISISTAT 35W (STAPLE) ×6 IMPLANT
SUT VIC AB 0 CT1 27 (SUTURE) ×4
SUT VIC AB 0 CT1 27XBRD ANBCTR (SUTURE) ×2 IMPLANT
SUT VIC AB 2-0 CT1 27 (SUTURE) ×4
SUT VIC AB 2-0 CT1 TAPERPNT 27 (SUTURE) ×2 IMPLANT
TOWEL OR 17X24 6PK STRL BLUE (TOWEL DISPOSABLE) ×3 IMPLANT
TOWEL OR 17X26 10 PK STRL BLUE (TOWEL DISPOSABLE) ×3 IMPLANT
WATER STERILE IRR 1000ML POUR (IV SOLUTION) ×3 IMPLANT

## 2015-11-03 SURGICAL SUPPLY — 49 items
BAG HAMPER (MISCELLANEOUS) IMPLANT
BIT DRILL AO GAMMA 4.2X300 (BIT) IMPLANT
BLADE HEX COATED 2.75 (ELECTRODE) IMPLANT
BLADE SURG SZ10 CARB STEEL (BLADE) ×3 IMPLANT
BNDG GAUZE ELAST 4 BULKY (GAUZE/BANDAGES/DRESSINGS) IMPLANT
CHLORAPREP W/TINT 26ML (MISCELLANEOUS) IMPLANT
CLOTH BEACON ORANGE TIMEOUT ST (SAFETY) IMPLANT
COVER LIGHT HANDLE STERIS (MISCELLANEOUS) ×3 IMPLANT
COVER MAYO STAND XLG (DRAPE) IMPLANT
DECANTER SPIKE VIAL GLASS SM (MISCELLANEOUS) ×3 IMPLANT
DRAPE STERI IOBAN 125X83 (DRAPES) IMPLANT
DRSG AQUACEL AG ADV 3.5X10 (GAUZE/BANDAGES/DRESSINGS) IMPLANT
DRSG MEPILEX BORDER 4X12 (GAUZE/BANDAGES/DRESSINGS) IMPLANT
ELECT REM PT RETURN 9FT ADLT (ELECTROSURGICAL)
ELECTRODE REM PT RTRN 9FT ADLT (ELECTROSURGICAL) IMPLANT
GLOVE BIOGEL PI IND STRL 7.0 (GLOVE) IMPLANT
GLOVE BIOGEL PI INDICATOR 7.0 (GLOVE)
GLOVE SKINSENSE NS SZ8.0 LF (GLOVE)
GLOVE SKINSENSE STRL SZ8.0 LF (GLOVE) IMPLANT
GLOVE SS N UNI LF 8.5 STRL (GLOVE) IMPLANT
GOWN STRL REUS W/TWL LRG LVL3 (GOWN DISPOSABLE) IMPLANT
GOWN STRL REUS W/TWL XL LVL3 (GOWN DISPOSABLE) IMPLANT
GUIDEROD T2 3X1000 (ROD) IMPLANT
INST SET MAJOR BONE (KITS) IMPLANT
K-WIRE  3.2X450M STR (WIRE)
K-WIRE 3.2X450M STR (WIRE)
KIT BLADEGUARD II DBL (SET/KITS/TRAYS/PACK) IMPLANT
KIT ROOM TURNOVER AP CYSTO (KITS) IMPLANT
KWIRE 3.2X450M STR (WIRE) IMPLANT
MANIFOLD NEPTUNE II (INSTRUMENTS) IMPLANT
MARKER SKIN DUAL TIP RULER LAB (MISCELLANEOUS) IMPLANT
NEEDLE HYPO 21X1.5 SAFETY (NEEDLE) IMPLANT
NEEDLE SPNL 18GX3.5 QUINCKE PK (NEEDLE) IMPLANT
NS IRRIG 1000ML POUR BTL (IV SOLUTION) IMPLANT
PACK BASIC III (CUSTOM PROCEDURE TRAY)
PACK SRG BSC III STRL LF ECLPS (CUSTOM PROCEDURE TRAY) IMPLANT
PENCIL HANDSWITCHING (ELECTRODE) IMPLANT
SET BASIN LINEN APH (SET/KITS/TRAYS/PACK) IMPLANT
SLING ARM FOAM STRAP LRG (SOFTGOODS) IMPLANT
SLING ARM FOAM STRAP MED (SOFTGOODS) IMPLANT
SLING ARM FOAM STRAP XLG (SOFTGOODS) IMPLANT
SPONGE LAP 18X18 X RAY DECT (DISPOSABLE) ×3 IMPLANT
STAPLER VISISTAT 35W (STAPLE) IMPLANT
SUT MNCRL 0 VIOLET CTX 36 (SUTURE) IMPLANT
SUT MON AB 2-0 CT1 36 (SUTURE) IMPLANT
SUT MONOCRYL 0 CTX 36 (SUTURE)
SYR 30ML LL (SYRINGE) IMPLANT
SYR BULB IRRIGATION 50ML (SYRINGE) ×3 IMPLANT
YANKAUER SUCT BULB TIP NO VENT (SUCTIONS) IMPLANT

## 2015-11-03 NOTE — Op Note (Signed)
   Date of Surgery: 11/03/2015  INDICATIONS: Ms. Berto is a 82 y.o.-year-old female who sustained a right hip fracture. The risks and benefits of the procedure discussed with the patient prior to the procedure and all questions were answered; consent was obtained.  PREOPERATIVE DIAGNOSIS: right hip fracture   POSTOPERATIVE DIAGNOSIS: Same   PROCEDURE: Treatment of intertrochanteric fracture with intramedullary implant. CPT [id number]   SURGEON: N. Glee Arvin, M.D.   ANESTHESIA: general   IV FLUIDS AND URINE: See anesthesia record   ESTIMATED BLOOD LOSS: 300 cc  IMPLANTS: [employer] InterTAN 10 x 40, 100/95  DRAINS: None.   COMPLICATIONS: None.   DESCRIPTION OF PROCEDURE: The patient was brought to the operating room and placed supine on the operating table. The patient's leg had been signed prior to the procedure. The patient had the anesthesia placed by the anesthesiologist. The prep verification and incision time-outs were performed to confirm that this was the correct patient, site, side and location. The patient had an SCD on the opposite lower extremity. The patient did receive antibiotics prior to the incision and was re-dosed during the procedure as needed at indicated intervals. The patient was positioned on the fracture table with the table in traction and internal rotation to reduce the hip. The well leg was placed in a scissor position and all bony prominences were well-padded. The patient had the lower extremity prepped and draped in the standard surgical fashion. The incision was made 4 finger breadths superior to the greater trochanter. A guide pin was inserted into the tip of the greater trochanter under fluoroscopic guidance. An opening reamer was used to gain access to the femoral canal. The nail length was measured and inserted down the femoral canal to its proper depth. The appropriate version of insertion for the lag screw was found under fluoroscopy. A pin was  inserted up the femoral neck through the jig. Then, a second antirotation pin was inserted inferior to the first pin. The length of the lag screw was then measured. The lag screw was inserted as near to center-center in the head as possible. The antirotation pin was then taken out and an interdigitating compression screw was placed in its place. The leg was taken out of traction, then the interdigitating compression screw was used to compress across the fracture. Compression was visualized on serial xrays. The wound was copiously irrigated with saline and the subcutaneous layer closed with 2.0 vicryl and the skin was reapproximated with staples. The wounds were cleaned and dried a final time and a sterile dressing was placed. The hip was taken through a range of motion at the end of the case under fluoroscopic imaging to visualize the approach-withdraw phenomenon and confirm implant length in the head. The patient was then awakened from anesthesia and taken to the recovery room in stable condition. All counts were correct at the end of the case.   POSTOPERATIVE PLAN: The patient will be weight bearing as tolerated and will return in 2 weeks for staple removal and the patient will receive DVT prophylaxis based on other medications, activity level, and risk ratio of bleeding to thrombosis.   Mayra Reel, MD [facility] [phone] 8:37 AM

## 2015-11-03 NOTE — Progress Notes (Signed)
PROGRESS NOTE    Kathleen [name]  ZOX:[id number] DOB: Apr 07, 1924 DOA: 11/01/2015 PCP: [name]., MD   Brief Narrative:  Kathleen [name] is a 82 y.o. 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 She denied LOC, palpitation, but complained of right hip pain. Xray confirmed mild displaced right intertroch Fx, EKG showed paced rhythm, CXR showed unusual pattern, but no infiltrate, Cr of 1.2, Hb of 11.5 g per dL, and INR of 1.5. EDP consulted Dr Romeo Apple, and hospitalist was asked to admit her for further Tx.    Assessment & Plan:   Principal Problem:   Hip fracture (HCC) Active Problems:   Hypertension   DM type 2 (diabetes mellitus, type 2) (HCC)   Hyperlipidemia   Coronary atherosclerosis, no significant obstructive lesions   Closed displaced intertrochanteric fracture of right femur (HCC)   Malnutrition of moderate degree  Right hip fracture Patient is s/p surgery on 10/21. Doing well but tired -PT/OT  Diabetes mellitus Glimepiride at home -SSI  Chronic combined systolic and diastolic heart failure Patient takes lasix, metoprolol, metolazone prn weight >140lbs. Last EF 40%. Currently euvolemic -continue metoprolol -continue furosemide -daily weight -in/out  Atrial fibrillation Takes amiodarone, metoprolol and Eliquis as outpatient -continue home meds  Hypertention -continue metoprolol, amiodarone  Coronary atherosclerosis Hyperlipidemia -continue pravastatin   DVT prophylaxis: Eliquis Code Status: DNR Family Communication: Daughter at bedside Disposition Plan: Discharge to SNF in 1-2 days   Consultants:   Orthopedic surgery  Procedures:   Right hip pin placement (10/21)  Antimicrobials:  None    Subjective: Patient reports no pain at time of interview. Mild pain intermittently.  Objective: Vitals:   11/03/15 1054 11/03/15 1055 11/03/15 1100 11/03/15 1300  BP: (!) 106/53  (!) 92/46 (!) 105/49  Pulse: 70  70 72  Resp: 19     Temp:  97.5 F (36.4 C) 97.8 F (36.6 C) 97.8 F (36.6 C)  TempSrc:   Axillary Oral  SpO2: 100%  90% 99%  Weight:      Height:        Intake/Output Summary (Last 24 hours) at 11/03/15 1552 Last data filed at 11/03/15 0855  Gross per 24 hour  Intake              650 ml  Output              675 ml  Net              -25 ml   Filed Weights   11/01/15 1550 11/01/15 2030  Weight: 62.1 kg (137 lb) 62.6 kg (138 lb 1.6 oz)    Examination:  General exam: Appears calm and comfortable Respiratory system: Clear to auscultation. Respiratory effort normal. Cardiovascular system: S1 & S2 heard, RRR. No murmurs, rubs, gallops or clicks. Gastrointestinal system: Abdomen is nondistended, soft and nontender. No organomegaly or masses felt. Normal bowel sounds heard. Central nervous system: Alert and oriented. No focal neurological deficits. Extremities: No edema. No calf tenderness Skin: No cyanosis. No rashes Psychiatry: Judgement and insight appear normal. Mood & affect appropriate.     Data Reviewed: I have personally reviewed following labs and imaging studies  CBC:  Recent Labs Lab 11/01/15 1658 11/02/15 0557  WBC 11.8* 9.5  NEUTROABS 10.1*  --   HGB 11.5* 10.3*  HCT 35.3* 31.4*  MCV 98.3 98.4  PLT 263 231   Basic Metabolic Panel:  Recent Labs Lab 11/01/15 1658 11/02/15 0557  NA 132* 133*  K 4.1 4.4  CL 96* 99*  CO2 28 27  GLUCOSE 180* 214*  BUN 25* 25*  CREATININE 1.24* 1.29*  CALCIUM 8.6* 8.3*   GFR: Estimated Creatinine Clearance: 27.1 mL/min (by C-G formula based on SCr of 1.29 mg/dL (H)). Liver Function Tests:  Recent Labs Lab 11/01/15 1658  AST 24  ALT 22  ALKPHOS 120  BILITOT 0.8  PROT 6.7  ALBUMIN 3.4*   No results for input(s): LIPASE, AMYLASE in the last 168 hours. No results for input(s): AMMONIA in the last 168  hours. Coagulation Profile:  Recent Labs Lab 11/01/15 1658 11/02/15 0750  INR 1.56 1.59   Cardiac Enzymes: No results for input(s): CKTOTAL, CKMB, CKMBINDEX, TROPONINI in the last 168 hours. BNP (last 3 results) No results for input(s): PROBNP in the last 8760 hours. HbA1C:  Recent Labs  11/02/15 0557  HGBA1C 7.3*   CBG:  Recent Labs Lab 11/02/15 1651 11/02/15 2151 11/03/15 0621 11/03/15 0906 11/03/15 1153  GLUCAP 238* 236* 222* 189* 192*   Lipid Profile: No results for input(s): CHOL, HDL, LDLCALC, TRIG, CHOLHDL, LDLDIRECT in the last 72 hours. Thyroid Function Tests: No results for input(s): TSH, T4TOTAL, FREET4, T3FREE, THYROIDAB in the last 72 hours. Anemia Panel: No results for input(s): VITAMINB12, FOLATE, FERRITIN, TIBC, IRON, RETICCTPCT in the last 72 hours. Sepsis Labs: No results for input(s): PROCALCITON, LATICACIDVEN in the last 168 hours.  Recent Results (from the past 240 hour(s))  Urine culture     Status: Abnormal (Preliminary result)   Collection Time: 11/01/15  5:28 PM  Result Value Ref Range Status   Specimen Description URINE, CATHETERIZED  Final   Special Requests Normal  Final   Culture >=100,000 COLONIES/mL KLEBSIELLA PNEUMONIAE (A)  Final   Report Status PENDING  Incomplete  Surgical pcr screen     Status: Abnormal   Collection Time: 11/02/15  2:25 PM  Result Value Ref Range Status   MRSA, PCR NEGATIVE NEGATIVE Final   Staphylococcus aureus POSITIVE (A) NEGATIVE Final    Comment:        The Xpert SA Assay (FDA approved for NASAL specimens in patients over 20 years of age), is one component of a comprehensive surveillance program.  Test performance has been validated by [facility] for patients greater than or equal to 86 year old. It is not intended to diagnose infection nor to guide or monitor treatment. RESULT CALLED TO, READ BACK BY AND VERIFIED WITH: BULLINS,L. AT 1711 ON 11/02/2015 BY EVA          Radiology  Studies: Dg Chest 1 View  Result Date: 11/01/2015 CLINICAL DATA:  82 year old female with history of trauma from a fall with pain in the right hip and right femur. EXAM: CHEST 1 VIEW COMPARISON:  Chest x-ray 05/03/2015. FINDINGS: Diffuse peribronchial cuffing. Upper lobe volume loss as evidenced by upward retraction of the minor fissure and upward retraction of hilar structures bilaterally. Diffuse interstitial prominence, most evident throughout the mid to upper lungs, where there is a reticulonodular pattern which has progressively increased in prominence over several prior examinations. Lower lungs are relatively clear. No pleural effusions. No evidence of pulmonary edema no pneumothorax. Heart size is mildly enlarged. Upper mediastinal contours are within normal limits. Atherosclerosis in the thoracic aorta. Left-sided biventricular pacemaker device in place with lead tips projecting over the expected location of the right atrium,  right ventricle, and overlying the lateral wall the left ventricle via the coronary sinus and coronary veins. Bony thorax appears grossly intact. IMPRESSION: 1. No definite evidence to suggest significant acute traumatic injury to the thorax. Highly unusual appearance of the lungs, as above. This appearance can be seen in the setting of sarcoidosis. Other differential considerations include sequela of chronic hypersensitivity pneumonitis or sequela of chronic recurrent infections. These findings could be best evaluated with followup nonemergent high-resolution chest CT if clinically appropriate. 2. Mild cardiomegaly. 3. Aortic atherosclerosis. Electronically Signed   By: Trudie Reed M.D.   On: 11/01/2015 17:00   Dg Pelvis 1-2 Views  Result Date: 11/01/2015 CLINICAL DATA:  Fall.  Pain. EXAM: PELVIS - 1-2 VIEW COMPARISON:  03/12/2015. FINDINGS: A fracture along the greater trochanter of the right femur is noted. This is new from prior study of 03/12/2015. Right hip series  suggested for further evaluation. Old right inferior pubic ramus fracture. Degenerative changes lumbar spine and both hips. Diffuse osteopenia. IMPRESSION: A fracture is noted along the greater trochanter of the right femur. This is new from prior study 03/12/2015. Right hip series suggested for further evaluation. Electronically Signed   By: Maisie Fus  Register   On: 11/01/2015 16:55   Ct Hip Right Wo Contrast  Result Date: 11/02/2015 CLINICAL DATA:  82 year old female right hip fracture. EXAM: CT OF THE RIGHT HIP WITHOUT CONTRAST TECHNIQUE: Multidetector CT imaging of the right hip was performed according to the standard protocol. Multiplanar CT image reconstructions were also generated. COMPARISON:  Radiograph dated 11/01/2015 FINDINGS: Bones/Joint/Cartilage There is a comminuted intratrochanteric fracture of the right femur with mild proximal migration and impaction of the femoral shaft and the femoral neck. There is associated mild varus angulation. Evaluation of the fracture is however limited due to advanced osteopenia. There is mild posterior displacement of the fracture fragment of the greater trochanter. There is no dislocation. Ligaments Suboptimally assessed by CT. Muscles and Tendons Mild edema of the right thigh musculature. No fluid collection or hematoma. Soft tissues No fluid collection or hematoma. A Foley catheter is noted within the bladder. There is scattered colonic diverticula. IMPRESSION: Comminuted intertrochanteric fracture of the right femur. Advanced osteopenia. Electronically Signed   By: Elgie Collard M.D.   On: 11/02/2015 03:31   Dg C-arm 1-60 Min  Result Date: 11/03/2015 CLINICAL DATA:  Operative imaging for right proximal femur fracture ORIF. EXAM: DG C-ARM 61-120 MIN; OPERATIVE RIGHT HIP WITH PELVIS COMPARISON:  11/01/2015. FINDINGS: Two screws are supported by an medullary rod reducing the intertrochanteric fracture into near anatomic alignment. The orthopedic hardware  appears well seated well aligned. IMPRESSION: 1. Well-aligned right proximal femur intertrochanteric fracture following ORIF. Electronically Signed   By: Amie Portland M.D.   On: 11/03/2015 08:51   Dg Hip Operative Unilat W Or W/o Pelvis Right  Result Date: 11/03/2015 CLINICAL DATA:  Operative imaging for right proximal femur fracture ORIF. EXAM: DG C-ARM 61-120 MIN; OPERATIVE RIGHT HIP WITH PELVIS COMPARISON:  11/01/2015. FINDINGS: Two screws are supported by an medullary rod reducing the intertrochanteric fracture into near anatomic alignment. The orthopedic hardware appears well seated well aligned. IMPRESSION: 1. Well-aligned right proximal femur intertrochanteric fracture following ORIF. Electronically Signed   By: Amie Portland M.D.   On: 11/03/2015 08:51   Dg Femur Min 2 Views Right  Result Date: 11/01/2015 CLINICAL DATA:  Status post fall.  Right hip pain. EXAM: RIGHT FEMUR 2 VIEWS COMPARISON:  None. FINDINGS: Minimally displaced right intertrochanteric hip fracture. No  other fracture or dislocation. Right knee arthroplasty. Peripheral vascular atherosclerotic disease. IMPRESSION: 1. Minimally displaced acute right intertrochanteric hip fracture. Electronically Signed   By: Elige KoHetal  Patel   On: 11/01/2015 16:53        Scheduled Meds: . amiodarone  100 mg Oral Daily  . apixaban  2.5 mg Oral BID  .  ceFAZolin (ANCEF) IV  1 g Intravenous Q12H  . chlorhexidine  60 mL Topical Once  . feeding supplement (ENSURE ENLIVE)  237 mL Oral BID BM  . furosemide  40 mg Oral Daily  . insulin aspart  0-5 Units Subcutaneous QHS  . insulin aspart  0-9 Units Subcutaneous TID WC  . metoprolol succinate  75 mg Oral Daily  . pantoprazole  40 mg Oral Daily  . povidone-iodine  2 application Topical Once  . pravastatin  40 mg Oral q1800   Continuous Infusions: . sodium chloride 125 mL/hr at 11/03/15 0945  . dextrose 50 mL/hr at 11/02/15 1153  . lactated ringers 10 mL/hr at 11/03/15 0725     LOS: 2  days     Jacquelin HawkingRalph Hilary Pundt [facility] 11/03/2015, 3:52 PM Pager: [phone]  If 7PM-7AM, please contact night-coverage [email or web address] Password [email or web address] 11/03/2015, 3:52 PM

## 2015-11-03 NOTE — Progress Notes (Signed)
ANTICOAGULATION CONSULT NOTE - Initial Consult  Pharmacy Consult for Apixaban Indication: atrial fibrillation  Allergies  Allergen Reactions  . Coumadin [Warfarin Sodium] Other (See Comments)    Caused Patient to Bleed Out.   . Meloxicam Other (See Comments)    Unknown  . Metformin And Related Other (See Comments)    Cannot have due to kidney function  . Morphine Itching    Not in right state of mind.   . Nabumetone Nausea And Vomiting  . Oxycodone Nausea And Vomiting  . Sulfonamide Derivatives Hives    Patient Measurements: Height: 5\' 5"  (165.1 cm) Weight: 138 lb 1.6 oz (62.6 kg) IBW/kg (Calculated) : 57   Vital Signs: Temp: 97.8 F (36.6 C) (10/21 1100) Temp Source: Axillary (10/21 1100) BP: 92/46 (10/21 1100) Pulse Rate: 70 (10/21 1100)  Labs:  Recent Labs  11/01/15 1658 11/02/15 0557 11/02/15 0750 11/02/15 1638  HGB 11.5* 10.3*  --   --   HCT 35.3* 31.4*  --   --   PLT 263 231  --   --   APTT  --   --  39* 104*  LABPROT 18.8*  --  19.1*  --   INR 1.56  --  1.59  --   HEPARINUNFRC  --   --  1.69* 3.22*  CREATININE 1.24* 1.29*  --   --     Estimated Creatinine Clearance: 27.1 mL/min (by C-G formula based on SCr of 1.29 mg/dL (H)).   Medical History: Past Medical History:  Diagnosis Date  . Acid reflux   . 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 CAD (coronary artery disease)   . Chronic kidney disease (CKD), stage III (moderate)   . Essential hypertension   . History of pneumonia   . Hyperlipidemia   . Nonischemic cardiomyopathy (HCC)   . PAF (paroxysmal atrial fibrillation) (HCC)   . Type 2 diabetes mellitus (HCC)     Medications:  Scheduled:  . amiodarone  100 mg Oral Daily  .  ceFAZolin (ANCEF) IV  1 g Intravenous Q12H  .  ceFAZolin (ANCEF) IV  2 g Intravenous On Call to OR  . chlorhexidine  60 mL Topical Once  . feeding supplement (ENSURE ENLIVE)  237 mL Oral BID BM  . furosemide  40 mg Oral Daily  . insulin aspart   0-5 Units Subcutaneous QHS  . insulin aspart  0-9 Units Subcutaneous TID WC  . metoprolol succinate  75 mg Oral Daily  . pantoprazole  40 mg Oral Daily  . povidone-iodine  2 application Topical Once  . pravastatin  40 mg Oral q1800    Assessment: 82yo female now s/p R-intramedullary IM nail intertrochantric this AM.  She had been on heparin bridge to surgery, on Eliquis at home for AFib.  Now to resume Eliquis.  Goal of Therapy:  Prevention of stroke d/t AFib Monitor platelets by anticoagulation protocol: Yes   Plan:  Resume Eliquis 2.5mg  BID, first dose tonight at midnight Watch for s/s of bleeding   Marisue Humble, PharmD Clinical Pharmacist [facility]

## 2015-11-03 NOTE — Anesthesia Postprocedure Evaluation (Signed)
Anesthesia Post Note  Patient: Kathleen [name]  Procedure(s) Performed: Procedure(s) (LRB): INTRAMEDULLARY (IM) NAIL INTERTROCHANTRIC (Right)  Patient location during evaluation: PACU Anesthesia Type: General Level of consciousness: awake and alert Pain management: pain level controlled Vital Signs Assessment: post-procedure vital signs reviewed and stable Respiratory status: spontaneous breathing, nonlabored ventilation, respiratory function stable and patient connected to nasal cannula oxygen Cardiovascular status: blood pressure returned to baseline and stable Postop Assessment: no signs of nausea or vomiting Anesthetic complications: no    Last Vitals:  Vitals:   11/03/15 1024 11/03/15 1039  BP: (!) 105/50 (!) 104/54  Pulse: 70 70  Resp: 18 16  Temp:      Last Pain:  Vitals:   11/03/15 0439  TempSrc: Oral  PainSc:                  Maicey Barrientez DAVID

## 2015-11-03 NOTE — Anesthesia Preprocedure Evaluation (Addendum)
Anesthesia Evaluation  Patient identified by MRN, date of birth, ID band Patient awake    Reviewed: Allergy & Precautions, NPO status , Patient's Chart, lab work & pertinent test results  Airway Mallampati: I  TM Distance: >3 FB Neck ROM: Full    Dental  (+) Teeth Intact, Partial Upper   Pulmonary COPD,    Pulmonary exam normal        Cardiovascular hypertension, + CAD and + CABG  Normal cardiovascular exam+ dysrhythmias + pacemaker      Neuro/Psych    GI/Hepatic   Endo/Other  diabetes, Type 2  Renal/GU Renal InsufficiencyRenal disease     Musculoskeletal   Abdominal   Peds  Hematology   Anesthesia Other Findings   Reproductive/Obstetrics                            Anesthesia Physical Anesthesia Plan  ASA: III  Anesthesia Plan: General   Post-op Pain Management:    Induction: Intravenous  Airway Management Planned: Oral ETT  Additional Equipment:   Intra-op Plan:   Post-operative Plan: Extubation in OR  Informed Consent: I have reviewed the patients History and Physical, chart, labs and discussed the procedure including the risks, benefits and alternatives for the proposed anesthesia with the patient or authorized representative who has indicated his/her understanding and acceptance.     Plan Discussed with: CRNA and Surgeon  Anesthesia Plan Comments:         Anesthesia Quick Evaluation

## 2015-11-03 NOTE — Transfer of Care (Signed)
Immediate Anesthesia Transfer of Care Note  Patient: Kathleen [name]  Procedure(s) Performed: Procedure(s) with comments: INTRAMEDULLARY (IM) NAIL INTERTROCHANTRIC (Right) - 10am  Patient Location: PACU  Anesthesia Type:General  Level of Consciousness: awake, alert  and oriented  Airway & Oxygen Therapy: Patient Spontanous Breathing and Patient connected to nasal cannula oxygen  Post-op Assessment: Report given to RN, Post -op Vital signs reviewed and stable and Patient moving all extremities  Post vital signs: Reviewed and stable  Last Vitals:  Vitals:   11/02/15 2028 11/03/15 0439  BP: (!) 121/53 (!) 136/52  Pulse: 69 70  Resp:    Temp: 37.1 C 37.3 C    Last Pain:  Vitals:   11/03/15 0439  TempSrc: Oral  PainSc:       Patients Stated Pain Goal: 3 (11/02/15 1750)  Complications: No apparent anesthesia complications

## 2015-11-03 NOTE — Progress Notes (Signed)
Pharmacy: ancef  Patient is an 82 y.o F s/p IM nail intertrochantic today with ancef ordered for post-op prophylaxis.   Scr 1.29 (crcl~27).  Ancef dose reduced to 1 gm IV x1 at 8PM tonight for renal function (this should cover for 24 hrs with pre-op dose)  Dorna Leitz, PharmD, BCPS 11/03/2015 12:02 PM

## 2015-11-03 NOTE — Anesthesia Procedure Notes (Signed)
Procedure Name: Intubation Date/Time: 11/03/2015 7:43 AM Performed by: Quentin Ore Pre-anesthesia Checklist: Patient identified, Emergency Drugs available, Suction available and Patient being monitored Patient Re-evaluated:Patient Re-evaluated prior to inductionOxygen Delivery Method: Circle system utilized Preoxygenation: Pre-oxygenation with 100% oxygen Intubation Type: IV induction Ventilation: Mask ventilation without difficulty Laryngoscope Size: Mac and 3 Grade View: Grade I Tube type: Oral Tube size: 7.0 mm Number of attempts: 1 Airway Equipment and Method: Stylet Placement Confirmation: ETT inserted through vocal cords under direct vision,  positive ETCO2 and breath sounds checked- equal and bilateral Secured at: 20 cm Tube secured with: Tape Dental Injury: Teeth and Oropharynx as per pre-operative assessment

## 2015-11-03 NOTE — Consult Note (Signed)
ORTHOPAEDIC CONSULTATION  REQUESTING PHYSICIAN: Mariel Aloe, MD  Chief Complaint: Right intertroch hip fracture  HPI: Kathleen [name] is a 82 y.o. female who presents with right intertroch hip fracture s/p mechanical fall PTA.  The patient endorses severe pain in the right hip, that does not radiate, grinding in quality, worse with any movement, better with immobilization.  Denies LOC/fever/chills/nausea/vomiting.  Walks without assistive devices (walker, cane, wheelchair).  Does live independently with daughter.  Denies LOC, neck pain, abd pain.  Patient was transferred from [facility] due to lack of anesthesia staff.    Past Medical History:  Diagnosis Date  . Acid reflux   . 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 CAD (coronary artery disease)   . Chronic kidney disease (CKD), stage III (moderate)   . Essential hypertension   . History of pneumonia   . Hyperlipidemia   . Nonischemic cardiomyopathy ([place])   . PAF (paroxysmal atrial fibrillation) ([place])   . Type 2 diabetes mellitus ([place])    Past Surgical History:  Procedure Laterality Date  . Bladder tack  1970's  . CARDIAC CATHETERIZATION  10/15/2011   Normal coronaries  . CARDIOVERSION N/A 10/28/2013   Procedure: CARDIOVERSION;  Surgeon: Sanda Klein, MD;  Location: [facility] ENDOSCOPY;  Service: Cardiovascular;  Laterality: N/A;  . CHOLECYSTECTOMY  1999  . LEFT AND RIGHT HEART CATHETERIZATION WITH CORONARY ANGIOGRAM N/A 10/15/2011   Procedure: LEFT AND RIGHT HEART CATHETERIZATION WITH CORONARY ANGIOGRAM;  Surgeon: Pixie Casino, MD;  Location: [facility] CATH LAB;  Service: Cardiovascular;  Laterality: N/A;  . NM MYOCAR PERF WALL MOTION  03/15/2009   Normal  . PACEMAKER INSERTION  10/16/2011   Medtronic  . PERMANENT PACEMAKER INSERTION N/A 03/24/2011   Procedure: PERMANENT PACEMAKER INSERTION;  Surgeon: Sanda Klein, MD;  Location: [facility] CATH LAB;  Service: Cardiovascular;  Laterality: N/A;  . REPLACEMENT TOTAL KNEE      Social History   Social History  . Marital status: Widowed    Spouse name: N/A  . Number of children: N/A  . Years of education: N/A   Social History Main Topics  . Smoking status: Never Smoker  . Smokeless tobacco: Never Used  . Alcohol use No  . Drug use: No  . Sexual activity: Not Currently   Other Topics Concern  . None   Social History Narrative  . None   Family History  Problem Relation Age of Onset  . Suicidality Mother   . Heart murmur Daughter   . Heart attack Brother    Allergies  Allergen Reactions  . Coumadin [Warfarin Sodium] Other (See Comments)    Caused Patient to Bleed Out.   . Meloxicam Other (See Comments)    Unknown  . Metformin And Related Other (See Comments)    Cannot have due to kidney function  . Morphine Itching    Not in right state of mind.   . Nabumetone Nausea And Vomiting  . Oxycodone Nausea And Vomiting  . Sulfonamide Derivatives Hives   Prior to Admission medications   Medication Sig Start Date End Date Taking? Authorizing Provider  amiodarone (PACERONE) 200 MG tablet Take 0.5 tablets (100 mg total) by mouth daily. 07/30/15  Yes Burtis Junes, NP  Coenzyme Q10 (CO Q 10 PO) Take 1 tablet by mouth daily.   Yes Historical Provider, MD  ELIQUIS 2.5 MG TABS tablet TAKE ONE TABLET BY MOUTH TWICE DAILY. 07/09/15  Yes Mihai Croitoru, MD  furosemide (LASIX)  40 MG tablet Take 40 mg by mouth daily. 3 lbs weight gain in 24 hours extra half tablet ( 20 mg )   Yes Historical Provider, MD  glimepiride (AMARYL) 1 MG tablet Take 0.5 mg by mouth as needed (for blood sugar levels over 120).    Yes Historical Provider, MD  HYDROcodone-acetaminophen (NORCO/VICODIN) 5-325 MG tablet Take 0.5-1 tablets by mouth every 4 (four) hours as needed for severe pain. 09/11/15  Yes Mihai Croitoru, MD  lubiprostone (AMITIZA) 24 MCG capsule Take 24 mcg by mouth 2 (two) times daily with a meal.   Yes Historical Provider, MD  meclizine (ANTIVERT) 25 MG tablet Take 25  mg by mouth 3 (three) times daily as needed for dizziness.   Yes Historical Provider, MD  metolazone (ZAROXOLYN) 2.5 MG tablet Take 2.5 mg by mouth daily. If patients weighs over 140 take 1 tablet (2.5 mg ) with extra half of lasix (20 mg )   Yes Historical Provider, MD  metoprolol succinate (TOPROL-XL) 25 MG 24 hr tablet TAKE 3 TABLETS BY MOUTH DAILY. 09/18/15  Yes Mihai Croitoru, MD  Multiple Vitamins-Minerals (CENTRUM SILVER PO) Take 1 tablet by mouth every morning.    Yes Historical Provider, MD  Multiple Vitamins-Minerals (ICAPS AREDS 2) CAPS Take 1 capsule by mouth 2 (two) times daily.   Yes Historical Provider, MD  nitroGLYCERIN (NITROSTAT) 0.4 MG SL tablet DISSOLVE 1 TABLET UNDER TONGUE EVERY 5 MINUTES UP TO 15 MIN FOR CHESTPAIN. IF NO RELIEF CALL 911. 12/25/14  Yes Mihai Croitoru, MD  omeprazole (PRILOSEC) 20 MG capsule TAKE ONE CAPSULE BY MOUTH DAILY. 06/18/15  Yes Mihai Croitoru, MD  penicillin v potassium (VEETID) 500 MG tablet Take 500 mg by mouth once. Prior to dental procedure   Yes Historical Provider, MD  potassium chloride (KLOR-CON) 8 MEQ tablet Take 1 tablet (8 mEq total) by mouth 3 (three) times daily. 05/08/15  Yes Mihai Croitoru, MD  pravastatin (PRAVACHOL) 40 MG tablet TAKE 1 TABLET BY MOUTH EACH EVENING. 06/06/15  Yes Mihai Croitoru, MD  ondansetron (ZOFRAN) 4 MG tablet Take 4 mg by mouth every 4 (four) hours as needed for nausea or vomiting.    Historical Provider, MD   Dg Chest 1 View  Result Date: 11/01/2015 CLINICAL DATA:  82 year old female with history of trauma from a fall with pain in the right hip and right femur. EXAM: CHEST 1 VIEW COMPARISON:  Chest x-ray 05/03/2015. FINDINGS: Diffuse peribronchial cuffing. Upper lobe volume loss as evidenced by upward retraction of the minor fissure and upward retraction of hilar structures bilaterally. Diffuse interstitial prominence, most evident throughout the mid to upper lungs, where there is a reticulonodular pattern which has  progressively increased in prominence over several prior examinations. Lower lungs are relatively clear. No pleural effusions. No evidence of pulmonary edema no pneumothorax. Heart size is mildly enlarged. Upper mediastinal contours are within normal limits. Atherosclerosis in the thoracic aorta. Left-sided biventricular pacemaker device in place with lead tips projecting over the expected location of the right atrium, right ventricle, and overlying the lateral wall the left ventricle via the coronary sinus and coronary veins. Bony thorax appears grossly intact. IMPRESSION: 1. No definite evidence to suggest significant acute traumatic injury to the thorax. Highly unusual appearance of the lungs, as above. This appearance can be seen in the setting of sarcoidosis. Other differential considerations include sequela of chronic hypersensitivity pneumonitis or sequela of chronic recurrent infections. These findings could be best evaluated with followup nonemergent high-resolution chest CT if  clinically appropriate. 2. Mild cardiomegaly. 3. Aortic atherosclerosis. Electronically Signed   By: Vinnie Langton M.D.   On: 11/01/2015 17:00   Dg Pelvis 1-2 Views  Result Date: 11/01/2015 CLINICAL DATA:  Fall.  Pain. EXAM: PELVIS - 1-2 VIEW COMPARISON:  03/12/2015. FINDINGS: A fracture along the greater trochanter of the right femur is noted. This is new from prior study of 03/12/2015. Right hip series suggested for further evaluation. Old right inferior pubic ramus fracture. Degenerative changes lumbar spine and both hips. Diffuse osteopenia. IMPRESSION: A fracture is noted along the greater trochanter of the right femur. This is new from prior study 03/12/2015. Right hip series suggested for further evaluation. Electronically Signed   By: Marcello Moores  Register   On: 11/01/2015 16:55   Ct Hip Right Wo Contrast  Result Date: 11/02/2015 CLINICAL DATA:  82 year old female right hip fracture. EXAM: CT OF THE RIGHT HIP WITHOUT  CONTRAST TECHNIQUE: Multidetector CT imaging of the right hip was performed according to the standard protocol. Multiplanar CT image reconstructions were also generated. COMPARISON:  Radiograph dated 11/01/2015 FINDINGS: Bones/Joint/Cartilage There is a comminuted intratrochanteric fracture of the right femur with mild proximal migration and impaction of the femoral shaft and the femoral neck. There is associated mild varus angulation. Evaluation of the fracture is however limited due to advanced osteopenia. There is mild posterior displacement of the fracture fragment of the greater trochanter. There is no dislocation. Ligaments Suboptimally assessed by CT. Muscles and Tendons Mild edema of the right thigh musculature. No fluid collection or hematoma. Soft tissues No fluid collection or hematoma. A Foley catheter is noted within the bladder. There is scattered colonic diverticula. IMPRESSION: Comminuted intertrochanteric fracture of the right femur. Advanced osteopenia. Electronically Signed   By: Anner Crete M.D.   On: 11/02/2015 03:31   Dg Femur Min 2 Views Right  Result Date: 11/01/2015 CLINICAL DATA:  Status post fall.  Right hip pain. EXAM: RIGHT FEMUR 2 VIEWS COMPARISON:  None. FINDINGS: Minimally displaced right intertrochanteric hip fracture. No other fracture or dislocation. Right knee arthroplasty. Peripheral vascular atherosclerotic disease. IMPRESSION: 1. Minimally displaced acute right intertrochanteric hip fracture. Electronically Signed   By: Kathreen Devoid   On: 11/01/2015 16:53    All pertinent xrays, MRI, CT independently reviewed and interpreted  Positive ROS: All other systems have been reviewed and were otherwise negative with the exception of those mentioned in the HPI and as above.  Physical Exam: General: Alert, no acute distress Cardiovascular: No pedal edema Respiratory: No cyanosis, no use of accessory musculature GI: No organomegaly, abdomen is soft and  non-tender Skin: No lesions in the area of chief complaint Neurologic: Sensation intact distally Psychiatric: Patient is competent for consent with normal mood and affect Lymphatic: No axillary or cervical lymphadenopathy  MUSCULOSKELETAL:  - pain with movement of the hip and extremity - skin intact - NVI distally - compartments soft  Assessment: Right intertroch hip fracture  Plan: - surgery is recommended, patient and family are aware of r/b/a and wish to proceed - consent obtained - medical optimization per primary team - surgery is planned for today - Based on history and fracture pattern this likely represents a fragility fracture. - Fragility fractures affect up to one half of women and one third of men after age 4 years and occur in the setting of bone disorder such as osteoporosis or osteopenia and warrant appropriate work-up. - The following are general recommendations that may serve as an outline for an appropriate work-up:  1.) Obtain bone density measurement to confirm presumptive diagnosis, assess severity of osteoporosis and risk of future fracture, and use as baseline for monitoring treatment  2.) Obtain laboratory tests: CBC, ESR, serum calcium, creatinine, albumin,phosphate, alkaline phosphatase, liver transaminases, protein electrophoresis, urinalysis, 25-hydroxyvitamin D.  3.) Exclude secondary causes of low bone mass and skeletal fragility (eg,multiple myeloma, lymphoma) as indicated.  4.) Obtain radiograph of thoracic and lumbar spine, particularly among individuals with back pain or height loss to assess presence of vertebral fractures  5.) Intermittent administration of recombinant human parathyroid hormone  6.) Optimize nutritional status using nutritional supplementation.  7.) Patient/family education to prevent future falls.  8.) Early mobilization and exercise program - exercise decreases the rate of bone loss and has been associated with decreased  rate of fragility fractures   Thank you for the consult and the opportunity to see Kathleen [name]  N. Eduard Roux, MD [place] 6:58 AM

## 2015-11-04 DIAGNOSIS — N179 Acute kidney failure, unspecified: Secondary | ICD-10-CM

## 2015-11-04 DIAGNOSIS — I2510 Atherosclerotic heart disease of native coronary artery without angina pectoris: Secondary | ICD-10-CM

## 2015-11-04 DIAGNOSIS — E871 Hypo-osmolality and hyponatremia: Secondary | ICD-10-CM

## 2015-11-04 LAB — BASIC METABOLIC PANEL
Anion gap: 7 (ref 5–15)
Anion gap: 10 (ref 5–15)
BUN: 23 mg/dL — ABNORMAL HIGH (ref 6–20)
BUN: 27 mg/dL — AB (ref 6–20)
CALCIUM: 7.6 mg/dL — AB (ref 8.9–10.3)
CHLORIDE: 92 mmol/L — AB (ref 101–111)
CO2: 25 mmol/L (ref 22–32)
CO2: 24 mmol/L (ref 22–32)
CREATININE: 1.58 mg/dL — AB (ref 0.44–1.00)
Calcium: 7.8 mg/dL — ABNORMAL LOW (ref 8.9–10.3)
Chloride: 94 mmol/L — ABNORMAL LOW (ref 101–111)
Creatinine, Ser: 1.48 mg/dL — ABNORMAL HIGH (ref 0.44–1.00)
GFR calc Af Amer: 33 mL/min — ABNORMAL LOW (ref >60)
GFR calc non Af Amer: 28 mL/min — ABNORMAL LOW (ref >60)
GFR, EST AFRICAN AMERICAN: 35 mL/min — AB (ref >60)
GFR, EST NON AFRICAN AMERICAN: 30 mL/min — AB (ref >60)
Glucose, Bld: 186 mg/dL — ABNORMAL HIGH (ref 65–99)
Glucose, Bld: 234 mg/dL — ABNORMAL HIGH (ref 65–99)
Potassium: 4.2 mmol/L (ref 3.5–5.1)
Potassium: 4.3 mmol/L (ref 3.5–5.1)
SODIUM: 126 mmol/L — AB (ref 135–145)
Sodium: 126 mmol/L — ABNORMAL LOW (ref 135–145)

## 2015-11-04 LAB — GLUCOSE, CAPILLARY
Glucose-Capillary: 187 mg/dL — ABNORMAL HIGH (ref 65–99)
Glucose-Capillary: 214 mg/dL — ABNORMAL HIGH (ref 65–99)

## 2015-11-04 LAB — TYPE AND SCREEN
ABO/RH(D): A NEG
ANTIBODY SCREEN: NEGATIVE
UNIT DIVISION: 0
UNIT DIVISION: 0

## 2015-11-04 LAB — CBC
HEMATOCRIT: 21 % — AB (ref 36.0–46.0)
Hemoglobin: 7.1 g/dL — ABNORMAL LOW (ref 12.0–15.0)
MCH: 32 pg (ref 26.0–34.0)
MCHC: 33.8 g/dL (ref 30.0–36.0)
MCV: 94.6 fL (ref 78.0–100.0)
PLATELETS: 174 10*3/uL (ref 150–400)
RBC: 2.22 MIL/uL — ABNORMAL LOW (ref 3.87–5.11)
RDW: 14.6 % (ref 11.5–15.5)
WBC: 12.9 10*3/uL — ABNORMAL HIGH (ref 4.0–10.5)

## 2015-11-04 LAB — URINE CULTURE: SPECIAL REQUESTS: NORMAL

## 2015-11-04 LAB — PREPARE RBC (CROSSMATCH)

## 2015-11-04 MED ORDER — SODIUM CHLORIDE 0.9 % IV SOLN
Freq: Once | INTRAVENOUS | Status: AC
  Administered 2015-11-04: 09:00:00 via INTRAVENOUS

## 2015-11-04 MED ORDER — SODIUM CHLORIDE 0.9 % IV SOLN: INTRAVENOUS | Status: DC

## 2015-11-04 MED ORDER — FUROSEMIDE 10 MG/ML IJ SOLN
20.0000 mg | Freq: Once | INTRAMUSCULAR | Status: AC
  Administered 2015-11-04: 20 mg via INTRAVENOUS
  Filled 2015-11-04: qty 2

## 2015-11-04 MED ORDER — LUBIPROSTONE 24 MCG PO CAPS
24.0000 ug | ORAL_CAPSULE | Freq: Two times a day (BID) | ORAL | Status: DC
  Administered 2015-11-04 – 2015-11-07 (×7): 24 ug via ORAL
  Filled 2015-11-04 (×9): qty 1

## 2015-11-04 NOTE — Progress Notes (Signed)
   Subjective:  Patient reports pain as moderate.  No events.  Objective:   VITALS:   Vitals:   11/03/15 1300 11/03/15 2126 11/04/15 0202 11/04/15 0653  BP: (!) 105/49 (!) 110/44 (!) 106/46 (!) 122/46  Pulse: 72 82 71 71  Resp:      Temp: 97.8 F (36.6 C) 100.1 F (37.8 C) 98.7 F (37.1 C) 98.7 F (37.1 C)  TempSrc: Oral Oral Oral Oral  SpO2: 99% 93% 99% 100%  Weight:      Height:        Neurologically intact Neurovascular intact Sensation intact distally Intact pulses distally Dorsiflexion/Plantar flexion intact Incision: dressing C/D/I and no drainage No cellulitis present Compartment soft   Lab Results  Component Value Date   WBC 12.9 (H) 11/04/2015   HGB 7.1 (L) 11/04/2015   HCT 21.0 (L) 11/04/2015   MCV 94.6 11/04/2015   PLT 174 11/04/2015     Assessment/Plan:  1 Day Post-Op   - Expected postop acute blood loss anemia - will monitor for symptoms - consider transfusion of at least 1 unit of RBCs - Up with PT/OT - DVT ppx - SCDs, ambulation, eliquis - WBAT operative extremity - SNF pending  Durrell Barajas Donnelly Stager 11/04/2015, 8:47 AM [phone]

## 2015-11-04 NOTE — Evaluation (Signed)
Physical Therapy Evaluation Patient Details Name: Kathleen [name] MRN: [id number] DOB: 1924-07-14 Today's Date: 11/04/2015   History of Present Illness  Kathleen B Southardis a 82 y.o.femalewith hx of PAF, on Eliquis, s/p pacemaker, hx of CAD s/p prior CABG, now believe to have non ischemic cardiomyopathy, with diastolic CHF, DM, HTN, HLD, fell and broke her right hip. Underwent pinning 11/03/15  Clinical Impression  Pt admitted with above diagnosis. Pt currently with functional limitations due to the deficits listed below (see PT Problem List). Pt generally weak and fatigued today, receiving blood which will hopefully help energy level next session. Requiring +2 max A to stand and maintain standing and unable to step at this point.  Pt will benefit from skilled PT to increase their independence and safety with mobility to allow discharge to the venue listed below.       Follow Up Recommendations SNF;Supervision/Assistance - 24 hour    Equipment Recommendations  None recommended by PT    Recommendations for Other Services       Precautions / Restrictions Precautions Precautions: Fall Precaution Comments: has had multiple falls Restrictions Weight Bearing Restrictions: Yes RLE Weight Bearing: Weight bearing as tolerated      Mobility  Bed Mobility               General bed mobility comments: Pt OOB in chair upon arrival.  Transfers Overall transfer level: Needs assistance Equipment used: Rolling walker (2 wheeled) Transfers: Sit to/from Stand Sit to Stand: Max assist;+2 physical assistance         General transfer comment: Max assist +2 for sit to stand from chair x1. Pt with difficulty getting to full upright posture, bil knees flexed throughout (R>L). Support given at right elbow and knee and pt generally exhausted with all activity  Ambulation/Gait             General Gait Details: unable  Stairs            Wheelchair Mobility    Modified  Rankin (Stroke Patients Only)       Balance Overall balance assessment: Needs assistance Sitting-balance support: Bilateral upper extremity supported Sitting balance-Leahy Scale: Fair     Standing balance support: Bilateral upper extremity supported Standing balance-Leahy Scale: Poor Standing balance comment: max A +2 and RW to stand                             Pertinent Vitals/Pain Pain Assessment: Faces Faces Pain Scale: Hurts little more Pain Location: R hip in standing Pain Descriptors / Indicators: Sore Pain Intervention(s): Limited activity within patient's tolerance;Monitored during session    Home Living Family/patient expects to be discharged to:: Skilled nursing facility Living Arrangements: Children Available Help at Discharge: Family           Home Equipment: Gilmer Morane - single point;Walker - 2 wheels      Prior Function Level of Independence: Independent with assistive device(s)         Comments: RW and cane for mobility. Independent with BADL.     Hand Dominance        Extremity/Trunk Assessment   Upper Extremity Assessment: Defer to OT evaluation;Generalized weakness           Lower Extremity Assessment: Generalized weakness;RLE deficits/detail RLE Deficits / Details: knee buckling in standing, blocked by therapist, also with generalized weakness BLE's    Cervical / Trunk Assessment: Kyphotic  Communication   Communication: No difficulties  Cognition Arousal/Alertness: Awake/alert Behavior During Therapy: WFL for tasks assessed/performed Overall Cognitive Status: Within Functional Limits for tasks assessed                      General Comments General comments (skin integrity, edema, etc.): pt's daughter reports that pt's health has been declining last few years and not recovering fully between each event. Pt receiving blood during treatment session, hgb 7/1 this am    Exercises General Exercises - Lower  Extremity Ankle Circles/Pumps: AROM;Both;10 reps;Seated Heel Slides: AROM;Right;10 reps;Seated   Assessment/Plan    PT Assessment Patient needs continued PT services  PT Problem List Decreased strength;Decreased range of motion;Decreased activity tolerance;Decreased balance;Decreased mobility;Pain          PT Treatment Interventions DME instruction;Gait training;Functional mobility training;Therapeutic activities;Therapeutic exercise;Balance training;Patient/family education    PT Goals (Current goals can be found in the Care Plan section)  Acute Rehab PT Goals Patient Stated Goal: rehab before home PT Goal Formulation: With patient Time For Goal Achievement: 11/18/15 Potential to Achieve Goals: Fair    Frequency Min 3X/week   Barriers to discharge Decreased caregiver support daughter cannot physically assist pt    Co-evaluation PT/OT/SLP Co-Evaluation/Treatment: Yes Reason for Co-Treatment: For patient/therapist safety;Complexity of the patient's impairments (multi-system involvement) PT goals addressed during session: Mobility/safety with mobility;Balance;Proper use of DME OT goals addressed during session: Other (comment) (functional mobility)       End of Session Equipment Utilized During Treatment: Gait belt Activity Tolerance: Patient tolerated treatment well Patient left: in chair;with call bell/phone within reach;with family/visitor present Nurse Communication: Mobility status         Time: 1139-1205 PT Time Calculation (min) (ACUTE ONLY): 26 min   Charges:   PT Evaluation $PT Eval Moderate Complexity: 1 Procedure     PT G Codes:       Lyanne Co, PT  Acute Rehab Services  [phone]  [place], [place] 11/04/2015, 1:48 PM

## 2015-11-04 NOTE — Progress Notes (Signed)
Original consult note by Dr Purvis Sheffield reviewed, refer to regarding evaluation for surgical clearance. Please call us over the weekend if cardiology assistance is needed in the postop period.   Dominga Ferry MD

## 2015-11-04 NOTE — Progress Notes (Signed)
PROGRESS NOTE    Kathleen [name]  ZOX:[id number] DOB: 03-Sep-1924 DOA: 11/01/2015 PCP: [name]., MD   Brief Narrative:  Kathleen [name] is a 82 y.o. 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 She denied LOC, palpitation, but complained of right hip pain. Xray confirmed mild displaced right intertroch Fx, EKG showed paced rhythm, CXR showed unusual pattern, but no infiltrate, Cr of 1.2, Hb of 11.5 g per dL, and INR of 1.5. EDP consulted Dr Romeo Apple, and hospitalist was asked to admit her for further Tx.    Assessment & Plan:   Principal Problem:   Hip fracture (HCC) Active Problems:   Hypertension   DM type 2 (diabetes mellitus, type 2) (HCC)   Hyperlipidemia   Coronary atherosclerosis, no significant obstructive lesions   Closed displaced intertrochanteric fracture of right femur (HCC)   Malnutrition of moderate degree  Right hip fracture Patient is s/p surgery on 10/21. Got up today. Doing better -PT recommending SNF  Anemia Likely blood loss from surgery. Patient with significant cardiac history. Transfusion threshold of 8. Currently down to 7.1 -transfuse 2u PRBC, chase with lasix between units. Transfuse each unit over 4 hours -recheck H&H after transfusion  Hyponatremia Sodium of 126 from 133. Patient is euvolemic. Creatinine up, but feel this could be more cardiorenal than from dehydration. -discontinue IVF -fluid restrict -recheck BMP this afternoon  Diabetes mellitus Glimepiride at home -SSI  Chronic combined systolic and diastolic heart failure Patient takes lasix, metoprolol, metolazone prn weight >140lbs. Last EF 40%. Currently euvolemic. Slight worsened creatinine. May have gotten too much fluids. No weight today -continue metoprolol -continue furosemide -daily weight -in/out  Atrial fibrillation Takes  amiodarone, metoprolol and Eliquis as outpatient -continue home meds  Hypertention -continue metoprolol, amiodarone  Coronary atherosclerosis Hyperlipidemia -continue pravastatin   DVT prophylaxis: Eliquis Code Status: DNR Family Communication: Daughter at bedside Disposition Plan: Discharge to SNF in 1-2 days   Consultants:   Orthopedic surgery  Procedures:   Right hip pin placement (10/21)  Antimicrobials:  None    Subjective: Patient reports no issues this morning.  Objective: Vitals:   11/03/15 2126 11/04/15 0202 11/04/15 0653 11/04/15 1048  BP: (!) 110/44 (!) 106/46 (!) 122/46 (!) 83/41  Pulse: 82 71 71 71  Resp:    16  Temp: 100.1 F (37.8 C) 98.7 F (37.1 C) 98.7 F (37.1 C) 97.9 F (36.6 C)  TempSrc: Oral Oral Oral Oral  SpO2: 93% 99% 100% 92%  Weight:      Height:        Intake/Output Summary (Last 24 hours) at 11/04/15 1356 Last data filed at 11/04/15 1245  Gross per 24 hour  Intake              870 ml  Output              600 ml  Net              270 ml   Filed Weights   11/01/15 1550 11/01/15 2030  Weight: 62.1 kg (137 lb) 62.6 kg (138 lb 1.6 oz)    Examination:  General exam: Appears calm and comfortable. Sitting in chair. Respiratory system: Clear to auscultation. Respiratory effort normal. Cardiovascular system: S1 & S2 heard, RRR. No murmurs, rubs, gallops or clicks. Gastrointestinal system: Abdomen is nondistended, soft and nontender. Normal  bowel sounds heard. Central nervous system: Alert and oriented. No focal neurological deficits. Extremities: No edema. No calf tenderness Skin: No cyanosis. No rashes Psychiatry: Judgement and insight appear normal. Mood & affect appropriate.     Data Reviewed: I have personally reviewed following labs and imaging studies  CBC:  Recent Labs Lab 11/01/15 1658 11/02/15 0557 11/04/15 0318  WBC 11.8* 9.5 12.9*  NEUTROABS 10.1*  --   --   HGB 11.5* 10.3* 7.1*  HCT 35.3* 31.4* 21.0*   MCV 98.3 98.4 94.6  PLT 263 231 174   Basic Metabolic Panel:  Recent Labs Lab 11/01/15 1658 11/02/15 0557 11/04/15 0318  NA 132* 133* 126*  K 4.1 4.4 4.2  CL 96* 99* 94*  CO2 28 27 25   GLUCOSE 180* 214* 186*  BUN 25* 25* 23*  CREATININE 1.24* 1.29* 1.48*  CALCIUM 8.6* 8.3* 7.6*   GFR: Estimated Creatinine Clearance: 23.6 mL/min (by C-G formula based on SCr of 1.48 mg/dL (H)). Liver Function Tests:  Recent Labs Lab 11/01/15 1658  AST 24  ALT 22  ALKPHOS 120  BILITOT 0.8  PROT 6.7  ALBUMIN 3.4*   No results for input(s): LIPASE, AMYLASE in the last 168 hours. No results for input(s): AMMONIA in the last 168 hours. Coagulation Profile:  Recent Labs Lab 11/01/15 1658 11/02/15 0750  INR 1.56 1.59   Cardiac Enzymes: No results for input(s): CKTOTAL, CKMB, CKMBINDEX, TROPONINI in the last 168 hours. BNP (last 3 results) No results for input(s): PROBNP in the last 8760 hours. HbA1C:  Recent Labs  11/02/15 0557  HGBA1C 7.3*   CBG:  Recent Labs Lab 11/03/15 0906 11/03/15 1153 11/03/15 1651 11/03/15 2209 11/04/15 0635  GLUCAP 189* 192* 194* 247* 187*   Lipid Profile: No results for input(s): CHOL, HDL, LDLCALC, TRIG, CHOLHDL, LDLDIRECT in the last 72 hours. Thyroid Function Tests: No results for input(s): TSH, T4TOTAL, FREET4, T3FREE, THYROIDAB in the last 72 hours. Anemia Panel: No results for input(s): VITAMINB12, FOLATE, FERRITIN, TIBC, IRON, RETICCTPCT in the last 72 hours. Sepsis Labs: No results for input(s): PROCALCITON, LATICACIDVEN in the last 168 hours.  Recent Results (from the past 240 hour(s))  Urine culture     Status: Abnormal   Collection Time: 11/01/15  5:28 PM  Result Value Ref Range Status   Specimen Description URINE, CATHETERIZED  Final   Special Requests Normal  Final   Culture >=100,000 COLONIES/mL KLEBSIELLA PNEUMONIAE (A)  Final   Report Status 11/04/2015 FINAL  Final   Organism ID, Bacteria KLEBSIELLA PNEUMONIAE (A)   Final      Susceptibility   Klebsiella pneumoniae - MIC*    AMPICILLIN >=32 RESISTANT Resistant     CEFAZOLIN <=4 SENSITIVE Sensitive     CEFTRIAXONE <=1 SENSITIVE Sensitive     CIPROFLOXACIN <=0.25 SENSITIVE Sensitive     GENTAMICIN <=1 SENSITIVE Sensitive     IMIPENEM <=0.25 SENSITIVE Sensitive     NITROFURANTOIN 64 INTERMEDIATE Intermediate     TRIMETH/SULFA <=20 SENSITIVE Sensitive     AMPICILLIN/SULBACTAM 4 SENSITIVE Sensitive     PIP/TAZO <=4 SENSITIVE Sensitive     Extended ESBL NEGATIVE Sensitive     * >=100,000 COLONIES/mL KLEBSIELLA PNEUMONIAE  Surgical pcr screen     Status: Abnormal   Collection Time: 11/02/15  2:25 PM  Result Value Ref Range Status   MRSA, PCR NEGATIVE NEGATIVE Final   Staphylococcus aureus POSITIVE (A) NEGATIVE Final    Comment:        The Xpert SA  Assay (FDA approved for NASAL specimens in patients over 55 years of age), is one component of a comprehensive surveillance program.  Test performance has been validated by [facility] for patients greater than or equal to 43 year old. It is not intended to diagnose infection nor to guide or monitor treatment. RESULT CALLED TO, READ BACK BY AND VERIFIED WITH: BULLINS,L. AT 1711 ON 11/02/2015 BY EVA          Radiology Studies: Dg C-arm 1-60 Min  Result Date: 11/03/2015 CLINICAL DATA:  Operative imaging for right proximal femur fracture ORIF. EXAM: DG C-ARM 61-120 MIN; OPERATIVE RIGHT HIP WITH PELVIS COMPARISON:  11/01/2015. FINDINGS: Two screws are supported by an medullary rod reducing the intertrochanteric fracture into near anatomic alignment. The orthopedic hardware appears well seated well aligned. IMPRESSION: 1. Well-aligned right proximal femur intertrochanteric fracture following ORIF. Electronically Signed   By: Amie Portland M.D.   On: 11/03/2015 08:51   Dg Hip Operative Unilat W Or W/o Pelvis Right  Result Date: 11/03/2015 CLINICAL DATA:  Operative imaging for right proximal femur  fracture ORIF. EXAM: DG C-ARM 61-120 MIN; OPERATIVE RIGHT HIP WITH PELVIS COMPARISON:  11/01/2015. FINDINGS: Two screws are supported by an medullary rod reducing the intertrochanteric fracture into near anatomic alignment. The orthopedic hardware appears well seated well aligned. IMPRESSION: 1. Well-aligned right proximal femur intertrochanteric fracture following ORIF. Electronically Signed   By: Amie Portland M.D.   On: 11/03/2015 08:51        Scheduled Meds: . amiodarone  100 mg Oral Daily  . apixaban  2.5 mg Oral BID  . chlorhexidine  60 mL Topical Once  . Chlorhexidine Gluconate Cloth  6 each Topical Q0600  . feeding supplement (ENSURE ENLIVE)  237 mL Oral BID BM  . furosemide  40 mg Oral Daily  . insulin aspart  0-5 Units Subcutaneous QHS  . insulin aspart  0-9 Units Subcutaneous TID WC  . metoprolol succinate  75 mg Oral Daily  . mupirocin ointment  1 application Nasal BID  . pantoprazole  40 mg Oral Daily  . povidone-iodine  2 application Topical Once  . pravastatin  40 mg Oral q1800   Continuous Infusions: . lactated ringers 1 mL (11/04/15 0625)     LOS: 3 days     Jacquelin Hawking [facility] 11/04/2015, 1:56 PM Pager: [phone]  If 7PM-7AM, please contact night-coverage [email or web address] Password [email or web address] 11/04/2015, 1:56 PM

## 2015-11-04 NOTE — Evaluation (Signed)
Occupational Therapy Evaluation Patient Details Name: SKYLEEN BENTLEY MRN: [id number] DOB: 1924/09/13 Today's Date: 11/04/2015    History of Present Illness Shaelyn B Southardis a 82 y.o.femalewith hx of PAF, on Eliquis, s/p pacemaker, hx of CAD s/p prior CABG, now believe to have non ischemic cardiomyopathy, with diastolic CHF, DM, HTN, HLD, fell and broke her right hip. Underwent pinning 11/03/15   Clinical Impression   Pt reports she was independent with ADL PTA. Currently pt requires max assist +2 for sit to stand from chair with difficulty getting to full upright posture. Pt presenting with generalized weakness and hx of falls over the past few years. Recommending SNF for follow up to maximize independence and safety with ADL and functional mobility prior to return home with assist from her daughter. All further OT needs can be met at the next venue of care; will defer OT to SNF. Please re-consult if needs change. Thank you for this referral.    Follow Up Recommendations  SNF;Supervision/Assistance - 24 hour    Equipment Recommendations  Other (comment) (TBD at next venue)    Recommendations for Other Services       Precautions / Restrictions Precautions Precautions: Fall Restrictions Weight Bearing Restrictions: Yes RLE Weight Bearing: Weight bearing as tolerated      Mobility Bed Mobility               General bed mobility comments: Pt OOB in chair upon arrival.  Transfers Overall transfer level: Needs assistance Equipment used: Rolling walker (2 wheeled) Transfers: Sit to/from Stand Sit to Stand: Max assist;+2 physical assistance         General transfer comment: Max assist +2 for sit to stand from chair x1. Pt with difficulty getting to full upright posture, bil knees flexed throughout (R>L)    Balance Overall balance assessment: Needs assistance Sitting-balance support: Feet supported;Bilateral upper extremity supported Sitting balance-Leahy  Scale: Fair     Standing balance support: Bilateral upper extremity supported Standing balance-Leahy Scale: Poor Standing balance comment: RW for UE support and max assist +2                            ADL Overall ADL's : Needs assistance/impaired     Grooming: Set up;Min guard;Sitting   Upper Body Bathing: Min guard;Sitting   Lower Body Bathing: Maximal assistance;+2 for physical assistance;Sit to/from stand   Upper Body Dressing : Min guard;Sitting   Lower Body Dressing: Maximal assistance;+2 for physical assistance;Sit to/from stand               Functional mobility during ADLs: Maximal assistance;+2 for physical assistance;Rolling walker (for sit to stand only from chair) General ADL Comments: Pt very deconditioned with hx of falls over the past few years. Agreeable to post acute rehab and motivated to participate in therapy.     Vision Vision Assessment?: No apparent visual deficits   Perception     Praxis      Pertinent Vitals/Pain Pain Assessment: Faces Faces Pain Scale: Hurts little more Pain Location: R hip in standing Pain Descriptors / Indicators: Sore Pain Intervention(s): Monitored during session     Hand Dominance     Extremity/Trunk Assessment Upper Extremity Assessment Upper Extremity Assessment: Generalized weakness   Lower Extremity Assessment Lower Extremity Assessment: Defer to PT evaluation   Cervical / Trunk Assessment Cervical / Trunk Assessment: Kyphotic   Communication Communication Communication: No difficulties   Cognition Arousal/Alertness: Awake/alert Behavior During  Therapy: WFL for tasks assessed/performed Overall Cognitive Status: Within Functional Limits for tasks assessed                     General Comments       Exercises       Shoulder Instructions      Home Living Family/patient expects to be discharged to:: Skilled nursing facility Living Arrangements: Children (daughter) Available  Help at Discharge: Family                         Home Equipment: Kasandra Knudsen - single point;Walker - 2 wheels          Prior Functioning/Environment Level of Independence: Independent with assistive device(s)        Comments: RW and cane for mobility. Independent with BADL.        OT Problem List:     OT Treatment/Interventions:      OT Goals(Current goals can be found in the care plan section) Acute Rehab OT Goals Patient Stated Goal: rehab before home OT Goal Formulation: All assessment and education complete, DC therapy  OT Frequency:     Barriers to D/C:            Co-evaluation PT/OT/SLP Co-Evaluation/Treatment: Yes Reason for Co-Treatment: For patient/therapist safety;Complexity of the patient's impairments (multi-system involvement)   OT goals addressed during session: Other (comment) (functional mobility)      End of Session Equipment Utilized During Treatment: Gait belt;Rolling walker Nurse Communication: Weight bearing status;Mobility status  Activity Tolerance: Patient tolerated treatment well Patient left: in chair;with call bell/phone within reach;with family/visitor present   Time: 1140-1205 OT Time Calculation (min): 25 min Charges:  OT General Charges $OT Visit: 1 Procedure OT Evaluation $OT Eval Moderate Complexity: 1 Procedure G-Codes:     Binnie Kand M.S., OTR/L Pager: [phone]  11/04/2015, 12:34 PM

## 2015-11-05 LAB — GLUCOSE, CAPILLARY
GLUCOSE-CAPILLARY: 202 mg/dL — AB (ref 65–99)
GLUCOSE-CAPILLARY: 310 mg/dL — AB (ref 65–99)
GLUCOSE-CAPILLARY: 292 mg/dL — AB (ref 65–99)
Glucose-Capillary: 231 mg/dL — ABNORMAL HIGH (ref 65–99)

## 2015-11-05 LAB — BASIC METABOLIC PANEL
ANION GAP: 10 (ref 5–15)
Anion gap: 7 (ref 5–15)
BUN: 32 mg/dL — ABNORMAL HIGH (ref 6–20)
BUN: 32 mg/dL — ABNORMAL HIGH (ref 6–20)
CALCIUM: 7.7 mg/dL — AB (ref 8.9–10.3)
CO2: 25 mmol/L (ref 22–32)
CO2: 24 mmol/L (ref 22–32)
CREATININE: 1.56 mg/dL — AB (ref 0.44–1.00)
Calcium: 8 mg/dL — ABNORMAL LOW (ref 8.9–10.3)
Chloride: 95 mmol/L — ABNORMAL LOW (ref 101–111)
Chloride: 93 mmol/L — ABNORMAL LOW (ref 101–111)
Creatinine, Ser: 1.61 mg/dL — ABNORMAL HIGH (ref 0.44–1.00)
GFR calc Af Amer: 33 mL/min — ABNORMAL LOW (ref >60)
GFR calc Af Amer: 32 mL/min — ABNORMAL LOW (ref >60)
GFR, EST NON AFRICAN AMERICAN: 29 mL/min — AB (ref >60)
GFR, EST NON AFRICAN AMERICAN: 27 mL/min — AB (ref >60)
Glucose, Bld: 214 mg/dL — ABNORMAL HIGH (ref 65–99)
Glucose, Bld: 270 mg/dL — ABNORMAL HIGH (ref 65–99)
POTASSIUM: 4.5 mmol/L (ref 3.5–5.1)
Potassium: 4.2 mmol/L (ref 3.5–5.1)
SODIUM: 127 mmol/L — AB (ref 135–145)
SODIUM: 127 mmol/L — AB (ref 135–145)

## 2015-11-05 LAB — CBC
HCT: 24.9 % — ABNORMAL LOW (ref 36.0–46.0)
HEMATOCRIT: 27.8 % — AB (ref 36.0–46.0)
HEMOGLOBIN: 9.5 g/dL — AB (ref 12.0–15.0)
Hemoglobin: 8.8 g/dL — ABNORMAL LOW (ref 12.0–15.0)
MCH: 32.1 pg (ref 26.0–34.0)
MCH: 31.4 pg (ref 26.0–34.0)
MCHC: 35.3 g/dL (ref 30.0–36.0)
MCHC: 34.2 g/dL (ref 30.0–36.0)
MCV: 90.9 fL (ref 78.0–100.0)
MCV: 91.7 fL (ref 78.0–100.0)
PLATELETS: 181 10*3/uL (ref 150–400)
Platelets: 218 10*3/uL (ref 150–400)
RBC: 2.74 MIL/uL — ABNORMAL LOW (ref 3.87–5.11)
RBC: 3.03 MIL/uL — ABNORMAL LOW (ref 3.87–5.11)
RDW: 17 % — ABNORMAL HIGH (ref 11.5–15.5)
RDW: 16.8 % — AB (ref 11.5–15.5)
WBC: 13.7 10*3/uL — ABNORMAL HIGH (ref 4.0–10.5)
WBC: 14.2 10*3/uL — AB (ref 4.0–10.5)

## 2015-11-05 LAB — TYPE AND SCREEN
ABO/RH(D): A NEG
Antibody Screen: NEGATIVE
UNIT DIVISION: 0
Unit division: 0

## 2015-11-05 MED ORDER — FUROSEMIDE 10 MG/ML IJ SOLN
40.0000 mg | Freq: Once | INTRAMUSCULAR | Status: AC
  Administered 2015-11-05: 40 mg via INTRAVENOUS
  Filled 2015-11-05: qty 4

## 2015-11-05 MED ORDER — CEPHALEXIN 500 MG PO CAPS: 500.0000 mg | ORAL_CAPSULE | Freq: Two times a day (BID) | ORAL | Status: DC

## 2015-11-05 MED ORDER — CEPHALEXIN 250 MG PO CAPS
250.0000 mg | ORAL_CAPSULE | Freq: Two times a day (BID) | ORAL | Status: DC
  Administered 2015-11-05 – 2015-11-07 (×5): 250 mg via ORAL
  Filled 2015-11-05 (×7): qty 1

## 2015-11-05 NOTE — Progress Notes (Addendum)
PROGRESS NOTE    [name]  WUJ:[id number] DOB: Nov 29, 1924 DOA: 11/01/2015 PCP: [name]., MD   Brief Narrative:  Kathleen [name] is a 82 y.o. 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 She denied LOC, palpitation, but complained of right hip pain. Xray confirmed mild displaced right intertroch Fx, EKG showed paced rhythm, CXR showed unusual pattern, but no infiltrate, Cr of 1.2, Hb of 11.5 g per dL, and INR of 1.5. EDP consulted Dr Romeo Apple, and hospitalist was asked to admit her for further Tx.    Assessment & Plan:   Principal Problem:   Hip fracture (HCC) Active Problems:   Hypertension   DM type 2 (diabetes mellitus, type 2) (HCC)   Hyperlipidemia   Coronary atherosclerosis, no significant obstructive lesions   Closed displaced intertrochanteric fracture of right femur (HCC)   Malnutrition of moderate degree  Right hip fracture Patient is s/p surgery on 10/21. Got up today. Doing better -PT recommending SNF  Anemia Likely blood loss from surgery. Patient with significant cardiac history. Transfusion threshold of 8. Up to 8.8 from 7.1 yesterday with 2u PRBCs. -repeat CBC this afternoon  Hyponatremia Slightly improved. Weight is up, so this may be secondary to heart failure rather than volume depletion. -IVF -recheck this afternoon  Diabetes mellitus Glimepiride at home -SSI  Chronic combined systolic and diastolic heart failure Patient takes lasix, metoprolol, metolazone prn weight >140lbs. Last EF 40%. Very difficult to assess fluid status. Weight is up, however, so this may be secondary to heart failure rather than volume depletion. -continue metoprolol -daily weight -in/out  Atrial fibrillation Takes amiodarone, metoprolol and Eliquis as outpatient -continue home meds  Hypertention -continue metoprolol,  amiodarone  Coronary atherosclerosis Hyperlipidemia -continue pravastatin  Bacturia secondary to klebsiella Difficult to determine if patient is symptomatic since she is on diuretics. No dysuria but has frequency and urgency. Will treat as UTI at this time. -Keflex   DVT prophylaxis: Eliquis Code Status: DNR Family Communication: Daughter at bedside Disposition Plan: Discharge to SNF today or tomorrow   Consultants:   Orthopedic surgery  Procedures:   Right hip pin placement (10/21)  Antimicrobials:  Keflex (10/23>>   Subjective: Patient reports no issues this morning. Had bad dreams last night  Objective: Vitals:   11/04/15 1500 11/04/15 1947 11/05/15 0453 11/05/15 0500  BP: (!) 125/52 (!) 116/40 (!) 112/45   Pulse: 71 73 70   Resp:  16 16   Temp: 97.8 F (36.6 C) 98.5 F (36.9 C) 98.7 F (37.1 C)   TempSrc: Oral Oral Oral   SpO2: 95% 92% 93%   Weight:  71.6 kg (157 lb 14.4 oz)  66.1 kg (145 lb 11.2 oz)  Height:        Intake/Output Summary (Last 24 hours) at 11/05/15 1034 Last data filed at 11/04/15 2237  Gross per 24 hour  Intake             1125 ml  Output                0 ml  Net             1125 ml   Filed Weights   11/01/15 2030 11/04/15 1947 11/05/15 0500  Weight: 62.6 kg (138 lb 1.6 oz) 71.6 kg (157 lb 14.4 oz) 66.1 kg (145 lb 11.2 oz)  Examination:  General exam: Appears calm and comfortable. Sitting in chair. Respiratory system: Clear to auscultation. Respiratory effort normal. Cardiovascular system: S1 & S2 heard, RRR. No murmurs, rubs, gallops or clicks. Gastrointestinal system: Abdomen is nondistended, soft and nontender. Normal bowel sounds heard. Central nervous system: Alert and oriented. No focal neurological deficits. Extremities: No edema. No calf tenderness Skin: No cyanosis. No rashes Psychiatry: Judgement and insight appear normal. Mood & affect appropriate.     Data Reviewed: I have personally reviewed following labs  and imaging studies  CBC:  Recent Labs Lab 11/01/15 1658 11/02/15 0557 11/04/15 0318 11/05/15 0513  WBC 11.8* 9.5 12.9* 13.7*  NEUTROABS 10.1*  --   --   --   HGB 11.5* 10.3* 7.1* 8.8*  HCT 35.3* 31.4* 21.0* 24.9*  MCV 98.3 98.4 94.6 90.9  PLT 263 231 174 181   Basic Metabolic Panel:  Recent Labs Lab 11/01/15 1658 11/02/15 0557 11/04/15 0318 11/04/15 1817 11/05/15 0513  NA 132* 133* 126* 126* 127*  K 4.1 4.4 4.2 4.3 4.2  CL 96* 99* 94* 92* 95*  CO2 28 27 25 24 25   GLUCOSE 180* 214* 186* 234* 214*  BUN 25* 25* 23* 27* 32*  CREATININE 1.24* 1.29* 1.48* 1.58* 1.56*  CALCIUM 8.6* 8.3* 7.6* 7.8* 7.7*   GFR: Estimated Creatinine Clearance: 22.4 mL/min (by C-G formula based on SCr of 1.56 mg/dL (H)). Liver Function Tests:  Recent Labs Lab 11/01/15 1658  AST 24  ALT 22  ALKPHOS 120  BILITOT 0.8  PROT 6.7  ALBUMIN 3.4*   No results for input(s): LIPASE, AMYLASE in the last 168 hours. No results for input(s): AMMONIA in the last 168 hours. Coagulation Profile:  Recent Labs Lab 11/01/15 1658 11/02/15 0750  INR 1.56 1.59   Cardiac Enzymes: No results for input(s): CKTOTAL, CKMB, CKMBINDEX, TROPONINI in the last 168 hours. BNP (last 3 results) No results for input(s): PROBNP in the last 8760 hours. HbA1C: No results for input(s): HGBA1C in the last 72 hours. CBG:  Recent Labs Lab 11/03/15 1651 11/03/15 2209 11/04/15 0635 11/04/15 1649 11/05/15 0531  GLUCAP 194* 247* 187* 214* 202*   Lipid Profile: No results for input(s): CHOL, HDL, LDLCALC, TRIG, CHOLHDL, LDLDIRECT in the last 72 hours. Thyroid Function Tests: No results for input(s): TSH, T4TOTAL, FREET4, T3FREE, THYROIDAB in the last 72 hours. Anemia Panel: No results for input(s): VITAMINB12, FOLATE, FERRITIN, TIBC, IRON, RETICCTPCT in the last 72 hours. Sepsis Labs: No results for input(s): PROCALCITON, LATICACIDVEN in the last 168 hours.  Recent Results (from the past 240 hour(s))  Urine  culture     Status: Abnormal   Collection Time: 11/01/15  5:28 PM  Result Value Ref Range Status   Specimen Description URINE, CATHETERIZED  Final   Special Requests Normal  Final   Culture >=100,000 COLONIES/mL KLEBSIELLA PNEUMONIAE (A)  Final   Report Status 11/04/2015 FINAL  Final   Organism ID, Bacteria KLEBSIELLA PNEUMONIAE (A)  Final      Susceptibility   Klebsiella pneumoniae - MIC*    AMPICILLIN >=32 RESISTANT Resistant     CEFAZOLIN <=4 SENSITIVE Sensitive     CEFTRIAXONE <=1 SENSITIVE Sensitive     CIPROFLOXACIN <=0.25 SENSITIVE Sensitive     GENTAMICIN <=1 SENSITIVE Sensitive     IMIPENEM <=0.25 SENSITIVE Sensitive     NITROFURANTOIN 64 INTERMEDIATE Intermediate     TRIMETH/SULFA <=20 SENSITIVE Sensitive     AMPICILLIN/SULBACTAM 4 SENSITIVE Sensitive     PIP/TAZO <=4 SENSITIVE Sensitive  Extended ESBL NEGATIVE Sensitive     * >=100,000 COLONIES/mL KLEBSIELLA PNEUMONIAE  Surgical pcr screen     Status: Abnormal   Collection Time: 11/02/15  2:25 PM  Result Value Ref Range Status   MRSA, PCR NEGATIVE NEGATIVE Final   Staphylococcus aureus POSITIVE (A) NEGATIVE Final    Comment:        The Xpert SA Assay (FDA approved for NASAL specimens in patients over 30 years of age), is one component of a comprehensive surveillance program.  Test performance has been validated by [facility] for patients greater than or equal to 53 year old. It is not intended to diagnose infection nor to guide or monitor treatment. RESULT CALLED TO, READ BACK BY AND VERIFIED WITH: BULLINS,L. AT 1711 ON 11/02/2015 BY EVA          Radiology Studies: No results found.      Scheduled Meds: . amiodarone  100 mg Oral Daily  . apixaban  2.5 mg Oral BID  . chlorhexidine  60 mL Topical Once  . Chlorhexidine Gluconate Cloth  6 each Topical Q0600  . feeding supplement (ENSURE ENLIVE)  237 mL Oral BID BM  . insulin aspart  0-5 Units Subcutaneous QHS  . insulin aspart  0-9 Units  Subcutaneous TID WC  . lubiprostone  24 mcg Oral BID WC  . metoprolol succinate  75 mg Oral Daily  . mupirocin ointment  1 application Nasal BID  . pantoprazole  40 mg Oral Daily  . povidone-iodine  2 application Topical Once  . pravastatin  40 mg Oral q1800   Continuous Infusions: . sodium chloride Stopped (11/04/15 2159)  . lactated ringers 1 mL (11/04/15 0625)     LOS: 4 days     Jacquelin Hawking [facility] 11/05/2015, 10:34 AM Pager: [phone]  If 7PM-7AM, please contact night-coverage [email or web address] Password [email or web address] 11/05/2015, 10:34 AM

## 2015-11-05 NOTE — Consult Note (Signed)
[facility] CM Primary Care Navigator  11/05/2015  Kathleen [name] 1924-01-27 [id number]  Met with patient and daughter Kathleen [name]) at the bedside to identify possible discharge needs. Patient shared that she fell and broke her hip when she attempted to clean their kitchen ceiling fan which led to this admission/ surgery.  Patient confirms Dr. Redmond School with [facility] as her primary care provider.    Patient shared using [employer] Linna Hoff) to obtain medications with no problem. Daughter manages patient's medications using  "checklist" system as stated.  Daughter lives with patient and provides transportation to her doctors' appointments. She is the primary caregiver for patient at home and patient has grandchildren and grandnieces that are able to help out with her needs as well.  Patient will be discharged to skilled nursing facility for short term rehabilitation before going home. [facility] is their preference.  Patient and daughter voiced understanding to call primary care provider's office, once discharged home for a post discharge follow-up appointment within a week or sooner if needs arise.  Both denied further needs or concerns at this time.    For additional questions please contact:  Edwena Felty A. Cohl Behrens, BSN, RN-BC [facility] PRIMARY CARE Navigator Cell: [phone]

## 2015-11-05 NOTE — Progress Notes (Signed)
Physical Therapy Treatment Patient Details Name: Kathleen [name] MRN: [id number] DOB: [birth year] Today's Date: 11/05/2015    History of Present Illness Kathleen B Southardis a 83 y.o.femalewith hx of PAF, on Eliquis, s/p pacemaker, hx of CAD s/p prior CABG, now believe to have non ischemic cardiomyopathy, with diastolic CHF, DM, HTN, HLD, fell and broke her right hip. Underwent pinning 11/03/15    PT Comments    Pt Progressing slowly, was able to transfer bed to [facility] and then BSC to recliner but requiring max A +2 due to fatigue and weakness. PT will continue to follow.   Follow Up Recommendations  SNF;Supervision/Assistance - 24 hour     Equipment Recommendations  None recommended by PT    Recommendations for Other Services       Precautions / Restrictions Precautions Precautions: Fall Precaution Comments: has had multiple falls Restrictions Weight Bearing Restrictions: No RLE Weight Bearing: Weight bearing as tolerated    Mobility  Bed Mobility Overal bed mobility: Needs Assistance;+2 for physical assistance Bed Mobility: Rolling;Sidelying to Sit Rolling: Mod assist;+2 for physical assistance Sidelying to sit: Mod assist;+2 for physical assistance       General bed mobility comments: pt reached for right rail with LUE upon cueing, mod A for legs off bed and mod A at trunk to sit up  Transfers Overall transfer level: Needs assistance Equipment used: Rolling walker (2 wheeled) Transfers: Sit to/from [employer] Sit to Stand: +2 physical assistance;Max assist Stand pivot transfers: +2 physical assistance;Max assist       General transfer comment: Max A +2 for fwd wt shift as well as power up from bed and then [facility]. No walker used for pivot to BSC, max facilitation of hips to left and pt maintain trunk in flexion despite verbal and tactile cues. [facility] she was able to achieve slightly taller posture. RW used for support during  perineal care. Pt can maintain standing only about 1 min before needing to sit due to fatigue.   Ambulation/Gait             General Gait Details: unable to step   Stairs            Wheelchair Mobility    Modified Rankin (Stroke Patients Only)       Balance Overall balance assessment: Needs assistance Sitting-balance support: Bilateral upper extremity supported;Feet supported Sitting balance-Leahy Scale: Fair     Standing balance support: Bilateral upper extremity supported Standing balance-Leahy Scale: Zero Standing balance comment: pt has difficulty pushing through UE's effectively to maintain standing                    Cognition Arousal/Alertness: Awake/alert Behavior During Therapy: WFL for tasks assessed/performed Overall Cognitive Status: History of cognitive impairments - at baseline       Memory: Decreased short-term memory (delayed responses at baseline)              Exercises General Exercises - Lower Extremity Ankle Circles/Pumps: AROM;Both;10 reps;Supine Quad Sets: AROM;Both;10 reps;Seated Long Arc Quad: Right;10 reps;AAROM;Seated Hip ABduction/ADduction: AAROM;Right;10 reps;Seated    General Comments        Pertinent Vitals/Pain Pain Assessment: Faces Faces Pain Scale: Hurts little more Pain Location: right hip Pain Descriptors / Indicators: Sore Pain Intervention(s): Limited activity within patient's tolerance;Monitored during session    Home Living                      Prior Function  PT Goals (current goals can now be found in the care plan section) Acute Rehab PT Goals Patient Stated Goal: rehab then home PT Goal Formulation: With patient Time For Goal Achievement: 11/18/15 Potential to Achieve Goals: Fair Progress towards PT goals: Progressing toward goals    Frequency    Min 3X/week      PT Plan Current plan remains appropriate    Co-evaluation             End of Session  Equipment Utilized During Treatment: Gait belt Activity Tolerance: Patient limited by fatigue Patient left: in chair;with call bell/phone within reach;with family/visitor present     Time: 1132-1204 PT Time Calculation (min) (ACUTE ONLY): 32 min  Charges:  $Therapeutic Activity: 23-37 mins                    G Codes:     Lyanne CoVictoria Corrina Steffensen, PT  Acute Rehab Services  [phone]  Lyanne CoManess, Tiney Zipper 11/05/2015, 12:53 PM

## 2015-11-05 NOTE — Progress Notes (Signed)
   Subjective:  Patient reports pain as mild. Feels better after transfusion  Objective:   VITALS:   Vitals:   11/04/15 1500 11/04/15 1947 11/05/15 0453 11/05/15 0500  BP: (!) 125/52 (!) 116/40 (!) 112/45   Pulse: 71 73 70   Resp:  16 16   Temp: 97.8 F (36.6 C) 98.5 F (36.9 C) 98.7 F (37.1 C)   TempSrc: Oral Oral Oral   SpO2: 95% 92% 93%   Weight:  71.6 kg (157 lb 14.4 oz)  66.1 kg (145 lb 11.2 oz)  Height:        Neurologically intact Neurovascular intact Sensation intact distally Intact pulses distally Dorsiflexion/Plantar flexion intact Incision: dressing C/D/I and no drainage No cellulitis present Compartment soft   Lab Results  Component Value Date   WBC 13.7 (H) 11/05/2015   HGB 8.8 (L) 11/05/2015   HCT 24.9 (L) 11/05/2015   MCV 90.9 11/05/2015   PLT 181 11/05/2015     Assessment/Plan:  2 Days Post-Op   - SNF pending - stable from ortho stand point - up with PT  Glee Arvin 11/05/2015, 7:47 AM [phone]

## 2015-11-05 NOTE — Discharge Instructions (Signed)
Information on my medicine - ELIQUIS (apixaban)  This medication education was reviewed with me or my healthcare representative as part of my discharge preparation.  The pharmacist that spoke with me during my hospital stay was:  Herby Abraham, [facility]  Why was Eliquis prescribed for you? Eliquis was prescribed for you to reduce the risk of a blood clot forming that can cause a stroke if you have a medical condition called atrial fibrillation (a type of irregular heartbeat).  What do You need to know about Eliquis ? Take your Eliquis TWICE DAILY - one tablet in the morning and one tablet in the evening with or without food. If you have difficulty swallowing the tablet whole please discuss with your pharmacist how to take the medication safely.  Take Eliquis exactly as prescribed by your doctor and DO NOT stop taking Eliquis without talking to the doctor who prescribed the medication.  Stopping may increase your risk of developing a stroke.  Refill your prescription before you run out.  After discharge, you should have regular check-up appointments with your healthcare provider that is prescribing your Eliquis.  In the future your dose may need to be changed if your kidney function or weight changes by a significant amount or as you get older.  What do you do if you miss a dose? If you miss a dose, take it as soon as you remember on the same day and resume taking twice daily.  Do not take more than one dose of ELIQUIS at the same time to make up a missed dose.  Important Safety Information A possible side effect of Eliquis is bleeding. You should call your healthcare provider right away if you experience any of the following: ? Bleeding from an injury or your nose that does not stop. ? Unusual colored urine (red or dark brown) or unusual colored stools (red or black). ? Unusual bruising for unknown reasons. ? A serious fall or if you hit your head (even if there is no bleeding).  Some  medicines may interact with Eliquis and might increase your risk of bleeding or clotting while on Eliquis. To help avoid this, consult your healthcare provider or pharmacist prior to using any new prescription or non-prescription medications, including herbals, vitamins, non-steroidal anti-inflammatory drugs (NSAIDs) and supplements.  This website has more information on Eliquis (apixaban): http://www.eliquis.com/eliquis/home   1. Change dressings as needed 2. May shower but keep incisions covered and dry 3. Take lovenox to prevent blood clots 4. Take stool softeners as needed 5. Take pain meds as needed

## 2015-11-05 NOTE — Progress Notes (Signed)
Inpatient Diabetes Program Recommendations  AACE/ADA: New Consensus Statement on Inpatient Glycemic Control (2015)  Target Ranges:  Prepandial:   less than 140 mg/dL      Peak postprandial:   less than 180 mg/dL (1-2 hours)      Critically ill patients:  140 - 180 mg/dL   Lab Results  Component Value Date   GLUCAP 231 (H) 11/05/2015   HGBA1C 7.3 (H) 11/02/2015    Review of Glycemic Control  Diabetes history: DM 2 Outpatient Diabetes medications: Amaryl 0.5 mg prn Current orders for Inpatient glycemic control: Novolog Sensitive TID + HS scale  Inpatient Diabetes Program Recommendations:  Glucose in the high 100's in the 200's today. Please consider increasing correction scale to Novolog Moderate TID.  Thanks,  Christena Deem RN, MSN, [facility] Inpatient Diabetes Coordinator Team Pager [phone] (8a-5p)

## 2015-11-06 ENCOUNTER — Inpatient Hospital Stay (HOSPITAL_COMMUNITY): Payer: PPO

## 2015-11-06 ENCOUNTER — Encounter (HOSPITAL_COMMUNITY): Payer: Self-pay | Admitting: Orthopaedic Surgery

## 2015-11-06 DIAGNOSIS — N3000 Acute cystitis without hematuria: Secondary | ICD-10-CM

## 2015-11-06 DIAGNOSIS — E1122 Type 2 diabetes mellitus with diabetic chronic kidney disease: Secondary | ICD-10-CM

## 2015-11-06 DIAGNOSIS — N183 Chronic kidney disease, stage 3 (moderate): Secondary | ICD-10-CM

## 2015-11-06 LAB — BASIC METABOLIC PANEL
ANION GAP: 9 (ref 5–15)
BUN: 37 mg/dL — ABNORMAL HIGH (ref 6–20)
CHLORIDE: 92 mmol/L — AB (ref 101–111)
CO2: 26 mmol/L (ref 22–32)
Calcium: 7.9 mg/dL — ABNORMAL LOW (ref 8.9–10.3)
Creatinine, Ser: 1.44 mg/dL — ABNORMAL HIGH (ref 0.44–1.00)
GFR calc Af Amer: 36 mL/min — ABNORMAL LOW (ref >60)
GFR, EST NON AFRICAN AMERICAN: 31 mL/min — AB (ref >60)
Glucose, Bld: 147 mg/dL — ABNORMAL HIGH (ref 65–99)
POTASSIUM: 4.4 mmol/L (ref 3.5–5.1)
SODIUM: 127 mmol/L — AB (ref 135–145)

## 2015-11-06 LAB — HEMOGLOBIN AND HEMATOCRIT, BLOOD
HEMATOCRIT: 24.9 % — AB (ref 36.0–46.0)
HEMOGLOBIN: 8.5 g/dL — AB (ref 12.0–15.0)

## 2015-11-06 LAB — CBC
HCT: 24.1 % — ABNORMAL LOW (ref 36.0–46.0)
HEMOGLOBIN: 8.3 g/dL — AB (ref 12.0–15.0)
MCH: 32 pg (ref 26.0–34.0)
MCHC: 34.4 g/dL (ref 30.0–36.0)
MCV: 93.1 fL (ref 78.0–100.0)
PLATELETS: 200 10*3/uL (ref 150–400)
RBC: 2.59 MIL/uL — AB (ref 3.87–5.11)
RDW: 16.3 % — ABNORMAL HIGH (ref 11.5–15.5)
WBC: 12.2 10*3/uL — AB (ref 4.0–10.5)

## 2015-11-06 LAB — GLUCOSE, CAPILLARY
GLUCOSE-CAPILLARY: 186 mg/dL — AB (ref 65–99)
Glucose-Capillary: 247 mg/dL — ABNORMAL HIGH (ref 65–99)
Glucose-Capillary: 289 mg/dL — ABNORMAL HIGH (ref 65–99)
Glucose-Capillary: 188 mg/dL — ABNORMAL HIGH (ref 65–99)

## 2015-11-06 MED ORDER — INSULIN ASPART 100 UNIT/ML ~~LOC~~ SOLN: 0.0000 [IU] | Freq: Every day | SUBCUTANEOUS | Status: DC

## 2015-11-06 MED ORDER — FUROSEMIDE 40 MG PO TABS
40.0000 mg | ORAL_TABLET | Freq: Every day | ORAL | Status: DC
  Administered 2015-11-07: 40 mg via ORAL
  Filled 2015-11-06: qty 1

## 2015-11-06 MED ORDER — FUROSEMIDE 10 MG/ML IJ SOLN
40.0000 mg | Freq: Once | INTRAMUSCULAR | Status: AC
Start: 2015-11-06 — End: 2015-11-06
  Administered 2015-11-06: 40 mg via INTRAVENOUS
  Filled 2015-11-06: qty 4

## 2015-11-06 MED ORDER — INSULIN ASPART 100 UNIT/ML ~~LOC~~ SOLN
0.0000 [IU] | Freq: Three times a day (TID) | SUBCUTANEOUS | Status: DC
  Administered 2015-11-06: 8 [IU] via SUBCUTANEOUS
  Administered 2015-11-06 – 2015-11-07 (×2): 5 [IU] via SUBCUTANEOUS
  Administered 2015-11-07 (×2): 3 [IU] via SUBCUTANEOUS

## 2015-11-06 NOTE — Progress Notes (Signed)
PROGRESS NOTE    Kathleen [name]  ZOX:[id number] DOB: 1924-07-10 DOA: 11/01/2015 PCP: [name]., MD   Brief Narrative:  Kathleen [name] is a 82 y.o. 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 She denied LOC, palpitation, but complained of right hip pain. Xray confirmed mild displaced right intertroch Fx, EKG showed paced rhythm, CXR showed unusual pattern, but no infiltrate, Cr of 1.2, Hb of 11.5 g per dL, and INR of 1.5. EDP consulted Dr Romeo Apple, and hospitalist was asked to admit her for further Tx.    Assessment & Plan:   Principal Problem:   Hip fracture (HCC) Active Problems:   Hypertension   DM type 2 (diabetes mellitus, type 2) (HCC)   Hyperlipidemia   Coronary atherosclerosis, no significant obstructive lesions   Closed displaced intertrochanteric fracture of right femur (HCC)   Malnutrition of moderate degree  Right hip fracture Patient is s/p surgery on 10/21. Got up today. Doing better -PT recommending SNF -ortho follow-up  Anemia Likely blood loss from surgery in addition to chronic anemia. Patient with significant cardiac history. Transfusion threshold of 8. S/p 2u PRBCs. Hemoglobin trended down today. No evidence of bleeding. No bowel movement yet. -repeat H&H this afternoon. Transfuse if <8  Hyponatremia Slightly improved. Weight is up, so this may be secondary to heart failure rather than volume depletion. Responded well to IV lasix -one more dose of IV lasix today, resume home regimen in AM  Diabetes mellitus Glimepiride at home -SSI  Chronic combined systolic and diastolic heart failure Patient takes lasix, metoprolol, metolazone prn weight >140lbs. Last EF 40%. Very difficult to assess fluid status. Weight is up, however, so this may be secondary to heart failure rather than volume depletion. -continue  metoprolol -daily weight -in/out  Atrial fibrillation Takes amiodarone, metoprolol and Eliquis as outpatient -continue home amiodarone and metoprolol -continue Eliquis  Hypertention -continue metoprolol, amiodarone  Coronary atherosclerosis Hyperlipidemia -continue pravastatin  Bacturia secondary to klebsiella Difficult to determine if patient is symptomatic since she is on diuretics. No dysuria but has frequency and urgency. Will treat as UTI at this time. -Continue Keflex (adjusted for renal function. As renal function improves, may need to readjust)  Abdominal distension Patient with bowel sounds and passing gas. Likely constipation. -continue Amitiza -KUB  DVT prophylaxis: Eliquis Code Status: DNR Family Communication: Daughter at bedside Disposition Plan: Discharge to SNF tomorrow   Consultants:   Orthopedic surgery  Procedures:   Right hip pin placement (10/21)  Antimicrobials:  Keflex (10/23>>   Subjective: Some hip pain. No chest pain, dyspnea, orthopnea or abdominal pain. Passing gas. No bowel movement.  Objective: Vitals:   11/05/15 0500 11/05/15 1300 11/05/15 2007 11/06/15 0412  BP:  (!) 117/43 (!) 119/42 (!) 120/48  Pulse:  71 71   Resp:   18 18  Temp:  98.7 F (37.1 C) 99.3 F (37.4 C) 98.1 F (36.7 C)  TempSrc:  Oral Oral Oral  SpO2:  95% 96% 97%  Weight: 66.1 kg (145 lb 11.2 oz)   70.4 kg (155 lb 3.2 oz)  Height:        Intake/Output Summary (Last 24 hours) at 11/06/15 1042 Last data filed at 11/06/15 0948  Gross per 24 hour  Intake              240 ml  Output  201 ml  Net               39 ml   Filed Weights   11/04/15 1947 11/05/15 0500 11/06/15 0412  Weight: 71.6 kg (157 lb 14.4 oz) 66.1 kg (145 lb 11.2 oz) 70.4 kg (155 lb 3.2 oz)    Examination:  General exam: Appears calm and comfortable. Sitting in chair. Respiratory system: Clear to auscultation. Respiratory effort normal. Cardiovascular system: S1 & S2  heard, RRR. No murmurs, rubs, gallops or clicks. Gastrointestinal system: Abdomen is nondistended, soft and nontender. Normal bowel sounds heard. Central nervous system: Alert and oriented. No focal neurological deficits. Extremities: No edema. No calf tenderness Skin: No cyanosis. No rashes Psychiatry: Judgement and insight appear normal. Mood & affect appropriate.     Data Reviewed: I have personally reviewed following labs and imaging studies  CBC:  Recent Labs Lab 11/01/15 1658 11/02/15 0557 11/04/15 0318 11/05/15 0513 11/05/15 1326 11/06/15 0744  WBC 11.8* 9.5 12.9* 13.7* 14.2* 12.2*  NEUTROABS 10.1*  --   --   --   --   --   HGB 11.5* 10.3* 7.1* 8.8* 9.5* 8.3*  HCT 35.3* 31.4* 21.0* 24.9* 27.8* 24.1*  MCV 98.3 98.4 94.6 90.9 91.7 93.1  PLT 263 231 174 181 218 200   Basic Metabolic Panel:  Recent Labs Lab 11/04/15 0318 11/04/15 1817 11/05/15 0513 11/05/15 1326 11/06/15 0744  NA 126* 126* 127* 127* 127*  K 4.2 4.3 4.2 4.5 4.4  CL 94* 92* 95* 93* 92*  CO2 25 24 25 24 26   GLUCOSE 186* 234* 214* 270* 147*  BUN 23* 27* 32* 32* 37*  CREATININE 1.48* 1.58* 1.56* 1.61* 1.44*  CALCIUM 7.6* 7.8* 7.7* 8.0* 7.9*   GFR: Estimated Creatinine Clearance: 26.6 mL/min (by C-G formula based on SCr of 1.44 mg/dL (H)). Liver Function Tests:  Recent Labs Lab 11/01/15 1658  AST 24  ALT 22  ALKPHOS 120  BILITOT 0.8  PROT 6.7  ALBUMIN 3.4*   No results for input(s): LIPASE, AMYLASE in the last 168 hours. No results for input(s): AMMONIA in the last 168 hours. Coagulation Profile:  Recent Labs Lab 11/01/15 1658 11/02/15 0750  INR 1.56 1.59   Cardiac Enzymes: No results for input(s): CKTOTAL, CKMB, CKMBINDEX, TROPONINI in the last 168 hours. BNP (last 3 results) No results for input(s): PROBNP in the last 8760 hours. HbA1C: No results for input(s): HGBA1C in the last 72 hours. CBG:  Recent Labs Lab 11/05/15 0531 11/05/15 1121 11/05/15 1627 11/05/15 2208  11/06/15 0553  GLUCAP 202* 231* 310* 292* 186*   Lipid Profile: No results for input(s): CHOL, HDL, LDLCALC, TRIG, CHOLHDL, LDLDIRECT in the last 72 hours. Thyroid Function Tests: No results for input(s): TSH, T4TOTAL, FREET4, T3FREE, THYROIDAB in the last 72 hours. Anemia Panel: No results for input(s): VITAMINB12, FOLATE, FERRITIN, TIBC, IRON, RETICCTPCT in the last 72 hours. Sepsis Labs: No results for input(s): PROCALCITON, LATICACIDVEN in the last 168 hours.  Recent Results (from the past 240 hour(s))  Urine culture     Status: Abnormal   Collection Time: 11/01/15  5:28 PM  Result Value Ref Range Status   Specimen Description URINE, CATHETERIZED  Final   Special Requests Normal  Final   Culture >=100,000 COLONIES/mL KLEBSIELLA PNEUMONIAE (A)  Final   Report Status 11/04/2015 FINAL  Final   Organism ID, Bacteria KLEBSIELLA PNEUMONIAE (A)  Final      Susceptibility   Klebsiella pneumoniae - MIC*    AMPICILLIN >=  32 RESISTANT Resistant     CEFAZOLIN <=4 SENSITIVE Sensitive     CEFTRIAXONE <=1 SENSITIVE Sensitive     CIPROFLOXACIN <=0.25 SENSITIVE Sensitive     GENTAMICIN <=1 SENSITIVE Sensitive     IMIPENEM <=0.25 SENSITIVE Sensitive     NITROFURANTOIN 64 INTERMEDIATE Intermediate     TRIMETH/SULFA <=20 SENSITIVE Sensitive     AMPICILLIN/SULBACTAM 4 SENSITIVE Sensitive     PIP/TAZO <=4 SENSITIVE Sensitive     Extended ESBL NEGATIVE Sensitive     * >=100,000 COLONIES/mL KLEBSIELLA PNEUMONIAE  Surgical pcr screen     Status: Abnormal   Collection Time: 11/02/15  2:25 PM  Result Value Ref Range Status   MRSA, PCR NEGATIVE NEGATIVE Final   Staphylococcus aureus POSITIVE (A) NEGATIVE Final    Comment:        The Xpert SA Assay (FDA approved for NASAL specimens in patients over 80 years of age), is one component of a comprehensive surveillance program.  Test performance has been validated by [facility] for patients greater than or equal to 80 year old. It is not  intended to diagnose infection nor to guide or monitor treatment. RESULT CALLED TO, READ BACK BY AND VERIFIED WITH: BULLINS,L. AT 1711 ON 11/02/2015 BY EVA          Radiology Studies: No results found.      Scheduled Meds: . amiodarone  100 mg Oral Daily  . apixaban  2.5 mg Oral BID  . cephALEXin  250 mg Oral Q12H  . chlorhexidine  60 mL Topical Once  . Chlorhexidine Gluconate Cloth  6 each Topical Q0600  . feeding supplement (ENSURE ENLIVE)  237 mL Oral BID BM  . furosemide  40 mg Intravenous Once  . [START ON 11/07/2015] furosemide  40 mg Oral Daily  . insulin aspart  0-15 Units Subcutaneous TID WC  . insulin aspart  0-5 Units Subcutaneous QHS  . lubiprostone  24 mcg Oral BID WC  . metoprolol succinate  75 mg Oral Daily  . mupirocin ointment  1 application Nasal BID  . pantoprazole  40 mg Oral Daily  . povidone-iodine  2 application Topical Once  . pravastatin  40 mg Oral q1800   Continuous Infusions: . lactated ringers 1 mL (11/04/15 0625)     LOS: 5 days     Jacquelin Hawkingalph Toshia Larkin [facility] 11/06/2015, 10:42 AM Pager: [phone]  If 7PM-7AM, please contact night-coverage [email or web address] Password [email or web address] 11/06/2015, 10:42 AM

## 2015-11-06 NOTE — Progress Notes (Signed)
SW spoke with [facility] Admissions who states that this pt is welcomed to come to their facility upon discharge.   SW spoke with physician who states that he will have lab work done for this pt and that the projected discharge at this time will be for tomorrow.   Crista Curb, MSW [phone]

## 2015-11-07 ENCOUNTER — Inpatient Hospital Stay
Admission: RE | Admit: 2015-11-07 | Discharge: 2015-11-18 | Disposition: A | Discharge status: Other Acute Inpatient Hospital | Payer: PPO | Source: Ambulatory Visit | Attending: Internal Medicine | Admitting: Internal Medicine

## 2015-11-07 DIAGNOSIS — S72141D Displaced intertrochanteric fracture of right femur, subsequent encounter for closed fracture with routine healing: Secondary | ICD-10-CM | POA: Diagnosis not present

## 2015-11-07 DIAGNOSIS — E1122 Type 2 diabetes mellitus with diabetic chronic kidney disease: Secondary | ICD-10-CM | POA: Diagnosis not present

## 2015-11-07 DIAGNOSIS — I429 Cardiomyopathy, unspecified: Secondary | ICD-10-CM | POA: Diagnosis not present

## 2015-11-07 DIAGNOSIS — I10 Essential (primary) hypertension: Secondary | ICD-10-CM | POA: Diagnosis not present

## 2015-11-07 DIAGNOSIS — N179 Acute kidney failure, unspecified: Secondary | ICD-10-CM | POA: Diagnosis not present

## 2015-11-07 DIAGNOSIS — S72001A Fracture of unspecified part of neck of right femur, initial encounter for closed fracture: Secondary | ICD-10-CM | POA: Diagnosis not present

## 2015-11-07 DIAGNOSIS — Z23 Encounter for immunization: Secondary | ICD-10-CM | POA: Diagnosis not present

## 2015-11-07 DIAGNOSIS — M179 Osteoarthritis of knee, unspecified: Secondary | ICD-10-CM | POA: Diagnosis not present

## 2015-11-07 DIAGNOSIS — Z950 Presence of cardiac pacemaker: Secondary | ICD-10-CM | POA: Diagnosis not present

## 2015-11-07 DIAGNOSIS — Y92009 Unspecified place in unspecified non-institutional (private) residence as the place of occurrence of the external cause: Secondary | ICD-10-CM | POA: Diagnosis not present

## 2015-11-07 DIAGNOSIS — N183 Chronic kidney disease, stage 3 (moderate): Secondary | ICD-10-CM | POA: Diagnosis not present

## 2015-11-07 DIAGNOSIS — W1830XA Fall on same level, unspecified, initial encounter: Secondary | ICD-10-CM | POA: Diagnosis not present

## 2015-11-07 DIAGNOSIS — R262 Difficulty in walking, not elsewhere classified: Secondary | ICD-10-CM | POA: Diagnosis not present

## 2015-11-07 DIAGNOSIS — B961 Klebsiella pneumoniae [K. pneumoniae] as the cause of diseases classified elsewhere: Secondary | ICD-10-CM | POA: Diagnosis not present

## 2015-11-07 DIAGNOSIS — Z6822 Body mass index (BMI) 22.0-22.9, adult: Secondary | ICD-10-CM | POA: Diagnosis not present

## 2015-11-07 DIAGNOSIS — I2510 Atherosclerotic heart disease of native coronary artery without angina pectoris: Secondary | ICD-10-CM | POA: Diagnosis not present

## 2015-11-07 DIAGNOSIS — E440 Moderate protein-calorie malnutrition: Secondary | ICD-10-CM | POA: Diagnosis not present

## 2015-11-07 DIAGNOSIS — N390 Urinary tract infection, site not specified: Secondary | ICD-10-CM | POA: Diagnosis not present

## 2015-11-07 DIAGNOSIS — E119 Type 2 diabetes mellitus without complications: Secondary | ICD-10-CM | POA: Diagnosis not present

## 2015-11-07 DIAGNOSIS — E785 Hyperlipidemia, unspecified: Secondary | ICD-10-CM | POA: Diagnosis not present

## 2015-11-07 DIAGNOSIS — I428 Other cardiomyopathies: Secondary | ICD-10-CM | POA: Diagnosis not present

## 2015-11-07 DIAGNOSIS — J449 Chronic obstructive pulmonary disease, unspecified: Secondary | ICD-10-CM | POA: Diagnosis not present

## 2015-11-07 DIAGNOSIS — M4800 Spinal stenosis, site unspecified: Secondary | ICD-10-CM | POA: Diagnosis not present

## 2015-11-07 DIAGNOSIS — Z66 Do not resuscitate: Secondary | ICD-10-CM | POA: Diagnosis not present

## 2015-11-07 DIAGNOSIS — M25559 Pain in unspecified hip: Secondary | ICD-10-CM | POA: Diagnosis not present

## 2015-11-07 DIAGNOSIS — I5042 Chronic combined systolic (congestive) and diastolic (congestive) heart failure: Secondary | ICD-10-CM | POA: Diagnosis not present

## 2015-11-07 DIAGNOSIS — G587 Mononeuritis multiplex: Secondary | ICD-10-CM | POA: Diagnosis not present

## 2015-11-07 DIAGNOSIS — M6281 Muscle weakness (generalized): Secondary | ICD-10-CM | POA: Diagnosis not present

## 2015-11-07 DIAGNOSIS — W1789XA Other fall from one level to another, initial encounter: Secondary | ICD-10-CM | POA: Diagnosis not present

## 2015-11-07 DIAGNOSIS — I130 Hypertensive heart and chronic kidney disease with heart failure and stage 1 through stage 4 chronic kidney disease, or unspecified chronic kidney disease: Secondary | ICD-10-CM | POA: Diagnosis not present

## 2015-11-07 DIAGNOSIS — I420 Dilated cardiomyopathy: Secondary | ICD-10-CM | POA: Diagnosis not present

## 2015-11-07 DIAGNOSIS — R55 Syncope and collapse: Secondary | ICD-10-CM | POA: Diagnosis not present

## 2015-11-07 DIAGNOSIS — E871 Hypo-osmolality and hyponatremia: Secondary | ICD-10-CM | POA: Diagnosis not present

## 2015-11-07 DIAGNOSIS — Z7901 Long term (current) use of anticoagulants: Secondary | ICD-10-CM | POA: Diagnosis not present

## 2015-11-07 DIAGNOSIS — D649 Anemia, unspecified: Secondary | ICD-10-CM | POA: Diagnosis not present

## 2015-11-07 DIAGNOSIS — K5900 Constipation, unspecified: Secondary | ICD-10-CM | POA: Diagnosis not present

## 2015-11-07 DIAGNOSIS — Z7984 Long term (current) use of oral hypoglycemic drugs: Secondary | ICD-10-CM | POA: Diagnosis not present

## 2015-11-07 DIAGNOSIS — K219 Gastro-esophageal reflux disease without esophagitis: Secondary | ICD-10-CM | POA: Diagnosis not present

## 2015-11-07 DIAGNOSIS — R293 Abnormal posture: Secondary | ICD-10-CM | POA: Diagnosis not present

## 2015-11-07 DIAGNOSIS — Z951 Presence of aortocoronary bypass graft: Secondary | ICD-10-CM | POA: Diagnosis not present

## 2015-11-07 DIAGNOSIS — R140 Abdominal distension (gaseous): Secondary | ICD-10-CM | POA: Diagnosis not present

## 2015-11-07 DIAGNOSIS — S72141A Displaced intertrochanteric fracture of right femur, initial encounter for closed fracture: Secondary | ICD-10-CM | POA: Diagnosis not present

## 2015-11-07 DIAGNOSIS — R279 Unspecified lack of coordination: Secondary | ICD-10-CM | POA: Diagnosis not present

## 2015-11-07 DIAGNOSIS — Z9181 History of falling: Secondary | ICD-10-CM | POA: Diagnosis not present

## 2015-11-07 DIAGNOSIS — I480 Paroxysmal atrial fibrillation: Secondary | ICD-10-CM | POA: Diagnosis not present

## 2015-11-07 LAB — HEMOGLOBIN AND HEMATOCRIT, BLOOD
HCT: 26.3 % — ABNORMAL LOW (ref 36.0–46.0)
Hemoglobin: 8.8 g/dL — ABNORMAL LOW (ref 12.0–15.0)

## 2015-11-07 LAB — BASIC METABOLIC PANEL
Anion gap: 9 (ref 5–15)
BUN: 38 mg/dL — AB (ref 6–20)
CHLORIDE: 93 mmol/L — AB (ref 101–111)
CO2: 27 mmol/L (ref 22–32)
CREATININE: 1.38 mg/dL — AB (ref 0.44–1.00)
Calcium: 8.1 mg/dL — ABNORMAL LOW (ref 8.9–10.3)
GFR calc Af Amer: 38 mL/min — ABNORMAL LOW (ref >60)
GFR calc non Af Amer: 33 mL/min — ABNORMAL LOW (ref >60)
Glucose, Bld: 185 mg/dL — ABNORMAL HIGH (ref 65–99)
Potassium: 4.6 mmol/L (ref 3.5–5.1)
SODIUM: 129 mmol/L — AB (ref 135–145)

## 2015-11-07 LAB — GLUCOSE, CAPILLARY
GLUCOSE-CAPILLARY: 160 mg/dL — AB (ref 65–99)
GLUCOSE-CAPILLARY: 175 mg/dL — AB (ref 65–99)
Glucose-Capillary: 201 mg/dL — ABNORMAL HIGH (ref 65–99)

## 2015-11-07 MED ORDER — METOPROLOL SUCCINATE ER 50 MG PO TB24: 50.0000 mg | ORAL_TABLET | Freq: Every day | ORAL | 0 refills | Status: DC

## 2015-11-07 MED ORDER — ENSURE ENLIVE PO LIQD: 237.0000 mL | Freq: Two times a day (BID) | ORAL | 12 refills | Status: DC

## 2015-11-07 MED ORDER — METOPROLOL SUCCINATE ER 50 MG PO TB24: 50.0000 mg | ORAL_TABLET | Freq: Every day | ORAL | Status: DC

## 2015-11-07 MED ORDER — FLEET ENEMA 7-19 GM/118ML RE ENEM
1200.0000 | ENEMA | Freq: Once | RECTAL | Status: AC
  Administered 2015-11-07: 133 via RECTAL
  Filled 2015-11-07: qty 1200

## 2015-11-07 MED ORDER — CEPHALEXIN 250 MG PO CAPS: 250.0000 mg | ORAL_CAPSULE | Freq: Two times a day (BID) | ORAL | 0 refills | Status: DC

## 2015-11-07 NOTE — Progress Notes (Signed)
Patient dischared to [facility] via  [facility].  Family present at time of discharge.

## 2015-11-07 NOTE — Progress Notes (Signed)
SW has arranged transportation for patient. SW made family, patient and nurse aware. SW spoke with/Tammi from [facility] Nursing who states that pt is welcomed to come now.   [facility]AR has been called.   Challis Crill [facility]

## 2015-11-07 NOTE — Progress Notes (Signed)
SW spoke with physician who states that pt should be able to d/c today. However, she states at this time the pt needs to have a BM before going to facility.  Crista Curb, MSW [phone] 11/07/2015 10:58 AM

## 2015-11-07 NOTE — Progress Notes (Signed)
Inpatient Diabetes Program Recommendations  AACE/ADA: New Consensus Statement on Inpatient Glycemic Control (2015)  Target Ranges:  Prepandial:   less than 140 mg/dL      Peak postprandial:   less than 180 mg/dL (1-2 hours)      Critically ill patients:  140 - 180 mg/dL   Results for ABRIENNE, TURRILL (MRN [id number]) as of 11/07/2015 09:39  Ref. Range 11/06/2015 11:08 11/06/2015 16:58 11/06/2015 21:58 11/07/2015 06:38  Glucose-Capillary Latest Ref Range: 65 - 99 mg/dL 322 (H) Ensure Suppl 025 (H) Ensure Suppl 188 (H) 160 (H)    Review of Glycemic Control  Diabetes history: DM2,  Outpatient Diabetes medications: Amaryl 0.5 mg prn Current orders for Inpatient glycemic control: Novolog correction 0-15 units TIDAC and 0-5 units QHS, Carb mod diet, Ensure Supplement  Inpatient Diabetes Program Recommendations: Please consider changing Ensure to Glucerna for supplements to meals.  Thank you,  Kristine Linea, RN, BSN Diabetes Coordinator Inpatient Diabetes Program [phone] (Team Pager)

## 2015-11-07 NOTE — Care Management Important Message (Signed)
Important Message  Patient Details  Name: Kathleen [name] MRN: [id number] Date of Birth: 1924/06/29   Medicare Important Message Given:  Yes    [name] 11/07/2015, 11:25 AM

## 2015-11-07 NOTE — Progress Notes (Signed)
SW spoke with nurse who states that pt is not currently ready for discharge. SW informed nurse to let her know when the pt is medically ready. Also, SW will continue to follow up.  Crista Curb, MSW [phone]

## 2015-11-07 NOTE — Clinical Social Work Placement (Signed)
   CLINICAL SOCIAL WORK PLACEMENT  NOTE  Date:  11/07/2015  Patient Details  Name: Kathleen [name] MRN: [id number] Date of Birth: Oct 30, 1924  Clinical Social Work is seeking post-discharge placement for this patient at the Skilled  Nursing Facility level of care (*CSW will initial, date and re-position this form in  chart as items are completed):  Yes   Patient/family provided with [place] Clinical Social Work Department's list of facilities offering this level of care within the geographic area requested by the patient (or if unable, by the patient's family).  Yes   Patient/family informed of their freedom to choose among providers that offer the needed level of care, that participate in Medicare, Medicaid or managed care program needed by the patient, have an available bed and are willing to accept the patient.  Yes   Patient/family informed of [place]'s ownership interest in [facility] and [facility], as well as of the fact that they are under no obligation to receive care at these facilities.  PASRR submitted to EDS on 11/02/15     PASRR number received on 11/02/15     Existing PASRR number confirmed on       FL2 transmitted to all facilities in geographic area requested by pt/family on 11/02/15     FL2 transmitted to all facilities within larger geographic area on       Patient informed that his/her managed care company has contracts with or will negotiate with certain facilities, including the following:        Yes   Patient/family informed of bed offers received.  Patient chooses bed at  [facility])     Physician recommends and patient chooses bed at      Patient to be transferred to   on 11/07/15.  Patient to be transferred to facility by  [facility])     Patient family notified on 11/07/15 of transfer.  Name of family member notified:   (daughter and grand daughter)     PHYSICIAN       Additional Comment:     _______________________________________________ Crista Curb R 11/07/2015, 4:07 PM

## 2015-11-07 NOTE — Discharge Summary (Signed)
Physician Discharge Summary  Kathleen [name] YQM:[id number] DOB: Jun 12, 1924 DOA: 11/01/2015  PCP: [name]., MD  Admit date: 11/01/2015 Discharge date: 11/07/2015  Admitted From: Home Disposition:  SNF   Recommendations for Outpatient Follow-up:  1. Follow up with PCP in 1-2 weeks 2. Please obtain BMP/CBC in one week 3. Please follow up on the following pending results: 4.     Discharge Condition: Stable  CODE STATUS: Full code  Diet recommendation: Heart Healthy   Brief/Interim Summary: Kathleen [name] is a 82 y.o. 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 She denied LOC, palpitation, but complained of right hip pain. Xray confirmed mild displaced right intertroch Fx, EKG showed paced rhythm, CXR showed unusual pattern, but no infiltrate, Cr of 1.2, Hb of 11.5 g per dL, and INR of 1.5. EDP consulted Dr Romeo Apple, and hospitalist was asked to admit her for further Tx.    Assessment & Plan:   Right hip fracture Patient is s/p surgery on 10/21. Got up today. Doing better -PT recommending SNF  Anemia Likely blood loss from surgery. Patient with significant cardiac history. Transfusion threshold of 8. Up to 8.8 from 7.1 yesterday with 2u PRBCs. -repeat hb prior to discharge   Hyponatremia Slightly improved. Weight is up, so this may be secondary to heart failure rather than volume depletion. -improving.   Diabetes mellitus Glimepiride at home -SSI  Chronic combined systolic and diastolic heart failure Patient takes lasix, metoprolol, metolazone prn weight >140lbs. Last EF 40%. Very difficult to assess fluid status. Weight is up, however, so this may be secondary to heart failure rather than volume depletion. -continue metoprolol -daily weight -in/out -resume oral lasix. Holding metolazone at discharge duet to AKI. Might  need to resume metholazine, follow renal function.   Atrial fibrillation Takes amiodarone, metoprolol and Eliquis as outpatient -continue home meds  Hypertention -continue metoprolol, amiodarone -reduce metoprolol to 50 mg daily. Due to soft SBP.   Coronary atherosclerosis Hyperlipidemia -continue pravastatin  Bacturia secondary to klebsiella Difficult to determine if patient is symptomatic since she is on diuretics. No dysuria but has frequency and urgency. Will treat as UTI at this time. -Keflex , needs 2 more days.   Constipation; fleet enema today '  Discharge Diagnoses:  Principal Problem:   Hip fracture (HCC) Active Problems:   Hypertension   DM type 2 (diabetes mellitus, type 2) (HCC)   Hyperlipidemia   Coronary atherosclerosis, no significant obstructive lesions   Closed displaced intertrochanteric fracture of right femur (HCC)   Malnutrition of moderate degree    Discharge Instructions  Discharge Instructions    Diet - low sodium heart healthy    Complete by:  As directed    Increase activity slowly    Complete by:  As directed    Weight bearing as tolerated    Complete by:  As directed        Medication List    STOP taking these medications   HYDROcodone-acetaminophen 5-325 MG tablet Commonly known as:  NORCO/VICODIN Replaced by:  HYDROcodone-acetaminophen 7.5-325 MG tablet   metolazone 2.5 MG tablet Commonly known as:  ZAROXOLYN   penicillin v potassium 500 MG tablet Commonly known as:  VEETID   potassium chloride 8 MEQ tablet Commonly known as:  KLOR-CON     TAKE these medications   amiodarone 200 MG tablet Commonly known  as:  PACERONE Take 0.5 tablets (100 mg total) by mouth daily.   ICAPS AREDS 2 Caps Take 1 capsule by mouth 2 (two) times daily.   CENTRUM SILVER PO Take 1 tablet by mouth every morning.   cephALEXin 250 MG capsule Commonly known as:  KEFLEX Take 1 capsule (250 mg total) by mouth every 12 (twelve) hours.   CO  Q 10 PO Take 1 tablet by mouth daily.   ELIQUIS 2.5 MG Tabs tablet Generic drug:  apixaban TAKE ONE TABLET BY MOUTH TWICE DAILY.   feeding supplement (ENSURE ENLIVE) Liqd Take 237 mLs by mouth 2 (two) times daily between meals.   furosemide 40 MG tablet Commonly known as:  LASIX Take 40 mg by mouth daily. 3 lbs weight gain in 24 hours extra half tablet ( 20 mg )   glimepiride 1 MG tablet Commonly known as:  AMARYL Take 0.5 mg by mouth as needed (for blood sugar levels over 120).   HYDROcodone-acetaminophen 7.5-325 MG tablet Commonly known as:  NORCO Take 1-2 tablets by mouth every 6 (six) hours as needed for moderate pain. Replaces:  HYDROcodone-acetaminophen 5-325 MG tablet   lubiprostone 24 MCG capsule Commonly known as:  AMITIZA Take 24 mcg by mouth 2 (two) times daily with a meal.   meclizine 25 MG tablet Commonly known as:  ANTIVERT Take 25 mg by mouth 3 (three) times daily as needed for dizziness.   metoprolol succinate 50 MG 24 hr tablet Commonly known as:  TOPROL-XL Take 1 tablet (50 mg total) by mouth daily. Take with or immediately following a meal. Start taking on:  11/08/2015 What changed:  See the new instructions.   nitroGLYCERIN 0.4 MG SL tablet Commonly known as:  NITROSTAT DISSOLVE 1 TABLET UNDER TONGUE EVERY 5 MINUTES UP TO 15 MIN FOR CHESTPAIN. IF NO RELIEF CALL 911.   omeprazole 20 MG capsule Commonly known as:  PRILOSEC TAKE ONE CAPSULE BY MOUTH DAILY.   ondansetron 4 MG tablet Commonly known as:  ZOFRAN Take 4 mg by mouth every 4 (four) hours as needed for nausea or vomiting.   pravastatin 40 MG tablet Commonly known as:  PRAVACHOL TAKE 1 TABLET BY MOUTH EACH EVENING.      Follow-up Information    Glee Arvin, MD In 2 weeks.   Specialty:  Orthopedic Surgery Why:  For suture removal, For wound re-check Contact information: [address] [place] [address] [phone]          Allergies  Allergen Reactions  .  Coumadin [Warfarin Sodium] Other (See Comments)    Caused Patient to Bleed Out.   . Meloxicam Other (See Comments)    Unknown  . Metformin And Related Other (See Comments)    Cannot have due to kidney function  . Morphine Itching    Not in right state of mind.   . Nabumetone Nausea And Vomiting  . Oxycodone Nausea And Vomiting  . Sulfonamide Derivatives Hives    Consultations:  Dr Roda Shutters   Procedures/Studies: Dg Chest 1 View  Result Date: 11/01/2015 CLINICAL DATA:  82 year old female with history of trauma from a fall with pain in the right hip and right femur. EXAM: CHEST 1 VIEW COMPARISON:  Chest x-ray 05/03/2015. FINDINGS: Diffuse peribronchial cuffing. Upper lobe volume loss as evidenced by upward retraction of the minor fissure and upward retraction of hilar structures bilaterally. Diffuse interstitial prominence, most evident throughout the mid to upper lungs, where there is a reticulonodular pattern which has progressively increased  in prominence over several prior examinations. Lower lungs are relatively clear. No pleural effusions. No evidence of pulmonary edema no pneumothorax. Heart size is mildly enlarged. Upper mediastinal contours are within normal limits. Atherosclerosis in the thoracic aorta. Left-sided biventricular pacemaker device in place with lead tips projecting over the expected location of the right atrium, right ventricle, and overlying the lateral wall the left ventricle via the coronary sinus and coronary veins. Bony thorax appears grossly intact. IMPRESSION: 1. No definite evidence to suggest significant acute traumatic injury to the thorax. Highly unusual appearance of the lungs, as above. This appearance can be seen in the setting of sarcoidosis. Other differential considerations include sequela of chronic hypersensitivity pneumonitis or sequela of chronic recurrent infections. These findings could be best evaluated with followup nonemergent high-resolution chest CT if  clinically appropriate. 2. Mild cardiomegaly. 3. Aortic atherosclerosis. Electronically Signed   By: Trudie Reedaniel  Entrikin M.D.   On: 11/01/2015 17:00   Dg Pelvis 1-2 Views  Result Date: 11/01/2015 CLINICAL DATA:  Fall.  Pain. EXAM: PELVIS - 1-2 VIEW COMPARISON:  03/12/2015. FINDINGS: A fracture along the greater trochanter of the right femur is noted. This is new from prior study of 03/12/2015. Right hip series suggested for further evaluation. Old right inferior pubic ramus fracture. Degenerative changes lumbar spine and both hips. Diffuse osteopenia. IMPRESSION: A fracture is noted along the greater trochanter of the right femur. This is new from prior study 03/12/2015. Right hip series suggested for further evaluation. Electronically Signed   By: Maisie Fushomas  Register   On: 11/01/2015 16:55   Ct Hip Right Wo Contrast  Result Date: 11/02/2015 CLINICAL DATA:  82 year old female right hip fracture. EXAM: CT OF THE RIGHT HIP WITHOUT CONTRAST TECHNIQUE: Multidetector CT imaging of the right hip was performed according to the standard protocol. Multiplanar CT image reconstructions were also generated. COMPARISON:  Radiograph dated 11/01/2015 FINDINGS: Bones/Joint/Cartilage There is a comminuted intratrochanteric fracture of the right femur with mild proximal migration and impaction of the femoral shaft and the femoral neck. There is associated mild varus angulation. Evaluation of the fracture is however limited due to advanced osteopenia. There is mild posterior displacement of the fracture fragment of the greater trochanter. There is no dislocation. Ligaments Suboptimally assessed by CT. Muscles and Tendons Mild edema of the right thigh musculature. No fluid collection or hematoma. Soft tissues No fluid collection or hematoma. A Foley catheter is noted within the bladder. There is scattered colonic diverticula. IMPRESSION: Comminuted intertrochanteric fracture of the right femur. Advanced osteopenia. Electronically  Signed   By: Elgie CollardArash  Radparvar M.D.   On: 11/02/2015 03:31   Dg Bone Density  Result Date: 10/11/2015 EXAM: DUAL X-RAY ABSORPTIOMETRY (DXA) FOR BONE MINERAL DENSITY IMPRESSION: Ordering Physician:  Dr. Elfredia NevinsLAWRENCE FUSCO, Your patient Prince SolianHALLIE Doster completed a BMD test on 10/11/2015 using the [employer] DXA System (software version: 14.10) manufactured by [employer]. The following summarizes the results of our evaluation. PATIENT BIOGRAPHICAL: Name: Daisy LazarSOUTHARD, Zyanna B Patient ID: [id number] Birth Date: 01-03-1925 Height: 65.0 in. Gender: Female Exam Date: 10/11/2015 Weight: 138.0 lbs. Indications: Advanced Age, Caucasian, Follow up Osteoporosis, Height Loss, History of Fracture (Adult), Low Calcium Intake, Post Menopausal Fractures: Pelvis, Wrist Treatments: Multivitamin DENSITOMETRY RESULTS: Site      Region      Measured Date Measured Age WHO Classification Young Adult T-score BMD         %Change vs. Previous Significant Change (*) AP Spine L1-L2 10[age]11/[age] 88.10 Osteoporosis -5.5 0.507 g/cm2 -35.7% Yes  AP Spine L1-L2 12/10/2010 83.9 Osteoporosis -3.1 0.789 g/cm2 - - DualFemur Total Right 10/11/2015 88.8 Osteoporosis -3.8 0.523 g/cm2 -16.5% Yes DualFemur Total Right 12/10/2010 83.9 Osteoporosis -3.0 0.626 g/cm2 - - ASSESSMENT: BMD as determined from AP Spine L1-L2 is 0.507 g/cm2 with a T-Score of -5.5. This patient is considered osteoporotic according to World Health Organization [facility]) criteria. Compared with the prior study on 12/10/10, the BMD of the lumbar spine/total hip show a statistically significant decrease. (L-3 and L-4 were excluded due to advanced degenerative changes.) World Health Organization [facility]) criteria for post-menopausal, Caucasian Women: Normal:       T-score at or above -1 SD Osteopenia:   T-score between -1 and -2.5 SD Osteoporosis: T-score at or below -2.5 SD RECOMMENDATIONS: National Osteoporosis Foundation recommends that FDA-approved medial therapies be considered in  postmenopausal women and men age 60 or older with a: 1. Hip or vertebral (clinical or morphometric) fracture. 2. T-Score of < -2.5 at the spine or hip. 3. Ten-year fracture probability by FRAX of 3% or greater for hip fracture or 20% or greater for major osteoporotic fracture. All treatment decisions require clinical judgment and consideration of individual patient factors, including patient preferences, co-morbidities, previous drug use, risk factors not captured in the FRAX model (e.g. falls, vitamin D deficiency, increased bone turnover, interval significant decline in bone density) and possible under-or over-estimation of fracture risk by FRAX. All patients should ensure an adequate intake of dietary calcium (1200 mg/d) and vitamin D (800 IU daily) unless contraindicated. FOLLOW-UP: People with diagnosed cases of osteoporosis or osteopenia should be regularly tested for bone mineral density. For patients eligible for Medicare, routine testing is allowed once every 2 years. Testing frequency can be increased for patients who have rapidly progressing disease, or for those who are receiving medical therapy to restore bone mass. I have reviewed this report, and agree with the above findings. [facility] Electronically Signed   By: Amie Portland M.D.   On: 10/11/2015 13:51   Dg Abd Portable 1v  Result Date: 11/06/2015 CLINICAL DATA:  Abdominal distension constipation. Recent hip surgery. EXAM: PORTABLE ABDOMEN - 1 VIEW COMPARISON:  None. FINDINGS: Single supine view of the abdomen and pelvis. Pacer. Cardiomegaly. No gaseous distention of bowel loops. Minimal motion degradation. Moderate amount of distal stool. Incompletely imaged right proximal femoral fixation. Probable cholecystectomy. Indeterminate calcification projecting lateral to the L4-5 level. IMPRESSION: Mildly motion degraded exam. No evidence of bowel obstruction. Colonic stool burden suggests constipation. Electronically Signed   By:  Jeronimo Greaves M.D.   On: 11/06/2015 11:09   Dg C-arm 1-60 Min  Result Date: 11/03/2015 CLINICAL DATA:  Operative imaging for right proximal femur fracture ORIF. EXAM: DG C-ARM 61-120 MIN; OPERATIVE RIGHT HIP WITH PELVIS COMPARISON:  11/01/2015. FINDINGS: Two screws are supported by an medullary rod reducing the intertrochanteric fracture into near anatomic alignment. The orthopedic hardware appears well seated well aligned. IMPRESSION: 1. Well-aligned right proximal femur intertrochanteric fracture following ORIF. Electronically Signed   By: Amie Portland M.D.   On: 11/03/2015 08:51   Dg Hip Operative Unilat W Or W/o Pelvis Right  Result Date: 11/03/2015 CLINICAL DATA:  Operative imaging for right proximal femur fracture ORIF. EXAM: DG C-ARM 61-120 MIN; OPERATIVE RIGHT HIP WITH PELVIS COMPARISON:  11/01/2015. FINDINGS: Two screws are supported by an medullary rod reducing the intertrochanteric fracture into near anatomic alignment. The orthopedic hardware appears well seated well aligned. IMPRESSION: 1. Well-aligned right proximal femur intertrochanteric fracture following ORIF. Electronically Signed  By: Amie Portland M.D.   On: 11/03/2015 08:51   Dg Femur Min 2 Views Right  Result Date: 11/01/2015 CLINICAL DATA:  Status post fall.  Right hip pain. EXAM: RIGHT FEMUR 2 VIEWS COMPARISON:  None. FINDINGS: Minimally displaced right intertrochanteric hip fracture. No other fracture or dislocation. Right knee arthroplasty. Peripheral vascular atherosclerotic disease. IMPRESSION: 1. Minimally displaced acute right intertrochanteric hip fracture. Electronically Signed   By: Elige Ko   On: 11/01/2015 16:53       Subjective: Feeling ok, denies dyspnea, abdominal pain.   Discharge Exam: Vitals:   11/07/15 0404 11/07/15 1028  BP: (!) 126/43 (!) 111/43  Pulse: 69 69  Resp: 16   Temp: 98.4 F (36.9 C)    Vitals:   11/06/15 1500 11/06/15 1944 11/07/15 0404 11/07/15 1028  BP: (!) 122/48 (!)  117/48 (!) 126/43 (!) 111/43  Pulse: 70 74 69 69  Resp: 18 16 16    Temp: 98.8 F (37.1 C) 98.6 F (37 C) 98.4 F (36.9 C)   TempSrc: Oral Oral Oral   SpO2: 95% 96% 93%   Weight:   70.7 kg (155 lb 13.8 oz)   Height:        General: Pt is alert, awake, not in acute distress Cardiovascular: RRR, S1/S2 +, no rubs, no gallops Respiratory: CTA bilaterally, no wheezing, no rhonchi Abdominal: Soft, NT, ND, bowel sounds + Extremities: no edema, no cyanosis    The results of significant diagnostics from this hospitalization (including imaging, microbiology, ancillary and laboratory) are listed below for reference.     Microbiology: Recent Results (from the past 240 hour(s))  Urine culture     Status: Abnormal   Collection Time: 11/01/15  5:28 PM  Result Value Ref Range Status   Specimen Description URINE, CATHETERIZED  Final   Special Requests Normal  Final   Culture >=100,000 COLONIES/mL KLEBSIELLA PNEUMONIAE (A)  Final   Report Status 11/04/2015 FINAL  Final   Organism ID, Bacteria KLEBSIELLA PNEUMONIAE (A)  Final      Susceptibility   Klebsiella pneumoniae - MIC*    AMPICILLIN >=32 RESISTANT Resistant     CEFAZOLIN <=4 SENSITIVE Sensitive     CEFTRIAXONE <=1 SENSITIVE Sensitive     CIPROFLOXACIN <=0.25 SENSITIVE Sensitive     GENTAMICIN <=1 SENSITIVE Sensitive     IMIPENEM <=0.25 SENSITIVE Sensitive     NITROFURANTOIN 64 INTERMEDIATE Intermediate     TRIMETH/SULFA <=20 SENSITIVE Sensitive     AMPICILLIN/SULBACTAM 4 SENSITIVE Sensitive     PIP/TAZO <=4 SENSITIVE Sensitive     Extended ESBL NEGATIVE Sensitive     * >=100,000 COLONIES/mL KLEBSIELLA PNEUMONIAE  Surgical pcr screen     Status: Abnormal   Collection Time: 11/02/15  2:25 PM  Result Value Ref Range Status   MRSA, PCR NEGATIVE NEGATIVE Final   Staphylococcus aureus POSITIVE (A) NEGATIVE Final    Comment:        The Xpert SA Assay (FDA approved for NASAL specimens in patients over 37 years of age), is one  component of a comprehensive surveillance program.  Test performance has been validated by [facility] for patients greater than or equal to 28 year old. It is not intended to diagnose infection nor to guide or monitor treatment. RESULT CALLED TO, READ BACK BY AND VERIFIED WITH: BULLINS,L. AT 1711 ON 11/02/2015 BY EVA      Labs: BNP (last 3 results)  Recent Labs  04/30/15 2050  BNP 589.0*   Basic Metabolic Panel:  Recent Labs Lab 11/04/15 1817 11/05/15 0513 11/05/15 1326 11/06/15 0744 11/07/15 0833  NA 126* 127* 127* 127* 129*  K 4.3 4.2 4.5 4.4 4.6  CL 92* 95* 93* 92* 93*  CO2 24 25 24 26 27   GLUCOSE 234* 214* 270* 147* 185*  BUN 27* 32* 32* 37* 38*  CREATININE 1.58* 1.56* 1.61* 1.44* 1.38*  CALCIUM 7.8* 7.7* 8.0* 7.9* 8.1*   Liver Function Tests:  Recent Labs Lab 11/01/15 1658  AST 24  ALT 22  ALKPHOS 120  BILITOT 0.8  PROT 6.7  ALBUMIN 3.4*   No results for input(s): LIPASE, AMYLASE in the last 168 hours. No results for input(s): AMMONIA in the last 168 hours. CBC:  Recent Labs Lab 11/01/15 1658 11/02/15 0557 11/04/15 0318 11/05/15 0513 11/05/15 1326 11/06/15 0744 11/06/15 1141  WBC 11.8* 9.5 12.9* 13.7* 14.2* 12.2*  --   NEUTROABS 10.1*  --   --   --   --   --   --   HGB 11.5* 10.3* 7.1* 8.8* 9.5* 8.3* 8.5*  HCT 35.3* 31.4* 21.0* 24.9* 27.8* 24.1* 24.9*  MCV 98.3 98.4 94.6 90.9 91.7 93.1  --   PLT 263 231 174 181 218 200  --    Cardiac Enzymes: No results for input(s): CKTOTAL, CKMB, CKMBINDEX, TROPONINI in the last 168 hours. BNP: Invalid input(s): POCBNP CBG:  Recent Labs Lab 11/06/15 0553 11/06/15 1108 11/06/15 1658 11/06/15 2158 11/07/15 0638  GLUCAP 186* 247* 289* 188* 160*   D-Dimer No results for input(s): DDIMER in the last 72 hours. Hgb A1c No results for input(s): HGBA1C in the last 72 hours. Lipid Profile No results for input(s): CHOL, HDL, LDLCALC, TRIG, CHOLHDL, LDLDIRECT in the last 72 hours. Thyroid  function studies No results for input(s): TSH, T4TOTAL, T3FREE, THYROIDAB in the last 72 hours.  Invalid input(s): FREET3 Anemia work up No results for input(s): VITAMINB12, FOLATE, FERRITIN, TIBC, IRON, RETICCTPCT in the last 72 hours. Urinalysis    Component Value Date/Time   COLORURINE YELLOW 11/01/2015 1728   APPEARANCEUR CLEAR 11/01/2015 1728   LABSPEC 1.015 11/01/2015 1728   PHURINE 6.0 11/01/2015 1728   GLUCOSEU NEGATIVE 11/01/2015 1728   HGBUR NEGATIVE 11/01/2015 1728   BILIRUBINUR NEGATIVE 11/01/2015 1728   KETONESUR NEGATIVE 11/01/2015 1728   PROTEINUR NEGATIVE 11/01/2015 1728   UROBILINOGEN 0.2 10/16/2013 0910   NITRITE NEGATIVE 11/01/2015 1728   LEUKOCYTESUR NEGATIVE 11/01/2015 1728   Sepsis Labs Invalid input(s): PROCALCITONIN,  WBC,  LACTICIDVEN Microbiology Recent Results (from the past 240 hour(s))  Urine culture     Status: Abnormal   Collection Time: 11/01/15  5:28 PM  Result Value Ref Range Status   Specimen Description URINE, CATHETERIZED  Final   Special Requests Normal  Final   Culture >=100,000 COLONIES/mL KLEBSIELLA PNEUMONIAE (A)  Final   Report Status 11/04/2015 FINAL  Final   Organism ID, Bacteria KLEBSIELLA PNEUMONIAE (A)  Final      Susceptibility   Klebsiella pneumoniae - MIC*    AMPICILLIN >=32 RESISTANT Resistant     CEFAZOLIN <=4 SENSITIVE Sensitive     CEFTRIAXONE <=1 SENSITIVE Sensitive     CIPROFLOXACIN <=0.25 SENSITIVE Sensitive     GENTAMICIN <=1 SENSITIVE Sensitive     IMIPENEM <=0.25 SENSITIVE Sensitive     NITROFURANTOIN 64 INTERMEDIATE Intermediate     TRIMETH/SULFA <=20 SENSITIVE Sensitive     AMPICILLIN/SULBACTAM 4 SENSITIVE Sensitive     PIP/TAZO <=4 SENSITIVE Sensitive     Extended ESBL NEGATIVE Sensitive     * >=  100,000 COLONIES/mL KLEBSIELLA PNEUMONIAE  Surgical pcr screen     Status: Abnormal   Collection Time: 11/02/15  2:25 PM  Result Value Ref Range Status   MRSA, PCR NEGATIVE NEGATIVE Final   Staphylococcus  aureus POSITIVE (A) NEGATIVE Final    Comment:        The Xpert SA Assay (FDA approved for NASAL specimens in patients over 93 years of age), is one component of a comprehensive surveillance program.  Test performance has been validated by [facility] for patients greater than or equal to 18 year old. It is not intended to diagnose infection nor to guide or monitor treatment. RESULT CALLED TO, READ BACK BY AND VERIFIED WITH: BULLINS,L. AT 1711 ON 11/02/2015 BY EVA      Time coordinating discharge: Over 30 minutes  SIGNED:   Alba Cory, MD  [facility] 11/07/2015, 10:50 AM Pager [phone]  If 7PM-7AM, please contact night-coverage [email or web address] Password [email or web address]

## 2015-11-07 NOTE — Progress Notes (Signed)
Physical Therapy Treatment Patient Details Name: Kathleen [name] MRN: [id number] DOB: 1924/01/22 Today's Date: 11/07/2015    History of Present Illness Kathleen [name] a 82 y.o.femalewith hx of PAF, on Eliquis, s/p pacemaker, hx of CAD s/p prior CABG, now believe to have non ischemic cardiomyopathy, with diastolic CHF, DM, HTN, HLD, fell and broke her right hip. Underwent pinning 11/03/15    PT Comments    Pt transferred from bed to chair and nurse arrived to give enema.  PTA and SPTA transferred patient back to bed.  Pt planned for d/c this pm.    Follow Up Recommendations  SNF;Supervision/Assistance - 24 hour     Equipment Recommendations  None recommended by PT    Recommendations for Other Services       Precautions / Restrictions Precautions Precautions: Fall Precaution Comments: has had multiple falls Restrictions Weight Bearing Restrictions: Yes RLE Weight Bearing: Weight bearing as tolerated    Mobility  Bed Mobility Overal bed mobility: Needs Assistance;+2 for physical assistance Bed Mobility: Sit to Supine;Supine to Sit     Supine to sit: Max assist;+2 for physical assistance Sit to supine: Max assist;+2 for physical assistance   General bed mobility comments: Pt required cues for reaching and assist with trunk and LE movement into and OOB.    Transfers Overall transfer level: Needs assistance   Transfers: Sit to/from Stand Sit to Stand: +2 physical assistance;Max assist;Mod assist         General transfer comment: Pt performed x2 transfers from bed, and recliner chair.  x2 from higher seated stedy plates.  Pt required decreased assist as seat height elevated.    Ambulation/Gait Ambulation/Gait assistance:  (remains unable.  )               Stairs            Wheelchair Mobility    Modified Rankin (Stroke Patients Only)       Balance Overall balance assessment: Needs assistance   Sitting balance-Leahy Scale: Fair        Standing balance-Leahy Scale: Poor Standing balance comment: Pt rests elbows on stedy frame with flexion of upper spine and head.                      Cognition Arousal/Alertness: Awake/alert Behavior During Therapy: WFL for tasks assessed/performed Overall Cognitive Status: History of cognitive impairments - at baseline                      Exercises Total Joint Exercises Ankle Circles/Pumps: AROM;10 reps;Supine Quad Sets: AROM;10 reps;Right;Supine Short Arc QuadBarbaraann Boys;Right;10 reps;Supine Hip ABduction/ADduction: AAROM;10 reps;Right;Supine Straight Leg Raises: AAROM;Right;10 reps;Supine    General Comments        Pertinent Vitals/Pain Pain Assessment: 0-10 Faces Pain Scale: Hurts little more Pain Location: R hip  Pain Descriptors / Indicators: Sore Pain Intervention(s): Monitored during session;Repositioned    Home Living                      Prior Function            PT Goals (current goals can now be found in the care plan section) Acute Rehab PT Goals Patient Stated Goal: rehab then home Potential to Achieve Goals: Fair Progress towards PT goals: Progressing toward goals    Frequency    Min 3X/week      PT Plan Current plan remains appropriate    Co-evaluation  End of Session Equipment Utilized During Treatment: Gait belt Activity Tolerance: Patient limited by fatigue Patient left: in chair;with call bell/phone within reach;with family/visitor present     Time: 1420-1449 PT Time Calculation (min) (ACUTE ONLY): 29 min  Charges:  $Therapeutic Exercise: 8-22 mins $Therapeutic Activity: 8-22 mins                    G Codes:      Florestine Aversimee J Robbin Loughmiller 11/07/2015, 4:07 PM  Joycelyn RuaAimee Kaaliyah Kita, PTA pager [phone]

## 2015-11-08 ENCOUNTER — Encounter: Payer: Self-pay | Admitting: Internal Medicine

## 2015-11-08 ENCOUNTER — Non-Acute Institutional Stay (SKILLED_NURSING_FACILITY): Payer: PPO | Admitting: Internal Medicine

## 2015-11-08 ENCOUNTER — Telehealth: Payer: Self-pay

## 2015-11-08 ENCOUNTER — Other Ambulatory Visit: Payer: Self-pay

## 2015-11-08 DIAGNOSIS — I4891 Unspecified atrial fibrillation: Secondary | ICD-10-CM

## 2015-11-08 DIAGNOSIS — I10 Essential (primary) hypertension: Secondary | ICD-10-CM | POA: Diagnosis not present

## 2015-11-08 DIAGNOSIS — I5043 Acute on chronic combined systolic (congestive) and diastolic (congestive) heart failure: Secondary | ICD-10-CM

## 2015-11-08 DIAGNOSIS — E1122 Type 2 diabetes mellitus with diabetic chronic kidney disease: Secondary | ICD-10-CM

## 2015-11-08 DIAGNOSIS — N183 Chronic kidney disease, stage 3 unspecified: Secondary | ICD-10-CM

## 2015-11-08 DIAGNOSIS — S72141S Displaced intertrochanteric fracture of right femur, sequela: Secondary | ICD-10-CM

## 2015-11-08 DIAGNOSIS — I441 Atrioventricular block, second degree: Secondary | ICD-10-CM

## 2015-11-08 DIAGNOSIS — D649 Anemia, unspecified: Secondary | ICD-10-CM | POA: Diagnosis not present

## 2015-11-08 MED ORDER — HYDROCODONE-ACETAMINOPHEN 7.5-325 MG PO TABS: ORAL_TABLET | ORAL | 0 refills | Status: DC

## 2015-11-08 NOTE — Telephone Encounter (Signed)
Attempted call to daughter Lourdes Sledge [facility]) in regards to patients current hospitalization and rehab.  Left message will schedule next ICM remote transmission for 12/10/2015.  Provided ICM number for call back.

## 2015-11-08 NOTE — Progress Notes (Signed)
Provider:  Einar Crow Location:   [facility] Room Number: 111/W Place of Service:  SNF (31)  PCP: [name]., MD Patient Care Team: Elfredia Nevins, MD as PCP - General (Internal Medicine) Romero Belling, MD as Consulting Physician (Endocrinology)  Extended Emergency Contact Information Primary Emergency Contact: Lourdes Sledge Address: [address]          [place], [place] [address] [place] Home Phone: [phone] Mobile Phone: [phone] Relation: Daughter Secondary Emergency Contact: Liliane [place] Home Phone: [phone] Mobile Phone: [phone] Relation: Grandaughter  Code Status: DNR Goals of Care: Advanced Directive information Advanced Directives 11/08/2015  Does patient have an advance directive? Yes  Type of Advance Directive Out of facility DNR (pink MOST or yellow form)  Does patient want to make changes to advanced directive? No - Patient declined  Copy of advanced directive(s) in chart? Yes  Would patient like information on creating an advanced directive? -  Pre-existing out of facility DNR order (yellow form or pink MOST form) -      Chief Complaint  Patient presents with  . New Admit To SNF    HPI: Patient is a 82 y.o. female seen today for admission to SNF for therapy. She was climbing a step stool when she fell and had right hip Fracture. She required S/P Intramedullary implant. Post surgical course was complicated by Anemia needing 2 Units of Blood transfusion.. She also Had UTI needing Antibiotics. And hyponatremia thought to be due to Dehydration. Constipation requiring disimpaction. Patient has extensive Cardiac History With  Atrial Fibrillation on Eliquis, H/O CAD .   History of AV Block 2nd degree S/P bi Ventricular pacemaker which was tested in 08/17. H/o CHF with her baseline weight stays around 135 lbs. Also has h/o Severe constipation followed by GI on Amitiza. She  also has severe osteoporosis.with Fracture of pelvis and wrist due to fall in 01/17. She is on Fosamax and calcium per her daughter. She also has Diabetes on low dose of Amaryl. Last A1c in hospital was 7.3 Patient lives with her daughter and wants to go home when more independent.  Past Medical History:  Diagnosis Date  . Acid reflux   . 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 CAD (coronary artery disease)   . Chronic kidney disease (CKD), stage III (moderate)   . Essential hypertension   . History of pneumonia   . Hyperlipidemia   . Nonischemic cardiomyopathy (HCC)   . PAF (paroxysmal atrial fibrillation) (HCC)   . Type 2 diabetes mellitus (HCC)    Past Surgical History:  Procedure Laterality Date  . Bladder tack  1970's  . CARDIAC CATHETERIZATION  10/15/2011   Normal coronaries  . CARDIOVERSION N/A 10/28/2013   Procedure: CARDIOVERSION;  Surgeon: Thurmon Fair, MD;  Location: [facility] ENDOSCOPY;  Service: Cardiovascular;  Laterality: N/A;  . CHOLECYSTECTOMY  1999  . INTRAMEDULLARY (IM) NAIL INTERTROCHANTERIC Right 11/03/2015   Procedure: INTRAMEDULLARY (IM) NAIL RIGHT INTERTROCHANTERIC HIP FRACTURE;  Surgeon: Tarry Kos, MD;  Location: [facility] OR;  Service: Orthopedics;  Laterality: Right;  . LEFT AND RIGHT HEART CATHETERIZATION WITH CORONARY ANGIOGRAM N/A 10/15/2011   Procedure: LEFT AND RIGHT HEART CATHETERIZATION WITH CORONARY ANGIOGRAM;  Surgeon: Chrystie Nose, MD;  Location: [facility] CATH LAB;  Service: Cardiovascular;  Laterality: N/A;  . NM MYOCAR PERF WALL MOTION  03/15/2009   Normal  . PACEMAKER INSERTION  10/16/2011   Medtronic  . PERMANENT  PACEMAKER INSERTION N/A 03/24/2011   Procedure: PERMANENT PACEMAKER INSERTION;  Surgeon: Thurmon Fair, MD;  Location: [facility] CATH LAB;  Service: Cardiovascular;  Laterality: N/A;  . REPLACEMENT TOTAL KNEE      reports that she has never smoked. She has never used smokeless tobacco. She reports that she does not drink alcohol or  use drugs. Social History   Social History  . Marital status: Widowed    Spouse name: N/A  . Number of children: N/A  . Years of education: N/A   Occupational History  . Not on file.   Social History Main Topics  . Smoking status: Never Smoker  . Smokeless tobacco: Never Used  . Alcohol use No  . Drug use: No  . Sexual activity: Not Currently   Other Topics Concern  . Not on file   Social History Narrative  . No narrative on file    Functional Status Survey:    Family History  Problem Relation Age of Onset  . Suicidality Mother   . Heart murmur Daughter   . Heart attack Brother     Health Maintenance  Topic Date Due  . FOOT EXAM  12/12/1936  . OPHTHALMOLOGY EXAM  12/12/1936  . URINE MICROALBUMIN  12/12/1936  . TETANUS/TDAP  12/12/1945  . ZOSTAVAX  12/13/1986  . PNA vac Low Risk Adult (2 of 2 - PPSV23) 12/14/2015 (Originally 09/14/2014)  . HEMOGLOBIN A1C  05/02/2016  . INFLUENZA VACCINE  Completed  . DEXA SCAN  Completed    Allergies  Allergen Reactions  . Coumadin [Warfarin Sodium] Other (See Comments)    Caused Patient to Bleed Out.   . Meloxicam Other (See Comments)    Unknown  . Metformin And Related Other (See Comments)    Cannot have due to kidney function  . Morphine Itching    Not in right state of mind.   . Nabumetone Nausea And Vomiting  . Oxycodone Nausea And Vomiting  . Sulfonamide Derivatives Hives      Medication List       Accurate as of 11/08/15 11:25 AM. Always use your most recent med list.          amiodarone 200 MG tablet Commonly known as:  PACERONE Take 0.5 tablets (100 mg total) by mouth daily.   ICAPS AREDS 2 Caps Take 1 capsule by mouth 2 (two) times daily.   CENTRUM SILVER PO Take 1 tablet by mouth every morning.   cephALEXin 250 MG capsule Commonly known as:  KEFLEX Take 1 capsule (250 mg total) by mouth every 12 (twelve) hours.   CO Q 10 PO Take 1 tablet by mouth daily.   ELIQUIS 2.5 MG Tabs  tablet Generic drug:  apixaban TAKE ONE TABLET BY MOUTH TWICE DAILY.   feeding supplement (ENSURE ENLIVE) Liqd Take 237 mLs by mouth 2 (two) times daily between meals.   furosemide 40 MG tablet Commonly known as:  LASIX Take 40 mg by mouth daily. 3 lbs weight gain in 24 hours extra half tablet ( 20 mg )   HYDROcodone-acetaminophen 7.5-325 MG tablet Commonly known as:  NORCO Give 1 tablet by mouth every 6 hours as needed for moderate pain. Give 2 tablet by mouth as needed for severe pain   lubiprostone 24 MCG capsule Commonly known as:  AMITIZA Take 24 mcg by mouth 2 (two) times daily with a meal.   meclizine 25 MG tablet Commonly known as:  ANTIVERT Take 25 mg by mouth 3 (three) times daily as  needed for dizziness.   metoprolol succinate 50 MG 24 hr tablet Commonly known as:  TOPROL-XL Take 1 tablet (50 mg total) by mouth daily. Take with or immediately following a meal.   nitroGLYCERIN 0.4 MG SL tablet Commonly known as:  NITROSTAT DISSOLVE 1 TABLET UNDER TONGUE EVERY 5 MINUTES UP TO 15 MIN FOR CHESTPAIN. IF NO RELIEF CALL 911.   omeprazole 20 MG capsule Commonly known as:  PRILOSEC TAKE ONE CAPSULE BY MOUTH DAILY.   ondansetron 4 MG tablet Commonly known as:  ZOFRAN Take 4 mg by mouth every 4 (four) hours as needed for nausea or vomiting.   pravastatin 40 MG tablet Commonly known as:  PRAVACHOL TAKE 1 TABLET BY MOUTH EACH EVENING.       Review of Systems  Constitutional: Positive for activity change and appetite change. Negative for chills, diaphoresis, fatigue and fever.  HENT: Negative for congestion, postnasal drip and rhinorrhea.   Respiratory: Positive for cough. Negative for apnea, choking, chest tightness, shortness of breath, wheezing and stridor.   Cardiovascular: Negative for chest pain, palpitations and leg swelling.  Gastrointestinal: Positive for constipation. Negative for abdominal distention, abdominal pain, anal bleeding, blood in stool, diarrhea,  nausea, rectal pain and vomiting.  Genitourinary: Positive for frequency. Negative for dyspareunia, dysuria, flank pain and pelvic pain.  Musculoskeletal: Positive for arthralgias and back pain.  Neurological: Negative for dizziness, light-headedness, numbness and headaches.  Psychiatric/Behavioral: Negative for agitation, behavioral problems, confusion and decreased concentration.    Vitals:   11/08/15 1124  BP: (!) 105/48  Pulse: 74  Resp: 20  Temp: 98.3 F (36.8 C)  TempSrc: Oral  SpO2: 90%   There is no height or weight on file to calculate BMI. Physical Exam  Constitutional: She is oriented to person, place, and time. She appears well-developed.  HENT:  Head: Normocephalic.  Mouth/Throat: Oropharynx is clear and moist.  Cardiovascular: Normal rate, regular rhythm and normal heart sounds.   No murmur heard. Pulmonary/Chest: Effort normal and breath sounds normal. No respiratory distress. She has no wheezes. She has no rales. She exhibits no tenderness.  Abdominal: Soft. Bowel sounds are normal. She exhibits no distension. There is no tenderness. There is no rebound.  Musculoskeletal: She exhibits no edema.  Neurological: She is alert and oriented to person, place, and time.  Good strength in all extremities except the Surgical leg.   Skin:  Has Stage 1 pressure ulcer on left heel.    Labs reviewed: Basic Metabolic Panel:  Recent Labs  84/69/6204/21/17 1055  11/05/15 1326 11/06/15 0744 11/07/15 0833  NA 134*  < > 127* 127* 129*  K 4.0  < > 4.5 4.4 4.6  CL 90*  < > 93* 92* 93*  CO2 33*  < > 24 26 27   GLUCOSE 233*  < > 270* 147* 185*  BUN 21*  < > 32* 37* 38*  CREATININE 1.47*  < > 1.61* 1.44* 1.38*  CALCIUM 9.2  < > 8.0* 7.9* 8.1*  MG 1.7  --   --   --   --   < > = values in this interval not displayed. Liver Function Tests:  Recent Labs  10/09/15 1107 10/10/15 1052 11/01/15 1658  AST 21 22 24   ALT 14 14 22   ALKPHOS 105 106 120  BILITOT 0.6 0.6 0.8  PROT 6.2  6.2 6.7  ALBUMIN 3.4* 3.3* 3.4*   No results for input(s): LIPASE, AMYLASE in the last 8760 hours. No results for input(s): AMMONIA in  the last 8760 hours. CBC:  Recent Labs  01/03/15 1154  11/01/15 1658  11/05/15 0513 11/05/15 1326 11/06/15 0744 11/06/15 1141 11/07/15 1148  WBC 13.8*  < > 11.8*  < > 13.7* 14.2* 12.2*  --   --   NEUTROABS 11.7*  --  10.1*  --   --   --   --   --   --   HGB 11.4*  < > 11.5*  < > 8.8* 9.5* 8.3* 8.5* 8.8*  HCT 34.1*  < > 35.3*  < > 24.9* 27.8* 24.1* 24.9* 26.3*  MCV 94.2  < > 98.3  < > 90.9 91.7 93.1  --   --   PLT 322  < > 263  < > 181 218 200  --   --   < > = values in this interval not displayed. Cardiac Enzymes:  Recent Labs  05/01/15 0010 05/01/15 0256 05/01/15 0556  TROPONINI <0.03 <0.03 <0.03   BNP: Invalid input(s): POCBNP Lab Results  Component Value Date   HGBA1C 7.3 (H) 11/02/2015   Lab Results  Component Value Date   TSH 3.38 07/30/2015   Lab Results  Component Value Date   VITAMINB12 483 10/12/2011   No results found for: FOLATE Lab Results  Component Value Date   IRON 41 (L) 10/12/2011   TIBC 285 10/12/2011   FERRITIN 90 10/12/2011    Imaging and Procedures obtained prior to SNF admission: Dg Chest 1 View  Result Date: 11/01/2015 CLINICAL DATA:  82 year old female with history of trauma from a fall with pain in the right hip and right femur. EXAM: CHEST 1 VIEW COMPARISON:  Chest x-ray 05/03/2015. FINDINGS: Diffuse peribronchial cuffing. Upper lobe volume loss as evidenced by upward retraction of the minor fissure and upward retraction of hilar structures bilaterally. Diffuse interstitial prominence, most evident throughout the mid to upper lungs, where there is a reticulonodular pattern which has progressively increased in prominence over several prior examinations. Lower lungs are relatively clear. No pleural effusions. No evidence of pulmonary edema no pneumothorax. Heart size is mildly enlarged. Upper  mediastinal contours are within normal limits. Atherosclerosis in the thoracic aorta. Left-sided biventricular pacemaker device in place with lead tips projecting over the expected location of the right atrium, right ventricle, and overlying the lateral wall the left ventricle via the coronary sinus and coronary veins. Bony thorax appears grossly intact. IMPRESSION: 1. No definite evidence to suggest significant acute traumatic injury to the thorax. Highly unusual appearance of the lungs, as above. This appearance can be seen in the setting of sarcoidosis. Other differential considerations include sequela of chronic hypersensitivity pneumonitis or sequela of chronic recurrent infections. These findings could be best evaluated with followup nonemergent high-resolution chest CT if clinically appropriate. 2. Mild cardiomegaly. 3. Aortic atherosclerosis. Electronically Signed   By: Trudie Reed M.D.   On: 11/01/2015 17:00   Dg Pelvis 1-2 Views  Result Date: 11/01/2015 CLINICAL DATA:  Fall.  Pain. EXAM: PELVIS - 1-2 VIEW COMPARISON:  03/12/2015. FINDINGS: A fracture along the greater trochanter of the right femur is noted. This is new from prior study of 03/12/2015. Right hip series suggested for further evaluation. Old right inferior pubic ramus fracture. Degenerative changes lumbar spine and both hips. Diffuse osteopenia. IMPRESSION: A fracture is noted along the greater trochanter of the right femur. This is new from prior study 03/12/2015. Right hip series suggested for further evaluation. Electronically Signed   By: Maisie Fus  Register   On: 11/01/2015 16:55  Ct Hip Right Wo Contrast  Result Date: 11/02/2015 CLINICAL DATA:  82 year old female right hip fracture. EXAM: CT OF THE RIGHT HIP WITHOUT CONTRAST TECHNIQUE: Multidetector CT imaging of the right hip was performed according to the standard protocol. Multiplanar CT image reconstructions were also generated. COMPARISON:  Radiograph dated 11/01/2015  FINDINGS: Bones/Joint/Cartilage There is a comminuted intratrochanteric fracture of the right femur with mild proximal migration and impaction of the femoral shaft and the femoral neck. There is associated mild varus angulation. Evaluation of the fracture is however limited due to advanced osteopenia. There is mild posterior displacement of the fracture fragment of the greater trochanter. There is no dislocation. Ligaments Suboptimally assessed by CT. Muscles and Tendons Mild edema of the right thigh musculature. No fluid collection or hematoma. Soft tissues No fluid collection or hematoma. A Foley catheter is noted within the bladder. There is scattered colonic diverticula. IMPRESSION: Comminuted intertrochanteric fracture of the right femur. Advanced osteopenia. Electronically Signed   By: Elgie Collard M.D.   On: 11/02/2015 03:31   Dg Femur Min 2 Views Right  Result Date: 11/01/2015 CLINICAL DATA:  Status post fall.  Right hip pain. EXAM: RIGHT FEMUR 2 VIEWS COMPARISON:  None. FINDINGS: Minimally displaced right intertrochanteric hip fracture. No other fracture or dislocation. Right knee arthroplasty. Peripheral vascular atherosclerotic disease. IMPRESSION: 1. Minimally displaced acute right intertrochanteric hip fracture. Electronically Signed   By: Elige Ko   On: 11/01/2015 16:53    Assessment/Plan  Closed displaced intertrochanteric fracture of right femur S/P repair Patient is here for Therapy. Patient says actually the high dose of Hydrocodone is making her sleepy and wants lower dose.will decrease the Norco to 2.5 mg/325. She is on Eliquis.  Start on Calcium and Vit D.  Acute on chronic combined systolic and diastolic CHF EF 25% Her weight today is 146 lbs. Which is more then her normal weight but her exam she was eu volemic. Will continue weighing her. Lasix was just started. She takes Metolazone as needed.   Atrial fibrillation with RVR On Eliquis.  Symptomatic advanced heart  block, 2:1 AV block, RT BBB, Lt post. fas. Block with pacemaker  Essential hypertension BP actually low. Will have to reduce Toprol if it stays low.  Type 2 diabetes mellitus   Daughter says her BS run less then 120. Will continue accu checks as they were high in hospital. Change them to BID. And restart low dose of Amaryl.  CKD (chronic kidney disease) stage 3, GFR 30-59 ml/min  Creat is 1.3 Which is close to her baseline.  Anemia Hgb 8.8 Had 2 units transfused. Will continue to monitor. Start her on Iron supplement. Monitor closely. Hyponatremia Will follow. Most likely will improve with hydration.  Constipation Start on Miralax. Continue Amitiza  UTI Continue Antibiotics.         Family/ staff Communication:   Labs/tests ordered:

## 2015-11-08 NOTE — Telephone Encounter (Signed)
Rx faxed to [facility] at [phone].   [address] [place], [place] [address]  Phone #: [phone] 

## 2015-11-10 DIAGNOSIS — R140 Abdominal distension (gaseous): Secondary | ICD-10-CM

## 2015-11-12 ENCOUNTER — Encounter (HOSPITAL_COMMUNITY)
Admission: RE | Admit: 2015-11-12 | Discharge: 2015-11-12 | Disposition: A | Discharge status: Home / Self Care | Payer: PPO | Source: Skilled Nursing Facility | Attending: Internal Medicine | Admitting: Internal Medicine

## 2015-11-12 DIAGNOSIS — D649 Anemia, unspecified: Secondary | ICD-10-CM | POA: Insufficient documentation

## 2015-11-12 DIAGNOSIS — E440 Moderate protein-calorie malnutrition: Secondary | ICD-10-CM | POA: Insufficient documentation

## 2015-11-12 DIAGNOSIS — E119 Type 2 diabetes mellitus without complications: Secondary | ICD-10-CM | POA: Insufficient documentation

## 2015-11-12 LAB — COMPREHENSIVE METABOLIC PANEL
ALBUMIN: 2.5 g/dL — AB (ref 3.5–5.0)
ALT: 33 U/L (ref 14–54)
ANION GAP: 6 (ref 5–15)
AST: 38 U/L (ref 15–41)
Alkaline Phosphatase: 132 U/L — ABNORMAL HIGH (ref 38–126)
BUN: 24 mg/dL — ABNORMAL HIGH (ref 6–20)
CHLORIDE: 97 mmol/L — AB (ref 101–111)
CO2: 26 mmol/L (ref 22–32)
Calcium: 7.6 mg/dL — ABNORMAL LOW (ref 8.9–10.3)
Creatinine, Ser: 1.33 mg/dL — ABNORMAL HIGH (ref 0.44–1.00)
GFR calc non Af Amer: 35 mL/min — ABNORMAL LOW (ref >60)
GFR, EST AFRICAN AMERICAN: 40 mL/min — AB (ref >60)
GLUCOSE: 88 mg/dL (ref 65–99)
Potassium: 3.9 mmol/L (ref 3.5–5.1)
SODIUM: 129 mmol/L — AB (ref 135–145)
Total Bilirubin: 1.5 mg/dL — ABNORMAL HIGH (ref 0.3–1.2)
Total Protein: 5.1 g/dL — ABNORMAL LOW (ref 6.5–8.1)

## 2015-11-12 LAB — CBC
HCT: 25 % — ABNORMAL LOW (ref 36.0–46.0)
Hemoglobin: 8.3 g/dL — ABNORMAL LOW (ref 12.0–15.0)
MCH: 32.5 pg (ref 26.0–34.0)
MCHC: 33.2 g/dL (ref 30.0–36.0)
MCV: 98 fL (ref 78.0–100.0)
PLATELETS: 358 10*3/uL (ref 150–400)
RBC: 2.55 MIL/uL — AB (ref 3.87–5.11)
RDW: 16.6 % — AB (ref 11.5–15.5)
WBC: 11.9 10*3/uL — AB (ref 4.0–10.5)

## 2015-11-14 ENCOUNTER — Encounter (HOSPITAL_COMMUNITY)
Admission: RE | Admit: 2015-11-14 | Discharge: 2015-11-14 | Disposition: A | Discharge status: Home / Self Care | Payer: PPO | Source: Skilled Nursing Facility | Attending: Internal Medicine | Admitting: Internal Medicine

## 2015-11-14 DIAGNOSIS — G587 Mononeuritis multiplex: Secondary | ICD-10-CM | POA: Diagnosis not present

## 2015-11-14 DIAGNOSIS — M4800 Spinal stenosis, site unspecified: Secondary | ICD-10-CM | POA: Diagnosis not present

## 2015-11-14 DIAGNOSIS — M6281 Muscle weakness (generalized): Secondary | ICD-10-CM | POA: Diagnosis not present

## 2015-11-14 DIAGNOSIS — I480 Paroxysmal atrial fibrillation: Secondary | ICD-10-CM | POA: Diagnosis not present

## 2015-11-14 DIAGNOSIS — Z7901 Long term (current) use of anticoagulants: Secondary | ICD-10-CM | POA: Diagnosis not present

## 2015-11-14 DIAGNOSIS — I10 Essential (primary) hypertension: Secondary | ICD-10-CM | POA: Diagnosis not present

## 2015-11-14 DIAGNOSIS — I420 Dilated cardiomyopathy: Secondary | ICD-10-CM | POA: Diagnosis not present

## 2015-11-14 DIAGNOSIS — R279 Unspecified lack of coordination: Secondary | ICD-10-CM | POA: Diagnosis not present

## 2015-11-14 DIAGNOSIS — E785 Hyperlipidemia, unspecified: Secondary | ICD-10-CM | POA: Diagnosis not present

## 2015-11-14 DIAGNOSIS — E440 Moderate protein-calorie malnutrition: Secondary | ICD-10-CM | POA: Diagnosis not present

## 2015-11-14 DIAGNOSIS — J449 Chronic obstructive pulmonary disease, unspecified: Secondary | ICD-10-CM | POA: Diagnosis not present

## 2015-11-14 DIAGNOSIS — I2510 Atherosclerotic heart disease of native coronary artery without angina pectoris: Secondary | ICD-10-CM | POA: Insufficient documentation

## 2015-11-14 DIAGNOSIS — Z950 Presence of cardiac pacemaker: Secondary | ICD-10-CM | POA: Diagnosis not present

## 2015-11-14 DIAGNOSIS — Z9181 History of falling: Secondary | ICD-10-CM | POA: Diagnosis not present

## 2015-11-14 DIAGNOSIS — Z7984 Long term (current) use of oral hypoglycemic drugs: Secondary | ICD-10-CM | POA: Diagnosis not present

## 2015-11-14 DIAGNOSIS — R293 Abnormal posture: Secondary | ICD-10-CM | POA: Diagnosis not present

## 2015-11-14 DIAGNOSIS — D649 Anemia, unspecified: Secondary | ICD-10-CM | POA: Diagnosis not present

## 2015-11-14 DIAGNOSIS — E119 Type 2 diabetes mellitus without complications: Secondary | ICD-10-CM | POA: Diagnosis not present

## 2015-11-14 DIAGNOSIS — M179 Osteoarthritis of knee, unspecified: Secondary | ICD-10-CM | POA: Diagnosis not present

## 2015-11-14 DIAGNOSIS — S72141D Displaced intertrochanteric fracture of right femur, subsequent encounter for closed fracture with routine healing: Secondary | ICD-10-CM | POA: Diagnosis not present

## 2015-11-14 DIAGNOSIS — I5042 Chronic combined systolic (congestive) and diastolic (congestive) heart failure: Secondary | ICD-10-CM | POA: Diagnosis not present

## 2015-11-14 DIAGNOSIS — R262 Difficulty in walking, not elsewhere classified: Secondary | ICD-10-CM | POA: Diagnosis not present

## 2015-11-14 DIAGNOSIS — N183 Chronic kidney disease, stage 3 (moderate): Secondary | ICD-10-CM | POA: Diagnosis not present

## 2015-11-14 DIAGNOSIS — R55 Syncope and collapse: Secondary | ICD-10-CM | POA: Diagnosis not present

## 2015-11-14 LAB — CBC
HEMATOCRIT: 26.3 % — AB (ref 36.0–46.0)
HEMOGLOBIN: 8.7 g/dL — AB (ref 12.0–15.0)
MCH: 32.6 pg (ref 26.0–34.0)
MCHC: 33.1 g/dL (ref 30.0–36.0)
MCV: 98.5 fL (ref 78.0–100.0)
Platelets: 403 10*3/uL — ABNORMAL HIGH (ref 150–400)
RBC: 2.67 MIL/uL — AB (ref 3.87–5.11)
RDW: 16.9 % — ABNORMAL HIGH (ref 11.5–15.5)
WBC: 12.4 10*3/uL — AB (ref 4.0–10.5)

## 2015-11-14 LAB — BASIC METABOLIC PANEL
ANION GAP: 6 (ref 5–15)
BUN: 22 mg/dL — AB (ref 6–20)
CHLORIDE: 98 mmol/L — AB (ref 101–111)
CO2: 28 mmol/L (ref 22–32)
Calcium: 8.2 mg/dL — ABNORMAL LOW (ref 8.9–10.3)
Creatinine, Ser: 1.09 mg/dL — ABNORMAL HIGH (ref 0.44–1.00)
GFR calc Af Amer: 51 mL/min — ABNORMAL LOW (ref >60)
GFR, EST NON AFRICAN AMERICAN: 44 mL/min — AB (ref >60)
GLUCOSE: 80 mg/dL (ref 65–99)
POTASSIUM: 3.8 mmol/L (ref 3.5–5.1)
Sodium: 132 mmol/L — ABNORMAL LOW (ref 135–145)

## 2015-11-18 ENCOUNTER — Emergency Department (HOSPITAL_COMMUNITY): Payer: PPO

## 2015-11-18 ENCOUNTER — Observation Stay (HOSPITAL_COMMUNITY)
Admission: EM | Admit: 2015-11-18 | Discharge: 2015-11-19 | Disposition: A | Discharge status: Home / Self Care | Payer: PPO | Attending: Internal Medicine | Admitting: Internal Medicine

## 2015-11-18 ENCOUNTER — Encounter (HOSPITAL_COMMUNITY): Payer: Self-pay | Admitting: Emergency Medicine

## 2015-11-18 DIAGNOSIS — M7989 Other specified soft tissue disorders: Secondary | ICD-10-CM | POA: Diagnosis not present

## 2015-11-18 DIAGNOSIS — I2510 Atherosclerotic heart disease of native coronary artery without angina pectoris: Secondary | ICD-10-CM | POA: Insufficient documentation

## 2015-11-18 DIAGNOSIS — N3000 Acute cystitis without hematuria: Secondary | ICD-10-CM | POA: Diagnosis not present

## 2015-11-18 DIAGNOSIS — I130 Hypertensive heart and chronic kidney disease with heart failure and stage 1 through stage 4 chronic kidney disease, or unspecified chronic kidney disease: Secondary | ICD-10-CM | POA: Diagnosis not present

## 2015-11-18 DIAGNOSIS — S7001XA Contusion of right hip, initial encounter: Secondary | ICD-10-CM | POA: Diagnosis present

## 2015-11-18 DIAGNOSIS — M25551 Pain in right hip: Secondary | ICD-10-CM | POA: Diagnosis not present

## 2015-11-18 DIAGNOSIS — L8990 Pressure ulcer of unspecified site, unspecified stage: Secondary | ICD-10-CM | POA: Insufficient documentation

## 2015-11-18 DIAGNOSIS — Z7984 Long term (current) use of oral hypoglycemic drugs: Secondary | ICD-10-CM | POA: Diagnosis not present

## 2015-11-18 DIAGNOSIS — Z79899 Other long term (current) drug therapy: Secondary | ICD-10-CM | POA: Diagnosis not present

## 2015-11-18 DIAGNOSIS — I5042 Chronic combined systolic (congestive) and diastolic (congestive) heart failure: Secondary | ICD-10-CM | POA: Insufficient documentation

## 2015-11-18 DIAGNOSIS — N183 Chronic kidney disease, stage 3 (moderate): Secondary | ICD-10-CM | POA: Insufficient documentation

## 2015-11-18 DIAGNOSIS — N390 Urinary tract infection, site not specified: Secondary | ICD-10-CM | POA: Diagnosis present

## 2015-11-18 DIAGNOSIS — R300 Dysuria: Secondary | ICD-10-CM | POA: Diagnosis not present

## 2015-11-18 DIAGNOSIS — E1122 Type 2 diabetes mellitus with diabetic chronic kidney disease: Secondary | ICD-10-CM | POA: Insufficient documentation

## 2015-11-18 HISTORY — DX: Unspecified macular degeneration: H35.30

## 2015-11-18 LAB — CBC WITH DIFFERENTIAL/PLATELET
Basophils Absolute: 0 10*3/uL (ref 0.0–0.1)
Basophils Relative: 0 %
EOS ABS: 0.1 10*3/uL (ref 0.0–0.7)
Eosinophils Relative: 1 %
HCT: 33.7 % — ABNORMAL LOW (ref 36.0–46.0)
HEMOGLOBIN: 10.7 g/dL — AB (ref 12.0–15.0)
LYMPHS PCT: 7 %
Lymphs Abs: 1 10*3/uL (ref 0.7–4.0)
MCH: 32 pg (ref 26.0–34.0)
MCHC: 31.8 g/dL (ref 30.0–36.0)
MCV: 100.9 fL — ABNORMAL HIGH (ref 78.0–100.0)
MONO ABS: 0.9 10*3/uL (ref 0.1–1.0)
Monocytes Relative: 6 %
NEUTROS ABS: 12.9 10*3/uL — AB (ref 1.7–7.7)
Neutrophils Relative %: 86 %
Platelets: 510 10*3/uL — ABNORMAL HIGH (ref 150–400)
RBC: 3.34 MIL/uL — ABNORMAL LOW (ref 3.87–5.11)
RDW: 17.4 % — ABNORMAL HIGH (ref 11.5–15.5)
WBC: 15 10*3/uL — AB (ref 4.0–10.5)

## 2015-11-18 LAB — COMPREHENSIVE METABOLIC PANEL
ALT: 57 U/L — ABNORMAL HIGH (ref 14–54)
ANION GAP: 8 (ref 5–15)
AST: 64 U/L — ABNORMAL HIGH (ref 15–41)
Albumin: 3.4 g/dL — ABNORMAL LOW (ref 3.5–5.0)
Alkaline Phosphatase: 212 U/L — ABNORMAL HIGH (ref 38–126)
BUN: 22 mg/dL — ABNORMAL HIGH (ref 6–20)
CALCIUM: 8.9 mg/dL (ref 8.9–10.3)
CO2: 28 mmol/L (ref 22–32)
Chloride: 96 mmol/L — ABNORMAL LOW (ref 101–111)
Creatinine, Ser: 1.13 mg/dL — ABNORMAL HIGH (ref 0.44–1.00)
GFR calc non Af Amer: 42 mL/min — ABNORMAL LOW (ref >60)
GFR, EST AFRICAN AMERICAN: 49 mL/min — AB (ref >60)
Glucose, Bld: 220 mg/dL — ABNORMAL HIGH (ref 65–99)
Potassium: 4.7 mmol/L (ref 3.5–5.1)
SODIUM: 132 mmol/L — AB (ref 135–145)
TOTAL PROTEIN: 6.5 g/dL (ref 6.5–8.1)
Total Bilirubin: 1 mg/dL (ref 0.3–1.2)

## 2015-11-18 LAB — URINALYSIS, ROUTINE W REFLEX MICROSCOPIC
Bilirubin Urine: NEGATIVE
Glucose, UA: NEGATIVE mg/dL
KETONES UR: NEGATIVE mg/dL
NITRITE: NEGATIVE
Specific Gravity, Urine: 1.01 (ref 1.005–1.030)
pH: 7.5 (ref 5.0–8.0)

## 2015-11-18 LAB — LACTIC ACID, PLASMA
LACTIC ACID, VENOUS: 2 mmol/L — AB (ref 0.5–1.9)
LACTIC ACID, VENOUS: 2.1 mmol/L — AB (ref 0.5–1.9)
LACTIC ACID, VENOUS: 1.2 mmol/L (ref 0.5–1.9)

## 2015-11-18 LAB — URINE MICROSCOPIC-ADD ON

## 2015-11-18 LAB — PROTIME-INR
INR: 2.22
Prothrombin Time: 25 seconds — ABNORMAL HIGH (ref 11.4–15.2)

## 2015-11-18 LAB — SEDIMENTATION RATE: SED RATE: 30 mm/h — AB (ref 0–22)

## 2015-11-18 MED ORDER — AMIODARONE HCL 200 MG PO TABS
100.0000 mg | ORAL_TABLET | Freq: Every day | ORAL | Status: DC
  Administered 2015-11-19: 100 mg via ORAL
  Filled 2015-11-18: qty 1

## 2015-11-18 MED ORDER — DOCUSATE SODIUM 100 MG PO CAPS
100.0000 mg | ORAL_CAPSULE | Freq: Two times a day (BID) | ORAL | Status: DC
  Filled 2015-11-18: qty 1

## 2015-11-18 MED ORDER — FERROUS SULFATE 325 (65 FE) MG PO TABS
325.0000 mg | ORAL_TABLET | Freq: Two times a day (BID) | ORAL | Status: DC
  Administered 2015-11-19: 325 mg via ORAL
  Filled 2015-11-18: qty 1

## 2015-11-18 MED ORDER — LACTATED RINGERS IV SOLN
INTRAVENOUS | Status: AC
  Administered 2015-11-19: 01:00:00 via INTRAVENOUS

## 2015-11-18 MED ORDER — SODIUM CHLORIDE 0.9 % IV BOLUS (SEPSIS)
500.0000 mL | Freq: Once | INTRAVENOUS | Status: AC
  Administered 2015-11-18: 500 mL via INTRAVENOUS

## 2015-11-18 MED ORDER — IOPAMIDOL (ISOVUE-300) INJECTION 61%
75.0000 mL | Freq: Once | INTRAVENOUS | Status: AC | PRN
  Administered 2015-11-18: 75 mL via INTRAVENOUS

## 2015-11-18 MED ORDER — POLYETHYLENE GLYCOL 3350 17 G PO PACK: 17.0000 g | PACK | Freq: Every day | ORAL | Status: DC | PRN

## 2015-11-18 MED ORDER — PRAVASTATIN SODIUM 40 MG PO TABS: 40.0000 mg | ORAL_TABLET | Freq: Every day | ORAL | Status: DC

## 2015-11-18 MED ORDER — ACETAMINOPHEN 650 MG RE SUPP: 650.0000 mg | Freq: Four times a day (QID) | RECTAL | Status: DC | PRN

## 2015-11-18 MED ORDER — ACETAMINOPHEN 325 MG PO TABS: 650.0000 mg | ORAL_TABLET | Freq: Four times a day (QID) | ORAL | Status: DC | PRN

## 2015-11-18 MED ORDER — DEXTROSE 5 % IV SOLN
1.0000 g | Freq: Once | INTRAVENOUS | Status: AC
  Administered 2015-11-18: 1 g via INTRAVENOUS
  Filled 2015-11-18: qty 10

## 2015-11-18 MED ORDER — LUBIPROSTONE 24 MCG PO CAPS
24.0000 ug | ORAL_CAPSULE | Freq: Two times a day (BID) | ORAL | Status: DC
  Administered 2015-11-19: 24 ug via ORAL
  Filled 2015-11-18: qty 1

## 2015-11-18 MED ORDER — METOPROLOL SUCCINATE ER 50 MG PO TB24
50.0000 mg | ORAL_TABLET | Freq: Every day | ORAL | Status: DC
  Administered 2015-11-19: 50 mg via ORAL
  Filled 2015-11-18: qty 1

## 2015-11-18 MED ORDER — ENSURE ENLIVE PO LIQD
237.0000 mL | Freq: Two times a day (BID) | ORAL | Status: DC
  Administered 2015-11-19: 237 mL via ORAL

## 2015-11-18 MED ORDER — ONDANSETRON HCL 4 MG PO TABS: 4.0000 mg | ORAL_TABLET | Freq: Four times a day (QID) | ORAL | Status: DC | PRN

## 2015-11-18 MED ORDER — PANTOPRAZOLE SODIUM 40 MG PO TBEC
40.0000 mg | DELAYED_RELEASE_TABLET | Freq: Every day | ORAL | Status: DC
  Administered 2015-11-19: 40 mg via ORAL
  Filled 2015-11-18: qty 1

## 2015-11-18 MED ORDER — SODIUM CHLORIDE 0.9 % IV BOLUS (SEPSIS)
1000.0000 mL | Freq: Once | INTRAVENOUS | Status: AC
  Administered 2015-11-18: 1000 mL via INTRAVENOUS

## 2015-11-18 MED ORDER — ONDANSETRON HCL 4 MG/2ML IJ SOLN: 4.0000 mg | Freq: Four times a day (QID) | INTRAMUSCULAR | Status: DC | PRN

## 2015-11-18 MED ORDER — OCUVITE-LUTEIN PO CAPS
1.0000 | ORAL_CAPSULE | Freq: Every day | ORAL | Status: DC
  Administered 2015-11-19: 1 via ORAL
  Filled 2015-11-18: qty 1

## 2015-11-18 MED ORDER — INSULIN ASPART 100 UNIT/ML ~~LOC~~ SOLN
0.0000 [IU] | Freq: Three times a day (TID) | SUBCUTANEOUS | Status: DC
  Administered 2015-11-19: 2 [IU] via SUBCUTANEOUS
  Administered 2015-11-19: 1 [IU] via SUBCUTANEOUS

## 2015-11-18 NOTE — ED Notes (Signed)
Pt returned from CT °

## 2015-11-18 NOTE — ED Notes (Signed)
Dr Tegeler notified of lactic acid 2.0

## 2015-11-18 NOTE — H&P (Signed)
History and Physical    Kathleen LazarHallie B Ehrmann EAV:[id number] DOB: 03/18/1924 DOA: 11/18/2015  PCP: [name],[name], MD; Chales AbrahamsGupta at [facility] Consultants:  Roda ShuttersXu - orthopedics; Odie SeraKaturi - cardiology; Rankin - retina; Thedore MinsSingh - urology Patient [facility] from: [facility]  Chief Complaint: hip pain  HPI: Kathleen [name] is a 82 y.o. female with medical history significant of Afib on Eliquis, h/o CAD s/p CABG, diastolic CHF, DM, HTN, and HLD who was previously hospitalized from 10/19-10/25 for hip fracture, now s/p repair.   Hip fracture on October 19, [facility] on [facility]. Discharged on Oct 26 - prolonged stay due to difficulty moving leg, ileus, urinary incontinence, on Eliquis - per patient/daughter report.  Has been sore, hurting but is doing therapy.  Noticed dysuria within a day or two of arrival at [facility].  Last night, she rubbed her hip and noticed bulges on that hip.  A nurse checked it today and noticed bulges which were concerning to her.  Uncertain if this bulge has been present since the bandage was changed on Thursday of last week, but it wasn't there at that time.  It is uncomfortable to move the leg/hip, but hard to tell if this was from the hematomas.  Today she complained that it felt like she was sitting on a ball - no prior complaint of this.  Has been doing therapy to move the hip around and is concerned that she may have pulled it a bit.  She does still have staples; she has an appointment with Dr. Roda ShuttersXu on 11/10.        ED Course: diagnosed with UTI and hematomas, called to discuss with orthopedics  Review of Systems: As per HPI; otherwise 10 point review of systems reviewed and negative.   Ambulatory Status:  Weight bearing with assistance, not mobile   Past Medical History:  Diagnosis Date  . Acid reflux   . 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 CAD (coronary artery disease)   . Chronic kidney disease (CKD), stage III (moderate)     . Essential hypertension   . History of pneumonia   . Hyperlipidemia   . Macular degeneration   . Nonischemic cardiomyopathy (HCC)   . PAF (paroxysmal atrial fibrillation) (HCC)   . Type 2 diabetes mellitus (HCC)     Past Surgical History:  Procedure Laterality Date  . Bladder tack  1970's  . CARDIAC CATHETERIZATION  10/15/2011   Normal coronaries  . CARDIOVERSION N/A 10/28/2013   Procedure: CARDIOVERSION;  Surgeon: Thurmon FairMihai Croitoru, MD;  Location: [facility] ENDOSCOPY;  Service: Cardiovascular;  Laterality: N/A;  . CHOLECYSTECTOMY  1999  . INTRAMEDULLARY (IM) NAIL INTERTROCHANTERIC Right 11/03/2015   Procedure: INTRAMEDULLARY (IM) NAIL RIGHT INTERTROCHANTERIC HIP FRACTURE;  Surgeon: Tarry KosNaiping M Xu, MD;  Location: [facility] OR;  Service: Orthopedics;  Laterality: Right;  . LEFT AND RIGHT HEART CATHETERIZATION WITH CORONARY ANGIOGRAM N/A 10/15/2011   Procedure: LEFT AND RIGHT HEART CATHETERIZATION WITH CORONARY ANGIOGRAM;  Surgeon: Chrystie NoseKenneth C. [name], MD;  [facility];  Service: Cardiovascular;  Laterality: N/A;  . NM MYOCAR PERF WALL MOTION  03/15/2009   Normal  . PACEMAKER INSERTION  10/16/2011   Medtronic  . PERMANENT PACEMAKER INSERTION N/A 03/24/2011   Procedure: PERMANENT PACEMAKER INSERTION;  Surgeon: Thurmon FairMihai Croitoru, MD;  Location: [facility] CATH LAB;  Service: Cardiovascular;  Laterality: N/A;  . REPLACEMENT TOTAL KNEE      Social History   Social  History  . Marital status: Widowed    Spouse name: N/A  . Number of children: N/A  . Years of education: N/A   Occupational History  . retired    Social History Main Topics  . Smoking status: Never Smoker  . Smokeless tobacco: Never Used  . Alcohol use No  . Drug use: No  . Sexual activity: Not Currently   Other Topics Concern  . Not on file   Social History Narrative  . No narrative on file    Allergies  Allergen Reactions  . Coumadin [Warfarin Sodium] Other (See Comments)    Caused Patient to Bleed Out.   . Meloxicam Other (See  Comments)    Unknown  . Metformin And Related Other (See Comments)    Cannot have due to kidney function  . Morphine Itching    Not in right state of mind.   . Nabumetone Nausea And Vomiting  . Oxycodone Nausea And Vomiting  . Sulfonamide Derivatives Hives    Family History  Problem Relation Age of Onset  . Suicidality Mother   . Heart murmur Daughter   . Heart attack Brother     Prior to Admission medications   Medication Sig Start Date End Date Taking? Authorizing Provider  acetaminophen (TYLENOL) 325 MG tablet Take 650 mg by mouth every 4 (four) hours as needed.   Yes Historical Provider, MD  amiodarone (PACERONE) 200 MG tablet Take 0.5 tablets (100 mg total) by mouth daily. 07/30/15  Yes Rosalio Macadamia, NP  Coenzyme Q10 (CO Q 10 PO) Take 1 tablet by mouth daily.   Yes Historical Provider, MD  ELIQUIS 2.5 MG TABS tablet TAKE ONE TABLET BY MOUTH TWICE DAILY. 07/09/15  Yes Mihai Croitoru, MD  feeding supplement, ENSURE ENLIVE, (ENSURE ENLIVE) LIQD Take 237 mLs by mouth 2 (two) times daily between meals. 11/07/15  Yes Belkys A Regalado, MD  ferrous sulfate 325 (65 FE) MG tablet Take 325 mg by mouth 2 (two) times daily with a meal.   Yes Historical Provider, MD  furosemide (LASIX) 40 MG tablet Take 40 mg by mouth daily. 3 lbs weight gain in 24 hours extra half tablet ( 20 mg )   Yes Historical Provider, MD  glimepiride (AMARYL) 1 MG tablet Take 1 mg by mouth daily with breakfast.   Yes Historical Provider, MD  metoprolol succinate (TOPROL-XL) 50 MG 24 hr tablet Take 1 tablet (50 mg total) by mouth daily. Take with or immediately following a meal. 11/08/15  Yes Belkys A Regalado, MD  omeprazole (PRILOSEC) 20 MG capsule TAKE ONE CAPSULE BY MOUTH DAILY. 06/18/15  Yes Mihai Croitoru, MD  polyethylene glycol (MIRALAX / GLYCOLAX) packet Take 17 g by mouth daily as needed.   Yes Historical Provider, MD  pravastatin (PRAVACHOL) 40 MG tablet TAKE 1 TABLET BY MOUTH EACH EVENING. 06/06/15  Yes Mihai  Croitoru, MD  cephALEXin (KEFLEX) 250 MG capsule Take 1 capsule (250 mg total) by mouth every 12 (twelve) hours. 11/07/15   Belkys A Regalado, MD  HYDROcodone-acetaminophen (NORCO) 7.5-325 MG tablet Give 1 tablet by mouth every 6 hours as needed for moderate pain. Give 2 tablet by mouth as needed for severe pain 11/08/15   Sharon Seller, NP  lubiprostone (AMITIZA) 24 MCG capsule Take 24 mcg by mouth 2 (two) times daily with a meal.    Historical Provider, MD  meclizine (ANTIVERT) 25 MG tablet Take 25 mg by mouth 3 (three) times daily as needed for dizziness.  Historical Provider, MD  nitroGLYCERIN (NITROSTAT) 0.4 MG SL tablet DISSOLVE 1 TABLET UNDER TONGUE EVERY 5 MINUTES UP TO 15 MIN FOR CHESTPAIN. IF NO RELIEF CALL 911. 12/25/14   Mihai Croitoru, MD  ondansetron (ZOFRAN) 4 MG tablet Take 4 mg by mouth every 4 (four) hours as needed for nausea or vomiting.    Historical Provider, MD    Physical Exam: Vitals:   11/18/15 1900 11/18/15 1930 11/18/15 2000 11/18/15 2028  BP: 155/63 162/62 160/64 (!) 155/66  Pulse:    66  Resp: 25 (!) 29 23 (!) 22  Temp:    98.2 F (36.8 C)  TempSrc:    Oral  SpO2:    91%  Weight:    64.2 kg (141 lb 8.6 oz)  Height:    5\' 6"  (1.676 m)     General:  Appears calm and comfortable and is NAD Eyes:  PERRL, EOMI, normal lids, iris ENT:  grossly normal hearing, lips & tongue, mmm Neck:  no LAD, masses or thyromegaly Cardiovascular:  RRR, no m/r/g. No LE edema.  Prominent pacemaker noted in left superior chest. Respiratory:  CTA bilaterally, no w/r/r. Normal respiratory effort. Abdomen:  soft, ntnd, NABS Skin:  Ecchymosis along right buttocks, hip, and lateral thigh Musculoskeletal:  Swelling along right lateral thigh and buttocks with palpable hematoma notable Psychiatric:  grossly normal mood and affect, speech fluent and appropriate, AOx3 Neurologic:  CN 2-12 grossly intact, moves all extremities in coordinated fashion, sensation intact  Labs on  Admission: I have personally reviewed following labs and imaging studies  CBC:  Recent Labs Lab 11/12/15 0700 11/14/15 0500 11/18/15 1301  WBC 11.9* 12.4* 15.0*  NEUTROABS  --   --  12.9*  HGB 8.3* 8.7* 10.7*  HCT 25.0* 26.3* 33.7*  MCV 98.0 98.5 100.9*  PLT 358 403* 510*   Basic Metabolic Panel:  Recent Labs Lab 11/12/15 0700 11/14/15 0500 11/18/15 1301  NA 129* 132* 132*  K 3.9 3.8 4.7  CL 97* 98* 96*  CO2 26 28 28   GLUCOSE 88 80 220*  BUN 24* 22* 22*  CREATININE 1.33* 1.09* 1.13*  CALCIUM 7.6* 8.2* 8.9   GFR: Estimated Creatinine Clearance: 32.2 mL/min (by C-G formula based on SCr of 1.13 mg/dL (H)). Liver Function Tests:  Recent Labs Lab 11/12/15 0700 11/18/15 1301  AST 38 64*  ALT 33 57*  ALKPHOS 132* 212*  BILITOT 1.5* 1.0  PROT 5.1* 6.5  ALBUMIN 2.5* 3.4*   No results for input(s): LIPASE, AMYLASE in the last 168 hours. No results for input(s): AMMONIA in the last 168 hours. Coagulation Profile:  Recent Labs Lab 11/18/15 1301  INR 2.22   Cardiac Enzymes: No results for input(s): CKTOTAL, CKMB, CKMBINDEX, TROPONINI in the last 168 hours. BNP (last 3 results) No results for input(s): PROBNP in the last 8760 hours. HbA1C: No results for input(s): HGBA1C in the last 72 hours. CBG: No results for input(s): GLUCAP in the last 168 hours. Lipid Profile: No results for input(s): CHOL, HDL, LDLCALC, TRIG, CHOLHDL, LDLDIRECT in the last 72 hours. Thyroid Function Tests: No results for input(s): TSH, T4TOTAL, FREET4, T3FREE, THYROIDAB in the last 72 hours. Anemia Panel: No results for input(s): VITAMINB12, FOLATE, FERRITIN, TIBC, IRON, RETICCTPCT in the last 72 hours. Urine analysis:    Component Value Date/Time   COLORURINE YELLOW 11/18/2015 1301   APPEARANCEUR HAZY (A) 11/18/2015 1301   LABSPEC 1.010 11/18/2015 1301   PHURINE 7.5 11/18/2015 1301   GLUCOSEU NEGATIVE 11/18/2015 1301  HGBUR TRACE (A) 11/18/2015 1301   BILIRUBINUR NEGATIVE  11/18/2015 1301   KETONESUR NEGATIVE 11/18/2015 1301   PROTEINUR TRACE (A) 11/18/2015 1301   UROBILINOGEN 0.2 10/16/2013 0910   NITRITE NEGATIVE 11/18/2015 1301   LEUKOCYTESUR MODERATE (A) 11/18/2015 1301    Creatinine Clearance: Estimated Creatinine Clearance: 32.2 mL/min (by C-G formula based on SCr of 1.13 mg/dL (H)).  Sepsis Labs: @LABRCNTIP (procalcitonin:4,lacticidven:4) )No results found for this or any previous visit (from the past 240 hour(s)).   Radiological Exams on Admission: Ct Hip Right W Contrast  Result Date: 11/18/2015 CLINICAL DATA:  Right hip pain and swelling at the surgical site. Recent intertrochanteric fracture with gamma nail and compression screw fixation. EXAM: CT OF THE RIGHT HIP WITH CONTRAST TECHNIQUE: Multidetector CT imaging was performed following the standard protocol during bolus administration of intravenous contrast. CONTRAST:  75mL ISOVUE-300 IOPAMIDOL (ISOVUE-300) INJECTION 61% COMPARISON:  Pelvic CT scan 11/01/2015 FINDINGS: Expected postoperative changes from recent gamma nail placement and proximal compression screw fixation of a complex comminuted intertrochanteric fracture. No complicating features associated with the hardware are identified. Remote healed right-sided pubic rami fractures. No new obvious intrapelvic abnormalities are identified There is a bilobed subcutaneous hematoma underlying the upper surgical skin staples. This measures 8.8 x 4.4 cm. It measures 65 Hounsfield units and is no worrisome enhancement to suggest this is an abscess. There is a second smaller hematoma noted underlying the lower surgical skin staples in the region of the dynamic hip screws. There is also skin thickening and fairly extensive subcutaneous soft tissue swelling/edema and fluid. I do not see any findings worrisome for abscess. Small hip joint effusion. IMPRESSION: 1. Expected postoperative changes from recent hip surgery. No complicating features associated with  the hardware. 2. Subcutaneous hematomas in the right hip area without CT findings to suggest an abscess. Electronically Signed   By: Rudie Meyer M.D.   On: 11/18/2015 16:42   Dg Hip Unilat W Or Wo Pelvis 2-3 Views Right  Result Date: 11/18/2015 CLINICAL DATA:  Recent hip surgery with palpable soft tissue abnormality EXAM: DG HIP (WITH OR WITHOUT PELVIS) 2-3V RIGHT COMPARISON:  11/03/2015 FINDINGS: Postoperative changes are again noted in the proximal right femur. No new fracture is seen. Mild soft tissue swelling is noted related to the recent surgery. No other focal abnormality is seen. IMPRESSION: Postoperative change. Electronically Signed   By: Alcide Clever M.D.   On: 11/18/2015 14:22    EKG: Independently reviewed.  AV paced rhythm, rate 70  Assessment/Plan Principal Problem:   Hematoma of hip, right, initial encounter Active Problems:   UTI (urinary tract infection)   Hematoma -While this may be a benign post-operative condition, it is somewhat worrisome that is was not noticed until last night and today and is only just now bothersome to the patient -Patient also with an elevated lactate that seems hard to explain by simple UTI (see below) -As such, will observe the patient overnight and monitor the hematomas for worsening -Patient was discussed with orthopedics and they do not think the patient needs transfer to [facility] at this time -Eliquis will be held tonight; will defer to day team about whether to resume tomorrow.  The patient and her daughter are aware of the stroke risk associated with afib not on anticoagulation.  UTI -Prior Klebsiella UTI resistant to ampicillin and nitrofurantoin -Will treat with rocephin for now -An uncomplicated UTI in a post-op patient who previously had a foley would generally not require admission -Assuming clinical improvement by  tomorrow and no further concern for worsening of the hematoma, patient can likely be discharged back to rehab  *Other  medical problems were not addressed at the time of the admission since this is expected to be a short-term hospitalization*   DVT prophylaxis: Eliquis (evening dose held) Code Status: DNR - confirmed with patient/family Family Communication: Daughter present throughout evaluation  Disposition Plan: Back to SNF Rehab once clinically improved Consults called: Orthopedics (telephone only by ER)  Admission status: It is my clinical opinion that referral for OBSERVATION is reasonable and necessary in this patient based on the above information provided. The aforementioned taken together are felt to place the patient at high risk for further clinical deterioration. However it is anticipated that the patient may be medically stable for discharge from the hospital within 24 to 48 hours.    Jonah Blue MD [facility]  If 7PM-7AM, please contact night-coverage [email or web address] Password [email or web address]  11/18/2015, 10:45 PM

## 2015-11-18 NOTE — ED Notes (Signed)
Patient transported to CT 

## 2015-11-18 NOTE — ED Triage Notes (Signed)
Patient comes in from [facility] due to deformity to right hip. Patient had right hip surgery October 25 for fracture. Staff reports bulge over right hip. Patient does not report any pain in that area. Patient does report usual pain to leg from surgery. Sutures in place from surgery.

## 2015-11-18 NOTE — ED Notes (Signed)
Report called to floor nurse, all questions answered 

## 2015-11-18 NOTE — ED Provider Notes (Signed)
[facility]-EMERG[facility]Y DEPT Provider Note   CSN: [id number] Arrival date & time: 11/18/15  1045   By signing my name below, I, Nelwyn SalisburyJoshua Fowler, attest that this documentation has been prepared under the direction and in the presence of Heide Scaleshristopher J Tegeler, MD . Electronically Signed: Nelwyn SalisburyJoshua Fowler, Scribe. 11/18/2015. 12:01 PM.  History   Chief Complaint Chief Complaint  Patient presents with  . Hip Pain   The history is provided by the patient. No language interpreter was used.    HPI Comments:  Kathleen [name] is a 82 y.o. female with a Pacemaker who presents to the Emergency Department complaining of sudden-onset constant unchanged right hip abnormality beginning yesterday. She describes discovering an abnormal lump on her right hip that appeared suddenly. Pt reports that she recently underwent right hip surgery where a rod was placed into her hip. She reports associated dysuria and urinary frequency. Pt denies any back pain, fevers, chills, nausea, vomiting, constipation, CP or cough. She is compliant with her blood thinning medication, eliquis. She denies any trauma to her hip since the surgery.   Past Medical History:  Diagnosis Date  . Acid reflux   . 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 CAD (coronary artery disease)   . Chronic kidney disease (CKD), stage III (moderate)   . Essential hypertension   . History of pneumonia   . Hyperlipidemia   . Nonischemic cardiomyopathy (HCC)   . PAF (paroxysmal atrial fibrillation) (HCC)   . Type 2 diabetes mellitus [facility](HCC)     Patient Active Problem List   Diagnosis Date Noted  . Abdominal distension   . Malnutrition of moderate degree 11/02/2015  . Closed displaced intertrochanteric fracture of right femur (HCC)   . Hip fracture (HCC) 11/01/2015  . Acute on chronic combined systolic and diastolic CHF, NYHA class 1 (HCC) 05/02/2015  . Pain in the chest   . Chest pain 04/30/2015  . Pelvis fracture, closed, initial  encounter 02/19/2015  . Distal radial epiphysitis 02/19/2015  . Hypercortisolemia (HCC) 10/03/2014  . UTI (urinary tract infection) 10/05/2013  . Atrial fibrillation with RVR (HCC) 10/04/2013  . Generalized weakness 10/04/2013  . Chronic combined systolic and diastolic CHF (congestive heart failure) (HCC) 10/20/2012  . CKD (chronic kidney disease) stage 3, GFR 30-59 ml/min 10/20/2012  . Cardiomyopathy, dilated, nonischemic 10/14/2011  . CRT - pacemaker Medtronic 10/14/2011  . Hypokalemia 10/12/2011  . Acute renal insufficiency, Scr was WNL in April 2013 10/11/2011  . Anemia 10/11/2011  . Unstable angina (HCC) 10/11/2011  . Gait abnormality 05/13/2011  . Syncope 03/25/2011  . COPD on CXR 03/25/2011  . Coronary atherosclerosis, no significant obstructive lesions 03/25/2011  . Symptomatic advanced heart block, 2:1 AV block, RT BBB, Lt post. fas. block.(March 2013) 03/23/2011  . Paroxysmal atrial fibrillation (HCC) 03/23/2011  . Lung nodule 12/13/2010  . DM type 2 (diabetes mellitus, type 2) (HCC) 12/13/2010  . Hyperlipidemia 12/13/2010  . TOTAL KNEE FOLLOW-UP 06/20/2009  . MONONEURITIS, LEG 02/19/2009  . KNEE, ARTHRITIS, DEGEN./OSTEO 11/30/2008  . SPINAL STENOSIS 07/04/2008  . HEMARTHROSIS, LOWER LEG 05/10/2008  . Effusion of lower leg joint 05/03/2008  . KNEE PAIN 05/03/2008  . Hypertension 05/03/2008    Past Surgical History:  Procedure Laterality Date  . Bladder tack  1970's  . CARDIAC CATHETERIZATION  10/15/2011   Normal coronaries  . CARDIOVERSION N/A 10/28/2013   Procedure: CARDIOVERSION;  Surgeon: Thurmon FairMihai Croitoru, MD;  Location: [facility] ENDOSCOPY;  Service: Cardiovascular;  Laterality: N/A;  .  CHOLECYSTECTOMY  1999  . INTRAMEDULLARY (IM) NAIL INTERTROCHANTERIC Right 11/03/2015   Procedure: INTRAMEDULLARY (IM) NAIL RIGHT INTERTROCHANTERIC HIP FRACTURE;  Surgeon: Tarry Kos, MD;  Location: [facility] OR;  Service: Orthopedics;  Laterality: Right;  . LEFT AND RIGHT HEART  CATHETERIZATION WITH CORONARY ANGIOGRAM N/A 10/15/2011   Procedure: LEFT AND RIGHT HEART CATHETERIZATION WITH CORONARY ANGIOGRAM;  Surgeon: Chrystie Nose, MD;  Location: [facility] CATH LAB;  Service: Cardiovascular;  Laterality: N/A;  . NM MYOCAR PERF WALL MOTION  03/15/2009   Normal  . PACEMAKER INSERTION  10/16/2011   Medtronic  . PERMANENT PACEMAKER INSERTION N/A 03/24/2011   Procedure: PERMANENT PACEMAKER INSERTION;  Surgeon: Thurmon Fair, MD;  Location: [facility] CATH LAB;  Service: Cardiovascular;  Laterality: N/A;  . REPLACEMENT TOTAL KNEE      OB History    No data available       Home Medications    Prior to Admission medications   Medication Sig Start Date End Date Taking? Authorizing Provider  acetaminophen (TYLENOL) 325 MG tablet Take 650 mg by mouth every 4 (four) hours as needed.   Yes Historical Provider, MD  amiodarone (PACERONE) 200 MG tablet Take 0.5 tablets (100 mg total) by mouth daily. 07/30/15  Yes Rosalio Macadamia, NP  Coenzyme Q10 (CO Q 10 PO) Take 1 tablet by mouth daily.   Yes Historical Provider, MD  ELIQUIS 2.5 MG TABS tablet TAKE ONE TABLET BY MOUTH TWICE DAILY. 07/09/15  Yes Mihai Croitoru, MD  feeding supplement, ENSURE ENLIVE, (ENSURE ENLIVE) LIQD Take 237 mLs by mouth 2 (two) times daily between meals. 11/07/15  Yes Belkys A Regalado, MD  ferrous sulfate 325 (65 FE) MG tablet Take 325 mg by mouth 2 (two) times daily with a meal.   Yes Historical Provider, MD  furosemide (LASIX) 40 MG tablet Take 40 mg by mouth daily. 3 lbs weight gain in 24 hours extra half tablet ( 20 mg )   Yes Historical Provider, MD  glimepiride (AMARYL) 1 MG tablet Take 1 mg by mouth daily with breakfast.   Yes Historical Provider, MD  metoprolol succinate (TOPROL-XL) 50 MG 24 hr tablet Take 1 tablet (50 mg total) by mouth daily. Take with or immediately following a meal. 11/08/15  Yes Belkys A Regalado, MD  omeprazole (PRILOSEC) 20 MG capsule TAKE ONE CAPSULE BY MOUTH DAILY. 06/18/15  Yes Mihai  Croitoru, MD  polyethylene glycol (MIRALAX / GLYCOLAX) packet Take 17 g by mouth daily as needed.   Yes Historical Provider, MD  pravastatin (PRAVACHOL) 40 MG tablet TAKE 1 TABLET BY MOUTH EACH EVENING. 06/06/15  Yes Mihai Croitoru, MD  cephALEXin (KEFLEX) 250 MG capsule Take 1 capsule (250 mg total) by mouth every 12 (twelve) hours. 11/07/15   Belkys A Regalado, MD  HYDROcodone-acetaminophen (NORCO) 7.5-325 MG tablet Give 1 tablet by mouth every 6 hours as needed for moderate pain. Give 2 tablet by mouth as needed for severe pain 11/08/15   Sharon Seller, NP  lubiprostone (AMITIZA) 24 MCG capsule Take 24 mcg by mouth 2 (two) times daily with a meal.    Historical Provider, MD  meclizine (ANTIVERT) 25 MG tablet Take 25 mg by mouth 3 (three) times daily as needed for dizziness.    Historical Provider, MD  nitroGLYCERIN (NITROSTAT) 0.4 MG SL tablet DISSOLVE 1 TABLET UNDER TONGUE EVERY 5 MINUTES UP TO 15 MIN FOR CHESTPAIN. IF NO RELIEF CALL 911. 12/25/14   Mihai Croitoru, MD  ondansetron (ZOFRAN) 4 MG tablet Take 4  mg by mouth every 4 (four) hours as needed for nausea or vomiting.    Historical Provider, MD    Family History Family History  Problem Relation Age of Onset  . Suicidality Mother   . Heart murmur Daughter   . Heart attack Brother     Social History Social History  Substance Use Topics  . Smoking status: Never Smoker  . Smokeless tobacco: Never Used  . Alcohol use No     Allergies   Coumadin [warfarin sodium]; Meloxicam; Metformin and related; Morphine; Nabumetone; Oxycodone; and Sulfonamide derivatives   Review of Systems Review of Systems  Constitutional: Negative for chills and fever.  Respiratory: Negative for cough.   Cardiovascular: Negative for chest pain.  Gastrointestinal: Negative for constipation, nausea and vomiting.  Genitourinary: Positive for dysuria and frequency.  Musculoskeletal: Negative for back pain.  All other systems reviewed and are  negative.    Physical Exam Updated Vital Signs BP 138/58 (BP Location: Left Arm)   Pulse 82   Temp 97.7 F (36.5 C) (Oral)   Resp 18   Ht 5\' 6"  (1.676 m)   Wt 140 lb (63.5 kg)   SpO2 97%   BMI 22.60 kg/m   Physical Exam  Constitutional: She appears well-developed and well-nourished. No distress.  HENT:  Head: Normocephalic and atraumatic.  Eyes: Conjunctivae are normal.  Neck: Neck supple.  Cardiovascular: Normal rate and regular rhythm.   No murmur heard. Pulmonary/Chest: Effort normal and breath sounds normal. No respiratory distress.  Abdominal: Soft. There is no tenderness.  Musculoskeletal: She exhibits no edema or tenderness.  Swelling in lateral aspect of her hip. Nonpulsatile. Nontender.  Neurological: She is alert.  Skin: Skin is warm and dry.  Psychiatric: She has a normal mood and affect.  Nursing note and vitals reviewed.    ED Treatments / Results  DIAGNOSTIC STUDIES:  Oxygen Saturation is 97% on RA, normal by my interpretation.    COORDINATION OF CARE:  12:23 PM Discussed treatment plan with pt at bedside which includes imaging and blood work and pt agreed to plan.  Labs (all labs ordered are listed, but only abnormal results are displayed) Labs Reviewed  CBC WITH DIFFERENTIAL/PLATELET - Abnormal; Notable for the following:       Result Value   WBC 15.0 (*)    RBC 3.34 (*)    Hemoglobin 10.7 (*)    HCT 33.7 (*)    MCV 100.9 (*)    RDW 17.4 (*)    Platelets 510 (*)    Neutro Abs 12.9 (*)    All other components within normal limits  COMPREHENSIVE METABOLIC PANEL - Abnormal; Notable for the following:    Sodium 132 (*)    Chloride 96 (*)    Glucose, Bld 220 (*)    BUN 22 (*)    Creatinine, Ser 1.13 (*)    Albumin 3.4 (*)    AST 64 (*)    ALT 57 (*)    Alkaline Phosphatase 212 (*)    GFR calc non Af Amer 42 (*)    GFR calc Af Amer 49 (*)    All other components within normal limits  URINALYSIS, ROUTINE W REFLEX MICROSCOPIC (NOT AT  [facility]) - Abnormal; Notable for the following:    APPearance HAZY (*)    Hgb urine dipstick TRACE (*)    Protein, ur TRACE (*)    Leukocytes, UA MODERATE (*)    All other components within normal limits  LACTIC ACID, PLASMA -  Abnormal; Notable for the following:    Lactic Acid, Venous 2.0 (*)    All other components within normal limits  LACTIC ACID, PLASMA - Abnormal; Notable for the following:    Lactic Acid, Venous 2.1 (*)    All other components within normal limits  SEDIMENTATION RATE - Abnormal; Notable for the following:    Sed Rate 30 (*)    All other components within normal limits  C-REACTIVE PROTEIN - Abnormal; Notable for the following:    CRP 6.9 (*)    All other components within normal limits  PROTIME-INR - Abnormal; Notable for the following:    Prothrombin Time 25.0 (*)    All other components within normal limits  URINE MICROSCOPIC-ADD ON - Abnormal; Notable for the following:    Squamous Epithelial / LPF 6-30 (*)    Bacteria, UA FEW (*)    All other components within normal limits  BASIC METABOLIC PANEL - Abnormal; Notable for the following:    Sodium 133 (*)    Chloride 100 (*)    Glucose, Bld 157 (*)    Calcium 8.3 (*)    GFR calc non Af Amer 53 (*)    All other components within normal limits  CBC - Abnormal; Notable for the following:    RBC 2.77 (*)    Hemoglobin 9.0 (*)    HCT 27.7 (*)    RDW 17.4 (*)    Platelets 418 (*)    All other components within normal limits  GLUCOSE, CAPILLARY - Abnormal; Notable for the following:    Glucose-Capillary 180 (*)    All other components within normal limits  URINE CULTURE  LACTIC ACID, PLASMA  LACTIC ACID, PLASMA    EKG  EKG Interpretation None       Radiology Ct Hip Right W Contrast  Result Date: 11/18/2015 CLINICAL DATA:  Right hip pain and swelling at the surgical site. Recent intertrochanteric fracture with gamma nail and compression screw fixation. EXAM: CT OF THE RIGHT HIP WITH CONTRAST  TECHNIQUE: Multidetector CT imaging was performed following the standard protocol during bolus administration of intravenous contrast. CONTRAST:  75mL ISOVUE-300 IOPAMIDOL (ISOVUE-300) INJECTION 61% COMPARISON:  Pelvic CT scan 11/01/2015 FINDINGS: Expected postoperative changes from recent gamma nail placement and proximal compression screw fixation of a complex comminuted intertrochanteric fracture. No complicating features associated with the hardware are identified. Remote healed right-sided pubic rami fractures. No new obvious intrapelvic abnormalities are identified There is a bilobed subcutaneous hematoma underlying the upper surgical skin staples. This measures 8.8 x 4.4 cm. It measures 65 Hounsfield units and is no worrisome enhancement to suggest this is an abscess. There is a second smaller hematoma noted underlying the lower surgical skin staples in the region of the dynamic hip screws. There is also skin thickening and fairly extensive subcutaneous soft tissue swelling/edema and fluid. I do not see any findings worrisome for abscess. Small hip joint effusion. IMPRESSION: 1. Expected postoperative changes from recent hip surgery. No complicating features associated with the hardware. 2. Subcutaneous hematomas in the right hip area without CT findings to suggest an abscess. Electronically Signed   By: Rudie Meyer M.D.   On: 11/18/2015 16:42   Dg Hip Unilat W Or Wo Pelvis 2-3 Views Right  Result Date: 11/18/2015 CLINICAL DATA:  Recent hip surgery with palpable soft tissue abnormality EXAM: DG HIP (WITH OR WITHOUT PELVIS) 2-3V RIGHT COMPARISON:  11/03/2015 FINDINGS: Postoperative changes are again noted in the proximal right femur. No new fracture  is seen. Mild soft tissue swelling is noted related to the recent surgery. No other focal abnormality is seen. IMPRESSION: Postoperative change. Electronically Signed   By: Alcide Clever M.D.   On: 11/18/2015 14:22    Procedures Procedures (including  critical care time)   Angiocath insertion Performed by: Canary Brim Tegeler  Consent: Verbal consent obtained. Risks and benefits: risks, benefits and alternatives were discussed Time out: Immediately prior to procedure a "time out" was called to verify the correct patient, procedure, equipment, support staff and site/side marked as required.  Preparation: Patient was prepped and draped in the usual sterile fashion.  Vein Location: Right arm  Ultrasound Guided  Gauge: 18  Normal blood return and flush without difficulty Patient tolerance: Patient tolerated the procedure well with no immediate complications.      Medications Ordered in ED Medications  ferrous sulfate tablet 325 mg (325 mg Oral Given 11/19/15 0838)  polyethylene glycol (MIRALAX / GLYCOLAX) packet 17 g (not administered)  feeding supplement (ENSURE ENLIVE) (ENSURE ENLIVE) liquid 237 mL (237 mLs Oral Given 11/19/15 1000)  metoprolol succinate (TOPROL-XL) 24 hr tablet 50 mg (50 mg Oral Given 11/19/15 0950)  lubiprostone (AMITIZA) capsule 24 mcg (24 mcg Oral Given 11/19/15 0838)  amiodarone (PACERONE) tablet 100 mg (100 mg Oral Given 11/19/15 0950)  pantoprazole (PROTONIX) EC tablet 40 mg (40 mg Oral Given 11/19/15 0950)  pravastatin (PRAVACHOL) tablet 40 mg (not administered)  acetaminophen (TYLENOL) tablet 650 mg (not administered)    Or  acetaminophen (TYLENOL) suppository 650 mg (not administered)  docusate sodium (COLACE) capsule 100 mg (100 mg Oral Not Given 11/19/15 0949)  ondansetron (ZOFRAN) tablet 4 mg (not administered)    Or  ondansetron (ZOFRAN) injection 4 mg (not administered)  insulin aspart (novoLOG) injection 0-9 Units (2 Units Subcutaneous Given 11/19/15 0839)  lactated ringers infusion ( Intravenous New Bag/Given 11/19/15 0030)  multivitamin-lutein (OCUVITE-LUTEIN) capsule 1 capsule (1 capsule Oral Given 11/19/15 0950)  cefTRIAXone (ROCEPHIN) 1 g in dextrose 5 % 50 mL IVPB (not administered)   iopamidol (ISOVUE-300) 61 % injection 75 mL (75 mLs Intravenous Contrast Given 11/18/15 1602)  sodium chloride 0.9 % bolus 500 mL (0 mLs Intravenous Stopped 11/18/15 1742)  sodium chloride 0.9 % bolus 1,000 mL (1,000 mLs Intravenous New Bag/Given 11/18/15 1821)  cefTRIAXone (ROCEPHIN) 1 g in dextrose 5 % 50 mL IVPB (0 g Intravenous Stopped 11/18/15 1855)     Initial Impression / Assessment and Plan / ED Course  I have reviewed the triage vital signs and the nursing notes.  Pertinent labs & imaging results that were available during my care of the patient were reviewed by me and considered in my medical decision making (see chart for details).  Clinical Course     JERRIE SCHUSSLER is a 82 y.o. female with a Pacemaker who presents to the Emergency Department complaining of sudden-onset constant unchanged right hip abnormality beginning yesterday And urinary symptoms.  History and exam are seen above.  Exam significant for several lumps in patients right. They are nontender. There is evidence of ecchymosis near them. No erythema or crepitance. Well healing incision. Patient has pain with range of motion of however, she says this is unchanged the surgery. Patient has normal strength and sensation in her Lords remedies. Symmetric pulses. No edema of legs. Lungs clear. Abdomen nontender.  Given recent postoperative time course, multiple etiologies of lumps considered. Hip dislocation considered, she is on blood thinners so hematomas are a possibility as well as abscess.  Patient had laboratory testing to look for UTI with her urinary symptoms as well as evidence of abscess. X-rays order to look for dislocation or fracture.  X-ray showed no evidence of abnormality with hip specifically, no dislocation or new fracture. CT scan ordered to look for hematoma or abscess.  CT scan showed evidence of hematomas and did not support abscess. Laboratory testing however showed concern for infection. Urine  was significant for urinary tract infection, however, patient did have elevated white blood cell count and lactic acid.  Patient given antibiotics for her UTI. Given the recent onset of likely hematomas, and her current anticoagulation status, patient was admitted for observation of hematomas, further treatment of urinary tract infection, and to watch for resolution of lactic acidosis and infection. Orthopedics was called and they did not sound concerned about a wound infection at this time. Patient continue to deny pain and the location.  Patient admitted to hospitalist service for further management.    Final Clinical Impressions(s) / ED Diagnoses   Final diagnoses:  Right hip pain  Acute cystitis without hematuria    New Prescriptions New Prescriptions   No medications on file  I personally performed the services described in this documentation, which was scribed in my presence. The recorded information has been reviewed and is accurate.   Clinical Impression: 1. Acute cystitis without hematuria   2. Right hip pain     Disposition: Admit to Hospitalist service     Heide Scales, MD 11/19/15 1034

## 2015-11-19 ENCOUNTER — Non-Acute Institutional Stay (SKILLED_NURSING_FACILITY): Payer: PPO | Admitting: Internal Medicine

## 2015-11-19 ENCOUNTER — Encounter: Payer: Self-pay | Admitting: Internal Medicine

## 2015-11-19 DIAGNOSIS — K5900 Constipation, unspecified: Secondary | ICD-10-CM | POA: Diagnosis not present

## 2015-11-19 DIAGNOSIS — E1122 Type 2 diabetes mellitus with diabetic chronic kidney disease: Secondary | ICD-10-CM | POA: Diagnosis not present

## 2015-11-19 DIAGNOSIS — N183 Chronic kidney disease, stage 3 (moderate): Secondary | ICD-10-CM

## 2015-11-19 DIAGNOSIS — L8990 Pressure ulcer of unspecified site, unspecified stage: Secondary | ICD-10-CM | POA: Insufficient documentation

## 2015-11-19 DIAGNOSIS — E119 Type 2 diabetes mellitus without complications: Secondary | ICD-10-CM | POA: Diagnosis not present

## 2015-11-19 DIAGNOSIS — Z7984 Long term (current) use of oral hypoglycemic drugs: Secondary | ICD-10-CM | POA: Diagnosis not present

## 2015-11-19 DIAGNOSIS — S7001XA Contusion of right hip, initial encounter: Secondary | ICD-10-CM | POA: Diagnosis not present

## 2015-11-19 DIAGNOSIS — M179 Osteoarthritis of knee, unspecified: Secondary | ICD-10-CM | POA: Diagnosis not present

## 2015-11-19 DIAGNOSIS — S72141S Displaced intertrochanteric fracture of right femur, sequela: Secondary | ICD-10-CM

## 2015-11-19 DIAGNOSIS — I480 Paroxysmal atrial fibrillation: Secondary | ICD-10-CM | POA: Diagnosis not present

## 2015-11-19 DIAGNOSIS — N3000 Acute cystitis without hematuria: Secondary | ICD-10-CM | POA: Diagnosis not present

## 2015-11-19 DIAGNOSIS — I10 Essential (primary) hypertension: Secondary | ICD-10-CM | POA: Diagnosis not present

## 2015-11-19 DIAGNOSIS — K219 Gastro-esophageal reflux disease without esophagitis: Secondary | ICD-10-CM | POA: Diagnosis not present

## 2015-11-19 DIAGNOSIS — E785 Hyperlipidemia, unspecified: Secondary | ICD-10-CM | POA: Diagnosis not present

## 2015-11-19 DIAGNOSIS — M4800 Spinal stenosis, site unspecified: Secondary | ICD-10-CM | POA: Diagnosis not present

## 2015-11-19 DIAGNOSIS — R262 Difficulty in walking, not elsewhere classified: Secondary | ICD-10-CM | POA: Diagnosis not present

## 2015-11-19 DIAGNOSIS — R55 Syncope and collapse: Secondary | ICD-10-CM | POA: Diagnosis not present

## 2015-11-19 DIAGNOSIS — R293 Abnormal posture: Secondary | ICD-10-CM | POA: Diagnosis not present

## 2015-11-19 DIAGNOSIS — I5042 Chronic combined systolic (congestive) and diastolic (congestive) heart failure: Secondary | ICD-10-CM | POA: Diagnosis not present

## 2015-11-19 DIAGNOSIS — E440 Moderate protein-calorie malnutrition: Secondary | ICD-10-CM | POA: Diagnosis not present

## 2015-11-19 DIAGNOSIS — D649 Anemia, unspecified: Secondary | ICD-10-CM | POA: Diagnosis not present

## 2015-11-19 DIAGNOSIS — Z950 Presence of cardiac pacemaker: Secondary | ICD-10-CM | POA: Diagnosis not present

## 2015-11-19 DIAGNOSIS — S72141D Displaced intertrochanteric fracture of right femur, subsequent encounter for closed fracture with routine healing: Secondary | ICD-10-CM | POA: Diagnosis not present

## 2015-11-19 DIAGNOSIS — J449 Chronic obstructive pulmonary disease, unspecified: Secondary | ICD-10-CM | POA: Diagnosis not present

## 2015-11-19 DIAGNOSIS — I2510 Atherosclerotic heart disease of native coronary artery without angina pectoris: Secondary | ICD-10-CM | POA: Diagnosis not present

## 2015-11-19 DIAGNOSIS — Z9181 History of falling: Secondary | ICD-10-CM | POA: Diagnosis not present

## 2015-11-19 DIAGNOSIS — Z7901 Long term (current) use of anticoagulants: Secondary | ICD-10-CM | POA: Diagnosis not present

## 2015-11-19 DIAGNOSIS — G587 Mononeuritis multiplex: Secondary | ICD-10-CM | POA: Diagnosis not present

## 2015-11-19 DIAGNOSIS — M6281 Muscle weakness (generalized): Secondary | ICD-10-CM | POA: Diagnosis not present

## 2015-11-19 DIAGNOSIS — R279 Unspecified lack of coordination: Secondary | ICD-10-CM | POA: Diagnosis not present

## 2015-11-19 DIAGNOSIS — I420 Dilated cardiomyopathy: Secondary | ICD-10-CM | POA: Diagnosis not present

## 2015-11-19 LAB — GLUCOSE, CAPILLARY
GLUCOSE-CAPILLARY: 180 mg/dL — AB (ref 65–99)
Glucose-Capillary: 147 mg/dL — ABNORMAL HIGH (ref 65–99)

## 2015-11-19 LAB — CBC
HEMATOCRIT: 27.7 % — AB (ref 36.0–46.0)
Hemoglobin: 9 g/dL — ABNORMAL LOW (ref 12.0–15.0)
MCH: 32.5 pg (ref 26.0–34.0)
MCHC: 32.5 g/dL (ref 30.0–36.0)
MCV: 100 fL (ref 78.0–100.0)
PLATELETS: 418 10*3/uL — AB (ref 150–400)
RBC: 2.77 MIL/uL — AB (ref 3.87–5.11)
RDW: 17.4 % — ABNORMAL HIGH (ref 11.5–15.5)
WBC: 9.6 10*3/uL (ref 4.0–10.5)

## 2015-11-19 LAB — BASIC METABOLIC PANEL
Anion gap: 7 (ref 5–15)
BUN: 17 mg/dL (ref 6–20)
CHLORIDE: 100 mmol/L — AB (ref 101–111)
CO2: 26 mmol/L (ref 22–32)
CREATININE: 0.94 mg/dL (ref 0.44–1.00)
Calcium: 8.3 mg/dL — ABNORMAL LOW (ref 8.9–10.3)
GFR calc non Af Amer: 53 mL/min — ABNORMAL LOW (ref >60)
Glucose, Bld: 157 mg/dL — ABNORMAL HIGH (ref 65–99)
POTASSIUM: 3.6 mmol/L (ref 3.5–5.1)
Sodium: 133 mmol/L — ABNORMAL LOW (ref 135–145)

## 2015-11-19 LAB — LACTIC ACID, PLASMA: Lactic Acid, Venous: 1.4 mmol/L (ref 0.5–1.9)

## 2015-11-19 LAB — C-REACTIVE PROTEIN: CRP: 6.9 mg/dL — ABNORMAL HIGH (ref <1.0)

## 2015-11-19 MED ORDER — APIXABAN 2.5 MG PO TABS: 2.5000 mg | ORAL_TABLET | Freq: Two times a day (BID) | ORAL | 5 refills | Status: DC

## 2015-11-19 MED ORDER — DEXTROSE 5 % IV SOLN
1.0000 g | INTRAVENOUS | Status: DC
  Filled 2015-11-19: qty 10

## 2015-11-19 MED ORDER — CEFPODOXIME PROXETIL 200 MG PO TABS: 200.0000 mg | ORAL_TABLET | Freq: Two times a day (BID) | ORAL | 0 refills | Status: AC

## 2015-11-19 MED ORDER — ALBUTEROL SULFATE (2.5 MG/3ML) 0.083% IN NEBU
2.5000 mg | INHALATION_SOLUTION | Freq: Once | RESPIRATORY_TRACT | Status: AC
  Administered 2015-11-19: 2.5 mg via RESPIRATORY_TRACT
  Filled 2015-11-19: qty 3

## 2015-11-19 NOTE — Progress Notes (Signed)
Called report to patient's nurse at [facility], [place].

## 2015-11-19 NOTE — Progress Notes (Signed)
Provider:  Einar CrowAnjali,Alp Goldwater Location:   [facility] Room Number: 111/W Place of Service:  SNF (31)  PCP: [name],[name], MD Patient Care Team: Elfredia NevinsLawrence Fusco, MD as PCP - General (Internal Medicine) Romero BellingSean Ellison, MD as Consulting Physician (Endocrinology)  Extended Emergency Contact Information Primary Emergency Contact: Lourdes SledgeEdwards,Sandra Address: [address]          [place], [place] [address] [place] Home Phone: [phone] Mobile Phone: [phone] Relation: Daughter Secondary Emergency Contact: Liliane ShiAndrew,Stephanie  [place] Home Phone: [phone] Mobile Phone: [phone] Relation: Grandaughter  Code Status: DNR Goals of Care: Advanced Directive information Advanced Directives 11/19/2015  Does patient have an advance directive? Yes  Type of Advance Directive Out of facility DNR (pink MOST or yellow form)  Does patient want to make changes to advanced directive? No - Patient declined  Copy of advanced directive(s) in chart? Yes  Would patient like information on creating an advanced directive? -  Pre-existing out of facility DNR order (yellow form or pink MOST form) -      Chief Complaint  Patient presents with  . Readmit To SNF    HPI: Patient is a 82 y.o. female seen today for Readmission to SNF For continue therapy.Patiet was initially admitted to SNF after she fell and had right hip Fracture. She required S/P Intramedullary implant. Post surgical course was complicated by Anemia needing 2 Units of Blood transfusion.. She also Had UTI needing Antibiotics. And hyponatremia thought to be due to Dehydration. Constipation requiring disimpaction. Patient has extensive Cardiac History With  Atrial Fibrillation on Eliquis, H/O CAD .  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 H/o CHF with her baseline weight stays around 135 lbs. Also has h/o Severe constipation followed by GI on  Amitiza. She also has severe osteoporosis.with Fracture of pelvis and wrist due to fall in 01/17. She is on Fosamax and calcium per her daughter. She also has Diabetes on low dose of Amaryl. Last A1c in hospital was 7.3  Patient was send to the hospital as she noticed swelling around her incision area. CT scan of that area showed that she had Subcutaneous Hematoma around the right hip area. It was d/w Dr Roda ShuttersXU and he suggested to hold Eliquis for few days and do warm compresses to the area. Patient was again started on Antibiotics for UTI. Culture still pending. Patient is doing well . Her pain is controlled. She is just tired. She did have one episode of SOB and weakness in the hospital which got resolved with Nebs.  Past Medical History:  Diagnosis Date  . Acid reflux   . 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 CAD (coronary artery disease)   . Chronic kidney disease (CKD), stage III (moderate)   . Essential hypertension   . History of pneumonia   . Hyperlipidemia   . Macular degeneration   . Nonischemic cardiomyopathy (HCC)   . PAF (paroxysmal atrial fibrillation) (HCC)   . Type 2 diabetes mellitus (HCC)    Past Surgical History:  Procedure Laterality Date  . Bladder tack  1970's  . CARDIAC CATHETERIZATION  10/15/2011   Normal coronaries  . CARDIOVERSION N/A 10/28/2013   Procedure: CARDIOVERSION;  Surgeon: Thurmon FairMihai Croitoru, MD;  Location: [facility] ENDOSCOPY;  Service: Cardiovascular;  Laterality: N/A;  . CHOLECYSTECTOMY  1999  . INTRAMEDULLARY (IM) NAIL INTERTROCHANTERIC Right 11/03/2015   Procedure: INTRAMEDULLARY (IM) NAIL RIGHT  INTERTROCHANTERIC HIP FRACTURE;  Surgeon: Tarry Kos, MD;  Location: [facility] OR;  Service: Orthopedics;  Laterality: Right;  . LEFT AND RIGHT HEART CATHETERIZATION WITH CORONARY ANGIOGRAM N/A 10/15/2011   Procedure: LEFT AND RIGHT HEART CATHETERIZATION WITH CORONARY ANGIOGRAM;  Surgeon: Chrystie Nose, MD;  Location: [facility] CATH LAB;  Service:  Cardiovascular;  Laterality: N/A;  . NM MYOCAR PERF WALL MOTION  03/15/2009   Normal  . PACEMAKER INSERTION  10/16/2011   Medtronic  . PERMANENT PACEMAKER INSERTION N/A 03/24/2011   Procedure: PERMANENT PACEMAKER INSERTION;  Surgeon: Thurmon Fair, MD;  Location: [facility] CATH LAB;  Service: Cardiovascular;  Laterality: N/A;  . REPLACEMENT TOTAL KNEE      reports that she has never smoked. She has never used smokeless tobacco. She reports that she does not drink alcohol or use drugs. Social History   Social History  . Marital status: Widowed    Spouse name: N/A  . Number of children: N/A  . Years of education: N/A   Occupational History  . retired    Social History Main Topics  . Smoking status: Never Smoker  . Smokeless tobacco: Never Used  . Alcohol use No  . Drug use: No  . Sexual activity: Not Currently   Other Topics Concern  . Not on file   Social History Narrative  . No narrative on file    Functional Status Survey:    Family History  Problem Relation Age of Onset  . Suicidality Mother   . Heart murmur Daughter   . Heart attack Brother     Health Maintenance  Topic Date Due  . FOOT EXAM  12/12/1936  . OPHTHALMOLOGY EXAM  12/12/1936  . URINE MICROALBUMIN  12/12/1936  . TETANUS/TDAP  12/12/1945  . ZOSTAVAX  12/13/1986  . PNA vac Low Risk Adult (2 of 2 - PPSV23) 12/14/2015 (Originally 09/14/2014)  . HEMOGLOBIN A1C  05/02/2016  . INFLUENZA VACCINE  Completed  . DEXA SCAN  Completed    Allergies  Allergen Reactions  . Coumadin [Warfarin Sodium] Other (See Comments)    Caused Patient to Bleed Out.   . Meloxicam Other (See Comments)    Unknown  . Metformin And Related Other (See Comments)    Cannot have due to kidney function  . Morphine Itching    Not in right state of mind.   . Nabumetone Nausea And Vomiting  . Oxycodone Nausea And Vomiting  . Sulfonamide Derivatives Hives      Medication List       Accurate as of 11/19/15  3:04 PM. Always use your  most recent med list.          acetaminophen 325 MG tablet Commonly known as:  TYLENOL Take 650 mg by mouth every 4 (four) hours as needed.   amiodarone 200 MG tablet Commonly known as:  PACERONE Take 0.5 tablets (100 mg total) by mouth daily.   apixaban 2.5 MG Tabs tablet Commonly known as:  ELIQUIS Take 1 tablet (2.5 mg total) by mouth 2 (two) times daily. Start taking on:  11/21/2015   calcium-vitamin D 500-200 MG-UNIT tablet Take 1 tablet by mouth 2 (two) times daily.   cefpodoxime 200 MG tablet Commonly known as:  VANTIN Take 1 tablet (200 mg total) by mouth 2 (two) times daily.   CEROVITE SENIOR Tabs Give 1 tablet by mouth daily   ICAPS AREDS 2 Caps Take 1 capsule by mouth twice daily   CO Q 10 PO Take 1 tablet by  mouth daily.   feeding supplement (ENSURE ENLIVE) Liqd Take 237 mLs by mouth 2 (two) times daily between meals.   ferrous sulfate 325 (65 FE) MG tablet Take 325 mg by mouth 2 (two) times daily with a meal.   furosemide 40 MG tablet Commonly known as:  LASIX Take 40 mg by mouth daily.   glimepiride 1 MG tablet Commonly known as:  AMARYL Take 0.5 mg by mouth daily with breakfast.   lubiprostone 24 MCG capsule Commonly known as:  AMITIZA Take 24 mcg by mouth 2 (two) times daily with a meal.   meclizine 25 MG tablet Commonly known as:  ANTIVERT Take 25 mg by mouth 3 (three) times daily as needed for dizziness.   metoprolol succinate 50 MG 24 hr tablet Commonly known as:  TOPROL-XL Take 1 tablet (50 mg total) by mouth daily. Take with or immediately following a meal.   nitroGLYCERIN 0.4 MG SL tablet Commonly known as:  NITROSTAT DISSOLVE 1 TABLET UNDER TONGUE EVERY 5 MINUTES UP TO 15 MIN FOR CHESTPAIN. IF NO RELIEF CALL 911.   omeprazole 20 MG capsule Commonly known as:  PRILOSEC TAKE ONE CAPSULE BY MOUTH DAILY.   ondansetron 4 MG tablet Commonly known as:  ZOFRAN Take 4 mg by mouth every 4 (four) hours as needed for nausea or  vomiting.   polyethylene glycol packet Commonly known as:  MIRALAX / GLYCOLAX Take 17 g by mouth daily as needed.   pravastatin 40 MG tablet Commonly known as:  PRAVACHOL TAKE 1 TABLET BY MOUTH EACH EVENING.       Review of Systems  Constitutional: Positive for activity change, appetite change and fatigue. Negative for chills, diaphoresis, fever and unexpected weight change.  Respiratory: Negative for apnea, cough, choking, chest tightness, shortness of breath, wheezing and stridor.   Cardiovascular: Negative for chest pain, palpitations and leg swelling.  Gastrointestinal: Negative for abdominal distention, abdominal pain, anal bleeding, blood in stool, constipation, diarrhea, nausea, rectal pain and vomiting.  Genitourinary: Positive for dysuria and urgency.  Musculoskeletal: Positive for joint swelling. Negative for arthralgias, back pain, gait problem, myalgias and neck pain.  Neurological: Positive for weakness. Negative for dizziness and light-headedness.  Psychiatric/Behavioral: Negative for agitation, behavioral problems, decreased concentration and hallucinations. The patient is not nervous/anxious.     Vitals:   11/19/15 1449  BP: (!) 147/64  Pulse: 73  Resp: (!) 24  Temp: 97.9 F (36.6 C)  TempSrc: Oral  SpO2: 97%   There is no height or weight on file to calculate BMI. Physical Exam  Constitutional: She is oriented to person, place, and time. She appears well-nourished.  HENT:  Head: Normocephalic.  Mouth/Throat: Oropharynx is clear and moist.  Cardiovascular: Normal rate, regular rhythm and normal heart sounds.   No murmur heard. Pulmonary/Chest: Effort normal. She has wheezes. She has no rales.  Abdominal: Soft. Bowel sounds are normal. She exhibits no distension and no mass. There is no tenderness. There is no rebound and no guarding.  Musculoskeletal: She exhibits no edema.  Stage 1 ulcer in both heels.  Neurological: She is alert and oriented to person,  place, and time.  Skin: Skin is warm. No erythema.    Labs reviewed: Basic Metabolic Panel:  Recent Labs  16/10/96 1055  11/14/15 0500 11/18/15 1301 11/19/15 0552  NA 134*  < > 132* 132* 133*  K 4.0  < > 3.8 4.7 3.6  CL 90*  < > 98* 96* 100*  CO2 33*  < >  28 28 26   GLUCOSE 233*  < > 80 220* 157*  BUN 21*  < > 22* 22* 17  CREATININE 1.47*  < > 1.09* 1.13* 0.94  CALCIUM 9.2  < > 8.2* 8.9 8.3*  MG 1.7  --   --   --   --   < > = values in this interval not displayed. Liver Function Tests:  Recent Labs  11/01/15 1658 11/12/15 0700 11/18/15 1301  AST 24 38 64*  ALT 22 33 57*  ALKPHOS 120 132* 212*  BILITOT 0.8 1.5* 1.0  PROT 6.7 5.1* 6.5  ALBUMIN 3.4* 2.5* 3.4*   No results for input(s): LIPASE, AMYLASE in the last 8760 hours. No results for input(s): AMMONIA in the last 8760 hours. CBC:  Recent Labs  01/03/15 1154  11/01/15 1658  11/14/15 0500 11/18/15 1301 11/19/15 0552  WBC 13.8*  < > 11.8*  < > 12.4* 15.0* 9.6  NEUTROABS 11.7*  --  10.1*  --   --  12.9*  --   HGB 11.4*  < > 11.5*  < > 8.7* 10.7* 9.0*  HCT 34.1*  < > 35.3*  < > 26.3* 33.7* 27.7*  MCV 94.2  < > 98.3  < > 98.5 100.9* 100.0  PLT 322  < > 263  < > 403* 510* 418*  < > = values in this interval not displayed. Cardiac Enzymes:  Recent Labs  05/01/15 0010 05/01/15 0256 05/01/15 0556  TROPONINI <0.03 <0.03 <0.03   BNP: Invalid input(s): POCBNP Lab Results  Component Value Date   HGBA1C 7.3 (H) 11/02/2015   Lab Results  Component Value Date   TSH 3.38 07/30/2015   Lab Results  Component Value Date   VITAMINB12 483 10/12/2011   No results found for: FOLATE Lab Results  Component Value Date   IRON 41 (L) 10/12/2011   TIBC 285 10/12/2011   FERRITIN 90 10/12/2011    Imaging and Procedures obtained prior to SNF admission: Ct Hip Right W Contrast  Result Date: 11/18/2015 CLINICAL DATA:  Right hip pain and swelling at the surgical site. Recent intertrochanteric fracture with gamma  nail and compression screw fixation. EXAM: CT OF THE RIGHT HIP WITH CONTRAST TECHNIQUE: Multidetector CT imaging was performed following the standard protocol during bolus administration of intravenous contrast. CONTRAST:  65mL ISOVUE-300 IOPAMIDOL (ISOVUE-300) INJECTION 61% COMPARISON:  Pelvic CT scan 11/01/2015 FINDINGS: Expected postoperative changes from recent gamma nail placement and proximal compression screw fixation of a complex comminuted intertrochanteric fracture. No complicating features associated with the hardware are identified. Remote healed right-sided pubic rami fractures. No new obvious intrapelvic abnormalities are identified There is a bilobed subcutaneous hematoma underlying the upper surgical skin staples. This measures 8.8 x 4.4 cm. It measures 65 Hounsfield units and is no worrisome enhancement to suggest this is an abscess. There is a second smaller hematoma noted underlying the lower surgical skin staples in the region of the dynamic hip screws. There is also skin thickening and fairly extensive subcutaneous soft tissue swelling/edema and fluid. I do not see any findings worrisome for abscess. Small hip joint effusion. IMPRESSION: 1. Expected postoperative changes from recent hip surgery. No complicating features associated with the hardware. 2. Subcutaneous hematomas in the right hip area without CT findings to suggest an abscess. Electronically Signed   By: Rudie Meyer M.D.   On: 11/18/2015 16:42   Dg Hip Unilat W Or Wo Pelvis 2-3 Views Right  Result Date: 11/18/2015 CLINICAL DATA:  Recent hip  surgery with palpable soft tissue abnormality EXAM: DG HIP (WITH OR WITHOUT PELVIS) 2-3V RIGHT COMPARISON:  11/03/2015 FINDINGS: Postoperative changes are again noted in the proximal right femur. No new fracture is seen. Mild soft tissue swelling is noted related to the recent surgery. No other focal abnormality is seen. IMPRESSION: Postoperative change. Electronically Signed   By: Alcide Clever M.D.   On: 11/18/2015 14:22    Assessment/Plan  S/P Repair of Closed displaced Fracture of right Femur.With Subcutaneous hematoma. Eliquis is on hold foe 2 more days. Warm Compresses to the area. On low dose Norco for pain. Continue therapy. Follow up with Dr Roda Shutters for staples removal.  Chronic CHF Patient weight is 142 lbs. She did receive lot of fluids in the hospital. Her weight though before in SNF was 146 lbs. Will watch her clinically. She is on her lasix 40 mg QD.  Atrial Fibrillation With RVR Eliquis would be restarted on 11/08  Essential Hypertension Continue Toprol  Type 2 Diabetes Continue Accu chaecks.  Anemia HGB 9 stable. Continue iron supplement.  UTI She is again on Antibiotics. Wait for Cultures to come back.  For her wheezing will do some nebs PRN  Family/ staff Communication: D/W daughter in room   Labs/tests ordered: BMP and CBC in 1 week.

## 2015-11-19 NOTE — Discharge Instructions (Signed)
Follow with Primary MD [name]., MD in 7 days   Get CBC, CMP, 2 view Chest X ray checked  by Primary MD or SNF MD in 5-7 days ( we routinely change or add medications that can affect your baseline labs and fluid status, therefore we recommend that you get the mentioned basic workup next visit with your PCP, your PCP may decide not to get them or add new tests based on their clinical decision)   Activity: WBAT with Full fall precautions use walker/cane & assistance as needed   Disposition SNF   Diet:   Diet Carb Modified with feeding assistance and aspiration precautions.  Accuchecks 4 times/day, Once in AM empty stomach and then before each meal. Log in all results and show them to your Prim.MD in 3 days. If any glucose reading is under 80 or above 300 call your Prim MD immidiately. Follow Low glucose instructions for glucose under 80 as instructed.  For Heart failure patients - Check your Weight same time everyday, if you gain over 2 pounds, or you develop in leg swelling, experience more shortness of breath or chest pain, call your Primary MD immediately. Follow Cardiac Low Salt Diet and 1.5 lit/day fluid restriction.   On your next visit with your primary care physician please Get Medicines reviewed and adjusted.   Please request your Prim.MD to go over all Hospital Tests and Procedure/Radiological results at the follow up, please get all Hospital records sent to your Prim MD by signing hospital release before you go home.   If you experience worsening of your admission symptoms, develop shortness of breath, life threatening emergency, suicidal or homicidal thoughts you must seek medical attention immediately by calling 911 or calling your MD immediately  if symptoms less severe.  You Must read complete instructions/literature along with all the possible adverse reactions/side effects for all the Medicines you take and that have been prescribed to you. Take any new Medicines  after you have completely understood and accpet all the possible adverse reactions/side effects.   Do not drive, operate heavy machinery, perform activities at heights, swimming or participation in water activities or provide baby sitting services if your were admitted for syncope or siezures until you have seen by Primary MD or a Neurologist and advised to do so again.  Do not drive when taking Pain medications.    Do not take more than prescribed Pain, Sleep and Anxiety Medications  Special Instructions: If you have smoked or chewed Tobacco  in the last 2 yrs please stop smoking, stop any regular Alcohol  and or any Recreational drug use.  Wear Seat belts while driving.   Please note  You were cared for by a hospitalist during your hospital stay. If you have any questions about your discharge medications or the care you received while you were in the hospital after you are discharged, you can call the unit and asked to speak with the hospitalist on call if the hospitalist that took care of you is not available. Once you are discharged, your primary care physician will handle any further medical issues. Please note that NO REFILLS for any discharge medications will be authorized once you are discharged, as it is imperative that you return to your primary care physician (or establish a relationship with a primary care physician if you do not have one) for your aftercare needs so that they can reassess your need for medications and monitor your lab values.

## 2015-11-19 NOTE — Progress Notes (Signed)
ANTIBIOTIC CONSULT NOTE  Pharmacy Consult for Rocephin for UTI  Allergies  Allergen Reactions  . Coumadin [Warfarin Sodium] Other (See Comments)    Caused Patient to Bleed Out.   . Meloxicam Other (See Comments)    Unknown  . Metformin And Related Other (See Comments)    Cannot have due to kidney function  . Morphine Itching    Not in right state of mind.   . Nabumetone Nausea And Vomiting  . Oxycodone Nausea And Vomiting  . Sulfonamide Derivatives Hives   Patient Measurements: Height: 5\' 6"  (167.6 cm) Weight: 141 lb 8.6 oz (64.2 kg) IBW/kg (Calculated) : 59.3  Vital Signs: Temp: 98.7 F (37.1 C) (11/06 0551) Temp Source: Oral (11/06 0551) BP: 144/55 (11/06 0551) Pulse Rate: 70 (11/06 0551)  Labs:  Recent Labs  11/18/15 1301 11/19/15 0552  WBC 15.0* 9.6  HGB 10.7* 9.0*  PLT 510* 418*  CREATININE 1.13* 0.94   No results for input(s): VANCOTROUGH, VANCOPEAK, VANCORANDOM, GENTTROUGH, GENTPEAK, GENTRANDOM, TOBRATROUGH, TOBRAPEAK, TOBRARND, AMIKACINPEAK, AMIKACINTROU, AMIKACIN in the last 72 hours.   Medical History: Past Medical History:  Diagnosis Date  . Acid reflux   . 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 CAD (coronary artery disease)   . Chronic kidney disease (CKD), stage III (moderate)   . Essential hypertension   . History of pneumonia   . Hyperlipidemia   . Macular degeneration   . Nonischemic cardiomyopathy (HCC)   . PAF (paroxysmal atrial fibrillation) (HCC)   . Type 2 diabetes mellitus (HCC)    Assessment: 82yo female started on Rocephin for UTI.  No renal adjustment needed.  Estimated Creatinine Clearance: 38.7 mL/min (by C-G formula based on SCr of 0.94 mg/dL).  Plan: Rocephin 1gm IV q24hrs Monitor progress, c/s  Wayland Denis, [facility] 11/19/2015

## 2015-11-19 NOTE — Progress Notes (Signed)
Patient discharged to [facility]. Iv removed and site intact. Discharged with prescriptions.

## 2015-11-19 NOTE — Clinical Social Work Note (Signed)
Clinical Social Work Assessment  Patient Details  Name: Kathleen [name] MRN: [id number] Date of Birth: 20-Aug-1924  Date of referral:  11/19/15               Reason for consult:  Discharge Planning                Permission sought to share information with:  Family Supports Permission granted to share information::  Yes, Verbal Permission Granted  Name::     Midwife::     Relationship::  daughter  Contact Information:     Housing/Transportation Living arrangements for the past 2 months:  [place] of Information:  Patient Patient Interpreter Needed:  None Criminal Activity/Legal Involvement Pertinent to Current Situation/Hospitalization:  No - Comment as needed Significant Relationships:  Adult Children Lives with:  Facility Resident Do you feel safe going back to the place where you live?  Yes Need for family participation in patient care:  Yes (Comment)  Care giving concerns:  Pt will need to return to SNF.    Social Worker assessment / plan:  CSW met with pt and pt's daughter, [name] at bedside. Pt has been at [facility] for about 2 weeks after hip fracture. She indicates progress has been slow. Pt d/c today. [place] agreeable to return and no FL2 needed. Authorization from [facility] given: [id number]. Will transfer with staff.   Employment status:  Retired Nurse, adult PT Recommendations:  Not assessed at this time Information / Referral to community resources:  Other (Comment Required) (return to [facility])  Patient/Family's Response to care:  Pt requests to return to [facility] to complete rehab.   Patient/Family's Understanding of and Emotional Response to Diagnosis, Current Treatment, and Prognosis:  Pt and family aware of admission diagnosis and that pt will be d/c today.   Emotional Assessment Appearance:  Appears stated age Attitude/Demeanor/Rapport:  Other (Cooperative) Affect (typically observed):  Appropriate Orientation:   Oriented to Self, Oriented to Place, Oriented to Situation, Oriented to  Time Alcohol / Substance use:  Not Applicable Psych involvement (Current and /or in the community):  No (Comment)  Discharge Needs  Concerns to be addressed:  Discharge Planning Concerns Readmission within the last 30 days:  Yes Current discharge risk:  Physical Impairment Barriers to Discharge:  No Barriers Identified   Salome Arnt, [place] 11/19/2015, 10:31 AM [phone]

## 2015-11-19 NOTE — Discharge Summary (Signed)
Kathleen [name]:[id number] DOB: [birth year] DOA: 11/18/2015  PCP: [name]., MD  Admit date: 11/18/2015  Discharge date: 11/19/2015  Admitted From: SNF   Disposition:  SNF   Recommendations for Outpatient Follow-up:   Follow up with PCP in 1-2 weeks  PCP Please obtain BMP/CBC, 2 view CXR in 1week,  (see Discharge instructions)   PCP Please follow up on the following pending results: Urine culture results   Home Health: None  Equipment/Devices: None  Consultations: Dr Rhett Bannister over the phone Discharge Condition: Fair   CODE STATUS: DNR   Diet Recommendation: Diet Carb Modified     Chief Complaint  Patient presents with  . Hip Pain     Brief history of present illness from the day of admission and additional interim summary    Kathleen [name] is a 82 y.o. female with medical history significant of Afib on Eliquis, h/o CAD s/p CABG, diastolic CHF, DM, HTN, and HLD who was previously hospitalized from 10/19-10/25 for hip fracture, now s/p repair.    Hip fracture on October 19, had surgical repair at [facility] on Oct 21. Discharged on Oct 26 - prolonged stay due to difficulty moving leg, ileus, urinary incontinence, on Eliquis - per patient/daughter report.  Has been sore, hurting but is doing therapy.  Noticed dysuria within a day or two of arrival at [facility], She was admitted for right hip hematoma and pain in the right hip and possible UTI.  Hospital issues addressed     Right hip hematoma. This is post right hip surgery close to 3 weeks ago, she is on Eliquis for A. fib, CT results reviewed and discussed with her orthopedic surgeon Dr. Roda Shutters, we'll hold Eliquis for 2 days, warm compresses to the area, supportive care, discharged to SNF and continue PT. She has an appointment with her orthopedic  surgeon in 4 days. Risk of stroke on holding Eliquis was discussed by admitting physician with the daughter and patient both except. Patient except today.   Mild questionable dysuria. UA suggestive of UTI. Previous urine cultures show Klebsiella which was resistant to ampicillin, for now will try Vantin for 4 days. Follow present urine cultures and treat based on symptoms.  Chronic A. fib. [place] vasc 2 score of at least 4. Continue amiodarone, resume Eliquis on 11/21/2015.  DM type II. Resume home regimen. Check CBGs before every meal CHS.  Discharge diagnosis     Principal Problem:   Hematoma of hip, right, initial encounter Active Problems:   UTI (urinary tract infection)   Pressure injury of skin    Discharge instructions    Discharge Instructions    Discharge instructions    Complete by:  As directed    Follow with Primary MD [name]., MD in 7 days   Get CBC, CMP, 2 view Chest X ray checked  by Primary MD or SNF MD in 5-7 days ( we routinely change or add medications that can affect your baseline labs and fluid status, therefore we recommend that  you get the mentioned basic workup next visit with your PCP, your PCP may decide not to get them or add new tests based on their clinical decision)   Activity: WBAT with Full fall precautions use walker/cane & assistance as needed   Disposition SNF   Diet:   Diet Carb Modified with feeding assistance and aspiration precautions.  Accuchecks 4 times/day, Once in AM empty stomach and then before each meal. Log in all results and show them to your Prim.MD in 3 days. If any glucose reading is under 80 or above 300 call your Prim MD immidiately. Follow Low glucose instructions for glucose under 80 as instructed.  For Heart failure patients - Check your Weight same time everyday, if you gain over 2 pounds, or you develop in leg swelling, experience more shortness of breath or chest pain, call your Primary MD immediately.  Follow Cardiac Low Salt Diet and 1.5 lit/day fluid restriction.   On your next visit with your primary care physician please Get Medicines reviewed and adjusted.   Please request your Prim.MD to go over all Hospital Tests and Procedure/Radiological results at the follow up, please get all Hospital records sent to your Prim MD by signing hospital release before you go home.   If you experience worsening of your admission symptoms, develop shortness of breath, life threatening emergency, suicidal or homicidal thoughts you must seek medical attention immediately by calling 911 or calling your MD immediately  if symptoms less severe.  You Must read complete instructions/literature along with all the possible adverse reactions/side effects for all the Medicines you take and that have been prescribed to you. Take any new Medicines after you have completely understood and accpet all the possible adverse reactions/side effects.   Do not drive, operate heavy machinery, perform activities at heights, swimming or participation in water activities or provide baby sitting services if your were admitted for syncope or siezures until you have seen by Primary MD or a Neurologist and advised to do so again.  Do not drive when taking Pain medications.    Do not take more than prescribed Pain, Sleep and Anxiety Medications  Special Instructions: If you have smoked or chewed Tobacco  in the last 2 yrs please stop smoking, stop any regular Alcohol  and or any Recreational drug use.  Wear Seat belts while driving.   Please note  You were cared for by a hospitalist during your hospital stay. If you have any questions about your discharge medications or the care you received while you were in the hospital after you are discharged, you can call the unit and asked to speak with the hospitalist on call if the hospitalist that took care of you is not available. Once you are discharged, your primary care physician  will handle any further medical issues. Please note that NO REFILLS for any discharge medications will be authorized once you are discharged, as it is imperative that you return to your primary care physician (or establish a relationship with a primary care physician if you do not have one) for your aftercare needs so that they can reassess your need for medications and monitor your lab values.   Increase activity slowly    Complete by:  As directed       Discharge Medications     Medication List    TAKE these medications   acetaminophen 325 MG tablet Commonly known as:  TYLENOL Take 650 mg by mouth every 4 (four) hours as needed.  amiodarone 200 MG tablet Commonly known as:  PACERONE Take 0.5 tablets (100 mg total) by mouth daily.   apixaban 2.5 MG Tabs tablet Commonly known as:  ELIQUIS Take 1 tablet (2.5 mg total) by mouth 2 (two) times daily. Start taking on:  11/21/2015 What changed:  See the new instructions.   cefpodoxime 200 MG tablet Commonly known as:  VANTIN Take 1 tablet (200 mg total) by mouth 2 (two) times daily.   CO Q 10 PO Take 1 tablet by mouth daily.   feeding supplement (ENSURE ENLIVE) Liqd Take 237 mLs by mouth 2 (two) times daily between meals.   ferrous sulfate 325 (65 FE) MG tablet Take 325 mg by mouth 2 (two) times daily with a meal.   furosemide 40 MG tablet Commonly known as:  LASIX Take 40 mg by mouth daily. 3 lbs weight gain in 24 hours extra half tablet ( 20 mg )   glimepiride 1 MG tablet Commonly known as:  AMARYL Take 1 mg by mouth daily with breakfast.   lubiprostone 24 MCG capsule Commonly known as:  AMITIZA Take 24 mcg by mouth 2 (two) times daily with a meal.   meclizine 25 MG tablet Commonly known as:  ANTIVERT Take 25 mg by mouth 3 (three) times daily as needed for dizziness.   metoprolol succinate 50 MG 24 hr tablet Commonly known as:  TOPROL-XL Take 1 tablet (50 mg total) by mouth daily. Take with or immediately  following a meal.   nitroGLYCERIN 0.4 MG SL tablet Commonly known as:  NITROSTAT DISSOLVE 1 TABLET UNDER TONGUE EVERY 5 MINUTES UP TO 15 MIN FOR CHESTPAIN. IF NO RELIEF CALL 911.   omeprazole 20 MG capsule Commonly known as:  PRILOSEC TAKE ONE CAPSULE BY MOUTH DAILY.   ondansetron 4 MG tablet Commonly known as:  ZOFRAN Take 4 mg by mouth every 4 (four) hours as needed for nausea or vomiting.   polyethylene glycol packet Commonly known as:  MIRALAX / GLYCOLAX Take 17 g by mouth daily as needed.   pravastatin 40 MG tablet Commonly known as:  PRAVACHOL TAKE 1 TABLET BY MOUTH EACH EVENING.       Follow-up Information    [name]., MD. Schedule an appointment as soon as possible for a visit in 1 day(s).   Specialty:  Internal Medicine Contact information: [address] [place] [address] [phone]        Glee Arvin, MD. Schedule an appointment as soon as possible for a visit in 1 week(s).   Specialty:  Orthopedic Surgery Contact information: [address] [place] [address] [phone]           Major procedures and Radiology Reports - PLEASE review detailed and final reports thoroughly  -     Ct Hip Right W Contrast  Result Date: 11/18/2015 CLINICAL DATA:  Right hip pain and swelling at the surgical site. Recent intertrochanteric fracture with gamma nail and compression screw fixation. EXAM: CT OF THE RIGHT HIP WITH CONTRAST TECHNIQUE: Multidetector CT imaging was performed following the standard protocol during bolus administration of intravenous contrast. CONTRAST:  50mL ISOVUE-300 IOPAMIDOL (ISOVUE-300) INJECTION 61% COMPARISON:  Pelvic CT scan 11/01/2015 FINDINGS: Expected postoperative changes from recent gamma nail placement and proximal compression screw fixation of a complex comminuted intertrochanteric fracture. No complicating features associated with the hardware are identified. Remote healed right-sided pubic rami  fractures. No new obvious intrapelvic abnormalities are identified There is a bilobed subcutaneous hematoma underlying the upper surgical  skin staples. This measures 8.8 x 4.4 cm. It measures 65 Hounsfield units and is no worrisome enhancement to suggest this is an abscess. There is a second smaller hematoma noted underlying the lower surgical skin staples in the region of the dynamic hip screws. There is also skin thickening and fairly extensive subcutaneous soft tissue swelling/edema and fluid. I do not see any findings worrisome for abscess. Small hip joint effusion. IMPRESSION: 1. Expected postoperative changes from recent hip surgery. No complicating features associated with the hardware. 2. Subcutaneous hematomas in the right hip area without CT findings to suggest an abscess. Electronically Signed   By: Rudie Meyer M.D.   On: 11/18/2015 16:42     Dg Hip Unilat W Or Wo Pelvis 2-3 Views Right  Result Date: 11/18/2015 CLINICAL DATA:  Recent hip surgery with palpable soft tissue abnormality EXAM: DG HIP (WITH OR WITHOUT PELVIS) 2-3V RIGHT COMPARISON:  11/03/2015 FINDINGS: Postoperative changes are again noted in the proximal right femur. No new fracture is seen. Mild soft tissue swelling is noted related to the recent surgery. No other focal abnormality is seen. IMPRESSION: Postoperative change. Electronically Signed   By: Alcide Clever M.D.   On: 11/18/2015 14:22      Micro Results     No results found for this or any previous visit (from the past 240 hour(s)).  Today   Subjective    Nikky Helbling today has no headache,no chest abdominal pain,no new weakness tingling or numbness, feels much better wants to go to SNF today.    Objective   Blood pressure (!) 144/55, pulse 70, temperature 98.7 F (37.1 C), temperature source Oral, resp. rate 20, height 5\' 6"  (1.676 m), weight 64.2 kg (141 lb 8.6 oz), SpO2 96 %.   Intake/Output Summary (Last 24 hours) at 11/19/15 0810 Last data filed  at 11/19/15 0300  Gross per 24 hour  Intake            552.5 ml  Output                0 ml  Net            552.5 ml    Exam Awake Alert, Oriented x 3, No new F.N deficits, Normal affect [place].AT,PERRAL Supple Neck,No JVD, No cervical lymphadenopathy appriciated.  Symmetrical Chest wall movement, Good air movement bilaterally, CTAB RRR,No Gallops,Rubs or new Murmurs, No Parasternal Heave +ve B.Sounds, Abd Soft, Non tender, No organomegaly appriciated, No rebound -guarding or rigidity. No Cyanosis, Clubbing or edema, No new Rash or bruise, R hip small hematoma, good ROM   Data Review   CBC w Diff: Lab Results  Component Value Date   WBC 9.6 11/19/2015   HGB 9.0 (L) 11/19/2015   HCT 27.7 (L) 11/19/2015   PLT 418 (H) 11/19/2015   LYMPHOPCT 7 11/18/2015   MONOPCT 6 11/18/2015   EOSPCT 1 11/18/2015   BASOPCT 0 11/18/2015    CMP: Lab Results  Component Value Date   NA 133 (L) 11/19/2015   K 3.6 11/19/2015   CL 100 (L) 11/19/2015   CO2 26 11/19/2015   BUN 17 11/19/2015   CREATININE 0.94 11/19/2015   CREATININE 1.48 (H) 10/10/2015   PROT 6.5 11/18/2015   ALBUMIN 3.4 (L) 11/18/2015   BILITOT 1.0 11/18/2015   ALKPHOS 212 (H) 11/18/2015   AST 64 (H) 11/18/2015   ALT 57 (H) 11/18/2015  .   Total Time in preparing paper work, data evaluation and todays exam -  35 minutes  Leroy Sea M.D on 11/19/2015 at 8:10 AM  [facility]   Office  [phone]

## 2015-11-21 ENCOUNTER — Telehealth: Payer: Self-pay

## 2015-11-21 LAB — URINE CULTURE: Culture: >=100000 — AB

## 2015-11-21 NOTE — Telephone Encounter (Signed)
Voice mail message from daughter, [facility]) stating patient still in nursing home and unsure when she will be discharged but may be soon.  She asked if she should take monitor to rehab.   Returned call daughter and left message that if patient will be discharged home soon then she can send a transmission at that time.  If it will continue to be more than a week, then she can take monitor to rehab and send one from there.  Advised to call back if any questions.

## 2015-11-23 ENCOUNTER — Ambulatory Visit (INDEPENDENT_AMBULATORY_CARE_PROVIDER_SITE_OTHER): Payer: PPO | Admitting: Orthopaedic Surgery

## 2015-11-23 ENCOUNTER — Encounter (INDEPENDENT_AMBULATORY_CARE_PROVIDER_SITE_OTHER): Payer: Self-pay | Admitting: Orthopaedic Surgery

## 2015-11-23 ENCOUNTER — Ambulatory Visit (INDEPENDENT_AMBULATORY_CARE_PROVIDER_SITE_OTHER): Payer: PPO

## 2015-11-23 DIAGNOSIS — S72141D Displaced intertrochanteric fracture of right femur, subsequent encounter for closed fracture with routine healing: Secondary | ICD-10-CM | POA: Diagnosis not present

## 2015-11-23 NOTE — Progress Notes (Signed)
Office Visit Note   Patient: Kathleen [name]           Date of Birth: 20-Nov-1924           MRN: [id number] Visit Date: 11/23/2015              Requested by: Elfredia NevinsLawrence Fusco, MD [address] [place], [place] [id number] PCP: [name],[name], MD   Assessment & Plan: Visit Diagnoses:  1. Closed displaced intertrochanteric fracture of right femur with routine healing, subsequent encounter     Plan:  - staples removed - continue WBAT, up with PT - may take aspirin for DVT ppx - f/u 4 weeks with repeat xrays.  Follow-Up Instructions: Return in about 4 weeks (around 12/21/2015) for recheck right hip fx.   Orders:  Orders Placed This Encounter  Procedures  . XR HIP UNILAT W OR W/O PELVIS 2-3 VIEWS RIGHT   No orders of the defined types were placed in this encounter.     Procedures: No procedures performed   Clinical Data: No additional findings.   Subjective: Chief Complaint  Patient presents with  . Right Hip - Routine Post Op, Pain, Follow-up    HPI 82 yo female here for 3 week postop visit.  Pain is well controlled.  She's stable.  Living at SNF. Review of Systems   Objective: Vital Signs: There were no vitals taken for this visit.  Physical Exam  Ortho Exam Surgical incisions healed.  Minimal pain with hip ROM. Specialty Comments:  No specialty comments available.  Imaging: Xr Hip Unilat W Or W/o Pelvis 2-3 Views Right  Result Date: 11/23/2015 Stable fixation of right intertroch hip fracture    PMFS History: Patient Active Problem List   Diagnosis Date Noted  . Pressure injury of skin 11/19/2015  . Hematoma of hip, right, initial encounter 11/18/2015  . Abdominal distension   . Malnutrition of moderate degree 11/02/2015  . Closed displaced intertrochanteric fracture of right femur (HCC)   . Hip fracture (HCC) 11/01/2015  . Acute on chronic combined systolic and diastolic CHF, NYHA class 1 (HCC) 05/02/2015  . Pain in the chest   .  Chest pain 04/30/2015  . Pelvis fracture, closed, initial encounter 02/19/2015  . Distal radial epiphysitis 02/19/2015  . Hypercortisolemia (HCC) 10/03/2014  . UTI (urinary tract infection) 10/05/2013  . Atrial fibrillation with RVR (HCC) 10/04/2013  . Generalized weakness 10/04/2013  . Chronic combined systolic and diastolic CHF (congestive heart failure) (HCC) 10/20/2012  . CKD (chronic kidney disease) stage 3, GFR 30-59 ml/min 10/20/2012  . Cardiomyopathy, dilated, nonischemic 10/14/2011  . CRT - pacemaker Medtronic 10/14/2011  . Hypokalemia 10/12/2011  . Acute renal insufficiency, Scr was WNL in April 2013 10/11/2011  . Anemia 10/11/2011  . Unstable angina (HCC) 10/11/2011  . Gait abnormality 05/13/2011  . Syncope 03/25/2011  . COPD on CXR 03/25/2011  . Coronary atherosclerosis, no significant obstructive lesions 03/25/2011  . Symptomatic advanced heart block, 2:1 AV block, RT BBB, Lt post. fas. block.(March 2013) 03/23/2011  . Paroxysmal atrial fibrillation (HCC) 03/23/2011  . Lung nodule 12/13/2010  . DM type 2 (diabetes mellitus, type 2) (HCC) 12/13/2010  . Hyperlipidemia 12/13/2010  . TOTAL KNEE FOLLOW-UP 06/20/2009  . MONONEURITIS, LEG 02/19/2009  . KNEE, ARTHRITIS, DEGEN./OSTEO 11/30/2008  . SPINAL STENOSIS 07/04/2008  . HEMARTHROSIS, LOWER LEG 05/10/2008  . Effusion of lower leg joint 05/03/2008  . KNEE PAIN 05/03/2008  . Hypertension 05/03/2008   Past Medical History:  Diagnosis Date  .  Acid reflux   . 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 CAD (coronary artery disease)   . Chronic kidney disease (CKD), stage III (moderate)   . Essential hypertension   . History of pneumonia   . Hyperlipidemia   . Macular degeneration   . Nonischemic cardiomyopathy (HCC)   . PAF (paroxysmal atrial fibrillation) (HCC)   . Type 2 diabetes mellitus (HCC)     Family History  Problem Relation Age of Onset  . Suicidality Mother   . Heart murmur Daughter   .  Heart attack Brother     Past Surgical History:  Procedure Laterality Date  . Bladder tack  1970's  . CARDIAC CATHETERIZATION  10/15/2011   Normal coronaries  . CARDIOVERSION N/A 10/28/2013   Procedure: CARDIOVERSION;  Surgeon: Thurmon Fair, MD;  Location: [facility] ENDOSCOPY;  Service: Cardiovascular;  Laterality: N/A;  . CHOLECYSTECTOMY  1999  . INTRAMEDULLARY (IM) NAIL INTERTROCHANTERIC Right 11/03/2015   Procedure: INTRAMEDULLARY (IM) NAIL RIGHT INTERTROCHANTERIC HIP FRACTURE;  Surgeon: Tarry Kos, MD;  Location: [facility] OR;  Service: Orthopedics;  Laterality: Right;  . LEFT AND RIGHT HEART CATHETERIZATION WITH CORONARY ANGIOGRAM N/A 10/15/2011   Procedure: LEFT AND RIGHT HEART CATHETERIZATION WITH CORONARY ANGIOGRAM;  Surgeon: Chrystie Nose, MD;  Location[facility] CATH LAB;  Service: Cardiovascular;  Laterality: N/A;  . NM MYOCAR PERF WALL MOTION  03/15/2009   Normal  . PACEMAKER INSERTION  10/16/2011   Medtronic  . PERMANENT PACEMAKER INSERTION N/A 03/24/2011   Procedure: PERMANENT PACEMAKER INSERTION;  Surgeon: Thurmon Fair, MD;  Location: [facility] CATH LAB;  Service: Cardiovascular;  Laterality: N/A;  . REPLACEMENT TOTAL KNEE     Social History   Occupational History  . retired    Social History Main Topics  . Smoking status: Never Smoker  . Smokeless tobacco: Never Used  . Alcohol use No  . Drug use: No  . Sexual activity: Not Currently

## 2015-11-27 ENCOUNTER — Encounter (HOSPITAL_COMMUNITY)
Admission: RE | Admit: 2015-11-27 | Discharge: 2015-11-27 | Disposition: A | Discharge status: Home / Self Care | Payer: PPO | Source: Skilled Nursing Facility | Attending: *Deleted | Admitting: *Deleted

## 2015-11-27 DIAGNOSIS — I2510 Atherosclerotic heart disease of native coronary artery without angina pectoris: Secondary | ICD-10-CM | POA: Diagnosis not present

## 2015-11-27 LAB — BASIC METABOLIC PANEL
ANION GAP: 7 (ref 5–15)
BUN: 21 mg/dL — ABNORMAL HIGH (ref 6–20)
CO2: 27 mmol/L (ref 22–32)
Calcium: 8.6 mg/dL — ABNORMAL LOW (ref 8.9–10.3)
Chloride: 98 mmol/L — ABNORMAL LOW (ref 101–111)
Creatinine, Ser: 1.01 mg/dL — ABNORMAL HIGH (ref 0.44–1.00)
GFR calc Af Amer: 56 mL/min — ABNORMAL LOW (ref >60)
GFR calc non Af Amer: 48 mL/min — ABNORMAL LOW (ref >60)
GLUCOSE: 133 mg/dL — AB (ref 65–99)
POTASSIUM: 3.7 mmol/L (ref 3.5–5.1)
Sodium: 132 mmol/L — ABNORMAL LOW (ref 135–145)

## 2015-11-27 LAB — CBC
HEMATOCRIT: 30.7 % — AB (ref 36.0–46.0)
Hemoglobin: 10.1 g/dL — ABNORMAL LOW (ref 12.0–15.0)
MCH: 33.1 pg (ref 26.0–34.0)
MCHC: 32.9 g/dL (ref 30.0–36.0)
MCV: 100.7 fL — AB (ref 78.0–100.0)
Platelets: 274 10*3/uL (ref 150–400)
RBC: 3.05 MIL/uL — AB (ref 3.87–5.11)
RDW: 16.9 % — AB (ref 11.5–15.5)
WBC: 7.7 10*3/uL (ref 4.0–10.5)

## 2015-11-28 ENCOUNTER — Other Ambulatory Visit (HOSPITAL_COMMUNITY)
Admission: RE | Admit: 2015-11-28 | Discharge: 2015-11-28 | Disposition: A | Discharge status: Home / Self Care | Payer: PPO | Source: Other Acute Inpatient Hospital | Attending: Internal Medicine | Admitting: Internal Medicine

## 2015-11-28 ENCOUNTER — Non-Acute Institutional Stay (SKILLED_NURSING_FACILITY): Payer: PPO | Admitting: Internal Medicine

## 2015-11-28 ENCOUNTER — Encounter: Payer: Self-pay | Admitting: Internal Medicine

## 2015-11-28 DIAGNOSIS — N390 Urinary tract infection, site not specified: Secondary | ICD-10-CM | POA: Insufficient documentation

## 2015-11-28 DIAGNOSIS — S72141D Displaced intertrochanteric fracture of right femur, subsequent encounter for closed fracture with routine healing: Secondary | ICD-10-CM | POA: Diagnosis not present

## 2015-11-28 DIAGNOSIS — I10 Essential (primary) hypertension: Secondary | ICD-10-CM | POA: Diagnosis not present

## 2015-11-28 DIAGNOSIS — E1122 Type 2 diabetes mellitus with diabetic chronic kidney disease: Secondary | ICD-10-CM | POA: Diagnosis not present

## 2015-11-28 DIAGNOSIS — D649 Anemia, unspecified: Secondary | ICD-10-CM

## 2015-11-28 DIAGNOSIS — I4891 Unspecified atrial fibrillation: Secondary | ICD-10-CM

## 2015-11-28 DIAGNOSIS — I5042 Chronic combined systolic (congestive) and diastolic (congestive) heart failure: Secondary | ICD-10-CM

## 2015-11-28 DIAGNOSIS — I480 Paroxysmal atrial fibrillation: Secondary | ICD-10-CM

## 2015-11-28 DIAGNOSIS — N183 Chronic kidney disease, stage 3 (moderate): Secondary | ICD-10-CM

## 2015-11-28 NOTE — Progress Notes (Signed)
Location:   [facility] Room Number: 111/W Place of Service:  SNF (31)  Provider: Edmon Crape  PCP: [name]., MD Patient Care Team: Elfredia Nevins, MD as PCP - General (Internal Medicine) Romero Belling, MD as Consulting Physician (Endocrinology)  Extended Emergency Contact Information Primary Emergency Contact: Lourdes Sledge Address: [address]          [place], [place] [address] [place] Home Phone: [phone] Mobile Phone: [phone] Relation: Daughter Secondary Emergency Contact: Liliane Shi [place] Home Phone: [phone] Mobile Phone: [phone] Relation: Grandaughter  Code Status: DNR Goals of care:  Advanced Directive information Advanced Directives 11/28/2015  Does patient have an advance directive? Yes  Type of Advance Directive Out of facility DNR (pink MOST or yellow form)  Does patient want to make changes to advanced directive? No - Patient declined  Copy of advanced directive(s) in chart? Yes  Would patient like information on creating an advanced directive? -  Pre-existing out of facility DNR order (yellow form or pink MOST form) -     Allergies  Allergen Reactions  . Coumadin [Warfarin Sodium] Other (See Comments)    Caused Patient to Bleed Out.   . Meloxicam Other (See Comments)    Unknown  . Metformin And Related Other (See Comments)    Cannot have due to kidney function  . Morphine Itching    Not in right state of mind.   . Nabumetone Nausea And Vomiting  . Oxycodone Nausea And Vomiting  . Sulfonamide Derivatives Hives    Chief Complaint  Patient presents with  . Discharge Note    HPI:  82 y.o. female  seen today for possible discharge apparently within the next week  She was initially admitted to skilled nursing after she fell and had a right hip fracture that was surgically repaired.  She did have postop anemia and required confusion. She also had a UTI  treated with antibiotics-she also had hyponatremia that was thought secondary to dehydration.  Also had significant constipation requiring disimpaction.  She was sent back to the hospital after nursing staff noted swelling around her incision area of the right hip-CT scan showed she had a subcutaneous hematoma-and it was decided to hold her Eliquis which she had been on for atrial fibrillation this has since been restarted.  She again was started on antibiotics for Klebsiella UTI.  Turned out the bacteria was Klebsiella and was treated with a cephalosporin.  Her daughter is concerned about recurrent UTIs however and would like a repeat urine culture done-she is not currently complaining of overt dysuria but her daughter states she does have frequent recurrences.  Patient has been doing quite well she was stated for discharge later this week however she family have discussed this with therapy and they are open to stay through the middle of next week since therapy feel she would benefit from continued in-house rehabilitation.  .      Past Medical History:  Diagnosis Date  . Acid reflux   . 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 CAD (coronary artery disease)   . Chronic kidney disease (CKD), stage III (moderate)   . Essential hypertension   . History of pneumonia   . Hyperlipidemia   . Macular degeneration   . Nonischemic cardiomyopathy (HCC)   . PAF (paroxysmal atrial fibrillation) (HCC)   . Type 2 diabetes mellitus (HCC)     Past Surgical History:  Procedure Laterality  Date  . Bladder tack  1970's  . CARDIAC CATHETERIZATION  10/15/2011   Normal coronaries  . CARDIOVERSION N/A 10/28/2013   Procedure: CARDIOVERSION;  Surgeon: Thurmon Fair, MD;  Location: [facility] ENDOSCOPY;  Service: Cardiovascular;  Laterality: N/A;  . CHOLECYSTECTOMY  1999  . INTRAMEDULLARY (IM) NAIL INTERTROCHANTERIC Right 11/03/2015   Procedure: INTRAMEDULLARY (IM) NAIL RIGHT  INTERTROCHANTERIC HIP FRACTURE;  Surgeon: Tarry Kos, MD;  Location: [facility] OR;  Service: Orthopedics;  Laterality: Right;  . LEFT AND RIGHT HEART CATHETERIZATION WITH CORONARY ANGIOGRAM N/A 10/15/2011   Procedure: LEFT AND RIGHT HEART CATHETERIZATION WITH CORONARY ANGIOGRAM;  Surgeon: Chrystie Nose, MD;  Location: [facility] CATH LAB;  Service: Cardiovascular;  Laterality: N/A;  . NM MYOCAR PERF WALL MOTION  03/15/2009   Normal  . PACEMAKER INSERTION  10/16/2011   Medtronic  . PERMANENT PACEMAKER INSERTION N/A 03/24/2011   Procedure: PERMANENT PACEMAKER INSERTION;  Surgeon: Thurmon Fair, MD;  Location: [facility] CATH LAB;  Service: Cardiovascular;  Laterality: N/A;  . REPLACEMENT TOTAL KNEE        reports that she has never smoked. She has never used smokeless tobacco. She reports that she does not drink alcohol or use drugs. Social History   Social History  . Marital status: Widowed    Spouse name: N/A  . Number of children: N/A  . Years of education: N/A   Occupational History  . retired    Social History Main Topics  . Smoking status: Never Smoker  . Smokeless tobacco: Never Used  . Alcohol use No  . Drug use: No  . Sexual activity: Not Currently   Other Topics Concern  . Not on file   Social History Narrative  . No narrative on file   Functional Status Survey:    Allergies  Allergen Reactions  . Coumadin [Warfarin Sodium] Other (See Comments)    Caused Patient to Bleed Out.   . Meloxicam Other (See Comments)    Unknown  . Metformin And Related Other (See Comments)    Cannot have due to kidney function  . Morphine Itching    Not in right state of mind.   . Nabumetone Nausea And Vomiting  . Oxycodone Nausea And Vomiting  . Sulfonamide Derivatives Hives    Pertinent  Health Maintenance Due  Topic Date Due  . FOOT EXAM  12/12/1934  . OPHTHALMOLOGY EXAM  12/12/1934  . URINE MICROALBUMIN  12/12/1934  . PNA vac Low Risk Adult (2 of 2 - PPSV23) 12/14/2015 (Originally 09/14/2014)   . HEMOGLOBIN A1C  05/02/2016  . INFLUENZA VACCINE  Completed  . DEXA SCAN  Completed    Medications:   Medication List       Accurate as of 11/28/15  3:34 PM. Always use your most recent med list.          acetaminophen 325 MG tablet Commonly known as:  TYLENOL Take 650 mg by mouth every 4 (four) hours as needed.   amiodarone 200 MG tablet Commonly known as:  PACERONE Take 0.5 tablets (100 mg total) by mouth daily.   apixaban 2.5 MG Tabs tablet Commonly known as:  ELIQUIS Take 1 tablet (2.5 mg total) by mouth 2 (two) times daily.   calcium-vitamin D 500-200 MG-UNIT tablet Take 1 tablet by mouth 2 (two) times daily.   CEROVITE SENIOR Tabs Give 1 tablet by mouth daily   ICAPS AREDS 2 Caps Take 1 capsule by mouth twice daily   CO Q 10 PO Take 1 tablet by  mouth daily.   feeding supplement (ENSURE ENLIVE) Liqd Take 237 mLs by mouth 2 (two) times daily between meals.   ferrous sulfate 325 (65 FE) MG tablet Take 325 mg by mouth 2 (two) times daily with a meal.   furosemide 40 MG tablet Commonly known as:  LASIX Take 40 mg by mouth daily.   glimepiride 1 MG tablet Commonly known as:  AMARYL Take 0.5 mg by mouth daily with breakfast.   ipratropium-albuterol 0.5-2.5 (3) MG/3ML Soln Commonly known as:  DUONEB Take 3 mLs by nebulization daily as needed.   lubiprostone 24 MCG capsule Commonly known as:  AMITIZA Take 24 mcg by mouth 2 (two) times daily with a meal.   meclizine 25 MG tablet Commonly known as:  ANTIVERT Take 25 mg by mouth 3 (three) times daily as needed for dizziness.   metoprolol succinate 50 MG 24 hr tablet Commonly known as:  TOPROL-XL Take 1 tablet (50 mg total) by mouth daily. Take with or immediately following a meal.   nitroGLYCERIN 0.4 MG SL tablet Commonly known as:  NITROSTAT DISSOLVE 1 TABLET UNDER TONGUE EVERY 5 MINUTES UP TO 15 MIN FOR CHESTPAIN. IF NO RELIEF CALL 911.   omeprazole 20 MG capsule Commonly known as:   PRILOSEC TAKE ONE CAPSULE BY MOUTH DAILY.   ondansetron 4 MG tablet Commonly known as:  ZOFRAN Take 4 mg by mouth every 4 (four) hours as needed for nausea or vomiting.   polyethylene glycol packet Commonly known as:  MIRALAX / GLYCOLAX Take 17 g by mouth daily as needed.   pravastatin 40 MG tablet Commonly known as:  PRAVACHOL TAKE 1 TABLET BY MOUTH EACH EVENING.   ROBITUSSIN COUGH+CHEST CONG DM 5-100 MG/5ML Liqd Generic drug:  Dextromethorphan-Guaifenesin Give 15 ml by mouth every 4 hours prn       Review of Systems  Constitutional: thinks she has a poor appetite but weight has been stable -- nursing staff says she eats fairly well and takes supplements very well. Negative for chills, diaphoresis, fever and unexpected weight change.  Respiratory: Negative for apnea, cough, choking, chest tightness, shortness of breath, wheezing and stridor.   Cardiovascular: Negative for chest pain, palpitations and leg swelling.  Gastrointestinal: Negative for abdominal distention, abdominal pain, anal bleeding, blood in stool, constipation, diarrhea, nausea, rectal pain and vomiting.  Genitourinary: Does not complaini of overt dysuria but has frequent UTIs Musculoskeletal: Positive for joint swelling which has improved. Negative for arthralgias, back pain, gait problem, myalgias and neck pain. Joint pain appears to be controlled with current medications  Neurological: Positive for weakness. Negative for dizziness and light-headedness.  Psychiatric/Behavioral: Negative for agitation, behavioral problems, decreased concentration and hallucinations. The patient is not nervous/anxious.      Vitals:   11/28/15 1511  BP: (!) 144/61  Pulse: 73  Resp: (!) 24  Temp: 97.4 F (36.3 C)  TempSrc: Oral  Blood pressures appear to very 136/57-131/51-154/59 Weight appears to be relatively stable at 138 pounds Physical Exam  Constitutional: She is oriented to person, place, and time. She appears  well-nourished. Sitting comfortably in her wheelchair HENT: Visual acuity appears intact- Head: Normocephalic.  Mouth/Throat: Oropharynx is clear and moist.  Cardiovascular: Normal rate, regular rhythm and normal heart sounds.   No murmur heard. Pulmonary/Chest: Effort normal. Clear to auscultation She has no rales very minimal wheeze ron right  .  Abdominal: Soft. Bowel sounds are normal. She exhibits no distension and no mass. There is no tenderness. There is no rebound  and no guarding.  Musculoskeletal: She exhibits minimal edema-pedal pulses intact Surgical site right hip has a well-healed small surgical scar there is still some elevation here with firmness but apparently this has improved it is non-warm nontender nonerythematous   has covering over her right heel with a history  of a stage I ulcer  Neurological: She is alert and oriented to person, place, and time.  Skin: Skin is warm. No erythema  Labs reviewed: Basic Metabolic Panel:  Recent Labs  16/10/96 1055  11/18/15 1301 11/19/15 0552 11/27/15 0500  NA 134*  < > 132* 133* 132*  K 4.0  < > 4.7 3.6 3.7  CL 90*  < > 96* 100* 98*  CO2 33*  < > 28 26 27   GLUCOSE 233*  < > 220* 157* 133*  BUN 21*  < > 22* 17 21*  CREATININE 1.47*  < > 1.13* 0.94 1.01*  CALCIUM 9.2  < > 8.9 8.3* 8.6*  MG 1.7  --   --   --   --   < > = values in this interval not displayed. Liver Function Tests:  Recent Labs  11/01/15 1658 11/12/15 0700 11/18/15 1301  AST 24 38 64*  ALT 22 33 57*  ALKPHOS 120 132* 212*  BILITOT 0.8 1.5* 1.0  PROT 6.7 5.1* 6.5  ALBUMIN 3.4* 2.5* 3.4*   No results for input(s): LIPASE, AMYLASE in the last 8760 hours. No results for input(s): AMMONIA in the last 8760 hours. CBC:  Recent Labs  01/03/15 1154  11/01/15 1658  11/18/15 1301 11/19/15 0552 11/27/15 0500  WBC 13.8*  < > 11.8*  < > 15.0* 9.6 7.7  NEUTROABS 11.7*  --  10.1*  --  12.9*  --   --   HGB 11.4*  < > 11.5*  < > 10.7* 9.0* 10.1*  HCT 34.1*   < > 35.3*  < > 33.7* 27.7* 30.7*  MCV 94.2  < > 98.3  < > 100.9* 100.0 100.7*  PLT 322  < > 263  < > 510* 418* 274  < > = values in this interval not displayed. Cardiac Enzymes:  Recent Labs  05/01/15 0010 05/01/15 0256 05/01/15 0556  TROPONINI <0.03 <0.03 <0.03   BNP: Invalid input(s): POCBNP CBG:  Recent Labs  11/07/15 1725 11/19/15 0809 11/19/15 1116  GLUCAP 175* 180* 147*    Procedures and Imaging Studies During Stay: Dg Chest 1 View  Result Date: 11/01/2015 CLINICAL DATA:  82 year old female with history of trauma from a fall with pain in the right hip and right femur. EXAM: CHEST 1 VIEW COMPARISON:  Chest x-ray 05/03/2015. FINDINGS: Diffuse peribronchial cuffing. Upper lobe volume loss as evidenced by upward retraction of the minor fissure and upward retraction of hilar structures bilaterally. Diffuse interstitial prominence, most evident throughout the mid to upper lungs, where there is a reticulonodular pattern which has progressively increased in prominence over several prior examinations. Lower lungs are relatively clear. No pleural effusions. No evidence of pulmonary edema no pneumothorax. Heart size is mildly enlarged. Upper mediastinal contours are within normal limits. Atherosclerosis in the thoracic aorta. Left-sided biventricular pacemaker device in place with lead tips projecting over the expected location of the right atrium, right ventricle, and overlying the lateral wall the left ventricle via the coronary sinus and coronary veins. Bony thorax appears grossly intact. IMPRESSION: 1. No definite evidence to suggest significant acute traumatic injury to the thorax. Highly unusual appearance of the lungs, as above. This appearance  can be seen in the setting of sarcoidosis. Other differential considerations include sequela of chronic hypersensitivity pneumonitis or sequela of chronic recurrent infections. These findings could be best evaluated with followup nonemergent  high-resolution chest CT if clinically appropriate. 2. Mild cardiomegaly. 3. Aortic atherosclerosis. Electronically Signed   By: Trudie Reed M.D.   On: 11/01/2015 17:00   Dg Pelvis 1-2 Views  Result Date: 11/01/2015 CLINICAL DATA:  Fall.  Pain. EXAM: PELVIS - 1-2 VIEW COMPARISON:  03/12/2015. FINDINGS: A fracture along the greater trochanter of the right femur is noted. This is new from prior study of 03/12/2015. Right hip series suggested for further evaluation. Old right inferior pubic ramus fracture. Degenerative changes lumbar spine and both hips. Diffuse osteopenia. IMPRESSION: A fracture is noted along the greater trochanter of the right femur. This is new from prior study 03/12/2015. Right hip series suggested for further evaluation. Electronically Signed   By: Maisie Fus  Register   On: 11/01/2015 16:55   Ct Hip Right Wo Contrast  Result Date: 11/02/2015 CLINICAL DATA:  82 year old female right hip fracture. EXAM: CT OF THE RIGHT HIP WITHOUT CONTRAST TECHNIQUE: Multidetector CT imaging of the right hip was performed according to the standard protocol. Multiplanar CT image reconstructions were also generated. COMPARISON:  Radiograph dated 11/01/2015 FINDINGS: Bones/Joint/Cartilage There is a comminuted intratrochanteric fracture of the right femur with mild proximal migration and impaction of the femoral shaft and the femoral neck. There is associated mild varus angulation. Evaluation of the fracture is however limited due to advanced osteopenia. There is mild posterior displacement of the fracture fragment of the greater trochanter. There is no dislocation. Ligaments Suboptimally assessed by CT. Muscles and Tendons Mild edema of the right thigh musculature. No fluid collection or hematoma. Soft tissues No fluid collection or hematoma. A Foley catheter is noted within the bladder. There is scattered colonic diverticula. IMPRESSION: Comminuted intertrochanteric fracture of the right femur. Advanced  osteopenia. Electronically Signed   By: Elgie Collard M.D.   On: 11/02/2015 03:31   Ct Hip Right W Contrast  Result Date: 11/18/2015 CLINICAL DATA:  Right hip pain and swelling at the surgical site. Recent intertrochanteric fracture with gamma nail and compression screw fixation. EXAM: CT OF THE RIGHT HIP WITH CONTRAST TECHNIQUE: Multidetector CT imaging was performed following the standard protocol during bolus administration of intravenous contrast. CONTRAST:  75mL ISOVUE-300 IOPAMIDOL (ISOVUE-300) INJECTION 61% COMPARISON:  Pelvic CT scan 11/01/2015 FINDINGS: Expected postoperative changes from recent gamma nail placement and proximal compression screw fixation of a complex comminuted intertrochanteric fracture. No complicating features associated with the hardware are identified. Remote healed right-sided pubic rami fractures. No new obvious intrapelvic abnormalities are identified There is a bilobed subcutaneous hematoma underlying the upper surgical skin staples. This measures 8.8 x 4.4 cm. It measures 65 Hounsfield units and is no worrisome enhancement to suggest this is an abscess. There is a second smaller hematoma noted underlying the lower surgical skin staples in the region of the dynamic hip screws. There is also skin thickening and fairly extensive subcutaneous soft tissue swelling/edema and fluid. I do not see any findings worrisome for abscess. Small hip joint effusion. IMPRESSION: 1. Expected postoperative changes from recent hip surgery. No complicating features associated with the hardware. 2. Subcutaneous hematomas in the right hip area without CT findings to suggest an abscess. Electronically Signed   By: Rudie Meyer M.D.   On: 11/18/2015 16:42   Dg Abd Portable 1v  Result Date: 11/06/2015 CLINICAL DATA:  Abdominal distension  constipation. Recent hip surgery. EXAM: PORTABLE ABDOMEN - 1 VIEW COMPARISON:  None. FINDINGS: Single supine view of the abdomen and pelvis. Pacer.  Cardiomegaly. No gaseous distention of bowel loops. Minimal motion degradation. Moderate amount of distal stool. Incompletely imaged right proximal femoral fixation. Probable cholecystectomy. Indeterminate calcification projecting lateral to the L4-5 level. IMPRESSION: Mildly motion degraded exam. No evidence of bowel obstruction. Colonic stool burden suggests constipation. Electronically Signed   By: Jeronimo Greaves M.D.   On: 11/06/2015 11:09   Dg C-arm 1-60 Min  Result Date: 11/03/2015 CLINICAL DATA:  Operative imaging for right proximal femur fracture ORIF. EXAM: DG C-ARM 61-120 MIN; OPERATIVE RIGHT HIP WITH PELVIS COMPARISON:  11/01/2015. FINDINGS: Two screws are supported by an medullary rod reducing the intertrochanteric fracture into near anatomic alignment. The orthopedic hardware appears well seated well aligned. IMPRESSION: 1. Well-aligned right proximal femur intertrochanteric fracture following ORIF. Electronically Signed   By: Amie Portland M.D.   On: 11/03/2015 08:51   Dg Hip Operative Unilat W Or W/o Pelvis Right  Result Date: 11/03/2015 CLINICAL DATA:  Operative imaging for right proximal femur fracture ORIF. EXAM: DG C-ARM 61-120 MIN; OPERATIVE RIGHT HIP WITH PELVIS COMPARISON:  11/01/2015. FINDINGS: Two screws are supported by an medullary rod reducing the intertrochanteric fracture into near anatomic alignment. The orthopedic hardware appears well seated well aligned. IMPRESSION: 1. Well-aligned right proximal femur intertrochanteric fracture following ORIF. Electronically Signed   By: Amie Portland M.D.   On: 11/03/2015 08:51   Dg Hip Unilat W Or Wo Pelvis 2-3 Views Right  Result Date: 11/18/2015 CLINICAL DATA:  Recent hip surgery with palpable soft tissue abnormality EXAM: DG HIP (WITH OR WITHOUT PELVIS) 2-3V RIGHT COMPARISON:  11/03/2015 FINDINGS: Postoperative changes are again noted in the proximal right femur. No new fracture is seen. Mild soft tissue swelling is noted related to  the recent surgery. No other focal abnormality is seen. IMPRESSION: Postoperative change. Electronically Signed   By: Alcide Clever M.D.   On: 11/18/2015 14:22   Dg Femur Min 2 Views Right  Result Date: 11/01/2015 CLINICAL DATA:  Status post fall.  Right hip pain. EXAM: RIGHT FEMUR 2 VIEWS COMPARISON:  None. FINDINGS: Minimally displaced right intertrochanteric hip fracture. No other fracture or dislocation. Right knee arthroplasty. Peripheral vascular atherosclerotic disease. IMPRESSION: 1. Minimally displaced acute right intertrochanteric hip fracture. Electronically Signed   By: Elige Ko   On: 11/01/2015 16:53   Xr Hip Unilat W Or W/o Pelvis 2-3 Views Right  Result Date: 11/23/2015 Stable fixation of right intertroch hip fracture   Assessment/Plan:   S/P Repair of Closed displaced Fracture of right Femur.With Subcutaneous hematoma. Hematoma appears to be slowly resolving.  She is followed closely by orthopedics-she will need PT and OT when she goes home she has decided to stay a few extra days for in-house therapy . On low dose Norco for pain. Continue therapy.    Chronic CHF  This appears to be stable on Lasix 40 mg a day-her weight is stable at 138 edema is fairly minimal.  Atrial Fibrillation With RVR Eliquis  restarted on 11/08--hemoglobin and she shows improvement on lab done yesterday-she is on Toprol for rate control as well as amiodarone  Essential Hypertension Continue Toprol-blood pressures appear somewhat variable but I do not see consistent systolic elevations  Type 2 Diabetes Continue Accu chaecks--blood sugars appear to be mi lower mid 100s in the morning later in the day somewhat higher in the higher 100s low 200s range  at this point will monitor since she is about to go home but will need follow-up by primary care provider-at this point would like to avoid any chance of hypoglycemia since she is about to go home  She is on Amaryl 0.5 mg a  day.   Anemia HGB 10.1 shows improvement. Continue iron supplement.  UTI  Positive for Klebsiella most recently she's been treated with antibiotics-she does have a history of frequent UTIs will obtain a repeat urine culture-  History of hyponatremia this appears to be relatively stable at 132 on lab done yesterday will need follow-up by primary care provider.  Constipation this apparently has been stable now that she is on Amatine use-she is also on MiraLAX.  Previous history wheezing-this appears to be improved she does have duo nebs as needed daily.  History of dizziness this apparently has not been an issue during her stay here she does have Antivert when necessary   Patient is being discharged with the following home health services: She will need PT and OT again she is delaying her discharge till next week       Patient has been advised to f/u with their PCP in 1-2 weeks to bring them up to date on their rehab stay.  Social services at facility was responsible for arranging this appointment.  Pt will be provided with a 30 day supply of prescriptions for medications and refills must be obtained from their PCP.  For controlled substances, a more limited supply may be provided adequate until PCP appointment only.  [id number]-of note greater than 30 minutes spent on this discharge summary-greater than 50% of time spent coordinating plan of care for numerous diagnoses:

## 2015-11-30 LAB — URINE CULTURE

## 2015-11-30 NOTE — Progress Notes (Signed)
This encounter was created in error - please disregard.  This encounter was created in error - please disregard.

## 2015-12-03 ENCOUNTER — Non-Acute Institutional Stay (SKILLED_NURSING_FACILITY): Payer: PPO | Admitting: Internal Medicine

## 2015-12-03 ENCOUNTER — Encounter: Payer: Self-pay | Admitting: Internal Medicine

## 2015-12-03 DIAGNOSIS — E1122 Type 2 diabetes mellitus with diabetic chronic kidney disease: Secondary | ICD-10-CM

## 2015-12-03 DIAGNOSIS — I5042 Chronic combined systolic (congestive) and diastolic (congestive) heart failure: Secondary | ICD-10-CM | POA: Diagnosis not present

## 2015-12-03 DIAGNOSIS — I10 Essential (primary) hypertension: Secondary | ICD-10-CM | POA: Diagnosis not present

## 2015-12-03 DIAGNOSIS — I480 Paroxysmal atrial fibrillation: Secondary | ICD-10-CM | POA: Diagnosis not present

## 2015-12-03 DIAGNOSIS — S72141D Displaced intertrochanteric fracture of right femur, subsequent encounter for closed fracture with routine healing: Secondary | ICD-10-CM

## 2015-12-03 DIAGNOSIS — N183 Chronic kidney disease, stage 3 unspecified: Secondary | ICD-10-CM

## 2015-12-03 DIAGNOSIS — S7001XD Contusion of right hip, subsequent encounter: Secondary | ICD-10-CM | POA: Diagnosis not present

## 2015-12-03 NOTE — Progress Notes (Signed)
Location:   [facility] Room Number: 111/W Place of Service:  SNF [phone]) Provider:  Angela Cox., MD  Patient Care Team: Elfredia Nevins, MD as PCP - General (Internal Medicine) Romero Belling, MD as Consulting Physician (Endocrinology)  Extended Emergency Contact Information Primary Emergency Contact: Lourdes Sledge Address: [address]          [place], [place] [address] [place] Home Phone: [phone] Mobile Phone: [phone] Relation: Daughter Secondary Emergency Contact: Liliane Shi [place] Home Phone: [phone] Mobile Phone: [phone] Relation: Grandaughter  Code Status:  DNR Goals of care: Advanced Directive information Advanced Directives 12/03/2015  Does patient have an advance directive? Yes  Type of Advance Directive Out of facility DNR (pink MOST or yellow form)  Does patient want to make changes to advanced directive? No - Patient declined  Copy of advanced directive(s) in chart? Yes  Would patient like information on creating an advanced directive? -  Pre-existing out of facility DNR order (yellow form or pink MOST form) -     Chief Complaint  Patient presents with  . Acute Visit  To assess for possible discharge  HPI:  82 y.o. female  seen today for possible discharge-initial plan was to have her discharged later this week but apparently they've consulted with therapy can stay a few extra days and she and her daughter feel she would benefit from additional therapy and thus apparently will be going home next week  She was initially admitted to skilled nursing after she fell and had a right hip fracture that was surgically repaired.  She did have postop anemia and required transfusion She also had a UTI treated with antibiotics-she also had hyponatremia that was thought secondary to dehydration.  Also had significant constipation requiring disimpaction.  She was sent  back to the hospital after nursing staff noted swelling around her incision area of the right hip-CT scan showed she had a subcutaneous hematoma-and it was decided to hold her Eliquis which she had been on for atrial fibrillation this has since been restarted.  She again was started on antibiotics for Klebsiella UTI.   and was treated with a cephalosporin.  Her daughter is concerned about recurrent UTIs however and would like a repeat urine culture done-she is not currently complaining of overt dysuria but her daughter states she does have frequent recurrences.   .  .          Past Medical History:  Diagnosis Date  . Acid reflux   . 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 CAD (coronary artery disease)   . Chronic kidney disease (CKD), stage III (moderate)   . Essential hypertension   . History of pneumonia   . Hyperlipidemia   . Macular degeneration   . Nonischemic cardiomyopathy (HCC)   . PAF (paroxysmal atrial fibrillation) (HCC)   . Type 2 diabetes mellitus (HCC)          Past Surgical History:  Procedure Laterality Date  . Bladder tack  1970's  . CARDIAC CATHETERIZATION  10/15/2011   Normal coronaries  . CARDIOVERSION N/A 10/28/2013   Procedure: CARDIOVERSION;  Surgeon: Thurmon Fair, MD;  Location: [facility] ENDOSCOPY;  Service: Cardiovascular;  Laterality: N/A;  . CHOLECYSTECTOMY  1999  . INTRAMEDULLARY (IM) NAIL INTERTROCHANTERIC Right 11/03/2015   Procedure: INTRAMEDULLARY (IM) NAIL RIGHT INTERTROCHANTERIC HIP FRACTURE;  Surgeon: Tarry Kos, MD;  Location: [facility] OR;  Service: Orthopedics;  Laterality: Right;  . LEFT AND RIGHT HEART CATHETERIZATION WITH CORONARY ANGIOGRAM N/A 10/15/2011   Procedure: LEFT AND RIGHT HEART CATHETERIZATION WITH CORONARY ANGIOGRAM;  Surgeon: Chrystie Nose, MD;  Location: [facility] CATH LAB;  Service: Cardiovascular;  Laterality: N/A;  . NM MYOCAR PERF WALL MOTION  03/15/2009   Normal  . PACEMAKER  INSERTION  10/16/2011   Medtronic  . PERMANENT PACEMAKER INSERTION N/A 03/24/2011   Procedure: PERMANENT PACEMAKER INSERTION;  Surgeon: Thurmon Fair, MD;  Location: [facility] CATH LAB;  Service: Cardiovascular;  Laterality: N/A;  . REPLACEMENT TOTAL KNEE        reports that she has never smoked. She has never used smokeless tobacco. She reports that she does not drink alcohol or use drugs. Social History        Social History  . Marital status: Widowed    Spouse name: N/A  . Number of children: N/A  . Years of education: N/A       Occupational History  . retired        Social History Main Topics  . Smoking status: Never Smoker  . Smokeless tobacco: Never Used  . Alcohol use No  . Drug use: No  . Sexual activity: Not Currently       Other Topics Concern  . Not on file      Social History Narrative  . No narrative on file   Functional Status Survey:         Allergies  Allergen Reactions  . Coumadin [Warfarin Sodium] Other (See Comments)    Caused Patient to Bleed Out.   . Meloxicam Other (See Comments)    Unknown  . Metformin And Related Other (See Comments)    Cannot have due to kidney function  . Morphine Itching    Not in right state of mind.   . Nabumetone Nausea And Vomiting  . Oxycodone Nausea And Vomiting  . Sulfonamide Derivatives Hives        Pertinent  Health Maintenance Due  Topic Date Due  . FOOT EXAM  12/12/1934  . OPHTHALMOLOGY EXAM  12/12/1934  . URINE MICROALBUMIN  12/12/1934  . PNA vac Low Risk Adult (2 of 2 - PPSV23) 12/14/2015 (Originally 09/14/2014)  . HEMOGLOBIN A1C  05/02/2016  . INFLUENZA VACCINE  Completed  . DEXA SCAN  Completed    Medications:       Medication List           Accurate as of 11/28/15  3:34 PM. Always use your most recent med list.           acetaminophen 325 MG tablet Commonly known as:  TYLENOL Take 650 mg by mouth every 4 (four) hours as needed.  amiodarone 200 MG  tablet Commonly known as:  PACERONE Take 0.5 tablets (100 mg total) by mouth daily.  apixaban 2.5 MG Tabs tablet Commonly known as:  ELIQUIS Take 1 tablet (2.5 mg total) by mouth 2 (two) times daily.  calcium-vitamin D 500-200 MG-UNIT tablet Take 1 tablet by mouth 2 (two) times daily.  CEROVITE SENIOR Tabs Give 1 tablet by mouth daily  ICAPS AREDS 2 Caps Take 1 capsule by mouth twice daily  CO Q 10 PO Take 1 tablet by mouth daily.  feeding supplement (ENSURE ENLIVE) Liqd Take 237 mLs by mouth 2 (two) times daily between meals.  ferrous sulfate 325 (65 FE) MG tablet Take 325 mg by mouth 2 (two) times daily with a meal.  furosemide 40 MG tablet Commonly known  as:  LASIX Take 40 mg by mouth daily.  glimepiride 1 MG tablet Commonly known as:  AMARYL Take 0.5 mg by mouth daily with breakfast.  ipratropium-albuterol 0.5-2.5 (3) MG/3ML Soln Commonly known as:  DUONEB Take 3 mLs by nebulization daily as needed.  lubiprostone 24 MCG capsule Commonly known as:  AMITIZA Take 24 mcg by mouth 2 (two) times daily with a meal.  meclizine 25 MG tablet Commonly known as:  ANTIVERT Take 25 mg by mouth 3 (three) times daily as needed for dizziness.  metoprolol succinate 50 MG 24 hr tablet Commonly known as:  TOPROL-XL Take 1 tablet (50 mg total) by mouth daily. Take with or immediately following a meal.  nitroGLYCERIN 0.4 MG SL tablet Commonly known as:  NITROSTAT DISSOLVE 1 TABLET UNDER TONGUE EVERY 5 MINUTES UP TO 15 MIN FOR CHESTPAIN. IF NO RELIEF CALL 911.  omeprazole 20 MG capsule Commonly known as:  PRILOSEC TAKE ONE CAPSULE BY MOUTH DAILY.  ondansetron 4 MG tablet Commonly known as:  ZOFRAN Take 4 mg by mouth every 4 (four) hours as needed for nausea or vomiting.  polyethylene glycol packet Commonly known as:  MIRALAX / GLYCOLAX Take 17 g by mouth daily as needed.  pravastatin 40 MG tablet Commonly known as:  PRAVACHOL TAKE 1 TABLET BY MOUTH EACH EVENING.  ROBITUSSIN  COUGH+CHEST CONG DM 5-100 MG/5ML Liqd Generic drug:  Dextromethorphan-Guaifenesin Give 15 ml by mouth every 4 hours prn      Review of Systems  Constitutional: thinks she has a poor appetite but weight has been stable -- nursing staff says she eats fairly well and takes supplements very well. Negative for chills, diaphoresis, feverand unexpected weight change.  Respiratory: Negative for apnea, cough, choking, chest tightness, shortness of breath, wheezingand stridor.  Cardiovascular: Negative for chest pain, palpitationsand leg swelling.  Gastrointestinal: Negative for abdominal distention, abdominal pain, anal bleeding, blood in stool, constipation, diarrhea, nausea, rectal painand vomiting.  Genitourinary: Does not complaini of overt dysuria but has frequent UTIs Musculoskeletal: Positive for joint swelling which has improved. Negative for arthralgias, back pain, gait problem, myalgiasand neck pain. Joint pain appears to be controlled with current medications  Neurological: Positive for weakness. Negative for dizzinessand light-headedness.  Psychiatric/Behavioral: Negative for agitation, behavioral problems, decreased concentrationand hallucinations. The patient is not nervous/anxious.     Vitals:   11/28/15 1511  BP: (!) 144/61  Pulse: 73  Resp: (!) 24  Temp: 97.4 F (36.3 C)  TempSrc: Oral  Blood pressures appear to very 136/57-131/51-154/59 Weight appears to be relatively stable at 142 pounds Physical Exam  Constitutional: She is oriented to person, place, and time. She appears well-nourished. Sitting comfortably in her wheelchair HENT: Visual acuity appears intact- Head: Normocephalic.  Mouth/Throat: Oropharynx is clear and moist.  Cardiovascular: Normal rate, regular rhythmand normal heart sounds.  No murmurheard. Pulmonary/Chest: Effort normal. Clear to auscultation She has no rales very minimal wheeze ron right  .  Abdominal: Soft. Bowel sounds are  normal. She exhibits no distensionand no mass. There is no tenderness. There is no reboundand no guarding.  Musculoskeletal: She exhibits minimal edema-pedal pulses intact Surgical site right hip has a well-healed small surgical scar there is still some elevation here with firmness but apparently this has improved it is non-warm nontender nonerythematous   has covering over her right heel with a history  of a stage I ulcer  Neurological: She is alertand oriented to person, place, and time.  Skin: Skin is warm. No erythema  Labs reviewed: Basic Metabolic Panel:  Recent Labs (within last 365 days)   Recent Labs  05/04/15 1055  11/18/15 1301 11/19/15 0552 11/27/15 0500  NA 134*  < > 132* 133* 132*  K 4.0  < > 4.7 3.6 3.7  CL 90*  < > 96* 100* 98*  CO2 33*  < > 28 26 27   GLUCOSE 233*  < > 220* 157* 133*  BUN 21*  < > 22* 17 21*  CREATININE 1.47*  < > 1.13* 0.94 1.01*  CALCIUM 9.2  < > 8.9 8.3* 8.6*  MG 1.7  --   --   --   --   < > = values in this interval not displayed.   Liver Function Tests:  Recent Labs (within last 365 days)   Recent Labs  11/01/15 1658 11/12/15 0700 11/18/15 1301  AST 24 38 64*  ALT 22 33 57*  ALKPHOS 120 132* 212*  BILITOT 0.8 1.5* 1.0  PROT 6.7 5.1* 6.5  ALBUMIN 3.4* 2.5* 3.4*     Recent Labs (within last 365 days)  No results for input(s): LIPASE, AMYLASE in the last 8760 hours.   Recent Labs (within last 365 days)  No results for input(s): AMMONIA in the last 8760 hours.   CBC:  Recent Labs (within last 365 days)   Recent Labs  01/03/15 1154  11/01/15 1658  11/18/15 1301 11/19/15 0552 11/27/15 0500  WBC 13.8*  < > 11.8*  < > 15.0* 9.6 7.7  NEUTROABS 11.7*  --  10.1*  --  12.9*  --   --   HGB 11.4*  < > 11.5*  < > 10.7* 9.0* 10.1*  HCT 34.1*  < > 35.3*  < > 33.7* 27.7* 30.7*  MCV 94.2  < > 98.3  < > 100.9* 100.0 100.7*  PLT 322  < > 263  < > 510* 418* 274  < > = values in this interval not displayed.   Cardiac  Enzymes:  Recent Labs (within last 365 days)   Recent Labs  05/01/15 0010 05/01/15 0256 05/01/15 0556  TROPONINI <0.03 <0.03 <0.03     BNP: Recent Labs (within last 365 days)  Invalid input(s): POCBNP   CBG:  Recent Labs (within last 365 days)   Recent Labs  11/07/15 1725 11/19/15 0809 11/19/15 1116  GLUCAP 175* 180* 147*      Procedures and Imaging Studies During Stay:  Imaging Results  Dg Chest 1 View  Result Date: 11/01/2015 CLINICAL DATA:  82 year old female with history of trauma from a fall with pain in the right hip and right femur. EXAM: CHEST 1 VIEW COMPARISON:  Chest x-ray 05/03/2015. FINDINGS: Diffuse peribronchial cuffing. Upper lobe volume loss as evidenced by upward retraction of the minor fissure and upward retraction of hilar structures bilaterally. Diffuse interstitial prominence, most evident throughout the mid to upper lungs, where there is a reticulonodular pattern which has progressively increased in prominence over several prior examinations. Lower lungs are relatively clear. No pleural effusions. No evidence of pulmonary edema no pneumothorax. Heart size is mildly enlarged. Upper mediastinal contours are within normal limits. Atherosclerosis in the thoracic aorta. Left-sided biventricular pacemaker device in place with lead tips projecting over the expected location of the right atrium, right ventricle, and overlying the lateral wall the left ventricle via the coronary sinus and coronary veins. Bony thorax appears grossly intact. IMPRESSION: 1. No definite evidence to suggest significant acute traumatic injury to the thorax. Highly unusual appearance of the  lungs, as above. This appearance can be seen in the setting of sarcoidosis. Other differential considerations include sequela of chronic hypersensitivity pneumonitis or sequela of chronic recurrent infections. These findings could be best evaluated with followup nonemergent high-resolution chest CT  if clinically appropriate. 2. Mild cardiomegaly. 3. Aortic atherosclerosis. Electronically Signed   By: Trudie Reedaniel  Entrikin M.D.   On: 11/01/2015 17:00   Dg Pelvis 1-2 Views  Result Date: 11/01/2015 CLINICAL DATA:  Fall.  Pain. EXAM: PELVIS - 1-2 VIEW COMPARISON:  03/12/2015. FINDINGS: A fracture along the greater trochanter of the right femur is noted. This is new from prior study of 03/12/2015. Right hip series suggested for further evaluation. Old right inferior pubic ramus fracture. Degenerative changes lumbar spine and both hips. Diffuse osteopenia. IMPRESSION: A fracture is noted along the greater trochanter of the right femur. This is new from prior study 03/12/2015. Right hip series suggested for further evaluation. Electronically Signed   By: Maisie Fushomas  Register   On: 11/01/2015 16:55   Ct Hip Right Wo Contrast  Result Date: 11/02/2015 CLINICAL DATA:  82 year old female right hip fracture. EXAM: CT OF THE RIGHT HIP WITHOUT CONTRAST TECHNIQUE: Multidetector CT imaging of the right hip was performed according to the standard protocol. Multiplanar CT image reconstructions were also generated. COMPARISON:  Radiograph dated 11/01/2015 FINDINGS: Bones/Joint/Cartilage There is a comminuted intratrochanteric fracture of the right femur with mild proximal migration and impaction of the femoral shaft and the femoral neck. There is associated mild varus angulation. Evaluation of the fracture is however limited due to advanced osteopenia. There is mild posterior displacement of the fracture fragment of the greater trochanter. There is no dislocation. Ligaments Suboptimally assessed by CT. Muscles and Tendons Mild edema of the right thigh musculature. No fluid collection or hematoma. Soft tissues No fluid collection or hematoma. A Foley catheter is noted within the bladder. There is scattered colonic diverticula. IMPRESSION: Comminuted intertrochanteric fracture of the right femur. Advanced osteopenia.  Electronically Signed   By: Elgie CollardArash  Radparvar M.D.   On: 11/02/2015 03:31   Ct Hip Right W Contrast  Result Date: 11/18/2015 CLINICAL DATA:  Right hip pain and swelling at the surgical site. Recent intertrochanteric fracture with gamma nail and compression screw fixation. EXAM: CT OF THE RIGHT HIP WITH CONTRAST TECHNIQUE: Multidetector CT imaging was performed following the standard protocol during bolus administration of intravenous contrast. CONTRAST:  75mL ISOVUE-300 IOPAMIDOL (ISOVUE-300) INJECTION 61% COMPARISON:  Pelvic CT scan 11/01/2015 FINDINGS: Expected postoperative changes from recent gamma nail placement and proximal compression screw fixation of a complex comminuted intertrochanteric fracture. No complicating features associated with the hardware are identified. Remote healed right-sided pubic rami fractures. No new obvious intrapelvic abnormalities are identified There is a bilobed subcutaneous hematoma underlying the upper surgical skin staples. This measures 8.8 x 4.4 cm. It measures 65 Hounsfield units and is no worrisome enhancement to suggest this is an abscess. There is a second smaller hematoma noted underlying the lower surgical skin staples in the region of the dynamic hip screws. There is also skin thickening and fairly extensive subcutaneous soft tissue swelling/edema and fluid. I do not see any findings worrisome for abscess. Small hip joint effusion. IMPRESSION: 1. Expected postoperative changes from recent hip surgery. No complicating features associated with the hardware. 2. Subcutaneous hematomas in the right hip area without CT findings to suggest an abscess. Electronically Signed   By: Rudie MeyerP.  Gallerani M.D.   On: 11/18/2015 16:42   Dg Abd Portable 1v  Result Date: 11/06/2015  CLINICAL DATA:  Abdominal distension constipation. Recent hip surgery. EXAM: PORTABLE ABDOMEN - 1 VIEW COMPARISON:  None. FINDINGS: Single supine view of the abdomen and pelvis. Pacer. Cardiomegaly. No  gaseous distention of bowel loops. Minimal motion degradation. Moderate amount of distal stool. Incompletely imaged right proximal femoral fixation. Probable cholecystectomy. Indeterminate calcification projecting lateral to the L4-5 level. IMPRESSION: Mildly motion degraded exam. No evidence of bowel obstruction. Colonic stool burden suggests constipation. Electronically Signed   By: Jeronimo Greaves M.D.   On: 11/06/2015 11:09   Dg C-arm 1-60 Min  Result Date: 11/03/2015 CLINICAL DATA:  Operative imaging for right proximal femur fracture ORIF. EXAM: DG C-ARM 61-120 MIN; OPERATIVE RIGHT HIP WITH PELVIS COMPARISON:  11/01/2015. FINDINGS: Two screws are supported by an medullary rod reducing the intertrochanteric fracture into near anatomic alignment. The orthopedic hardware appears well seated well aligned. IMPRESSION: 1. Well-aligned right proximal femur intertrochanteric fracture following ORIF. Electronically Signed   By: Amie Portland M.D.   On: 11/03/2015 08:51   Dg Hip Operative Unilat W Or W/o Pelvis Right  Result Date: 11/03/2015 CLINICAL DATA:  Operative imaging for right proximal femur fracture ORIF. EXAM: DG C-ARM 61-120 MIN; OPERATIVE RIGHT HIP WITH PELVIS COMPARISON:  11/01/2015. FINDINGS: Two screws are supported by an medullary rod reducing the intertrochanteric fracture into near anatomic alignment. The orthopedic hardware appears well seated well aligned. IMPRESSION: 1. Well-aligned right proximal femur intertrochanteric fracture following ORIF. Electronically Signed   By: Amie Portland M.D.   On: 11/03/2015 08:51   Dg Hip Unilat W Or Wo Pelvis 2-3 Views Right  Result Date: 11/18/2015 CLINICAL DATA:  Recent hip surgery with palpable soft tissue abnormality EXAM: DG HIP (WITH OR WITHOUT PELVIS) 2-3V RIGHT COMPARISON:  11/03/2015 FINDINGS: Postoperative changes are again noted in the proximal right femur. No new fracture is seen. Mild soft tissue swelling is noted related to the recent  surgery. No other focal abnormality is seen. IMPRESSION: Postoperative change. Electronically Signed   By: Alcide Clever M.D.   On: 11/18/2015 14:22   Dg Femur Min 2 Views Right  Result Date: 11/01/2015 CLINICAL DATA:  Status post fall.  Right hip pain. EXAM: RIGHT FEMUR 2 VIEWS COMPARISON:  None. FINDINGS: Minimally displaced right intertrochanteric hip fracture. No other fracture or dislocation. Right knee arthroplasty. Peripheral vascular atherosclerotic disease. IMPRESSION: 1. Minimally displaced acute right intertrochanteric hip fracture. Electronically Signed   By: Elige Ko   On: 11/01/2015 16:53   Xr Hip Unilat W Or W/o Pelvis 2-3 Views Right  Result Date: 11/23/2015 Stable fixation of right intertroch hip fracture    Assessment/Plan:   S/P Repair of Closed displaced Fracture of right Femur.With Subcutaneous hematoma. Hematoma appears to be slowly resolving.  She is followed closely by orthopedics-she will need PT and OT when she goes home she has decided to stay a few extra days for in-house therapy . On low dose Norco for pain. Continue therapy.    Chronic CHF  This appears to be stable on Lasix 40 mg a day-her weight is stable at 142 edema is fairly minimal.  Atrial Fibrillation With RVR Eliquis  restarted on 11/08--hemoglobin and she shows improvement on lab done yesterday-she is on Toprol for rate control as well as amiodarone  Essential Hypertension Continue Toprol-blood pressures appear somewhat variable but I do not see consistent systolic elevations  Type 2 Diabetes Continue Accu chaecks--blood sugars appear to be mi lower mid 100s in the morning later in the day somewhat higher in  the higher 100s low 200s range at this point will monitor since she is about to go home but will need follow-up by primary care provider-at this point would like to avoid any chance of hypoglycemia since she is about to go home  She is on Amaryl 0.5 mg a  day.   Anemia HGB 10.1 shows improvement. Continue iron supplement.  UTI  Positive for Klebsiella most recently she's been treated with antibiotics-she does have a history of frequent UTIs will obtain a repeat urine culture-  History of hyponatremia this appears to be relatively stable at 132 on lab done yesterday will need follow-up by primary care provider.  Constipation this apparently has been stable now that she is on Amatine use-she is also on MiraLAX.  Previous history wheezing-this appears to be improved she does have duo nebs as needed daily.  History of dizziness this apparently has not been an issue during her stay here she does have Antivert when necessary    patient's discharge will be delayed secondary to the thought she would benefit from more physical therapy and family is certainly in agreement with this  [id number]-of note greater than 25 minutes spent assessing patient-reviewing her chart-reviewing her labs-discussing her status with nursing staff and coordinating plan of care for numerous diagnoses-of note greater than 50% of time spent coordinating plan of care                   Review of Systems        Physical Exam      Assessment/Plan There are no diagnoses linked to this encounter.       [phone]

## 2015-12-03 NOTE — Progress Notes (Signed)
Location:   [facility] Room Number: 111/W Place of Service:  SNF (31)  Provider: Edmon Crape  PCP: [name]., MD Patient Care Team: Elfredia Nevins, MD as PCP - General (Internal Medicine) Romero Belling, MD as Consulting Physician (Endocrinology)  Extended Emergency Contact Information Primary Emergency Contact: Lourdes Sledge Address: [address]          [place], [place] [address] [place] Home Phone: [phone] Mobile Phone: [phone] Relation: Daughter Secondary Emergency Contact: Liliane Shi [place] Home Phone: [phone] Mobile Phone: [phone] Relation: Grandaughter  Code Status: DNR Goals of care:  Advanced Directive information Advanced Directives 12/03/2015  Does patient have an advance directive? Yes  Type of Advance Directive Out of facility DNR (pink MOST or yellow form)  Does patient want to make changes to advanced directive? No - Patient declined  Copy of advanced directive(s) in chart? Yes  Would patient like information on creating an advanced directive? -  Pre-existing out of facility DNR order (yellow form or pink MOST form) -     Allergies  Allergen Reactions  . Coumadin [Warfarin Sodium] Other (See Comments)    Caused Patient to Bleed Out.   . Meloxicam Other (See Comments)    Unknown  . Metformin And Related Other (See Comments)    Cannot have due to kidney function  . Morphine Itching    Not in right state of mind.   . Nabumetone Nausea And Vomiting  . Oxycodone Nausea And Vomiting  . Sulfonamide Derivatives Hives    Chief Complaint  Patient presents with  . Discharge Note    HPI:  82 y.o. female  seen today for discharge--she was originally slated for discharge last week but thought a few days of therapy would benefit her and she feels sad as actually been the case.  She will be going home as previously indicated-her status since last week has been quite  stable we did order repeat urine culture since she had been treated for UTI and this appeared to be fairly benign     She was initially admitted to skilled nursing after she fell and had a right hip fracture that was surgically repaired.  She did have postop anemia and required confusion. She also had a UTI treated with antibiotics-she also had hyponatremia that was thought secondary to dehydration.  Also had significant constipation requiring disimpaction.  She was sent back to the hospital after nursing staff noted swelling around her incision area of the right hip-CT scan showed she had a subcutaneous hematoma-and it was decided to hold her Eliquis which she had been on for atrial fibrillation this has since been restarted.  She again was started on antibiotics for Klebsiella UTI.  She has completed this and we have done repeat urine culture which appeared to be negative  Her status has been stable since last week she will be going home as previously discussed    .          Past Medical History:  Diagnosis Date  . Acid reflux   . 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 CAD (coronary artery disease)   . Chronic kidney disease (CKD), stage III (moderate)   . Essential hypertension   . History of pneumonia   . Hyperlipidemia   . Macular degeneration   . Nonischemic cardiomyopathy (HCC)   . PAF (paroxysmal atrial fibrillation) (HCC)   . Type 2 diabetes mellitus (HCC)  Past Surgical History:  Procedure Laterality Date  . Bladder tack  1970's  . CARDIAC CATHETERIZATION  10/15/2011   Normal coronaries  . CARDIOVERSION N/A 10/28/2013   Procedure: CARDIOVERSION;  Surgeon: Thurmon Fair, MD;  Location: [facility] ENDOSCOPY;  Service: Cardiovascular;  Laterality: N/A;  . CHOLECYSTECTOMY  1999  . INTRAMEDULLARY (IM) NAIL INTERTROCHANTERIC Right 11/03/2015   Procedure: INTRAMEDULLARY (IM) NAIL RIGHT INTERTROCHANTERIC HIP  FRACTURE;  Surgeon: Tarry Kos, MD;  Location: [facility] OR;  Service: Orthopedics;  Laterality: Right;  . LEFT AND RIGHT HEART CATHETERIZATION WITH CORONARY ANGIOGRAM N/A 10/15/2011   Procedure: LEFT AND RIGHT HEART CATHETERIZATION WITH CORONARY ANGIOGRAM;  Surgeon: Chrystie Nose, MD;  Location: [name] CATH LAB;  Service: Cardiovascular;  Laterality: N/A;  . NM MYOCAR PERF WALL MOTION  03/15/2009   Normal  . PACEMAKER INSERTION  10/16/2011   Medtronic  . PERMANENT PACEMAKER INSERTION N/A 03/24/2011   Procedure: PERMANENT PACEMAKER INSERTION;  Surgeon: Thurmon Fair, MD;  Location: [facility] CATH LAB;  Service: Cardiovascular;  Laterality: N/A;  . REPLACEMENT TOTAL KNEE        reports that she has never smoked. She has never used smokeless tobacco. She reports that she does not drink alcohol or use drugs. Social History        Social History  . Marital status: Widowed    Spouse name: N/A  . Number of children: N/A  . Years of education: N/A       Occupational History  . retired        Social History Main Topics  . Smoking status: Never Smoker  . Smokeless tobacco: Never Used  . Alcohol use No  . Drug use: No  . Sexual activity: Not Currently       Other Topics Concern  . Not on file      Social History Narrative  . No narrative on file   Functional Status Survey:         Allergies  Allergen Reactions  . Coumadin [Warfarin Sodium] Other (See Comments)    Caused Patient to Bleed Out.   . Meloxicam Other (See Comments)    Unknown  . Metformin And Related Other (See Comments)    Cannot have due to kidney function  . Morphine Itching    Not in right state of mind.   . Nabumetone Nausea And Vomiting  . Oxycodone Nausea And Vomiting  . Sulfonamide Derivatives Hives        Pertinent  Health Maintenance Due  Topic Date Due  . FOOT EXAM  12/12/1934  . OPHTHALMOLOGY EXAM  12/12/1934  . URINE MICROALBUMIN  12/12/1934  . PNA vac Low Risk Adult (2 of  2 - PPSV23) 12/14/2015 (Originally 09/14/2014)  . HEMOGLOBIN A1C  05/02/2016  . INFLUENZA VACCINE  Completed  . DEXA SCAN  Completed    Medications:       Medication List                     acetaminophen 325 MG tablet Commonly known as:  TYLENOL Take 650 mg by mouth every 4 (four) hours as needed.  amiodarone 200 MG tablet Commonly known as:  PACERONE Take 0.5 tablets (100 mg total) by mouth daily.  apixaban 2.5 MG Tabs tablet Commonly known as:  ELIQUIS Take 1 tablet (2.5 mg total) by mouth 2 (two) times daily.  calcium-vitamin D 500-200 MG-UNIT tablet Take 1 tablet by mouth 2 (two) times daily.  CEROVITE SENIOR Tabs Give 1 tablet  by mouth daily  ICAPS AREDS 2 Caps Take 1 capsule by mouth twice daily  CO Q 10 PO Take 1 tablet by mouth daily.  feeding supplement (ENSURE ENLIVE) Liqd Take 237 mLs by mouth 2 (two) times daily between meals.  ferrous sulfate 325 (65 FE) MG tablet Take 325 mg by mouth 2 (two) times daily with a meal.  furosemide 40 MG tablet Commonly known as:  LASIX Take 40 mg by mouth daily.  glimepiride 1 MG tablet Commonly known as:  AMARYL Take 0.5 mg by mouth daily with breakfast.  ipratropium-albuterol 0.5-2.5 (3) MG/3ML Soln Commonly known as:  DUONEB Take 3 mLs by nebulization daily as needed.  lubiprostone 24 MCG capsule Commonly known as:  AMITIZA Take 24 mcg by mouth 2 (two) times daily with a meal.  meclizine 25 MG tablet Commonly known as:  ANTIVERT Take 25 mg by mouth 3 (three) times daily as needed for dizziness.  metoprolol succinate 50 MG 24 hr tablet Commonly known as:  TOPROL-XL Take 1 tablet (50 mg total) by mouth daily. Take with or immediately following a meal.  nitroGLYCERIN 0.4 MG SL tablet Commonly known as:  NITROSTAT DISSOLVE 1 TABLET UNDER TONGUE EVERY 5 MINUTES UP TO 15 MIN FOR CHESTPAIN. IF NO RELIEF CALL 911.  omeprazole 20 MG capsule Commonly known as:  PRILOSEC TAKE ONE CAPSULE BY MOUTH DAILY.    ondansetron 4 MG tablet Commonly known as:  ZOFRAN Take 4 mg by mouth every 4 (four) hours as needed for nausea or vomiting.  polyethylene glycol packet Commonly known as:  MIRALAX / GLYCOLAX Take 17 g by mouth daily as needed.  pravastatin 40 MG tablet Commonly known as:  PRAVACHOL TAKE 1 TABLET BY MOUTH EACH EVENING.  ROBITUSSIN COUGH+CHEST CONG DM 5-100 MG/5ML Liqd Generic drug:  Dextromethorphan-Guaifenesin Give 15 ml by mouth every 4 hours prn      Review of Systems  Constitutional:  weight has been stable -- nursing staff says she eats fairly well and takes supplements  well. Negative for chills, diaphoresis, feverand unexpected weight change.  Respiratory: Negative for apnea, cough, choking, chest tightness, shortness of breath, wheezingand stridor.  Cardiovascular: Negative for chest pain, palpitationsand leg swelling.  Gastrointestinal: Negative for abdominal distention, abdominal pain, anal bleeding, blood in stool, constipation, diarrhea, nausea, rectal painand vomiting.  Genitourinary: Does not complaini of  dysuria but has frequent UTIs Musculoskeletal: Positive for joint swelling which has improved. Negative for arthralgias, back pain, gait problem, myalgiasand neck pain. Joint pain appears to be controlled with current medications  Neurological: Positive for weakness. Negative for dizzinessand light-headedness.  Psychiatric/Behavioral: Negative for agitation, behavioral problems, decreased concentrationand hallucinations. The patient is not nervous/anxious.   Physical exam.  Temperature is 98.2 pulse 73 respirations 20 blood pressure 144/55 O2 saturation is 100% on room air  Blood pressures appear to vary  1 [phone] /47-126/57 most recently  Weight appears to be relatively stable at 142 pounds  Physical Exam  Constitutional: She is oriented to person, place, and time. She appears well-nourished. Currently lying in bed HENT: Visual acuity appears  intact- Head: Normocephalic.  Mouth/Throat: Oropharynx is clear and moist.  Cardiovascular: Normal rate, regular rhythmand normal heart sounds.  No murmurheard. Pulmonary/Chest: Effort normal. Clear to auscultation She has no rales -quite minimal right-sided wheeze.  Abdominal: Soft. Bowel sounds are normal. She exhibits no distensionand no mass. There is no tenderness. There is no reboundand no guarding.  Musculoskeletal: She exhibits minimal edema-pedal pulses intact Surgical site right  hip has a well-healed small surgical scar there is still some elevation here with firmness -- it is non-warm nontender nonerythematous   has covering over her heels bilat  with a history of blisters  Neurological: She is alertand oriented to person, place, and time.  Skin: Skin is warm. No erythema  Labs reviewed: Basic Metabolic Panel:  Recent Labs (within last 365 days)   Recent Labs  05/04/15 1055  11/18/15 1301 11/19/15 0552 11/27/15 0500  NA 134*  < > 132* 133* 132*  K 4.0  < > 4.7 3.6 3.7  CL 90*  < > 96* 100* 98*  CO2 33*  < > 28 26 27   GLUCOSE 233*  < > 220* 157* 133*  BUN 21*  < > 22* 17 21*  CREATININE 1.47*  < > 1.13* 0.94 1.01*  CALCIUM 9.2  < > 8.9 8.3* 8.6*  MG 1.7  --   --   --   --   < > = values in this interval not displayed.   Liver Function Tests:  Recent Labs (within last 365 days)   Recent Labs  11/01/15 1658 11/12/15 0700 11/18/15 1301  AST 24 38 64*  ALT 22 33 57*  ALKPHOS 120 132* 212*  BILITOT 0.8 1.5* 1.0  PROT 6.7 5.1* 6.5  ALBUMIN 3.4* 2.5* 3.4*     Recent Labs (within last 365 days)  No results for input(s): LIPASE, AMYLASE in the last 8760 hours.   Recent Labs (within last 365 days)  No results for input(s): AMMONIA in the last 8760 hours.   CBC:  Recent Labs (within last 365 days)   Recent Labs  01/03/15 1154  11/01/15 1658  11/18/15 1301 11/19/15 0552 11/27/15 0500  WBC 13.8*  < > 11.8*  < > 15.0* 9.6 7.7    NEUTROABS 11.7*  --  10.1*  --  12.9*  --   --   HGB 11.4*  < > 11.5*  < > 10.7* 9.0* 10.1*  HCT 34.1*  < > 35.3*  < > 33.7* 27.7* 30.7*  MCV 94.2  < > 98.3  < > 100.9* 100.0 100.7*  PLT 322  < > 263  < > 510* 418* 274  < > = values in this interval not displayed.   Cardiac Enzymes:  Recent Labs (within last 365 days)   Recent Labs  05/01/15 0010 05/01/15 0256 05/01/15 0556  TROPONINI <0.03 <0.03 <0.03     BNP: Recent Labs (within last 365 days)  Invalid input(s): POCBNP   CBG:  Recent Labs (within last 365 days)   Recent Labs  11/07/15 1725 11/19/15 0809 11/19/15 1116  GLUCAP 175* 180* 147*      Procedures and Imaging Studies During Stay:  Imaging Results  Dg Chest 1 View  Result Date: 11/01/2015 CLINICAL DATA:  82 year old female with history of trauma from a fall with pain in the right hip and right femur. EXAM: CHEST 1 VIEW COMPARISON:  Chest x-ray 05/03/2015. FINDINGS: Diffuse peribronchial cuffing. Upper lobe volume loss as evidenced by upward retraction of the minor fissure and upward retraction of hilar structures bilaterally. Diffuse interstitial prominence, most evident throughout the mid to upper lungs, where there is a reticulonodular pattern which has progressively increased in prominence over several prior examinations. Lower lungs are relatively clear. No pleural effusions. No evidence of pulmonary edema no pneumothorax. Heart size is mildly enlarged. Upper mediastinal contours are within normal limits. Atherosclerosis in the thoracic aorta. Left-sided biventricular pacemaker device  in place with lead tips projecting over the expected location of the right atrium, right ventricle, and overlying the lateral wall the left ventricle via the coronary sinus and coronary veins. Bony thorax appears grossly intact. IMPRESSION: 1. No definite evidence to suggest significant acute traumatic injury to the thorax. Highly unusual appearance of the lungs, as  above. This appearance can be seen in the setting of sarcoidosis. Other differential considerations include sequela of chronic hypersensitivity pneumonitis or sequela of chronic recurrent infections. These findings could be best evaluated with followup nonemergent high-resolution chest CT if clinically appropriate. 2. Mild cardiomegaly. 3. Aortic atherosclerosis. Electronically Signed   By: Trudie Reed M.D.   On: 11/01/2015 17:00   Dg Pelvis 1-2 Views  Result Date: 11/01/2015 CLINICAL DATA:  Fall.  Pain. EXAM: PELVIS - 1-2 VIEW COMPARISON:  03/12/2015. FINDINGS: A fracture along the greater trochanter of the right femur is noted. This is new from prior study of 03/12/2015. Right hip series suggested for further evaluation. Old right inferior pubic ramus fracture. Degenerative changes lumbar spine and both hips. Diffuse osteopenia. IMPRESSION: A fracture is noted along the greater trochanter of the right femur. This is new from prior study 03/12/2015. Right hip series suggested for further evaluation. Electronically Signed   By: Maisie Fus  Register   On: 11/01/2015 16:55   Ct Hip Right Wo Contrast  Result Date: 11/02/2015 CLINICAL DATA:  82 year old female right hip fracture. EXAM: CT OF THE RIGHT HIP WITHOUT CONTRAST TECHNIQUE: Multidetector CT imaging of the right hip was performed according to the standard protocol. Multiplanar CT image reconstructions were also generated. COMPARISON:  Radiograph dated 11/01/2015 FINDINGS: Bones/Joint/Cartilage There is a comminuted intratrochanteric fracture of the right femur with mild proximal migration and impaction of the femoral shaft and the femoral neck. There is associated mild varus angulation. Evaluation of the fracture is however limited due to advanced osteopenia. There is mild posterior displacement of the fracture fragment of the greater trochanter. There is no dislocation. Ligaments Suboptimally assessed by CT. Muscles and Tendons Mild edema of the  right thigh musculature. No fluid collection or hematoma. Soft tissues No fluid collection or hematoma. A Foley catheter is noted within the bladder. There is scattered colonic diverticula. IMPRESSION: Comminuted intertrochanteric fracture of the right femur. Advanced osteopenia. Electronically Signed   By: Elgie Collard M.D.   On: 11/02/2015 03:31   Ct Hip Right W Contrast  Result Date: 11/18/2015 CLINICAL DATA:  Right hip pain and swelling at the surgical site. Recent intertrochanteric fracture with gamma nail and compression screw fixation. EXAM: CT OF THE RIGHT HIP WITH CONTRAST TECHNIQUE: Multidetector CT imaging was performed following the standard protocol during bolus administration of intravenous contrast. CONTRAST:  75mL ISOVUE-300 IOPAMIDOL (ISOVUE-300) INJECTION 61% COMPARISON:  Pelvic CT scan 11/01/2015 FINDINGS: Expected postoperative changes from recent gamma nail placement and proximal compression screw fixation of a complex comminuted intertrochanteric fracture. No complicating features associated with the hardware are identified. Remote healed right-sided pubic rami fractures. No new obvious intrapelvic abnormalities are identified There is a bilobed subcutaneous hematoma underlying the upper surgical skin staples. This measures 8.8 x 4.4 cm. It measures 65 Hounsfield units and is no worrisome enhancement to suggest this is an abscess. There is a second smaller hematoma noted underlying the lower surgical skin staples in the region of the dynamic hip screws. There is also skin thickening and fairly extensive subcutaneous soft tissue swelling/edema and fluid. I do not see any findings worrisome for abscess. Small hip joint effusion.  IMPRESSION: 1. Expected postoperative changes from recent hip surgery. No complicating features associated with the hardware. 2. Subcutaneous hematomas in the right hip area without CT findings to suggest an abscess. Electronically Signed   By: Rudie Meyer  M.D.   On: 11/18/2015 16:42   Dg Abd Portable 1v  Result Date: 11/06/2015 CLINICAL DATA:  Abdominal distension constipation. Recent hip surgery. EXAM: PORTABLE ABDOMEN - 1 VIEW COMPARISON:  None. FINDINGS: Single supine view of the abdomen and pelvis. Pacer. Cardiomegaly. No gaseous distention of bowel loops. Minimal motion degradation. Moderate amount of distal stool. Incompletely imaged right proximal femoral fixation. Probable cholecystectomy. Indeterminate calcification projecting lateral to the L4-5 level. IMPRESSION: Mildly motion degraded exam. No evidence of bowel obstruction. Colonic stool burden suggests constipation. Electronically Signed   By: Jeronimo Greaves M.D.   On: 11/06/2015 11:09   Dg C-arm 1-60 Min  Result Date: 11/03/2015 CLINICAL DATA:  Operative imaging for right proximal femur fracture ORIF. EXAM: DG C-ARM 61-120 MIN; OPERATIVE RIGHT HIP WITH PELVIS COMPARISON:  11/01/2015. FINDINGS: Two screws are supported by an medullary rod reducing the intertrochanteric fracture into near anatomic alignment. The orthopedic hardware appears well seated well aligned. IMPRESSION: 1. Well-aligned right proximal femur intertrochanteric fracture following ORIF. Electronically Signed   By: Amie Portland M.D.   On: 11/03/2015 08:51   Dg Hip Operative Unilat W Or W/o Pelvis Right  Result Date: 11/03/2015 CLINICAL DATA:  Operative imaging for right proximal femur fracture ORIF. EXAM: DG C-ARM 61-120 MIN; OPERATIVE RIGHT HIP WITH PELVIS COMPARISON:  11/01/2015. FINDINGS: Two screws are supported by an medullary rod reducing the intertrochanteric fracture into near anatomic alignment. The orthopedic hardware appears well seated well aligned. IMPRESSION: 1. Well-aligned right proximal femur intertrochanteric fracture following ORIF. Electronically Signed   By: Amie Portland M.D.   On: 11/03/2015 08:51   Dg Hip Unilat W Or Wo Pelvis 2-3 Views Right  Result Date: 11/18/2015 CLINICAL DATA:   Recent hip surgery with palpable soft tissue abnormality EXAM: DG HIP (WITH OR WITHOUT PELVIS) 2-3V RIGHT COMPARISON:  11/03/2015 FINDINGS: Postoperative changes are again noted in the proximal right femur. No new fracture is seen. Mild soft tissue swelling is noted related to the recent surgery. No other focal abnormality is seen. IMPRESSION: Postoperative change. Electronically Signed   By: Alcide Clever M.D.   On: 11/18/2015 14:22   Dg Femur Min 2 Views Right  Result Date: 11/01/2015 CLINICAL DATA:  Status post fall.  Right hip pain. EXAM: RIGHT FEMUR 2 VIEWS COMPARISON:  None. FINDINGS: Minimally displaced right intertrochanteric hip fracture. No other fracture or dislocation. Right knee arthroplasty. Peripheral vascular atherosclerotic disease. IMPRESSION: 1. Minimally displaced acute right intertrochanteric hip fracture. Electronically Signed   By: Elige Ko   On: 11/01/2015 16:53   Xr Hip Unilat W Or W/o Pelvis 2-3 Views Right  Result Date: 11/23/2015 Stable fixation of right intertroch hip fracture    Assessment/Plan:   S/P Repair of Closed displaced Fracture of right Femur.With Subcutaneous hematoma. Hematoma appearsTo slowly resolving.  She is followed closely by orthopedics-she will need PT and OT when she goes home feels she has benefited from staying. a few extra days . On low dose Norco for pain. Continue therapy.    Chronic CHF  This appears to be stable on Lasix 40 mg a day-her weight is stable at 142 edema is minimal.  Atrial Fibrillation With RVR Eliquis  restarted on 11/08--hemoglobin and she shows improvement on lab done 11-27-15-she is on  Toprol for rate control as well as amiodarone  Essential Hypertension Continue Toprol-blood pressures appear somewhat variable but I do not see consistent systolic elevations  Type 2 Diabetes Continue Accu chaecks--blood sugars appear to be mi lower mid 100s in the morning later in the day somewhat higher in  the higher 100s low 200s range at this point will monitor since she is about to go home but will need follow-up by primary care provider-at this point would like to avoid any chance of hypoglycemia since she is about to go home  She is on Amaryl 0.5 mg a day.   Anemia HGB 10.1 shows improvement. Continue iron supplement.  UTI  Positive for Klebsiella most recently she's been treated with antibiotics-she does have a history of frequent UTIs but updated urine culture looks fairly benign  History of hyponatremia this appears to be relatively stable at 132 on lab done yesterday will need follow-up by primary care provider.  Constipation this apparently has been stable now that she is on Amatine use-she is also on MiraLAX.  Previous history wheezing-this appears to be improved she does have duo nebs as needed daily.  History of dizziness this apparently has not been an issue during her stay here she does have Antivert when necessary   Patient is being discharged with the following home health services: She will need PT and OT again she is delaying her discharge till next week       Patient has been advised to f/u with their PCP in 1-2 weeks to bring them up to date on their rehab stay.  Social services at facility was responsible for arranging this appointment.  Pt will be provided with a 30 day supply of prescriptions for medications and refills must be obtained from their PCP.  For controlled substances, a more limited supply may be provided adequate until PCP appointment only.  [id number]-of note greater than 30 minutes spent on this discharge summary-greater than 50% of time spent coordinating plan of care for numerous diagnoses:                 Review of Systems  Physical Exam   There is no height or weight on file to calculate BMI. Recent Labs

## 2015-12-10 ENCOUNTER — Ambulatory Visit (INDEPENDENT_AMBULATORY_CARE_PROVIDER_SITE_OTHER): Payer: PPO | Admitting: *Deleted

## 2015-12-10 ENCOUNTER — Encounter (HOSPITAL_COMMUNITY)
Admission: RE | Admit: 2015-12-10 | Discharge: 2015-12-10 | Disposition: A | Discharge status: Home / Self Care | Payer: PPO | Source: Skilled Nursing Facility | Attending: *Deleted | Admitting: *Deleted

## 2015-12-10 DIAGNOSIS — Z950 Presence of cardiac pacemaker: Secondary | ICD-10-CM

## 2015-12-10 DIAGNOSIS — I5042 Chronic combined systolic (congestive) and diastolic (congestive) heart failure: Secondary | ICD-10-CM | POA: Diagnosis not present

## 2015-12-10 DIAGNOSIS — I2510 Atherosclerotic heart disease of native coronary artery without angina pectoris: Secondary | ICD-10-CM | POA: Diagnosis not present

## 2015-12-10 DIAGNOSIS — N390 Urinary tract infection, site not specified: Secondary | ICD-10-CM | POA: Diagnosis not present

## 2015-12-10 DIAGNOSIS — E785 Hyperlipidemia, unspecified: Secondary | ICD-10-CM | POA: Diagnosis not present

## 2015-12-10 DIAGNOSIS — I255 Ischemic cardiomyopathy: Secondary | ICD-10-CM | POA: Diagnosis not present

## 2015-12-10 DIAGNOSIS — D62 Acute posthemorrhagic anemia: Secondary | ICD-10-CM | POA: Diagnosis not present

## 2015-12-10 DIAGNOSIS — N183 Chronic kidney disease, stage 3 (moderate): Secondary | ICD-10-CM | POA: Diagnosis not present

## 2015-12-10 DIAGNOSIS — S72001D Fracture of unspecified part of neck of right femur, subsequent encounter for closed fracture with routine healing: Secondary | ICD-10-CM | POA: Diagnosis not present

## 2015-12-10 DIAGNOSIS — E1151 Type 2 diabetes mellitus with diabetic peripheral angiopathy without gangrene: Secondary | ICD-10-CM | POA: Diagnosis not present

## 2015-12-10 DIAGNOSIS — E1122 Type 2 diabetes mellitus with diabetic chronic kidney disease: Secondary | ICD-10-CM | POA: Diagnosis not present

## 2015-12-10 DIAGNOSIS — I420 Dilated cardiomyopathy: Secondary | ICD-10-CM

## 2015-12-10 DIAGNOSIS — I129 Hypertensive chronic kidney disease with stage 1 through stage 4 chronic kidney disease, or unspecified chronic kidney disease: Secondary | ICD-10-CM | POA: Diagnosis not present

## 2015-12-10 DIAGNOSIS — B961 Klebsiella pneumoniae [K. pneumoniae] as the cause of diseases classified elsewhere: Secondary | ICD-10-CM | POA: Diagnosis not present

## 2015-12-10 NOTE — Progress Notes (Signed)
Thank you :)

## 2015-12-10 NOTE — Progress Notes (Signed)
EPIC Encounter for ICM Monitoring  Patient Name: Kathleen [name] is a 82 y.o. female Date: 12/10/2015 Primary Care Physican: [name]., MD Primary Cardiologist: Croitoru Electrophysiologist: Croitoru Dry Weight:    140 lb  Bi-V Pacing:  100%               Heart Failure questions reviewed, pt asymptomatic   Thoracic impedance normal   Recommendations: No changes.  Reinforced low salt food choices and limiting fluid intake to < 2 liters per day. Encouraged to call for fluid symptoms.    Follow-up plan: ICM clinic phone appointment on 01/10/2016.  Copy of ICM check sent to cardiologist and device physician.   ICM trend: 12/10/2015       Karie Soda, RN 12/10/2015 4:55 PM

## 2015-12-11 DIAGNOSIS — S72002D Fracture of unspecified part of neck of left femur, subsequent encounter for closed fracture with routine healing: Secondary | ICD-10-CM | POA: Diagnosis not present

## 2015-12-11 DIAGNOSIS — J449 Chronic obstructive pulmonary disease, unspecified: Secondary | ICD-10-CM | POA: Diagnosis not present

## 2015-12-11 DIAGNOSIS — I200 Unstable angina: Secondary | ICD-10-CM | POA: Diagnosis not present

## 2015-12-11 DIAGNOSIS — E1129 Type 2 diabetes mellitus with other diabetic kidney complication: Secondary | ICD-10-CM | POA: Diagnosis not present

## 2015-12-11 DIAGNOSIS — Z6821 Body mass index (BMI) 21.0-21.9, adult: Secondary | ICD-10-CM | POA: Diagnosis not present

## 2015-12-11 DIAGNOSIS — I4891 Unspecified atrial fibrillation: Secondary | ICD-10-CM | POA: Diagnosis not present

## 2015-12-11 DIAGNOSIS — I517 Cardiomegaly: Secondary | ICD-10-CM | POA: Diagnosis not present

## 2015-12-11 DIAGNOSIS — Z1389 Encounter for screening for other disorder: Secondary | ICD-10-CM | POA: Diagnosis not present

## 2015-12-11 DIAGNOSIS — I509 Heart failure, unspecified: Secondary | ICD-10-CM | POA: Diagnosis not present

## 2015-12-12 DIAGNOSIS — E1122 Type 2 diabetes mellitus with diabetic chronic kidney disease: Secondary | ICD-10-CM | POA: Diagnosis not present

## 2015-12-12 DIAGNOSIS — S72001D Fracture of unspecified part of neck of right femur, subsequent encounter for closed fracture with routine healing: Secondary | ICD-10-CM | POA: Diagnosis not present

## 2015-12-12 DIAGNOSIS — B961 Klebsiella pneumoniae [K. pneumoniae] as the cause of diseases classified elsewhere: Secondary | ICD-10-CM | POA: Diagnosis not present

## 2015-12-12 DIAGNOSIS — I255 Ischemic cardiomyopathy: Secondary | ICD-10-CM | POA: Diagnosis not present

## 2015-12-12 DIAGNOSIS — E1129 Type 2 diabetes mellitus with other diabetic kidney complication: Secondary | ICD-10-CM | POA: Diagnosis not present

## 2015-12-12 DIAGNOSIS — Z1389 Encounter for screening for other disorder: Secondary | ICD-10-CM | POA: Diagnosis not present

## 2015-12-12 DIAGNOSIS — E785 Hyperlipidemia, unspecified: Secondary | ICD-10-CM | POA: Diagnosis not present

## 2015-12-12 DIAGNOSIS — D62 Acute posthemorrhagic anemia: Secondary | ICD-10-CM | POA: Diagnosis not present

## 2015-12-12 DIAGNOSIS — I517 Cardiomegaly: Secondary | ICD-10-CM | POA: Diagnosis not present

## 2015-12-12 DIAGNOSIS — E1151 Type 2 diabetes mellitus with diabetic peripheral angiopathy without gangrene: Secondary | ICD-10-CM | POA: Diagnosis not present

## 2015-12-12 DIAGNOSIS — N183 Chronic kidney disease, stage 3 (moderate): Secondary | ICD-10-CM | POA: Diagnosis not present

## 2015-12-12 DIAGNOSIS — N390 Urinary tract infection, site not specified: Secondary | ICD-10-CM | POA: Diagnosis not present

## 2015-12-12 DIAGNOSIS — I2510 Atherosclerotic heart disease of native coronary artery without angina pectoris: Secondary | ICD-10-CM | POA: Diagnosis not present

## 2015-12-12 DIAGNOSIS — I129 Hypertensive chronic kidney disease with stage 1 through stage 4 chronic kidney disease, or unspecified chronic kidney disease: Secondary | ICD-10-CM | POA: Diagnosis not present

## 2015-12-12 NOTE — Progress Notes (Signed)
Remote pacemaker transmission.   

## 2015-12-13 ENCOUNTER — Encounter: Payer: Self-pay | Admitting: Cardiology

## 2015-12-14 DIAGNOSIS — N390 Urinary tract infection, site not specified: Secondary | ICD-10-CM | POA: Diagnosis not present

## 2015-12-14 DIAGNOSIS — S72001D Fracture of unspecified part of neck of right femur, subsequent encounter for closed fracture with routine healing: Secondary | ICD-10-CM | POA: Diagnosis not present

## 2015-12-14 DIAGNOSIS — E1151 Type 2 diabetes mellitus with diabetic peripheral angiopathy without gangrene: Secondary | ICD-10-CM | POA: Diagnosis not present

## 2015-12-14 DIAGNOSIS — N183 Chronic kidney disease, stage 3 (moderate): Secondary | ICD-10-CM | POA: Diagnosis not present

## 2015-12-14 DIAGNOSIS — E785 Hyperlipidemia, unspecified: Secondary | ICD-10-CM | POA: Diagnosis not present

## 2015-12-14 DIAGNOSIS — I2510 Atherosclerotic heart disease of native coronary artery without angina pectoris: Secondary | ICD-10-CM | POA: Diagnosis not present

## 2015-12-14 DIAGNOSIS — B961 Klebsiella pneumoniae [K. pneumoniae] as the cause of diseases classified elsewhere: Secondary | ICD-10-CM | POA: Diagnosis not present

## 2015-12-14 DIAGNOSIS — E1122 Type 2 diabetes mellitus with diabetic chronic kidney disease: Secondary | ICD-10-CM | POA: Diagnosis not present

## 2015-12-14 DIAGNOSIS — D62 Acute posthemorrhagic anemia: Secondary | ICD-10-CM | POA: Diagnosis not present

## 2015-12-14 DIAGNOSIS — I129 Hypertensive chronic kidney disease with stage 1 through stage 4 chronic kidney disease, or unspecified chronic kidney disease: Secondary | ICD-10-CM | POA: Diagnosis not present

## 2015-12-14 DIAGNOSIS — I255 Ischemic cardiomyopathy: Secondary | ICD-10-CM | POA: Diagnosis not present

## 2015-12-20 DIAGNOSIS — M25551 Pain in right hip: Secondary | ICD-10-CM

## 2015-12-21 ENCOUNTER — Encounter (INDEPENDENT_AMBULATORY_CARE_PROVIDER_SITE_OTHER): Payer: Self-pay | Admitting: Physician Assistant

## 2015-12-21 ENCOUNTER — Ambulatory Visit (INDEPENDENT_AMBULATORY_CARE_PROVIDER_SITE_OTHER): Payer: PPO | Admitting: Orthopaedic Surgery

## 2015-12-21 ENCOUNTER — Ambulatory Visit (INDEPENDENT_AMBULATORY_CARE_PROVIDER_SITE_OTHER): Payer: Self-pay

## 2015-12-21 DIAGNOSIS — E1151 Type 2 diabetes mellitus with diabetic peripheral angiopathy without gangrene: Secondary | ICD-10-CM | POA: Diagnosis not present

## 2015-12-21 DIAGNOSIS — I2510 Atherosclerotic heart disease of native coronary artery without angina pectoris: Secondary | ICD-10-CM | POA: Diagnosis not present

## 2015-12-21 DIAGNOSIS — B961 Klebsiella pneumoniae [K. pneumoniae] as the cause of diseases classified elsewhere: Secondary | ICD-10-CM | POA: Diagnosis not present

## 2015-12-21 DIAGNOSIS — S72141D Displaced intertrochanteric fracture of right femur, subsequent encounter for closed fracture with routine healing: Secondary | ICD-10-CM

## 2015-12-21 DIAGNOSIS — E1122 Type 2 diabetes mellitus with diabetic chronic kidney disease: Secondary | ICD-10-CM | POA: Diagnosis not present

## 2015-12-21 DIAGNOSIS — N390 Urinary tract infection, site not specified: Secondary | ICD-10-CM | POA: Diagnosis not present

## 2015-12-21 DIAGNOSIS — S72001D Fracture of unspecified part of neck of right femur, subsequent encounter for closed fracture with routine healing: Secondary | ICD-10-CM | POA: Diagnosis not present

## 2015-12-21 DIAGNOSIS — D62 Acute posthemorrhagic anemia: Secondary | ICD-10-CM | POA: Diagnosis not present

## 2015-12-21 DIAGNOSIS — N183 Chronic kidney disease, stage 3 (moderate): Secondary | ICD-10-CM | POA: Diagnosis not present

## 2015-12-21 DIAGNOSIS — I129 Hypertensive chronic kidney disease with stage 1 through stage 4 chronic kidney disease, or unspecified chronic kidney disease: Secondary | ICD-10-CM | POA: Diagnosis not present

## 2015-12-21 DIAGNOSIS — I255 Ischemic cardiomyopathy: Secondary | ICD-10-CM | POA: Diagnosis not present

## 2015-12-21 DIAGNOSIS — E785 Hyperlipidemia, unspecified: Secondary | ICD-10-CM | POA: Diagnosis not present

## 2015-12-21 NOTE — Progress Notes (Signed)
Patient is 6 weeks status post IM nailing of right inner trochanter fracture. She is doing well. She is walking with a walker. Physical exam is stable. The scar is well-healed. No pain with range of motion of the hip. Walking well with a walker. 2 view x-rays of the right hip shows stable fixation and atrophic fracture Plan continue with physical therapy and patient is progressing well see her back in 6 weeks for 2-view x-rays of the right hip.

## 2015-12-24 DIAGNOSIS — E785 Hyperlipidemia, unspecified: Secondary | ICD-10-CM | POA: Diagnosis not present

## 2015-12-24 DIAGNOSIS — N183 Chronic kidney disease, stage 3 (moderate): Secondary | ICD-10-CM | POA: Diagnosis not present

## 2015-12-24 DIAGNOSIS — I2510 Atherosclerotic heart disease of native coronary artery without angina pectoris: Secondary | ICD-10-CM | POA: Diagnosis not present

## 2015-12-24 DIAGNOSIS — E1151 Type 2 diabetes mellitus with diabetic peripheral angiopathy without gangrene: Secondary | ICD-10-CM | POA: Diagnosis not present

## 2015-12-24 DIAGNOSIS — N390 Urinary tract infection, site not specified: Secondary | ICD-10-CM | POA: Diagnosis not present

## 2015-12-24 DIAGNOSIS — I129 Hypertensive chronic kidney disease with stage 1 through stage 4 chronic kidney disease, or unspecified chronic kidney disease: Secondary | ICD-10-CM | POA: Diagnosis not present

## 2015-12-24 DIAGNOSIS — I255 Ischemic cardiomyopathy: Secondary | ICD-10-CM | POA: Diagnosis not present

## 2015-12-24 DIAGNOSIS — B961 Klebsiella pneumoniae [K. pneumoniae] as the cause of diseases classified elsewhere: Secondary | ICD-10-CM | POA: Diagnosis not present

## 2015-12-24 DIAGNOSIS — E1122 Type 2 diabetes mellitus with diabetic chronic kidney disease: Secondary | ICD-10-CM | POA: Diagnosis not present

## 2015-12-24 DIAGNOSIS — D62 Acute posthemorrhagic anemia: Secondary | ICD-10-CM | POA: Diagnosis not present

## 2015-12-24 DIAGNOSIS — S72001D Fracture of unspecified part of neck of right femur, subsequent encounter for closed fracture with routine healing: Secondary | ICD-10-CM | POA: Diagnosis not present

## 2015-12-26 DIAGNOSIS — N183 Chronic kidney disease, stage 3 (moderate): Secondary | ICD-10-CM | POA: Diagnosis not present

## 2015-12-26 DIAGNOSIS — I129 Hypertensive chronic kidney disease with stage 1 through stage 4 chronic kidney disease, or unspecified chronic kidney disease: Secondary | ICD-10-CM | POA: Diagnosis not present

## 2015-12-26 DIAGNOSIS — S72001D Fracture of unspecified part of neck of right femur, subsequent encounter for closed fracture with routine healing: Secondary | ICD-10-CM | POA: Diagnosis not present

## 2015-12-26 DIAGNOSIS — E1122 Type 2 diabetes mellitus with diabetic chronic kidney disease: Secondary | ICD-10-CM | POA: Diagnosis not present

## 2015-12-26 DIAGNOSIS — N390 Urinary tract infection, site not specified: Secondary | ICD-10-CM | POA: Diagnosis not present

## 2015-12-26 DIAGNOSIS — D62 Acute posthemorrhagic anemia: Secondary | ICD-10-CM | POA: Diagnosis not present

## 2015-12-26 DIAGNOSIS — B961 Klebsiella pneumoniae [K. pneumoniae] as the cause of diseases classified elsewhere: Secondary | ICD-10-CM | POA: Diagnosis not present

## 2015-12-26 DIAGNOSIS — E1151 Type 2 diabetes mellitus with diabetic peripheral angiopathy without gangrene: Secondary | ICD-10-CM | POA: Diagnosis not present

## 2015-12-26 DIAGNOSIS — I2510 Atherosclerotic heart disease of native coronary artery without angina pectoris: Secondary | ICD-10-CM | POA: Diagnosis not present

## 2015-12-26 DIAGNOSIS — I255 Ischemic cardiomyopathy: Secondary | ICD-10-CM | POA: Diagnosis not present

## 2015-12-26 DIAGNOSIS — E785 Hyperlipidemia, unspecified: Secondary | ICD-10-CM | POA: Diagnosis not present

## 2015-12-27 DIAGNOSIS — N183 Chronic kidney disease, stage 3 (moderate): Secondary | ICD-10-CM | POA: Diagnosis not present

## 2015-12-27 DIAGNOSIS — B961 Klebsiella pneumoniae [K. pneumoniae] as the cause of diseases classified elsewhere: Secondary | ICD-10-CM | POA: Diagnosis not present

## 2015-12-27 DIAGNOSIS — D62 Acute posthemorrhagic anemia: Secondary | ICD-10-CM | POA: Diagnosis not present

## 2015-12-27 DIAGNOSIS — I255 Ischemic cardiomyopathy: Secondary | ICD-10-CM | POA: Diagnosis not present

## 2015-12-27 DIAGNOSIS — E1122 Type 2 diabetes mellitus with diabetic chronic kidney disease: Secondary | ICD-10-CM | POA: Diagnosis not present

## 2015-12-27 DIAGNOSIS — I2510 Atherosclerotic heart disease of native coronary artery without angina pectoris: Secondary | ICD-10-CM | POA: Diagnosis not present

## 2015-12-27 DIAGNOSIS — E1151 Type 2 diabetes mellitus with diabetic peripheral angiopathy without gangrene: Secondary | ICD-10-CM | POA: Diagnosis not present

## 2015-12-27 DIAGNOSIS — E785 Hyperlipidemia, unspecified: Secondary | ICD-10-CM | POA: Diagnosis not present

## 2015-12-27 DIAGNOSIS — N390 Urinary tract infection, site not specified: Secondary | ICD-10-CM | POA: Diagnosis not present

## 2015-12-27 DIAGNOSIS — S72001D Fracture of unspecified part of neck of right femur, subsequent encounter for closed fracture with routine healing: Secondary | ICD-10-CM | POA: Diagnosis not present

## 2015-12-27 DIAGNOSIS — I129 Hypertensive chronic kidney disease with stage 1 through stage 4 chronic kidney disease, or unspecified chronic kidney disease: Secondary | ICD-10-CM | POA: Diagnosis not present

## 2015-12-28 ENCOUNTER — Encounter: Payer: Self-pay | Admitting: Cardiology

## 2015-12-28 DIAGNOSIS — E1122 Type 2 diabetes mellitus with diabetic chronic kidney disease: Secondary | ICD-10-CM | POA: Diagnosis not present

## 2015-12-28 DIAGNOSIS — I255 Ischemic cardiomyopathy: Secondary | ICD-10-CM | POA: Diagnosis not present

## 2015-12-28 DIAGNOSIS — E1151 Type 2 diabetes mellitus with diabetic peripheral angiopathy without gangrene: Secondary | ICD-10-CM | POA: Diagnosis not present

## 2015-12-28 DIAGNOSIS — N390 Urinary tract infection, site not specified: Secondary | ICD-10-CM | POA: Diagnosis not present

## 2015-12-28 DIAGNOSIS — N183 Chronic kidney disease, stage 3 (moderate): Secondary | ICD-10-CM | POA: Diagnosis not present

## 2015-12-28 DIAGNOSIS — B961 Klebsiella pneumoniae [K. pneumoniae] as the cause of diseases classified elsewhere: Secondary | ICD-10-CM | POA: Diagnosis not present

## 2015-12-28 DIAGNOSIS — E785 Hyperlipidemia, unspecified: Secondary | ICD-10-CM | POA: Diagnosis not present

## 2015-12-28 DIAGNOSIS — S72001D Fracture of unspecified part of neck of right femur, subsequent encounter for closed fracture with routine healing: Secondary | ICD-10-CM | POA: Diagnosis not present

## 2015-12-28 DIAGNOSIS — D62 Acute posthemorrhagic anemia: Secondary | ICD-10-CM | POA: Diagnosis not present

## 2015-12-28 DIAGNOSIS — I129 Hypertensive chronic kidney disease with stage 1 through stage 4 chronic kidney disease, or unspecified chronic kidney disease: Secondary | ICD-10-CM | POA: Diagnosis not present

## 2015-12-28 DIAGNOSIS — I2510 Atherosclerotic heart disease of native coronary artery without angina pectoris: Secondary | ICD-10-CM | POA: Diagnosis not present

## 2015-12-28 LAB — CUP PACEART REMOTE DEVICE CHECK
Battery Remaining Longevity: 43 mo
Battery Voltage: 2.99 V
Brady Statistic AP VP Percent: 86.39 %
Brady Statistic RA Percent Paced: 86.4 %
Brady Statistic RV Percent Paced: 99.97 %
Date Time Interrogation Session: 20171127230503
Implantable Lead Implant Date: 20130311
Implantable Lead Implant Date: 20130311
Implantable Lead Location: 753858
Implantable Lead Model: 4194
Lead Channel Impedance Value: 266 Ohm
Lead Channel Impedance Value: 342 Ohm
Lead Channel Impedance Value: 418 Ohm
Lead Channel Impedance Value: 608 Ohm
Lead Channel Pacing Threshold Amplitude: 0.625 V
Lead Channel Pacing Threshold Amplitude: 1.375 V
Lead Channel Pacing Threshold Pulse Width: 0.4 ms
Lead Channel Pacing Threshold Pulse Width: 0.4 ms
Lead Channel Sensing Intrinsic Amplitude: 2.25 mV
Lead Channel Setting Pacing Amplitude: 2 V
Lead Channel Setting Pacing Pulse Width: 0.4 ms
Lead Channel Setting Sensing Sensitivity: 4 mV
MDC IDC LEAD IMPLANT DT: 20131003
MDC IDC LEAD LOCATION: 753859
MDC IDC LEAD LOCATION: 753860
MDC IDC LEAD MODEL: 5086
MDC IDC LEAD MODEL: 5086
MDC IDC MSMT LEADCHNL LV IMPEDANCE VALUE: 418 Ohm
MDC IDC MSMT LEADCHNL LV IMPEDANCE VALUE: 456 Ohm
MDC IDC MSMT LEADCHNL LV IMPEDANCE VALUE: 551 Ohm
MDC IDC MSMT LEADCHNL LV PACING THRESHOLD AMPLITUDE: 1.125 V
MDC IDC MSMT LEADCHNL LV PACING THRESHOLD PULSEWIDTH: 0.4 ms
MDC IDC MSMT LEADCHNL RA SENSING INTR AMPL: 2.25 mV
MDC IDC MSMT LEADCHNL RV IMPEDANCE VALUE: 361 Ohm
MDC IDC MSMT LEADCHNL RV IMPEDANCE VALUE: 475 Ohm
MDC IDC MSMT LEADCHNL RV SENSING INTR AMPL: 22.625 mV
MDC IDC MSMT LEADCHNL RV SENSING INTR AMPL: 22.625 mV
MDC IDC PG IMPLANT DT: 20131003
MDC IDC SET LEADCHNL LV PACING AMPLITUDE: 2.25 V
MDC IDC SET LEADCHNL LV PACING PULSEWIDTH: 0.4 ms
MDC IDC SET LEADCHNL RV PACING AMPLITUDE: 2.75 V
MDC IDC STAT BRADY AP VS PERCENT: 0.01 %
MDC IDC STAT BRADY AS VP PERCENT: 13.59 %
MDC IDC STAT BRADY AS VS PERCENT: 0.01 %

## 2016-01-10 ENCOUNTER — Ambulatory Visit (INDEPENDENT_AMBULATORY_CARE_PROVIDER_SITE_OTHER): Payer: PPO

## 2016-01-10 ENCOUNTER — Telehealth: Payer: Self-pay | Admitting: Cardiology

## 2016-01-10 DIAGNOSIS — Z950 Presence of cardiac pacemaker: Secondary | ICD-10-CM | POA: Diagnosis not present

## 2016-01-10 DIAGNOSIS — I5042 Chronic combined systolic (congestive) and diastolic (congestive) heart failure: Secondary | ICD-10-CM | POA: Diagnosis not present

## 2016-01-10 NOTE — Telephone Encounter (Signed)
Spoke with pt and reminded pt of remote transmission that is due today. Pt verbalized understanding.   

## 2016-01-11 ENCOUNTER — Telehealth: Payer: Self-pay

## 2016-01-11 NOTE — Telephone Encounter (Signed)
Attempted call to daughter, Lourdes Sledge at [phone] and left message for return call.  Also attempted call to patient at home number and no answer.

## 2016-01-11 NOTE — Progress Notes (Signed)
EPIC Encounter for ICM Monitoring  Patient Name: Kathleen [name] is a 82 y.o. female Date: 01/11/2016 Primary Care Physican: Glo Herring., MD Primary Cardiologist:Croitoru Electrophysiologist: Croitoru Dry Weight:unknown Bi-V Pacing: 100%      Attempted ICM call to daughter, [name] and patient and unable to reach.  Left message to return call regarding transmission.  Transmission reviewed.   Thoracic impedance slightly abnormal suggesting fluid accumulation from 11/26 to 12/20 and 12/24 until today, 12/29.  Labs: 11/27/2015 Creatinine 1.01, BUN 21, Potassium 3.7, Sodium 132, EGFR 48-56 11/19/2015 Creatinine 0.94, BUN 17, Potassium 3.6, Sodium 133, EGFR 53->60  11/18/2015 Creatinine 1.13, BUN 22, Potassium 4.7, Sodium 132, EGFR 42-49  11/14/2015 Creatinine 1.09, BUN 22, Potassium 3.8, Sodium 132, EGFR 44-51  11/12/2015 Creatinine 1.33, BUN 24, Potassium 3.9, Sodium 129, EGFR 35-40  Creatinine has ranged from 1.12 - 1.61 from 04/30/2015 to 11/07/2015  Recommendations:  NONE at this time, unable to reach.    Follow-up plan: ICM clinic phone appointment on 01/18/2016 to recheck fluid levels.  Copy of ICM check sent to cardiologist/device physician.   3 month ICM trend : 01/11/2016   1 Year ICM trend:      [name], RN 01/11/2016 10:59 AM

## 2016-01-11 NOTE — Progress Notes (Signed)
Thanks, agree she needs a recheck.

## 2016-01-15 ENCOUNTER — Other Ambulatory Visit: Payer: Self-pay | Admitting: Cardiovascular Disease

## 2016-01-15 ENCOUNTER — Other Ambulatory Visit (INDEPENDENT_AMBULATORY_CARE_PROVIDER_SITE_OTHER): Payer: Self-pay | Admitting: Internal Medicine

## 2016-01-15 NOTE — Telephone Encounter (Signed)
Rx request sent to pharmacy.  

## 2016-01-18 ENCOUNTER — Ambulatory Visit (INDEPENDENT_AMBULATORY_CARE_PROVIDER_SITE_OTHER): Payer: PPO

## 2016-01-18 ENCOUNTER — Telehealth: Payer: Self-pay | Admitting: Cardiology

## 2016-01-18 DIAGNOSIS — Z950 Presence of cardiac pacemaker: Secondary | ICD-10-CM

## 2016-01-18 DIAGNOSIS — I5042 Chronic combined systolic (congestive) and diastolic (congestive) heart failure: Secondary | ICD-10-CM

## 2016-01-18 NOTE — Telephone Encounter (Signed)
Confirmed remote transmission w/ pt daugher.

## 2016-01-18 NOTE — Progress Notes (Signed)
EPIC Encounter for ICM Monitoring  Patient Name: Kathleen [name] is a 83 y.o. female Date: 01/18/2016 Primary Care Physican: Glo Herring., MD Primary Cardiologist:Croitoru Electrophysiologist: Croitoru Dry Weight:unknown Bi-V Pacing: 100%      Recheck of fluid levels.  Attempted call to [name], daughter.  Left message for return call.  Thoracic impedance remains abnormal suggesting fluid accumulation after recheck from 01/10/2016.  Labs: 11/27/2015 Creatinine 1.01, BUN 21, Potassium 3.7, Sodium 132, EGFR 48-56 11/19/2015 Creatinine 0.94, BUN 17, Potassium 3.6, Sodium 133, EGFR 53->60  11/18/2015 Creatinine 1.13, BUN 22, Potassium 4.7, Sodium 132, EGFR 42-49  11/14/2015 Creatinine 1.09, BUN 22, Potassium 3.8, Sodium 132, EGFR 44-51  11/12/2015 Creatinine 1.33, BUN 24, Potassium 3.9, Sodium 129, EGFR 35-40  Creatinine has ranged from 1.12 - 1.61 from 04/30/2015 to 11/07/2015  Recommendations:  Provided ICM number and requested call back.   Follow-up plan: ICM clinic phone appointment on 02/04/2016 recheck in 2 weeks.  Copy of ICM check sent to cardiologist and device physician.   3 month ICM trend : 01/18/2016   1 Year ICM trend:      [name], RN 01/18/2016 5:24 PM

## 2016-01-21 NOTE — Progress Notes (Signed)
Returned call to daughter and left message.

## 2016-01-25 NOTE — Progress Notes (Signed)
Daughter returned call.  She reported there was problem getting Furosemide refilled and she off the med for about a week.  Patient's pharmacist told daughter it was okay for patient to take 1/2 tablet of Metolazone 2.5mg  that was ordered last year.  Explained Metolazone is no longer on current med list.  Patient has started back on Furosemide 40 mg daily. She had a slight increase in SOB.   Changed next ICM remote transmission to 01/29/2016 to recheck fluid levels.

## 2016-01-25 NOTE — Progress Notes (Signed)
Thank you :)

## 2016-01-29 ENCOUNTER — Ambulatory Visit (INDEPENDENT_AMBULATORY_CARE_PROVIDER_SITE_OTHER): Payer: PPO

## 2016-01-29 ENCOUNTER — Telehealth: Payer: Self-pay | Admitting: Cardiology

## 2016-01-29 DIAGNOSIS — I5042 Chronic combined systolic (congestive) and diastolic (congestive) heart failure: Secondary | ICD-10-CM

## 2016-01-29 DIAGNOSIS — Z950 Presence of cardiac pacemaker: Secondary | ICD-10-CM

## 2016-01-29 NOTE — Telephone Encounter (Signed)
Confirmed remote transmission w/ pt daughter.   

## 2016-01-29 NOTE — Progress Notes (Signed)
EPIC Encounter for ICM Monitoring  Patient Name: Kathleen [name] is a 83 y.o. female Date: 01/29/2016 Primary Care Physican: Glo Herring., MD Primary Cardiologist:Croitoru Electrophysiologist: Croitoru Dry Weight:unknown Bi-V Pacing: 100%         Attempted ICM call to daughter, [name] and unable to reach.  Left message to return call regarding transmission.  Transmission reviewed.   Thoracic impedance abnormal suggesting fluid accumulation.  Labs: 11/27/2015 Creatinine 1.01, BUN 21, Potassium 3.7, Sodium 132, EGFR 48-56 11/19/2015 Creatinine 0.94, BUN 17, Potassium 3.6, Sodium 133, EGFR 53->60  11/18/2015 Creatinine 1.13, BUN 22, Potassium 4.7, Sodium 132, EGFR 42-49  11/14/2015 Creatinine 1.09, BUN 22, Potassium 3.8, Sodium 132, EGFR 44-51  11/12/2015 Creatinine 1.33, BUN 24, Potassium 3.9, Sodium 129, EGFR 35-40  Creatinine has ranged from 1.12 - 1.61 from 04/30/2015 to 11/07/2015  Recommendations:  Unable to reach  Follow-up plan: ICM clinic phone appointment on 02/11/2016.  Copy of ICM check sent to cardiologist/ device physician.   3 month ICM trend: 01/29/2016   1 Year ICM trend:      [name], RN 01/29/2016 4:26 PM

## 2016-02-01 ENCOUNTER — Ambulatory Visit (INDEPENDENT_AMBULATORY_CARE_PROVIDER_SITE_OTHER): Payer: PPO | Admitting: Orthopaedic Surgery

## 2016-02-01 ENCOUNTER — Encounter (INDEPENDENT_AMBULATORY_CARE_PROVIDER_SITE_OTHER): Payer: Self-pay

## 2016-02-11 ENCOUNTER — Ambulatory Visit (INDEPENDENT_AMBULATORY_CARE_PROVIDER_SITE_OTHER): Payer: PPO

## 2016-02-11 ENCOUNTER — Telehealth: Payer: Self-pay

## 2016-02-11 DIAGNOSIS — I5042 Chronic combined systolic (congestive) and diastolic (congestive) heart failure: Secondary | ICD-10-CM

## 2016-02-11 DIAGNOSIS — Z950 Presence of cardiac pacemaker: Secondary | ICD-10-CM

## 2016-02-11 NOTE — Progress Notes (Signed)
EPIC Encounter for ICM Monitoring  Patient Name: Kathleen [name] is a 83 y.o. female Date: 02/11/2016 Primary Care Physican: Glo Herring., MD Primary Cardiologist:Croitoru Electrophysiologist: Croitoru Dry Weight:unknown Bi-V Pacing: 100%      Spoke with daughter, [name].  Heart Failure questions reviewed, pt asymptomatic.  She stated patient is taking Furosemide 40 mg weekly instead of prescribed dose of daily.  Patient is worried about getting dehydrated.   Thoracic impedance abnormal suggesting fluid accumulation since has progressively worsening since 01/06/2017 which may be related to Lasix not taken daily as prescribed.   Labs: 11/27/2015 Creatinine 1.01, BUN 21, Potassium 3.7, Sodium 132, EGFR 48-56 11/19/2015 Creatinine 0.94, BUN 17, Potassium 3.6, Sodium 133, EGFR 53->60  11/18/2015 Creatinine 1.13, BUN 22, Potassium 4.7, Sodium 132, EGFR 42-49  11/14/2015 Creatinine 1.09, BUN 22, Potassium 3.8, Sodium 132, EGFR 44-51  11/12/2015 Creatinine 1.33, BUN 24, Potassium 3.9, Sodium 129, EGFR 35-40  Creatinine has ranged from 1.12 - 1.61 from 04/30/2015 to 11/07/2015  Recommendations:  Advised patient should resume taking Furosemide 40 mg daily as prescribed.  She stated she will talk with patient to see if she will agree to take the medication as prescribed or at least take 1/2 tablet.   Follow-up plan: ICM clinic phone appointment on 02/15/2016 to recheck fluid levels.  Copy of ICM check sent to cardiologist/device physician.   3 month ICM trend: 02/11/2016   1 Year ICM trend:      [name], RN 02/11/2016 10:41 AM

## 2016-02-11 NOTE — Telephone Encounter (Signed)
Spoke with daughter and requested she have patient send ICM remote transmission to recheck fluid levels.  She will send today.

## 2016-02-12 ENCOUNTER — Encounter (INDEPENDENT_AMBULATORY_CARE_PROVIDER_SITE_OTHER): Payer: Self-pay | Admitting: Orthopaedic Surgery

## 2016-02-12 ENCOUNTER — Ambulatory Visit (INDEPENDENT_AMBULATORY_CARE_PROVIDER_SITE_OTHER): Payer: PPO

## 2016-02-12 ENCOUNTER — Ambulatory Visit (INDEPENDENT_AMBULATORY_CARE_PROVIDER_SITE_OTHER): Payer: PPO | Admitting: Orthopaedic Surgery

## 2016-02-12 DIAGNOSIS — S72141D Displaced intertrochanteric fracture of right femur, subsequent encounter for closed fracture with routine healing: Secondary | ICD-10-CM

## 2016-02-12 NOTE — Progress Notes (Signed)
Office Visit Note   Patient: Kathleen [name]           Date of Birth: 04-22-1924           MRN: [id number] Visit Date: 02/12/2016              Requested by: Elfredia Nevins, MD [address] [place], [place] [address] PCP: [name]., MD   Assessment & Plan: Visit Diagnoses:  1. Closed displaced intertrochanteric fracture of right femur with routine healing, subsequent encounter     Plan: Reassurance was given that her fracture is healed. She mainly complains of a skilled pain from weakness. She understands this will be a long recovery process and takes up to a year. I'll see her back as needed.  Follow-Up Instructions: Return if symptoms worsen or fail to improve.   Orders:  Orders Placed This Encounter  Procedures  . XR HIP UNILAT W OR W/O PELVIS 2-3 VIEWS RIGHT   No orders of the defined types were placed in this encounter.     Procedures: No procedures performed   Clinical Data: No additional findings.   Subjective: Chief Complaint  Patient presents with  . Right Hip - Pain    Patient is status post intramedullary fixation right hip fracture on 11/03/2015. She endorses discomfort and pain throughout the thigh with weightbearing. Denies any groin pain.    Review of Systems   Objective: Vital Signs: There were no vitals taken for this visit.  Physical Exam  Ortho Exam Exam of the right hip shows painless range of motion of the hip. Knees also has painless range of motion. There is no reproducible pain with palpation of the thigh. She does have weak hip flexion. Specialty Comments:  No specialty comments available.  Imaging: Xr Hip Unilat W Or W/o Pelvis 2-3 Views Right  Result Date: 02/12/2016 Healed intertrochanteric hip fracture    PMFS History: Patient Active Problem List   Diagnosis Date Noted  . Right hip pain   . Pressure injury of skin 11/19/2015  . Hematoma of hip, right, initial encounter 11/18/2015  . Abdominal  distension   . Malnutrition of moderate degree 11/02/2015  . Closed displaced intertrochanteric fracture of right femur (HCC)   . Hip fracture (HCC) 11/01/2015  . Acute on chronic combined systolic and diastolic CHF, NYHA class 1 (HCC) 05/02/2015  . Pain in the chest   . Chest pain 04/30/2015  . Pelvis fracture, closed, initial encounter 02/19/2015  . Distal radial epiphysitis 02/19/2015  . Hypercortisolemia (HCC) 10/03/2014  . UTI (urinary tract infection) 10/05/2013  . Atrial fibrillation with RVR (HCC) 10/04/2013  . Generalized weakness 10/04/2013  . Chronic combined systolic and diastolic CHF (congestive heart failure) (HCC) 10/20/2012  . CKD (chronic kidney disease) stage 3, GFR 30-59 ml/min 10/20/2012  . Cardiomyopathy, dilated, nonischemic 10/14/2011  . CRT - pacemaker Medtronic 10/14/2011  . Hypokalemia 10/12/2011  . Acute renal insufficiency, Scr was WNL in April 2013 10/11/2011  . Anemia 10/11/2011  . Unstable angina (HCC) 10/11/2011  . Gait abnormality 05/13/2011  . Syncope 03/25/2011  . COPD on CXR 03/25/2011  . Coronary atherosclerosis, no significant obstructive lesions 03/25/2011  . Symptomatic advanced heart block, 2:1 AV block, RT BBB, Lt post. fas. block.(March 2013) 03/23/2011  . Paroxysmal atrial fibrillation (HCC) 03/23/2011  . Lung nodule 12/13/2010  . DM type 2 (diabetes mellitus, type 2) (HCC) 12/13/2010  . Hyperlipidemia 12/13/2010  . TOTAL KNEE FOLLOW-UP 06/20/2009  . MONONEURITIS, LEG 02/19/2009  .  KNEE, ARTHRITIS, DEGEN./OSTEO 11/30/2008  . SPINAL STENOSIS 07/04/2008  . HEMARTHROSIS, LOWER LEG 05/10/2008  . Effusion of lower leg joint 05/03/2008  . KNEE PAIN 05/03/2008  . Hypertension 05/03/2008   Past Medical History:  Diagnosis Date  . Acid reflux   . 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 CAD (coronary artery disease)   . Chronic kidney disease (CKD), stage III (moderate)   . Essential hypertension   . History of  pneumonia   . Hyperlipidemia   . Macular degeneration   . Nonischemic cardiomyopathy (HCC)   . PAF (paroxysmal atrial fibrillation) (HCC)   . Type 2 diabetes mellitus (HCC)     Family History  Problem Relation Age of Onset  . Suicidality Mother   . Heart murmur Daughter   . Heart attack Brother     Past Surgical History:  Procedure Laterality Date  . Bladder tack  1970's  . CARDIAC CATHETERIZATION  10/15/2011   Normal coronaries  . CARDIOVERSION N/A 10/28/2013   Procedure: CARDIOVERSION;  Surgeon: Thurmon Fair, MD;  Location: [facility] ENDOSCOPY;  Service: Cardiovascular;  Laterality: N/A;  . CHOLECYSTECTOMY  1999  . INTRAMEDULLARY (IM) NAIL INTERTROCHANTERIC Right 11/03/2015   Procedure: INTRAMEDULLARY (IM) NAIL RIGHT INTERTROCHANTERIC HIP FRACTURE;  Surgeon: Tarry Kos, MD;  Location: [facility] OR;  Service: Orthopedics;  Laterality: Right;  . LEFT AND RIGHT HEART CATHETERIZATION WITH CORONARY ANGIOGRAM N/A 10/15/2011   Procedure: LEFT AND RIGHT HEART CATHETERIZATION WITH CORONARY ANGIOGRAM;  Surgeon: Chrystie Nose, MD;  Location: [facility] CATH LAB;  Service: Cardiovascular;  Laterality: N/A;  . NM MYOCAR PERF WALL MOTION  03/15/2009   Normal  . PACEMAKER INSERTION  10/16/2011   Medtronic  . PERMANENT PACEMAKER INSERTION N/A 03/24/2011   Procedure: PERMANENT PACEMAKER INSERTION;  Surgeon: Thurmon Fair, MD;  Location: [facility] CATH LAB;  Service: Cardiovascular;  Laterality: N/A;  . REPLACEMENT TOTAL KNEE     Social History   Occupational History  . retired    Social History Main Topics  . Smoking status: Never Smoker  . Smokeless tobacco: Never Used  . Alcohol use No  . Drug use: No  . Sexual activity: Not Currently

## 2016-02-15 ENCOUNTER — Telehealth: Payer: Self-pay

## 2016-02-15 ENCOUNTER — Ambulatory Visit (INDEPENDENT_AMBULATORY_CARE_PROVIDER_SITE_OTHER): Payer: PPO

## 2016-02-15 DIAGNOSIS — Z950 Presence of cardiac pacemaker: Secondary | ICD-10-CM

## 2016-02-15 DIAGNOSIS — I5042 Chronic combined systolic (congestive) and diastolic (congestive) heart failure: Secondary | ICD-10-CM

## 2016-02-15 NOTE — Telephone Encounter (Signed)
Left voice mail message for daughter to have patient send ICM remote transmission today

## 2016-02-16 ENCOUNTER — Other Ambulatory Visit: Payer: Self-pay | Admitting: Cardiovascular Disease

## 2016-02-28 NOTE — Progress Notes (Signed)
EPIC Encounter for ICM Monitoring  Patient Name: Kathleen [name] is a 83 y.o. female Date: 02/28/2016 Primary Care Physican: Glo Herring., MD Primary Cardiologist:Croitoru Electrophysiologist: Croitoru Dry Weight:unknown Bi-V Pacing: 100%        Spoke with daughter, [name].  2/7 Remote transmission reviewed today due to unaware patient sent remote on 2/7 instead of 02/15/2016 as scheduled.  In the last 2 days patient has had a 3 lb weight gain and shortness of breath.    Thoracic impedance continues to be abnormal suggesting fluid accumulation.  Current prescribed dose of Furosemide 40 mg 1 tablet daily but does not take as prescribed for fear of becoming dehydrated.  Since 02/11/2016 she has been taking .5 tablet (20 mg total) daily but no improvement on remote transmission.   Labs: 11/27/2015 Creatinine 1.01, BUN 21, Potassium 3.7, Sodium 132, EGFR 48-56 11/19/2015 Creatinine 0.94, BUN 17, Potassium 3.6, Sodium 133, EGFR 53->60  11/18/2015 Creatinine 1.13, BUN 22, Potassium 4.7, Sodium 132, EGFR 42-49  11/14/2015 Creatinine 1.09, BUN 22, Potassium 3.8, Sodium 132, EGFR 44-51  11/12/2015 Creatinine 1.33, BUN 24, Potassium 3.9, Sodium 129, EGFR 35-40  Creatinine has ranged from 1.12 - 1.61 from 04/30/2015 to 11/07/2015  Recommendations: Daughter stated patient took Furosemide 40 mg 2/14 and today, 2/15 due to symptoms.  Explained the 20 mg dosage does not appear to be effective for eliminating fluid and would suggest to take the prescribed 40 mg daily.  Follow-up plan: ICM clinic phone appointment on 02/29/2016 to recheck fluid levels.  Office appointment with Dr Sallyanne Kuster on 03/12/2016.  Copy of ICM check sent to cardiologist/device physician.   3 month ICM trend: 02/20/2016   1 Year ICM trend:      [name], RN 02/28/2016 10:37 AM

## 2016-02-28 NOTE — Progress Notes (Signed)
She is a little stubborn. I'll talk to her about the diuretic MCr

## 2016-02-29 ENCOUNTER — Ambulatory Visit (INDEPENDENT_AMBULATORY_CARE_PROVIDER_SITE_OTHER): Payer: PPO

## 2016-02-29 DIAGNOSIS — I5042 Chronic combined systolic (congestive) and diastolic (congestive) heart failure: Secondary | ICD-10-CM | POA: Diagnosis not present

## 2016-02-29 DIAGNOSIS — Z950 Presence of cardiac pacemaker: Secondary | ICD-10-CM | POA: Diagnosis not present

## 2016-03-03 NOTE — Progress Notes (Signed)
EPIC Encounter for ICM Monitoring  Patient Name: Kathleen [name] is a 83 y.o. female Date: 03/03/2016 Primary Care Physican: Glo Herring., MD Primary Cardiologist:Croitoru Electrophysiologist: Croitoru Dry Weight:unknown Bi-V Pacing: 100%       Spoke with daughter [name] [facility]). She said patient is asymptomatic.    Thoracic impedance improved after taking 2 days of prescribed Furosemide dosage of 40 mg but still abnormal suggesting fluid accumulation.  Current prescribed dose of Furosemide 40 mg 1 tablet daily but does only takes 1/2 tablet daily since 02/11/2016 for fear of becoming dehydrated. Prior to 1/29 she was taking once a week.   Labs: 11/27/2015 Creatinine 1.01, BUN 21, Potassium 3.7, Sodium 132, EGFR 48-56 11/19/2015 Creatinine 0.94, BUN 17, Potassium 3.6, Sodium 133, EGFR 53->60  11/18/2015 Creatinine 1.13, BUN 22, Potassium 4.7, Sodium 132, EGFR 42-49  11/14/2015 Creatinine 1.09, BUN 22, Potassium 3.8, Sodium 132, EGFR 44-51  11/12/2015 Creatinine 1.33, BUN 24, Potassium 3.9, Sodium 129, EGFR 35-40  Creatinine has ranged from 1.12 - 1.61 from 04/30/2015 to 11/07/2015  Recommendations: No changes. Advised Dr Sallyanne Kuster will review and if any recommendations will call her back.  Encouraged to call for fluid symptoms.  Follow-up plan: ICM clinic phone appointment on 03/31/2016.  Office appointment with Dr Sallyanne Kuster on 03/12/2016 for pacer check.   3 month ICM trend: 03/03/2016   1 Year ICM trend:      [name], RN 03/03/2016 8:42 AM

## 2016-03-04 DIAGNOSIS — H524 Presbyopia: Secondary | ICD-10-CM | POA: Diagnosis not present

## 2016-03-04 DIAGNOSIS — Z961 Presence of intraocular lens: Secondary | ICD-10-CM | POA: Diagnosis not present

## 2016-03-04 DIAGNOSIS — E119 Type 2 diabetes mellitus without complications: Secondary | ICD-10-CM | POA: Diagnosis not present

## 2016-03-12 ENCOUNTER — Ambulatory Visit (INDEPENDENT_AMBULATORY_CARE_PROVIDER_SITE_OTHER): Payer: PPO | Admitting: Cardiovascular Disease

## 2016-03-12 VITALS — BP 152/80 | HR 78 | Ht 66.0 in | Wt 139.0 lb

## 2016-03-12 DIAGNOSIS — Z79899 Other long term (current) drug therapy: Secondary | ICD-10-CM | POA: Diagnosis not present

## 2016-03-12 DIAGNOSIS — I429 Cardiomyopathy, unspecified: Secondary | ICD-10-CM

## 2016-03-12 DIAGNOSIS — I441 Atrioventricular block, second degree: Secondary | ICD-10-CM

## 2016-03-12 DIAGNOSIS — D649 Anemia, unspecified: Secondary | ICD-10-CM | POA: Diagnosis not present

## 2016-03-12 DIAGNOSIS — I420 Dilated cardiomyopathy: Secondary | ICD-10-CM

## 2016-03-12 DIAGNOSIS — I5042 Chronic combined systolic (congestive) and diastolic (congestive) heart failure: Secondary | ICD-10-CM | POA: Diagnosis not present

## 2016-03-12 DIAGNOSIS — I480 Paroxysmal atrial fibrillation: Secondary | ICD-10-CM

## 2016-03-12 DIAGNOSIS — I5043 Acute on chronic combined systolic (congestive) and diastolic (congestive) heart failure: Secondary | ICD-10-CM

## 2016-03-12 DIAGNOSIS — Z950 Presence of cardiac pacemaker: Secondary | ICD-10-CM

## 2016-03-12 LAB — CUP PACEART INCLINIC DEVICE CHECK
Battery Remaining Longevity: 44 mo
Battery Voltage: 2.98 V
Brady Statistic RA Percent Paced: 96.26 %
Brady Statistic RV Percent Paced: 99.98 %
Implantable Lead Implant Date: 20130311
Implantable Lead Implant Date: 20130311
Implantable Lead Location: 753858
Implantable Lead Location: 753859
Implantable Lead Model: 4194
Implantable Pulse Generator Implant Date: 20131003
Lead Channel Impedance Value: 266 Ohm
Lead Channel Impedance Value: 342 Ohm
Lead Channel Impedance Value: 418 Ohm
Lead Channel Impedance Value: 570 Ohm
Lead Channel Pacing Threshold Amplitude: 1.25 V
Lead Channel Pacing Threshold Pulse Width: 0.4 ms
Lead Channel Pacing Threshold Pulse Width: 0.4 ms
Lead Channel Sensing Intrinsic Amplitude: 1.25 mV
Lead Channel Sensing Intrinsic Amplitude: 1.875 mV
Lead Channel Sensing Intrinsic Amplitude: 22.625 mV
Lead Channel Setting Pacing Pulse Width: 0.4 ms
Lead Channel Setting Sensing Sensitivity: 4 mV
MDC IDC LEAD IMPLANT DT: 20131003
MDC IDC LEAD LOCATION: 753860
MDC IDC MSMT LEADCHNL LV IMPEDANCE VALUE: 418 Ohm
MDC IDC MSMT LEADCHNL LV IMPEDANCE VALUE: 437 Ohm
MDC IDC MSMT LEADCHNL LV IMPEDANCE VALUE: 589 Ohm
MDC IDC MSMT LEADCHNL LV PACING THRESHOLD AMPLITUDE: 1 V
MDC IDC MSMT LEADCHNL LV PACING THRESHOLD PULSEWIDTH: 0.4 ms
MDC IDC MSMT LEADCHNL RA PACING THRESHOLD AMPLITUDE: 0.75 V
MDC IDC MSMT LEADCHNL RV IMPEDANCE VALUE: 342 Ohm
MDC IDC MSMT LEADCHNL RV IMPEDANCE VALUE: 456 Ohm
MDC IDC MSMT LEADCHNL RV SENSING INTR AMPL: 17.375 mV
MDC IDC SESS DTM: 20180228155154
MDC IDC SET LEADCHNL LV PACING AMPLITUDE: 2 V
MDC IDC SET LEADCHNL LV PACING PULSEWIDTH: 0.4 ms
MDC IDC SET LEADCHNL RA PACING AMPLITUDE: 2 V
MDC IDC SET LEADCHNL RV PACING AMPLITUDE: 2.5 V
MDC IDC STAT BRADY AP VP PERCENT: 96.26 %
MDC IDC STAT BRADY AP VS PERCENT: 0.01 %
MDC IDC STAT BRADY AS VP PERCENT: 3.73 %
MDC IDC STAT BRADY AS VS PERCENT: 0 %

## 2016-03-12 LAB — COMPREHENSIVE METABOLIC PANEL
ALBUMIN: 3.8 g/dL (ref 3.6–5.1)
ALT: 11 U/L (ref 6–29)
AST: 20 U/L (ref 10–35)
Alkaline Phosphatase: 98 U/L (ref 33–130)
BUN: 13 mg/dL (ref 7–25)
CHLORIDE: 99 mmol/L (ref 98–110)
CO2: 24 mmol/L (ref 20–31)
CREATININE: 1.16 mg/dL — AB (ref 0.60–0.88)
Calcium: 9.2 mg/dL (ref 8.6–10.4)
Glucose, Bld: 157 mg/dL — ABNORMAL HIGH (ref 65–99)
POTASSIUM: 4 mmol/L (ref 3.5–5.3)
SODIUM: 137 mmol/L (ref 135–146)
TOTAL PROTEIN: 6.4 g/dL (ref 6.1–8.1)
Total Bilirubin: 0.9 mg/dL (ref 0.2–1.2)

## 2016-03-12 LAB — CBC
HEMATOCRIT: 39.2 % (ref 35.0–45.0)
HEMOGLOBIN: 12.7 g/dL (ref 11.7–15.5)
MCH: 32.6 pg (ref 27.0–33.0)
MCHC: 32.4 g/dL (ref 32.0–36.0)
MCV: 100.5 fL — AB (ref 80.0–100.0)
MPV: 9 fL (ref 7.5–12.5)
Platelets: 234 10*3/uL (ref 140–400)
RBC: 3.9 MIL/uL (ref 3.80–5.10)
RDW: 14.4 % (ref 11.0–15.0)
WBC: 6.8 10*3/uL (ref 3.8–10.8)

## 2016-03-12 LAB — TSH: TSH: 3.54 mIU/L

## 2016-03-12 NOTE — Progress Notes (Signed)
Patient ID: Kathleen [name], female   DOB: 12/26/1924, 83 y.o.   MRN: [id number] Patient ID: Kathleen [name], female   DOB: [birth year], 83 y.o.   MRN: [id number]    Cardiology Office Note    Date:  03/13/2016   ID:  Kathleen [name], DOB 1924/01/30, MRN [id number]  PCP:  [name]., MD  Cardiologist:   Thurmon Fair, MD   Chief Complaint  Patient presents with  . Follow-up    History of Present Illness:  Kathleen [name] is a 83 y.o. female with long-standing nonischemic cardiomyopathy, high-grade AV block requiring permanent pacemaker therapy (upgrade to biventricular pacemaker after she developed congestive heart failure, excellent response to CRT), paroxysmal atrial fibrillation on chronic amiodarone and Eliquis therapy, hyperlipidemia, in for routine follow-up.   In January 2017 she fell and suffered a pelvic fracture and a right wrist fracture. In November she was trying to clean a ceiling fan, fell and fractured her hip.  Her blood pressure is little high today, but at home her typical systolic blood pressure is 130-135. Her scales are broken and she has been unable to weigh herself for a couple of weeks. She seems to describe functional class II shortness of breath. She became short of breath walking briskly from our waiting room, but has no difficulty dressing herself, walking in her house, making the bed. It appears that she has lost some real weight. She is taking only half a tablet of furosemide daily and frequently skips doses if she is planning any activities. She does not have orthopnea or PND.  Interrogation of her pacemaker shows 99% biventricular pacing. She also has 96% atrial pacing. Her heart rate histogram is quite flat, reflecting her sedentary status. Her activity level has been low due to her hip fracture, but is starting to rebound and she has gradually increased back up to about 1 hour a day. The device has not recorded atrial fibrillation or ventricular  tachycardia. She has not had any recent atrial fibrillation or ventricular high rates. Her thoracic impedance (optivol) has been abnormal since January 11. Generator longevity 2 years.  On the current diuretic dose she has maintained good volume status, without dyspnea or edema.   Past Medical History:  Diagnosis Date  . Acid reflux   . 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 CAD (coronary artery disease)   . Chronic kidney disease (CKD), stage III (moderate)   . Essential hypertension   . History of pneumonia   . Hyperlipidemia   . Macular degeneration   . Nonischemic cardiomyopathy (HCC)   . PAF (paroxysmal atrial fibrillation) (HCC)   . Type 2 diabetes mellitus (HCC)     Past Surgical History:  Procedure Laterality Date  . Bladder tack  1970's  . CARDIAC CATHETERIZATION  10/15/2011   Normal coronaries  . CARDIOVERSION N/A 10/28/2013   Procedure: CARDIOVERSION;  Surgeon: Thurmon Fair, MD;  Location: [facility] ENDOSCOPY;  Service: Cardiovascular;  Laterality: N/A;  . CHOLECYSTECTOMY  1999  . INTRAMEDULLARY (IM) NAIL INTERTROCHANTERIC Right 11/03/2015   Procedure: INTRAMEDULLARY (IM) NAIL RIGHT INTERTROCHANTERIC HIP FRACTURE;  Surgeon: Tarry Kos, MD;  Location: [facility] OR;  Service: Orthopedics;  Laterality: Right;  . LEFT AND RIGHT HEART CATHETERIZATION WITH CORONARY ANGIOGRAM N/A 10/15/2011   Procedure: LEFT AND RIGHT HEART CATHETERIZATION WITH CORONARY ANGIOGRAM;  Surgeon: Chrystie Nose, MD;  Location: [facility] CATH LAB;  Service: Cardiovascular;  Laterality: N/A;  . NM MYOCAR PERF WALL MOTION  03/15/2009   Normal  . PACEMAKER INSERTION  10/16/2011   Medtronic  . PERMANENT PACEMAKER INSERTION N/A 03/24/2011   Procedure: PERMANENT PACEMAKER INSERTION;  Surgeon: Thurmon Fair, MD;  Location: [facility] CATH LAB;  Service: Cardiovascular;  Laterality: N/A;  . REPLACEMENT TOTAL KNEE      Current Medications: Outpatient Medications Prior to Visit  Medication Sig Dispense Refill    . acetaminophen (TYLENOL) 325 MG tablet Take 650 mg by mouth every 4 (four) hours as needed.    Marland Kitchen amiodarone (PACERONE) 200 MG tablet Take 0.5 tablets (100 mg total) by mouth daily. 30 tablet 10  . AMITIZA 24 MCG capsule TAKE (1) CAPSULE BY MOUTH TWICE DAILY. 60 capsule 11  . Calcium Carbonate-Vitamin D (CALCIUM-VITAMIN D) 500-200 MG-UNIT tablet Take 1 tablet by mouth 2 (two) times daily.    . Coenzyme Q10 (CO Q 10 PO) Take 1 tablet by mouth daily.    Marland Kitchen Dextromethorphan-Guaifenesin (ROBITUSSIN COUGH+CHEST CONG DM) 5-100 MG/5ML LIQD Give 15 ml by mouth every 4 hours prn    . ELIQUIS 2.5 MG TABS tablet TAKE ONE TABLET BY MOUTH TWICE DAILY. 60 tablet 0  . feeding supplement, ENSURE ENLIVE, (ENSURE ENLIVE) LIQD Take 237 mLs by mouth 2 (two) times daily between meals. 237 mL 12  . ferrous sulfate 325 (65 FE) MG tablet Take 325 mg by mouth 2 (two) times daily with a meal.    . furosemide (LASIX) 40 MG tablet TAKE ONE TABLET BY MOUTH DAILY. 30 tablet 6  . glimepiride (AMARYL) 1 MG tablet Take 0.5 mg by mouth daily with breakfast.     . ipratropium-albuterol (DUONEB) 0.5-2.5 (3) MG/3ML SOLN Take 3 mLs by nebulization daily as needed.    . meclizine (ANTIVERT) 25 MG tablet Take 25 mg by mouth 3 (three) times daily as needed for dizziness.    . metoprolol succinate (TOPROL-XL) 50 MG 24 hr tablet Take 1 tablet (50 mg total) by mouth daily. Take with or immediately following a meal. 30 tablet 0  . Multiple Vitamins-Minerals (CEROVITE SENIOR) TABS Give 1 tablet by mouth daily    . Multiple Vitamins-Minerals (ICAPS AREDS 2) CAPS Take 1 capsule by mouth twice daily    . nitroGLYCERIN (NITROSTAT) 0.4 MG SL tablet DISSOLVE 1 TABLET UNDER TONGUE EVERY 5 MINUTES UP TO 15 MIN FOR CHESTPAIN. IF NO RELIEF CALL 911. 25 tablet 4  . omeprazole (PRILOSEC) 20 MG capsule TAKE ONE CAPSULE BY MOUTH DAILY. 30 capsule 6  . ondansetron (ZOFRAN) 4 MG tablet Take 4 mg by mouth every 4 (four) hours as needed for nausea or vomiting.     . polyethylene glycol (MIRALAX / GLYCOLAX) packet Take 17 g by mouth daily as needed.    . pravastatin (PRAVACHOL) 40 MG tablet TAKE 1 TABLET BY MOUTH EACH EVENING. 30 tablet 10   No facility-administered medications prior to visit.      Allergies:   Coumadin [warfarin sodium]; Meloxicam; Metformin and related; Morphine; Nabumetone; Oxycodone; and Sulfonamide derivatives   Social History   Social History  . Marital status: Widowed    Spouse name: N/A  . Number of children: N/A  . Years of education: N/A   Occupational History  . retired    Social History Main Topics  . Smoking status: Never Smoker  . Smokeless tobacco: Never Used  . Alcohol use No  . Drug use: No  . Sexual activity: Not Currently   Other Topics Concern  . Not on file   Social History Narrative  .  No narrative on file     Family History:  The patient's family history includes Heart attack in her brother; Heart murmur in her daughter; Suicidality in her mother.   ROS:   Please see the history of present illness.    ROS All other systems reviewed and are negative.   PHYSICAL EXAM:   VS:  BP (!) 152/80   Pulse 78   Ht 5\' 6"  (1.676 m)   Wt 63 kg (139 lb)   SpO2 96%   BMI 22.44 kg/m    GEN: Well nourished, well developed, in no acute distress, she appears more frail than in the past and is very slender  HEENT: normal  Neck: no JVD, carotid bruits, or masses Cardiac: Paradoxically split second heart sound, RRR; no murmurs, rubs, or gallops, 1+ bilateral ankle edema , healthy left subclavian pacemaker site Respiratory:  clear to auscultation bilaterally, normal work of breathing GI: soft, nontender, nondistended, + BS MS: no deformity or atrophy  Skin: warm and dry, no rash Neuro:  Alert and Oriented x 3, Strength and sensation are intact Psych: euthymic mood, full affect  Wt Readings from Last 3 Encounters:  03/12/16 63 kg (139 lb)  11/18/15 64.2 kg (141 lb 8.6 oz)  11/07/15 70.7 kg (155 lb  13.8 oz)      Studies/Labs Reviewed:   EKG:  EKG is not ordered today.  The intracardiac electrogram demonstrates AV sequential pacing  Recent Labs: 04/30/2015: B Natriuretic Peptide 589.0 05/04/2015: Magnesium 1.7 03/12/2016: ALT 11; BUN 13; Creat 1.16; Hemoglobin 12.7; Platelets 234; Potassium 4.0; Sodium 137; TSH 3.54   Lipid Panel    Component Value Date/Time   CHOL 151 04/11/2014 0820   TRIG 108 04/11/2014 0820   HDL 60 04/11/2014 0820   CHOLHDL 2.5 04/11/2014 0820   VLDL 22 04/11/2014 0820   LDLCALC 69 04/11/2014 0820      ASSESSMENT:    1. Acute on chronic combined systolic and diastolic CHF, NYHA class 1 (HCC)   2. Symptomatic advanced heart block, 2:1 AV block, RT BBB, Lt post. fas. block.(March 2013)   3. CRT - pacemaker Medtronic   4. Paroxysmal atrial fibrillation (HCC)   5. On amiodarone therapy   6. Anemia, unspecified type   7. Cardiomyopathy, dilated, nonischemic (HCC)      PLAN:  In order of problems listed above:  1. CHF: She appears mildly hypervolemic. Think we need to recalibrate her "dry weight", probably around 135 lb on our office scale, 130 lb on her home scale. Is very important that she get a new set of working scales and uses them on a daily basis. 20 mg of furosemide daily may be sufficient to get rid of her excess volume, but she needs to stop skipping doses. If she has planned activities in the morning, she should take the furosemide in the early afternoon, after these activities are over. Echo performed April 2017 shows an ejection fraction of 40-45 % and showed evidence of restrictive filling consistent with severely elevated filling pressures. She has a severely dilated left atrium and moderate mitral insufficiency, likely secondary to the cardiomyopathy and volume overload 2. High grade AV block: Over the years and with added treatment with amiodarone, she has progressed to essentially complete heart block. She should be considered pacemaker  dependent. 3. CRT-P: Normal function of her dual-chamber biventricular pacemaker. All lead parameters are in normal range. Thoracic impedance had returned to baseline, is enrolled in the heart failure device clinic: check  Optivol monthly. 4. AFib: None recorded recently. She is at high embolic risk and is on appropriate anticoagulation, adjusted for age and small body size. CHADSVasc 5 (age 85, gender, HF, HTN).  5. Amiodarone: On long-term amiodarone. Liver and thyroid function tests rechecked today are normal. No evidence of anemia on labs ordered today, but has mild macrocytosis. The 12 was normal when checked in 2013   Medication Adjustments/Labs and Tests Ordered: Current medicines are reviewed at length with the patient today.  Concerns regarding medicines are outlined above.  Medication changes, Labs and Tests ordered today are listed in the Patient Instructions below. Patient Instructions  Medication Instructions: Dr Royann Shivers recommends that you continue on your current medications as directed. Please refer to the Current Medication list given to you today.  Labwork: Your physician recommends that you return for lab work TODAY.  Testing/Procedures: NONE ORDERED  Follow-up: Dr Royann Shivers recommends that you schedule a follow-up appointment in 3 months with a pacemaker.  If you need a refill on your cardiac medications before your next appointment, please call your pharmacy.   Dr Royann Shivers recommends that you purchase a scale so you can start weighing yourself, daily, at the same time every day, and in the same amount of clothing. Please record your daily weights on the handout provided and bring it to your next appointment.  Daily Weight Record It is important to weigh yourself daily. Keep this daily weight chart near your scale. Weigh yourself each morning at the same time. Weigh yourself without shoes, and wear the same amount of clothing each day. Compare today's weight to  yesterday's weight. Bring this form with you to your follow-up appointments. Call your health care provider if you have concerns about your weight, including rapid weight gain or rapid weight loss.  Date: ________ Weight: ____________________ Date: ________ Weight: ____________________ Date: ________ Weight: ____________________ Date: ________ Weight: ____________________ Date: ________ Weight: ____________________ Date: ________ Weight: ____________________ Date: ________ Weight: ____________________ Date: ________ Weight: ____________________ Date: ________ Weight: ____________________ Date: ________ Weight: ____________________ Date: ________ Weight: ____________________ Date: ________ Weight: ____________________ Date: ________ Weight: ____________________ Date: ________ Weight: ____________________ Date: ________ Weight: ____________________ Date: ________ Weight: ____________________ Date: ________ Weight: ____________________ Date: ________ Weight: ____________________ Date: ________ Weight: ____________________ Date: ________ Weight: ____________________ Date: ________ Weight: ____________________ Date: ________ Weight: ____________________ Date: ________ Weight: ____________________ Date: ________ Weight: ____________________ Date: ________ Weight: ____________________ Date: ________ Weight: ____________________ Date: ________ Weight: ____________________ Date: ________ Weight: ____________________ Date: ________ Weight: ____________________ Date: ________ Weight: ____________________ Date: ________ Weight: ____________________ Date: ________ Weight: ____________________ Date: ________ Weight: ____________________ Date: ________ Weight: ____________________ Date: ________ Weight: ____________________ Date: ________ Weight: ____________________ Date: ________ Weight: ____________________ Date: ________ Weight: ____________________ Date: ________ Weight: ____________________ Date:  ________ Weight: ____________________ Date: ________ Weight: ____________________ Date: ________ Weight: ____________________ Date: ________ Weight: ____________________ Date: ________ Weight: ____________________ Date: ________ Weight: ____________________ Date: ________ Weight: ____________________ Date: ________ Weight: ____________________ Date: ________ Weight: ____________________ Date: ________ Weight: ____________________ Date: ________ Weight: ____________________ This information is not intended to replace advice given to you by your health care provider. Make sure you discuss any questions you have with your health care provider. Document Released: 03/13/2006 Document Revised: 12/14/2015 Document Reviewed: 07/29/2013 Elsevier Interactive Patient Education  [address].      Signed, Thurmon Fair, MD  03/13/2016 2:34 PM    [facility] [address] [place], [place], [place]  [address] Phone: [phone]; Fax: [phone]

## 2016-03-12 NOTE — Patient Instructions (Signed)
Medication Instructions: Dr Royann Shivers recommends that you continue on your current medications as directed. Please refer to the Current Medication list given to you today.  Labwork: Your physician recommends that you return for lab work TODAY.  Testing/Procedures: NONE ORDERED  Follow-up: Dr Royann Shivers recommends that you schedule a follow-up appointment in 3 months with a pacemaker.  If you need a refill on your cardiac medications before your next appointment, please call your pharmacy.   Dr Royann Shivers recommends that you purchase a scale so you can start weighing yourself, daily, at the same time every day, and in the same amount of clothing. Please record your daily weights on the handout provided and bring it to your next appointment.  Daily Weight Record It is important to weigh yourself daily. Keep this daily weight chart near your scale. Weigh yourself each morning at the same time. Weigh yourself without shoes, and wear the same amount of clothing each day. Compare today's weight to yesterday's weight. Bring this form with you to your follow-up appointments. Call your health care provider if you have concerns about your weight, including rapid weight gain or rapid weight loss.  Date: ________ Weight: ____________________ Date: ________ Weight: ____________________ Date: ________ Weight: ____________________ Date: ________ Weight: ____________________ Date: ________ Weight: ____________________ Date: ________ Weight: ____________________ Date: ________ Weight: ____________________ Date: ________ Weight: ____________________ Date: ________ Weight: ____________________ Date: ________ Weight: ____________________ Date: ________ Weight: ____________________ Date: ________ Weight: ____________________ Date: ________ Weight: ____________________ Date: ________ Weight: ____________________ Date: ________ Weight: ____________________ Date: ________ Weight: ____________________ Date: ________  Weight: ____________________ Date: ________ Weight: ____________________ Date: ________ Weight: ____________________ Date: ________ Weight: ____________________ Date: ________ Weight: ____________________ Date: ________ Weight: ____________________ Date: ________ Weight: ____________________ Date: ________ Weight: ____________________ Date: ________ Weight: ____________________ Date: ________ Weight: ____________________ Date: ________ Weight: ____________________ Date: ________ Weight: ____________________ Date: ________ Weight: ____________________ Date: ________ Weight: ____________________ Date: ________ Weight: ____________________ Date: ________ Weight: ____________________ Date: ________ Weight: ____________________ Date: ________ Weight: ____________________ Date: ________ Weight: ____________________ Date: ________ Weight: ____________________ Date: ________ Weight: ____________________ Date: ________ Weight: ____________________ Date: ________ Weight: ____________________ Date: ________ Weight: ____________________ Date: ________ Weight: ____________________ Date: ________ Weight: ____________________ Date: ________ Weight: ____________________ Date: ________ Weight: ____________________ Date: ________ Weight: ____________________ Date: ________ Weight: ____________________ Date: ________ Weight: ____________________ Date: ________ Weight: ____________________ Date: ________ Weight: ____________________ Date: ________ Weight: ____________________ This information is not intended to replace advice given to you by your health care provider. Make sure you discuss any questions you have with your health care provider. Document Released: 03/13/2006 Document Revised: 12/14/2015 Document Reviewed: 07/29/2013 Elsevier Interactive Patient Education  2017 [employer].

## 2016-03-13 ENCOUNTER — Encounter: Payer: Self-pay | Admitting: Cardiovascular Disease

## 2016-03-16 ENCOUNTER — Other Ambulatory Visit: Payer: Self-pay | Admitting: Cardiology

## 2016-03-31 ENCOUNTER — Telehealth: Payer: Self-pay | Admitting: Cardiology

## 2016-03-31 ENCOUNTER — Ambulatory Visit (INDEPENDENT_AMBULATORY_CARE_PROVIDER_SITE_OTHER): Payer: PPO

## 2016-03-31 DIAGNOSIS — Z950 Presence of cardiac pacemaker: Secondary | ICD-10-CM

## 2016-03-31 DIAGNOSIS — I5042 Chronic combined systolic (congestive) and diastolic (congestive) heart failure: Secondary | ICD-10-CM | POA: Diagnosis not present

## 2016-03-31 NOTE — Telephone Encounter (Signed)
Spoke with pt and reminded pt of remote transmission that is due today. Pt verbalized understanding.   

## 2016-04-01 NOTE — Progress Notes (Signed)
EPIC Encounter for ICM Monitoring  Patient Name: Kathleen [name] is a 83 y.o. female Date: 04/01/2016 Primary Care Physican: Glo Herring., MD Primary Cardiologist:Croitoru Electrophysiologist: Croitoru Dry Weight:unknown Bi-V Pacing: 100%      Attempted call to daughter [name].  Transmission reviewed.   Thoracic impedance normal in last 2 weeks.  Current prescribed dose of Furosemide 40 mg 1 tablet daily  Labs: 03/12/2016 Creatinine 1.16, BUN 13, Potassium 4.0, Sodium 137 11/27/2015 Creatinine 1.01, BUN 21, Potassium 3.7, Sodium 132, EGFR 48-56 11/19/2015 Creatinine 0.94, BUN 17, Potassium 3.6, Sodium 133, EGFR 53->60  11/18/2015 Creatinine 1.13, BUN 22, Potassium 4.7, Sodium 132, EGFR 42-49  11/14/2015 Creatinine 1.09, BUN 22, Potassium 3.8, Sodium 132, EGFR 44-51  11/12/2015 Creatinine 1.33, BUN 24, Potassium 3.9, Sodium 129, EGFR 35-40  Creatinine has ranged from 1.12 - 1.61 from 04/30/2015 to 11/07/2015  Recommendations: NONE - Unable to reach patient   Follow-up plan: ICM clinic phone appointment on 05/02/2016.  Copy of ICM check sent to cardiologist/device physician.   3 month ICM trend: 04/01/2016   1 Year ICM trend:      [name], RN 04/01/2016 9:23 AM

## 2016-04-08 ENCOUNTER — Encounter (INDEPENDENT_AMBULATORY_CARE_PROVIDER_SITE_OTHER): Payer: Self-pay | Admitting: Internal Medicine

## 2016-04-08 ENCOUNTER — Ambulatory Visit (INDEPENDENT_AMBULATORY_CARE_PROVIDER_SITE_OTHER): Payer: PPO | Admitting: Internal Medicine

## 2016-04-08 VITALS — BP 120/60 | HR 88 | Temp 97.5°F | Ht 67.0 in | Wt 133.2 lb

## 2016-04-08 DIAGNOSIS — K5909 Other constipation: Secondary | ICD-10-CM

## 2016-04-08 NOTE — Progress Notes (Signed)
Subjective:    Patient ID: Kathleen [name], female    DOB: Jan 15, 1924, 83 y.o.   MRN: [id number] Wt 139 Ht 67 in September 2017 HPI Here today for f/u. She was last seen in September for constipation. Taking Amitiza BID. She tells me she is having a BM x 2 a daily usually. Appetite is good. She has lost about 6 pounds since her visit in September. She denies any abdominal pain.    Recently suffered a rt hip fracture with hip replacement in November 2017. She did 3 weeks of rehab at the [facility].  Hx significant for atrial fib and CAD and maintained on Eliquis. Hx of pacemaker  Review of Systems Past Medical History:  Diagnosis Date  . Acid reflux   . 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 CAD (coronary artery disease)   . Chronic kidney disease (CKD), stage III (moderate)   . Essential hypertension   . History of pneumonia   . Hyperlipidemia   . Macular degeneration   . Nonischemic cardiomyopathy (HCC)   . PAF (paroxysmal atrial fibrillation) (HCC)   . Type 2 diabetes mellitus (HCC)     Past Surgical History:  Procedure Laterality Date  . Bladder tack  1970's  . CARDIAC CATHETERIZATION  10/15/2011   Normal coronaries  . CARDIOVERSION N/A 10/28/2013   Procedure: CARDIOVERSION;  Surgeon: Thurmon Fair, MD;  Location: [facility] ENDOSCOPY;  Service: Cardiovascular;  Laterality: N/A;  . CHOLECYSTECTOMY  1999  . INTRAMEDULLARY (IM) NAIL INTERTROCHANTERIC Right 11/03/2015   Procedure: INTRAMEDULLARY (IM) NAIL RIGHT INTERTROCHANTERIC HIP FRACTURE;  Surgeon: Tarry Kos, MD;  Location: [facility] OR;  Service: Orthopedics;  Laterality: Right;  . LEFT AND RIGHT HEART CATHETERIZATION WITH CORONARY ANGIOGRAM N/A 10/15/2011   Procedure: LEFT AND RIGHT HEART CATHETERIZATION WITH CORONARY ANGIOGRAM;  Surgeon: Chrystie Nose, MD;  Location: [facility] CATH LAB;  Service: Cardiovascular;  Laterality: N/A;  . NM MYOCAR PERF WALL MOTION  03/15/2009   Normal  . PACEMAKER INSERTION   10/16/2011   Medtronic  . PERMANENT PACEMAKER INSERTION N/A 03/24/2011   Procedure: PERMANENT PACEMAKER INSERTION;  Surgeon: Thurmon Fair, MD;  Location: [facility] CATH LAB;  Service: Cardiovascular;  Laterality: N/A;  . REPLACEMENT TOTAL KNEE      Allergies  Allergen Reactions  . Coumadin [Warfarin Sodium] Other (See Comments)    Caused Patient to Bleed Out.   . Meloxicam Other (See Comments)    Unknown  . Metformin And Related Other (See Comments)    Cannot have due to kidney function  . Morphine Itching    Not in right state of mind.   . Nabumetone Nausea And Vomiting  . Oxycodone Nausea And Vomiting  . Sulfonamide Derivatives Hives    Current Outpatient Prescriptions on File Prior to Visit  Medication Sig Dispense Refill  . acetaminophen (TYLENOL) 325 MG tablet Take 650 mg by mouth every 4 (four) hours as needed.    Marland Kitchen amiodarone (PACERONE) 200 MG tablet Take 0.5 tablets (100 mg total) by mouth daily. 30 tablet 10  . AMITIZA 24 MCG capsule TAKE (1) CAPSULE BY MOUTH TWICE DAILY. 60 capsule 11  . Calcium Carbonate-Vitamin D (CALCIUM-VITAMIN D) 500-200 MG-UNIT tablet Take 1 tablet by mouth 2 (two) times daily.    . Coenzyme Q10 (CO Q 10 PO) Take 1 tablet by mouth daily.    Marland Kitchen ELIQUIS 2.5 MG TABS tablet TAKE ONE TABLET BY MOUTH TWICE DAILY. 60 tablet 5  .  feeding supplement, ENSURE ENLIVE, (ENSURE ENLIVE) LIQD Take 237 mLs by mouth 2 (two) times daily between meals. 237 mL 12  . ferrous sulfate 325 (65 FE) MG tablet Take 325 mg by mouth 2 (two) times daily with a meal.    . furosemide (LASIX) 40 MG tablet TAKE ONE TABLET BY MOUTH DAILY. 30 tablet 6  . glimepiride (AMARYL) 1 MG tablet Take 0.5 mg by mouth daily with breakfast.     . metoprolol succinate (TOPROL-XL) 50 MG 24 hr tablet Take 1 tablet (50 mg total) by mouth daily. Take with or immediately following a meal. (Patient taking differently: Take 75 mg by mouth daily. Take with or immediately following a meal.) 30 tablet 0  . Multiple  Vitamins-Minerals (CEROVITE SENIOR) TABS Give 1 tablet by mouth daily    . Multiple Vitamins-Minerals (ICAPS AREDS 2) CAPS Take 1 capsule by mouth twice daily    . nitroGLYCERIN (NITROSTAT) 0.4 MG SL tablet DISSOLVE 1 TABLET UNDER TONGUE EVERY 5 MINUTES UP TO 15 MIN FOR CHESTPAIN. IF NO RELIEF CALL 911. 25 tablet 4  . ondansetron (ZOFRAN) 4 MG tablet Take 4 mg by mouth every 4 (four) hours as needed for nausea or vomiting.    . polyethylene glycol (MIRALAX / GLYCOLAX) packet Take 17 g by mouth daily as needed.    . potassium chloride SA (K-DUR,KLOR-CON) 20 MEQ tablet Take 20 mEq by mouth 2 (two) times daily.    . pravastatin (PRAVACHOL) 40 MG tablet TAKE 1 TABLET BY MOUTH EACH EVENING. 30 tablet 10  . Dextromethorphan-Guaifenesin (ROBITUSSIN COUGH+CHEST CONG DM) 5-100 MG/5ML LIQD Give 15 ml by mouth every 4 hours prn    . [DISCONTINUED] benazepril (LOTENSIN) 40 MG tablet Take 40 mg by mouth 2 (two) times daily.     No current facility-administered medications on file prior to visit.        Objective:   Physical Exam Blood pressure 120/60, pulse 88, temperature 97.5 F (36.4 C), height 5\' 7"  (1.702 m), weight 133 lb 3.2 oz (60.4 kg).  Alert and oriented. Skin warm and dry. Oral mucosa is moist.   . Sclera anicteric, conjunctivae is pink. Thyroid not enlarged. No cervical lymphadenopathy. Lungs clear. Heart regular rate and rhythm.  Abdomen is soft. Bowel sounds are positive. No hepatomegaly. No abdominal masses felt. No tenderness.  No edema to lower extremities.        Assessment & Plan:  Constipation.  Continue the Amitiza OV in 1year

## 2016-04-08 NOTE — Patient Instructions (Signed)
OV in 1 year.  

## 2016-04-15 DIAGNOSIS — I10 Essential (primary) hypertension: Secondary | ICD-10-CM | POA: Diagnosis not present

## 2016-04-15 DIAGNOSIS — E782 Mixed hyperlipidemia: Secondary | ICD-10-CM | POA: Diagnosis not present

## 2016-04-15 DIAGNOSIS — J449 Chronic obstructive pulmonary disease, unspecified: Secondary | ICD-10-CM | POA: Diagnosis not present

## 2016-04-15 DIAGNOSIS — N183 Chronic kidney disease, stage 3 (moderate): Secondary | ICD-10-CM | POA: Diagnosis not present

## 2016-04-15 DIAGNOSIS — E119 Type 2 diabetes mellitus without complications: Secondary | ICD-10-CM | POA: Diagnosis not present

## 2016-04-15 DIAGNOSIS — I2510 Atherosclerotic heart disease of native coronary artery without angina pectoris: Secondary | ICD-10-CM | POA: Diagnosis not present

## 2016-04-15 DIAGNOSIS — Z6820 Body mass index (BMI) 20.0-20.9, adult: Secondary | ICD-10-CM | POA: Diagnosis not present

## 2016-04-15 DIAGNOSIS — Z1389 Encounter for screening for other disorder: Secondary | ICD-10-CM | POA: Diagnosis not present

## 2016-04-15 DIAGNOSIS — E1129 Type 2 diabetes mellitus with other diabetic kidney complication: Secondary | ICD-10-CM | POA: Diagnosis not present

## 2016-04-15 DIAGNOSIS — Z0001 Encounter for general adult medical examination with abnormal findings: Secondary | ICD-10-CM | POA: Diagnosis not present

## 2016-05-02 ENCOUNTER — Ambulatory Visit (INDEPENDENT_AMBULATORY_CARE_PROVIDER_SITE_OTHER): Payer: PPO

## 2016-05-02 ENCOUNTER — Telehealth: Payer: Self-pay | Admitting: Cardiology

## 2016-05-02 DIAGNOSIS — I5042 Chronic combined systolic (congestive) and diastolic (congestive) heart failure: Secondary | ICD-10-CM | POA: Diagnosis not present

## 2016-05-02 DIAGNOSIS — Z950 Presence of cardiac pacemaker: Secondary | ICD-10-CM

## 2016-05-02 NOTE — Progress Notes (Signed)
EPIC Encounter for ICM Monitoring  Patient Name: Kathleen [name] is a 83 y.o. female Date: 05/02/2016 Primary Care Physican: Glo Herring, MD Primary Cardiologist:Croitoru Electrophysiologist: Croitoru Dry Weight:unknown Bi-V Pacing: 100%           Heart Failure questions reviewed, pt asymptomatic    Thoracic impedance normal   Current prescribed dose of Furosemide 40 mg 1 tablet daily.  Potassium 20 mEq 1 tablet twice a day.   Labs: 03/12/2016 Creatinine 1.16, BUN 13, Potassium 4.0, Sodium 137 11/27/2015 Creatinine 1.01, BUN 21, Potassium 3.7, Sodium 132, EGFR 48-56 11/19/2015 Creatinine 0.94, BUN 17, Potassium 3.6, Sodium 133, EGFR 53->60  11/18/2015 Creatinine 1.13, BUN 22, Potassium 4.7, Sodium 132, EGFR 42-49  11/14/2015 Creatinine 1.09, BUN 22, Potassium 3.8, Sodium 132, EGFR 44-51  11/12/2015 Creatinine 1.33, BUN 24, Potassium 3.9, Sodium 129, EGFR 35-40  Creatinine has ranged from 1.12 - 1.61 from 04/30/2015 to 11/07/2015  Recommendations: No changes.  Encouraged to call for fluid symptoms.  Follow-up plan: ICM clinic phone appointment on 06/02/2016.  Office appointment scheduled on 06/11/2016 with Dr Sallyanne Kuster.  Copy of ICM check sent to device physician.   3 month ICM trend: 05/02/2016  1 Year ICM trend:      [name], RN 05/02/2016 10:18 AM

## 2016-05-02 NOTE — Telephone Encounter (Signed)
Confirmed remote transmission w/ pt daughter.   

## 2016-06-02 ENCOUNTER — Telehealth: Payer: Self-pay | Admitting: Cardiology

## 2016-06-02 ENCOUNTER — Ambulatory Visit (INDEPENDENT_AMBULATORY_CARE_PROVIDER_SITE_OTHER): Payer: PPO

## 2016-06-02 DIAGNOSIS — Z950 Presence of cardiac pacemaker: Secondary | ICD-10-CM

## 2016-06-02 DIAGNOSIS — I5042 Chronic combined systolic (congestive) and diastolic (congestive) heart failure: Secondary | ICD-10-CM | POA: Diagnosis not present

## 2016-06-02 NOTE — Telephone Encounter (Signed)
LMOVM reminding pt to send remote transmission.   

## 2016-06-03 ENCOUNTER — Telehealth: Payer: Self-pay

## 2016-06-03 NOTE — Telephone Encounter (Signed)
Remote ICM transmission received.  Attempted patient call and left detailed message regarding transmission and next ICM scheduled for 07/15/2016.  Advised to return call for any fluid symptoms or questions.    

## 2016-06-03 NOTE — Progress Notes (Signed)
EPIC Encounter for ICM Monitoring  Patient Name: Kathleen [name] is a 83 y.o. female Date: 06/03/2016 Primary Care Physican: Redmond School, MD Primary Cardiologist:Croitoru Electrophysiologist: Croitoru Dry Weight:unknown Bi-V Pacing: 99.8%      Attempted call to patient and unable to reach.  Left detailed message regarding transmission.  Transmission reviewed.    Thoracic impedance normal but had some abnormal days in the last month.  Current prescribed dose of Furosemide 40 mg 1 tablet daily.  Potassium 20 mEq 1 tablet twice a day.   Labs: 03/12/2016 Creatinine 1.16, BUN 13, Potassium 4.0, Sodium 137 11/27/2015 Creatinine 1.01, BUN 21, Potassium 3.7, Sodium 132, EGFR 48-56 11/19/2015 Creatinine 0.94, BUN 17, Potassium 3.6, Sodium 133, EGFR 53->60  11/18/2015 Creatinine 1.13, BUN 22, Potassium 4.7, Sodium 132, EGFR 42-49  11/14/2015 Creatinine 1.09, BUN 22, Potassium 3.8, Sodium 132, EGFR 44-51  11/12/2015 Creatinine 1.33, BUN 24, Potassium 3.9, Sodium 129, EGFR 35-40  Creatinine has ranged from 1.12 - 1.61 from 04/30/2015 to 11/07/2015  Recommendations: Left voice mail with ICM number and encouraged to call for fluid symptoms.  Follow-up plan: ICM clinic phone appointment on 07/15/2016.  Office appointment scheduled on 06/11/2016 with Dr Sallyanne Kuster.  Copy of ICM check sent to cardiologist/device physician.   3 month ICM trend: 06/03/2016   1 Year ICM trend:      [name], RN 06/03/2016 10:58 AM

## 2016-06-08 DIAGNOSIS — Z79899 Other long term (current) drug therapy: Secondary | ICD-10-CM

## 2016-06-08 DIAGNOSIS — Z5181 Encounter for therapeutic drug level monitoring: Secondary | ICD-10-CM | POA: Insufficient documentation

## 2016-06-08 NOTE — Progress Notes (Signed)
Error- appt rescheduled

## 2016-06-11 ENCOUNTER — Ambulatory Visit (INDEPENDENT_AMBULATORY_CARE_PROVIDER_SITE_OTHER): Payer: Self-pay | Admitting: Cardiovascular Disease

## 2016-06-11 DIAGNOSIS — I480 Paroxysmal atrial fibrillation: Secondary | ICD-10-CM

## 2016-06-11 DIAGNOSIS — I5043 Acute on chronic combined systolic (congestive) and diastolic (congestive) heart failure: Secondary | ICD-10-CM

## 2016-06-11 DIAGNOSIS — I5042 Chronic combined systolic (congestive) and diastolic (congestive) heart failure: Secondary | ICD-10-CM

## 2016-06-11 DIAGNOSIS — Z79899 Other long term (current) drug therapy: Secondary | ICD-10-CM

## 2016-06-11 DIAGNOSIS — Z5181 Encounter for therapeutic drug level monitoring: Secondary | ICD-10-CM

## 2016-06-11 DIAGNOSIS — Z950 Presence of cardiac pacemaker: Secondary | ICD-10-CM

## 2016-06-19 ENCOUNTER — Other Ambulatory Visit: Payer: Self-pay | Admitting: Cardiovascular Disease

## 2016-07-14 ENCOUNTER — Other Ambulatory Visit: Payer: Self-pay | Admitting: Cardiovascular Disease

## 2016-07-14 NOTE — Telephone Encounter (Signed)
Rx(s) sent to pharmacy electronically.  

## 2016-07-15 ENCOUNTER — Telehealth: Payer: Self-pay | Admitting: Cardiology

## 2016-07-15 ENCOUNTER — Ambulatory Visit (INDEPENDENT_AMBULATORY_CARE_PROVIDER_SITE_OTHER): Payer: PPO

## 2016-07-15 DIAGNOSIS — I5042 Chronic combined systolic (congestive) and diastolic (congestive) heart failure: Secondary | ICD-10-CM | POA: Diagnosis not present

## 2016-07-15 DIAGNOSIS — Z950 Presence of cardiac pacemaker: Secondary | ICD-10-CM

## 2016-07-15 NOTE — Telephone Encounter (Signed)
LMOVM reminding pt to send remote transmission.   

## 2016-07-18 ENCOUNTER — Telehealth: Payer: Self-pay

## 2016-07-18 MED ORDER — AMIODARONE HCL 200 MG PO TABS: 200.0000 mg | ORAL_TABLET | Freq: Every day | ORAL | 3 refills | Status: DC

## 2016-07-18 NOTE — Progress Notes (Signed)
In atrial fibrillation for last 10 days or so, but still has 100% CRT pacing. Chelley, please confirm that she is taking her amiodarone and Eliquis. Her activity level has also decreased since the atrial fibrillation started. How is she feeling? She has an appt in 3 weeks, do we need to move it up (I would if her dyspnea is worse). MCr

## 2016-07-18 NOTE — Progress Notes (Signed)
Called patient. Patient states she has been "lazy." She broke her hip in October or November 2017. She reports that she has been trying to walk more but is limited d/t soreness of her hip. Otherwise, she feels fine. Denies worsening dyspnea.  Patient is taking Eliquis as prescribed. She is actually taking Amiodarone 100 mg daily (1/2 of a 200 mg tablet). Spent >15 minutes reviewing medications. Med list updated per patient's verbal report.   Other changes are as follows: --Metoprolol Succinate 25 mg - 3 tablets daily --Amitiza 8 mcg - 1 capsule twice daily --Ferrous Sulfate 325 mg - 1 tablet daily --No longer taking Calcium-Vit D --No longer taking Potassium

## 2016-07-18 NOTE — Telephone Encounter (Signed)
Patient notified, verbalized understanding, and agreed with plan. New prescription with updated instructions sent to pharmacy electronically.

## 2016-07-18 NOTE — Progress Notes (Signed)
EPIC Encounter for ICM Monitoring  Patient Name: Kathleen [name] is a 83 y.o. female Date: 07/18/2016 Primary Care Physican: Redmond School, MD Primary Cardiologist:Croitoru Electrophysiologist: Croitoru Dry Weight:unknown Bi-V Pacing: 99.6%   Clinical Status (03-Jun-2016 to 17-Jul-2016) AT/AF 1 episode  Time in AT/AF 5.7 hr/day (23.9%)  Observations (2) (03-Jun-2016 to 17-Jul-2016)  11 days with more than 6 hr AT/AF.  Patient Activity less than 1 hr/day for 2 weeks.      Attempted call to patient and unable to reach.  Left detailed message regarding transmission.  Transmission reviewed.    Thoracic impedance normal with some days suggesting small amounts of fluid accumulation.  Current prescribed dose of Furosemide 40 mg 1 tablet daily. Potassium 20 mEq 1 tablet twice a day.   Labs: 03/12/2016 Creatinine 1.16, BUN 13, Potassium 4.0, Sodium 137 11/27/2015 Creatinine 1.01, BUN 21, Potassium 3.7, Sodium 132, EGFR 48-56 11/19/2015 Creatinine 0.94, BUN 17, Potassium 3.6, Sodium 133, EGFR 53->60  11/18/2015 Creatinine 1.13, BUN 22, Potassium 4.7, Sodium 132, EGFR 42-49  11/14/2015 Creatinine 1.09, BUN 22, Potassium 3.8, Sodium 132, EGFR 44-51  11/12/2015 Creatinine 1.33, BUN 24, Potassium 3.9, Sodium 129, EGFR 35-40  Creatinine has ranged from 1.12 - 1.61 from 04/30/2015 to 11/07/2015  Recommendations: Left voice mail with ICM number and encouraged to call for fluid symptoms.  Follow-up plan: ICM clinic phone appointment on 09/18/2016.  Office appointment scheduled 08/13/2016 with Dr. Sallyanne Kuster.  Copy of ICM check sent to cardiologist/device physician.   3 month ICM trend: 07/17/2016   AT/AF   1 Year ICM trend:      [name], RN 07/18/2016 9:01 AM

## 2016-07-18 NOTE — Telephone Encounter (Signed)
-----   Message from Thurmon Fair, MD sent at 07/18/2016  3:16 PM EDT ----- Please ask her to increase the amiodarone to 200 mg once daily. MCr ----- Message ----- From: Neta Ehlers, CMA Sent: 07/18/2016   2:35 PM To: Thurmon Fair, MD

## 2016-07-18 NOTE — Telephone Encounter (Signed)
Remote ICM transmission received.  Attempted patient call and left detailed message regarding transmission and next ICM scheduled for 09/18/2016.  Advised to return call for any fluid symptoms or questions.    

## 2016-08-07 ENCOUNTER — Other Ambulatory Visit: Payer: Self-pay | Admitting: Cardiovascular Disease

## 2016-08-13 ENCOUNTER — Ambulatory Visit (INDEPENDENT_AMBULATORY_CARE_PROVIDER_SITE_OTHER): Payer: PPO | Admitting: Cardiovascular Disease

## 2016-08-13 ENCOUNTER — Encounter: Payer: Self-pay | Admitting: Cardiovascular Disease

## 2016-08-13 VITALS — BP 146/84 | HR 73 | Ht 66.0 in | Wt 136.6 lb

## 2016-08-13 DIAGNOSIS — Z5181 Encounter for therapeutic drug level monitoring: Secondary | ICD-10-CM

## 2016-08-13 DIAGNOSIS — Z950 Presence of cardiac pacemaker: Secondary | ICD-10-CM | POA: Diagnosis not present

## 2016-08-13 DIAGNOSIS — I5042 Chronic combined systolic (congestive) and diastolic (congestive) heart failure: Secondary | ICD-10-CM | POA: Diagnosis not present

## 2016-08-13 DIAGNOSIS — I420 Dilated cardiomyopathy: Secondary | ICD-10-CM | POA: Diagnosis not present

## 2016-08-13 DIAGNOSIS — I481 Persistent atrial fibrillation: Secondary | ICD-10-CM | POA: Diagnosis not present

## 2016-08-13 DIAGNOSIS — I5043 Acute on chronic combined systolic (congestive) and diastolic (congestive) heart failure: Secondary | ICD-10-CM

## 2016-08-13 DIAGNOSIS — Z7901 Long term (current) use of anticoagulants: Secondary | ICD-10-CM

## 2016-08-13 DIAGNOSIS — I4819 Other persistent atrial fibrillation: Secondary | ICD-10-CM

## 2016-08-13 DIAGNOSIS — I441 Atrioventricular block, second degree: Secondary | ICD-10-CM | POA: Diagnosis not present

## 2016-08-13 DIAGNOSIS — Z79899 Other long term (current) drug therapy: Secondary | ICD-10-CM

## 2016-08-13 NOTE — Patient Instructions (Signed)
Dr Royann Shivers has recommended that you have a Cardioversion (DCCV). Electrical Cardioversion uses a jolt of electricity to your heart either through paddles or wired patches attached to your chest. This is a controlled, usually prescheduled, procedure. Defibrillation is done under light anesthesia in the hospital, and you usually go home the day of the procedure. This is done to get your heart back into a normal rhythm. You are not awake for the procedure. Please see the instruction sheet given to you today.

## 2016-08-13 NOTE — Progress Notes (Signed)
Patient ID: Kathleen [name], female   DOB: 1924-05-22, 83 y.o.   MRN: [id number] Patient ID: Kathleen [name], female   DOB: Mar 08, 1924, 83 y.o.   MRN: [id number]    Cardiology Office Note    Date:  08/13/2016   ID:  Kathleen [name], DOB June 27, 1924, MRN [id number]  PCP:  Elfredia Nevins, MD  Cardiologist:   Thurmon Fair, MD   Chief Complaint  Patient presents with  . Follow-up    History of Present Illness:  Kathleen [name] is a 83 y.o. female with long-standing nonischemic cardiomyopathy, high-grade AV block requiring permanent pacemaker therapy (upgrade to biventricular pacemaker after she developed congestive heart failure, excellent response to CRT), paroxysmal atrial fibrillation on chronic amiodarone and Eliquis therapy, hyperlipidemia, in for Persistent atrial fibrillation detected by her pacemaker.   She has had problems with refractory ankle edema for at least a month. In part this is because of her reluctance to take loop diuretics on any day that she has plans to leave the house, since she has had frequent accidents. Her device thoracic impedance monitor suggests fluid accumulation began in early June, exceeding threshold in mid to late June. She has been in persistent uninterrupted atrial fibrillation since June 25. Has complete heart block and therefore still has virtually 100% biventricular pacing. She denies orthopnea or PND and is not aware of any palpitations.  She has not had any recent falls or any bleeding problems. There has been no interruption in anticoagulation during the last month. In January 2017 she fell and suffered a pelvic fracture and a right wrist fracture. In November she was trying to clean a ceiling fan, fell and fractured her hip. He has managed to avoid further injuries or need for hospitalization since that time.  Overall still has NYHA functional class II shortness of breath.  Interrogation of her pacemaker shows 99.7 % biventricular pacing. He  also had nearly 100% atrial pacing before atrial fibrillation started on June 25.Marland Kitchen Her heart rate histogram is quite flat, reflecting her sedentary status. Her activity level has increased back up to about 1.3 hour a day. The device has not recorded ventricular tachycardia. Her thoracic impedance (optivol) has been abnormal since mid June. Generator longevity 2.5 years.    Past Medical History:  Diagnosis Date  . Acid reflux   . 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 CAD (coronary artery disease)   . Chronic kidney disease (CKD), stage III (moderate)   . Essential hypertension   . History of pneumonia   . Hyperlipidemia   . Macular degeneration   . Nonischemic cardiomyopathy (HCC)   . PAF (paroxysmal atrial fibrillation) (HCC)   . Type 2 diabetes mellitus (HCC)     Past Surgical History:  Procedure Laterality Date  . Bladder tack  1970's  . CARDIAC CATHETERIZATION  10/15/2011   Normal coronaries  . CARDIOVERSION N/A 10/28/2013   Procedure: CARDIOVERSION;  Surgeon: Thurmon Fair, MD;  Location: [facility];  Service: Cardiovascular;  Laterality: N/A;  . CHOLECYSTECTOMY  1999  . INTRAMEDULLARY (IM) NAIL INTERTROCHANTERIC Right 11/03/2015   Procedure: INTRAMEDULLARY (IM) NAIL RIGHT INTERTROCHANTERIC HIP FRACTURE;  Surgeon: Tarry Kos, MD;  Location: [facility] OR;  Service: Orthopedics;  Laterality: Right;  . LEFT AND RIGHT HEART CATHETERIZATION WITH CORONARY ANGIOGRAM N/A 10/15/2011   Procedure: LEFT AND RIGHT HEART CATHETERIZATION WITH CORONARY ANGIOGRAM;  Surgeon: Chrystie Nose, MD;  Location: [facility] CATH LAB;  Service: Cardiovascular;  Laterality: N/A;  .  NM MYOCAR PERF WALL MOTION  03/15/2009   Normal  . PACEMAKER INSERTION  10/16/2011   Medtronic  . PERMANENT PACEMAKER INSERTION N/A 03/24/2011   Procedure: PERMANENT PACEMAKER INSERTION;  Surgeon: Thurmon Fair, MD;  Location: [facility] CATH LAB;  Service: Cardiovascular;  Laterality: N/A;  . REPLACEMENT TOTAL KNEE       Current Medications: Outpatient Medications Prior to Visit  Medication Sig Dispense Refill  . acetaminophen (TYLENOL) 325 MG tablet Take 650 mg by mouth every 4 (four) hours as needed.    Marland Kitchen amiodarone (PACERONE) 200 MG tablet Take 1 tablet (200 mg total) by mouth daily. 90 tablet 3  . Coenzyme Q10 (CO Q 10 PO) Take 1 tablet by mouth daily.    Marland Kitchen Dextromethorphan-Guaifenesin (ROBITUSSIN COUGH+CHEST CONG DM) 5-100 MG/5ML LIQD Give 15 ml by mouth every 4 hours prn    . ELIQUIS 2.5 MG TABS tablet TAKE ONE TABLET BY MOUTH TWICE DAILY. 60 tablet 5  . feeding supplement, ENSURE ENLIVE, (ENSURE ENLIVE) LIQD Take 237 mLs by mouth 2 (two) times daily between meals. 237 mL 12  . ferrous sulfate 325 (65 FE) MG tablet Take 325 mg by mouth daily with breakfast.     . furosemide (LASIX) 40 MG tablet TAKE ONE TABLET BY MOUTH DAILY. 30 tablet 6  . glimepiride (AMARYL) 1 MG tablet Take 0.5 tablet (0.5 mg total) by mouth daily if CBG is >120.    Marland Kitchen lubiprostone (AMITIZA) 8 MCG capsule Take 8 mcg by mouth 2 (two) times daily with a meal.    . metoprolol succinate (TOPROL-XL) 25 MG 24 hr tablet TAKE 3 TABLETS BY MOUTH DAILY. 90 tablet 1  . Multiple Vitamins-Minerals (CENTRUM SILVER 50+WOMEN PO) Take 1 tablet by mouth daily.    . Multiple Vitamins-Minerals (ICAPS AREDS 2) CAPS Take 1 capsule by mouth twice daily    . nitroGLYCERIN (NITROSTAT) 0.4 MG SL tablet DISSOLVE 1 TABLET UNDER TONGUE EVERY 5 MINUTES UP TO 15 MIN FOR CHESTPAIN. IF NO RELIEF CALL 911. 25 tablet 4  . ondansetron (ZOFRAN) 4 MG tablet Take 4 mg by mouth every 4 (four) hours as needed for nausea or vomiting.    . polyethylene glycol (MIRALAX / GLYCOLAX) packet Take 17 g by mouth daily as needed.    . pravastatin (PRAVACHOL) 40 MG tablet TAKE 1 TABLET BY MOUTH EACH EVENING. 30 tablet 6   No facility-administered medications prior to visit.      Allergies:   Coumadin [warfarin sodium]; Meloxicam; Metformin and related; Morphine; Nabumetone;  Oxycodone; and Sulfonamide derivatives   Social History   Social History  . Marital status: Widowed    Spouse name: N/A  . Number of children: N/A  . Years of education: N/A   Occupational History  . retired    Social History Main Topics  . Smoking status: Never Smoker  . Smokeless tobacco: Never Used  . Alcohol use No  . Drug use: No  . Sexual activity: Not Currently   Other Topics Concern  . None   Social History Narrative  . None     Family History:  The patient's family history includes Heart attack in her brother; Heart murmur in her daughter; Suicidality in her mother.   ROS:   Please see the history of present illness.    ROS All other systems reviewed and are negative.   PHYSICAL EXAM:   VS:  BP (!) 146/84   Pulse 73   Ht 5\' 6"  (1.676 m)   Wt  136 lb 9.6 oz (62 kg)   BMI 22.05 kg/m    GEN: Well nourished, well developed, in no acute distress General: Alert, oriented x3, no distress Head: no evidence of trauma, PERRL, EOMI, no exophtalmos or lid lag, no myxedema, no xanthelasma; normal ears, nose and oropharynx Neck: 6-8 centimeters elevation in jugular venous pulsations and prompt hepatojugular reflux; brisk carotid pulses without delay and no carotid bruits Chest: clear to auscultation, no signs of consolidation by percussion or palpation, normal fremitus, symmetrical and full respiratory excursions Cardiovascular: normal position and quality of the apical impulse, regular rhythm, normal first and Paradoxically split second heart sounds, no murmurs, rubs or gallops, 3+ bilateral edema Abdomen: no tenderness or distention, no masses by palpation, no abnormal pulsatility or arterial bruits, normal bowel sounds, no hepatosplenomegaly Extremities: no clubbing, cyanosis, Symmetrical 3+ edema of the feet and ankles almost to the knees bilaterally; 2+ radial, ulnar and brachial pulses bilaterally; 2+ right femoral, posterior tibial and dorsalis pedis pulses; 2+ left  femoral, posterior tibial and dorsalis pedis pulses; no subclavian or femoral bruits Neurological: grossly nonfocal Psych: euthymic mood, full affect  Wt Readings from Last 3 Encounters:  08/13/16 136 lb 9.6 oz (62 kg)  04/08/16 133 lb 3.2 oz (60.4 kg)  03/12/16 139 lb (63 kg)      Studies/Labs Reviewed:   EKG:  EKG is ordered today.  It shows background atrial fibrillation with 100% biventricular paced rhythm with positive R waves in leads V1 and V2, QTC 546 ms (paced QRS 158 ms) Recent Labs: 03/12/2016: ALT 11; BUN 13; Creat 1.16; Hemoglobin 12.7; Platelets 234; Potassium 4.0; Sodium 137; TSH 3.54   Lipid Panel    Component Value Date/Time   CHOL 151 04/11/2014 0820   TRIG 108 04/11/2014 0820   HDL 60 04/11/2014 0820   CHOLHDL 2.5 04/11/2014 0820   VLDL 22 04/11/2014 0820   LDLCALC 69 04/11/2014 0820      ASSESSMENT:    1. Acute on chronic combined systolic (congestive) and diastolic (congestive) heart failure (HCC)   2. Cardiomyopathy, dilated, nonischemic (HCC)   3. Persistent atrial fibrillation (HCC)   4. Symptomatic advanced heart block, 2:1 AV block, RT BBB, Lt post. fas. block.(March 2013)   5. CRT - pacemaker Medtronic   6. Long term current use of anticoagulant   7. Encounter for monitoring amiodarone therapy      PLAN:  In order of problems listed above:  1. CHF: She appears Moderately hypervolemic, even more edematous than at her previous appointment, despite compression stockings.. Think we need to recalibrate her "dry weight". Not clear if heart failure exacerbation is the cause or effect of the atrial fibrillation, but she will need both enhanced diuresis and conversion to normal rhythm to improve, I think. Echo performed April 2017 shows an ejection fraction of 40-45 % and showed evidence of restrictive filling consistent with severely elevated filling pressures. She has a severely dilated left atrium and moderate mitral insufficiency, likely secondary to  the cardiomyopathy and volume overload. If she does not improve: Cardioversion and enhanced diuretic therapy, plan a repeat echocardiogram. 2. CMP: Nonischemic 3. AFib: She has had no interruption in her anticoagulation. She may improve with cardioversion, as was the case in the past. Schedule this procedure next week. This procedure has been fully reviewed with the patient and informed consent has been obtained. She has been on a higher dose of amiodarone recently, in hopes of maintenance of normal rhythm after the shock. CHADSVasc 5 (  age 32, gender, HF, HTN).  4. High grade AV block: Over the years and with added treatment with amiodarone, she has progressed to essentially complete heart block. She should be considered pacemaker dependent. 5. CRT-P: Normal function of her dual-chamber biventricular pacemaker. All lead parameters are in normal range. Thoracic impedance is abnormal for well over a month now. He still has excellent by the pacing percentage, but decompensation of heart failure may be related to loss of "atrial kick".  6. Anticoagulation, adjusted for age and small body size.  7. Amiodarone: On long-term amiodarone. Electrolytes, liver and thyroid function tests to be rechecked before her cardioversion   Medication Adjustments/Labs and Tests Ordered: Current medicines are reviewed at length with the patient today.  Concerns regarding medicines are outlined above.  Medication changes, Labs and Tests ordered today are listed in the Patient Instructions below. Patient Instructions  Dr Royann Shivers has recommended that you have a Cardioversion (DCCV). Electrical Cardioversion uses a jolt of electricity to your heart either through paddles or wired patches attached to your chest. This is a controlled, usually prescheduled, procedure. Defibrillation is done under light anesthesia in the hospital, and you usually go home the day of the procedure. This is done to get your heart back into a normal  rhythm. You are not awake for the procedure. Please see the instruction sheet given to you today.     Signed, Thurmon Fair, MD  08/13/2016 12:38 PM    [facility] [address] [place], [place], [place]  [address] Phone: [phone]; Fax: [phone]

## 2016-08-14 LAB — MAGNESIUM: MAGNESIUM: 2.1 mg/dL (ref 1.6–2.3)

## 2016-08-14 LAB — CBC
Hematocrit: 39 % (ref 34.0–46.6)
Hemoglobin: 13.1 g/dL (ref 11.1–15.9)
MCH: 33.8 pg — ABNORMAL HIGH (ref 26.6–33.0)
MCHC: 33.6 g/dL (ref 31.5–35.7)
MCV: 101 fL — ABNORMAL HIGH (ref 79–97)
PLATELETS: 248 10*3/uL (ref 150–379)
RBC: 3.88 x10E6/uL (ref 3.77–5.28)
RDW: 14.1 % (ref 12.3–15.4)
WBC: 7.8 10*3/uL (ref 3.4–10.8)

## 2016-08-14 LAB — PROTIME-INR
INR: 1.4 — AB (ref 0.8–1.2)
Prothrombin Time: 14 s — ABNORMAL HIGH (ref 9.1–12.0)

## 2016-08-14 LAB — TSH: TSH: 4.66 u[IU]/mL — ABNORMAL HIGH (ref 0.450–4.500)

## 2016-08-14 LAB — COMPREHENSIVE METABOLIC PANEL
ALK PHOS: 146 IU/L — AB (ref 39–117)
ALT: 16 IU/L (ref 0–32)
AST: 30 IU/L (ref 0–40)
Albumin/Globulin Ratio: 1.7 (ref 1.2–2.2)
Albumin: 4.2 g/dL (ref 3.5–4.7)
BILIRUBIN TOTAL: 0.9 mg/dL (ref 0.0–1.2)
BUN/Creatinine Ratio: 11 — ABNORMAL LOW (ref 12–28)
BUN: 14 mg/dL (ref 8–27)
CHLORIDE: 95 mmol/L — AB (ref 96–106)
CO2: 23 mmol/L (ref 20–29)
CREATININE: 1.31 mg/dL — AB (ref 0.57–1.00)
Calcium: 9.3 mg/dL (ref 8.7–10.3)
GFR calc Af Amer: 42 mL/min/{1.73_m2} — ABNORMAL LOW (ref >59)
GFR calc non Af Amer: 36 mL/min/{1.73_m2} — ABNORMAL LOW (ref >59)
GLOBULIN, TOTAL: 2.5 g/dL (ref 1.5–4.5)
GLUCOSE: 114 mg/dL — AB (ref 65–99)
Potassium: 3.9 mmol/L (ref 3.5–5.2)
SODIUM: 139 mmol/L (ref 134–144)
Total Protein: 6.7 g/dL (ref 6.0–8.5)

## 2016-08-19 ENCOUNTER — Other Ambulatory Visit: Payer: Self-pay

## 2016-08-19 DIAGNOSIS — I4819 Other persistent atrial fibrillation: Secondary | ICD-10-CM

## 2016-08-20 ENCOUNTER — Ambulatory Visit (HOSPITAL_COMMUNITY)
Admission: RE | Admit: 2016-08-20 | Discharge: 2016-08-20 | Disposition: A | Discharge status: Home / Self Care | Payer: PPO | Source: Ambulatory Visit | Attending: Cardiovascular Disease | Admitting: Cardiovascular Disease

## 2016-08-20 ENCOUNTER — Encounter (HOSPITAL_COMMUNITY)
Admission: RE | Disposition: A | Discharge status: Home / Self Care | Payer: Self-pay | Source: Ambulatory Visit | Attending: Cardiovascular Disease

## 2016-08-20 ENCOUNTER — Ambulatory Visit (HOSPITAL_COMMUNITY): Payer: PPO | Admitting: Certified Registered Nurse Anesthetist

## 2016-08-20 ENCOUNTER — Encounter (HOSPITAL_COMMUNITY): Payer: Self-pay | Admitting: *Deleted

## 2016-08-20 DIAGNOSIS — Z885 Allergy status to narcotic agent status: Secondary | ICD-10-CM | POA: Insufficient documentation

## 2016-08-20 DIAGNOSIS — H3530 Unspecified macular degeneration: Secondary | ICD-10-CM | POA: Diagnosis not present

## 2016-08-20 DIAGNOSIS — I442 Atrioventricular block, complete: Secondary | ICD-10-CM | POA: Diagnosis not present

## 2016-08-20 DIAGNOSIS — Z951 Presence of aortocoronary bypass graft: Secondary | ICD-10-CM | POA: Insufficient documentation

## 2016-08-20 DIAGNOSIS — Z950 Presence of cardiac pacemaker: Secondary | ICD-10-CM | POA: Insufficient documentation

## 2016-08-20 DIAGNOSIS — I2510 Atherosclerotic heart disease of native coronary artery without angina pectoris: Secondary | ICD-10-CM | POA: Insufficient documentation

## 2016-08-20 DIAGNOSIS — K219 Gastro-esophageal reflux disease without esophagitis: Secondary | ICD-10-CM | POA: Diagnosis not present

## 2016-08-20 DIAGNOSIS — I5042 Chronic combined systolic (congestive) and diastolic (congestive) heart failure: Secondary | ICD-10-CM | POA: Diagnosis not present

## 2016-08-20 DIAGNOSIS — I5022 Chronic systolic (congestive) heart failure: Secondary | ICD-10-CM | POA: Diagnosis not present

## 2016-08-20 DIAGNOSIS — Z7984 Long term (current) use of oral hypoglycemic drugs: Secondary | ICD-10-CM | POA: Insufficient documentation

## 2016-08-20 DIAGNOSIS — I083 Combined rheumatic disorders of mitral, aortic and tricuspid valves: Secondary | ICD-10-CM | POA: Diagnosis not present

## 2016-08-20 DIAGNOSIS — Z79899 Other long term (current) drug therapy: Secondary | ICD-10-CM | POA: Diagnosis not present

## 2016-08-20 DIAGNOSIS — Z882 Allergy status to sulfonamides status: Secondary | ICD-10-CM | POA: Insufficient documentation

## 2016-08-20 DIAGNOSIS — E785 Hyperlipidemia, unspecified: Secondary | ICD-10-CM | POA: Diagnosis not present

## 2016-08-20 DIAGNOSIS — I4819 Other persistent atrial fibrillation: Secondary | ICD-10-CM

## 2016-08-20 DIAGNOSIS — I420 Dilated cardiomyopathy: Secondary | ICD-10-CM | POA: Diagnosis not present

## 2016-08-20 DIAGNOSIS — Z7901 Long term (current) use of anticoagulants: Secondary | ICD-10-CM | POA: Diagnosis not present

## 2016-08-20 DIAGNOSIS — N183 Chronic kidney disease, stage 3 (moderate): Secondary | ICD-10-CM | POA: Diagnosis not present

## 2016-08-20 DIAGNOSIS — I481 Persistent atrial fibrillation: Secondary | ICD-10-CM | POA: Insufficient documentation

## 2016-08-20 DIAGNOSIS — I130 Hypertensive heart and chronic kidney disease with heart failure and stage 1 through stage 4 chronic kidney disease, or unspecified chronic kidney disease: Secondary | ICD-10-CM | POA: Diagnosis not present

## 2016-08-20 DIAGNOSIS — J449 Chronic obstructive pulmonary disease, unspecified: Secondary | ICD-10-CM | POA: Insufficient documentation

## 2016-08-20 DIAGNOSIS — E1122 Type 2 diabetes mellitus with diabetic chronic kidney disease: Secondary | ICD-10-CM | POA: Diagnosis not present

## 2016-08-20 DIAGNOSIS — Z888 Allergy status to other drugs, medicaments and biological substances status: Secondary | ICD-10-CM | POA: Insufficient documentation

## 2016-08-20 DIAGNOSIS — I480 Paroxysmal atrial fibrillation: Secondary | ICD-10-CM | POA: Insufficient documentation

## 2016-08-20 DIAGNOSIS — I4891 Unspecified atrial fibrillation: Secondary | ICD-10-CM | POA: Diagnosis not present

## 2016-08-20 HISTORY — PX: CARDIOVERSION: SHX1299

## 2016-08-20 SURGERY — CARDIOVERSION
Anesthesia: General

## 2016-08-20 MED ORDER — SODIUM CHLORIDE 0.9 % IV SOLN
INTRAVENOUS | Status: DC
  Administered 2016-08-20: 11:00:00 via INTRAVENOUS

## 2016-08-20 MED ORDER — PROPOFOL 10 MG/ML IV BOLUS
INTRAVENOUS | Status: DC | PRN
  Administered 2016-08-20: 10 mg via INTRAVENOUS
  Administered 2016-08-20: 30 mg via INTRAVENOUS

## 2016-08-20 MED ORDER — SODIUM CHLORIDE 0.9% FLUSH: 3.0000 mL | INTRAVENOUS | Status: DC | PRN

## 2016-08-20 MED ORDER — HYDROCORTISONE 1 % EX CREA: 1.0000 "application " | TOPICAL_CREAM | Freq: Three times a day (TID) | CUTANEOUS | Status: DC | PRN

## 2016-08-20 MED ORDER — LIDOCAINE 2% (20 MG/ML) 5 ML SYRINGE
INTRAMUSCULAR | Status: DC | PRN
  Administered 2016-08-20: 60 mg via INTRAVENOUS

## 2016-08-20 NOTE — H&P (View-Only) (Signed)
Patient ID: Kathleen [name], female   DOB: 1924-05-22, 83 y.o.   MRN: [id number] Patient ID: Kathleen [name], female   DOB: Mar 08, 1924, 83 y.o.   MRN: [id number]    Cardiology Office Note    Date:  08/13/2016   ID:  Kathleen [name], DOB June 27, 1924, MRN [id number]  PCP:  Elfredia Nevins, MD  Cardiologist:   Thurmon Fair, MD   Chief Complaint  Patient presents with  . Follow-up    History of Present Illness:  Kathleen [name] is a 83 y.o. female with long-standing nonischemic cardiomyopathy, high-grade AV block requiring permanent pacemaker therapy (upgrade to biventricular pacemaker after she developed congestive heart failure, excellent response to CRT), paroxysmal atrial fibrillation on chronic amiodarone and Eliquis therapy, hyperlipidemia, in for Persistent atrial fibrillation detected by her pacemaker.   She has had problems with refractory ankle edema for at least a month. In part this is because of her reluctance to take loop diuretics on any day that she has plans to leave the house, since she has had frequent accidents. Her device thoracic impedance monitor suggests fluid accumulation began in early June, exceeding threshold in mid to late June. She has been in persistent uninterrupted atrial fibrillation since June 25. Has complete heart block and therefore still has virtually 100% biventricular pacing. She denies orthopnea or PND and is not aware of any palpitations.  She has not had any recent falls or any bleeding problems. There has been no interruption in anticoagulation during the last month. In January 2017 she fell and suffered a pelvic fracture and a right wrist fracture. In November she was trying to clean a ceiling fan, fell and fractured her hip. He has managed to avoid further injuries or need for hospitalization since that time.  Overall still has NYHA functional class II shortness of breath.  Interrogation of her pacemaker shows 99.7 % biventricular pacing. He  also had nearly 100% atrial pacing before atrial fibrillation started on June 25.Marland Kitchen Her heart rate histogram is quite flat, reflecting her sedentary status. Her activity level has increased back up to about 1.3 hour a day. The device has not recorded ventricular tachycardia. Her thoracic impedance (optivol) has been abnormal since mid June. Generator longevity 2.5 years.    Past Medical History:  Diagnosis Date  . Acid reflux   . 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 CAD (coronary artery disease)   . Chronic kidney disease (CKD), stage III (moderate)   . Essential hypertension   . History of pneumonia   . Hyperlipidemia   . Macular degeneration   . Nonischemic cardiomyopathy (HCC)   . PAF (paroxysmal atrial fibrillation) (HCC)   . Type 2 diabetes mellitus (HCC)     Past Surgical History:  Procedure Laterality Date  . Bladder tack  1970's  . CARDIAC CATHETERIZATION  10/15/2011   Normal coronaries  . CARDIOVERSION N/A 10/28/2013   Procedure: CARDIOVERSION;  Surgeon: Thurmon Fair, MD;  Location: [facility]OS[facility];  Service: Cardiovascular;  Laterality: N/A;  . CHOLECYSTECTOMY  1999  . INTRAMEDULLARY (IM) NAIL INTERTROCHANTERIC Right 11/03/2015   Procedure: INTRAMEDULLARY (IM) NAIL RIGHT INTERTROCHANTERIC HIP FRACTURE;  Surgeon: Tarry Kos, MD;  Location: [facility] OR;  Service: Orthopedics;  Laterality: Right;  . LEFT AND RIGHT HEART CATHETERIZATION WITH CORONARY ANGIOGRAM N/A 10/15/2011   Procedure: LEFT AND RIGHT HEART CATHETERIZATION WITH CORONARY ANGIOGRAM;  Surgeon: Chrystie Nose, MD;  Location: [facility] CATH LAB;  Service: Cardiovascular;  Laterality: N/A;  .  NM MYOCAR PERF WALL MOTION  03/15/2009   Normal  . PACEMAKER INSERTION  10/16/2011   Medtronic  . PERMANENT PACEMAKER INSERTION N/A 03/24/2011   Procedure: PERMANENT PACEMAKER INSERTION;  Surgeon: Thurmon Fair, MD;  Location: [facility] CATH LAB;  Service: Cardiovascular;  Laterality: N/A;  . REPLACEMENT TOTAL KNEE       Current Medications: Outpatient Medications Prior to Visit  Medication Sig Dispense Refill  . acetaminophen (TYLENOL) 325 MG tablet Take 650 mg by mouth every 4 (four) hours as needed.    Marland Kitchen amiodarone (PACERONE) 200 MG tablet Take 1 tablet (200 mg total) by mouth daily. 90 tablet 3  . Coenzyme Q10 (CO Q 10 PO) Take 1 tablet by mouth daily.    Marland Kitchen Dextromethorphan-Guaifenesin (ROBITUSSIN COUGH+CHEST CONG DM) 5-100 MG/5ML LIQD Give 15 ml by mouth every 4 hours prn    . ELIQUIS 2.5 MG TABS tablet TAKE ONE TABLET BY MOUTH TWICE DAILY. 60 tablet 5  . feeding supplement, ENSURE ENLIVE, (ENSURE ENLIVE) LIQD Take 237 mLs by mouth 2 (two) times daily between meals. 237 mL 12  . ferrous sulfate 325 (65 FE) MG tablet Take 325 mg by mouth daily with breakfast.     . furosemide (LASIX) 40 MG tablet TAKE ONE TABLET BY MOUTH DAILY. 30 tablet 6  . glimepiride (AMARYL) 1 MG tablet Take 0.5 tablet (0.5 mg total) by mouth daily if CBG is >120.    Marland Kitchen lubiprostone (AMITIZA) 8 MCG capsule Take 8 mcg by mouth 2 (two) times daily with a meal.    . metoprolol succinate (TOPROL-XL) 25 MG 24 hr tablet TAKE 3 TABLETS BY MOUTH DAILY. 90 tablet 1  . Multiple Vitamins-Minerals (CENTRUM SILVER 50+WOMEN PO) Take 1 tablet by mouth daily.    . Multiple Vitamins-Minerals (ICAPS AREDS 2) CAPS Take 1 capsule by mouth twice daily    . nitroGLYCERIN (NITROSTAT) 0.4 MG SL tablet DISSOLVE 1 TABLET UNDER TONGUE EVERY 5 MINUTES UP TO 15 MIN FOR CHESTPAIN. IF NO RELIEF CALL 911. 25 tablet 4  . ondansetron (ZOFRAN) 4 MG tablet Take 4 mg by mouth every 4 (four) hours as needed for nausea or vomiting.    . polyethylene glycol (MIRALAX / GLYCOLAX) packet Take 17 g by mouth daily as needed.    . pravastatin (PRAVACHOL) 40 MG tablet TAKE 1 TABLET BY MOUTH EACH EVENING. 30 tablet 6   No facility-administered medications prior to visit.      Allergies:   Coumadin [warfarin sodium]; Meloxicam; Metformin and related; Morphine; Nabumetone;  Oxycodone; and Sulfonamide derivatives   Social History   Social History  . Marital status: Widowed    Spouse name: N/A  . Number of children: N/A  . Years of education: N/A   Occupational History  . retired    Social History Main Topics  . Smoking status: Never Smoker  . Smokeless tobacco: Never Used  . Alcohol use No  . Drug use: No  . Sexual activity: Not Currently   Other Topics Concern  . None   Social History Narrative  . None     Family History:  The patient's family history includes Heart attack in her brother; Heart murmur in her daughter; Suicidality in her mother.   ROS:   Please see the history of present illness.    ROS All other systems reviewed and are negative.   PHYSICAL EXAM:   VS:  BP (!) 146/84   Pulse 73   Ht 5\' 6"  (1.676 m)   Wt  136 lb 9.6 oz (62 kg)   BMI 22.05 kg/m    GEN: Well nourished, well developed, in no acute distress General: Alert, oriented x3, no distress Head: no evidence of trauma, PERRL, EOMI, no exophtalmos or lid lag, no myxedema, no xanthelasma; normal ears, nose and oropharynx Neck: 6-8 centimeters elevation in jugular venous pulsations and prompt hepatojugular reflux; brisk carotid pulses without delay and no carotid bruits Chest: clear to auscultation, no signs of consolidation by percussion or palpation, normal fremitus, symmetrical and full respiratory excursions Cardiovascular: normal position and quality of the apical impulse, regular rhythm, normal first and Paradoxically split second heart sounds, no murmurs, rubs or gallops, 3+ bilateral edema Abdomen: no tenderness or distention, no masses by palpation, no abnormal pulsatility or arterial bruits, normal bowel sounds, no hepatosplenomegaly Extremities: no clubbing, cyanosis, Symmetrical 3+ edema of the feet and ankles almost to the knees bilaterally; 2+ radial, ulnar and brachial pulses bilaterally; 2+ right femoral, posterior tibial and dorsalis pedis pulses; 2+ left  femoral, posterior tibial and dorsalis pedis pulses; no subclavian or femoral bruits Neurological: grossly nonfocal Psych: euthymic mood, full affect  Wt Readings from Last 3 Encounters:  08/13/16 136 lb 9.6 oz (62 kg)  04/08/16 133 lb 3.2 oz (60.4 kg)  03/12/16 139 lb (63 kg)      Studies/Labs Reviewed:   EKG:  EKG is ordered today.  It shows background atrial fibrillation with 100% biventricular paced rhythm with positive R waves in leads V1 and V2, QTC 546 ms (paced QRS 158 ms) Recent Labs: 03/12/2016: ALT 11; BUN 13; Creat 1.16; Hemoglobin 12.7; Platelets 234; Potassium 4.0; Sodium 137; TSH 3.54   Lipid Panel    Component Value Date/Time   CHOL 151 04/11/2014 0820   TRIG 108 04/11/2014 0820   HDL 60 04/11/2014 0820   CHOLHDL 2.5 04/11/2014 0820   VLDL 22 04/11/2014 0820   LDLCALC 69 04/11/2014 0820      ASSESSMENT:    1. Acute on chronic combined systolic (congestive) and diastolic (congestive) heart failure (HCC)   2. Cardiomyopathy, dilated, nonischemic (HCC)   3. Persistent atrial fibrillation (HCC)   4. Symptomatic advanced heart block, 2:1 AV block, RT BBB, Lt post. fas. block.(March 2013)   5. CRT - pacemaker Medtronic   6. Long term current use of anticoagulant   7. Encounter for monitoring amiodarone therapy      PLAN:  In order of problems listed above:  1. CHF: She appears Moderately hypervolemic, even more edematous than at her previous appointment, despite compression stockings.. Think we need to recalibrate her "dry weight". Not clear if heart failure exacerbation is the cause or effect of the atrial fibrillation, but she will need both enhanced diuresis and conversion to normal rhythm to improve, I think. Echo performed April 2017 shows an ejection fraction of 40-45 % and showed evidence of restrictive filling consistent with severely elevated filling pressures. She has a severely dilated left atrium and moderate mitral insufficiency, likely secondary to  the cardiomyopathy and volume overload. If she does not improve: Cardioversion and enhanced diuretic therapy, plan a repeat echocardiogram. 2. CMP: Nonischemic 3. AFib: She has had no interruption in her anticoagulation. She may improve with cardioversion, as was the case in the past. Schedule this procedure next week. This procedure has been fully reviewed with the patient and informed consent has been obtained. She has been on a higher dose of amiodarone recently, in hopes of maintenance of normal rhythm after the shock. CHADSVasc 5 (  age 32, gender, HF, HTN).  4. High grade AV block: Over the years and with added treatment with amiodarone, she has progressed to essentially complete heart block. She should be considered pacemaker dependent. 5. CRT-P: Normal function of her dual-chamber biventricular pacemaker. All lead parameters are in normal range. Thoracic impedance is abnormal for well over a month now. He still has excellent by the pacing percentage, but decompensation of heart failure may be related to loss of "atrial kick".  6. Anticoagulation, adjusted for age and small body size.  7. Amiodarone: On long-term amiodarone. Electrolytes, liver and thyroid function tests to be rechecked before her cardioversion   Medication Adjustments/Labs and Tests Ordered: Current medicines are reviewed at length with the patient today.  Concerns regarding medicines are outlined above.  Medication changes, Labs and Tests ordered today are listed in the Patient Instructions below. Patient Instructions  Dr Royann Shivers has recommended that you have a Cardioversion (DCCV). Electrical Cardioversion uses a jolt of electricity to your heart either through paddles or wired patches attached to your chest. This is a controlled, usually prescheduled, procedure. Defibrillation is done under light anesthesia in the hospital, and you usually go home the day of the procedure. This is done to get your heart back into a normal  rhythm. You are not awake for the procedure. Please see the instruction sheet given to you today.     Signed, Thurmon Fair, MD  08/13/2016 12:38 PM    [facility] [address] [place], [place], [place]  [address] Phone: [phone]; Fax: [phone]

## 2016-08-20 NOTE — Op Note (Signed)
Procedure: Electrical Cardioversion Indications:  Atrial Fibrillation  Procedure Details:  Consent: Risks of procedure as well as the alternatives and risks of each were explained to the (patient/caregiver).  Consent for procedure obtained.  Time Out: Verified patient identification, verified procedure, site/side was marked, verified correct patient position, special equipment/implants available, medications/allergies/relevent history reviewed, required imaging and test results available.  Performed  Patient placed on cardiac monitor, pulse oximetry, supplemental oxygen as necessary.  Sedation given: propofol 30 mg IV Anesthesiology Pacer pads placed anterior and posterior chest.  Cardioverted 1 time(s).  Cardioversion with synchronized biphasic 120J shock.  Evaluation: Findings: Post procedure EKG shows: A paced V paced. Normal device check after CV. Complications: None Patient did tolerate procedure well.  Time Spent Directly with the Patient:  30 minutes   Marcia Lepera 08/20/2016, 11:28 AM

## 2016-08-20 NOTE — Anesthesia Preprocedure Evaluation (Addendum)
Anesthesia Evaluation  Patient identified by MRN, date of birth, ID band Patient awake    Reviewed: Allergy & Precautions, NPO status , Patient's Chart, lab work & pertinent test results, reviewed documented beta blocker date and time   Airway Mallampati: III  TM Distance: >3 FB Neck ROM: Full    Dental  (+) Teeth Intact, Partial Upper   Pulmonary COPD,    Pulmonary exam normal        Cardiovascular hypertension, Pt. on home beta blockers + CAD, + CABG and +CHF  Normal cardiovascular exam+ dysrhythmias Atrial Fibrillation + pacemaker   ECG: Rate 73  ECHO: Mild LVH with LVEF approximately 40-45%. There is akinesis of the inferolateral wall. Restrictive diastolic filling pattern with increased LV filling pressure. Severe left atrial enlargement. MAC with moderate mitral regurgitation. Mildly sclerotic aortic valve. Device wire noted within the right heart. Mild tricuspid regurgitation with PASP 49 mmHg.   Neuro/Psych    GI/Hepatic GERD  ,  Endo/Other  diabetes, Type 2, Oral Hypoglycemic Agents  Renal/GU Renal InsufficiencyRenal disease     Musculoskeletal   Abdominal   Peds  Hematology   Anesthesia Other Findings Hyperlipidemia  Reproductive/Obstetrics                            Anesthesia Physical  Anesthesia Plan  ASA: III  Anesthesia Plan: General   Post-op Pain Management:    Induction: Intravenous  PONV Risk Score and Plan: 3 and Propofol infusion  Airway Management Planned: Mask  Additional Equipment:   Intra-op Plan:   Post-operative Plan:   Informed Consent: I have reviewed the patients History and Physical, chart, labs and discussed the procedure including the risks, benefits and alternatives for the proposed anesthesia with the patient or authorized representative who has indicated his/her understanding and acceptance.     Plan Discussed with: CRNA and  Surgeon  Anesthesia Plan Comments:         Anesthesia Quick Evaluation

## 2016-08-20 NOTE — Discharge Instructions (Signed)
Electrical Cardioversion, Care After °This sheet gives you information about how to care for yourself after your procedure. Your health care provider may also give you more specific instructions. If you have problems or questions, contact your health care provider. °What can I expect after the procedure? °After the procedure, it is common to have: °· Some redness on the skin where the shocks were given. ° °Follow these instructions at home: °· Do not drive for 24 hours if you were given a medicine to help you relax (sedative). °· Take over-the-counter and prescription medicines only as told by your health care provider. °· Ask your health care provider how to check your pulse. Check it often. °· Rest for 48 hours after the procedure or as told by your health care provider. °· Avoid or limit your caffeine use as told by your health care provider. °Contact a health care provider if: °· You feel like your heart is beating too quickly or your pulse is not regular. °· You have a serious muscle cramp that does not go away. °Get help right away if: °· You have discomfort in your chest. °· You are dizzy or you feel faint. °· You have trouble breathing or you are short of breath. °· Your speech is slurred. °· You have trouble moving an arm or leg on one side of your body. °· Your fingers or toes turn cold or blue. °This information is not intended to replace advice given to you by your health care provider. Make sure you discuss any questions you have with your health care provider. °Document Released: 10/20/2012 Document Revised: 08/03/2015 Document Reviewed: 07/06/2015 °Elsevier Interactive Patient Education © 2018 [employer] ° °

## 2016-08-20 NOTE — Interval H&P Note (Signed)
History and Physical Interval Note:  08/20/2016 9:19 AM  Kathleen [name]  has presented today for surgery, with the diagnosis of Persistent atrial fibrillation  The various methods of treatment have been discussed with the patient and family. After consideration of risks, benefits and other options for treatment, the patient has consented to  Procedure(s): CARDIOVERSION (N/A) as a surgical intervention .  The patient's history has been reviewed, patient examined, no change in status, stable for surgery.  I have reviewed the patient's chart and labs.  Questions were answered to the patient's satisfaction.     Shon Indelicato

## 2016-08-20 NOTE — Anesthesia Postprocedure Evaluation (Signed)
Anesthesia Post Note  Patient: Kathleen [name]  Procedure(s) Performed: Procedure(s) (LRB): CARDIOVERSION (N/A)     Patient location during evaluation: PACU Anesthesia Type: General Level of consciousness: awake and alert Pain management: pain level controlled Vital Signs Assessment: post-procedure vital signs reviewed and stable Respiratory status: spontaneous breathing, nonlabored ventilation, respiratory function stable and patient connected to nasal cannula oxygen Cardiovascular status: blood pressure returned to baseline and stable Postop Assessment: no signs of nausea or vomiting Anesthetic complications: no    Last Vitals:  Vitals:   08/20/16 1150 08/20/16 1200  BP: (!) 156/74 (!) 154/72  Pulse: 70 70  Resp: (!) 23 (!) 22  Temp:      Last Pain:  Vitals:   08/20/16 1139  TempSrc: Oral                 [name] P Jake Fuhrmann

## 2016-08-20 NOTE — Transfer of Care (Signed)
Immediate Anesthesia Transfer of Care Note  Patient: Kathleen [name]  Procedure(s) Performed: Procedure(s): CARDIOVERSION (N/A)  Patient Location: Endoscopy Unit  Anesthesia Type:MAC  Level of Consciousness: awake, alert , oriented and patient cooperative  Airway & Oxygen Therapy: Patient Spontanous Breathing  Post-op Assessment: Report given to RN, Post -op Vital signs reviewed and stable and Patient moving all extremities X 4  Post vital signs: Reviewed and stable  Last Vitals:  Vitals:   08/20/16 1129 08/20/16 1130  BP: (!) 143/74 (!) 145/73  Pulse: 74 73  Resp: (!) 8 17  Temp:      Last Pain:  Vitals:   08/20/16 0939  TempSrc: Oral         Complications: No apparent anesthesia complications

## 2016-08-21 ENCOUNTER — Inpatient Hospital Stay (HOSPITAL_COMMUNITY)
Admission: EM | Admit: 2016-08-21 | Discharge: 2016-08-22 | DRG: 291 | Disposition: A | Discharge status: Home / Self Care | Payer: PPO | Attending: Cardiovascular Disease | Admitting: Cardiovascular Disease

## 2016-08-21 ENCOUNTER — Telehealth: Payer: Self-pay | Admitting: Cardiovascular Disease

## 2016-08-21 ENCOUNTER — Emergency Department (HOSPITAL_COMMUNITY): Payer: PPO

## 2016-08-21 ENCOUNTER — Encounter (HOSPITAL_COMMUNITY): Payer: Self-pay | Admitting: Emergency Medicine

## 2016-08-21 DIAGNOSIS — I2510 Atherosclerotic heart disease of native coronary artery without angina pectoris: Secondary | ICD-10-CM | POA: Diagnosis not present

## 2016-08-21 DIAGNOSIS — Z885 Allergy status to narcotic agent status: Secondary | ICD-10-CM | POA: Diagnosis not present

## 2016-08-21 DIAGNOSIS — I481 Persistent atrial fibrillation: Secondary | ICD-10-CM | POA: Diagnosis present

## 2016-08-21 DIAGNOSIS — Z9049 Acquired absence of other specified parts of digestive tract: Secondary | ICD-10-CM

## 2016-08-21 DIAGNOSIS — R0602 Shortness of breath: Secondary | ICD-10-CM | POA: Diagnosis not present

## 2016-08-21 DIAGNOSIS — I5043 Acute on chronic combined systolic (congestive) and diastolic (congestive) heart failure: Secondary | ICD-10-CM | POA: Diagnosis not present

## 2016-08-21 DIAGNOSIS — I420 Dilated cardiomyopathy: Secondary | ICD-10-CM | POA: Diagnosis not present

## 2016-08-21 DIAGNOSIS — Z8249 Family history of ischemic heart disease and other diseases of the circulatory system: Secondary | ICD-10-CM

## 2016-08-21 DIAGNOSIS — I480 Paroxysmal atrial fibrillation: Secondary | ICD-10-CM | POA: Diagnosis present

## 2016-08-21 DIAGNOSIS — I5041 Acute combined systolic (congestive) and diastolic (congestive) heart failure: Secondary | ICD-10-CM | POA: Diagnosis present

## 2016-08-21 DIAGNOSIS — H3530 Unspecified macular degeneration: Secondary | ICD-10-CM | POA: Diagnosis present

## 2016-08-21 DIAGNOSIS — Z888 Allergy status to other drugs, medicaments and biological substances status: Secondary | ICD-10-CM

## 2016-08-21 DIAGNOSIS — E785 Hyperlipidemia, unspecified: Secondary | ICD-10-CM | POA: Diagnosis not present

## 2016-08-21 DIAGNOSIS — Z950 Presence of cardiac pacemaker: Secondary | ICD-10-CM | POA: Diagnosis not present

## 2016-08-21 DIAGNOSIS — Z96659 Presence of unspecified artificial knee joint: Secondary | ICD-10-CM | POA: Diagnosis present

## 2016-08-21 DIAGNOSIS — Z886 Allergy status to analgesic agent status: Secondary | ICD-10-CM

## 2016-08-21 DIAGNOSIS — Z79899 Other long term (current) drug therapy: Secondary | ICD-10-CM

## 2016-08-21 DIAGNOSIS — Z681 Body mass index (BMI) 19 or less, adult: Secondary | ICD-10-CM

## 2016-08-21 DIAGNOSIS — E119 Type 2 diabetes mellitus without complications: Secondary | ICD-10-CM

## 2016-08-21 DIAGNOSIS — Z882 Allergy status to sulfonamides status: Secondary | ICD-10-CM

## 2016-08-21 DIAGNOSIS — Z818 Family history of other mental and behavioral disorders: Secondary | ICD-10-CM

## 2016-08-21 DIAGNOSIS — N183 Chronic kidney disease, stage 3 unspecified: Secondary | ICD-10-CM | POA: Diagnosis present

## 2016-08-21 DIAGNOSIS — I081 Rheumatic disorders of both mitral and tricuspid valves: Secondary | ICD-10-CM | POA: Diagnosis present

## 2016-08-21 DIAGNOSIS — I10 Essential (primary) hypertension: Secondary | ICD-10-CM | POA: Diagnosis present

## 2016-08-21 DIAGNOSIS — I130 Hypertensive heart and chronic kidney disease with heart failure and stage 1 through stage 4 chronic kidney disease, or unspecified chronic kidney disease: Secondary | ICD-10-CM | POA: Diagnosis not present

## 2016-08-21 DIAGNOSIS — E876 Hypokalemia: Secondary | ICD-10-CM | POA: Diagnosis present

## 2016-08-21 DIAGNOSIS — Z7901 Long term (current) use of anticoagulants: Secondary | ICD-10-CM

## 2016-08-21 DIAGNOSIS — E1122 Type 2 diabetes mellitus with diabetic chronic kidney disease: Secondary | ICD-10-CM | POA: Diagnosis present

## 2016-08-21 LAB — BASIC METABOLIC PANEL
ANION GAP: 10 (ref 5–15)
BUN: 13 mg/dL (ref 6–20)
CHLORIDE: 98 mmol/L — AB (ref 101–111)
CO2: 29 mmol/L (ref 22–32)
Calcium: 9.2 mg/dL (ref 8.9–10.3)
Creatinine, Ser: 1.36 mg/dL — ABNORMAL HIGH (ref 0.44–1.00)
GFR calc Af Amer: 39 mL/min — ABNORMAL LOW (ref >60)
GFR, EST NON AFRICAN AMERICAN: 33 mL/min — AB (ref >60)
GLUCOSE: 123 mg/dL — AB (ref 65–99)
POTASSIUM: 3.7 mmol/L (ref 3.5–5.1)
Sodium: 137 mmol/L (ref 135–145)

## 2016-08-21 LAB — CBC
HEMATOCRIT: 37 % (ref 36.0–46.0)
HEMOGLOBIN: 12.1 g/dL (ref 12.0–15.0)
MCH: 33.2 pg (ref 26.0–34.0)
MCHC: 32.7 g/dL (ref 30.0–36.0)
MCV: 101.6 fL — AB (ref 78.0–100.0)
Platelets: 193 10*3/uL (ref 150–400)
RBC: 3.64 MIL/uL — ABNORMAL LOW (ref 3.87–5.11)
RDW: 14.4 % (ref 11.5–15.5)
WBC: 5.8 10*3/uL (ref 4.0–10.5)

## 2016-08-21 LAB — I-STAT TROPONIN, ED: Troponin i, poc: 0.02 ng/mL (ref 0.00–0.08)

## 2016-08-21 LAB — BRAIN NATRIURETIC PEPTIDE: B Natriuretic Peptide: 2323.4 pg/mL — ABNORMAL HIGH (ref 0.0–100.0)

## 2016-08-21 MED ORDER — ADULT MULTIVITAMIN W/MINERALS CH
1.0000 | ORAL_TABLET | Freq: Every day | ORAL | Status: DC
  Administered 2016-08-22: 1 via ORAL
  Filled 2016-08-21 (×2): qty 1

## 2016-08-21 MED ORDER — FUROSEMIDE 10 MG/ML IJ SOLN
60.0000 mg | Freq: Two times a day (BID) | INTRAMUSCULAR | Status: DC
  Administered 2016-08-21 – 2016-08-22 (×2): 60 mg via INTRAVENOUS
  Filled 2016-08-21 (×2): qty 6

## 2016-08-21 MED ORDER — NITROGLYCERIN 0.4 MG SL SUBL: 0.4000 mg | SUBLINGUAL_TABLET | SUBLINGUAL | Status: DC | PRN

## 2016-08-21 MED ORDER — ALBUTEROL SULFATE (2.5 MG/3ML) 0.083% IN NEBU
5.0000 mg | INHALATION_SOLUTION | Freq: Once | RESPIRATORY_TRACT | Status: AC
  Administered 2016-08-21: 5 mg via RESPIRATORY_TRACT

## 2016-08-21 MED ORDER — LUBIPROSTONE 24 MCG PO CAPS
24.0000 ug | ORAL_CAPSULE | Freq: Two times a day (BID) | ORAL | Status: DC
  Administered 2016-08-21 – 2016-08-22 (×2): 24 ug via ORAL
  Filled 2016-08-21 (×2): qty 1

## 2016-08-21 MED ORDER — FERROUS SULFATE 325 (65 FE) MG PO TABS
325.0000 mg | ORAL_TABLET | Freq: Every day | ORAL | Status: DC
  Administered 2016-08-22: 325 mg via ORAL
  Filled 2016-08-21: qty 1

## 2016-08-21 MED ORDER — ACETAMINOPHEN 325 MG PO TABS
650.0000 mg | ORAL_TABLET | ORAL | Status: DC | PRN
  Administered 2016-08-22: 650 mg via ORAL
  Filled 2016-08-21: qty 2

## 2016-08-21 MED ORDER — APIXABAN 2.5 MG PO TABS
2.5000 mg | ORAL_TABLET | Freq: Two times a day (BID) | ORAL | Status: DC
  Administered 2016-08-22: 2.5 mg via ORAL
  Filled 2016-08-21 (×2): qty 1

## 2016-08-21 MED ORDER — INSULIN ASPART 100 UNIT/ML ~~LOC~~ SOLN
0.0000 [IU] | Freq: Three times a day (TID) | SUBCUTANEOUS | Status: DC
Start: 2016-08-22 — End: 2016-08-22
  Administered 2016-08-22: 5 [IU] via SUBCUTANEOUS
  Administered 2016-08-22: 1 [IU] via SUBCUTANEOUS

## 2016-08-21 MED ORDER — ALBUTEROL SULFATE (2.5 MG/3ML) 0.083% IN NEBU
INHALATION_SOLUTION | RESPIRATORY_TRACT | Status: AC
  Filled 2016-08-21: qty 6

## 2016-08-21 MED ORDER — FUROSEMIDE 10 MG/ML IJ SOLN
40.0000 mg | Freq: Once | INTRAMUSCULAR | Status: AC
  Administered 2016-08-21: 40 mg via INTRAVENOUS
  Filled 2016-08-21: qty 4

## 2016-08-21 MED ORDER — PRAVASTATIN SODIUM 40 MG PO TABS: 40.0000 mg | ORAL_TABLET | Freq: Every day | ORAL | Status: DC

## 2016-08-21 MED ORDER — AMIODARONE HCL 200 MG PO TABS
200.0000 mg | ORAL_TABLET | Freq: Every day | ORAL | Status: DC
  Administered 2016-08-21: 200 mg via ORAL
  Filled 2016-08-21: qty 1

## 2016-08-21 MED ORDER — ONDANSETRON HCL 4 MG/2ML IJ SOLN: 4.0000 mg | Freq: Four times a day (QID) | INTRAMUSCULAR | Status: DC | PRN

## 2016-08-21 MED ORDER — METOPROLOL SUCCINATE ER 50 MG PO TB24
75.0000 mg | ORAL_TABLET | Freq: Every day | ORAL | Status: DC
  Administered 2016-08-22: 09:00:00 75 mg via ORAL
  Filled 2016-08-21 (×2): qty 1

## 2016-08-21 MED ORDER — LOSARTAN POTASSIUM 25 MG PO TABS
25.0000 mg | ORAL_TABLET | Freq: Every day | ORAL | Status: DC
  Administered 2016-08-21 – 2016-08-22 (×2): 25 mg via ORAL
  Filled 2016-08-21 (×2): qty 1

## 2016-08-21 NOTE — ED Notes (Signed)
Dinner tray ordered.

## 2016-08-21 NOTE — ED Provider Notes (Signed)
[facility]-EMERGENCY DEPT Provider Note   CSN: [id number] Arrival date & time: 08/21/16  0902     History   Chief Complaint Chief Complaint  Patient presents with  . Shortness of Breath  . Congestive Heart Failure    HPI Kathleen [name] is a 83 y.o. female.  HPI   83 year old female with a history of nonischemic cardiomyopathy, high-grade AV block requiring permanent pacemaker therapy, proximal atrial fibrillation on chronic amiodarone and L Oquist, hyperlipidemia presents today with shortness of breath. Patient underwent cardioversion yesterday. She reports after the procedure she was feeling very well, no significant shortness of breath. She notes later on in the afternoon and early evening she became severely short of breath, even short periods of ambulation with her walker cause significant dyspnea. Patient denies any associated chest pain, reports she's had swelling in her bilateral lower extremities, but notes this is a off and on daily occurrence. Patient notes that she has not been taking her fluid pills as she has been forgetting last dose was 2 days ago. Patient notes even though she wasn't having chest pain today she took 2 nitroglycerin which seemed to briefly improve her symptoms. Prior to my evaluation patient also received a breathing treatment which she reports improvement in her symptoms as well.   Past Medical History:  Diagnosis Date  . Acid reflux   . 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 CAD (coronary artery disease)   . Chronic kidney disease (CKD), stage III (moderate)   . Essential hypertension   . History of pneumonia   . Hyperlipidemia   . Macular degeneration   . Nonischemic cardiomyopathy (HCC)   . PAF (paroxysmal atrial fibrillation) (HCC)   . Type 2 diabetes mellitus [facility])     Patient Active Problem List   Diagnosis Date Noted  . Persistent atrial fibrillation (HCC)   . Encounter for monitoring amiodarone therapy 06/08/2016  .  Right hip pain   . Pressure injury of skin 11/19/2015  . Hematoma of hip, right, initial encounter 11/18/2015  . Abdominal distension   . Malnutrition of moderate degree 11/02/2015  . Closed displaced intertrochanteric fracture of right femur (HCC)   . Hip fracture (HCC) 11/01/2015  . Acute on chronic combined systolic and diastolic CHF, NYHA class 1 (HCC) 05/02/2015  . Pain in the chest   . Chest pain 04/30/2015  . Pelvis fracture, closed, initial encounter 02/19/2015  . Distal radial epiphysitis 02/19/2015  . Hypercortisolemia (HCC) 10/03/2014  . UTI (urinary tract infection) 10/05/2013  . Atrial fibrillation with RVR (HCC) 10/04/2013  . Generalized weakness 10/04/2013  . Chronic combined systolic and diastolic CHF (congestive heart failure) (HCC) 10/20/2012  . CKD (chronic kidney disease) stage 3, GFR 30-59 ml/min 10/20/2012  . Cardiomyopathy, dilated, nonischemic 10/14/2011  . CRT - pacemaker Medtronic 10/14/2011  . Hypokalemia 10/12/2011  . Acute renal insufficiency, Scr was WNL in April 2013 10/11/2011  . Anemia 10/11/2011  . Unstable angina (HCC) 10/11/2011  . Gait abnormality 05/13/2011  . Syncope 03/25/2011  . COPD on CXR 03/25/2011  . Coronary atherosclerosis, no significant obstructive lesions 03/25/2011  . Symptomatic advanced heart block, 2:1 AV block, RT BBB, Lt post. fas. block.(March 2013) 03/23/2011  . Paroxysmal atrial fibrillation (HCC) 03/23/2011  . Lung nodule 12/13/2010  . DM type 2 (diabetes mellitus, type 2) (HCC) 12/13/2010  . Hyperlipidemia 12/13/2010  . TOTAL KNEE FOLLOW-UP 06/20/2009  . MONONEURITIS, LEG 02/19/2009  . KNEE, ARTHRITIS, DEGEN./OSTEO 11/30/2008  .  SPINAL STENOSIS 07/04/2008  . HEMARTHROSIS, LOWER LEG 05/10/2008  . Effusion of lower leg joint 05/03/2008  . KNEE PAIN 05/03/2008  . Hypertension 05/03/2008    Past Surgical History:  Procedure Laterality Date  . Bladder tack  1970's  . CARDIAC CATHETERIZATION  10/15/2011   Normal  coronaries  . CARDIOVERSION N/A 10/28/2013   Procedure: CARDIOVERSION;  Surgeon: Thurmon Fair, MD;  Location: [facility] ENDOSCOPY;  Service: Cardiovascular;  Laterality: N/A;  . CHOLECYSTECTOMY  1999  . INTRAMEDULLARY (IM) NAIL INTERTROCHANTERIC Right 11/03/2015   Procedure: INTRAMEDULLARY (IM) NAIL RIGHT INTERTROCHANTERIC HIP FRACTURE;  Surgeon: Tarry Kos, MD;  Location: [facility] OR;  Service: Orthopedics;  Laterality: Right;  . LEFT AND RIGHT HEART CATHETERIZATION WITH CORONARY ANGIOGRAM N/A 10/15/2011   Procedure: LEFT AND RIGHT HEART CATHETERIZATION WITH CORONARY ANGIOGRAM;  Surgeon: Chrystie Nose, MD;  Location: [facility] CATH LAB;  Service: Cardiovascular;  Laterality: N/A;  . NM MYOCAR PERF WALL MOTION  03/15/2009   Normal  . PACEMAKER INSERTION  10/16/2011   Medtronic  . PERMANENT PACEMAKER INSERTION N/A 03/24/2011   Procedure: PERMANENT PACEMAKER INSERTION;  Surgeon: Thurmon Fair, MD;  Location: [facility] CATH LAB;  Service: Cardiovascular;  Laterality: N/A;  . REPLACEMENT TOTAL KNEE      OB History    No data available       Home Medications    Prior to Admission medications   Medication Sig Start Date End Date Taking? Authorizing Provider  acetaminophen (TYLENOL) 325 MG tablet Take 650 mg by mouth every 4 (four) hours as needed.   Yes [provider]  amiodarone (PACERONE) 200 MG tablet Take 1 tablet (200 mg total) by mouth daily. 07/18/16  Yes Croitoru, Mihai, MD  AMITIZA 24 MCG capsule Take 24 mcg by mouth 2 (two) times daily. 08/14/16  Yes [provider]  Coenzyme Q10 (CO Q 10 PO) Take 1 tablet by mouth daily.   Yes [provider]  DM-Doxylamine-Acetaminophen (NYQUIL COLD & FLU PO) Take 1 capsule by mouth at bedtime as needed (sleep).   Yes [provider]  ELIQUIS 2.5 MG TABS tablet TAKE ONE TABLET BY MOUTH TWICE DAILY. 03/17/16  Yes Croitoru, Mihai, MD  ferrous sulfate 325 (65 FE) MG tablet Take 325 mg by mouth daily with breakfast.    Yes [provider]  furosemide (LASIX) 40 MG tablet TAKE ONE TABLET BY MOUTH DAILY. 01/15/16  Yes Croitoru, Mihai, MD  glimepiride (AMARYL) 1 MG tablet Take 0.5 tablet (0.5 mg total) by mouth daily if CBG is >120.   Yes [provider]  metoprolol succinate (TOPROL-XL) 25 MG 24 hr tablet TAKE 3 TABLETS BY MOUTH DAILY. 08/07/16  Yes Croitoru, Mihai, MD  Multiple Vitamins-Minerals (CENTRUM SILVER 50+WOMEN PO) Take 1 tablet by mouth daily.   Yes [provider]  Multiple Vitamins-Minerals (ICAPS AREDS 2) CAPS Take 1 capsule by mouth twice daily   Yes [provider]  nitroGLYCERIN (NITROSTAT) 0.4 MG SL tablet DISSOLVE 1 TABLET UNDER TONGUE EVERY 5 MINUTES UP TO 15 MIN FOR CHESTPAIN. IF NO RELIEF CALL 911. 12/25/14  Yes Croitoru, Mihai, MD  ondansetron (ZOFRAN) 4 MG tablet Take 4 mg by mouth every 4 (four) hours as needed for nausea or vomiting.   Yes [provider]  pravastatin (PRAVACHOL) 40 MG tablet TAKE 1 TABLET BY MOUTH EACH EVENING. 06/19/16  Yes Crenshaw, Madolyn Frieze, MD  feeding supplement, ENSURE ENLIVE, (ENSURE ENLIVE) LIQD Take 237 mLs by mouth 2 (two) times daily between meals.  Patient not taking: Reported on 08/21/2016 11/07/15   Alba Cory, MD    Family History Family History  Problem Relation Age of Onset  . Suicidality Mother   . Heart murmur Daughter   . Heart attack Brother     Social History Social History  Substance Use Topics  . Smoking status: Never Smoker  . Smokeless tobacco: Never Used  . Alcohol use No     Allergies   Coumadin [warfarin sodium]; Meloxicam; Metformin and related; Morphine; Nabumetone; Oxycodone; and Sulfonamide derivatives   Review of Systems Review of Systems  All other systems reviewed and are negative.    Physical Exam Updated Vital Signs BP (!) 154/82   Pulse 81   Temp 98.2 F (36.8 C) (Oral)   Resp (!) 32   Ht 5\' 7"  (1.702 m)   Wt 59.9 kg (132 lb)   SpO2 92%   BMI 20.67 kg/m   Physical Exam    Constitutional: She is oriented to person, place, and time. She appears well-developed and well-nourished.  HENT:  Head: Normocephalic and atraumatic.  Eyes: Pupils are equal, round, and reactive to light. Conjunctivae are normal. Right eye exhibits no discharge. Left eye exhibits no discharge. No scleral icterus.  Neck: Normal range of motion. No JVD present. No tracheal deviation present.  Cardiovascular: Normal rate, regular rhythm and normal heart sounds.   Pulmonary/Chest: Effort normal. No stridor. No respiratory distress. She exhibits no tenderness.  Minor bilateral crackels  Abdominal: Soft. She exhibits no distension. There is no tenderness.  Musculoskeletal:  Bilateral lower extremity pitting edema   Neurological: She is alert and oriented to person, place, and time. Coordination normal.  Skin: Skin is warm.  Psychiatric: She has a normal mood and affect. Her behavior is normal. Judgment and thought content normal.  Nursing note and vitals reviewed.    ED Treatments / Results  Labs (all labs ordered are listed, but only abnormal results are displayed) Labs Reviewed  BASIC METABOLIC PANEL - Abnormal; Notable for the following:       Result Value   Chloride 98 (*)    Glucose, Bld 123 (*)    Creatinine, Ser 1.36 (*)    GFR calc non Af Amer 33 (*)    GFR calc Af Amer 39 (*)    All other components within normal limits  CBC - Abnormal; Notable for the following:    RBC 3.64 (*)    MCV 101.6 (*)    All other components within normal limits  BRAIN NATRIURETIC PEPTIDE - Abnormal; Notable for the following:    B Natriuretic Peptide 2,323.4 (*)    All other components within normal limits  BRAIN NATRIURETIC PEPTIDE  I-STAT TROPONIN, ED    EKG  EKG Interpretation  Date/Time:  Thursday August 21 2016 09:21:20 EDT Ventricular Rate:  75 PR Interval:  198 QRS Duration: 152 QT Interval:  496 QTC Calculation: 553 R Axis:   -84 Text Interpretation:  Atrial-sensed  ventricular-paced rhythm Abnormal ECG Confirmed by Bethann Berkshire [phone]) on 08/21/2016 12:10:15 PM       Radiology Dg Chest 2 View  Result Date: 08/21/2016 CLINICAL DATA:  Shortness of breath. EXAM: CHEST  2 VIEW COMPARISON:  Chest x-ray dated November 01, 2015. FINDINGS: The left chest wall AICD is in unchanged position. Stable cardiomegaly. Hyperinflated lungs with coarsened interstitial markings and upper lobe predominant reticulonodular opacities are similar to prior study. New small bibasilar opacities and bilateral pleural effusions. No consolidation or pneumothorax. No  acute osseous abnormality. IMPRESSION: 1. New small bilateral pleural effusions and bibasilar opacities, likely reflecting atelectasis. 2. Unchanged upper lobe predominant reticulonodular interstitial opacities, which could be better evaluated with nonemergent high-resolution chest CT as clinically indicated. Electronically Signed   By: Obie Dredge M.D.   On: 08/21/2016 10:16    Procedures Procedures (including critical care time)  Medications Ordered in ED Medications  albuterol (PROVENTIL) (2.5 MG/3ML) 0.083% nebulizer solution (not administered)  albuterol (PROVENTIL) (2.5 MG/3ML) 0.083% nebulizer solution 5 mg (5 mg Nebulization Given 08/21/16 0939)  furosemide (LASIX) injection 40 mg (40 mg Intravenous Given 08/21/16 1226)     Initial Impression / Assessment and Plan / ED Course  I have reviewed the triage vital signs and the nursing notes.  Pertinent labs & imaging results that were available during my care of the patient were reviewed by me and considered in my medical decision making (see chart for details).      Final Clinical Impressions(s) / ED Diagnoses   Final diagnoses:  SOB (shortness of breath)    83 year old female presents today with shortness of breath status post cardioversion. Question worsening heart failure. Cardiology will consult for further evaluation and management.  New  Prescriptions New Prescriptions   No medications on file     Rosalio Loud 08/21/16 1615    Bethann Berkshire, MD 08/22/16 [phone]

## 2016-08-21 NOTE — Telephone Encounter (Signed)
Received a call from patient's daughter [name].She stated mother had cardioversion yesterday.Stated she woke up this morning very sob.Stated she is sob just sitting in chair.Stated O2 sat 74  Pulse 76.Advised to go to [facility] ED.

## 2016-08-21 NOTE — ED Notes (Signed)
1 unsuccessful attempt to draw labs, left wrist

## 2016-08-21 NOTE — Telephone Encounter (Signed)
New message      Pt c/o Shortness Of Breath: STAT if SOB developed within the last 24 hours or pt is noticeably SOB on the phone  1. Are you currently SOB (can you hear that pt is SOB on the phone)?  Talking to daughter  2. How long have you been experiencing SOB? Started this am 3. Are you SOB when sitting or when up moving around?    4. Are you currently experiencing any other symptoms?  Pt had cardioversion yesterday.  Her pulse is 82.  Pt has had 2 nitro this am

## 2016-08-21 NOTE — ED Notes (Signed)
Attempted Report 

## 2016-08-21 NOTE — ED Notes (Signed)
Pt became short of breath form moving bed to the bedside toilet

## 2016-08-21 NOTE — ED Triage Notes (Addendum)
  Pt reports this morning woke up feeling like she could not breathe well, dry cough non productive. 92% on room air in triage.  Pt had an outpatient cardioversion yesterday. Pt denies chest pain.   After triaging pt she now reports to taking 2 nitros this morning. Pt states she was not having pain earlier.

## 2016-08-21 NOTE — H&P (Signed)
Cardiology Admission History and Physical:   Patient ID: Kathleen [name]; MRN: [id number]; DOB: 1924/08/16   Admission date: 08/21/2016  Primary Care Provider: Redmond School, MD Primary Cardiologist: Dr. Sallyanne Kuster   Chief Complaint:  Dyspnea   Patient Profile:   Kathleen [name] is a 83 y.o. female with a hx of long-standing nonischemic cardiomyopathy, high-grade AV block requiring permanent pacemaker therapy (upgrade to biventricular pacemaker after she developed congestive heart failure, excellent response to CRT), paroxysmal atrial fibrillation on chronic amiodarone and Eliquis therapy and  hyperlipidemia who is being seen today for the evaluation of shortness of Breath.   Patient had successful cardioversion yesterday for Persistent atrial fibrillation detected by her pacemaker. She also had volume overload recently despite compression stocking.   History of Present Illness:   Kathleen [name] felt fatigue and "worn out" two after returning home from [facility]. She went to bed early last night. Had orthopnea overnight but no pnd. This morning she had acute dyspnea. Did not relived. She did not had any chest pain but took SL nitro x 1 with very minimal improvement however dyspnea worsen with ER. She took another SL nitro without any improvement and decided to come to ER. POC troponin 0.02. BNP of 2323. Scr 1.36. CXR small bilateral effusion. EKG showed V paced rhythm. She was given IV lasix 51m x 1 approximately 5 hours ago. Net I & O negative 350cc.   Past Medical History:  Diagnosis Date  . Acid reflux   . 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 CAD (coronary artery disease)   . Chronic kidney disease (CKD), stage III (moderate)   . Essential hypertension   . History of pneumonia   . Hyperlipidemia   . Macular degeneration   . Nonischemic cardiomyopathy ([facility]   . PAF (paroxysmal atrial fibrillation) ([facility]   . Type 2 diabetes mellitus ([facility]     Past Surgical  History:  Procedure Laterality Date  . Bladder tack  1970's  . CARDIAC CATHETERIZATION  10/15/2011   Normal coronaries  . CARDIOVERSION N/A 10/28/2013   Procedure: CARDIOVERSION;  Surgeon: MSanda Klein MD;  Location: [facility];  Service: Cardiovascular;  Laterality: N/A;  . CHOLECYSTECTOMY  1999  . INTRAMEDULLARY (IM) NAIL INTERTROCHANTERIC Right 11/03/2015   Procedure: INTRAMEDULLARY (IM) NAIL RIGHT INTERTROCHANTERIC HIP FRACTURE;  Surgeon: NLeandrew Koyanagi MD;  [place] [facility]  Service: Orthopedics;  Laterality: Right;  . LEFT AND RIGHT HEART CATHETERIZATION WITH CORONARY ANGIOGRAM N/A 10/15/2011   Procedure: LEFT AND RIGHT HEART CATHETERIZATION WITH CORONARY ANGIOGRAM;  Surgeon: KPixie Casino MD;  [facility];  Service: Cardiovascular;  Laterality: N/A;  . NM MYOCAR PERF WALL MOTION  03/15/2009   Normal  . PACEMAKER INSERTION  10/16/2011   Medtronic  . PERMANENT PACEMAKER INSERTION N/A 03/24/2011   Procedure: PERMANENT PACEMAKER INSERTION;  Surgeon: MSanda Klein MD;  [facility];  Service: Cardiovascular;  Laterality: N/A;  . REPLACEMENT TOTAL KNEE       Medications Prior to Admission: Prior to Admission medications   Medication Sig Start Date End Date Taking? Authorizing Provider  acetaminophen (TYLENOL) 325 MG tablet Take 650 mg by mouth every 4 (four) hours as needed.   Yes [provider]  amiodarone (PACERONE) 200 MG tablet Take 1 tablet (200 mg total) by mouth daily. 07/18/16  Yes Dyshawn Cangelosi, MD  AMITIZA 24 MCG capsule Take 24 mcg by mouth 2 (two) times daily. 08/14/16  Yes [provider]  Coenzyme Q10 (CO Q 10 PO) Take 1 tablet by mouth daily.   Yes [provider]  DM-Doxylamine-Acetaminophen (NYQUIL COLD & FLU PO) Take 1 capsule by mouth at bedtime as needed (sleep).   Yes [provider]  ELIQUIS 2.5 MG TABS tablet TAKE ONE TABLET BY MOUTH TWICE DAILY. 03/17/16  Yes Janalynn Eder, MD  ferrous sulfate 325 (65 FE)  MG tablet Take 325 mg by mouth daily with breakfast.    Yes [provider]  furosemide (LASIX) 40 MG tablet TAKE ONE TABLET BY MOUTH DAILY. 01/15/16  Yes Lekeya Rollings, MD  glimepiride (AMARYL) 1 MG tablet Take 0.5 tablet (0.5 mg total) by mouth daily if CBG is >120.   Yes [provider]  metoprolol succinate (TOPROL-XL) 25 MG 24 hr tablet TAKE 3 TABLETS BY MOUTH DAILY. 08/07/16  Yes Nyjai Graff, MD  Multiple Vitamins-Minerals (CENTRUM SILVER 50+WOMEN PO) Take 1 tablet by mouth daily.   Yes [provider]  Multiple Vitamins-Minerals (ICAPS AREDS 2) CAPS Take 1 capsule by mouth twice daily   Yes [provider]  nitroGLYCERIN (NITROSTAT) 0.4 MG SL tablet DISSOLVE 1 TABLET UNDER TONGUE EVERY 5 MINUTES UP TO 15 MIN FOR CHESTPAIN. IF NO RELIEF CALL 911. 12/25/14  Yes Constant Mandeville, MD  ondansetron (ZOFRAN) 4 MG tablet Take 4 mg by mouth every 4 (four) hours as needed for nausea or vomiting.   Yes [provider]  pravastatin (PRAVACHOL) 40 MG tablet TAKE 1 TABLET BY MOUTH EACH EVENING. 06/19/16  Yes Crenshaw, Denice Bors, MD  feeding supplement, ENSURE ENLIVE, (ENSURE ENLIVE) LIQD Take 237 mLs by mouth 2 (two) times daily between meals. Patient not taking: Reported on 08/21/2016 11/07/15   Elmarie Shiley, MD     Allergies:    Allergies  Allergen Reactions  . Coumadin [Warfarin Sodium] Other (See Comments)    Caused Patient to Bleed Out.   . Meloxicam Other (See Comments)    Unknown  . Metformin And Related Other (See Comments)    Cannot have due to kidney function  . Morphine Itching    Not in right state of mind.   . Nabumetone Nausea And Vomiting  . Oxycodone Nausea And Vomiting  . Sulfonamide Derivatives Hives    Social History:   Social History   Social History  . Marital status: Widowed    Spouse name: N/A  . Number of children: N/A  . Years of education: N/A   Occupational History  . retired    Social History Main Topics  .  Smoking status: Never Smoker  . Smokeless tobacco: Never Used  . Alcohol use No  . Drug use: No  . Sexual activity: Not Currently   Other Topics Concern  . Not on file   Social History Narrative  . No narrative on file    Family History:   The patient's family history includes Heart attack in her brother; Heart murmur in her daughter; Suicidality in her mother.    ROS:  Please see the history of present illness. All other ROS reviewed and negative.     Physical Exam/Data:   Vitals:   08/21/16 1530 08/21/16 1600 08/21/16 1615 08/21/16 1630  BP: (!) 161/78 (!) 158/77  (!) 155/102  Pulse: 73 76 76 75  Resp: (!) 24 (!) 29 (!) 22 (!) 27  Temp:      TempSrc:      SpO2: 92% 95% 95% 93%  Weight:  Height:        Intake/Output Summary (Last 24 hours) at 08/21/16 1707 Last data filed at 08/21/16 1313  Gross per 24 hour  Intake                0 ml  Output              350 ml  Net             -350 ml   Filed Weights   08/21/16 0905  Weight: 132 lb (59.9 kg)   Body mass index is 20.67 kg/m.  General:  Well nourished, well developed, in no acute distress HEENT: normal Lymph: no adenopathy Neck: + JVD Endocrine:  No thryomegaly Vascular: No carotid bruits; FA pulses 2+ bilaterally without bruits  Cardiac:  normal S1, S2; RRR; no murmur  Lungs:  clear to auscultation bilaterally, no wheezing, rhonchi or rales  Abd: soft, nontender, no hepatomegaly  Ext: 1+ BL LE edema Musculoskeletal:  No deformities, BUE and BLE strength normal and equal Skin: warm and dry  Neuro:  CNs 2-12 intact, no focal abnormalities noted Psych:  Normal affect    EKG:  The ECG that was done was personally reviewed and demonstrates: sinus rhythm with V paced   Relevant CV Studies: Echo 04/2015 - Left ventricle: The cavity size was normal. Wall thickness was   increased in a pattern of mild LVH. Systolic function was mildly   to moderately reduced. The estimated ejection fraction was in the    range of 40% to 45%. There is akinesis of the inferolateral and   inferior myocardium. Doppler parameters are consistent with   restrictive physiology, indicative of decreased left ventricular   diastolic compliance and/or increased left atrial pressure. - Aortic valve: Trileaflet; mildly calcified leaflets. - Mitral valve: Calcified annulus. There was moderate   regurgitation. - Left atrium: The atrium was severely dilated. - Right ventricle: Pacer wire or catheter noted in right ventricle. - Right atrium: The atrium was mildly dilated. - Tricuspid valve: There was mild regurgitation. - Pulmonary arteries: PA peak pressure: 49 mm Hg (S). - Pericardium, extracardiac: There was no pericardial effusion.  Impressions:  - Mild LVH with LVEF approximately 40-45%. There is akinesis of the   inferolateral wall. Restrictive diastolic filling pattern with   increased LV filling pressure. Severe left atrial enlargement.   MAC with moderate mitral regurgitation. Mildly sclerotic aortic   valve. Device wire noted within the right heart. Mild tricuspid   regurgitation with PASP 49 mmHg.  Laboratory Data:  Chemistry Recent Labs Lab 08/21/16 0924  NA 137  K 3.7  CL 98*  CO2 29  GLUCOSE 123*  BUN 13  CREATININE 1.36*  CALCIUM 9.2  GFRNONAA 33*  GFRAA 39*  ANIONGAP 10    No results for input(s): PROT, ALBUMIN, AST, ALT, ALKPHOS, BILITOT in the last 168 hours. Hematology Recent Labs Lab 08/21/16 0924  WBC 5.8  RBC 3.64*  HGB 12.1  HCT 37.0  MCV 101.6*  MCH 33.2  MCHC 32.7  RDW 14.4  PLT 193   Cardiac EnzymesNo results for input(s): TROPONINI in the last 168 hours.  Recent Labs Lab 08/21/16 0955  TROPIPOC 0.02    BNP Recent Labs Lab 08/21/16 1459  BNP 2,323.4*    DDimer No results for input(s): DDIMER in the last 168 hours.  Radiology/Studies:  Dg Chest 2 View  Result Date: 08/21/2016 CLINICAL DATA:  Shortness of breath. EXAM: CHEST  2 VIEW COMPARISON:  Chest  x-ray dated November 01, 2015. FINDINGS: The left chest wall AICD is in unchanged position. Stable cardiomegaly. Hyperinflated lungs with coarsened interstitial markings and upper lobe predominant reticulonodular opacities are similar to prior study. New small bibasilar opacities and bilateral pleural effusions. No consolidation or pneumothorax. No acute osseous abnormality. IMPRESSION: 1. New small bilateral pleural effusions and bibasilar opacities, likely reflecting atelectasis. 2. Unchanged upper lobe predominant reticulonodular interstitial opacities, which could be better evaluated with nonemergent high-resolution chest CT as clinically indicated. Electronically Signed   By: Titus Dubin M.D.   On: 08/21/2016 10:16    Assessment and Plan:   1. Acute on chronic combined CHF - BNP > 2000. Admit and diuresis with IV lasix 43m BID. Strict I & O and daily weight.   2. PAF - S/p DCCV yesterday. Maintaining sinus rhythm. Continue eliquis.     SJarrett Soho PA  08/21/2016 5:07 PM   I have seen and examined the patient along with Bhagat,Bhavinkumar, PA .  I have reviewed the chart, notes and new data.  I agree with PA's note.  Key new complaints: still speaking in interrupted sentences and orthopneic. Urinating every 15 min Key examination changes: RRR (paced rhythm), no clear S3, JVP 9-10 cm, 2+ leg edema Key new findings / data: Atrial sensed (sinus), BiV paced rhythm with +R in V1 c/w effective LV pacing/CRT  PLAN: Admit for diuresis for acute CHF exacerbation(combined systolic and diastolic). Suspect partly due to missing a furosemide dose, but may be some post CV myocardial stunning.  Add ARB. It will probably take 48 hours to safely diurese this elderly patient.  MSanda Klein MD, [phone]/09/2016, 5:35 PM

## 2016-08-22 ENCOUNTER — Encounter (HOSPITAL_COMMUNITY): Payer: Self-pay

## 2016-08-22 ENCOUNTER — Telehealth: Payer: Self-pay | Admitting: Cardiovascular Disease

## 2016-08-22 DIAGNOSIS — I481 Persistent atrial fibrillation: Secondary | ICD-10-CM | POA: Diagnosis not present

## 2016-08-22 DIAGNOSIS — I5043 Acute on chronic combined systolic (congestive) and diastolic (congestive) heart failure: Secondary | ICD-10-CM | POA: Diagnosis not present

## 2016-08-22 DIAGNOSIS — I420 Dilated cardiomyopathy: Secondary | ICD-10-CM | POA: Diagnosis not present

## 2016-08-22 DIAGNOSIS — R0602 Shortness of breath: Secondary | ICD-10-CM | POA: Diagnosis not present

## 2016-08-22 DIAGNOSIS — Z950 Presence of cardiac pacemaker: Secondary | ICD-10-CM | POA: Diagnosis not present

## 2016-08-22 DIAGNOSIS — I130 Hypertensive heart and chronic kidney disease with heart failure and stage 1 through stage 4 chronic kidney disease, or unspecified chronic kidney disease: Secondary | ICD-10-CM | POA: Diagnosis not present

## 2016-08-22 LAB — BASIC METABOLIC PANEL
Anion gap: 11 (ref 5–15)
BUN: 12 mg/dL (ref 6–20)
CHLORIDE: 95 mmol/L — AB (ref 101–111)
CO2: 33 mmol/L — ABNORMAL HIGH (ref 22–32)
CREATININE: 1.19 mg/dL — AB (ref 0.44–1.00)
Calcium: 8.8 mg/dL — ABNORMAL LOW (ref 8.9–10.3)
GFR calc Af Amer: 46 mL/min — ABNORMAL LOW (ref >60)
GFR, EST NON AFRICAN AMERICAN: 39 mL/min — AB (ref >60)
Glucose, Bld: 112 mg/dL — ABNORMAL HIGH (ref 65–99)
Potassium: 3.1 mmol/L — ABNORMAL LOW (ref 3.5–5.1)
SODIUM: 139 mmol/L (ref 135–145)

## 2016-08-22 LAB — GLUCOSE, CAPILLARY: GLUCOSE-CAPILLARY: 129 mg/dL — AB (ref 65–99)

## 2016-08-22 MED ORDER — POTASSIUM CHLORIDE CRYS ER 20 MEQ PO TBCR: EXTENDED_RELEASE_TABLET | ORAL | 3 refills | Status: AC

## 2016-08-22 MED ORDER — POTASSIUM CHLORIDE CRYS ER 20 MEQ PO TBCR
40.0000 meq | EXTENDED_RELEASE_TABLET | Freq: Two times a day (BID) | ORAL | Status: DC
  Administered 2016-08-22: 40 meq via ORAL
  Filled 2016-08-22: qty 2

## 2016-08-22 MED ORDER — FUROSEMIDE 40 MG PO TABS: ORAL_TABLET | ORAL | 3 refills | Status: DC

## 2016-08-22 MED ORDER — FUROSEMIDE 40 MG PO TABS: 40.0000 mg | ORAL_TABLET | Freq: Two times a day (BID) | ORAL | Status: DC

## 2016-08-22 MED ORDER — LOSARTAN POTASSIUM 25 MG PO TABS: 25.0000 mg | ORAL_TABLET | Freq: Every day | ORAL | 3 refills | Status: DC

## 2016-08-22 MED ORDER — POTASSIUM CHLORIDE CRYS ER 20 MEQ PO TBCR
40.0000 meq | EXTENDED_RELEASE_TABLET | Freq: Two times a day (BID) | ORAL | Status: AC
  Administered 2016-08-22: 40 meq via ORAL
  Filled 2016-08-22: qty 2

## 2016-08-22 NOTE — Progress Notes (Signed)
Patient has order to discharge to SNF. Discharge instructions reviewed with patient and family.

## 2016-08-22 NOTE — Discharge Instructions (Addendum)

## 2016-08-22 NOTE — Discharge Summary (Signed)
Discharge Summary    Patient ID: Kathleen [name],  MRN: [id number], DOB/AGE: [birth year] 83 y.o.  Admit date: 08/21/2016 Discharge date: 08/22/2016   Primary Care Provider: Elfredia [name] Primary Cardiologist: Dr. Royann [name]  Discharge Diagnoses    Principal Problem:   Acute on chronic combined systolic (congestive) and diastolic (congestive) heart failure (HCC) Active Problems:   Hypertension   DM type 2 (diabetes mellitus, type 2) (HCC)   Hyperlipidemia   Paroxysmal atrial fibrillation (HCC)   Coronary atherosclerosis, no significant obstructive lesions   Hypokalemia   CKD (chronic kidney disease) stage 3, GFR 30-59 ml/min   Acute combined systolic and diastolic CHF, NYHA class 1 (HCC)   Allergies Allergies  Allergen Reactions  . Coumadin [Warfarin Sodium] Other (See Comments)    Caused Patient to Bleed Out.   . Meloxicam Other (See Comments)    Unknown  . Metformin And Related Other (See Comments)    Cannot have due to kidney function  . Morphine Itching    Not in right state of mind.   . Nabumetone Nausea And Vomiting  . Oxycodone Nausea And Vomiting  . Sulfonamide Derivatives Hives     History of Present Illness     Kathleen [name] is a 83 y.o. female with a hx of long-standing nonischemic cardiomyopathy, high-grade AV block requiring permanent pacemaker therapy (upgrade to biventricular pacemaker after she developed congestive heart failure, excellent response to CRT - 2013), paroxysmal atrial fibrillation on chronic amiodarone and Eliquis therapy and hyperlipidemia who is being seen 08/21/16 for the evaluation of shortness of breath.   Patient had successful cardioversion 08/20/16 for persistent atrial fibrillation detected by her pacemaker. She also had volume overload recently despite compression stockings.   Kathleen. [name] felt fatigue and "worn out" two after returning home from [facility]V. She went to bed early last evening on 08/20/16. She reported orthopnea  overnight but no pnd. This morning on 08/21/16, she had acute dyspnea. She denied chest pain but took SL nitro x 1 with very minimal improvement. Dyspnea worsened worsened and she took another SL nitro without any improvement and decided to come to ER. POC troponin 0.02. BNP of 2323. Scr 1.36. CXR small bilateral effusion. EKG showed V paced rhythm. She was given IV lasix 40mg  x 1 approximately 5 hours ago. Net I & O negative 350cc.   Hospital Course     Consultants: None  Kathleen [name] was admitted for IV diuresis. She was given 60 mg IV lasix BID with strict I&Os and daily weights. Her admission weight was 132 lbs. At discharge, she is overall net negative 1.7 L with good urine output (1.95 L urine output yesterday) and a weight of 124 lbs. Re-established dry weight is 120-125 lbs. Losartan was added to her regimen. She diuresed and improved very quickly while in the hospital. She ambulated with staff and maintained oxygen saturations in the mid-90s.   Afib and CRT-P She is maintaining A-paced/sinus rhythm with 100% BiV pacing per telemetry. She was continued on low dose of amiodarone and dose-adjusted eliquis (for age and body weight). She has a severely dilated left atrium and Afib recurrence is likely. After starting amiodarone, she had a high grade AV block with periods of complete heart block. She recently had normal LFTs and TSH. Home toprol was continued.  Hypokalemia Resolved with PO replacement to K of 3.7; however K at discharge down to 3.1 on 60mg  IV lasix BID and 40 mEq potassium. Upon review, she only  received one dose of supplemental potassium. Will discharge on adjsuted regimen with BMP recheck at OP follow up. She was discharged on 40 mg daily with PRN doses for 3lb weight gain in 1 day or 5 lbs weight gain in 1 week. Kdur 20 mEq daily with PRN doses for days she takes extra lasix.  Patient seen and examined by Dr. Royann [name] today and was stable for discharge. All follow up has been  arranged.  _____________  Discharge Vitals Blood pressure (!) 118/59, pulse 72, temperature 97.6 F (36.4 C), temperature source Oral, resp. rate 20, height 5\' 7"  (1.702 m), weight 124 lb 12.8 oz (56.6 kg), SpO2 95 %.  Filed Weights   08/21/16 0905 08/21/16 1903 08/22/16 0508  Weight: 132 lb (59.9 kg) 129 lb 6.4 oz (58.7 kg) 124 lb 12.8 oz (56.6 kg)    Labs & Radiologic Studies    CBC  Recent Labs  08/21/16 0924  WBC 5.8  HGB 12.1  HCT 37.0  MCV 101.6*  PLT 193   Basic Metabolic Panel  Recent Labs  08/21/16 0924 08/22/16 0324  NA 137 139  K 3.7 3.1*  CL 98* 95*  CO2 29 33*  GLUCOSE 123* 112*  BUN 13 12  CREATININE 1.36* 1.19*  CALCIUM 9.2 8.8*   Liver Function Tests No results for input(s): AST, ALT, ALKPHOS, BILITOT, PROT, ALBUMIN in the last 72 hours. No results for input(s): LIPASE, AMYLASE in the last 72 hours. Cardiac Enzymes No results for input(s): CKTOTAL, CKMB, CKMBINDEX, TROPONINI in the last 72 hours. BNP Invalid input(s): POCBNP D-Dimer No results for input(s): DDIMER in the last 72 hours. Hemoglobin A1C No results for input(s): HGBA1C in the last 72 hours. Fasting Lipid Panel No results for input(s): CHOL, HDL, LDLCALC, TRIG, CHOLHDL, LDLDIRECT in the last 72 hours. Thyroid Function Tests No results for input(s): TSH, T4TOTAL, T3FREE, THYROIDAB in the last 72 hours.  Invalid input(s): FREET3 _____________  Dg Chest 2 View  Result Date: 08/21/2016 CLINICAL DATA:  Shortness of breath. EXAM: CHEST  2 VIEW COMPARISON:  Chest x-ray dated November 01, 2015. FINDINGS: The left chest wall AICD is in unchanged position. Stable cardiomegaly. Hyperinflated lungs with coarsened interstitial markings and upper lobe predominant reticulonodular opacities are similar to prior study. New small bibasilar opacities and bilateral pleural effusions. No consolidation or pneumothorax. No acute osseous abnormality. IMPRESSION: 1. New small bilateral pleural effusions  and bibasilar opacities, likely reflecting atelectasis. 2. Unchanged upper lobe predominant reticulonodular interstitial opacities, which could be better evaluated with nonemergent high-resolution chest CT as clinically indicated. Electronically Signed   By: Obie Dredge M.D.   On: 08/21/2016 10:16     Diagnostic Studies/Procedures    08/20/2016 DCCV Patient placed on cardiac monitor, pulse oximetry, supplemental oxygen as necessary.  Sedation given: propofol 30 mg IV Anesthesiology Pacer pads placed anterior and posterior chest.  Cardioverted 1time(s).  Cardioversion with synchronized biphasic 120Jshock.  Evaluation: Findings: Post procedure EKG shows: A paced V paced. Normal device check after CV. Complications: None Patient didtolerate procedure well.  ECHO 05/11/2015  - Left ventricle: The cavity size was normal. Wall thickness wasincreased in a pattern of mild LVH. Systolic function was mildlyto moderately reduced. The estimated ejection fraction was in therange of 40% to 45%. There is akinesis of the inferolateral andinferior myocardium. Doppler parameters are consistent with restrictive physiology, indicative of decreased left ventriculardiastolic compliance and/or increased left atrial pressure. - Aortic valve: Trileaflet; mildly calcified leaflets. - Mitral valve: Calcified annulus. There was  moderateregurgitation. - Left atrium: The atrium was severely dilated. - Right ventricle: Pacer wire or catheter noted in right ventricle. - Right atrium: The atrium was mildly dilated. - Tricuspid valve: There was mild regurgitation. - Pulmonary arteries: PA peak pressure: 49 mm Hg (S). - Pericardium, extracardiac: There was no pericardial effusion.     Disposition   Pt is being discharged home today in good condition.  Follow-up Plans & Appointments    Follow-up Information    Croitoru, Mihai, MD Follow up on 09/04/2016.   Specialty:  Cardiology Why:  10:20  am TCM Contact information: [address] [place] [address] [phone]          Discharge Instructions    Diet - low sodium heart healthy    Complete by:  As directed    Increase activity slowly    Complete by:  As directed       Discharge Medications   Current Discharge Medication List    START taking these medications   Details  losartan (COZAAR) 25 MG tablet Take 1 tablet (25 mg total) by mouth daily. Qty: 30 tablet, Refills: 3    potassium chloride SA (K-DUR,KLOR-CON) 20 MEQ tablet Take 1 capsule (20 mEq) daily. Take an extra 1 capsule (20 mEq) on days you take an extra lasix. Qty: 45 tablet, Refills: 3      CONTINUE these medications which have CHANGED   Details  furosemide (LASIX) 40 MG tablet Take 40 mg (1 tablet) daily. If weight increases by 3 lbs in 1 day or 5 lbs in 1 week, take a second 40 mg (1 tablet). Qty: 45 tablet, Refills: 3      CONTINUE these medications which have NOT CHANGED   Details  acetaminophen (TYLENOL) 325 MG tablet Take 650 mg by mouth every 4 (four) hours as needed.    amiodarone (PACERONE) 200 MG tablet Take 1 tablet (200 mg total) by mouth daily. Qty: 90 tablet, Refills: 3    AMITIZA 24 MCG capsule Take 24 mcg by mouth 2 (two) times daily.    Coenzyme Q10 (CO Q 10 PO) Take 1 tablet by mouth daily.    DM-Doxylamine-Acetaminophen (NYQUIL COLD & FLU PO) Take 1 capsule by mouth at bedtime as needed (sleep).    ELIQUIS 2.5 MG TABS tablet TAKE ONE TABLET BY MOUTH TWICE DAILY. Qty: 60 tablet, Refills: 5    ferrous sulfate 325 (65 FE) MG tablet Take 325 mg by mouth daily with breakfast.     glimepiride (AMARYL) 1 MG tablet Take 0.5 tablet (0.5 mg total) by mouth daily if CBG is >120.    metoprolol succinate (TOPROL-XL) 25 MG 24 hr tablet TAKE 3 TABLETS BY MOUTH DAILY. Qty: 90 tablet, Refills: 1    Multiple Vitamins-Minerals (CENTRUM SILVER 50+WOMEN PO) Take 1 tablet by mouth daily.    Multiple  Vitamins-Minerals (ICAPS AREDS 2) CAPS Take 1 capsule by mouth twice daily    nitroGLYCERIN (NITROSTAT) 0.4 MG SL tablet DISSOLVE 1 TABLET UNDER TONGUE EVERY 5 MINUTES UP TO 15 MIN FOR CHESTPAIN. IF NO RELIEF CALL 911. Qty: 25 tablet, Refills: 4    ondansetron (ZOFRAN) 4 MG tablet Take 4 mg by mouth every 4 (four) hours as needed for nausea or vomiting.    pravastatin (PRAVACHOL) 40 MG tablet TAKE 1 TABLET BY MOUTH EACH EVENING. Qty: 30 tablet, Refills: 6    feeding supplement, ENSURE ENLIVE, (ENSURE ENLIVE) LIQD Take 237 mLs by mouth 2 (two) times daily between meals.  Qty: 237 mL, Refills: 12          Outstanding Labs/Studies   BMP for K and creatinine  Duration of Discharge Encounter   Greater than 30 minutes including physician time.  Signed, Roe Rutherford Olajuwon Fosdick PA-C 08/22/2016, 11:24 AM

## 2016-08-22 NOTE — Telephone Encounter (Signed)
Pt currently admitted, will call back.

## 2016-08-22 NOTE — Progress Notes (Signed)
Initial Nutrition Assessment  DOCUMENTATION CODES:   Severe malnutrition in context of chronic illness  INTERVENTION:    Boost Breeze po TID, each supplement provides 250 kcal and 9 grams of protein  Continue MVI daily  NUTRITION DIAGNOSIS:   Malnutrition (severe) related to chronic illness (CAD, HF) as evidenced by severe depletion of muscle mass, percent weight loss.  GOAL:   Patient will meet greater than or equal to 90% of their needs  MONITOR:   PO intake, Supplement acceptance, Weight trends, I & O's  REASON FOR ASSESSMENT:   Malnutrition Screening Tool    ASSESSMENT:   83 yo female with PMH of non-ischemic cardiomyopathy, pacemaker, CHF, PAF, HLD, DM, HTN, CKD, and macular degeneration, who was admitted on 8/9 with SOB related to volume overload.  Patient reports that she has been eating poorly at home. She has early satiety. Encouraged snacks between meals. She has been drinking Ensure, but thinks it has caused loose stools, so she quit drinking it 1 week ago. She tried and likes [employer] peach flavored supplement.  Nutrition-Focused physical exam completed. Findings are mild-moderate fat depletion, severe muscle depletion, and mild edema.   8% weight loss within the past 1-3 months is significant for the time frame.   Labs reviewed: potassium 3.1 (L) CBG's: 129 Medications reviewed and include ferrous sulfate, Lasix, MVI, and KCl.  Diet Order:  Diet heart healthy/carb modified Room service appropriate? Yes; Fluid consistency: Thin  Skin:  Reviewed, no issues  Last BM:  unknown  Height:   Ht Readings from Last 1 Encounters:  08/21/16 5\' 7"  (1.702 m)    Weight:   Wt Readings from Last 1 Encounters:  08/22/16 124 lb 12.8 oz (56.6 kg)    Ideal Body Weight:  61.4 kg  BMI:  Body mass index is 19.55 kg/m.  Estimated Nutritional Needs:   Kcal:  1400-1600  Protein:  75-85 gm  Fluid:  1.5 L  EDUCATION NEEDS:   Education needs  addressed  Joaquin Courts, RD, LDN, CNSC Pager [phone] After Hours Pager [phone]

## 2016-08-22 NOTE — Telephone Encounter (Signed)
New message      TCM appt made on 09-04-16 at 10:20 with Dr Royann Shivers per [facility] [place]

## 2016-08-22 NOTE — Progress Notes (Signed)
Progress Note  Patient Name: Kathleen [name] Date of Encounter: 08/22/2016  Primary Cardiologist: Elya Tarquinio  Subjective   Feeling much better. Slept HOB<30 deg. Excellent diuresis (almost 2L last shift) and weight down 7 lb already. Creat improved with diuresis. K 3.1.  Inpatient Medications    Scheduled Meds: . amiodarone  200 mg Oral Daily  . apixaban  2.5 mg Oral BID  . ferrous sulfate  325 mg Oral Q breakfast  . furosemide  60 mg Intravenous BID  . insulin aspart  0-9 Units Subcutaneous TID WC  . losartan  25 mg Oral Daily  . lubiprostone  24 mcg Oral BID  . metoprolol succinate  75 mg Oral Daily  . multivitamin with minerals  1 tablet Oral Daily  . potassium chloride  40 mEq Oral BID  . pravastatin  40 mg Oral q1800   Continuous Infusions:  PRN Meds: acetaminophen, nitroGLYCERIN, ondansetron (ZOFRAN) IV   Vital Signs    Vitals:   08/21/16 1903 08/22/16 0114 08/22/16 0508 08/22/16 0514  BP: (!) 156/69 (!) 162/74 138/60   Pulse: 80 79 72   Resp: 20 18 (!) 21   Temp: 98.7 F (37.1 C) 98.4 F (36.9 C) 98.3 F (36.8 C)   TempSrc: Oral Oral Oral   SpO2: 93% 92% (!) 89% 95%  Weight: 129 lb 6.4 oz (58.7 kg)  124 lb 12.8 oz (56.6 kg)   Height: 5\' 7"  (1.702 m)       Intake/Output Summary (Last 24 hours) at 08/22/16 0825 Last data filed at 08/22/16 0507  Gross per 24 hour  Intake              280 ml  Output             1950 ml  Net            -1670 ml   Filed Weights   08/21/16 0905 08/21/16 1903 08/22/16 0508  Weight: 132 lb (59.9 kg) 129 lb 6.4 oz (58.7 kg) 124 lb 12.8 oz (56.6 kg)    Telemetry    A pced alternating with normal sinus, 100% BiV paced - Personally Reviewed  ECG    No new ecg - Personally Reviewed  Physical Exam  Smiling, relaxed GEN: No acute distress.   Neck: 5-6 cm JVD Cardiac: RRR, no murmurs, rubs, or gallops.  Respiratory: Clear to auscultation bilaterally. GI: Soft, nontender, non-distended  MS: 1-2+ symmetrical ankle  edema; No deformity. Neuro:  Nonfocal  Psych: Normal affect   Labs    Chemistry Recent Labs Lab 08/21/16 0924 08/22/16 0324  NA 137 139  K 3.7 3.1*  CL 98* 95*  CO2 29 33*  GLUCOSE 123* 112*  BUN 13 12  CREATININE 1.36* 1.19*  CALCIUM 9.2 8.8*  GFRNONAA 33* 39*  GFRAA 39* 46*  ANIONGAP 10 11     Hematology Recent Labs Lab 08/21/16 0924  WBC 5.8  RBC 3.64*  HGB 12.1  HCT 37.0  MCV 101.6*  MCH 33.2  MCHC 32.7  RDW 14.4  PLT 193    Cardiac EnzymesNo results for input(s): TROPONINI in the last 168 hours.  Recent Labs Lab 08/21/16 0955  TROPIPOC 0.02     BNP Recent Labs Lab 08/21/16 1459  BNP 2,323.4*     DDimer No results for input(s): DDIMER in the last 168 hours.   Radiology    Dg Chest 2 View  Result Date: 08/21/2016 CLINICAL DATA:  Shortness of breath. EXAM: CHEST  2 VIEW COMPARISON:  Chest x-ray dated November 01, 2015. FINDINGS: The left chest wall AICD is in unchanged position. Stable cardiomegaly. Hyperinflated lungs with coarsened interstitial markings and upper lobe predominant reticulonodular opacities are similar to prior study. New small bibasilar opacities and bilateral pleural effusions. No consolidation or pneumothorax. No acute osseous abnormality. IMPRESSION: 1. New small bilateral pleural effusions and bibasilar opacities, likely reflecting atelectasis. 2. Unchanged upper lobe predominant reticulonodular interstitial opacities, which could be better evaluated with nonemergent high-resolution chest CT as clinically indicated. Electronically Signed   By: Obie Dredge M.D.   On: 08/21/2016 10:16    Cardiac Studies   08/20/2016 DCCV Patient placed on cardiac monitor, pulse oximetry, supplemental oxygen as necessary.  Sedation given: propofol 30 mg IV Anesthesiology Pacer pads placed anterior and posterior chest.  Cardioverted 1 time(s).  Cardioversion with synchronized biphasic 120J shock.  Evaluation: Findings: Post procedure EKG  shows: A paced V paced. Normal device check after CV. Complications: None Patient did tolerate procedure well.  ECHO 05/11/2015  - Left ventricle: The cavity size was normal. Wall thickness was increased in a pattern of mild LVH. Systolic function was mildly to moderately reduced. The estimated ejection fraction was in the range of 40% to 45%. There is akinesis of the inferolateral and inferior myocardium. Doppler parameters are consistent with   restrictive physiology, indicative of decreased left ventricular diastolic compliance and/or increased left atrial pressure. - Aortic valve: Trileaflet; mildly calcified leaflets. - Mitral valve: Calcified annulus. There was moderate regurgitation. - Left atrium: The atrium was severely dilated. - Right ventricle: Pacer wire or catheter noted in right ventricle. - Right atrium: The atrium was mildly dilated. - Tricuspid valve: There was mild regurgitation. - Pulmonary arteries: PA peak pressure: 49 mm Hg (S). - Pericardium, extracardiac: There was no pericardial effusion.   Patient Profile     83 y.o. female with dilated CMP (EF 40-45%), high grade AV block and CRT-P and recent DCCV for persistent atrial fibrillation (6 weeks), admitted for acute exacerbation of CHF, following cardioversion and missed doses of diuretic  Assessment & Plan    1. Acute on chronic HF: she was hypervolemic after 6 weeks of persistent atrial fibrillation and then missed some diuretic when travelling to and from hospital for procedure. Has improved very quickly with diuretic. 2L diuresis yesterday and weight has dropped 7 lb since her DCCV. Dry weight seems to be lower now - she has lost real weight. Will try to keep weight 120-125 lb at home. Losartan added. Improved faster than anticipated, might be ready for DC this PM. Check O2 sat with activity. 2. AFib:  Maintaining A paced/sinus rhythm with 100% BiV pacing on the monitor. On low dose amiodarone and Eliquis (dose  adjusted for age and low body weight). She has a severely dilated left atrium and atrial fibrillation recurrence is likely. 3. Second deg AV block: after amio started she has high grade AV block, periods of complete heart block. 4. CRT-P:  Normal device function, 100% BiV paced. 5. Hypokalemia: replace K, recheck labs next week. 6. Amiodarone: recent normal LFTs and TSH.  If she ambulates without dyspnea or hypoxia, plan to DC later today on furosemide 60 mg PO daily and KCl 40 mEq daily, losartan 25 mg daily, continue same amio 200 mg daily and metoprolol succinate 75 mg doses. Recheck labs next Wed or Thu and office TCM visit in <2 weeks.  Signed, Thurmon Fair, MD  08/22/2016, 8:25 AM

## 2016-08-24 ENCOUNTER — Encounter (HOSPITAL_COMMUNITY): Payer: Self-pay | Admitting: Cardiovascular Disease

## 2016-08-25 ENCOUNTER — Other Ambulatory Visit: Payer: Self-pay | Admitting: Cardiovascular Disease

## 2016-08-25 NOTE — Telephone Encounter (Signed)
Patient contacted regarding discharge from [facility] on 08-22-16  Patient understands to follow up with provider croitoru on 09-04-16 at 10:20 am at [facility] Patient understands discharge instructions? yes Patient understands medications and regiment? yes Patient understands to bring all medications to this visit? yes

## 2016-08-29 LAB — CUP PACEART INCLINIC DEVICE CHECK
Battery Remaining Longevity: 30 mo
Brady Statistic AP VP Percent: 76.2 %
Brady Statistic AP VS Percent: 0.1 %
Brady Statistic AS VP Percent: 23.5 %
Date Time Interrogation Session: 20180817162141
Implantable Lead Implant Date: 20130311
Implantable Lead Location: 753860
Lead Channel Impedance Value: 399 Ohm
Lead Channel Impedance Value: 570 Ohm
Lead Channel Pacing Threshold Pulse Width: 0.4 ms
Lead Channel Pacing Threshold Pulse Width: 1 ms
Lead Channel Sensing Intrinsic Amplitude: 0.5 mV
Lead Channel Setting Pacing Amplitude: 2 V
Lead Channel Setting Pacing Amplitude: 2.75 V
Lead Channel Setting Pacing Pulse Width: 0.4 ms
Lead Channel Setting Pacing Pulse Width: 0.4 ms
MDC IDC LEAD IMPLANT DT: 20130311
MDC IDC LEAD IMPLANT DT: 20131003
MDC IDC LEAD LOCATION: 753858
MDC IDC LEAD LOCATION: 753859
MDC IDC MSMT BATTERY VOLTAGE: 2.96 V
MDC IDC MSMT LEADCHNL LV PACING THRESHOLD AMPLITUDE: 1.25 V
MDC IDC MSMT LEADCHNL RV IMPEDANCE VALUE: 456 Ohm
MDC IDC MSMT LEADCHNL RV PACING THRESHOLD AMPLITUDE: 1 V
MDC IDC PG IMPLANT DT: 20131003
MDC IDC SET LEADCHNL LV PACING AMPLITUDE: 2 V
MDC IDC SET LEADCHNL RV SENSING SENSITIVITY: 4 mV
MDC IDC STAT BRADY AS VS PERCENT: 0.2 %

## 2016-08-29 LAB — GLUCOSE, CAPILLARY: GLUCOSE-CAPILLARY: 265 mg/dL — AB (ref 65–99)

## 2016-09-01 DIAGNOSIS — I4891 Unspecified atrial fibrillation: Secondary | ICD-10-CM | POA: Diagnosis not present

## 2016-09-01 DIAGNOSIS — I480 Paroxysmal atrial fibrillation: Secondary | ICD-10-CM | POA: Diagnosis not present

## 2016-09-01 DIAGNOSIS — I509 Heart failure, unspecified: Secondary | ICD-10-CM | POA: Diagnosis not present

## 2016-09-01 DIAGNOSIS — R5383 Other fatigue: Secondary | ICD-10-CM | POA: Diagnosis not present

## 2016-09-01 DIAGNOSIS — Z681 Body mass index (BMI) 19 or less, adult: Secondary | ICD-10-CM | POA: Diagnosis not present

## 2016-09-01 DIAGNOSIS — E119 Type 2 diabetes mellitus without complications: Secondary | ICD-10-CM | POA: Diagnosis not present

## 2016-09-04 ENCOUNTER — Encounter: Payer: Self-pay | Admitting: Cardiovascular Disease

## 2016-09-04 ENCOUNTER — Ambulatory Visit (INDEPENDENT_AMBULATORY_CARE_PROVIDER_SITE_OTHER): Payer: PPO | Admitting: Cardiovascular Disease

## 2016-09-04 VITALS — BP 122/72 | HR 81 | Ht 67.0 in | Wt 123.8 lb

## 2016-09-04 DIAGNOSIS — Z7901 Long term (current) use of anticoagulants: Secondary | ICD-10-CM

## 2016-09-04 DIAGNOSIS — Z950 Presence of cardiac pacemaker: Secondary | ICD-10-CM | POA: Diagnosis not present

## 2016-09-04 DIAGNOSIS — I442 Atrioventricular block, complete: Secondary | ICD-10-CM

## 2016-09-04 DIAGNOSIS — Z5181 Encounter for therapeutic drug level monitoring: Secondary | ICD-10-CM

## 2016-09-04 DIAGNOSIS — I5042 Chronic combined systolic (congestive) and diastolic (congestive) heart failure: Secondary | ICD-10-CM

## 2016-09-04 DIAGNOSIS — I481 Persistent atrial fibrillation: Secondary | ICD-10-CM | POA: Diagnosis not present

## 2016-09-04 DIAGNOSIS — I420 Dilated cardiomyopathy: Secondary | ICD-10-CM | POA: Diagnosis not present

## 2016-09-04 DIAGNOSIS — Z79899 Other long term (current) drug therapy: Secondary | ICD-10-CM | POA: Diagnosis not present

## 2016-09-04 DIAGNOSIS — I4819 Other persistent atrial fibrillation: Secondary | ICD-10-CM

## 2016-09-04 NOTE — Progress Notes (Signed)
Patient ID: Kathleen [name], female   DOB: 04-May-1924, 83 y.o.   MRN: [id number] Patient ID: Kathleen [name], female   DOB: 08-31-1924, 83 y.o.   MRN: [id number]    Cardiology Office Note    Date:  09/04/2016   ID:  Kathleen [name], DOB 08/31/1924, MRN [id number]  PCP:  Elfredia Nevins, MD  Cardiologist:   Thurmon Fair, MD   Chief Complaint  Patient presents with  . Follow-up    History of Present Illness:  Kathleen [name] is a 83 y.o. female with long-standing nonischemic cardiomyopathy, high-grade AV block requiring permanent pacemaker therapy (upgrade to biventricular pacemaker after she developed congestive heart failure, excellent response to CRT), paroxysmal atrial fibrillation on chronic amiodarone and Eliquis therapy, hyperlipidemia, in for Persistent atrial fibrillation detected by her pacemaker.   She had an episode of heart failure exacerbation that occurred gradually and response to a lengthy episode of persistent atrial fibrillation. She underwent successful cardioversion on August 8, but the procedure was complicated by hypotension and she received intravenous fluids. She returned to the hospital the next day with symptoms of acute heart failure/pulmonary edema and improved very quickly with diuretics. Since hospital discharge she has done quite well and no longer has any problems with dyspnea at rest or with activity. She does not have palpitations. Her edema has completely resolved. Her ECG shows AV sequential pacing and interrogation of her pacemaker shows 100% biventricular pacing, that her thoracic impedance has returned to baseline and that there has been no recurrent atrial fibrillation. Her dose of amiodarone has been increased to 200 mg daily.   Past Medical History:  Diagnosis Date  . Acid reflux   . 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 CAD (coronary artery disease)   . Chronic kidney disease (CKD), stage III (moderate)   . Essential  hypertension   . History of pneumonia   . Hyperlipidemia   . Macular degeneration   . Nonischemic cardiomyopathy (HCC)   . PAF (paroxysmal atrial fibrillation) (HCC)   . Type 2 diabetes mellitus (HCC)     Past Surgical History:  Procedure Laterality Date  . Bladder tack  1970's  . CARDIAC CATHETERIZATION  10/15/2011   Normal coronaries  . CARDIOVERSION N/A 10/28/2013   Procedure: CARDIOVERSION;  Surgeon: Thurmon Fair, MD;  Location: [facility] ENDOSCOPY;  Service: Cardiovascular;  Laterality: N/A;  . CARDIOVERSION N/A 08/20/2016   Procedure: CARDIOVERSION;  Surgeon: Thurmon Fair, MD;  Location: [facility] ENDOSCOPY;  Service: Cardiovascular;  Laterality: N/A;  . CHOLECYSTECTOMY  1999  . INTRAMEDULLARY (IM) NAIL INTERTROCHANTERIC Right 11/03/2015   Procedure: INTRAMEDULLARY (IM) NAIL RIGHT INTERTROCHANTERIC HIP FRACTURE;  Surgeon: Tarry Kos, MD;  Location: [facility] OR;  Service: Orthopedics;  Laterality: Right;  . LEFT AND RIGHT HEART CATHETERIZATION WITH CORONARY ANGIOGRAM N/A 10/15/2011   Procedure: LEFT AND RIGHT HEART CATHETERIZATION WITH CORONARY ANGIOGRAM;  Surgeon: Chrystie Nose, MD;  Location: [facility] CATH LAB;  Service: Cardiovascular;  Laterality: N/A;  . NM MYOCAR PERF WALL MOTION  03/15/2009   Normal  . PACEMAKER INSERTION  10/16/2011   Medtronic  . PERMANENT PACEMAKER INSERTION N/A 03/24/2011   Procedure: PERMANENT PACEMAKER INSERTION;  Surgeon: Thurmon Fair, MD;  Location: [facility] CATH LAB;  Service: Cardiovascular;  Laterality: N/A;  . REPLACEMENT TOTAL KNEE      Current Medications: Outpatient Medications Prior to Visit  Medication Sig Dispense Refill  . acetaminophen (TYLENOL) 325 MG tablet Take 650 mg by mouth every  4 (four) hours as needed.    Marland Kitchen amiodarone (PACERONE) 200 MG tablet Take 1 tablet (200 mg total) by mouth daily. 90 tablet 3  . AMITIZA 24 MCG capsule Take 24 mcg by mouth 2 (two) times daily.    . Coenzyme Q10 (CO Q 10 PO) Take 1 tablet by mouth daily.    Marland Kitchen  DM-Doxylamine-Acetaminophen (NYQUIL COLD & FLU PO) Take 1 capsule by mouth at bedtime as needed (sleep).    Marland Kitchen ELIQUIS 2.5 MG TABS tablet TAKE ONE TABLET BY MOUTH TWICE DAILY. 60 tablet 5  . ferrous sulfate 325 (65 FE) MG tablet Take 325 mg by mouth daily with breakfast.     . furosemide (LASIX) 40 MG tablet Take 40 mg (1 tablet) daily. If weight increases by 3 lbs in 1 day or 5 lbs in 1 week, take a second 40 mg (1 tablet). 45 tablet 3  . glimepiride (AMARYL) 1 MG tablet Take 0.5 tablet (0.5 mg total) by mouth daily if CBG is >120.    Marland Kitchen losartan (COZAAR) 25 MG tablet Take 1 tablet (25 mg total) by mouth daily. 30 tablet 3  . metoprolol succinate (TOPROL-XL) 25 MG 24 hr tablet TAKE 3 TABLETS BY MOUTH DAILY. 90 tablet 1  . Multiple Vitamins-Minerals (CENTRUM SILVER 50+WOMEN PO) Take 1 tablet by mouth daily.    . Multiple Vitamins-Minerals (ICAPS AREDS 2) CAPS Take 1 capsule by mouth twice daily    . nitroGLYCERIN (NITROSTAT) 0.4 MG SL tablet DISSOLVE 1 TABLET UNDER TONGUE EVERY 5 MINUTES UP TO 15 MIN FOR CHESTPAIN. IF NO RELIEF CALL 911. 25 tablet 4  . ondansetron (ZOFRAN) 4 MG tablet Take 4 mg by mouth every 4 (four) hours as needed for nausea or vomiting.    . potassium chloride SA (K-DUR,KLOR-CON) 20 MEQ tablet Take 1 capsule (20 mEq) daily. Take an extra 1 capsule (20 mEq) on days you take an extra lasix. 45 tablet 3  . pravastatin (PRAVACHOL) 40 MG tablet TAKE 1 TABLET BY MOUTH EACH EVENING. 30 tablet 6  . feeding supplement, ENSURE ENLIVE, (ENSURE ENLIVE) LIQD Take 237 mLs by mouth 2 (two) times daily between meals. (Patient not taking: Reported on 09/04/2016) 237 mL 12   No facility-administered medications prior to visit.      Allergies:   Coumadin [warfarin sodium]; Meloxicam; Metformin and related; Morphine; Nabumetone; Oxycodone; and Sulfonamide derivatives   Social History   Social History  . Marital status: Widowed    Spouse name: N/A  . Number of children: N/A  . Years of  education: N/A   Occupational History  . retired    Social History Main Topics  . Smoking status: Never Smoker  . Smokeless tobacco: Never Used  . Alcohol use No  . Drug use: No  . Sexual activity: Not Currently   Other Topics Concern  . None   Social History Narrative  . None     Family History:  The patient's family history includes Heart attack in her brother; Heart murmur in her daughter; Suicidality in her mother.   ROS:   Please see the history of present illness.    ROS All other systems reviewed and are negative.   PHYSICAL EXAM:   VS:  BP 122/72   Pulse 81   Ht 5\' 7"  (1.702 m)   Wt 123 lb 12.8 oz (56.2 kg)   BMI 19.39 kg/m     General: Alert, oriented x3, no distress, very lean/borderline cachectic Head: no evidence of  trauma, PERRL, EOMI, no exophtalmos or lid lag, no myxedema, no xanthelasma; normal ears, nose and oropharynx Neck: normal jugular venous pulsations and no hepatojugular reflux; brisk carotid pulses without delay and no carotid bruits Chest: clear to auscultation, no signs of consolidation by percussion or palpation, normal fremitus, symmetrical and full respiratory excursions Cardiovascular: normal position and quality of the apical impulse, regular rhythm, normal first and paradoxically split second heart sounds, no murmurs, rubs or gallops, healthy subclavian pacemaker site Abdomen: no tenderness or distention, no masses by palpation, no abnormal pulsatility or arterial bruits, normal bowel sounds, no hepatosplenomegaly Extremities: no clubbing, cyanosis or edema; 2+ radial, ulnar and brachial pulses bilaterally; 2+ right femoral, posterior tibial and dorsalis pedis pulses; 2+ left femoral, posterior tibial and dorsalis pedis pulses; no subclavian or femoral bruits Neurological: grossly nonfocal Psych: euthymic mood, full affect  Wt Readings from Last 3 Encounters:  09/04/16 123 lb 12.8 oz (56.2 kg)  08/22/16 124 lb 12.8 oz (56.6 kg)  08/20/16  132 lb (59.9 kg)      Studies/Labs Reviewed:   EKG:  EKG is ordered today.  It shows100% AV sequential pacing with positive R waves in leads V1-V2, consistent with effective left ventricular pacing Recent Labs: 08/13/2016: ALT 16; Magnesium 2.1; TSH 4.660 08/21/2016: B Natriuretic Peptide 2,323.4; Hemoglobin 12.1; Platelets 193 08/22/2016: BUN 12; Creatinine, Ser 1.19; Potassium 3.1; Sodium 139   Lipid Panel    Component Value Date/Time   CHOL 151 04/11/2014 0820   TRIG 108 04/11/2014 0820   HDL 60 04/11/2014 0820   CHOLHDL 2.5 04/11/2014 0820   VLDL 22 04/11/2014 0820   LDLCALC 69 04/11/2014 0820      ASSESSMENT:    1. Chronic combined systolic and diastolic CHF (congestive heart failure) (HCC)   2. Cardiomyopathy, dilated, nonischemic   3. Persistent atrial fibrillation (HCC)   4. CHB (complete heart block) (HCC)   5. CRT - pacemaker Medtronic   6. Long term current use of anticoagulant   7. Encounter for monitoring amiodarone therapy      PLAN:  In order of problems listed above:  1. CHF: She appears to be euvolemic today. She is underweight due to poor food intake and we will recalibrate her "dry weight" to 123 pounds. Echo performed April 2017 shows an ejection fraction of 40-45 % and showed evidence of restrictive filling consistent with severely elevated filling pressures. She has a severely dilated left atrium and moderate mitral insufficiency, likely secondary to the cardiomyopathy and volume overload. She improved quickly with cardioversion and subsequent diuresis (her brief worsening was related to excessive IV fluids). If heart failure decompensates again despite maintaining her current weight and maintaining normal rhythm, we'll repeat an echocardiogram to reevaluate the mitral insufficiency.  2. CMP: Nonischemic. CRT responder. 3. AFib: She has had no interruption in her anticoagulation. CHADSVasc 5 (age 46, gender, HF, HTN). Will keep on the higher dose of  amiodarone long-term. 4. High grade AV block: Over the years and with added treatment with amiodarone, she has progressed to essentially complete heart block. She should be considered pacemaker dependent. 5. CRT-P: Normal function of her dual-chamber biventricular pacemaker. All lead parameters are in normal range. Thoracic impedance is acting normal. He never lost the BiV pacing, decompensation of heart failure was related to loss of "atrial kick".  6. Anticoagulation, adjusted for age and small body size.  7. Amiodarone: On long-term amiodarone. Liver and thyroid function tests least twice yearly, they were just checked before her  cardioversion. TSH was at the upper limit of normal.   Medication Adjustments/Labs and Tests Ordered: Current medicines are reviewed at length with the patient today.  Concerns regarding medicines are outlined above.  Medication changes, Labs and Tests ordered today are listed in the Patient Instructions below. Patient Instructions  Dr Royann Shivers recommends that you continue on your current medications as directed. Please refer to the Current Medication list given to you today.  Remote monitoring is used to monitor your Pacemaker of ICD from home. This monitoring reduces the number of office visits required to check your device to one time per year. It allows [place] to keep an eye on the functioning of your device to ensure it is working properly. You are scheduled for a device check from home on Wednesday, December 03, 2016. You may send your transmission at any time that day. If you have a wireless device, the transmission will be sent automatically. After your physician reviews your transmission, you will receive a postcard with your next transmission date.  Dr Royann Shivers recommends that you schedule a follow-up appointment in 6 months with a pacemaker check. You will receive a reminder letter in the mail two months in advance. If you don't receive a letter, please call our  office to schedule the follow-up appointment.  If you need a refill on your cardiac medications before your next appointment, please call your pharmacy.    Signed, Thurmon Fair, MD  09/04/2016 12:53 PM    [facility] [address] [place], [place], [place]  [address] Phone: [phone]; Fax: [phone]

## 2016-09-04 NOTE — Patient Instructions (Addendum)
Dr Royann Shivers recommends that you continue on your current medications as directed. Please refer to the Current Medication list given to you today.  Remote monitoring is used to monitor your Pacemaker of ICD from home. This monitoring reduces the number of office visits required to check your device to one time per year. It allows [place] to keep an eye on the functioning of your device to ensure it is working properly. You are scheduled for a device check from home on Wednesday, December 03, 2016. You may send your transmission at any time that day. If you have a wireless device, the transmission will be sent automatically. After your physician reviews your transmission, you will receive a postcard with your next transmission date.  Dr Royann Shivers recommends that you schedule a follow-up appointment in 6 months with a pacemaker check. You will receive a reminder letter in the mail two months in advance. If you don't receive a letter, please call our office to schedule the follow-up appointment.  If you need a refill on your cardiac medications before your next appointment, please call your pharmacy.

## 2016-09-09 ENCOUNTER — Other Ambulatory Visit: Payer: Self-pay | Admitting: Cardiovascular Disease

## 2016-09-16 ENCOUNTER — Other Ambulatory Visit: Payer: Self-pay | Admitting: Cardiovascular Disease

## 2016-10-02 ENCOUNTER — Ambulatory Visit (INDEPENDENT_AMBULATORY_CARE_PROVIDER_SITE_OTHER): Payer: Self-pay

## 2016-10-02 ENCOUNTER — Telehealth: Payer: Self-pay | Admitting: Cardiology

## 2016-10-02 DIAGNOSIS — Z950 Presence of cardiac pacemaker: Secondary | ICD-10-CM

## 2016-10-02 DIAGNOSIS — I5042 Chronic combined systolic (congestive) and diastolic (congestive) heart failure: Secondary | ICD-10-CM

## 2016-10-02 NOTE — Telephone Encounter (Signed)
Spoke with pt and reminded pt of remote transmission that is due today. Pt verbalized understanding.   

## 2016-10-02 NOTE — Progress Notes (Signed)
EPIC Encounter for ICM Monitoring  Patient Name: Kathleen [name] is a 83 y.o. female Date: 10/02/2016 Primary Care Physican: Redmond School, MD Primary Cardiologist:Croitoru Electrophysiologist: Croitoru Dry Weight:unknown Bi-V Pacing: 99.8%      Heart Failure questions reviewed, pt asymptomatic.  Patient c/o generalized weakness.   Advised if weakness increases to call office.  Hospitalized 8/9 to 8/10 for HF symptoms following cardioversion on 08/20/16 for persistent Afib.   Thoracic impedance normal.  Prescribed dosage: Furosemide 40 mg 1 tablet daily. Potassium 20 mEq 1 tablet twice a day.   Labs: 03/12/2016 Creatinine 1.16, BUN 13, Potassium 4.0, Sodium 137 11/27/2015 Creatinine 1.01, BUN 21, Potassium 3.7, Sodium 132, EGFR 48-56 11/19/2015 Creatinine 0.94, BUN 17, Potassium 3.6, Sodium 133, EGFR 53->60  11/18/2015 Creatinine 1.13, BUN 22, Potassium 4.7, Sodium 132, EGFR 42-49  11/14/2015 Creatinine 1.09, BUN 22, Potassium 3.8, Sodium 132, EGFR 44-51  11/12/2015 Creatinine 1.33, BUN 24, Potassium 3.9, Sodium 129, EGFR 35-40  Creatinine has ranged from 1.12 - 1.61 from 04/30/2015 to 11/07/2015  Recommendations: No changes.   Encouraged to call for fluid symptoms.  Follow-up plan: ICM clinic phone appointment on 10/16/2016.    Copy of ICM check sent to Dr. Sallyanne Kuster.   3 month ICM trend: 10/02/2016       1 Year ICM trend:      [name], RN 10/02/2016 2:06 PM

## 2016-10-03 NOTE — Progress Notes (Signed)
Marked improvement after return to normal rhythm MCr

## 2016-10-14 DIAGNOSIS — Z961 Presence of intraocular lens: Secondary | ICD-10-CM | POA: Diagnosis not present

## 2016-10-14 DIAGNOSIS — H353111 Nonexudative age-related macular degeneration, right eye, early dry stage: Secondary | ICD-10-CM | POA: Diagnosis not present

## 2016-10-14 DIAGNOSIS — H538 Other visual disturbances: Secondary | ICD-10-CM | POA: Diagnosis not present

## 2016-10-16 ENCOUNTER — Ambulatory Visit (INDEPENDENT_AMBULATORY_CARE_PROVIDER_SITE_OTHER): Payer: PPO

## 2016-10-16 ENCOUNTER — Telehealth: Payer: Self-pay | Admitting: Cardiology

## 2016-10-16 DIAGNOSIS — I5042 Chronic combined systolic (congestive) and diastolic (congestive) heart failure: Secondary | ICD-10-CM

## 2016-10-16 DIAGNOSIS — Z950 Presence of cardiac pacemaker: Secondary | ICD-10-CM | POA: Diagnosis not present

## 2016-10-16 NOTE — Progress Notes (Signed)
EPIC Encounter for ICM Monitoring  Patient Name: Kathleen [name] is a 83 y.o. female Date: 10/16/2016 Primary Care Physican: Redmond School, MD Primary Cardiologist:Croitoru Electrophysiologist: Croitoru Dry Weight:123 lbs Bi-V Pacing: 99.8%         Spoke with daughter, Kathleen [name]. Heart Failure questions reviewed, pt asymptomatic.   Thoracic impedance normal.  Prescribed dosage: Furosemide 40 mg 1 tablet daily. Potassium 20 mEq 1 tablet twice a day.   Labs: 08/22/2016 Creatinine 1.19, BUN 12, Potassium 3.1, Sodium 139, EGFR 39-46 08/21/2016 Creatinine 1.36, BUN 13, Potassium 3.7, Sodium 137, EGFR 33-39 08/13/2016 Creatinine 1.31, BUN 14, Potassium 3.9, Sodium 139, EGFR 36-42 03/12/2016 Creatinine 1.16, BUN 13, Potassium 4.0, Sodium 137 11/27/2015 Creatinine 1.01, BUN 21, Potassium 3.7, Sodium 132, EGFR 48-56 11/19/2015 Creatinine 0.94, BUN 17, Potassium 3.6, Sodium 133, EGFR 53->60  11/18/2015 Creatinine 1.13, BUN 22, Potassium 4.7, Sodium 132, EGFR 42-49  11/14/2015 Creatinine 1.09, BUN 22, Potassium 3.8, Sodium 132, EGFR 44-51  11/12/2015 Creatinine 1.33, BUN 24, Potassium 3.9, Sodium 129, EGFR 35-40  Creatinine has ranged from 1.12 - 1.61 from 04/30/2015 to 11/07/2015  Recommendations: No changes.  Encouraged to call for fluid symptoms.  Follow-up plan: ICM clinic phone appointment on 11/18/2016.    Copy of ICM check sent to Dr. Sallyanne Kuster.   3 month ICM trend: 10/16/2016   1 Year ICM trend:      [name], RN 10/16/2016 11:22 AM

## 2016-10-16 NOTE — Telephone Encounter (Signed)
Confirmed remote transmission w/ pt daughter.   

## 2016-11-08 ENCOUNTER — Emergency Department (HOSPITAL_COMMUNITY)
Admission: EM | Admit: 2016-11-08 | Discharge: 2016-11-08 | Disposition: A | Discharge status: Home / Self Care | Payer: PPO | Attending: Emergency Medicine | Admitting: Emergency Medicine

## 2016-11-08 ENCOUNTER — Encounter (HOSPITAL_COMMUNITY): Payer: Self-pay | Admitting: *Deleted

## 2016-11-08 ENCOUNTER — Emergency Department (HOSPITAL_COMMUNITY): Payer: PPO

## 2016-11-08 DIAGNOSIS — I5042 Chronic combined systolic (congestive) and diastolic (congestive) heart failure: Secondary | ICD-10-CM | POA: Insufficient documentation

## 2016-11-08 DIAGNOSIS — I130 Hypertensive heart and chronic kidney disease with heart failure and stage 1 through stage 4 chronic kidney disease, or unspecified chronic kidney disease: Secondary | ICD-10-CM | POA: Diagnosis not present

## 2016-11-08 DIAGNOSIS — I2510 Atherosclerotic heart disease of native coronary artery without angina pectoris: Secondary | ICD-10-CM | POA: Diagnosis not present

## 2016-11-08 DIAGNOSIS — N183 Chronic kidney disease, stage 3 (moderate): Secondary | ICD-10-CM | POA: Insufficient documentation

## 2016-11-08 DIAGNOSIS — H3530 Unspecified macular degeneration: Secondary | ICD-10-CM | POA: Insufficient documentation

## 2016-11-08 DIAGNOSIS — Z950 Presence of cardiac pacemaker: Secondary | ICD-10-CM | POA: Insufficient documentation

## 2016-11-08 DIAGNOSIS — E1122 Type 2 diabetes mellitus with diabetic chronic kidney disease: Secondary | ICD-10-CM | POA: Insufficient documentation

## 2016-11-08 DIAGNOSIS — R531 Weakness: Secondary | ICD-10-CM | POA: Diagnosis not present

## 2016-11-08 DIAGNOSIS — R079 Chest pain, unspecified: Secondary | ICD-10-CM | POA: Diagnosis not present

## 2016-11-08 DIAGNOSIS — R55 Syncope and collapse: Secondary | ICD-10-CM | POA: Diagnosis not present

## 2016-11-08 DIAGNOSIS — Z7984 Long term (current) use of oral hypoglycemic drugs: Secondary | ICD-10-CM | POA: Insufficient documentation

## 2016-11-08 DIAGNOSIS — R404 Transient alteration of awareness: Secondary | ICD-10-CM | POA: Diagnosis not present

## 2016-11-08 DIAGNOSIS — E1065 Type 1 diabetes mellitus with hyperglycemia: Secondary | ICD-10-CM | POA: Diagnosis not present

## 2016-11-08 LAB — CBC
HEMATOCRIT: 33.5 % — AB (ref 36.0–46.0)
Hemoglobin: 11 g/dL — ABNORMAL LOW (ref 12.0–15.0)
MCH: 34.3 pg — ABNORMAL HIGH (ref 26.0–34.0)
MCHC: 32.8 g/dL (ref 30.0–36.0)
MCV: 104.4 fL — AB (ref 78.0–100.0)
Platelets: 225 10*3/uL (ref 150–400)
RBC: 3.21 MIL/uL — ABNORMAL LOW (ref 3.87–5.11)
RDW: 15 % (ref 11.5–15.5)
WBC: 6.7 10*3/uL (ref 4.0–10.5)

## 2016-11-08 LAB — BASIC METABOLIC PANEL
ANION GAP: 10 (ref 5–15)
BUN: 18 mg/dL (ref 6–20)
CALCIUM: 8.7 mg/dL — AB (ref 8.9–10.3)
CHLORIDE: 98 mmol/L — AB (ref 101–111)
CO2: 27 mmol/L (ref 22–32)
Creatinine, Ser: 1.37 mg/dL — ABNORMAL HIGH (ref 0.44–1.00)
GFR calc Af Amer: 38 mL/min — ABNORMAL LOW (ref >60)
GFR calc non Af Amer: 33 mL/min — ABNORMAL LOW (ref >60)
GLUCOSE: 200 mg/dL — AB (ref 65–99)
Potassium: 3.8 mmol/L (ref 3.5–5.1)
Sodium: 135 mmol/L (ref 135–145)

## 2016-11-08 LAB — TROPONIN I: Troponin I: <0.03 ng/mL (ref <0.03)

## 2016-11-08 NOTE — ED Triage Notes (Signed)
Pt comes in by EMS from home for sudden onset of weakness today. She also had an episode of chest pain around 1045 today and it lasted 10 minutes. She took 1 nitro at home before EMS arrived. Pt has no pain at this time.

## 2016-11-08 NOTE — ED Notes (Signed)
ED Provider at bedside. 

## 2016-11-08 NOTE — ED Provider Notes (Signed)
[facility] EMERGENCY DEPARTMENT Provider Note   CSN: [id number] Arrival date & time: 11/08/16  1149     History   Chief Complaint Chief Complaint  Patient presents with  . Chest Pain    HPI Kathleen [name] is a 83 y.o. female.  The patient was in the kitchen with her daughter, making some crackers when she suddenly began to feel weak, so sat down.  She did not fall.  At that time she appeared pale to her daughter, and she complained of a headache.  She did not have chest pain, nausea, vomiting, diaphoresis, or complaint of chest pressure.  Her daughter helped her take a nitroglycerin tablet in case it was her heart.  There was no other prodrome.  There have been no recent illnesses.  She ate her usual breakfast this morning, and is now hungry.  She has not had to see her doctor recently for anything.  There are no other known modifying factors.   HPI  Past Medical History:  Diagnosis Date  . Acid reflux   . 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 CAD (coronary artery disease)   . Chronic kidney disease (CKD), stage III (moderate) (HCC)   . Essential hypertension   . History of pneumonia   . Hyperlipidemia   . Macular degeneration   . Nonischemic cardiomyopathy (HCC)   . PAF (paroxysmal atrial fibrillation) (HCC)   . Type 2 diabetes mellitus [facility](HCC)     Patient Active Problem List   Diagnosis Date Noted  . Acute on chronic combined systolic (congestive) and diastolic (congestive) heart failure (HCC) 08/21/2016  . Acute combined systolic and diastolic CHF, NYHA class 1 (HCC) 08/21/2016  . Persistent atrial fibrillation (HCC)   . Encounter for monitoring amiodarone therapy 06/08/2016  . Right hip pain   . Pressure injury of skin 11/19/2015  . Hematoma of hip, right, initial encounter 11/18/2015  . Abdominal distension   . Malnutrition of moderate degree 11/02/2015  . Closed displaced intertrochanteric fracture of right femur (HCC)   . Hip fracture  (HCC) 11/01/2015  . Acute on chronic combined systolic and diastolic CHF, NYHA class 1 (HCC) 05/02/2015  . Pain in the chest   . Chest pain 04/30/2015  . Pelvis fracture, closed, initial encounter 02/19/2015  . Distal radial epiphysitis 02/19/2015  . Hypercortisolemia (HCC) 10/03/2014  . UTI (urinary tract infection) 10/05/2013  . Atrial fibrillation with RVR (HCC) 10/04/2013  . Generalized weakness 10/04/2013  . Chronic combined systolic and diastolic CHF (congestive heart failure) (HCC) 10/20/2012  . CKD (chronic kidney disease) stage 3, GFR 30-59 ml/min (HCC) 10/20/2012  . Cardiomyopathy, dilated, nonischemic 10/14/2011  . CRT - pacemaker Medtronic 10/14/2011  . Hypokalemia 10/12/2011  . Acute renal insufficiency, Scr was WNL in April 2013 10/11/2011  . Anemia 10/11/2011  . Unstable angina (HCC) 10/11/2011  . Gait abnormality 05/13/2011  . Syncope 03/25/2011  . COPD on CXR 03/25/2011  . Coronary atherosclerosis, no significant obstructive lesions 03/25/2011  . Symptomatic advanced heart block, 2:1 AV block, RT BBB, Lt post. fas. block.(March 2013) 03/23/2011  . Paroxysmal atrial fibrillation (HCC) 03/23/2011  . Lung nodule 12/13/2010  . DM type 2 (diabetes mellitus, type 2) (HCC) 12/13/2010  . Hyperlipidemia 12/13/2010  . TOTAL KNEE FOLLOW-UP 06/20/2009  . MONONEURITIS, LEG 02/19/2009  . KNEE, ARTHRITIS, DEGEN./OSTEO 11/30/2008  . SPINAL STENOSIS 07/04/2008  . HEMARTHROSIS, LOWER LEG 05/10/2008  . Effusion of lower leg joint 05/03/2008  . KNEE  PAIN 05/03/2008  . Hypertension 05/03/2008    Past Surgical History:  Procedure Laterality Date  . Bladder tack  1970's  . CARDIAC CATHETERIZATION  10/15/2011   Normal coronaries  . CARDIOVERSION N/A 10/28/2013   Procedure: CARDIOVERSION;  Surgeon: Thurmon Fair, MD;  Location: [facility] ENDOSCOPY;  Service: Cardiovascular;  Laterality: N/A;  . CARDIOVERSION N/A 08/20/2016   Procedure: CARDIOVERSION;  Surgeon: Thurmon Fair, MD;   Location: [facility] ENDOSCOPY;  Service: Cardiovascular;  Laterality: N/A;  . CHOLECYSTECTOMY  1999  . INTRAMEDULLARY (IM) NAIL INTERTROCHANTERIC Right 11/03/2015   Procedure: INTRAMEDULLARY (IM) NAIL RIGHT INTERTROCHANTERIC HIP FRACTURE;  Surgeon: Tarry Kos, MD;  Location: [facility] OR;  Service: Orthopedics;  Laterality: Right;  . LEFT AND RIGHT HEART CATHETERIZATION WITH CORONARY ANGIOGRAM N/A 10/15/2011   Procedure: LEFT AND RIGHT HEART CATHETERIZATION WITH CORONARY ANGIOGRAM;  Surgeon: Chrystie Nose, MD;  Location: [facility] CATH LAB;  Service: Cardiovascular;  Laterality: N/A;  . NM MYOCAR PERF WALL MOTION  03/15/2009   Normal  . PACEMAKER INSERTION  10/16/2011   Medtronic  . PERMANENT PACEMAKER INSERTION N/A 03/24/2011   Procedure: PERMANENT PACEMAKER INSERTION;  Surgeon: Thurmon Fair, MD;  Location: [facility] CATH LAB;  Service: Cardiovascular;  Laterality: N/A;  . REPLACEMENT TOTAL KNEE      OB History    No data available       Home Medications    Prior to Admission medications   Medication Sig Start Date End Date Taking? Authorizing Provider  amiodarone (PACERONE) 200 MG tablet Take 1 tablet (200 mg total) by mouth daily. 07/18/16  Yes Croitoru, Mihai, MD  AMITIZA 24 MCG capsule Take 24 mcg by mouth 2 (two) times daily. 08/14/16  Yes [provider]  Coenzyme Q10 (CO Q 10 PO) Take 1 tablet by mouth daily.   Yes [provider]  ELIQUIS 2.5 MG TABS tablet TAKE ONE TABLET BY MOUTH TWICE DAILY. 09/16/16  Yes Croitoru, Mihai, MD  ferrous sulfate 325 (65 FE) MG tablet Take 325 mg by mouth daily with breakfast.    Yes [provider]  furosemide (LASIX) 40 MG tablet Take 40 mg (1 tablet) daily. If weight increases by 3 lbs in 1 day or 5 lbs in 1 week, take a second 40 mg (1 tablet). 08/22/16  Yes Duke, Roe Rutherford, PA  glimepiride (AMARYL) 1 MG tablet Take 0.5 tablet (0.5 mg total) by mouth daily if CBG is >120.   Yes [provider]  losartan (COZAAR) 25 MG tablet Take 1  tablet (25 mg total) by mouth daily. 08/23/16  Yes Duke, Roe Rutherford, PA  metoprolol succinate (TOPROL-XL) 25 MG 24 hr tablet Take 3 tablets (75 mg total) by mouth daily. 09/09/16  Yes Croitoru, Mihai, MD  Multiple Vitamins-Minerals (CENTRUM SILVER 50+WOMEN PO) Take 1 tablet by mouth daily.   Yes [provider]  Multiple Vitamins-Minerals (ICAPS AREDS 2) CAPS Take 1 capsule by mouth twice daily   Yes [provider]  nitroGLYCERIN (NITROSTAT) 0.4 MG SL tablet DISSOLVE 1 TABLET UNDER TONGUE EVERY 5 MINUTES UP TO 15 MIN FOR CHESTPAIN. IF NO RELIEF CALL 911. 12/25/14  Yes Croitoru, Mihai, MD  potassium chloride SA (K-DUR,KLOR-CON) 20 MEQ tablet Take 1 capsule (20 mEq) daily. Take an extra 1 capsule (20 mEq) on days you take an extra lasix. 08/22/16  Yes Duke, Roe Rutherford, PA  pravastatin (PRAVACHOL) 40 MG tablet TAKE 1 TABLET BY MOUTH EACH EVENING. 06/19/16  Yes Lewayne Bunting, MD  acetaminophen (TYLENOL) 325 MG  tablet Take 650 mg by mouth every 4 (four) hours as needed.    [provider]  DM-Doxylamine-Acetaminophen (NYQUIL COLD & FLU PO) Take 1 capsule by mouth at bedtime as needed (sleep).    [provider]  ondansetron (ZOFRAN) 4 MG tablet Take 4 mg by mouth every 4 (four) hours as needed for nausea or vomiting.    [provider]    Family History Family History  Problem Relation Age of Onset  . Suicidality Mother   . Heart murmur Daughter   . Heart attack Brother     Social History Social History  Substance Use Topics  . Smoking status: Never Smoker  . Smokeless tobacco: Never Used  . Alcohol use No     Allergies   Coumadin [warfarin sodium]; Meloxicam; Metformin and related; Morphine; Nabumetone; Oxycodone; and Sulfonamide derivatives   Review of Systems Review of Systems  All other systems reviewed and are negative.    Physical Exam Updated Vital Signs BP (!) 150/75   Pulse 69   Temp 97.8 F (36.6 C) (Oral)   Resp (!)  21   SpO2 95%   Physical Exam  Constitutional: She is oriented to person, place, and time. She appears well-developed.  Elderly, frail  HENT:  Head: Normocephalic and atraumatic.  Eyes: Pupils are equal, round, and reactive to light. Conjunctivae and EOM are normal.  Neck: Normal range of motion and phonation normal. Neck supple.  Cardiovascular: Normal rate and regular rhythm.   Pulmonary/Chest: Effort normal and breath sounds normal. She exhibits no tenderness.  Abdominal: Soft. She exhibits no distension. There is no tenderness. There is no guarding.  Musculoskeletal: Normal range of motion.  Neurological: She is alert and oriented to person, place, and time. She exhibits normal muscle tone.  No dysarthria or aphasia or nystagmus.  Skin: Skin is warm and dry.  Psychiatric: She has a normal mood and affect. Her behavior is normal. Judgment and thought content normal.  Nursing note and vitals reviewed.    ED Treatments / Results  Labs (all labs ordered are listed, but only abnormal results are displayed) Labs Reviewed  BASIC METABOLIC PANEL - Abnormal; Notable for the following:       Result Value   Chloride 98 (*)    Glucose, Bld 200 (*)    Creatinine, Ser 1.37 (*)    Calcium 8.7 (*)    GFR calc non Af Amer 33 (*)    GFR calc Af Amer 38 (*)    All other components within normal limits  CBC - Abnormal; Notable for the following:    RBC 3.21 (*)    Hemoglobin 11.0 (*)    HCT 33.5 (*)    MCV 104.4 (*)    MCH 34.3 (*)    All other components within normal limits  TROPONIN I    EKG  EKG Interpretation  Date/Time:  Saturday November 08 2016 11:50:14 EDT Ventricular Rate:  77 PR Interval:    QRS Duration: 146 QT Interval:  475 QTC Calculation: 538 R Axis:   -85 Text Interpretation:  Atrial-ventricular dual-paced rhythm No further analysis attempted due to paced rhythm since last tracing no significant change Confirmed by Mancel Bale [phone]) on 11/08/2016 2:42:52  PM       Radiology Dg Chest 2 View  Result Date: 11/08/2016 CLINICAL DATA:  Chest pain EXAM: CHEST  2 VIEW COMPARISON:  08/21/2016 FINDINGS: Chronic cardiomegaly. Biventricular pacer. Stable aortic contours. Chronic lung disease with reticular opacities and  hyperinflation. There is no edema, acute consolidation, effusion, or pneumothorax. IMPRESSION: 1. No acute finding when compared to prior. 2. Chronic lung disease.  Chronic cardiomegaly. Electronically Signed   By: Marnee Spring M.D.   On: 11/08/2016 12:58    Procedures Procedures (including critical care time)  Medications Ordered in ED Medications - No data to display   Initial Impression / Assessment and Plan / ED Course  I have reviewed the triage vital signs and the nursing notes.  Pertinent labs & imaging results that were available during my care of the patient were reviewed by me and considered in my medical decision making (see chart for details).      Patient Vitals for the past 24 hrs:  BP Temp Temp src Pulse Resp SpO2  11/08/16 1430 (!) 147/73 - - 69 19 94 %  11/08/16 1400 (!) 142/71 - - 70 (!) 25 96 %  11/08/16 1300 (!) 150/75 - - 69 (!) 21 95 %  11/08/16 1151 137/72 97.8 F (36.6 C) Oral 75 16 94 %   Medtronic pacemaker interrogated-pacemaker functioning normally and no events associated with the presyncope today.  3:06 PM Reevaluation with update and discussion. After initial assessment and treatment, an updated evaluation reveals she remains alert, cooperative.  Patient and family member, daughter, updated on findings and plan. Cope Marte L   Ambulation trial-ambulated easily.  Patient remains comfortable and wants to go home.  Findings discussed with daughter and patient.   Final Clinical Impressions(s) / ED Diagnoses   Final diagnoses:  Near syncope   Near syncope, symptoms transient, evaluation is reassuring.  Doubt ACS, PE or pneumonia.  Doubt metabolic instability or serious bacterial  infection.  Nursing Notes Reviewed/ Care Coordinated Applicable Imaging Reviewed Interpretation of Laboratory Data incorporated into ED treatment  The patient appears reasonably screened and/or stabilized for discharge and I doubt any other medical condition or other [facility] requiring further screening, evaluation, or treatment in the ED at this time prior to discharge.  Plan: Home Medications-continue usual medications; Home Treatments-rest, increase oral intake; return here if the recommended treatment, does not improve the symptoms; Recommended follow up-PCP checkup 1 week.   New Prescriptions New Prescriptions   No medications on file     Mancel Bale, MD 11/08/16 1558

## 2016-11-08 NOTE — Discharge Instructions (Signed)
There were no complications found after your episode of near syncope today.  Make sure that you are getting plenty of rest, eating and drinking well, and always sit down if you feel dizzy.

## 2016-11-18 ENCOUNTER — Ambulatory Visit (INDEPENDENT_AMBULATORY_CARE_PROVIDER_SITE_OTHER): Payer: PPO

## 2016-11-18 ENCOUNTER — Telehealth: Payer: Self-pay | Admitting: Cardiology

## 2016-11-18 DIAGNOSIS — I5042 Chronic combined systolic (congestive) and diastolic (congestive) heart failure: Secondary | ICD-10-CM

## 2016-11-18 DIAGNOSIS — Z950 Presence of cardiac pacemaker: Secondary | ICD-10-CM | POA: Diagnosis not present

## 2016-11-18 NOTE — Telephone Encounter (Signed)
LMOVM reminding pt to send remote transmission.   

## 2016-11-21 NOTE — Progress Notes (Signed)
EPIC Encounter for ICM Monitoring  Patient Name: Kathleen [name] is a 83 y.o. female Date: 11/21/2016 Primary Care Physican: Redmond School, MD Primary Cardiologist:Croitoru Electrophysiologist: Croitoru Dry Weight:Previous weight 123 lbs Bi-V Pacing: 99.7%                                        Attempted call to patient and daughter, [name].  Left detailed message and requested a call back.  Transmission reviewed.     Optivol: Thoracic impedance abnormal suggesting fluid accumulation since 11/16/2016 and also 10/27/2016 to 11/07/2016.  Prescribed dosage: Furosemide 40 mg 1 tablet daily. Potassium 20 mEq 1 tablet twice a day.   Labs: 11/08/2016 Creatinine 1.37, BUN 18, Potassium 3.8, Sodium 135, EGFR 33-38 08/22/2016 Creatinine 1.19, BUN 12, Potassium 3.1, Sodium 139, EGFR 39-46 08/21/2016 Creatinine 1.36, BUN 13, Potassium 3.7, Sodium 137, EGFR 33-39 08/13/2016 Creatinine 1.31, BUN 14, Potassium 3.9, Sodium 139, EGFR 36-42 03/12/2016 Creatinine 1.16, BUN 13, Potassium 4.0, Sodium 137 11/27/2015 Creatinine 1.01, BUN 21, Potassium 3.7, Sodium 132, EGFR 48-56 11/19/2015 Creatinine 0.94, BUN 17, Potassium 3.6, Sodium 133, EGFR 53->60  11/18/2015 Creatinine 1.13, BUN 22, Potassium 4.7, Sodium 132, EGFR 42-49  11/14/2015 Creatinine 1.09, BUN 22, Potassium 3.8, Sodium 132, EGFR 44-51  11/12/2015 Creatinine 1.33, BUN 24, Potassium 3.9, Sodium 129, EGFR 35-40  Creatinine has ranged from 1.12 - 1.61 from 04/30/2015 to 11/07/2015  Recommendations: NONE - Unable to reach.  Patient had ER visit on 11/08/2016 for near syncope.   Follow-up plan: ICM clinic phone appointment on 11/28/2016 to recheck fluid levels.   Copy of ICM check sent to Dr. Sallyanne Kuster.   3 month ICM trend: 11/19/2016    1 Year ICM trend:       [name], RN 11/21/2016 11:21 AM

## 2016-11-28 ENCOUNTER — Telehealth: Payer: Self-pay | Admitting: Cardiology

## 2016-11-28 ENCOUNTER — Ambulatory Visit (INDEPENDENT_AMBULATORY_CARE_PROVIDER_SITE_OTHER): Payer: Self-pay

## 2016-11-28 DIAGNOSIS — I959 Hypotension, unspecified: Secondary | ICD-10-CM | POA: Diagnosis not present

## 2016-11-28 DIAGNOSIS — E860 Dehydration: Secondary | ICD-10-CM | POA: Diagnosis not present

## 2016-11-28 DIAGNOSIS — Z681 Body mass index (BMI) 19 or less, adult: Secondary | ICD-10-CM | POA: Diagnosis not present

## 2016-11-28 DIAGNOSIS — R55 Syncope and collapse: Secondary | ICD-10-CM | POA: Diagnosis not present

## 2016-11-28 DIAGNOSIS — I2510 Atherosclerotic heart disease of native coronary artery without angina pectoris: Secondary | ICD-10-CM | POA: Diagnosis not present

## 2016-11-28 DIAGNOSIS — I5042 Chronic combined systolic (congestive) and diastolic (congestive) heart failure: Secondary | ICD-10-CM

## 2016-11-28 DIAGNOSIS — Z950 Presence of cardiac pacemaker: Secondary | ICD-10-CM

## 2016-11-28 DIAGNOSIS — Z23 Encounter for immunization: Secondary | ICD-10-CM | POA: Diagnosis not present

## 2016-11-28 DIAGNOSIS — I10 Essential (primary) hypertension: Secondary | ICD-10-CM | POA: Diagnosis not present

## 2016-11-28 NOTE — Progress Notes (Signed)
EPIC Encounter for ICM Monitoring  Patient Name: Kathleen [name] is a 83 y.o. female Date: 11/28/2016 Primary Care Physican: Redmond School, MD Primary Cardiologist:Croitoru Electrophysiologist: Croitoru Dry Weight:123 lbs Bi-V Pacing: 99.7%       Heart Failure questions reviewed, pt asymptomatic.   Thoracic impedance abnormal suggesting fluid accumulation starting 11/15/2016 but returned to normal today 11/28/2016.  Prescribed dosage: Furosemide 40 mg 1 tablet daily. Potassium 20 mEq 1 tablet twice a day.   Labs: 11/08/2016 Creatinine 1.37, BUN 18, Potassium 3.8, Sodium 135, EGFR 33-38 08/22/2016 Creatinine 1.19, BUN 12, Potassium 3.1, Sodium 139, EGFR 39-46 08/21/2016 Creatinine 1.36, BUN 13, Potassium 3.7, Sodium 137, EGFR 33-39 08/13/2016 Creatinine 1.31, BUN 14, Potassium 3.9, Sodium 139, EGFR 36-42 03/12/2016 Creatinine 1.16, BUN 13, Potassium 4.0, Sodium 137 11/27/2015 Creatinine 1.01, BUN 21, Potassium 3.7, Sodium 132, EGFR 48-56 11/19/2015 Creatinine 0.94, BUN 17, Potassium 3.6, Sodium 133, EGFR 53->60  11/18/2015 Creatinine 1.13, BUN 22, Potassium 4.7, Sodium 132, EGFR 42-49  11/14/2015 Creatinine 1.09, BUN 22, Potassium 3.8, Sodium 132, EGFR 44-51  11/12/2015 Creatinine 1.33, BUN 24, Potassium 3.9, Sodium 129, EGFR 35-40  Creatinine has ranged from 1.12 - 1.61 from 04/30/2015 to 11/07/2015  Recommendations:  No changes.  Encouraged to call for fluid symptoms.  Follow-up plan: ICM clinic phone appointment on 12/23/2016.    Copy of ICM check sent to Dr. Sallyanne Kuster.   3 month ICM trend: 11/28/2016    1 Year ICM trend:       [name], RN 11/28/2016 4:42 PM

## 2016-11-28 NOTE — Telephone Encounter (Signed)
Spoke with pt and reminded pt of remote transmission that is due today. Pt verbalized understanding.   

## 2016-12-15 ENCOUNTER — Other Ambulatory Visit: Payer: Self-pay | Admitting: Cardiology

## 2016-12-15 ENCOUNTER — Telehealth: Payer: Self-pay

## 2016-12-15 DIAGNOSIS — M79662 Pain in left lower leg: Secondary | ICD-10-CM | POA: Diagnosis not present

## 2016-12-15 MED ORDER — TRAMADOL HCL 50 MG PO TABS: 50.0000 mg | ORAL_TABLET | Freq: Three times a day (TID) | ORAL | 0 refills | Status: DC | PRN

## 2016-12-15 NOTE — Telephone Encounter (Signed)
Returned patient call as requested by voice mail.  She went to urgent care for left leg pain when trying to walk.  Pain started about 1 week ago and starts in the calf and travels up the leg when she tries to put weight on it and walk.  Urgent care did not find anything wrong and unable to provide any pain med except for Tylenol.  She said she did not call PCP because it takes a long time to get an appointment with him.  She was asking if Dr Royann Shivers might prescribe something other than Tylenol for the pain.    Advised would forward this to Dr Croitoru's office and the nurse will call her back.

## 2016-12-15 NOTE — Telephone Encounter (Signed)
Is the leg swollen?  Did anybody raise concern for bleeding in the calf?  She is already on Eliquis.  Risk of DVT should be very low.  She should avoid ibuprofen/naproxen or other NSAIDs.  Okay to give her prescription for tramadol 50 mg every 8 hours as needed for pain, no more than 20 tablets, no refills.  Still needs to try to follow-up with her primary care doctor to figure out what is going on.  Please advise her the medication can make her sleepy or dizzy and she should be cautious after she takes the first 1 or 2 doses, until she knows how she will respond to it. MCr

## 2016-12-15 NOTE — Telephone Encounter (Signed)
Called pt to relay recommendations. She states urgent care physician attributed to muscle cramp. She verbalized no concern for leg swelling, no signs of bruising, bleeding, etc. She has massaged leg for bumps and knots, states she doesn't feel anything unusual. Massage also provides some relief, as does heating pad. She's aware to avoid NSAIDS, & that she can take the tramadol prescribed by Dr. Royann Shivers as needed for pain. Potential side effects discussed in detail. The tramadol was called in to [facility] after I spoke to patient.  Pt aware to f/u w primary care for further assessment and call us if new concerns.

## 2016-12-23 ENCOUNTER — Ambulatory Visit (INDEPENDENT_AMBULATORY_CARE_PROVIDER_SITE_OTHER): Payer: PPO

## 2016-12-23 DIAGNOSIS — I5042 Chronic combined systolic (congestive) and diastolic (congestive) heart failure: Secondary | ICD-10-CM

## 2016-12-23 DIAGNOSIS — Z950 Presence of cardiac pacemaker: Secondary | ICD-10-CM | POA: Diagnosis not present

## 2016-12-24 ENCOUNTER — Other Ambulatory Visit: Payer: Self-pay | Admitting: *Deleted

## 2016-12-24 MED ORDER — LOSARTAN POTASSIUM 25 MG PO TABS: 25.0000 mg | ORAL_TABLET | Freq: Every day | ORAL | 0 refills | Status: DC

## 2016-12-29 NOTE — Progress Notes (Signed)
Thanks MCr 

## 2016-12-29 NOTE — Progress Notes (Signed)
EPIC Encounter for ICM Monitoring  Patient Name: Kathleen [name] is a 83 y.o. female Date: 12/29/2016 Primary Care Physican: Redmond School, MD Primary Cardiologist:Croitoru Electrophysiologist: Croitoru Dry Weight: Previous [id number] lbs Bi-V Pacing: 99.7%       Transmission reviewed.    Thoracic impedance normal.  Prescribed dosage: Furosemide 40 Take 40 mg (1 tablet) daily. If weight increases by 3 lbs in 1 day or 5 lbs in 1 week, take a second 40 mg (1 tablet).  Potassium 20 mEq Take 1 capsule (20 mEq) daily. Take an extra 1 capsule (20 mEq) on days you take an extra lasix.  Labs: 11/08/2016 Creatinine 1.37, BUN 18, Potassium 3.8, Sodium 135, EGFR 33-38 08/22/2016 Creatinine 1.19, BUN 12, Potassium 3.1, Sodium 139, EGFR 39-46 08/21/2016 Creatinine 1.36, BUN 13, Potassium 3.7, Sodium 137, EGFR 33-39 08/13/2016 Creatinine 1.31, BUN 14, Potassium 3.9, Sodium 139, EGFR 36-42 03/12/2016 Creatinine 1.16, BUN 13, Potassium 4.0, Sodium 137 11/27/2015 Creatinine 1.01, BUN 21, Potassium 3.7, Sodium 132, EGFR 48-56 11/19/2015 Creatinine 0.94, BUN 17, Potassium 3.6, Sodium 133, EGFR 53->60  11/18/2015 Creatinine 1.13, BUN 22, Potassium 4.7, Sodium 132, EGFR 42-49  11/14/2015 Creatinine 1.09, BUN 22, Potassium 3.8, Sodium 132, EGFR 44-51  11/12/2015 Creatinine 1.33, BUN 24, Potassium 3.9, Sodium 129, EGFR 35-40  Creatinine has ranged from 1.12 - 1.61 from 04/30/2015 to 11/07/2015  Recommendations:  No changes.    Follow-up plan: ICM clinic phone appointment on 01/26/2017.    Copy of ICM check sent to Dr. Sallyanne Kuster.   3 month ICM trend: 12/23/2016    1 Year ICM trend:       [name], RN 12/29/2016 2:13 PM

## 2017-01-26 ENCOUNTER — Telehealth: Payer: Self-pay

## 2017-01-26 NOTE — Telephone Encounter (Signed)
Attempted ICM call and line busy.
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
## 2017-02-12 NOTE — Progress Notes (Signed)
No ICM remote transmission received for 01/26/2017 and next ICM transmission scheduled for 03/09/2017.    

## 2017-02-16 ENCOUNTER — Other Ambulatory Visit: Payer: Self-pay | Admitting: Cardiology

## 2017-02-16 ENCOUNTER — Other Ambulatory Visit: Payer: Self-pay | Admitting: Cardiovascular Disease

## 2017-02-16 NOTE — Telephone Encounter (Signed)
Rx request sent to pharmacy.  

## 2017-02-25 ENCOUNTER — Other Ambulatory Visit (INDEPENDENT_AMBULATORY_CARE_PROVIDER_SITE_OTHER): Payer: Self-pay | Admitting: Internal Medicine

## 2017-03-03 DIAGNOSIS — Z681 Body mass index (BMI) 19 or less, adult: Secondary | ICD-10-CM | POA: Diagnosis not present

## 2017-03-03 DIAGNOSIS — Z1389 Encounter for screening for other disorder: Secondary | ICD-10-CM | POA: Diagnosis not present

## 2017-03-03 DIAGNOSIS — B029 Zoster without complications: Secondary | ICD-10-CM | POA: Diagnosis not present

## 2017-03-09 ENCOUNTER — Ambulatory Visit (INDEPENDENT_AMBULATORY_CARE_PROVIDER_SITE_OTHER): Payer: PPO

## 2017-03-09 ENCOUNTER — Telehealth: Payer: Self-pay | Admitting: Cardiology

## 2017-03-09 DIAGNOSIS — I5042 Chronic combined systolic (congestive) and diastolic (congestive) heart failure: Secondary | ICD-10-CM | POA: Diagnosis not present

## 2017-03-09 DIAGNOSIS — Z950 Presence of cardiac pacemaker: Secondary | ICD-10-CM

## 2017-03-09 NOTE — Telephone Encounter (Signed)
Confirmed remote transmission w/ pt daughter.   

## 2017-03-10 NOTE — Progress Notes (Signed)
Thank you. She hates taking that extra lasix when she needs it... MCr

## 2017-03-10 NOTE — Progress Notes (Signed)
EPIC Encounter for ICM Monitoring  Patient Name: Kathleen [name] is a 84 y.o. female Date: 03/10/2017 Primary Care Physican: Redmond School, MD Primary Cardiologist:Croitoru Electrophysiologist: Croitoru Dry Weight:120 lbs Bi-V Pacing: 99.5%         Heart Failure questions reviewed, pt symptomatic.  She said occasionally she has a small amount of ankle swelling.  Patient has shingles that started this past weekend and PCP prescribed medication.    Thoracic impedance normal.  Prescribed dosage: Furosemide 40 Take 40 mg (1 tablet) daily. If weight increases by 3 lbs in 1 day or 5 lbs in 1 week, take a second 40 mg (1 tablet).  Potassium 20 mEq Take 1 capsule (20 mEq) daily. Take an extra 1 capsule (20 mEq) on days you take an extra lasix.  Labs: 11/08/2016 Creatinine 1.37, BUN 18, Potassium 3.8, Sodium 135, EGFR 33-38 08/22/2016 Creatinine 1.19, BUN 12, Potassium 3.1, Sodium 139, EGFR 39-46 08/21/2016 Creatinine 1.36, BUN 13, Potassium 3.7, Sodium 137, EGFR 33-39 08/13/2016 Creatinine 1.31, BUN 14, Potassium 3.9, Sodium 139, EGFR 36-42 03/12/2016 Creatinine 1.16, BUN 13, Potassium 4.0, Sodium 137 11/27/2015 Creatinine 1.01, BUN 21, Potassium 3.7, Sodium 132, EGFR 48-56 11/19/2015 Creatinine 0.94, BUN 17, Potassium 3.6, Sodium 133, EGFR 53->60  11/18/2015 Creatinine 1.13, BUN 22, Potassium 4.7, Sodium 132, EGFR 42-49  11/14/2015 Creatinine 1.09, BUN 22, Potassium 3.8, Sodium 132, EGFR 44-51  11/12/2015 Creatinine 1.33, BUN 24, Potassium 3.9, Sodium 129, EGFR 35-40  Creatinine has ranged from 1.12 - 1.61 from 04/30/2015 to 11/07/2015  Recommendations: Advised to limit salt intake.  Advised Dr Sallyanne Kuster prescribed that she can have extra Furosemide if needed and reminded her to take extra Potassium when taking any extra Furosemide.  Encouraged to call for fluid symptoms.  Follow-up plan: ICM clinic phone appointment on 04/09/2017.  Office appointment scheduled 04/22/2017 with Dr.  Sallyanne Kuster.  Copy of ICM check sent to Dr. Sallyanne Kuster.   3 month ICM trend: 03/09/2017    1 Year ICM trend:       [name], RN 03/10/2017 10:18 AM

## 2017-03-12 IMAGING — DX DG CHEST 1V
1 series · 1 of 1 positions shown · non-contrast
Comparison: Chest x-ray 05/03/2015.

CLINICAL DATA: 89-year-old female with history of trauma from a
fall with pain in the right hip and right femur.

EXAM:
CHEST 1 VIEW

[chest ap]
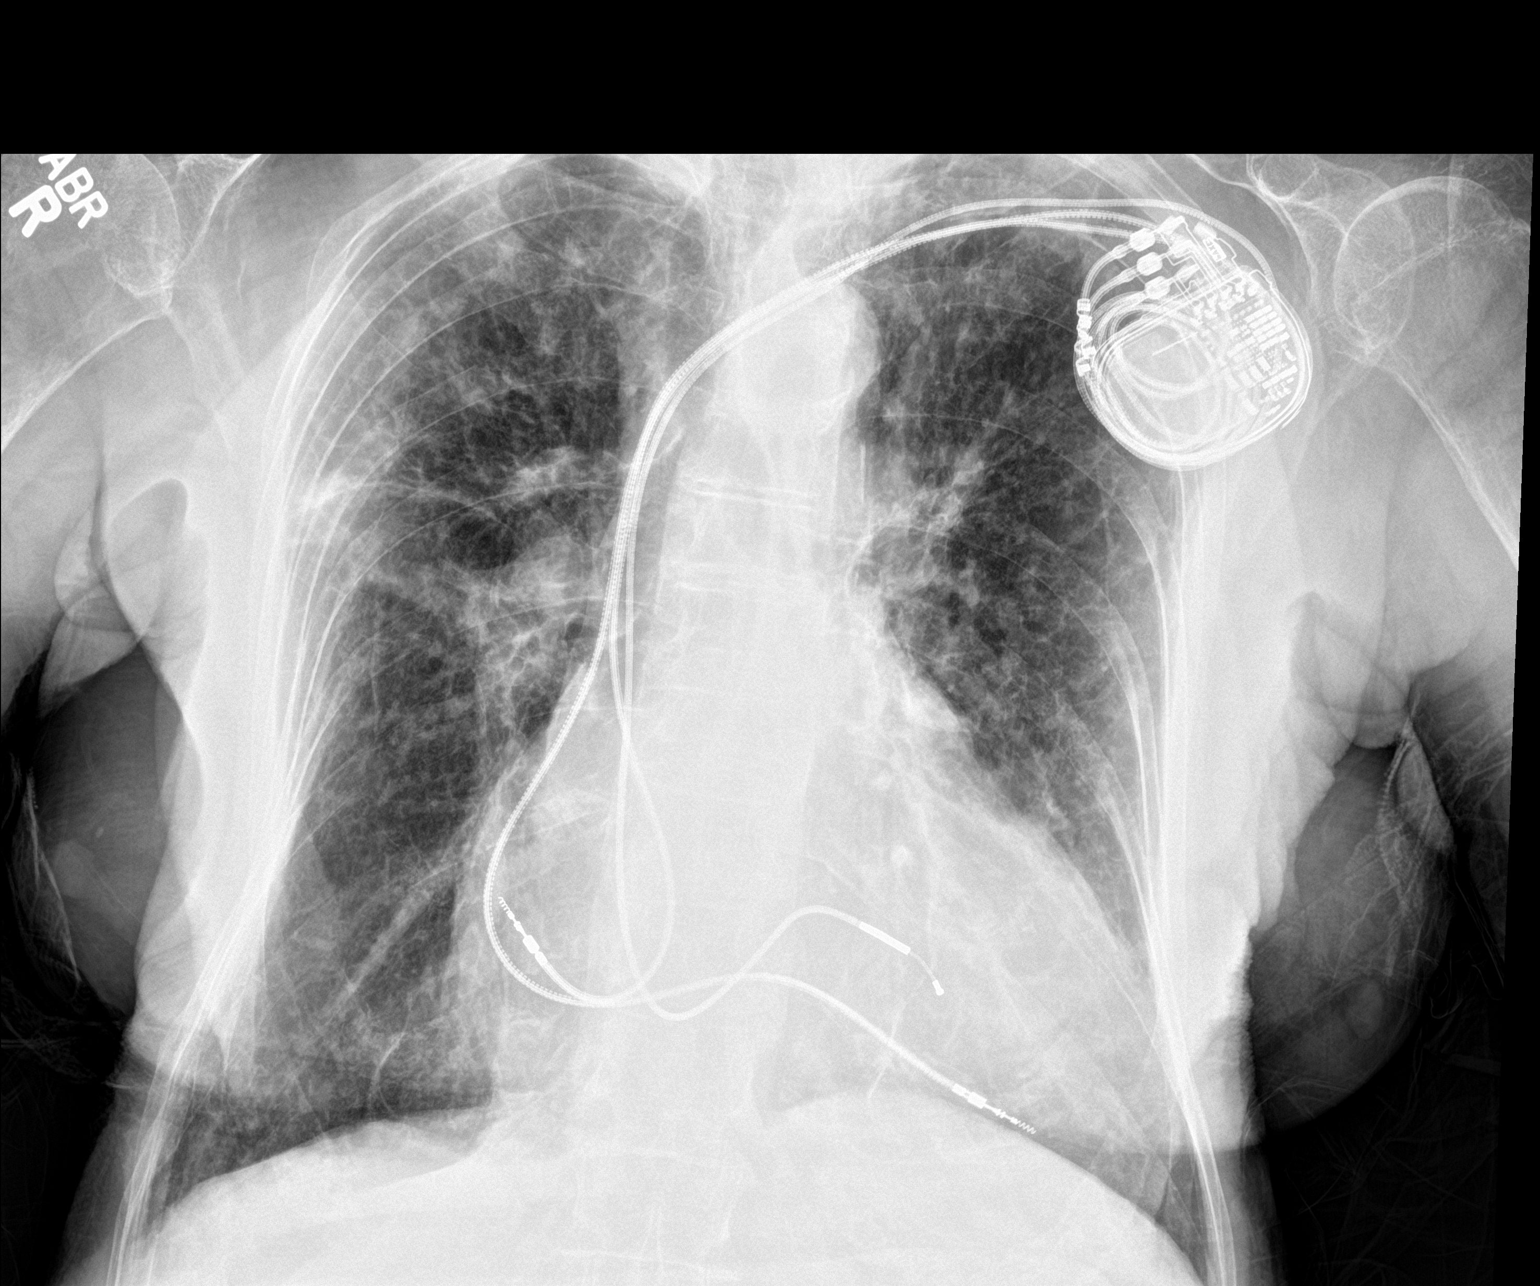

[1 of 1 positions shown; findings below may reference images not displayed]

FINDINGS: Diffuse peribronchial cuffing. Upper lobe volume loss as evidenced
by upward retraction of the minor fissure and upward retraction of
hilar structures bilaterally. Diffuse interstitial prominence, most
evident throughout the mid to upper lungs, where there is a
reticulonodular pattern which has progressively increased in
prominence over several prior examinations. Lower lungs are
relatively clear. No pleural effusions. No evidence of pulmonary
edema no pneumothorax. Heart size is mildly enlarged. Upper
mediastinal contours are within normal limits. Atherosclerosis in
the thoracic aorta. Left-sided biventricular pacemaker device in
place with lead tips projecting over the expected location of the
right atrium, right ventricle, and overlying the lateral wall the
left ventricle via the coronary sinus and coronary veins. Bony
thorax appears grossly intact.
IMPRESSION: 1. No definite evidence to suggest significant acute traumatic
injury to the thorax. Highly unusual appearance of the lungs, as
above. This appearance can be seen in the setting of sarcoidosis.
Other differential considerations include sequela of chronic
hypersensitivity pneumonitis or sequela of chronic recurrent
infections. These findings could be best evaluated with followup
nonemergent high-resolution chest CT if clinically appropriate.
2. Mild cardiomegaly.
3. Aortic atherosclerosis.

## 2017-03-12 IMAGING — DX DG FEMUR 2+V*R*
5 series · 5 of 5 positions shown · non-contrast
Comparison: None.

CLINICAL DATA: Status post fall.  Right hip pain.

EXAM:
RIGHT FEMUR 2 VIEWS

[femur ap (1 of 2)]
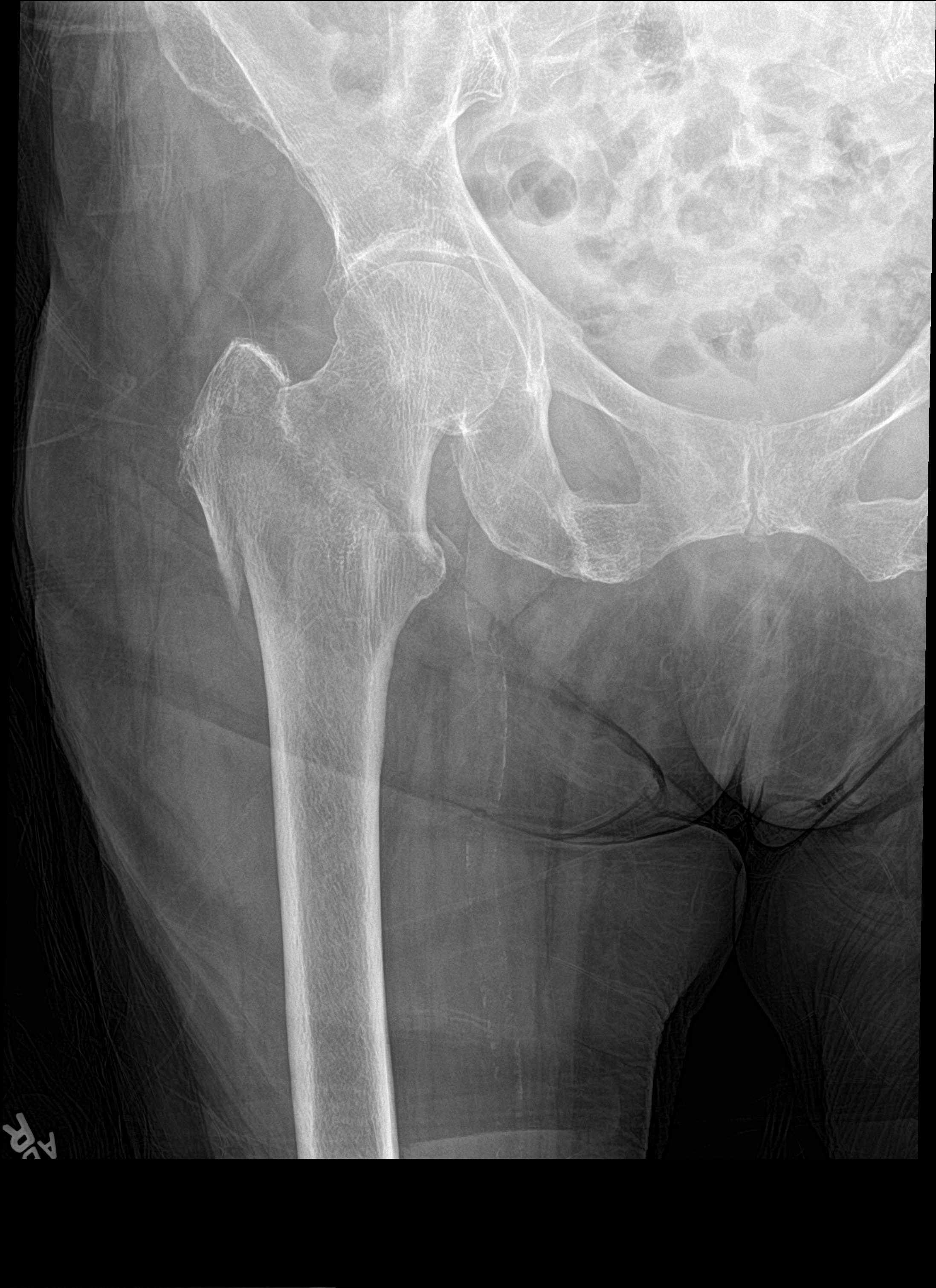

[femur lat (1 of 3)]
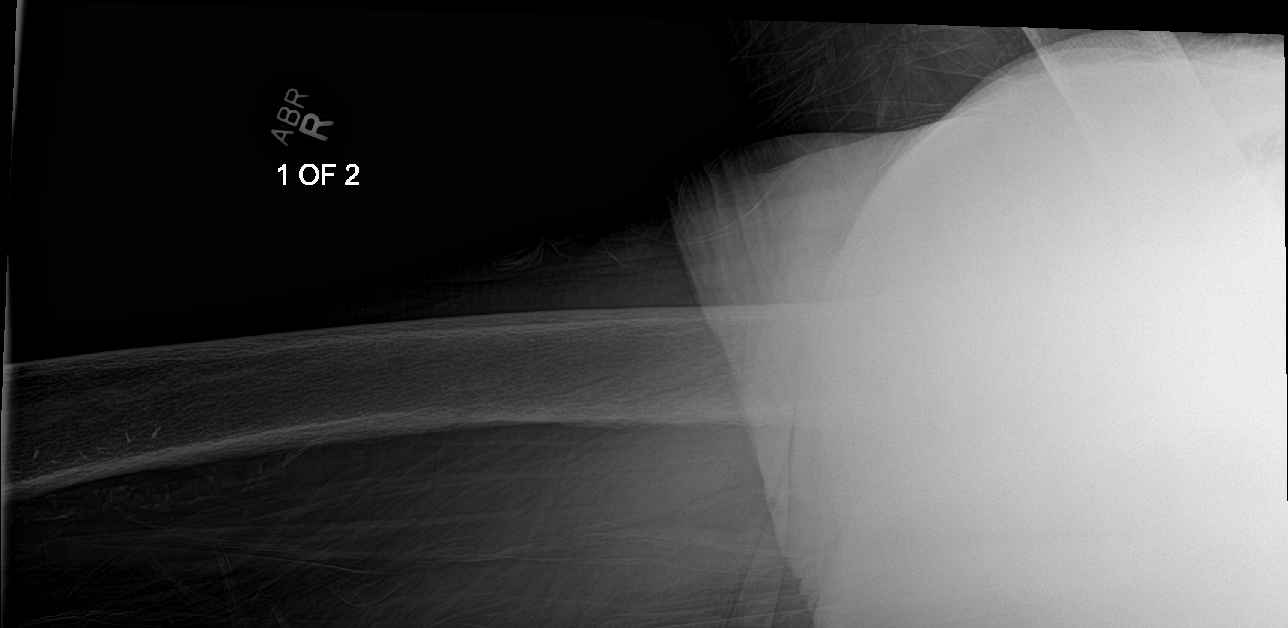

[femur ap (2 of 2)]
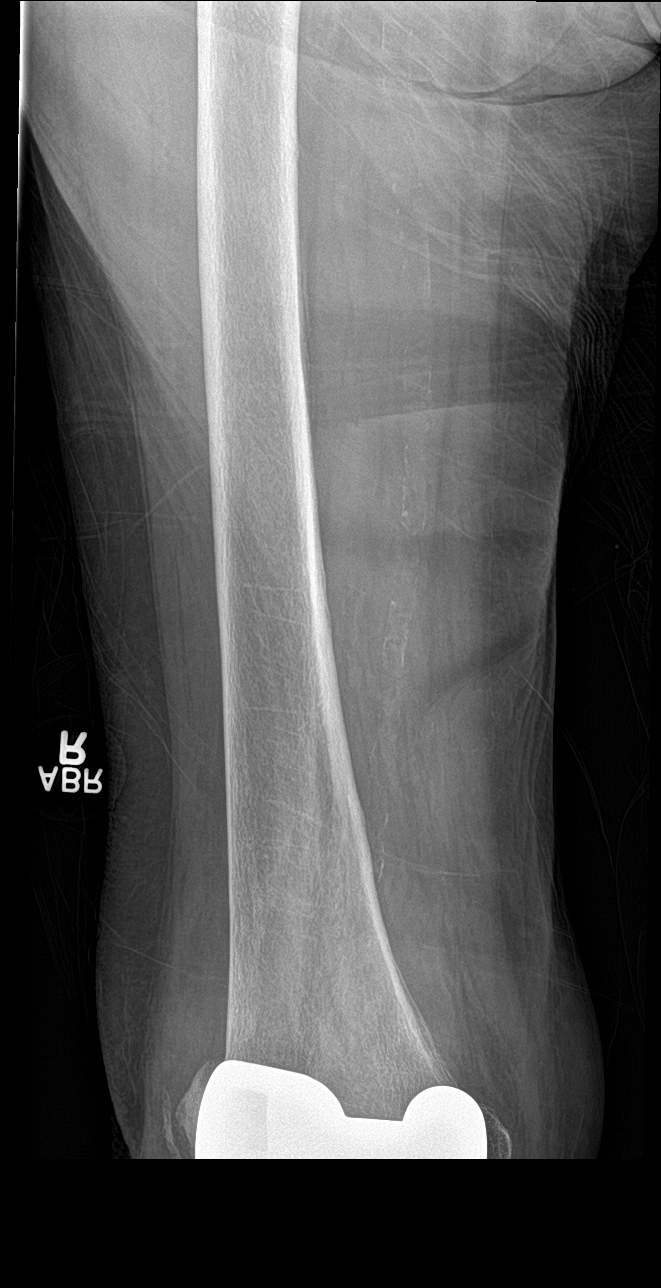

[femur lat (2 of 3)]
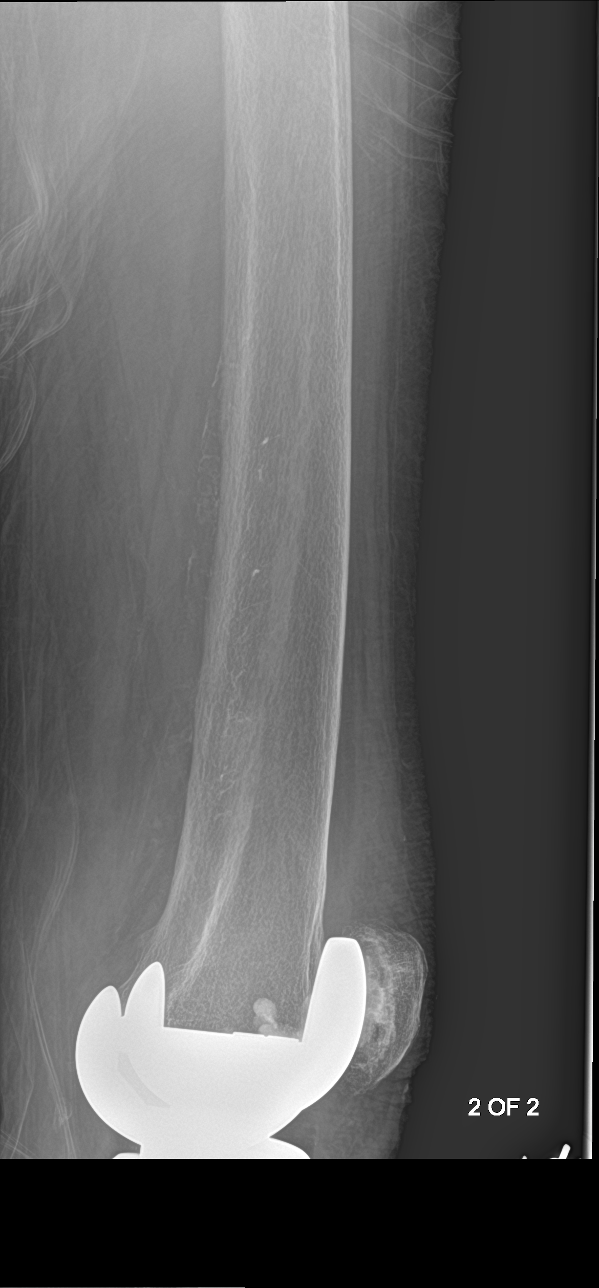

[femur lat (3 of 3)]
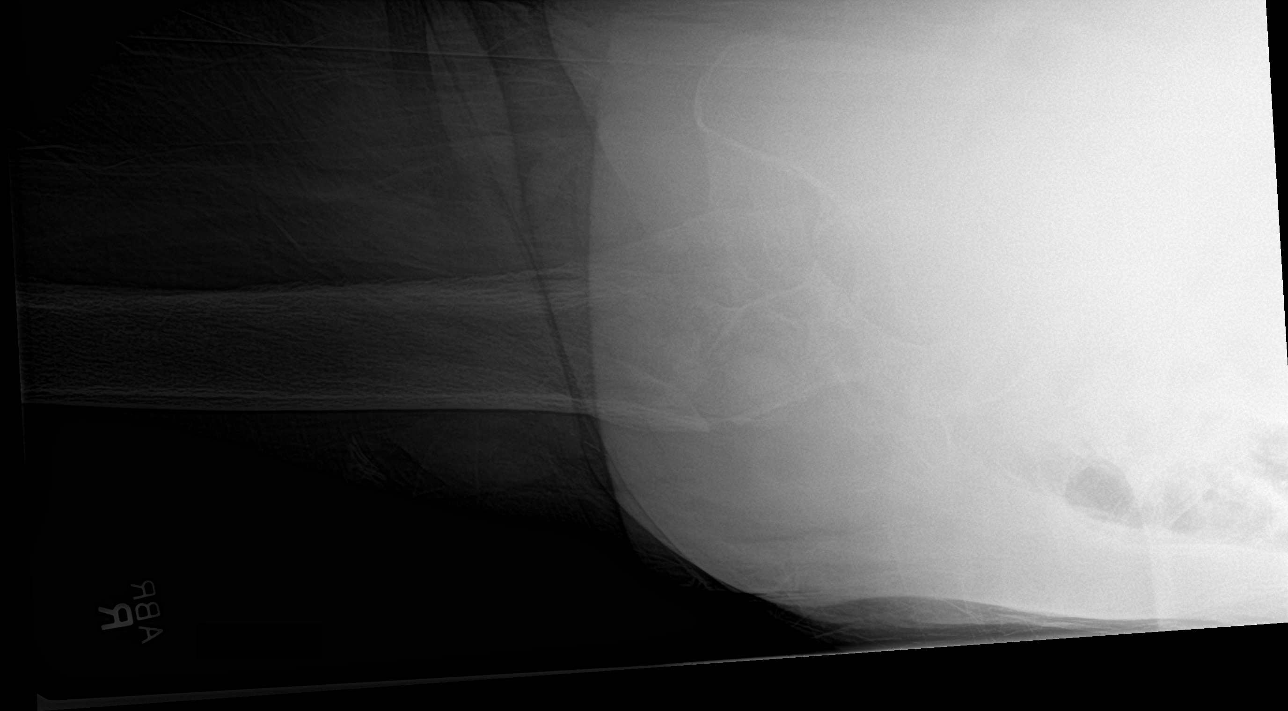

[5 of 5 positions shown; findings below may reference images not displayed]

FINDINGS: Minimally displaced right intertrochanteric hip fracture. No other
fracture or dislocation. Right knee arthroplasty. Peripheral
vascular atherosclerotic disease.
IMPRESSION: 1. Minimally displaced acute right intertrochanteric hip fracture.

## 2017-03-12 IMAGING — CT CT HIP*R* W/O CM
2 of 3 series · 17 of 46 positions shown, 19 images · non-contrast
Comparison: Radiograph dated 11/01/2015

CLINICAL DATA: 89-year-old female right hip fracture.

EXAM:
CT OF THE RIGHT HIP WITHOUT CONTRAST
TECHNIQUE: Multidetector CT imaging of the right hip was performed according to
the standard protocol. Multiplanar CT image reconstructions were
also generated.

[Series 6: coronal st · coronal · 0.28mm/px · 3 of 72 slices shown]
[im 24/72  soft-tissue]
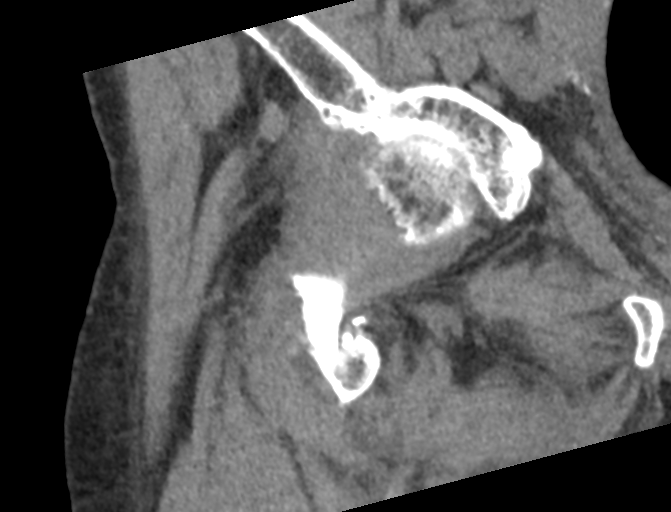
[im 32/72  soft-tissue]
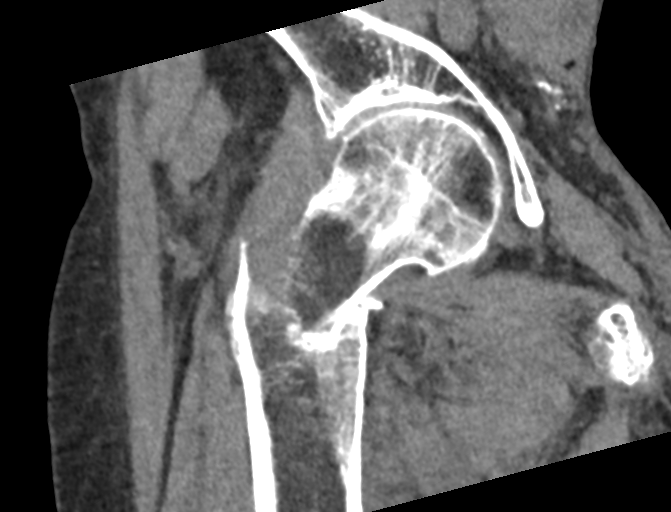
[im 40/72  soft-tissue]
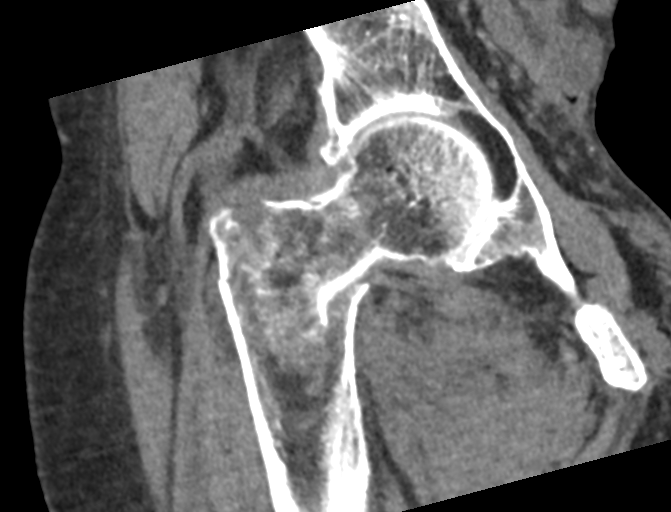

[Series 10: axial st · axial · 0.49mm/px · z∈[+791,+909]mm · 14 of 69 slices shown, 16 images]
[im 5/69  soft-tissue]
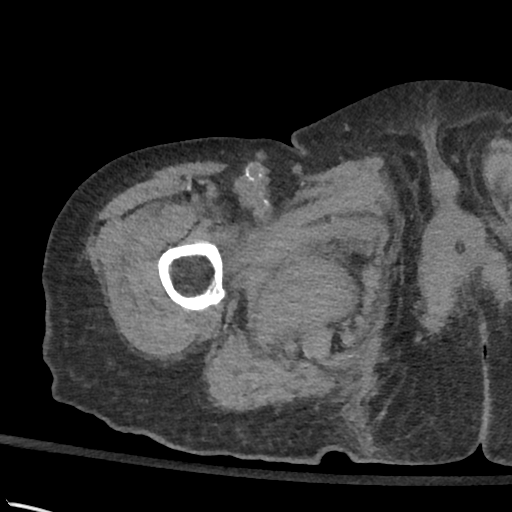
[im 5/69  bone]
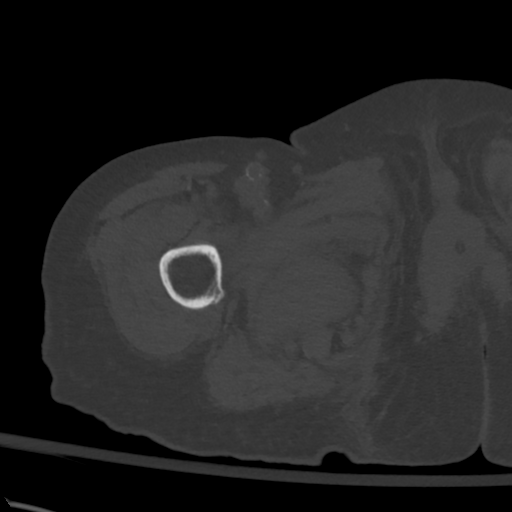
[im 9/69  soft-tissue]
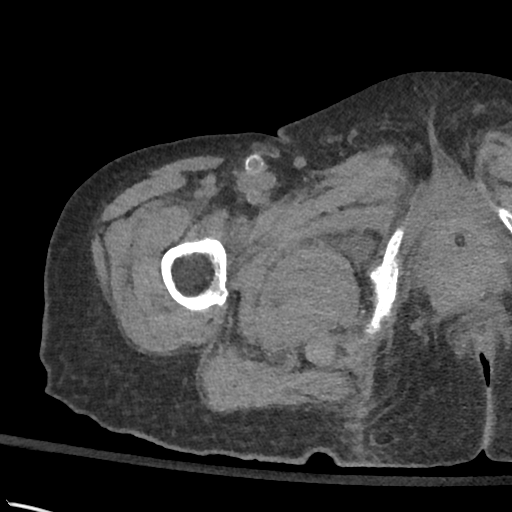
[im 14/69  soft-tissue]
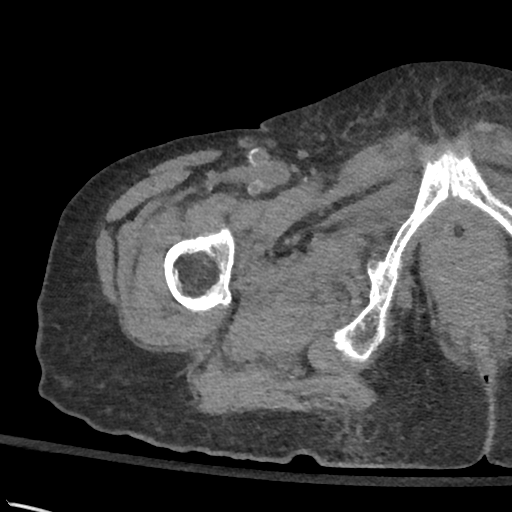
[im 18/69  soft-tissue]
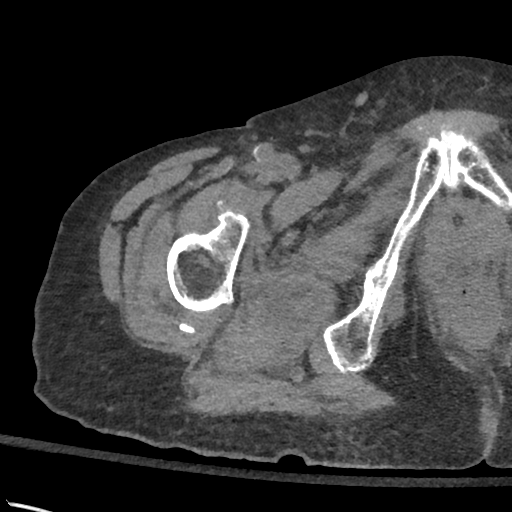
[im 22/69  soft-tissue]
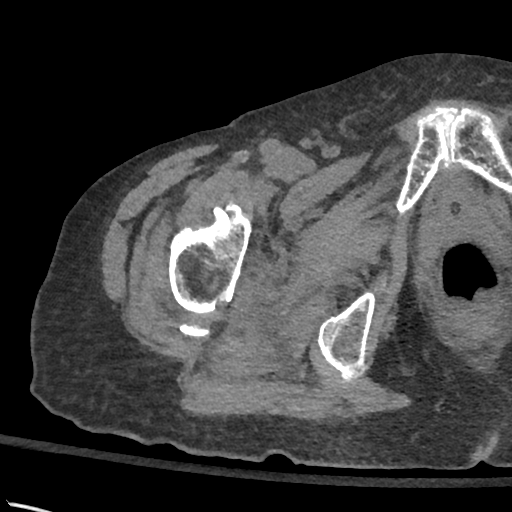
[im 27/69  soft-tissue]
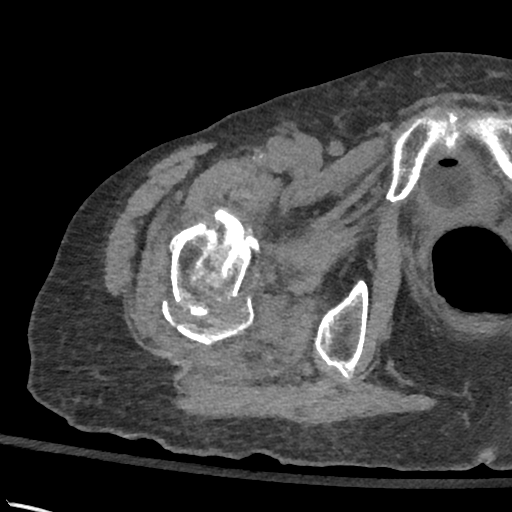
[im 31/69  soft-tissue]
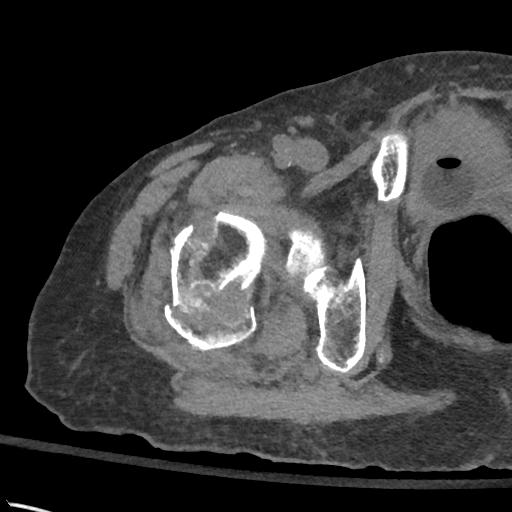
[im 38/69  soft-tissue]
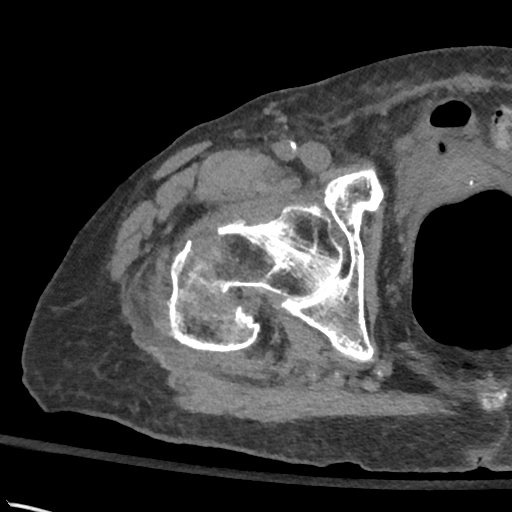
[im 42/69  soft-tissue]
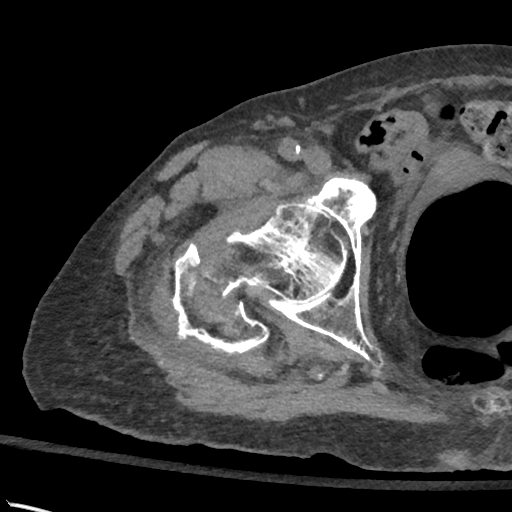
[im 42/69  bone]
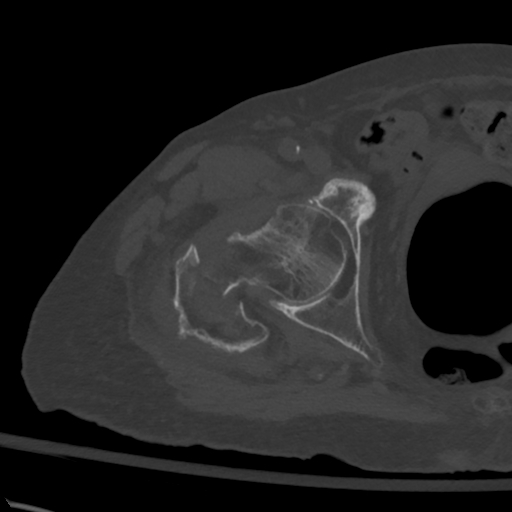
[im 47/69  soft-tissue]
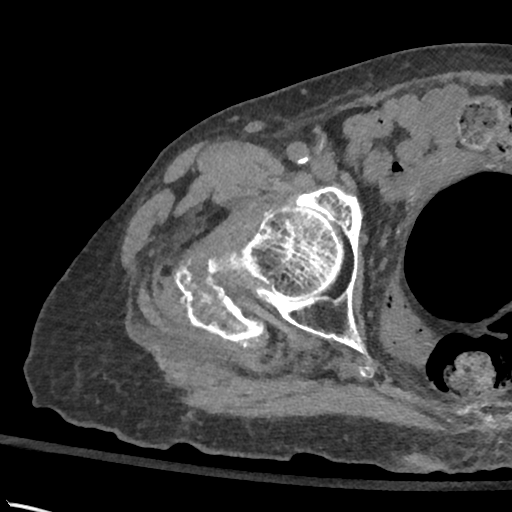
[im 51/69  soft-tissue]
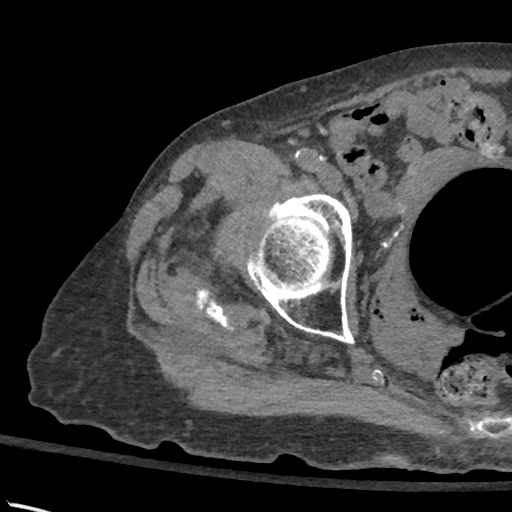
[im 55/69  soft-tissue]
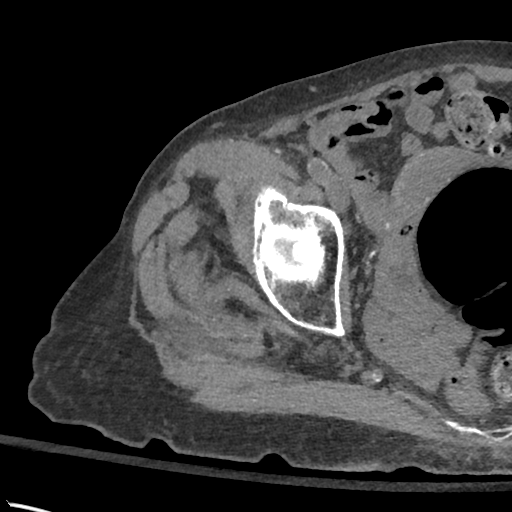
[im 60/69  soft-tissue]
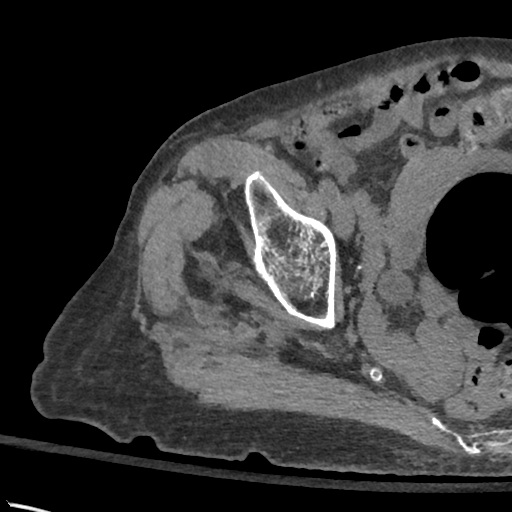
[im 64/69  soft-tissue]
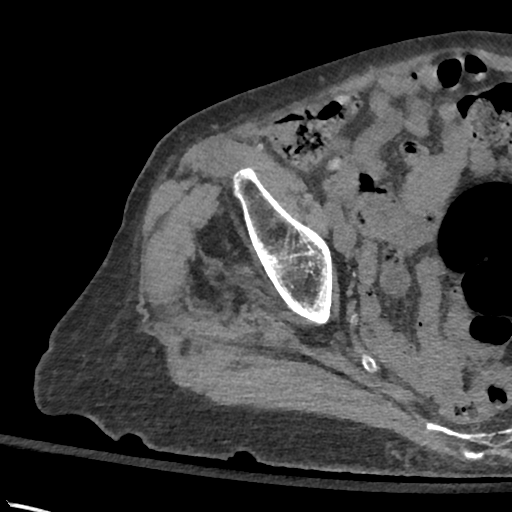

[17 of 46 positions shown; findings below may reference images not displayed]

FINDINGS: Bones/Joint/Cartilage

There is a comminuted intratrochanteric fracture of the right femur
with mild proximal migration and impaction of the femoral shaft and
the femoral neck. There is associated mild varus angulation.
Evaluation of the fracture is however limited due to advanced
osteopenia. There is mild posterior displacement of the fracture
fragment of the greater trochanter. There is no dislocation.

Ligaments

Suboptimally assessed by CT.

Muscles and Tendons

Mild edema of the right thigh musculature. No fluid collection or
hematoma.

Soft tissues

No fluid collection or hematoma.

A Foley catheter is noted within the bladder. There is scattered
colonic diverticula.
IMPRESSION: Comminuted intertrochanteric fracture of the right femur.

Advanced osteopenia.

## 2017-03-14 IMAGING — RF DG HIP (WITH PELVIS) OPERATIVE*R*
1 series · 2 of 2 positions shown · non-contrast
Comparison: 11/01/2015.

CLINICAL DATA: Operative imaging for right proximal femur fracture
ORIF.

EXAM:
DG C-ARM 61-120 MIN; OPERATIVE RIGHT HIP WITH PELVIS

[Series 1: run · 2 of 2 slices shown]
[im 1/2]
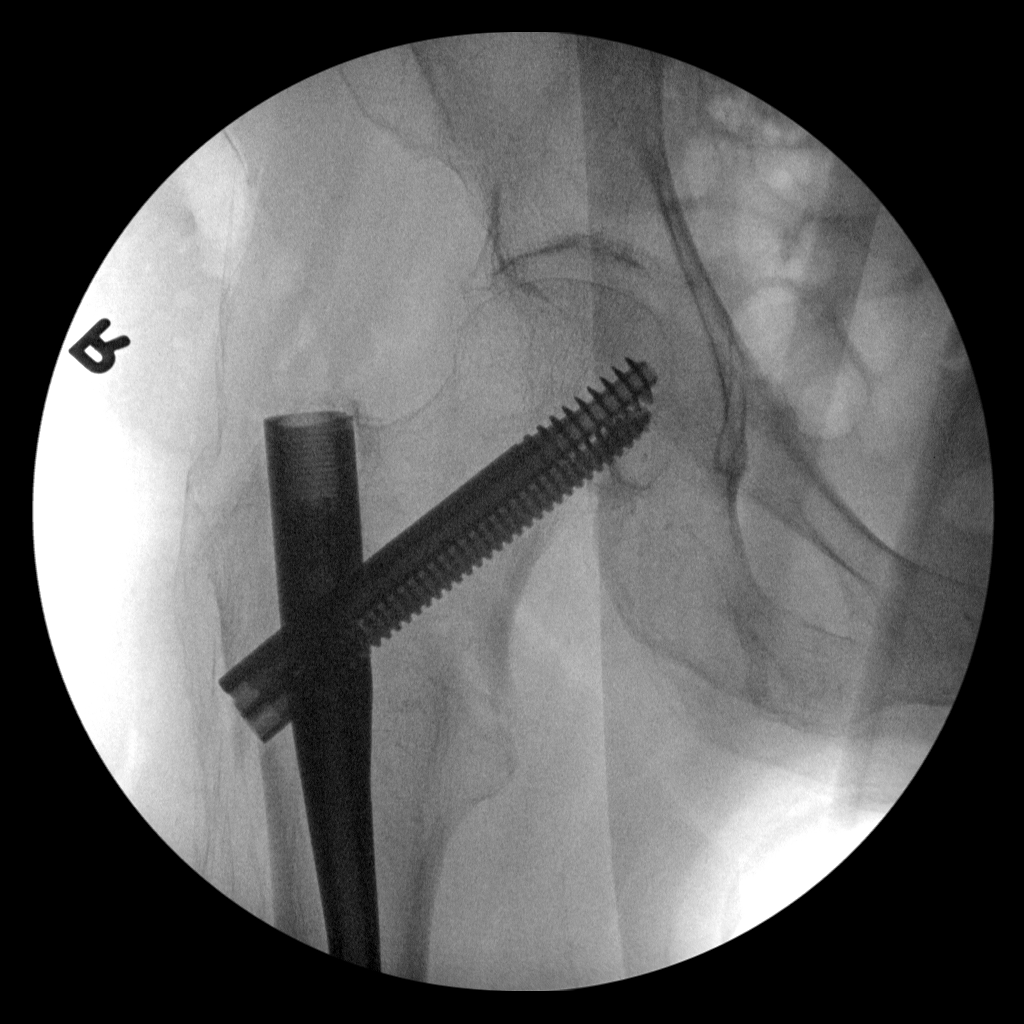
[im 2/2]
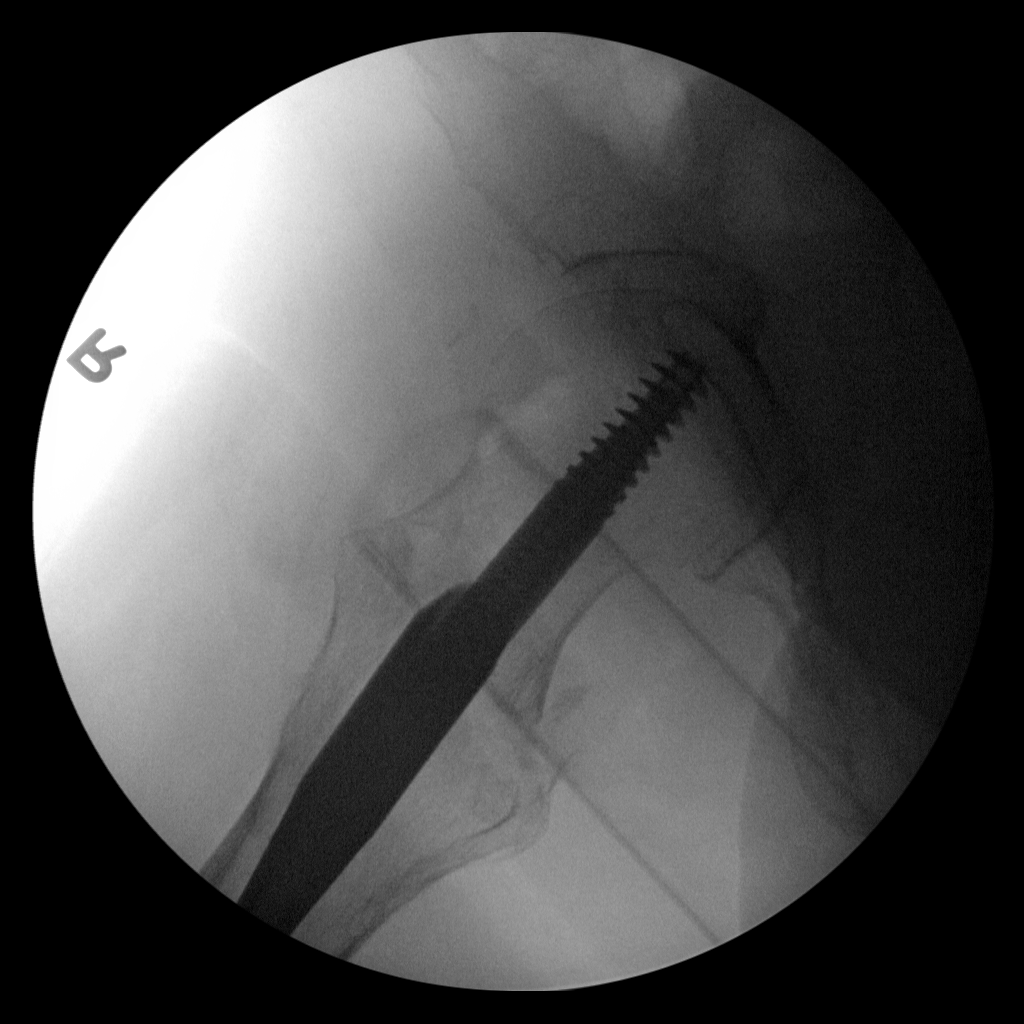

[2 of 2 positions shown; findings below may reference images not displayed]

FINDINGS: Two screws are supported by an medullary rod reducing the
intertrochanteric fracture into near anatomic alignment.

The orthopedic hardware appears well seated well aligned.
IMPRESSION: 1. Well-aligned right proximal femur intertrochanteric fracture
following ORIF.

## 2017-03-19 ENCOUNTER — Other Ambulatory Visit: Payer: Self-pay | Admitting: Cardiovascular Disease

## 2017-03-19 NOTE — Telephone Encounter (Signed)
REFILL 

## 2017-03-23 ENCOUNTER — Telehealth: Payer: Self-pay | Admitting: Cardiovascular Disease

## 2017-03-23 NOTE — Telephone Encounter (Signed)
Returned the call to the patient's daughter. She stated that for the last several days that her mother has been anxious and nauseous. She has had shingles and has been inside for 4 weeks and wonders if she may be having anxiety due to that.  Her weight is down to 117 now due to the nausea and decreased appetite. She wanted to know if Dr. Royann Shivers would prescribe an anxiety medication for her. She stated that she doesn't know what to do for her at this point. She has been advised to call her PCP but they couldn't get her in for 2 weeks.   She will be sending a remote transmission in case the way her mother feels has something to do with that. She wanted Dr. Royann Shivers to know the update and if he had any advice for her.

## 2017-03-23 NOTE — Telephone Encounter (Signed)
New Message:   Pt daughter is calling due to mother being anxious and its making her BP spike high and low and would like to know if you can prescribe her something for the anxiety.

## 2017-03-24 ENCOUNTER — Encounter (HOSPITAL_COMMUNITY): Payer: Self-pay | Admitting: *Deleted

## 2017-03-24 ENCOUNTER — Emergency Department (HOSPITAL_COMMUNITY)
Admission: EM | Admit: 2017-03-24 | Discharge: 2017-03-24 | Disposition: A | Discharge status: Home / Self Care | Payer: PPO | Attending: Emergency Medicine | Admitting: Emergency Medicine

## 2017-03-24 ENCOUNTER — Other Ambulatory Visit: Payer: Self-pay

## 2017-03-24 DIAGNOSIS — Z5321 Procedure and treatment not carried out due to patient leaving prior to being seen by health care provider: Secondary | ICD-10-CM | POA: Insufficient documentation

## 2017-03-24 DIAGNOSIS — R110 Nausea: Secondary | ICD-10-CM | POA: Diagnosis not present

## 2017-03-24 LAB — BASIC METABOLIC PANEL
Anion gap: 16 — ABNORMAL HIGH (ref 5–15)
BUN: 26 mg/dL — ABNORMAL HIGH (ref 6–20)
CALCIUM: 9.2 mg/dL (ref 8.9–10.3)
CO2: 24 mmol/L (ref 22–32)
CREATININE: 1.42 mg/dL — AB (ref 0.44–1.00)
Chloride: 93 mmol/L — ABNORMAL LOW (ref 101–111)
GFR calc non Af Amer: 31 mL/min — ABNORMAL LOW (ref >60)
GFR, EST AFRICAN AMERICAN: 36 mL/min — AB (ref >60)
GLUCOSE: 211 mg/dL — AB (ref 65–99)
Potassium: 3.8 mmol/L (ref 3.5–5.1)
Sodium: 133 mmol/L — ABNORMAL LOW (ref 135–145)

## 2017-03-24 LAB — URINALYSIS, ROUTINE W REFLEX MICROSCOPIC
Bacteria, UA: NONE SEEN
Bilirubin Urine: NEGATIVE
Glucose, UA: NEGATIVE mg/dL
Hgb urine dipstick: NEGATIVE
KETONES UR: NEGATIVE mg/dL
Nitrite: NEGATIVE
PH: 5 (ref 5.0–8.0)
Protein, ur: 100 mg/dL — AB
Specific Gravity, Urine: 1.018 (ref 1.005–1.030)

## 2017-03-24 LAB — CBC
HCT: 39.9 % (ref 36.0–46.0)
Hemoglobin: 12.8 g/dL (ref 12.0–15.0)
MCH: 33.4 pg (ref 26.0–34.0)
MCHC: 32.1 g/dL (ref 30.0–36.0)
MCV: 104.2 fL — ABNORMAL HIGH (ref 78.0–100.0)
PLATELETS: 235 10*3/uL (ref 150–400)
RBC: 3.83 MIL/uL — ABNORMAL LOW (ref 3.87–5.11)
RDW: 17.1 % — ABNORMAL HIGH (ref 11.5–15.5)
WBC: 7.3 10*3/uL (ref 4.0–10.5)

## 2017-03-24 NOTE — ED Triage Notes (Signed)
Pt c/o dry heaves and generalized weakness x 1 week. Pt reports these symptoms came on after she developed shingles. Shingles are scabbed over. Denies vomiting.

## 2017-03-24 NOTE — ED Notes (Signed)
Called no answer

## 2017-03-25 NOTE — Telephone Encounter (Signed)
Unfortunately, I feel that anxiety meds may not be right solution and may make things worse. MCr

## 2017-03-25 NOTE — Telephone Encounter (Signed)
Returned the call to the daughter. She has been made aware of Dr. Erin Hearing recommendation. She did state that she took her mother to the ED yesterday for dry heaves. She has been advised to follow up with her PCP. She will call and make an appointment for her mother.

## 2017-03-26 DIAGNOSIS — I129 Hypertensive chronic kidney disease with stage 1 through stage 4 chronic kidney disease, or unspecified chronic kidney disease: Secondary | ICD-10-CM | POA: Diagnosis not present

## 2017-03-26 DIAGNOSIS — N183 Chronic kidney disease, stage 3 (moderate): Secondary | ICD-10-CM | POA: Diagnosis not present

## 2017-03-26 DIAGNOSIS — I10 Essential (primary) hypertension: Secondary | ICD-10-CM | POA: Diagnosis not present

## 2017-03-26 DIAGNOSIS — I82409 Acute embolism and thrombosis of unspecified deep veins of unspecified lower extremity: Secondary | ICD-10-CM | POA: Diagnosis not present

## 2017-03-26 DIAGNOSIS — J441 Chronic obstructive pulmonary disease with (acute) exacerbation: Secondary | ICD-10-CM | POA: Diagnosis not present

## 2017-03-26 DIAGNOSIS — E1129 Type 2 diabetes mellitus with other diabetic kidney complication: Secondary | ICD-10-CM | POA: Diagnosis not present

## 2017-03-26 DIAGNOSIS — E782 Mixed hyperlipidemia: Secondary | ICD-10-CM | POA: Diagnosis not present

## 2017-03-26 DIAGNOSIS — J309 Allergic rhinitis, unspecified: Secondary | ICD-10-CM | POA: Diagnosis not present

## 2017-03-26 DIAGNOSIS — Z681 Body mass index (BMI) 19 or less, adult: Secondary | ICD-10-CM | POA: Diagnosis not present

## 2017-04-07 ENCOUNTER — Ambulatory Visit (INDEPENDENT_AMBULATORY_CARE_PROVIDER_SITE_OTHER): Payer: PPO | Admitting: Internal Medicine

## 2017-04-09 ENCOUNTER — Telehealth: Payer: Self-pay

## 2017-04-09 ENCOUNTER — Ambulatory Visit (INDEPENDENT_AMBULATORY_CARE_PROVIDER_SITE_OTHER): Payer: PPO

## 2017-04-09 DIAGNOSIS — I5042 Chronic combined systolic (congestive) and diastolic (congestive) heart failure: Secondary | ICD-10-CM | POA: Diagnosis not present

## 2017-04-09 DIAGNOSIS — Z950 Presence of cardiac pacemaker: Secondary | ICD-10-CM | POA: Diagnosis not present

## 2017-04-09 NOTE — Progress Notes (Signed)
I'm sorry she is not well. Agree with recommendations. Thanks [employer]

## 2017-04-09 NOTE — Progress Notes (Signed)
EPIC Encounter for ICM Monitoring  Patient Name: Kathleen [name] is a 84 y.o. female Date: 04/09/2017 Primary Care Physican: Redmond School, MD Primary Cardiologist:Croitoru Electrophysiologist: Croitoru Dry Weight:127 lbs (baseline is 120 lbs) Bi-V Pacing: 99.8%      Spoke with daughter, Kathleen [name].  Patient has shingles and experiencing a lot of pain. She was at ER 3/12 due to nausea and vomiting.  She is still experiencing shingles pain but also swollen legs, 8 lb weight gain in last couple of weeks, oxygen drops to 90% and uses fan because she seems to need more air, decreased appetite and overall weakness.          Thoracic impedance abnormal suggesting fluid accumulation starting 04/02/2017.    Prescribed dosage: Furosemide 40Take 40 mg (1 tablet) daily. If weight increases by 3 lbs in 1 day or 5 lbs in 1 week, take a second 40 mg (1 tablet).Potassium 20 mEqTake 1 capsule (20 mEq) daily. Take an extra 1 capsule (20 mEq) on days you take an extra lasix.  Labs: 11/08/2016 Creatinine 1.37, BUN 18, Potassium 3.8, Sodium 135, EGFR 33-38 08/22/2016 Creatinine 1.19, BUN 12, Potassium 3.1, Sodium 139, EGFR 39-46 08/21/2016 Creatinine 1.36, BUN 13, Potassium 3.7, Sodium 137, EGFR 33-39 08/13/2016 Creatinine 1.31, BUN 14, Potassium 3.9, Sodium 139, EGFR 36-42 03/12/2016 Creatinine 1.16, BUN 13, Potassium 4.0, Sodium 137 11/27/2015 Creatinine 1.01, BUN 21, Potassium 3.7, Sodium 132, EGFR 48-56 11/19/2015 Creatinine 0.94, BUN 17, Potassium 3.6, Sodium 133, EGFR 53->60  11/18/2015 Creatinine 1.13, BUN 22, Potassium 4.7, Sodium 132, EGFR 42-49  11/14/2015 Creatinine 1.09, BUN 22, Potassium 3.8, Sodium 132, EGFR 44-51  11/12/2015 Creatinine 1.33, BUN 24, Potassium 3.9, Sodium 129, EGFR 35-40  Creatinine has ranged from 1.12 - 1.61 from 04/30/2015 to 11/07/2015  Recommendations:  Patient has increased Furosemide 40 mg to 1 tablet twice a day for the last 4 days but no increase in urination.  She thinks urine output is low.    Follow-up plan: ICM clinic phone appointment on 04/16/2017 to recheck fluid levels.  Office appointment scheduled 04/22/2017 with Dr. Sallyanne Kuster.  Copy of ICM check sent to Dr. Sallyanne Kuster for review and recommendations.    3 month ICM trend: 04/09/2017    1 Year ICM trend:       [name], RN 04/09/2017 12:57 PM

## 2017-04-09 NOTE — Telephone Encounter (Signed)
Spoke with daughter and reminded pt of remote transmission that is due today. She verbalized understanding.

## 2017-04-10 NOTE — Progress Notes (Signed)
Yes, I think so. There is a hint of an improving trend at the end there [facility]

## 2017-04-10 NOTE — Progress Notes (Signed)
Call to daughter.  She reported patient has gained 2 more pounds over night for a total of 9 lbs in the last 2 week and weight this morning was 129 lbs.  Daughter stated urine output is dark and and is measuring about a cup at a time.  Legs remain swollen today and she has difficulty getting patient to elevate legs.     Advised Dr Royann Shivers recommended to take Furosemide 40 mg 1 tablet twice a day for next 3 days along with extra Potassium for 3 days.  Advised if patient's symptoms become urgent to use ER.  Instructed daughter to call back on Monday, 04/13/2017 to provide an update.    Message sent to Dr Royann Shivers asking if patient should continue to take extra Furosemide for a few more days and response was yes as seen in the note below.

## 2017-04-11 ENCOUNTER — Emergency Department (HOSPITAL_COMMUNITY): Payer: PPO

## 2017-04-11 ENCOUNTER — Inpatient Hospital Stay (HOSPITAL_COMMUNITY)
Admission: EM | Admit: 2017-04-11 | Discharge: 2017-04-17 | DRG: 291 | Disposition: A | Discharge status: Home Health | Payer: PPO | Attending: Internal Medicine | Admitting: Internal Medicine

## 2017-04-11 ENCOUNTER — Encounter (HOSPITAL_COMMUNITY): Payer: Self-pay

## 2017-04-11 ENCOUNTER — Other Ambulatory Visit: Payer: Self-pay

## 2017-04-11 DIAGNOSIS — J449 Chronic obstructive pulmonary disease, unspecified: Secondary | ICD-10-CM | POA: Diagnosis not present

## 2017-04-11 DIAGNOSIS — Z681 Body mass index (BMI) 19 or less, adult: Secondary | ICD-10-CM | POA: Diagnosis not present

## 2017-04-11 DIAGNOSIS — Z886 Allergy status to analgesic agent status: Secondary | ICD-10-CM | POA: Diagnosis not present

## 2017-04-11 DIAGNOSIS — Z888 Allergy status to other drugs, medicaments and biological substances status: Secondary | ICD-10-CM | POA: Diagnosis not present

## 2017-04-11 DIAGNOSIS — E440 Moderate protein-calorie malnutrition: Secondary | ICD-10-CM | POA: Diagnosis not present

## 2017-04-11 DIAGNOSIS — I272 Pulmonary hypertension, unspecified: Secondary | ICD-10-CM | POA: Diagnosis not present

## 2017-04-11 DIAGNOSIS — I442 Atrioventricular block, complete: Secondary | ICD-10-CM | POA: Diagnosis present

## 2017-04-11 DIAGNOSIS — E1122 Type 2 diabetes mellitus with diabetic chronic kidney disease: Secondary | ICD-10-CM | POA: Diagnosis present

## 2017-04-11 DIAGNOSIS — I5043 Acute on chronic combined systolic (congestive) and diastolic (congestive) heart failure: Secondary | ICD-10-CM | POA: Diagnosis present

## 2017-04-11 DIAGNOSIS — Z66 Do not resuscitate: Secondary | ICD-10-CM | POA: Diagnosis not present

## 2017-04-11 DIAGNOSIS — Z7984 Long term (current) use of oral hypoglycemic drugs: Secondary | ICD-10-CM

## 2017-04-11 DIAGNOSIS — Z7901 Long term (current) use of anticoagulants: Secondary | ICD-10-CM

## 2017-04-11 DIAGNOSIS — E785 Hyperlipidemia, unspecified: Secondary | ICD-10-CM | POA: Diagnosis present

## 2017-04-11 DIAGNOSIS — N183 Chronic kidney disease, stage 3 unspecified: Secondary | ICD-10-CM | POA: Diagnosis present

## 2017-04-11 DIAGNOSIS — I4891 Unspecified atrial fibrillation: Secondary | ICD-10-CM

## 2017-04-11 DIAGNOSIS — I480 Paroxysmal atrial fibrillation: Secondary | ICD-10-CM | POA: Diagnosis present

## 2017-04-11 DIAGNOSIS — R64 Cachexia: Secondary | ICD-10-CM | POA: Diagnosis present

## 2017-04-11 DIAGNOSIS — Z885 Allergy status to narcotic agent status: Secondary | ICD-10-CM | POA: Diagnosis not present

## 2017-04-11 DIAGNOSIS — J9601 Acute respiratory failure with hypoxia: Secondary | ICD-10-CM | POA: Diagnosis not present

## 2017-04-11 DIAGNOSIS — K3189 Other diseases of stomach and duodenum: Secondary | ICD-10-CM | POA: Diagnosis present

## 2017-04-11 DIAGNOSIS — R0600 Dyspnea, unspecified: Secondary | ICD-10-CM | POA: Diagnosis not present

## 2017-04-11 DIAGNOSIS — Z882 Allergy status to sulfonamides status: Secondary | ICD-10-CM

## 2017-04-11 DIAGNOSIS — I482 Chronic atrial fibrillation, unspecified: Secondary | ICD-10-CM

## 2017-04-11 DIAGNOSIS — R0602 Shortness of breath: Secondary | ICD-10-CM | POA: Diagnosis not present

## 2017-04-11 DIAGNOSIS — I428 Other cardiomyopathies: Secondary | ICD-10-CM | POA: Diagnosis present

## 2017-04-11 DIAGNOSIS — R269 Unspecified abnormalities of gait and mobility: Secondary | ICD-10-CM | POA: Diagnosis not present

## 2017-04-11 DIAGNOSIS — R627 Adult failure to thrive: Secondary | ICD-10-CM | POA: Diagnosis not present

## 2017-04-11 DIAGNOSIS — Z950 Presence of cardiac pacemaker: Secondary | ICD-10-CM

## 2017-04-11 DIAGNOSIS — I10 Essential (primary) hypertension: Secondary | ICD-10-CM | POA: Diagnosis not present

## 2017-04-11 DIAGNOSIS — Z96659 Presence of unspecified artificial knee joint: Secondary | ICD-10-CM | POA: Diagnosis not present

## 2017-04-11 DIAGNOSIS — I441 Atrioventricular block, second degree: Secondary | ICD-10-CM | POA: Diagnosis not present

## 2017-04-11 DIAGNOSIS — I130 Hypertensive heart and chronic kidney disease with heart failure and stage 1 through stage 4 chronic kidney disease, or unspecified chronic kidney disease: Secondary | ICD-10-CM | POA: Diagnosis not present

## 2017-04-11 DIAGNOSIS — I5042 Chronic combined systolic (congestive) and diastolic (congestive) heart failure: Secondary | ICD-10-CM | POA: Diagnosis not present

## 2017-04-11 DIAGNOSIS — I509 Heart failure, unspecified: Secondary | ICD-10-CM | POA: Diagnosis not present

## 2017-04-11 DIAGNOSIS — I5040 Unspecified combined systolic (congestive) and diastolic (congestive) heart failure: Secondary | ICD-10-CM | POA: Diagnosis not present

## 2017-04-11 DIAGNOSIS — J90 Pleural effusion, not elsewhere classified: Secondary | ICD-10-CM | POA: Diagnosis not present

## 2017-04-11 DIAGNOSIS — Z79899 Other long term (current) drug therapy: Secondary | ICD-10-CM

## 2017-04-11 DIAGNOSIS — E871 Hypo-osmolality and hyponatremia: Secondary | ICD-10-CM | POA: Diagnosis not present

## 2017-04-11 DIAGNOSIS — I110 Hypertensive heart disease with heart failure: Secondary | ICD-10-CM | POA: Diagnosis not present

## 2017-04-11 DIAGNOSIS — K219 Gastro-esophageal reflux disease without esophagitis: Secondary | ICD-10-CM | POA: Diagnosis present

## 2017-04-11 LAB — COMPREHENSIVE METABOLIC PANEL
ALK PHOS: 118 U/L (ref 38–126)
ALT: 24 U/L (ref 14–54)
AST: 33 U/L (ref 15–41)
Albumin: 3.5 g/dL (ref 3.5–5.0)
Anion gap: 13 (ref 5–15)
BILIRUBIN TOTAL: 1.4 mg/dL — AB (ref 0.3–1.2)
BUN: 40 mg/dL — ABNORMAL HIGH (ref 6–20)
CALCIUM: 9 mg/dL (ref 8.9–10.3)
CO2: 26 mmol/L (ref 22–32)
CREATININE: 1.42 mg/dL — AB (ref 0.44–1.00)
Chloride: 86 mmol/L — ABNORMAL LOW (ref 101–111)
GFR, EST AFRICAN AMERICAN: 36 mL/min — AB (ref >60)
GFR, EST NON AFRICAN AMERICAN: 31 mL/min — AB (ref >60)
Glucose, Bld: 211 mg/dL — ABNORMAL HIGH (ref 65–99)
Potassium: 4.5 mmol/L (ref 3.5–5.1)
Sodium: 125 mmol/L — ABNORMAL LOW (ref 135–145)
TOTAL PROTEIN: 6.6 g/dL (ref 6.5–8.1)

## 2017-04-11 LAB — CBC WITH DIFFERENTIAL/PLATELET
Basophils Absolute: 0 10*3/uL (ref 0.0–0.1)
Basophils Relative: 0 %
EOS ABS: 0 10*3/uL (ref 0.0–0.7)
Eosinophils Relative: 0 %
HEMATOCRIT: 39.7 % (ref 36.0–46.0)
HEMOGLOBIN: 13.1 g/dL (ref 12.0–15.0)
LYMPHS ABS: 0.7 10*3/uL (ref 0.7–4.0)
LYMPHS PCT: 8 %
MCH: 34.2 pg — AB (ref 26.0–34.0)
MCHC: 33 g/dL (ref 30.0–36.0)
MCV: 103.7 fL — AB (ref 78.0–100.0)
MONOS PCT: 6 %
Monocytes Absolute: 0.5 10*3/uL (ref 0.1–1.0)
Neutro Abs: 6.9 10*3/uL (ref 1.7–7.7)
Neutrophils Relative %: 86 %
Platelets: 244 10*3/uL (ref 150–400)
RBC: 3.83 MIL/uL — AB (ref 3.87–5.11)
RDW: 17.8 % — ABNORMAL HIGH (ref 11.5–15.5)
WBC: 8 10*3/uL (ref 4.0–10.5)

## 2017-04-11 LAB — GLUCOSE, CAPILLARY: Glucose-Capillary: 144 mg/dL — ABNORMAL HIGH (ref 65–99)

## 2017-04-11 LAB — BRAIN NATRIURETIC PEPTIDE: B NATRIURETIC PEPTIDE 5: 2655 pg/mL — AB (ref 0.0–100.0)

## 2017-04-11 LAB — TROPONIN I: TROPONIN I: 0.03 ng/mL — AB (ref <0.03)

## 2017-04-11 MED ORDER — ONDANSETRON HCL 4 MG PO TABS
4.0000 mg | ORAL_TABLET | ORAL | Status: DC | PRN
  Administered 2017-04-16: 4 mg via ORAL
  Filled 2017-04-11: qty 1

## 2017-04-11 MED ORDER — LIVING BETTER WITH HEART FAILURE BOOK
Freq: Once | Status: AC
  Administered 2017-04-11: 19:00:00

## 2017-04-11 MED ORDER — GLUCERNA SHAKE PO LIQD
237.0000 mL | Freq: Three times a day (TID) | ORAL | Status: DC
  Administered 2017-04-12 – 2017-04-14 (×4): 237 mL via ORAL

## 2017-04-11 MED ORDER — FUROSEMIDE 10 MG/ML IJ SOLN
40.0000 mg | Freq: Once | INTRAMUSCULAR | Status: AC
  Administered 2017-04-11: 40 mg via INTRAVENOUS
  Filled 2017-04-11: qty 4

## 2017-04-11 MED ORDER — NITROGLYCERIN 0.4 MG SL SUBL
0.4000 mg | SUBLINGUAL_TABLET | SUBLINGUAL | Status: DC | PRN
  Administered 2017-04-14: 0.4 mg via SUBLINGUAL
  Filled 2017-04-11: qty 1

## 2017-04-11 MED ORDER — PRAVASTATIN SODIUM 40 MG PO TABS
40.0000 mg | ORAL_TABLET | Freq: Every day | ORAL | Status: DC
  Administered 2017-04-11 – 2017-04-16 (×6): 40 mg via ORAL
  Filled 2017-04-11 (×6): qty 1

## 2017-04-11 MED ORDER — ENSURE ENLIVE PO LIQD
237.0000 mL | Freq: Two times a day (BID) | ORAL | Status: DC
  Administered 2017-04-13: 237 mL via ORAL

## 2017-04-11 MED ORDER — FUROSEMIDE 10 MG/ML IJ SOLN
40.0000 mg | Freq: Two times a day (BID) | INTRAMUSCULAR | Status: DC
  Administered 2017-04-11 – 2017-04-17 (×12): 40 mg via INTRAVENOUS
  Filled 2017-04-11 (×13): qty 4

## 2017-04-11 MED ORDER — APIXABAN 2.5 MG PO TABS
2.5000 mg | ORAL_TABLET | Freq: Two times a day (BID) | ORAL | Status: DC
  Administered 2017-04-12 – 2017-04-17 (×11): 2.5 mg via ORAL
  Filled 2017-04-11 (×11): qty 1

## 2017-04-11 MED ORDER — TRAMADOL HCL 50 MG PO TABS
50.0000 mg | ORAL_TABLET | Freq: Three times a day (TID) | ORAL | Status: DC | PRN
  Administered 2017-04-12 (×2): 50 mg via ORAL
  Filled 2017-04-11 (×2): qty 1

## 2017-04-11 MED ORDER — POTASSIUM CHLORIDE CRYS ER 20 MEQ PO TBCR
20.0000 meq | EXTENDED_RELEASE_TABLET | Freq: Every day | ORAL | Status: DC
  Administered 2017-04-12 – 2017-04-13 (×2): 20 meq via ORAL
  Filled 2017-04-11: qty 1
  Filled 2017-04-11: qty 2

## 2017-04-11 MED ORDER — LOSARTAN POTASSIUM 25 MG PO TABS
12.5000 mg | ORAL_TABLET | Freq: Every day | ORAL | Status: DC
  Administered 2017-04-12 – 2017-04-15 (×4): 12.5 mg via ORAL
  Filled 2017-04-11 (×4): qty 1

## 2017-04-11 MED ORDER — AMIODARONE HCL 100 MG PO TABS
50.0000 mg | ORAL_TABLET | Freq: Every day | ORAL | Status: DC
  Filled 2017-04-11 (×2): qty 1

## 2017-04-11 MED ORDER — METOPROLOL SUCCINATE ER 50 MG PO TB24
75.0000 mg | ORAL_TABLET | Freq: Every day | ORAL | Status: DC
  Administered 2017-04-12 – 2017-04-17 (×6): 75 mg via ORAL
  Filled 2017-04-11 (×6): qty 1

## 2017-04-11 MED ORDER — LUBIPROSTONE 24 MCG PO CAPS
24.0000 ug | ORAL_CAPSULE | Freq: Two times a day (BID) | ORAL | Status: DC
  Administered 2017-04-11 – 2017-04-17 (×12): 24 ug via ORAL
  Filled 2017-04-11 (×13): qty 1

## 2017-04-11 MED ORDER — FERROUS SULFATE 325 (65 FE) MG PO TABS
325.0000 mg | ORAL_TABLET | Freq: Every day | ORAL | Status: DC
  Administered 2017-04-12 – 2017-04-17 (×6): 325 mg via ORAL
  Filled 2017-04-11 (×6): qty 1

## 2017-04-11 MED ORDER — ACETAMINOPHEN 325 MG PO TABS
650.0000 mg | ORAL_TABLET | ORAL | Status: DC | PRN
  Administered 2017-04-14 – 2017-04-17 (×3): 650 mg via ORAL
  Filled 2017-04-11 (×4): qty 2

## 2017-04-11 NOTE — ED Notes (Signed)
Patient O2 88% on RA. Applied 2 LMP of O2 via [place], sats increased to 99%. Dr. Rosalia Hammers made aware.

## 2017-04-11 NOTE — ED Triage Notes (Signed)
Pt c/o shortness of breath for past few days. Has bilateral leg swelling. Hx of heart failure/COPD. Has taken lasix pta. Denies chest pain. NAD noted.

## 2017-04-11 NOTE — H&P (Signed)
History and Physical  Kathleen [name] ZOX:[id number] DOB: 1924-12-06 DOA: 04/11/2017  Referring physician: EDP PCP: Elfredia Nevins, MD   Chief Complaint: sob, dry cough  HPI: Kathleen [name] is a 84 y.o. 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 fib on Eliquis, metoprolol and amiodarone presented to the emergency room due to short of breath, ankle edema, weight gain and dry cough.  She denies of fever no chest pain.  She called cardiology office who instructed her to increase Lasix dose at home.  However she did not improve, she presented to the emergency room.  ED course: Patient is found hypoxia O2 88% on room air, she is started on 2 L of oxygen O2 improved to 99%.  She has paced rhythm, blood pressure stable ,no fever.  No leukocytosis, BMP is significant for sodium 125, elevated BNP at 2600, troponin 0 0.03.  BUN 40 creatinine 1.4, her most recent creatinine 1.2 in a few weeks ago, creatinine 1.3 and 2018).  cxr "New bilateral pleural effusions, left greater in right, and pulmonary vascular congestion compatible with CHF.  Chronic lung disease."  She is given iv Lasix 40 mg in the ED, hospitalist called to admit the patient.    Review of Systems:  Detail per HPI, Review of systems are otherwise negative  Past Medical History:  Diagnosis Date  . Acid reflux   . 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 CAD (coronary artery disease)   . Chronic kidney disease (CKD), stage III (moderate) (HCC)   . Essential hypertension   . History of pneumonia   . Hyperlipidemia   . Macular degeneration   . Nonischemic cardiomyopathy (HCC)   . PAF (paroxysmal atrial fibrillation) (HCC)   . Type 2 diabetes mellitus (HCC)    Past Surgical History:  Procedure Laterality Date  . Bladder tack   1970's  . CARDIAC CATHETERIZATION  10/15/2011   Normal coronaries  . CARDIOVERSION N/A 10/28/2013   Procedure: CARDIOVERSION;  Surgeon: Thurmon Fair, MD;  Location: [facility] ENDOSCOPY;  Service: Cardiovascular;  Laterality: N/A;  . CARDIOVERSION N/A 08/20/2016   Procedure: CARDIOVERSION;  Surgeon: Thurmon Fair, MD;  Location: [facility] ENDOSCOPY;  Service: Cardiovascular;  Laterality: N/A;  . CHOLECYSTECTOMY  1999  . INTRAMEDULLARY (IM) NAIL INTERTROCHANTERIC Right 11/03/2015   Procedure: INTRAMEDULLARY (IM) NAIL RIGHT INTERTROCHANTERIC HIP FRACTURE;  Surgeon: Tarry Kos, MD;  Location: [facility] OR;  Service: Orthopedics;  Laterality: Right;  . LEFT AND RIGHT HEART CATHETERIZATION WITH CORONARY ANGIOGRAM N/A 10/15/2011   Procedure: LEFT AND RIGHT HEART CATHETERIZATION WITH CORONARY ANGIOGRAM;  Surgeon: Chrystie Nose, MD;  Location: [facility] CATH LAB;  Service: Cardiovascular;  Laterality: N/A;  . NM MYOCAR PERF WALL MOTION  03/15/2009   Normal  . PACEMAKER INSERTION  10/16/2011   Medtronic  . PERMANENT PACEMAKER INSERTION N/A 03/24/2011   Procedure: PERMANENT PACEMAKER INSERTION;  Surgeon: Thurmon Fair, MD;  Location: [facility] CATH LAB;  Service: Cardiovascular;  Laterality: N/A;  . REPLACEMENT TOTAL KNEE     Social History:  reports that she has never smoked. She has never used smokeless tobacco. She reports that she does not drink alcohol or use drugs. Patient lives at home &  She walks with a walker, daughter reports patient has not been to the store for about a year due to weakness. No recent falls.  Allergies  Allergen Reactions  . Coumadin [Warfarin Sodium] Other (See Comments)    Caused Patient to Bleed Out.   . Meloxicam Other (See Comments)    Unknown  . Metformin And Related Other (See Comments)    Cannot have due to kidney function  . Morphine Itching    Not in right state of mind.   . Nabumetone Nausea And Vomiting  . Oxycodone Nausea And Vomiting  . Sulfonamide Derivatives Hives    Family  History  Problem Relation Age of Onset  . Suicidality Mother   . Heart murmur Daughter   . Heart attack Brother       Prior to Admission medications   Medication Sig Start Date End Date Taking? Authorizing Provider  acetaminophen (TYLENOL) 325 MG tablet Take 650 mg by mouth every 4 (four) hours as needed.    [provider]  amiodarone (PACERONE) 200 MG tablet Take 1 tablet (200 mg total) by mouth daily. 07/18/16   Croitoru, Mihai, MD  AMITIZA 24 MCG capsule TAKE (1) CAPSULE BY MOUTH TWICE DAILY. 02/25/17   Setzer, Brand Males, NP  Coenzyme Q10 (CO Q 10 PO) Take 1 tablet by mouth daily.    [provider]  DM-Doxylamine-Acetaminophen (NYQUIL COLD & FLU PO) Take 1 capsule by mouth at bedtime as needed (sleep).    [provider]  ELIQUIS 2.5 MG TABS tablet TAKE ONE TABLET BY MOUTH TWICE DAILY. 02/16/17   Croitoru, Mihai, MD  ferrous sulfate 325 (65 FE) MG tablet Take 325 mg by mouth daily with breakfast.     [provider]  furosemide (LASIX) 40 MG tablet Take 40 mg (1 tablet) daily. If weight increases by 3 lbs in 1 day or 5 lbs in 1 week, take a second 40 mg (1 tablet). 08/22/16   Duke, Roe Rutherford, PA  glimepiride (AMARYL) 1 MG tablet Take 0.5 tablet (0.5 mg total) by mouth daily if CBG is >120.    [provider]  losartan (COZAAR) 25 MG tablet TAKE 1 TABLET BY MOUTH ONCE DAILY. 03/19/17   Croitoru, Mihai, MD  metoprolol succinate (TOPROL-XL) 25 MG 24 hr tablet Take 3 tablets (75 mg total) by mouth daily. 09/09/16   Croitoru, Mihai, MD  Multiple Vitamins-Minerals (CENTRUM SILVER 50+WOMEN PO) Take 1 tablet by mouth daily.    [provider]  Multiple Vitamins-Minerals (ICAPS AREDS 2) CAPS Take 1 capsule by mouth twice daily    [provider]  nitroGLYCERIN (NITROSTAT) 0.4 MG SL tablet DISSOLVE 1 TABLET UNDER TONGUE EVERY 5 MINUTES UP TO 15 MIN FOR CHESTPAIN. IF NO RELIEF CALL 911. 12/25/14   Croitoru, Mihai, MD  ondansetron (ZOFRAN) 4 MG  tablet Take 4 mg by mouth every 4 (four) hours as needed for nausea or vomiting.    [provider]  potassium chloride SA (K-DUR,KLOR-CON) 20 MEQ tablet Take 1 capsule (20 mEq) daily. Take an extra 1 capsule (20 mEq) on days you take an extra lasix. 08/22/16   Duke, Roe Rutherford, PA  pravastatin (PRAVACHOL) 40 MG tablet TAKE 1 TABLET BY MOUTH EACH EVENING. 02/16/17   Croitoru, Mihai, MD  traMADol (ULTRAM) 50 MG tablet Take 1 tablet (50 mg total) by mouth every 8 (eight) hours as needed. 12/15/16   Croitoru, Mihai, MD  benazepril (LOTENSIN) 40 MG tablet Take 40 mg by mouth 2 (two) times daily.  03/23/11  [provider]    Physical Exam: BP 128/82   Pulse 70   Temp 97.6 F (  36.4 C) (Oral)   Resp 18   Ht 5\' 7"  (1.702 m)   Wt 53.5 kg (118 lb)   SpO2 100%   BMI 18.48 kg/m   General:  Frail elderly, NAD Eyes: PERRL ENT: unremarkable Neck: supple, no JVD Cardiovascular: paced rhythm Respiratory: bibasilar crackles Abdomen: soft/ND/ND, positive bowel sounds Skin: no rash Musculoskeletal:  Trace ankle edema bilaterally, no edema in legs Psychiatric: calm/cooperative Neurologic: no focal findings            Labs on Admission:  Basic Metabolic Panel: Recent Labs  Lab 04/11/17 1221  NA 125*  K 4.5  CL 86*  CO2 26  GLUCOSE 211*  BUN 40*  CREATININE 1.42*  CALCIUM 9.0   Liver Function Tests: Recent Labs  Lab 04/11/17 1221  AST 33  ALT 24  ALKPHOS 118  BILITOT 1.4*  PROT 6.6  ALBUMIN 3.5   No results for input(s): LIPASE, AMYLASE in the last 168 hours. No results for input(s): AMMONIA in the last 168 hours. CBC: Recent Labs  Lab 04/11/17 1221  WBC 8.0  NEUTROABS 6.9  HGB 13.1  HCT 39.7  MCV 103.7*  PLT 244   Cardiac Enzymes: Recent Labs  Lab 04/11/17 1221  TROPONINI 0.03*    BNP (last 3 results) Recent Labs    08/21/16 1459 04/11/17 1221  BNP 2,323.4* 2,655.0*    ProBNP (last 3 results) No results for input(s): PROBNP in the last  8760 hours.  CBG: No results for input(s): GLUCAP in the last 168 hours.  Radiological Exams on Admission: Dg Chest 2 View  Result Date: 04/11/2017 CLINICAL DATA:  Dyspnea. EXAM: CHEST - 2 VIEW COMPARISON:  11/08/2016. FINDINGS: Left chest wall pacer device is noted with leads in the right atrial appendage and right ventricle. Cardiac enlargement noted. Aortic atherosclerosis noted. New moderate left pleural effusion and small right pleural effusion. Pulmonary vascular congestion is identified. Chronic opacities within the right upper lung zone appears similar to previous exam. IMPRESSION: New bilateral pleural effusions, left greater in right, and pulmonary vascular congestion compatible with CHF. Chronic lung disease. Electronically Signed   By: Signa Kell M.D.   On: 04/11/2017 13:27    EKG: Independently reviewed. Paced rhythm, no acute st/t changes cxr personally reviewed as well  Assessment/Plan Present on Admission: **None**  Acute on chronic Combined chf exacerbation/acute hypoxic respiratory failure -IV Lasix 40 twice a day,  -continue toprol xl daily (she reports she takes torpol xl  twice a day, I have reviewed outpatient record , Per cardiology notes she is  prescribed toprolxl once a day)  - decrease dose of losartan for now to leave room for diuresis -Heart failure order set utilized, daily weight, strict intake and output.  Fluid restriction.  Repeat echocardiogram, ordered cardiology consult  Hyponatremia No confusion Likely from volume overload, continue Lasix  Chronic A. Fib -Currently paced rhythm, continue Lopressor and amiodarone Continue apixaban  History of high-grade AV block, post dual-chamber biventricular pacemaker Cardiology consulted  CKD 3 Creatinine seems test to progressed in the last year Creatinine 1.4 now may be new  baseline now Renal dosing meds   FTT, will need physical therapy, will need ambulate on room air, may need to discharge on  home oxygen  DVT prophylaxis: Apixaban  Consultants: Cardiology  Code Status: DNR, confirmed with daughter   Family Communication:  Patient , daughter at bedside  Disposition Plan: med tele obs  Time spent:  Albertine Grates MD, PhD [facility] Pager [phone]  [phone] If 7PM-7AM, please contact night-coverage at [email or web address], password [name]

## 2017-04-11 NOTE — ED Provider Notes (Signed)
[facility] EMERGENCY DEPARTMENT Provider Note   CSN: [id number] Arrival date & time: 04/11/17  1201     History   Chief Complaint Chief Complaint  Patient presents with  . Shortness of Breath    HPI Kathleen [name] is a 84 y.o. female.  HPI  84 yo female ho non ishcemic cardiomyopathy, chf, ckd and hypertension presents today with gradually increasing dyspnea now with three days of more severe dyspnea.  Patient with improved symptoms on nasal canula here.  States dyspnea waxing and waning at home, seemed to worsen even "just sitting there."   Symptoms worsened on exertion, sleeps with one pillow, no fever, cough worsening, nonproductive, denies pain, but has had back painin lowe part and to one side with shingles, weight up to 129 from 118.  She has increased lasix from 40 mg qday to bid.  Urine op decreased.  Drinking some fluid, decreased solids- chronic. Dr. Sherwood Gambler  Past Medical History:  Diagnosis Date  . Acid reflux   . 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 CAD (coronary artery disease)   . Chronic kidney disease (CKD), stage III (moderate) (HCC)   . Essential hypertension   . History of pneumonia   . Hyperlipidemia   . Macular degeneration   . Nonischemic cardiomyopathy (HCC)   . PAF (paroxysmal atrial fibrillation) (HCC)   . Type 2 diabetes mellitus [facility])     Patient Active Problem List   Diagnosis Date Noted  . Acute on chronic combined systolic (congestive) and diastolic (congestive) heart failure (HCC) 08/21/2016  . Acute combined systolic and diastolic CHF, NYHA class 1 (HCC) 08/21/2016  . Persistent atrial fibrillation (HCC)   . Encounter for monitoring amiodarone therapy 06/08/2016  . Right hip pain   . Pressure injury of skin 11/19/2015  . Hematoma of hip, right, initial encounter 11/18/2015  . Abdominal distension   . Malnutrition of moderate degree 11/02/2015  . Closed displaced intertrochanteric fracture of right femur (HCC)     . Hip fracture (HCC) 11/01/2015  . Acute on chronic combined systolic and diastolic CHF, NYHA class 1 (HCC) 05/02/2015  . Pain in the chest   . Chest pain 04/30/2015  . Pelvis fracture, closed, initial encounter 02/19/2015  . Distal radial epiphysitis 02/19/2015  . Hypercortisolemia (HCC) 10/03/2014  . UTI (urinary tract infection) 10/05/2013  . Atrial fibrillation with RVR (HCC) 10/04/2013  . Generalized weakness 10/04/2013  . Chronic combined systolic and diastolic CHF (congestive heart failure) (HCC) 10/20/2012  . CKD (chronic kidney disease) stage 3, GFR 30-59 ml/min (HCC) 10/20/2012  . Cardiomyopathy, dilated, nonischemic 10/14/2011  . CRT - pacemaker Medtronic 10/14/2011  . Hypokalemia 10/12/2011  . Acute renal insufficiency, Scr was WNL in April 2013 10/11/2011  . Anemia 10/11/2011  . Unstable angina (HCC) 10/11/2011  . Gait abnormality 05/13/2011  . Syncope 03/25/2011  . COPD on CXR 03/25/2011  . Coronary atherosclerosis, no significant obstructive lesions 03/25/2011  . Symptomatic advanced heart block, 2:1 AV block, RT BBB, Lt post. fas. block.(March 2013) 03/23/2011  . Paroxysmal atrial fibrillation (HCC) 03/23/2011  . Lung nodule 12/13/2010  . DM type 2 (diabetes mellitus, type 2) (HCC) 12/13/2010  . Hyperlipidemia 12/13/2010  . TOTAL KNEE FOLLOW-UP 06/20/2009  . MONONEURITIS, LEG 02/19/2009  . KNEE, ARTHRITIS, DEGEN./OSTEO 11/30/2008  . SPINAL STENOSIS 07/04/2008  . HEMARTHROSIS, LOWER LEG 05/10/2008  . Effusion of lower leg joint 05/03/2008  . KNEE PAIN 05/03/2008  . Hypertension 05/03/2008  Past Surgical History:  Procedure Laterality Date  . Bladder tack  1970's  . CARDIAC CATHETERIZATION  10/15/2011   Normal coronaries  . CARDIOVERSION N/A 10/28/2013   Procedure: CARDIOVERSION;  Surgeon: Thurmon Fair, MD;  Location: [facility] ENDOSCOPY;  Service: Cardiovascular;  Laterality: N/A;  . CARDIOVERSION N/A 08/20/2016   Procedure: CARDIOVERSION;  Surgeon: Thurmon Fair, MD;  Location: [facility] ENDOSCOPY;  Service: Cardiovascular;  Laterality: N/A;  . CHOLECYSTECTOMY  1999  . INTRAMEDULLARY (IM) NAIL INTERTROCHANTERIC Right 11/03/2015   Procedure: INTRAMEDULLARY (IM) NAIL RIGHT INTERTROCHANTERIC HIP FRACTURE;  Surgeon: Tarry Kos, MD;  Location: [facility] OR;  Service: Orthopedics;  Laterality: Right;  . LEFT AND RIGHT HEART CATHETERIZATION WITH CORONARY ANGIOGRAM N/A 10/15/2011   Procedure: LEFT AND RIGHT HEART CATHETERIZATION WITH CORONARY ANGIOGRAM;  Surgeon: Chrystie Nose, MD;  Location: [facility] CATH LAB;  Service: Cardiovascular;  Laterality: N/A;  . NM MYOCAR PERF WALL MOTION  03/15/2009   Normal  . PACEMAKER INSERTION  10/16/2011   Medtronic  . PERMANENT PACEMAKER INSERTION N/A 03/24/2011   Procedure: PERMANENT PACEMAKER INSERTION;  Surgeon: Thurmon Fair, MD;  Location: [facility] CATH LAB;  Service: Cardiovascular;  Laterality: N/A;  . REPLACEMENT TOTAL KNEE       OB History   None      Home Medications    Prior to Admission medications   Medication Sig Start Date End Date Taking? Authorizing Provider  acetaminophen (TYLENOL) 325 MG tablet Take 650 mg by mouth every 4 (four) hours as needed.    [provider]  amiodarone (PACERONE) 200 MG tablet Take 1 tablet (200 mg total) by mouth daily. 07/18/16   Croitoru, Mihai, MD  AMITIZA 24 MCG capsule TAKE (1) CAPSULE BY MOUTH TWICE DAILY. 02/25/17   Setzer, Brand Males, NP  Coenzyme Q10 (CO Q 10 PO) Take 1 tablet by mouth daily.    [provider]  DM-Doxylamine-Acetaminophen (NYQUIL COLD & FLU PO) Take 1 capsule by mouth at bedtime as needed (sleep).    [provider]  ELIQUIS 2.5 MG TABS tablet TAKE ONE TABLET BY MOUTH TWICE DAILY. 02/16/17   Croitoru, Mihai, MD  ferrous sulfate 325 (65 FE) MG tablet Take 325 mg by mouth daily with breakfast.     [provider]  furosemide (LASIX) 40 MG tablet Take 40 mg (1 tablet) daily. If weight increases by 3 lbs in 1 day or 5 lbs in 1 week, take a  second 40 mg (1 tablet). 08/22/16   Duke, Roe Rutherford, PA  glimepiride (AMARYL) 1 MG tablet Take 0.5 tablet (0.5 mg total) by mouth daily if CBG is >120.    [provider]  losartan (COZAAR) 25 MG tablet TAKE 1 TABLET BY MOUTH ONCE DAILY. 03/19/17   Croitoru, Mihai, MD  metoprolol succinate (TOPROL-XL) 25 MG 24 hr tablet Take 3 tablets (75 mg total) by mouth daily. 09/09/16   Croitoru, Mihai, MD  Multiple Vitamins-Minerals (CENTRUM SILVER 50+WOMEN PO) Take 1 tablet by mouth daily.    [provider]  Multiple Vitamins-Minerals (ICAPS AREDS 2) CAPS Take 1 capsule by mouth twice daily    [provider]  nitroGLYCERIN (NITROSTAT) 0.4 MG SL tablet DISSOLVE 1 TABLET UNDER TONGUE EVERY 5 MINUTES UP TO 15 MIN FOR CHESTPAIN. IF NO RELIEF CALL 911. 12/25/14   Croitoru, Mihai, MD  ondansetron (ZOFRAN) 4 MG tablet Take 4 mg by mouth every 4 (four) hours as needed for nausea or vomiting.    [provider]  potassium chloride  SA (K-DUR,KLOR-CON) 20 MEQ tablet Take 1 capsule (20 mEq) daily. Take an extra 1 capsule (20 mEq) on days you take an extra lasix. 08/22/16   Duke, Roe Rutherford, PA  pravastatin (PRAVACHOL) 40 MG tablet TAKE 1 TABLET BY MOUTH EACH EVENING. 02/16/17   Croitoru, Mihai, MD  traMADol (ULTRAM) 50 MG tablet Take 1 tablet (50 mg total) by mouth every 8 (eight) hours as needed. 12/15/16   Croitoru, Mihai, MD  benazepril (LOTENSIN) 40 MG tablet Take 40 mg by mouth 2 (two) times daily.  03/23/11  [provider]    Family History Family History  Problem Relation Age of Onset  . Suicidality Mother   . Heart murmur Daughter   . Heart attack Brother     Social History Social History   Tobacco Use  . Smoking status: Never Smoker  . Smokeless tobacco: Never Used  Substance Use Topics  . Alcohol use: No  . Drug use: No     Allergies   Coumadin [warfarin sodium]; Meloxicam; Metformin and related; Morphine; Nabumetone; Oxycodone; and Sulfonamide  derivatives   Review of Systems Review of Systems   Physical Exam Updated Vital Signs BP (!) 144/86 (BP Location: Right Arm)   Pulse 75   Temp 97.6 F (36.4 C) (Oral)   Resp (!) 23   Ht 1.702 m (5\' 7" )   Wt 53.5 kg (118 lb)   SpO2 93%   BMI 18.48 kg/m   Physical Exam  Constitutional: She appears well-developed and well-nourished. She does not appear ill. No distress.  HENT:  Head: Normocephalic and atraumatic.  Mouth/Throat: Oropharynx is clear and moist.  Neck: Normal range of motion.  Cardiovascular: Normal rate.  Pulmonary/Chest: Effort normal. She has rhonchi in the right middle field, the right lower field, the left middle field and the left lower field. She exhibits mass. She exhibits no tenderness.  Abdominal: Soft. Bowel sounds are normal.  Musculoskeletal:       Right lower leg: She exhibits edema. She exhibits no tenderness.       Left lower leg: She exhibits edema. She exhibits no tenderness.  Chest wall with pacemaker left  chest  Neurological: She is alert.  Skin: Skin is warm and dry. Capillary refill takes less than 2 seconds.  Psychiatric: She has a normal mood and affect.  Nursing note and vitals reviewed.    ED Treatments / Results  Labs (all labs ordered are listed, but only abnormal results are displayed) Labs Reviewed  CBC WITH DIFFERENTIAL/PLATELET - Abnormal; Notable for the following components:      Result Value   RBC 3.83 (*)    MCV 103.7 (*)    MCH 34.2 (*)    RDW 17.8 (*)    All other components within normal limits  COMPREHENSIVE METABOLIC PANEL - Abnormal; Notable for the following components:   Sodium 125 (*)    Chloride 86 (*)    Glucose, Bld 211 (*)    BUN 40 (*)    Creatinine, Ser 1.42 (*)    Total Bilirubin 1.4 (*)    GFR calc non Af Amer 31 (*)    GFR calc Af Amer 36 (*)    All other components within normal limits  BRAIN NATRIURETIC PEPTIDE - Abnormal; Notable for the following components:   B Natriuretic Peptide  2,655.0 (*)    All other components within normal limits  TROPONIN I - Abnormal; Notable for the following components:   Troponin I 0.03 (*)  All other components within normal limits    EKG EKG Interpretation  Date/Time:  Saturday April 11 2017 12:08:26 EDT Ventricular Rate:  76 PR Interval:    QRS Duration: 155 QT Interval:  477 QTC Calculation: 537 R Axis:   -87 Text Interpretation:  Atrial-ventricular dual-paced rhythm No further analysis attempted due to paced rhythm Confirmed by Margarita Grizzle [phone]) on 04/11/2017 12:35:20 PM   Radiology Dg Chest 2 View  Result Date: 04/11/2017 CLINICAL DATA:  Dyspnea. EXAM: CHEST - 2 VIEW COMPARISON:  11/08/2016. FINDINGS: Left chest wall pacer device is noted with leads in the right atrial appendage and right ventricle. Cardiac enlargement noted. Aortic atherosclerosis noted. New moderate left pleural effusion and small right pleural effusion. Pulmonary vascular congestion is identified. Chronic opacities within the right upper lung zone appears similar to previous exam. IMPRESSION: New bilateral pleural effusions, left greater in right, and pulmonary vascular congestion compatible with CHF. Chronic lung disease. Electronically Signed   By: Signa Kell M.D.   On: 04/11/2017 13:27    Procedures Procedures (including critical care time)  Medications Ordered in ED Medications - No data to display   Initial Impression / Assessment and Plan / ED Course  I have reviewed the triage vital signs and the nursing notes.  Pertinent labs & imaging results that were available during my care of the patient were reviewed by me and considered in my medical decision making (see chart for details).    This is a 84 year old female with known history of nonischemic cardiomyopathy presents today with increased swelling and dyspnea, with new oxygen requirement, workup here significant for increased markings and pleural effusions as well as increased BNP,  both consistent with congestive heart failure.  She is receiving IV Lasix here.  She has a elevated troponin at 0.03 which has been normal in the past.  Blood sugar is stable at 211.  Renal function appears stable from first prior of 312 at 1.42.  Sodium has decreased from 133-125.  Plan admission for ongoing diuresis until improvement in respiratory status. With Dr. Roda Shutters and she will see for admission Final Clinical Impressions(s) / ED Diagnoses   Final diagnoses:  Acute on chronic congestive heart failure, unspecified heart failure type [facility])    ED Discharge Orders    None       Margarita Grizzle, MD 04/11/17 1453

## 2017-04-11 NOTE — ED Notes (Addendum)
Date and time results received: 04/11/17 1332   Test: Troponin  Critical Value: 0.03   Name of Provider Notified: Dr. Rosalia Hammers  Orders Received? Or Actions Taken?: No new orders obtained

## 2017-04-12 ENCOUNTER — Observation Stay (HOSPITAL_COMMUNITY): Payer: PPO

## 2017-04-12 DIAGNOSIS — I441 Atrioventricular block, second degree: Secondary | ICD-10-CM | POA: Diagnosis present

## 2017-04-12 DIAGNOSIS — J9601 Acute respiratory failure with hypoxia: Secondary | ICD-10-CM | POA: Diagnosis present

## 2017-04-12 DIAGNOSIS — I428 Other cardiomyopathies: Secondary | ICD-10-CM | POA: Diagnosis present

## 2017-04-12 DIAGNOSIS — Z681 Body mass index (BMI) 19 or less, adult: Secondary | ICD-10-CM | POA: Diagnosis not present

## 2017-04-12 DIAGNOSIS — I130 Hypertensive heart and chronic kidney disease with heart failure and stage 1 through stage 4 chronic kidney disease, or unspecified chronic kidney disease: Secondary | ICD-10-CM | POA: Diagnosis present

## 2017-04-12 DIAGNOSIS — Z886 Allergy status to analgesic agent status: Secondary | ICD-10-CM | POA: Diagnosis not present

## 2017-04-12 DIAGNOSIS — I272 Pulmonary hypertension, unspecified: Secondary | ICD-10-CM | POA: Diagnosis present

## 2017-04-12 DIAGNOSIS — E1122 Type 2 diabetes mellitus with diabetic chronic kidney disease: Secondary | ICD-10-CM | POA: Diagnosis present

## 2017-04-12 DIAGNOSIS — Z66 Do not resuscitate: Secondary | ICD-10-CM | POA: Diagnosis present

## 2017-04-12 DIAGNOSIS — Z96659 Presence of unspecified artificial knee joint: Secondary | ICD-10-CM | POA: Diagnosis present

## 2017-04-12 DIAGNOSIS — E440 Moderate protein-calorie malnutrition: Secondary | ICD-10-CM | POA: Diagnosis present

## 2017-04-12 DIAGNOSIS — Z882 Allergy status to sulfonamides status: Secondary | ICD-10-CM | POA: Diagnosis not present

## 2017-04-12 DIAGNOSIS — K219 Gastro-esophageal reflux disease without esophagitis: Secondary | ICD-10-CM | POA: Diagnosis present

## 2017-04-12 DIAGNOSIS — I5043 Acute on chronic combined systolic (congestive) and diastolic (congestive) heart failure: Secondary | ICD-10-CM | POA: Diagnosis present

## 2017-04-12 DIAGNOSIS — I509 Heart failure, unspecified: Secondary | ICD-10-CM | POA: Diagnosis present

## 2017-04-12 DIAGNOSIS — R627 Adult failure to thrive: Secondary | ICD-10-CM | POA: Diagnosis present

## 2017-04-12 DIAGNOSIS — I10 Essential (primary) hypertension: Secondary | ICD-10-CM | POA: Diagnosis not present

## 2017-04-12 DIAGNOSIS — Z950 Presence of cardiac pacemaker: Secondary | ICD-10-CM | POA: Diagnosis not present

## 2017-04-12 DIAGNOSIS — Z888 Allergy status to other drugs, medicaments and biological substances status: Secondary | ICD-10-CM | POA: Diagnosis not present

## 2017-04-12 DIAGNOSIS — Z885 Allergy status to narcotic agent status: Secondary | ICD-10-CM | POA: Diagnosis not present

## 2017-04-12 DIAGNOSIS — R64 Cachexia: Secondary | ICD-10-CM | POA: Diagnosis present

## 2017-04-12 DIAGNOSIS — E871 Hypo-osmolality and hyponatremia: Secondary | ICD-10-CM | POA: Diagnosis present

## 2017-04-12 DIAGNOSIS — I482 Chronic atrial fibrillation: Secondary | ICD-10-CM | POA: Diagnosis present

## 2017-04-12 DIAGNOSIS — N183 Chronic kidney disease, stage 3 (moderate): Secondary | ICD-10-CM | POA: Diagnosis present

## 2017-04-12 DIAGNOSIS — I442 Atrioventricular block, complete: Secondary | ICD-10-CM | POA: Diagnosis present

## 2017-04-12 DIAGNOSIS — I480 Paroxysmal atrial fibrillation: Secondary | ICD-10-CM | POA: Diagnosis present

## 2017-04-12 LAB — CBC
HEMATOCRIT: 38.8 % (ref 36.0–46.0)
HEMOGLOBIN: 12.7 g/dL (ref 12.0–15.0)
MCH: 34 pg (ref 26.0–34.0)
MCHC: 32.7 g/dL (ref 30.0–36.0)
MCV: 104 fL — ABNORMAL HIGH (ref 78.0–100.0)
Platelets: 191 10*3/uL (ref 150–400)
RBC: 3.73 MIL/uL — ABNORMAL LOW (ref 3.87–5.11)
RDW: 17.7 % — ABNORMAL HIGH (ref 11.5–15.5)
WBC: 7.5 10*3/uL (ref 4.0–10.5)

## 2017-04-12 LAB — COMPREHENSIVE METABOLIC PANEL
ALT: 22 U/L (ref 14–54)
ANION GAP: 11 (ref 5–15)
AST: 25 U/L (ref 15–41)
Albumin: 3.3 g/dL — ABNORMAL LOW (ref 3.5–5.0)
Alkaline Phosphatase: 105 U/L (ref 38–126)
BUN: 35 mg/dL — ABNORMAL HIGH (ref 6–20)
CHLORIDE: 89 mmol/L — AB (ref 101–111)
CO2: 29 mmol/L (ref 22–32)
Calcium: 8.8 mg/dL — ABNORMAL LOW (ref 8.9–10.3)
Creatinine, Ser: 1.32 mg/dL — ABNORMAL HIGH (ref 0.44–1.00)
GFR calc non Af Amer: 34 mL/min — ABNORMAL LOW (ref >60)
GFR, EST AFRICAN AMERICAN: 40 mL/min — AB (ref >60)
Glucose, Bld: 97 mg/dL (ref 65–99)
Potassium: 4.2 mmol/L (ref 3.5–5.1)
SODIUM: 129 mmol/L — AB (ref 135–145)
Total Bilirubin: 1.4 mg/dL — ABNORMAL HIGH (ref 0.3–1.2)
Total Protein: 6 g/dL — ABNORMAL LOW (ref 6.5–8.1)

## 2017-04-12 LAB — GLUCOSE, CAPILLARY: GLUCOSE-CAPILLARY: 87 mg/dL (ref 65–99)

## 2017-04-12 LAB — ECHOCARDIOGRAM COMPLETE
HEIGHTINCHES: 67 in
WEIGHTICAEL: 2137.58 [oz_av]

## 2017-04-12 MED ORDER — AMIODARONE HCL 100 MG PO TABS
50.0000 mg | ORAL_TABLET | Freq: Every day | ORAL | Status: DC
  Administered 2017-04-13 – 2017-04-15 (×3): 50 mg via ORAL
  Filled 2017-04-12 (×5): qty 1

## 2017-04-12 NOTE — Progress Notes (Signed)
*  PRELIMINARY RESULTS* Echocardiogram 2D Echocardiogram has been performed.  Kathleen [name] 04/12/2017, 9:16 AM

## 2017-04-12 NOTE — Progress Notes (Signed)
PROGRESS NOTE  NATIA FAHMY ZOX:[id number] DOB: 18-Jul-1924 DOA: 04/11/2017 PCP: Elfredia Nevins, MD  HPI/Recap of past 24 hours:  Feeling better,denies chest pain, no cough,  no fever, less edema Family at bedside  Assessment/Plan: Active Problems:   CHF (congestive heart failure) (HCC)   Acute on chronic Combined chf exacerbation/acute hypoxic respiratory failure -IV Lasix 40 twice a day,  -continue toprol xl daily (she reports she takes torpol xl  twice a day, I have reviewed outpatient record , Per cardiology notes she is  prescribed toprolxl once a day)  - decrease dose of losartan for now to leave room for diuresis -Heart failure order set utilized, daily weight, strict intake and output.  Fluid restriction.  Repeat echocardiogram pending, ordered cardiology consult  Hyponatremia No confusion Likely from volume overload,  Improving, continue Lasix  Chronic A. Fib -Currently paced rhythm, continue Lopressor and amiodarone Continue apixaban  History of high-grade AV block, post dual-chamber biventricular pacemaker Cardiology consulted  CKD 3 Creatinine seems test to progressed in the last year Creatinine 1.4 now may be new  baseline now Renal dosing meds   FTT, start  physical therapy, will need ambulate on room air, may need to discharge on home oxygen  DVT prophylaxis: Apixaban  Consultants: Cardiology  Code Status: DNR, confirmed with daughter   Family Communication:  Patient , daughter at bedside  Disposition Plan: home , wean oxygen, may need home o2, likely will need home health   Consultants:  cardiology  Procedures:  none  Antibiotics:  none   Objective: BP 117/62 (BP Location: Left Arm)   Pulse 72   Temp 97.7 F (36.5 C) (Oral)   Resp 20   Ht 5\' 7"  (1.702 m)   Wt 60.6 kg (133 lb 9.6 oz)   SpO2 98%   BMI 20.92 kg/m   Intake/Output Summary (Last 24 hours) at 04/12/2017 1336 Last data filed at 04/12/2017  1200 Gross per 24 hour  Intake 720 ml  Output 600 ml  Net 120 ml   Filed Weights   04/11/17 1630 04/12/17 0500 04/12/17 0548  Weight: 60 kg (132 lb 4.4 oz) 60 kg (132 lb 4.4 oz) 60.6 kg (133 lb 9.6 oz)    Exam: Patient is examined daily including today on 04/12/2017, exams remain the same as of yesterday except that has changed    General:  NAD  Cardiovascular: paced rhythm   Respiratory: bibasilar crackles is improving, no wheezing, no rhonchi  Abdomen: Soft/ND/NT, positive BS  Musculoskeletal: trace ankle Edema has improved  Neuro: alert, oriented   Data Reviewed: Basic Metabolic Panel: Recent Labs  Lab 04/11/17 1221 04/12/17 0645  NA 125* 129*  K 4.5 4.2  CL 86* 89*  CO2 26 29  GLUCOSE 211* 97  BUN 40* 35*  CREATININE 1.42* 1.32*  CALCIUM 9.0 8.8*   Liver Function Tests: Recent Labs  Lab 04/11/17 1221 04/12/17 0645  AST 33 25  ALT 24 22  ALKPHOS 118 105  BILITOT 1.4* 1.4*  PROT 6.6 6.0*  ALBUMIN 3.5 3.3*   No results for input(s): LIPASE, AMYLASE in the last 168 hours. No results for input(s): AMMONIA in the last 168 hours. CBC: Recent Labs  Lab 04/11/17 1221 04/12/17 0645  WBC 8.0 7.5  NEUTROABS 6.9  --   HGB 13.1 12.7  HCT 39.7 38.8  MCV 103.7* 104.0*  PLT 244 191   Cardiac Enzymes:   Recent Labs  Lab 04/11/17 1221  TROPONINI 0.03*   BNP (  last 3 results) Recent Labs    08/21/16 1459 04/11/17 1221  BNP 2,323.4* 2,655.0*    ProBNP (last 3 results) No results for input(s): PROBNP in the last 8760 hours.  CBG: Recent Labs  Lab 04/11/17 1645 04/12/17 0721  GLUCAP 144* 87    No results found for this or any previous visit (from the past 240 hour(s)).   Studies: No results found.  Scheduled Meds: . [START ON 04/13/2017] amiodarone  50 mg Oral Daily  . apixaban  2.5 mg Oral BID  . feeding supplement (ENSURE ENLIVE)  237 mL Oral BID BM  . feeding supplement (GLUCERNA SHAKE)  237 mL Oral TID BM  . ferrous sulfate  325 mg  Oral Q breakfast  . furosemide  40 mg Intravenous BID  . losartan  12.5 mg Oral Daily  . lubiprostone  24 mcg Oral BID WC  . metoprolol succinate  75 mg Oral Daily  . potassium chloride SA  20 mEq Oral Daily  . pravastatin  40 mg Oral q1800    Continuous Infusions:   Time spent: I have personally reviewed and interpreted on  04/12/2017 daily labs, tele strips, imagings as discussed above under date review session and assessment and plans.  I reviewed all nursing notes,  vitals, pertinent old records  I have discussed plan of care as described above with RN , patient and family on 04/12/2017   Albertine Grates MD, PhD  [facility] Pager [phone]. If 7PM-7AM, please contact night-coverage at [email or web address], password [facility] 04/12/2017, 1:36 PM  LOS: 0 days

## 2017-04-12 NOTE — Progress Notes (Signed)
SATURATION QUALIFICATIONS: (This note is used to comply with regulatory documentation for home oxygen)  Patient Saturations on Room Air at Rest = 97%  Patient Saturations on Room Air while Ambulating = 88%  Patient Saturations on 2 Liters of oxygen while Ambulating = 95%  Please briefly explain why patient needs home oxygen: Pt very weak. Was winded getting to the door.

## 2017-04-13 DIAGNOSIS — R627 Adult failure to thrive: Secondary | ICD-10-CM

## 2017-04-13 DIAGNOSIS — I5043 Acute on chronic combined systolic (congestive) and diastolic (congestive) heart failure: Secondary | ICD-10-CM

## 2017-04-13 DIAGNOSIS — I482 Chronic atrial fibrillation, unspecified: Secondary | ICD-10-CM

## 2017-04-13 LAB — COMPREHENSIVE METABOLIC PANEL
ALK PHOS: 105 U/L (ref 38–126)
ALT: 22 U/L (ref 14–54)
ANION GAP: 11 (ref 5–15)
AST: 23 U/L (ref 15–41)
Albumin: 3.1 g/dL — ABNORMAL LOW (ref 3.5–5.0)
BILIRUBIN TOTAL: 1.1 mg/dL (ref 0.3–1.2)
BUN: 35 mg/dL — ABNORMAL HIGH (ref 6–20)
CALCIUM: 8.8 mg/dL — AB (ref 8.9–10.3)
CO2: 29 mmol/L (ref 22–32)
CREATININE: 1.24 mg/dL — AB (ref 0.44–1.00)
Chloride: 90 mmol/L — ABNORMAL LOW (ref 101–111)
GFR, EST AFRICAN AMERICAN: 43 mL/min — AB (ref >60)
GFR, EST NON AFRICAN AMERICAN: 37 mL/min — AB (ref >60)
Glucose, Bld: 102 mg/dL — ABNORMAL HIGH (ref 65–99)
Potassium: 4.9 mmol/L (ref 3.5–5.1)
Sodium: 130 mmol/L — ABNORMAL LOW (ref 135–145)
TOTAL PROTEIN: 6 g/dL — AB (ref 6.5–8.1)

## 2017-04-13 MED ORDER — POLYETHYLENE GLYCOL 3350 17 G PO PACK: 17.0000 g | PACK | Freq: Every day | ORAL | Status: DC | PRN

## 2017-04-13 NOTE — Care Management Note (Signed)
Case Management Note  Patient Details  Name: Kathleen [name] MRN: [id number] Date of Birth: [birth year]  Subjective/Objective:   Admitted with CHF. Pt is from home, lives with her daughter, who is at the bedside. Pt hs independent pta. She has walker and WC to use if needed. She was supposed to get home oxygen from MD office but it was never set up despite sating 87% ambulating in office. Daughter reports pt is non-compliant with diet because she can barely get the patient to eat. Says she has 2-3 ensures a day and sometimes eats a soup a lunch. Daughter says the patient usually starts eating and then stops because she gets nauseas, says they don't know why, it has never been investigated.                 Action/Plan: DC home with HH when ready. CM has referred dietician and told MD about nausea. Pt has used [facility] in the past and would like to use them again. Kathleen [name], [facility] rep, aware of referral and will pull pt info from chart. Will need home O2 assessment prior to DC.   Expected Discharge Date:       04/15/17           Expected Discharge Plan:  Home w Home Health Services  In-House Referral:  Nutrition  Discharge planning Services  CM Consult  Post Acute Care Choice:  Home Health Choice offered to:  Patient  HH Arranged:  RN, PT [facility] Agency:  [facility]  Status of Service:  In process, will continue to follow  Malcolm Metro, RN 04/13/2017, 1:24 PM

## 2017-04-13 NOTE — Evaluation (Signed)
Physical Therapy Evaluation Patient Details Name: Kathleen [name] MRN: [id number] DOB: Dec 27, 1924 Today's Date: 04/13/2017   History of Present Illness  Kathleen [name] is a 84yo white female who comes to [facility] on 3/30 c dry cough, SOB, and ankle swelling, noted to have CHF. PMH: combined CHF, AF on eliquis, amiodarone, metoprolol, DM, HTN, s/p PPM, Rigth hip ORIF 2018, TKA >4YA. At baseline pt lives with daughter, perfoms mostly household distance aMB with RW to [phone]. No recent falls in 3 months.   Clinical Impression  Pt admitted with above diagnosis. Pt currently with functional limitations due to the deficits listed below (see "PT Problem List"). Upon entry, the patient is received semirecumbent in bed, daughter present. Pt received on 2.5L/min, maintained with all activity, desat to 92% with walk to the door and back x2. All functional mobility performed minGuard assist, Transfers requiring maximal effort and heavy BUE support, and AMB empirically indicative of elevated falls risk both in distance tolerated as well as gait speed. Pt has strong social support at home, good safety awareness, and adequate DME. Pt would be a good candidate for HHPT, with eventual transition to OPPT. Pt will benefit from skilled PT intervention to increase independence and safety with basic mobility in preparation for discharge to the venue listed below.       Follow Up Recommendations Home health PT;Supervision for mobility/OOB    Equipment Recommendations  None recommended by PT    Recommendations for Other Services       Precautions / Restrictions Precautions Precautions: Fall Restrictions Weight Bearing Restrictions: No      Mobility  Bed Mobility Overal bed mobility: Needs Assistance Bed Mobility: Supine to Sit     Supine to sit: Supervision;HOB elevated        Transfers Overall transfer level: Needs assistance Equipment used: Standard walker Transfers: Sit to/from Stand Sit to Stand:  Min guard         General transfer comment: maxmial effort required   Ambulation/Gait Ambulation/Gait assistance: Min guard Ambulation Distance (Feet): 24 Feet Assistive device: Standard walker Gait Pattern/deviations: WFL(Within Functional Limits)     General Gait Details: attempted 1 hand held assist with significant gait instability, moved to SW with neara supervision performance, no LOB; no LOB with slow safe turning with SW   Stairs            Wheelchair Mobility    Modified Rankin (Stroke Patients Only)       Balance Overall balance assessment: History of Falls;Needs assistance Sitting-balance support: No upper extremity supported;Feet supported Sitting balance-Leahy Scale: Good     Standing balance support: During functional activity;Bilateral upper extremity supported Standing balance-Leahy Scale: Fair                               Pertinent Vitals/Pain Pain Assessment: (intermittent residual Left rib pain from recent shingles. )    Home Living Family/patient expects to be discharged to:: Private residence Living Arrangements: Children Available Help at Discharge: Family Type of Home: House Home Access: Ramped entrance     Home Layout: One level Home Equipment: Shower seat - built in;Walker - 4 wheels;Walker - 2 wheels;Wheelchair - manual      Prior Function Level of Independence: Independent with assistive device(s)         Comments: indep with bathing, dressing, toiletting, feeding      Hand Dominance        Extremity/Trunk Assessment  Cervical / Trunk Assessment Cervical / Trunk Assessment: Kyphotic  Communication   Communication: No difficulties  Cognition Arousal/Alertness: Awake/alert Behavior During Therapy: WFL for tasks assessed/performed Overall Cognitive Status: Within Functional Limits for tasks assessed                                        General Comments       Exercises Other Exercises Other Exercises: 5x STS with chair arms to SW, upright stance. Requires 60-90sec.    Assessment/Plan    PT Assessment Patient needs continued PT services  PT Problem List Decreased strength;Decreased activity tolerance;Decreased balance;Decreased mobility       PT Treatment Interventions DME instruction;Balance training;Gait training;Functional mobility training;Therapeutic activities;Therapeutic exercise;Patient/family education    PT Goals (Current goals can be found in the Care Plan section)  Acute Rehab PT Goals Patient Stated Goal: regain strength and ease in AMB  PT Goal Formulation: With patient Time For Goal Achievement: 04/27/17 Potential to Achieve Goals: Good    Frequency Min 3X/week   Barriers to discharge        Co-evaluation               AM-PAC PT "6 Clicks" Daily Activity  Outcome Measure Difficulty turning over in bed (including adjusting bedclothes, sheets and blankets)?: A Lot Difficulty moving from lying on back to sitting on the side of the bed? : A Lot Difficulty sitting down on and standing up from a chair with arms (e.g., wheelchair, bedside commode, etc,.)?: A Lot Help needed moving to and from a bed to chair (including a wheelchair)?: A Lot Help needed walking in hospital room?: A Lot Help needed climbing 3-5 steps with a railing? : Total 6 Click Score: 11    End of Session Equipment Utilized During Treatment: Gait belt Activity Tolerance: Patient tolerated treatment well;Patient limited by fatigue Patient left: in chair Nurse Communication: Mobility status PT Visit Diagnosis: Unsteadiness on feet (R26.81);Difficulty in walking, not elsewhere classified (R26.2)    Time: 8295-6213 PT Time Calculation (min) (ACUTE ONLY): 34 min   Charges:   PT Evaluation $PT Eval Moderate Complexity: 1 Mod PT Treatments $Therapeutic Activity: 8-22 mins   PT G Codes:        10:52 AM, 2017-04-17 Rosamaria Lints, PT,  DPT Physical Therapist - [place] [phone] (Office)    Deshara Rossi C April 17, 2017, 10:49 AM

## 2017-04-13 NOTE — Progress Notes (Addendum)
PROGRESS NOTE  Kathleen [name]:[id number] DOB: 05/08/1924 DOA: 04/11/2017 PCP: Elfredia Nevins, MD  Brief History18  84 year old female with a history of systolic CHF with EF 40-45%, complete heart block status post permanent pacemaker, atrial fibrillation on apixaban, and CKD stage III presenting with 1 week history of shortness of breath, dry cough, and increasing ankle edema.  Chest x-ray showed new bilateral pleural effusion, left greater than right with pulmonary vascular congestion.  The patient has been compliant with her diet, fluid restriction, and furosemide.  The patient was noted to have oxygen saturation of 88% on room air in the emergency department.  The patient was started on intravenous furosemide after admission with improvement clinically.  Echocardiogram on 04/12/2017 showed EF 25-30% with akinesis of the entire inferior myocardium.  Cardiology was consulted to assist with management.  Assessment/Plan: Acute respiratory failure with hypoxia -Secondary to CHF -Presently stable on 2 L -will need ambulatory pulse ox prior to d/c -personally reviewed CXR--bilateral pleural effusion  Acute on chronic systolic and diastolic CHF -04/12/2017 echo EF 25-30%, HK of the entire inferior myocardium, PAS P 43. -EF has gone down since 05/01/2015 -Cardiology consult -Continue furosemide IV -Continue metoprolol succinate -Continue losartan -wt stable since admission -NEG 800 cc -personally reviewed EKG --paced  Chronic atrial fibrillation -Rate controlled -Continue amiodarone -Continue apixaban -Continue metoprolol succinate  CKD stage III -Baseline creatinine 1.1-1.4 -Monitor with diuresis  Complete Heart Block -s/p PPM  Hyponatremia -Secondary to CHF -Improving with diuresis  Failure to thrive/poor po intake -in part due to congestive gastropathy from low flow state -improving with diuresis  Hyperlipidemia -continue statin    Disposition Plan:    Home in 1-2 days  Family Communication:   Daughter updated at bedside 04/13/17--Total time spent 35 minutes.  Greater than 50% spent face to face counseling and coordinating care.   Consultants:  cardiology  Code Status:  FULL / DNR  DVT Prophylaxis:  [place] Heparin / [place] Lovenox   Procedures: As Listed in Progress Note Above  Antibiotics: None    Subjective: Patient denies fevers, chills, headache, chest pain, dyspnea, nausea, vomiting, diarrhea, abdominal pain, dysuria, hematuria, hematochezia, and melena.   Objective: Vitals:   04/12/17 2116 04/13/17 0620 04/13/17 1020 04/13/17 1230  BP: 117/86 118/87    Pulse: 70 70 76   Resp: 20 20    Temp: 98 F (36.7 C) 98 F (36.7 C)    TempSrc: Oral Oral    SpO2: 98% 97% 92% 96%  Weight:  60.3 kg (133 lb)    Height:        Intake/Output Summary (Last 24 hours) at 04/13/2017 1402 Last data filed at 04/13/2017 0900 Gross per 24 hour  Intake 480 ml  Output 1150 ml  Net -670 ml   Weight change: 6.804 kg (15 lb) Exam:   General:  Pt is alert, follows commands appropriately, not in acute distress  HEENT: No icterus, No thrush, No neck mass, [place]/AT  Cardiovascular: RRR, S1/S2, no rubs, no gallops  Respiratory: Bibasilar crackles.  No wheezing.  Good air movement.  Abdomen: Soft/+BS, non tender, non distended, no guarding  Extremities: 1 + LE edema, No lymphangitis, No petechiae, No rashes, no synovitis   Data Reviewed: I have personally reviewed following labs and imaging studies Basic Metabolic Panel: Recent Labs  Lab 04/11/17 1221 04/12/17 0645 04/13/17 0608  NA 125* 129* 130*  K 4.5 4.2 4.9  CL 86* 89* 90*  CO2 26 29 29   GLUCOSE 211* 97 102*  BUN 40* 35* 35*  CREATININE 1.42* 1.32* 1.24*  CALCIUM 9.0 8.8* 8.8*   Liver Function Tests: Recent Labs  Lab 04/11/17 1221 04/12/17 0645 04/13/17 0608  AST 33 25 23  ALT 24 22 22   ALKPHOS 118 105 105  BILITOT 1.4* 1.4* 1.1  PROT 6.6 6.0* 6.0*  ALBUMIN 3.5 3.3*  3.1*   No results for input(s): LIPASE, AMYLASE in the last 168 hours. No results for input(s): AMMONIA in the last 168 hours. Coagulation Profile: No results for input(s): INR, PROTIME in the last 168 hours. CBC: Recent Labs  Lab 04/11/17 1221 04/12/17 0645  WBC 8.0 7.5  NEUTROABS 6.9  --   HGB 13.1 12.7  HCT 39.7 38.8  MCV 103.7* 104.0*  PLT 244 191   Cardiac Enzymes: Recent Labs  Lab 04/11/17 1221  TROPONINI 0.03*   BNP: Invalid input(s): POCBNP CBG: Recent Labs  Lab 04/11/17 1645 04/12/17 0721  GLUCAP 144* 87   HbA1C: No results for input(s): HGBA1C in the last 72 hours. Urine analysis:    Component Value Date/Time   COLORURINE YELLOW 03/24/2017 1452   APPEARANCEUR CLEAR 03/24/2017 1452   LABSPEC 1.018 03/24/2017 1452   PHURINE 5.0 03/24/2017 1452   GLUCOSEU NEGATIVE 03/24/2017 1452   HGBUR NEGATIVE 03/24/2017 1452   BILIRUBINUR NEGATIVE 03/24/2017 1452   KETONESUR NEGATIVE 03/24/2017 1452   PROTEINUR 100 (A) 03/24/2017 1452   UROBILINOGEN 0.2 10/16/2013 0910   NITRITE NEGATIVE 03/24/2017 1452   LEUKOCYTESUR TRACE (A) 03/24/2017 1452   Sepsis Labs: @LABRCNTIP (procalcitonin:4,lacticidven:4) )No results found for this or any previous visit (from the past 240 hour(s)).   Scheduled Meds: . amiodarone  50 mg Oral Daily  . apixaban  2.5 mg Oral BID  . feeding supplement (ENSURE ENLIVE)  237 mL Oral BID BM  . feeding supplement (GLUCERNA SHAKE)  237 mL Oral TID BM  . ferrous sulfate  325 mg Oral Q breakfast  . furosemide  40 mg Intravenous BID  . losartan  12.5 mg Oral Daily  . lubiprostone  24 mcg Oral BID WC  . metoprolol succinate  75 mg Oral Daily  . pravastatin  40 mg Oral q1800   Continuous Infusions:  Procedures/Studies: Dg Chest 2 View  Result Date: 04/11/2017 CLINICAL DATA:  Dyspnea. EXAM: CHEST - 2 VIEW COMPARISON:  11/08/2016. FINDINGS: Left chest wall pacer device is noted with leads in the right atrial appendage and right ventricle.  Cardiac enlargement noted. Aortic atherosclerosis noted. New moderate left pleural effusion and small right pleural effusion. Pulmonary vascular congestion is identified. Chronic opacities within the right upper lung zone appears similar to previous exam. IMPRESSION: New bilateral pleural effusions, left greater in right, and pulmonary vascular congestion compatible with CHF. Chronic lung disease. Electronically Signed   By: Signa Kell M.D.   On: 04/11/2017 13:27    Catarina Hartshorn, DO  [facility] Pager [phone]  If 7PM-7AM, please contact night-coverage [email or web address] Password [email or web address] 04/13/2017, 2:02 PM   LOS: 1 day

## 2017-04-14 DIAGNOSIS — I10 Essential (primary) hypertension: Secondary | ICD-10-CM

## 2017-04-14 DIAGNOSIS — I480 Paroxysmal atrial fibrillation: Secondary | ICD-10-CM

## 2017-04-14 DIAGNOSIS — I442 Atrioventricular block, complete: Secondary | ICD-10-CM

## 2017-04-14 LAB — BASIC METABOLIC PANEL
ANION GAP: 10 (ref 5–15)
BUN: 40 mg/dL — ABNORMAL HIGH (ref 6–20)
CHLORIDE: 88 mmol/L — AB (ref 101–111)
CO2: 30 mmol/L (ref 22–32)
Calcium: 8.7 mg/dL — ABNORMAL LOW (ref 8.9–10.3)
Creatinine, Ser: 1.28 mg/dL — ABNORMAL HIGH (ref 0.44–1.00)
GFR calc Af Amer: 41 mL/min — ABNORMAL LOW (ref >60)
GFR, EST NON AFRICAN AMERICAN: 36 mL/min — AB (ref >60)
Glucose, Bld: 139 mg/dL — ABNORMAL HIGH (ref 65–99)
POTASSIUM: 4.9 mmol/L (ref 3.5–5.1)
SODIUM: 128 mmol/L — AB (ref 135–145)

## 2017-04-14 MED ORDER — ENSURE ENLIVE PO LIQD
237.0000 mL | Freq: Three times a day (TID) | ORAL | Status: DC
  Administered 2017-04-15 – 2017-04-17 (×6): 237 mL via ORAL

## 2017-04-14 NOTE — Progress Notes (Signed)
Physical Therapy Treatment Patient Details Name: Kathleen [name] MRN: [id number] DOB: 23-Apr-1924 Today's Date: 04/14/2017    History of Present Illness Kathleen [name] is a 84yo white female who comes to [facility] on 3/30 c dry cough, SOB, and ankle swelling, noted to have CHF. PMH: combined CHF, AF on eliquis, amiodarone, metoprolol, DM, HTN, s/p PPM, Rigth hip ORIF 2018, TKA >4YA. At baseline pt lives with daughter, perfoms mostly household distance aMB with RW to [phone]. No recent falls in 3 months.     PT Comments    Pt was seen for evaluation of current mobility and strength, but has continued to use supplemental O2.  Her O2 sats were stable with O2 via nasal cannula but did not assess with out O2.  May use the next session to assess this as she was not on O2 at home.  Follow Up Recommendations  Home health PT;Supervision for mobility/OOB     Equipment Recommendations  None recommended by PT    Recommendations for Other Services       Precautions / Restrictions Precautions Precautions: Fall Restrictions Weight Bearing Restrictions: No    Mobility  Bed Mobility Overal bed mobility: Needs Assistance Bed Mobility: Supine to Sit     Supine to sit: Min guard        Transfers Overall transfer level: Needs assistance Equipment used: (standard walker) Transfers: Sit to/from Stand Sit to Stand: Min guard         General transfer comment: pt used good hand placement to stand  Ambulation/Gait Ambulation/Gait assistance: Min guard Ambulation Distance (Feet): 60 Feet(30 x 2) Assistive device: Standard walker Gait Pattern/deviations: WFL(Within Functional Limits) Gait velocity: light reduction  Gait velocity interpretation: Below normal speed for age/gender     Stairs            Wheelchair Mobility    Modified Rankin (Stroke Patients Only)       Balance Overall balance assessment: History of Falls;Needs assistance Sitting-balance support: Feet  supported Sitting balance-Leahy Scale: Good     Standing balance support: Bilateral upper extremity supported;During functional activity Standing balance-Leahy Scale: Fair                              Cognition Arousal/Alertness: Awake/alert Behavior During Therapy: WFL for tasks assessed/performed Overall Cognitive Status: Within Functional Limits for tasks assessed                                        Exercises General Exercises - Lower Extremity Long Arc Quad: Strengthening;Both;10 reps Heel Slides: Strengthening;Both;10 reps    General Comments        Pertinent Vitals/Pain Pain Assessment: No/denies pain    Home Living                      Prior Function            PT Goals (current goals can now be found in the care plan section) Acute Rehab PT Goals Patient Stated Goal: to get home     Frequency    Min 3X/week      PT Plan Current plan remains appropriate    Co-evaluation              AM-PAC PT "6 Clicks" Daily Activity  Outcome Measure  Difficulty turning over in bed (including adjusting  bedclothes, sheets and blankets)?: A Little Difficulty moving from lying on back to sitting on the side of the bed? : A Little Difficulty sitting down on and standing up from a chair with arms (e.g., wheelchair, bedside commode, etc,.)?: A Little Help needed moving to and from a bed to chair (including a wheelchair)?: A Little Help needed walking in hospital room?: A Little Help needed climbing 3-5 steps with a railing? : A Lot 6 Click Score: 17    End of Session Equipment Utilized During Treatment: Gait belt;Oxygen Activity Tolerance: Patient tolerated treatment well Patient left: in bed;with call bell/phone within reach;with family/visitor present Nurse Communication: Mobility status PT Visit Diagnosis: Unsteadiness on feet (R26.81);Difficulty in walking, not elsewhere classified (R26.2)     Time: 1610-9604 PT  Time Calculation (min) (ACUTE ONLY): 20 min  Charges:  $Gait Training: 8-22 mins                    G Codes:  Functional Assessment Tool Used: AM-PAC 6 Clicks Basic Mobility    Ivar Drape 04/14/2017, 9:53 PM   9:56 PM, 04/14/17 Kathleen [name], PT, MS Physical Therapist - [place] [phone] (Office)

## 2017-04-14 NOTE — Progress Notes (Signed)
Rn called to room.  Patient's daughter at bedside.  Patient reported mid-sternal chest pain radiating down neck.  VS obtained and WNL.  Nitro PRN given.  Dr. Arbutus Leas notified via text page.

## 2017-04-14 NOTE — Progress Notes (Signed)
Late entry:  Patient resting in bed.  Reports chest pain and neck pain improved.  Patient's daughter at bedside.

## 2017-04-14 NOTE — Progress Notes (Signed)
PROGRESS NOTE  Kathleen [name]:[id number] DOB: 1924-02-14 DOA: 04/11/2017 PCP: Elfredia Nevins, MD  Brief History61  84 year old female with a history of systolic CHF with EF 40-45%, complete heart block status post permanent pacemaker, atrial fibrillation on apixaban, and CKD stage III presenting with 1 week history of shortness of breath, dry cough, and increasing ankle edema.  Chest x-ray showed new bilateral pleural effusion, left greater than right with pulmonary vascular congestion.  The patient has been compliant with her diet, fluid restriction, and furosemide.  The patient was noted to have oxygen saturation of 88% on room air in the emergency department.  The patient was started on intravenous furosemide after admission with improvement clinically.  Echocardiogram on 04/12/2017 showed EF 25-30% with akinesis of the entire inferior myocardium.  Cardiology was consulted to assist with management.  Assessment/Plan: Acute respiratory failure with hypoxia -Secondary to CHF -Presently stable on 2 L -will need ambulatory pulse ox prior to d/c -personally reviewed CXR--bilateral pleural effusion  Acute on chronic systolic and diastolic CHF -04/12/2017 echo EF 25-30%, HK of the entire inferior myocardium, PAS P 43. -EF has gone down since 05/01/2015 -Cardiology consult -remains clinically fluid overloaded -Continue furosemide IV -Continue metoprolol succinate -Continue losartan -wt stable since admission -NEG 800 cc -personally reviewed EKG --paced  Chronic atrial fibrillation -Rate controlled -Continue amiodarone -Continue apixaban -Continue metoprolol succinate  CKD stage III -Baseline creatinine 1.1-1.4 -Monitor with diuresis  Complete Heart Block -s/p PPM  Hyponatremia -Secondary to CHF -Improving with diuresis  Failure to thrive/poor po intake -in part due to congestive gastropathy from low flow state -improving with  diuresis  Hyperlipidemia -continue statin    Disposition Plan:   Home when cleared by cardiology Family Communication:   Daughter updated at bedside 04/14/17   Consultants:  cardiology  Code Status:  FULL / DNR  DVT Prophylaxis:  [place] Heparin / [place] Lovenox   Procedures: As Listed in Progress Note Above  Antibiotics: None     Subjective: Patient states that her breathing overall is improved but continues to have some dyspnea on exertion.  Denies any fevers, chills,  nausea, vomiting, diarrhea, abdominal pain, dysuria, hematuria.  She had some precordial chest pain at rest this afternoon, but no chest pain with performing physical therapy.  Objective: Vitals:   04/13/17 2122 04/14/17 0550 04/14/17 1406 04/14/17 1437  BP: 129/71 121/68 124/66 121/71  Pulse: 70 71 71 70  Resp: 20  20 18   Temp: 97.9 F (36.6 C) (!) 97.5 F (36.4 C) 97.8 F (36.6 C)   TempSrc: Oral Oral Oral   SpO2: 98% 97% 96% 99%  Weight:  60.8 kg (134 lb 0.6 oz)    Height:        Intake/Output Summary (Last 24 hours) at 04/14/2017 1629 Last data filed at 04/14/2017 1407 Gross per 24 hour  Intake 480 ml  Output 650 ml  Net -170 ml   Weight change: 0.471 kg (1 lb 0.6 oz) Exam:   General:  Pt is alert, follows commands appropriately, not in acute distress  HEENT: No icterus, No thrush, No neck mass, [place]/AT  Cardiovascular: RRR, S1/S2, no rubs, no gallops  Respiratory: Bibasilar crackles.  No wheezing.  Good air movement.  Abdomen: Soft/+BS, non tender, non distended, no guarding  Extremities: !+LE edema, No lymphangitis, No petechiae, No rashes, no synovitis   Data Reviewed: I have personally reviewed following labs and imaging studies Basic Metabolic Panel: Recent  Labs  Lab 04/11/17 1221 04/12/17 0645 04/13/17 0608 04/14/17 0558  NA 125* 129* 130* 128*  K 4.5 4.2 4.9 4.9  CL 86* 89* 90* 88*  CO2 26 29 29 30   GLUCOSE 211* 97 102* 139*  BUN 40* 35* 35* 40*  CREATININE  1.42* 1.32* 1.24* 1.28*  CALCIUM 9.0 8.8* 8.8* 8.7*   Liver Function Tests: Recent Labs  Lab 04/11/17 1221 04/12/17 0645 04/13/17 0608  AST 33 25 23  ALT 24 22 22   ALKPHOS 118 105 105  BILITOT 1.4* 1.4* 1.1  PROT 6.6 6.0* 6.0*  ALBUMIN 3.5 3.3* 3.1*   No results for input(s): LIPASE, AMYLASE in the last 168 hours. No results for input(s): AMMONIA in the last 168 hours. Coagulation Profile: No results for input(s): INR, PROTIME in the last 168 hours. CBC: Recent Labs  Lab 04/11/17 1221 04/12/17 0645  WBC 8.0 7.5  NEUTROABS 6.9  --   HGB 13.1 12.7  HCT 39.7 38.8  MCV 103.7* 104.0*  PLT 244 191   Cardiac Enzymes: Recent Labs  Lab 04/11/17 1221  TROPONINI 0.03*   BNP: Invalid input(s): POCBNP CBG: Recent Labs  Lab 04/11/17 1645 04/12/17 0721  GLUCAP 144* 87   HbA1C: No results for input(s): HGBA1C in the last 72 hours. Urine analysis:    Component Value Date/Time   COLORURINE YELLOW 03/24/2017 1452   APPEARANCEUR CLEAR 03/24/2017 1452   LABSPEC 1.018 03/24/2017 1452   PHURINE 5.0 03/24/2017 1452   GLUCOSEU NEGATIVE 03/24/2017 1452   HGBUR NEGATIVE 03/24/2017 1452   BILIRUBINUR NEGATIVE 03/24/2017 1452   KETONESUR NEGATIVE 03/24/2017 1452   PROTEINUR 100 (A) 03/24/2017 1452   UROBILINOGEN 0.2 10/16/2013 0910   NITRITE NEGATIVE 03/24/2017 1452   LEUKOCYTESUR TRACE (A) 03/24/2017 1452   Sepsis Labs: @LABRCNTIP (procalcitonin:4,lacticidven:4) )No results found for this or any previous visit (from the past 240 hour(s)).   Scheduled Meds: . amiodarone  50 mg Oral Daily  . apixaban  2.5 mg Oral BID  . feeding supplement (ENSURE ENLIVE)  237 mL Oral TID BM  . ferrous sulfate  325 mg Oral Q breakfast  . furosemide  40 mg Intravenous BID  . losartan  12.5 mg Oral Daily  . lubiprostone  24 mcg Oral BID WC  . metoprolol succinate  75 mg Oral Daily  . pravastatin  40 mg Oral q1800   Continuous Infusions:  Procedures/Studies: Dg Chest 2 View  Result  Date: 04/11/2017 CLINICAL DATA:  Dyspnea. EXAM: CHEST - 2 VIEW COMPARISON:  11/08/2016. FINDINGS: Left chest wall pacer device is noted with leads in the right atrial appendage and right ventricle. Cardiac enlargement noted. Aortic atherosclerosis noted. New moderate left pleural effusion and small right pleural effusion. Pulmonary vascular congestion is identified. Chronic opacities within the right upper lung zone appears similar to previous exam. IMPRESSION: New bilateral pleural effusions, left greater in right, and pulmonary vascular congestion compatible with CHF. Chronic lung disease. Electronically Signed   By: Signa Kell M.D.   On: 04/11/2017 13:27    Catarina Hartshorn, DO  [facility] Pager [phone]  If 7PM-7AM, please contact night-coverage [email or web address] Password [email or web address] 04/14/2017, 4:29 PM   LOS: 2 days

## 2017-04-14 NOTE — Progress Notes (Signed)
Initial Nutrition Assessment  DOCUMENTATION CODES:  Non-severe (moderate) malnutrition in context of chronic illness  INTERVENTION:  D/C glucerna. Continue Ensure Enlive po BID, each supplement provides 350 kcal and 20 grams of protein  Reviewed low salt diet and gave diet recommendations for weight/muscle maintenance   NUTRITION DIAGNOSIS:  Moderate Malnutrition related to poor appetite, nausea, catabolic illness(Cardiac cachexia/HF ) as evidenced by moderate muscle/fat depletion  GOAL:  Patient will meet greater than or equal to 90% of their needs  MONITOR:  PO intake, Supplement acceptance, Labs, Weight trends, I & O's  REASON FOR ASSESSMENT:  Consult, Malnutrition Screening Tool Assessment of nutrition requirement/status  ASSESSMENT:  84 y/o female PMHx Nonischemic cardiomyopathy, AV block s/p biventricular pacemaker, Afib, DM2, CAD. Presents w/ ankle edema, wt gain, cough and SOB x3 days. Work up reveals elevated BNP, hyponatremia and pt admitted for HF exacerbation   Patient seen with her daughter at bedside. Daughter is main caregiver and provides most of history. She states the patient has a chronic poor appetite. She quickly "gets sick" when eating and, as a result, only "eats a few bites of each item on her plate, then stops". For example, at breakfast, the patient only has a couple tbsps of oatmeal as well as a piece of toast +/- jelly.. The daughter says it typically takes "3 hours" before the patient is able to tolerate another meal.   Daughter reports being compliant with low sodium diet and notes a plethora of examples of recipes incorporating non-salt based seasonings. Pt supplements her diet with 2-3 Ensure Enlives per day-providing [phone] kcals and 40-60 g Pro  Daughter notes pt has declined greatly in just the past 2 years. Weight wise, the patient report base wt being ~119-120. Per chart, she was 118.8 lbs at an ED visit 3 weeks ago. She is currently >130 lbs. She  has noticeable swelling of legs/abdomen. While exact wt loss amount is difficult to estimate she is certainly gradually losing weight. She appears to have been in 130s ~1 year ago. Using her dry wt of 118, she is down 15 lbs x 1 year. 2 years ago was 153 lbs.    Her nausea is likely a symptom of her HF. She does eat high fat items (PB/MAYO sandwich). While a lower fat diet may potentially help w/ gastric emptying, this would also be a lower kcal diet and likely not have any net effect on weight/nutritional status. CSW had noted patient was eating condensed soup and consulted RD for diet ed. The daughter clarified pt only ever eats a couple tbsps of soup. From our conversation, they sound to be quite cognizant of sodium intake. Dont want to be overly restrictive either. Pt seems to be overly concerned about tight BG control (<120). RD educated that it would be far better to worry over kcal/pro intake then these tight BG values.   Physical Exam: Moderate edema to abdomen, lower body. Moderate muscle/fat wasting to upper body.   Labs: Na: 128, Albumin: 3.1, BUN/Creat:40/1.28, Glu:100-140 Meds: Amitiza, Lasix, Iron  NUTRITION - FOCUSED PHYSICAL EXAM:   Most Recent Value  Orbital Region  No depletion  Upper Arm Region  Moderate depletion  Thoracic and Lumbar Region  Moderate depletion  Buccal Region  No depletion  Temple Region  No depletion  Clavicle Bone Region  Moderate depletion  Clavicle and Acromion Bone Region  Moderate depletion  Scapular Bone Region  Unable to assess  Dorsal Hand  Mild depletion  Patellar Region  Unable  to assess  Anterior Thigh Region  Unable to assess  Posterior Calf Region  Unable to assess  Edema (RD Assessment)  Moderate     Diet Order:  Diet heart healthy/carb modified Room service appropriate? Yes; Fluid consistency: Thin; Fluid restriction: 1200 mL Fluid  EDUCATION NEEDS:  No education needs have been identified at this time  Skin:  Skin Assessment:  Reviewed RN Assessment  Last BM:  3/30  Height:  Ht Readings from Last 1 Encounters:  04/11/17 5\' 7"  (1.702 m)   Weight:  Wt Readings from Last 1 Encounters:  04/14/17 134 lb 0.6 oz (60.8 kg)   Wt Readings from Last 10 Encounters:  04/14/17 134 lb 0.6 oz (60.8 kg)  03/24/17 118 lb 12.8 oz (53.9 kg)  09/04/16 123 lb 12.8 oz (56.2 kg)  08/22/16 124 lb 12.8 oz (56.6 kg)  08/20/16 132 lb (59.9 kg)  08/13/16 136 lb 9.6 oz (62 kg)  04/08/16 133 lb 3.2 oz (60.4 kg)  03/12/16 139 lb (63 kg)  11/18/15 141 lb 8.6 oz (64.2 kg)  11/07/15 155 lb 13.8 oz (70.7 kg)   Ideal Body Weight:  61.36 kg  BMI:  Body mass index using dry wt  is 18.6 kg/m.  Using dry wt of 118.8 lbs (54 kg) as dosing wt.  Estimated Nutritional Needs:  Kcal:  1700-1900 (31-35 kcals/kg bw) Protein:  75-85g Pro (1.4-1.6 g/kg bw) Fluid:  1.4-1.6 L (25-30 ml/kg dry wt)  Christophe Louis RD, LDN, CNSC Clinical Nutrition Available Tues-Sat via Pager: [phone] 04/14/2017 12:38 PM

## 2017-04-14 NOTE — Consult Note (Addendum)
Cardiology Consult    Patient ID: Kathleen [name]; [id number]; [birth year]   Admit date: 04/11/2017 Date of Consult: 04/14/2017  Primary Care Provider: Redmond School, MD Primary Cardiologist: Sanda Klein, MD   Patient Profile    Kathleen [name] is a 84 y.o. female with past medical history of high-grade AV block (s/p Medtronic PPM placement in 2013 with Bi-V upgrade and CRT), nonischemic cardiomyopathy (EF 40-45% by echo in 2017), PAF (on Eliquis and Amiodarone), HTN, and HLD who is being seen today for the evaluation of CHF at the request of Dr. Carles Collet.   History of Present Illness    [name] was last examined by Dr. Sallyanne Kuster in 08/2016 following a recent admission for an acute CHF exacerbation in the setting of persistent atrial fibrillation. She underwent a cardioversion during that admission and reported overall doing well at her follow-up visit with no recurrent episodes of atrial fibrillation noted with device interrogation.  She presented to [facility] ED on 04/11/2017 for worsening shortness of breath and lower extremity edema over the past several days.  In talking with the patient and her daughter, she reports having worsening dyspnea on exertion and orthopnea for the past 2 weeks leading up to admission.  She had been following weight on her home scales and this had increased from 117-118 lbs to 129 lbs. Denies any associated chest pain or palpitations. She had previously been on Lasix 40 mg daily but upon touching base with the device clinic, this was increased to 40 mg twice daily on 04/09/2017. Recent device interrogation showed abnormal thoracic impedance suggesting fluid accumulation since 04/02/2017.  Initial labs upon admission showed WBC 8.0, Hgb 13.1, platelets 244, Na+ 125, K+ 4.5, and creatinine 1.42 (close to baseline). BNP elevated to 2655. Initial troponin at 0.03. CXR showed new bilateral pleural effusions and pulmonary vascular congestion compatible with CHF.  EKG showed AV dual-paced rhythm, HR 76. A repeat echocardiogram was obtained which showed a reduced EF of 25-30% with severe diffuse hypokinesis and akinesis of the entire inferior myocardium and akinesis of the apical anterior and apical myocardium with grade 3 diastolic dysfunction.  She was admitted for acute hypoxic respiratory failure in the setting of CHF exacerbation and has been started on IV Lasix 40 mg twice daily. Overall recorded net output has been minimal at -720 cc and weight has been stable at 132-134 lbs (previously 123 lbs in 08/2016).  Repeat BMET today shows kidney function has improved and is at 1.28.   Past Medical History:  Diagnosis Date  . Acid reflux   . 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 CAD (coronary artery disease)   . Chronic kidney disease (CKD), stage III (moderate) (HCC)   . Essential hypertension   . History of pneumonia   . Hyperlipidemia   . Macular degeneration   . Nonischemic cardiomyopathy ([place])   . PAF (paroxysmal atrial fibrillation) ([place])   . Type 2 diabetes mellitus ([place])     Past Surgical History:  Procedure Laterality Date  . Bladder tack  1970's  . CARDIAC CATHETERIZATION  10/15/2011   Normal coronaries  . CARDIOVERSION N/A 10/28/2013   Procedure: CARDIOVERSION;  Surgeon: Sanda Klein, MD;  Location: [facility];  Service: Cardiovascular;  Laterality: N/A;  . CARDIOVERSION N/A 08/20/2016   Procedure: CARDIOVERSION;  Surgeon: Sanda Klein, MD;  Location: [place];  Service: Cardiovascular;  Laterality: N/A;  . CHOLECYSTECTOMY  1999  . INTRAMEDULLARY (IM) NAIL INTERTROCHANTERIC  Right 11/03/2015   Procedure: INTRAMEDULLARY (IM) NAIL RIGHT INTERTROCHANTERIC HIP FRACTURE;  Surgeon: Leandrew Koyanagi, MD;  Location: [place];  Service: Orthopedics;  Laterality: Right;  . LEFT AND RIGHT HEART CATHETERIZATION WITH CORONARY ANGIOGRAM N/A 10/15/2011   Procedure: LEFT AND RIGHT HEART CATHETERIZATION WITH CORONARY ANGIOGRAM;   Surgeon: Pixie Casino, MD;  Location: [facility] CATH LAB;  Service: Cardiovascular;  Laterality: N/A;  . NM MYOCAR PERF WALL MOTION  03/15/2009   Normal  . PACEMAKER INSERTION  10/16/2011   Medtronic  . PERMANENT PACEMAKER INSERTION N/A 03/24/2011   Procedure: PERMANENT PACEMAKER INSERTION;  Surgeon: Sanda Klein, MD;  Location: [place] CATH LAB;  Service: Cardiovascular;  Laterality: N/A;  . REPLACEMENT TOTAL KNEE       Home Medications:  Prior to Admission medications   Medication Sig Start Date End Date Taking? Authorizing Provider  acetaminophen (TYLENOL) 325 MG tablet Take 650 mg by mouth every 4 (four) hours as needed for mild pain or moderate pain.    Yes [provider]  amiodarone (PACERONE) 100 MG tablet Take 50 mg by mouth daily.   Yes [provider]  AMITIZA 24 MCG capsule TAKE (1) CAPSULE BY MOUTH TWICE DAILY. 02/25/17  Yes Setzer, Terri L, NP  Coenzyme Q10 (CO Q 10 PO) Take 1 tablet by mouth daily.   Yes [provider]  DM-Doxylamine-Acetaminophen (NYQUIL COLD & FLU PO) Take 1 capsule by mouth at bedtime as needed (sleep).   Yes [provider]  ELIQUIS 2.5 MG TABS tablet TAKE ONE TABLET BY MOUTH TWICE DAILY. 02/16/17  Yes Croitoru, Mihai, MD  ferrous sulfate 325 (65 FE) MG tablet Take 325 mg by mouth daily with breakfast.    Yes [provider]  furosemide (LASIX) 40 MG tablet Take 40 mg (1 tablet) daily. If weight increases by 3 lbs in 1 day or 5 lbs in 1 week, take a second 40 mg (1 tablet). 08/22/16  Yes Duke, Tami Lin, PA  glimepiride (AMARYL) 1 MG tablet Take 0.5 mg by mouth daily as needed (high blood sugars). Take 0.5 tablet (0.5 mg total) by mouth daily if CBG is >120.   Yes [provider]  losartan (COZAAR) 25 MG tablet TAKE 1 TABLET BY MOUTH ONCE DAILY. 03/19/17  Yes Croitoru, Mihai, MD  metoprolol succinate (TOPROL-XL) 25 MG 24 hr tablet Take 3 tablets (75 mg total) by mouth daily. Patient taking differently: Take 75 mg  by mouth 2 (two) times daily.  09/09/16  Yes Croitoru, Mihai, MD  Multiple Vitamins-Minerals (ICAPS AREDS 2) CAPS Take 1 capsule by mouth twice daily   Yes [provider]  nitroGLYCERIN (NITROSTAT) 0.4 MG SL tablet DISSOLVE 1 TABLET UNDER TONGUE EVERY 5 MINUTES UP TO 15 MIN FOR CHESTPAIN. IF NO RELIEF CALL 911. 12/25/14  Yes Croitoru, Mihai, MD  ondansetron (ZOFRAN) 4 MG tablet Take 4 mg by mouth every 4 (four) hours as needed for nausea or vomiting.   Yes [provider]  potassium chloride SA (K-DUR,KLOR-CON) 20 MEQ tablet Take 1 capsule (20 mEq) daily. Take an extra 1 capsule (20 mEq) on days you take an extra lasix. 08/22/16  Yes Duke, Tami Lin, PA  pravastatin (PRAVACHOL) 40 MG tablet TAKE 1 TABLET BY MOUTH EACH EVENING. 02/16/17  Yes Croitoru, Mihai, MD  traMADol (ULTRAM) 50 MG tablet Take 1 tablet (50 mg total) by mouth every 8 (eight) hours as needed. 12/15/16  Yes Croitoru, Mihai, MD  benazepril (LOTENSIN) 40 MG tablet Take 40  mg by mouth 2 (two) times daily.  03/23/11  [provider]    Inpatient Medications: Scheduled Meds: . amiodarone  50 mg Oral Daily  . apixaban  2.5 mg Oral BID  . feeding supplement (ENSURE ENLIVE)  237 mL Oral TID BM  . ferrous sulfate  325 mg Oral Q breakfast  . furosemide  40 mg Intravenous BID  . losartan  12.5 mg Oral Daily  . lubiprostone  24 mcg Oral BID WC  . metoprolol succinate  75 mg Oral Daily  . pravastatin  40 mg Oral q1800   Continuous Infusions:  PRN Meds: acetaminophen, nitroGLYCERIN, ondansetron, polyethylene glycol, traMADol  Allergies:    Allergies  Allergen Reactions  . Coumadin [Warfarin Sodium] Other (See Comments)    Caused Patient to Bleed Out.   . Meloxicam Other (See Comments)    Unknown  . Metformin And Related Other (See Comments)    Cannot have due to kidney function  . Morphine Itching    Not in right state of mind.   . Nabumetone Nausea And Vomiting  . Oxycodone Nausea And Vomiting  .  Sulfonamide Derivatives Hives    Social History:   Social History   Socioeconomic History  . Marital status: Widowed    Spouse name: Not on file  . Number of children: Not on file  . Years of education: Not on file  . Highest education level: Not on file  Occupational History  . Occupation: retired  Scientific laboratory technician  . Financial resource strain: Not on file  . Food insecurity:    Worry: Not on file    Inability: Not on file  . Transportation needs:    Medical: Not on file    Non-medical: Not on file  Tobacco Use  . Smoking status: Never Smoker  . Smokeless tobacco: Never Used  Substance and Sexual Activity  . Alcohol use: No  . Drug use: No  . Sexual activity: Not Currently  Lifestyle  . Physical activity:    Days per week: Not on file    Minutes per session: Not on file  . Stress: Not on file  Relationships  . Social connections:    Talks on phone: Not on file    Gets together: Not on file    Attends religious service: Not on file    Active member of club or organization: Not on file    Attends meetings of clubs or organizations: Not on file    Relationship status: Not on file  . Intimate partner violence:    Fear of current or ex partner: Not on file    Emotionally abused: Not on file    Physically abused: Not on file    Forced sexual activity: Not on file  Other Topics Concern  . Not on file  Social History Narrative  . Not on file     Family History:    Family History  Problem Relation Age of Onset  . Suicidality Mother   . Heart murmur Daughter   . Heart attack Brother       Review of Systems    General:  No chills, fever, night sweats or weight changes.  Cardiovascular:  No chest pain, palpitations, paroxysmal nocturnal dyspnea. Positive for dyspnea on exertion, orthopnea, and edema.  Dermatological: No rash, lesions/masses Respiratory: No cough, dyspnea Urologic: No hematuria, dysuria Abdominal:   No nausea, vomiting, diarrhea, bright red blood  per rectum, melena, or hematemesis Neurologic:  No visual changes, wkns, changes  in mental status. All other systems reviewed and are otherwise negative except as noted above.  Physical Exam/Data    Vitals:   04/13/17 2122 04/14/17 0550 04/14/17 1406 04/14/17 1437  BP: 129/71 121/68 124/66 121/71  Pulse: 70 71 71 70  Resp: _0 Temp: 97.9 F (36.6 C) (!) 97.5 F (36.4 C) 97.8 F (36.6 C)   TempSrc: Oral Oral Oral   SpO2: 98% 97% 96% 99%  Weight:  134 lb 0.6 oz (60.8 kg)    Height:        Intake/Output Summary (Last 24 hours) at 04/14/2017 1652 Last data filed at 04/14/2017 1407 Gross per 24 hour  Intake 480 ml  Output 650 ml  Net -170 ml   Filed Weights   04/12/17 0548 04/13/17 0620 04/14/17 0550  Weight: 133 lb 9.6 oz (60.6 kg) 133 lb (60.3 kg) 134 lb 0.6 oz (60.8 kg)   Body mass index is 20.99 kg/m.   General: Pleasant, elderly Caucasian female appearing in NAD Psych: Normal affect. Neuro: Alert and oriented X 3. Moves all extremities spontaneously. HEENT: Normal  Neck: Supple without bruits or JVD. Lungs:  Resp regular and unlabored, decreased breath sounds along bases bilaterally. Heart: RRR no s3, s4, or murmurs. Abdomen: Soft, non-tender, non-distended, BS + x 4.  Extremities: No clubbing or cyanosis, trace lower extremity edema. DP/PT/Radials 2+ and equal bilaterally.  Telemetry:  Telemetry was personally reviewed and demonstrates:  AV pacing, HR in 70's.    Labs/Studies     Relevant CV Studies:  Echocardiogram: 04/2015 Study Conclusions  - Left ventricle: The cavity size was normal. Wall thickness was   increased in a pattern of mild LVH. Systolic function was mildly   to moderately reduced. The estimated ejection fraction was in the   range of 40% to 45%. There is akinesis of the inferolateral and   inferior myocardium. Doppler parameters are consistent with   restrictive physiology, indicative of decreased left ventricular   diastolic  compliance and/or increased left atrial pressure. - Aortic valve: Trileaflet; mildly calcified leaflets. - Mitral valve: Calcified annulus. There was moderate   regurgitation. - Left atrium: The atrium was severely dilated. - Right ventricle: Pacer wire or catheter noted in right ventricle. - Right atrium: The atrium was mildly dilated. - Tricuspid valve: There was mild regurgitation. - Pulmonary arteries: PA peak pressure: 49 mm Hg (S). - Pericardium, extracardiac: There was no pericardial effusion.  Impressions:  - Mild LVH with LVEF approximately 40-45%. There is akinesis of the   inferolateral wall. Restrictive diastolic filling pattern with   increased LV filling pressure. Severe left atrial enlargement.   MAC with moderate mitral regurgitation. Mildly sclerotic aortic   valve. Device wire noted within the right heart. Mild tricuspid   regurgitation with PASP 49 mmHg.   Echocardiogram: 04/12/2017 Study Conclusions  - Left ventricle: The cavity size was mildly dilated. Systolic   function was severely reduced. The estimated ejection fraction   was in the range of 25% to 30%. Severe diffuse hypokinesis with   distinct regional wall motion abnormalities. There is akinesis of   the entireinferior myocardium. There is akinesis of the   apicalanterior and apical myocardium. There was a reduced   contribution of atrial contraction to ventricular filling, due to   increased ventricular diastolic pressure or atrial contractile   dysfunction. Doppler parameters are consistent with a reversible   restrictive pattern, indicative of decreased left ventricular   diastolic compliance and/or increased  left atrial pressure (grade   3 diastolic dysfunction). Doppler parameters are consistent with   high ventricular filling pressure. - Mitral valve: Calcified annulus. Mildly thickened leaflets .   There was mild regurgitation. - Left atrium: The atrium was mildly dilated. - Pulmonary  arteries: PA peak pressure: 43 mm Hg (S). - Pericardium, extracardiac: A small, free-flowing pericardial   effusion was identified along the right ventricular free wall and   along the right atrial free wall. The fluid had no internal   echoes.There was no evidence of hemodynamic compromise. There was   a left pleural effusion.  Impressions:  - The right ventricular systolic pressure was increased consistent   with moderate pulmonary hypertension.  Laboratory Data:  Chemistry Recent Labs  Lab 04/12/17 0645 04/13/17 0608 04/14/17 0558  NA 129* 130* 128*  K 4.2 4.9 4.9  CL 89* 90* 88*  CO2 _0 GLUCOSE 97 102* 139*  BUN 35* 35* 40*  CREATININE 1.32* 1.24* 1.28*  CALCIUM 8.8* 8.8* 8.7*  GFRNONAA 34* 37* 36*  GFRAA 40* 43* 41*  ANIONGAP _1 Recent Labs  Lab 04/11/17 1221 04/12/17 0645 04/13/17 0608  PROT 6.6 6.0* 6.0*  ALBUMIN 3.5 3.3* 3.1*  AST 33 25 23  ALT _2 ALKPHOS 118 105 105  BILITOT 1.4* 1.4* 1.1   Hematology Recent Labs  Lab 04/11/17 1221 04/12/17 0645  WBC 8.0 7.5  RBC 3.83* 3.73*  HGB 13.1 12.7  HCT 39.7 38.8  MCV 103.7* 104.0*  MCH 34.2* 34.0  MCHC 33.0 32.7  RDW 17.8* 17.7*  PLT 244 191   Cardiac Enzymes Recent Labs  Lab 04/11/17 1221  TROPONINI 0.03*   No results for input(s): TROPIPOC in the last 168 hours.  BNP Recent Labs  Lab 04/11/17 1221  BNP 2,655.0*    DDimer No results for input(s): DDIMER in the last 168 hours.  Radiology/Studies:  Dg Chest 2 View  Result Date: 04/11/2017 CLINICAL DATA:  Dyspnea. EXAM: CHEST - 2 VIEW COMPARISON:  11/08/2016. FINDINGS: Left chest wall pacer device is noted with leads in the right atrial appendage and right ventricle. Cardiac enlargement noted. Aortic atherosclerosis noted. New moderate left pleural effusion and small right pleural effusion. Pulmonary vascular congestion is identified. Chronic opacities within the right upper lung zone appears similar to previous exam.  IMPRESSION: New bilateral pleural effusions, left greater in right, and pulmonary vascular congestion compatible with CHF. Chronic lung disease. Electronically Signed   By: Kerby Moors M.D.   On: 04/11/2017 13:27     Assessment & Plan    1. Acute on Chronic Combined Systolic and Diastolic CHF - The patient has a known reduced EF of 40-45% by echo in 2017 with repeat imaging this admission showing her EF is further reduced to 25-30% with wall motion abnormalities as outlined above. - She presented with worsening dyspnea on exertion, orthopnea, and edema over the past 3 weeks with an associated weight gain of over 10 pounds on her home scales.BNP elevated to 2655 on admission with CXR showing new bilateral pleural effusions and pulmonary vascular congestion. - she has been diuresing with IV Lasix 67m BID and has minimal recorded net output of -720 cc with weight stable at 132-134 lbs but notes significant improvement in her symptoms. Volume status appears improved by examination. Would continue with IV Lasix for now as kidney function remains stable. Her baseline PTA Lasix dosing was 453mdaily. Her daughter reports she  does not like to take BID dosing due to frequent urination. May need to discharge on 43m daily with possible titration to 475mBID pending her clinical status.  - continue Toprol-XL and ARB therapy in the setting of her cardiomyopathy. Not an ideal Entresto candidate given her advanced age and soft BP.   2. High-Grade AV Block - s/p Medtronic PPM placement in 2013 with Bi-V upgrade and CRT. - AV paced on telemetry with HR in the 70's.   3. PAF - s/p DCCV in 08/2016. Remains on low-dose Amiodarone 5056maily.  - She denies any evidence of active bleeding.  Continue Eliquis 2.5 mg twice daily for anticoagulation.  4. HTN -BP has been well controlled at 113/66-129/71 within the past 24 hours. - continue Losartan 12.5mg33mily (can titrate back to PTA dosing of 25mg36mly at time  of discharge)  and Toprol-XL 75mg 68my.    For questions or updates, please contact [facility] consult [email or web address] for contact info under Cardiology/STEMI.  Signed, BrittaBernerd Pho 04/14/2017, 4:52 PM Pager: [phone] note:  Patient seen and examined.  I reviewed records and discussed the case with Ms. StradeAhmed Prima  Ms. SouthaSchollernts with increasing shortness of breath and weight gain in the setting of cardiomyopathy and acute on chronic combined heart failure.  She has had recent escalating outpatient doses of Lasix.  She does not report any palpitations. She states that she has been taking her medications as directed.  Recent echocardiogram shows further deterioration in LVEF, now in the range of 25-30% in comparison to 2017 when it was in the 40-45% range.  On examination she appears comfortable.  Heart rate is in the 70s with ventricular pacing evident by telemetry.  Systolic blood pressure is in the 120s.  Lungs exhibit decreased breath sounds at the bases consistent with effusions, no wheezing.  Cardiac exam reveals RRR with 2/6 systolic murmur, no gallop.  She has trace ankle edema.  Lab work shows sodium 128, BUN 40, creatinine 1.28, hemoglobin 12.7, platelets 191.  ECG shows ventricular pacing.  Chest x-ray shows pulmonary vascular congestion and bilateral pleural effusions, left greater than right.  Acute on chronic combined heart failure with LVEF 25-30% range by recent follow-up echocardiogram.  Agree with transition to IV Lasix for now.  Otherwise continue on Toprol-XL, Cozaar, amiodarone, and Eliquis.  Diuresis has not been vigorous as yet, although intake and output not complete.  Continue to follow clinically.  SamuelSatira Sark, F.A.C.C.

## 2017-04-15 LAB — BASIC METABOLIC PANEL
Anion gap: 13 (ref 5–15)
BUN: 41 mg/dL — AB (ref 6–20)
CALCIUM: 9.1 mg/dL (ref 8.9–10.3)
CHLORIDE: 85 mmol/L — AB (ref 101–111)
CO2: 30 mmol/L (ref 22–32)
CREATININE: 1.39 mg/dL — AB (ref 0.44–1.00)
GFR calc Af Amer: 37 mL/min — ABNORMAL LOW (ref >60)
GFR, EST NON AFRICAN AMERICAN: 32 mL/min — AB (ref >60)
GLUCOSE: 185 mg/dL — AB (ref 65–99)
POTASSIUM: 4.6 mmol/L (ref 3.5–5.1)
Sodium: 128 mmol/L — ABNORMAL LOW (ref 135–145)

## 2017-04-15 LAB — BRAIN NATRIURETIC PEPTIDE: B NATRIURETIC PEPTIDE 5: 2186 pg/mL — AB (ref 0.0–100.0)

## 2017-04-15 MED ORDER — SACUBITRIL-VALSARTAN 24-26 MG PO TABS
1.0000 | ORAL_TABLET | Freq: Two times a day (BID) | ORAL | Status: DC
  Administered 2017-04-16 – 2017-04-17 (×3): 1 via ORAL
  Filled 2017-04-15 (×9): qty 1

## 2017-04-15 NOTE — Care Management Important Message (Signed)
Important Message  Patient Details  Name: Kathleen [name] MRN: [id number] Date of Birth: [birth year]   Medicare Important Message Given:  Yes    Renie Ora 04/15/2017, 11:29 AM

## 2017-04-15 NOTE — Progress Notes (Addendum)
PROGRESS NOTE    Kathleen [name]  RJP:[id number] DOB: [birth year] DOA: 04/11/2017 PCP: Elfredia Nevins, MD     Brief Narrative:  84 year old woman admitted from home on 3/30 due to shortness of breath and increasing ankle edema.  She has a history of congestive heart failure with a known ejection fraction of 40-45%, complete heart block status post permanent pacemaker implantation, atrial fibrillation maintained on chronic anticoagulation with apixaban, stage III chronic kidney disease.  Per patient and daughter she has been compliant with her diet, fluid restriction and medications.  Cardiology is following.   Assessment & Plan:   Active Problems:   CKD (chronic kidney disease) stage 3, GFR 30-59 ml/min (HCC)   Acute on chronic combined systolic and diastolic CHF, NYHA class 1 (HCC)   CHF (congestive heart failure) (HCC)   Atrial fibrillation, chronic (HCC)   Failure to thrive in adult   Acute hypoxemic respiratory failure -Suspect due to acute systolic heart failure exacerbation -Stable on 2 L of oxygen. -We will need to assess oxygen necessity prior to discharge.  Acute on chronic systolic heart failure -Echocardiogram on 3/31 shows decline in ejection fraction from 40% to 25-30% with akinesis of the inferior myocardium. -Plan to continue IV diuresis for now with Lasix 40 mg twice daily. -Patient still shows signs of volume overload on exam with ankle edema and crackles on lung auscultation. -Continue metoprolol. -She does have chronic kidney disease, however this is stable.  She has not been on an ACE inhibitor in the past 36 hours, will discontinue losartan and start Entresto low-dose with close following of renal function.  Suspect may be able to decrease dose of Lasix tomorrow given Sherryll Burger has somewhat of a diuretic effect.  Complete heart block -Status post pacemaker.  Paroxysmal atrial fibrillation -Remains on low-dose amiodarone. -Continue Eliquis for  anticoagulation.  Hypertension -Well-controlled  Stage III chronic kidney disease -Baseline creatinine between 1.2 and 1.4 -Monitor with diuresis and addition of Entresto.  Failure to thrive/protein caloric malnutrition -Request dietitian evaluation  Hyperlipidemia -Continue statin   DVT prophylaxis: Eliquis Code Status: DNR Family Communication: Daughter at bedside updated on plan of care and all questions answered Disposition Plan: Home with home health pending improvement in heart failure  Consultants:   Cardiology  Procedures:   2D echo as above  Antimicrobials:  Anti-infectives (From admission, onward)   None       Subjective: Still feels very weak, would like to have oxygen at home, denies chest pain  Objective: Vitals:   04/14/17 2004 04/15/17 0511 04/15/17 0908 04/15/17 1503  BP: 118/65 111/66  119/79  Pulse: 73 70  70  Resp:    16  Temp: 97.6 F (36.4 C) 97.8 F (36.6 C)  97.7 F (36.5 C)  TempSrc: Oral Oral  Oral  SpO2: 98% 99%  100%  Weight:  61.5 kg (135 lb 9.3 oz) 60.4 kg (133 lb 1.6 oz)   Height:        Intake/Output Summary (Last 24 hours) at 04/15/2017 1747 Last data filed at 04/15/2017 1412 Gross per 24 hour  Intake 1440 ml  Output 550 ml  Net 890 ml   Filed Weights   04/14/17 0550 04/15/17 0511 04/15/17 0908  Weight: 60.8 kg (134 lb 0.6 oz) 61.5 kg (135 lb 9.3 oz) 60.4 kg (133 lb 1.6 oz)    Examination:  General exam: Alert, awake, oriented x 3, frail, cachectic Respiratory system: Bibasilar crackles Cardiovascular system:RRR. No murmurs, rubs, gallops. Gastrointestinal system: Abdomen  is nondistended, soft and nontender. No organomegaly or masses felt. Normal bowel sounds heard. Central nervous system: Alert and oriented. No focal neurological deficits. Extremities: 1-2+ pitting edema around feet and ankle bilaterally Skin: No rashes, lesions or ulcers Psychiatry: Judgement and insight appear normal. Mood & affect appropriate.      Data Reviewed: I have personally reviewed following labs and imaging studies  CBC: Recent Labs  Lab 04/11/17 1221 04/12/17 0645  WBC 8.0 7.5  NEUTROABS 6.9  --   HGB 13.1 12.7  HCT 39.7 38.8  MCV 103.7* 104.0*  PLT 244 191   Basic Metabolic Panel: Recent Labs  Lab 04/11/17 1221 04/12/17 0645 04/13/17 0608 04/14/17 0558 04/15/17 0944  NA 125* 129* 130* 128* 128*  K 4.5 4.2 4.9 4.9 4.6  CL 86* 89* 90* 88* 85*  CO2 26 29 29 30 30   GLUCOSE 211* 97 102* 139* 185*  BUN 40* 35* 35* 40* 41*  CREATININE 1.42* 1.32* 1.24* 1.28* 1.39*  CALCIUM 9.0 8.8* 8.8* 8.7* 9.1   GFR: Estimated Creatinine Clearance: 25.6 mL/min (A) (by C-G formula based on SCr of 1.39 mg/dL (H)). Liver Function Tests: Recent Labs  Lab 04/11/17 1221 04/12/17 0645 04/13/17 0608  AST 33 25 23  ALT 24 22 22   ALKPHOS 118 105 105  BILITOT 1.4* 1.4* 1.1  PROT 6.6 6.0* 6.0*  ALBUMIN 3.5 3.3* 3.1*   No results for input(s): LIPASE, AMYLASE in the last 168 hours. No results for input(s): AMMONIA in the last 168 hours. Coagulation Profile: No results for input(s): INR, PROTIME in the last 168 hours. Cardiac Enzymes: Recent Labs  Lab 04/11/17 1221  TROPONINI 0.03*   BNP (last 3 results) No results for input(s): PROBNP in the last 8760 hours. HbA1C: No results for input(s): HGBA1C in the last 72 hours. CBG: Recent Labs  Lab 04/11/17 1645 04/12/17 0721  GLUCAP 144* 87   Lipid Profile: No results for input(s): CHOL, HDL, LDLCALC, TRIG, CHOLHDL, LDLDIRECT in the last 72 hours. Thyroid Function Tests: No results for input(s): TSH, T4TOTAL, FREET4, T3FREE, THYROIDAB in the last 72 hours. Anemia Panel: No results for input(s): VITAMINB12, FOLATE, FERRITIN, TIBC, IRON, RETICCTPCT in the last 72 hours. Urine analysis:    Component Value Date/Time   COLORURINE YELLOW 03/24/2017 1452   APPEARANCEUR CLEAR 03/24/2017 1452   LABSPEC 1.018 03/24/2017 1452   PHURINE 5.0 03/24/2017 1452   GLUCOSEU  NEGATIVE 03/24/2017 1452   HGBUR NEGATIVE 03/24/2017 1452   BILIRUBINUR NEGATIVE 03/24/2017 1452   KETONESUR NEGATIVE 03/24/2017 1452   PROTEINUR 100 (A) 03/24/2017 1452   UROBILINOGEN 0.2 10/16/2013 0910   NITRITE NEGATIVE 03/24/2017 1452   LEUKOCYTESUR TRACE (A) 03/24/2017 1452   Sepsis Labs: @LABRCNTIP (procalcitonin:4,lacticidven:4)  )No results found for this or any previous visit (from the past 240 hour(s)).       Radiology Studies: No results found.      Scheduled Meds: . amiodarone  50 mg Oral Daily  . apixaban  2.5 mg Oral BID  . feeding supplement (ENSURE ENLIVE)  237 mL Oral TID BM  . ferrous sulfate  325 mg Oral Q breakfast  . furosemide  40 mg Intravenous BID  . losartan  12.5 mg Oral Daily  . lubiprostone  24 mcg Oral BID WC  . metoprolol succinate  75 mg Oral Daily  . pravastatin  40 mg Oral q1800   Continuous Infusions:   LOS: 3 days    Time spent: 35 minutes. Greater than 50% of this time  was spent in direct contact with the patient coordinating care.     Chaya Jan, MD [facility] Pager [phone]  If 7PM-7AM, please contact night-coverage [email or web address] Password [facility] 04/15/2017, 5:47 PM

## 2017-04-15 NOTE — Progress Notes (Addendum)
Progress Note  Patient Name: JACQUES NICOLAY Date of Encounter: 04/15/2017  Primary Cardiologist: Thurmon Fair, MD   Subjective   Denies any orthopnea or PND overnight. Experienced dyspnea this morning when walking to the restroom and washing up. Denies any chest pain or palpitations.   Inpatient Medications    Scheduled Meds: . amiodarone  50 mg Oral Daily  . apixaban  2.5 mg Oral BID  . feeding supplement (ENSURE ENLIVE)  237 mL Oral TID BM  . ferrous sulfate  325 mg Oral Q breakfast  . furosemide  40 mg Intravenous BID  . losartan  12.5 mg Oral Daily  . lubiprostone  24 mcg Oral BID WC  . metoprolol succinate  75 mg Oral Daily  . pravastatin  40 mg Oral q1800    PRN Meds: acetaminophen, nitroGLYCERIN, ondansetron, polyethylene glycol, traMADol   Vital Signs    Vitals:   04/14/17 1437 04/14/17 1901 04/14/17 2004 04/15/17 0511  BP: 121/71 126/73 118/65 111/66  Pulse: 70 73 73 70  Resp: 18     Temp:   97.6 F (36.4 C) 97.8 F (36.6 C)  TempSrc:   Oral Oral  SpO2: 99%  98% 99%  Weight:    135 lb 9.3 oz (61.5 kg)  Height:        Intake/Output Summary (Last 24 hours) at 04/15/2017 0828 Last data filed at 04/15/2017 0600 Gross per 24 hour  Intake 960 ml  Output 650 ml  Net 310 ml   Filed Weights   04/13/17 0620 04/14/17 0550 04/15/17 0511  Weight: 133 lb (60.3 kg) 134 lb 0.6 oz (60.8 kg) 135 lb 9.3 oz (61.5 kg)    Telemetry    AV paced, HR in 70's.  - Personally Reviewed  ECG    AV paced, HR 70 - Personally Reviewed  Physical Exam   General: Well developed, well nourished elderly Caucasian female appearing in no acute distress. Head: Normocephalic, atraumatic.  Neck: Supple without bruits, JVD not elevated. Lungs:  Resp regular and unlabored, mild rales along bases bilaterally. Heart: RRR, S1, S2, no S3, S4, no rub. Abdomen: Soft, non-tender, non-distended with normoactive bowel sounds. No hepatomegaly. No rebound/guarding. No obvious abdominal  masses. Extremities: No clubbing or cyanosis, trace lower extremity edema. Distal pedal pulses are 2+ bilaterally. Neuro: Alert and oriented X 3. Moves all extremities spontaneously. Psych: Normal affect.  Labs    Chemistry Recent Labs  Lab 04/11/17 1221 04/12/17 0645 04/13/17 0608 04/14/17 0558  NA 125* 129* 130* 128*  K 4.5 4.2 4.9 4.9  CL 86* 89* 90* 88*  CO2 26 29 29 30   GLUCOSE 211* 97 102* 139*  BUN 40* 35* 35* 40*  CREATININE 1.42* 1.32* 1.24* 1.28*  CALCIUM 9.0 8.8* 8.8* 8.7*  PROT 6.6 6.0* 6.0*  --   ALBUMIN 3.5 3.3* 3.1*  --   AST 33 25 23  --   ALT 24 22 22   --   ALKPHOS 118 105 105  --   BILITOT 1.4* 1.4* 1.1  --   GFRNONAA 31* 34* 37* 36*  GFRAA 36* 40* 43* 41*  ANIONGAP 13 11 11 10      Hematology Recent Labs  Lab 04/11/17 1221 04/12/17 0645  WBC 8.0 7.5  RBC 3.83* 3.73*  HGB 13.1 12.7  HCT 39.7 38.8  MCV 103.7* 104.0*  MCH 34.2* 34.0  MCHC 33.0 32.7  RDW 17.8* 17.7*  PLT 244 191    Cardiac Enzymes Recent Labs  Lab 04/11/17  1221  TROPONINI 0.03*   No results for input(s): TROPIPOC in the last 168 hours.   BNP Recent Labs  Lab 04/11/17 1221  BNP 2,655.0*     Radiology    No results found.  Cardiac Studies   Echocardiogram: 04/12/2017 Study Conclusions  - Left ventricle: The cavity size was mildly dilated. Systolic   function was severely reduced. The estimated ejection fraction   was in the range of 25% to 30%. Severe diffuse hypokinesis with   distinct regional wall motion abnormalities. There is akinesis of   the entireinferior myocardium. There is akinesis of the   apicalanterior and apical myocardium. There was a reduced   contribution of atrial contraction to ventricular filling, due to   increased ventricular diastolic pressure or atrial contractile   dysfunction. Doppler parameters are consistent with a reversible   restrictive pattern, indicative of decreased left ventricular   diastolic compliance and/or increased  left atrial pressure (grade   3 diastolic dysfunction). Doppler parameters are consistent with   high ventricular filling pressure. - Mitral valve: Calcified annulus. Mildly thickened leaflets .   There was mild regurgitation. - Left atrium: The atrium was mildly dilated. - Pulmonary arteries: PA peak pressure: 43 mm Hg (S). - Pericardium, extracardiac: A small, free-flowing pericardial   effusion was identified along the right ventricular free wall and   along the right atrial free wall. The fluid had no internal   echoes.There was no evidence of hemodynamic compromise. There was   a left pleural effusion.  Impressions:  - The right ventricular systolic pressure was increased consistent   with moderate pulmonary hypertension.  Patient Profile     84 y.o. female w/ PMH of high-grade AV block (s/p Medtronic PPM placement in 2013 with Bi-V upgrade and CRT), nonischemic cardiomyopathy (EF 40-45% by echo in 2017), PAF (on Eliquis and Amiodarone), HTN, and HLD who presented to [facility] on 3/30 for worsening dyspnea and lower extremity edema. Cardiology consulted to assist with management of CHF as repeat echo showed EF is now reduced to 25-30%.   Assessment & Plan    1. Acute on Chronic Combined Systolic and Diastolic CHF - presented with worsening dyspnea on exertion, orthopnea, and edema over the past 3 weeks with an associated weight gain of over 10 pounds. BNP elevated to 2655 on admission with CXR showing new bilateral pleural effusions and pulmonary vascular congestion. Repeat echo shows EF is further reduced to 25-30% with wall motion abnormalities as outlined above. - currently on IV Lasix 40mg  BID. Overall, volume status appears improved by examination but she is still experiencing dyspnea on exertion and requiring oxygen supplementation. Would continue with IV diuresis at this time. Will order a repeat BNP and BMET for this morning. I's and O's inaccurate as she has experienced  multiple episodes of incontinence and weights have been variable. Will ask for a standing weight to be obtained this morning.  - continue Toprol-XL and Losartan at current dosing.   2. High-Grade AV Block - s/p Medtronic PPM placement in 2013 with Bi-V upgrade and CRT. - AV paced on telemetry with HR in the 70's. No   3. PAF - s/p DCCV in 08/2016. Remains on low-dose Amiodarone 50mg  daily.  - She denies any evidence of active bleeding. Continue Eliquis for anticoagulation.  4. HTN - BP has been well controlled at 111/65 - 126/73 within the past 24 hours. - continue Losartan 12.5mg  daily and Toprol-XL 75mg  daily.    For questions  or updates, please contact [facility] Please consult [email or web address] for contact info under Cardiology/STEMI.   Lorri Frederick , PA-C 8:28 AM 04/15/2017 Pager: [phone]   Attending note:  Patient seen and examined.  Reviewed interval hospital course and discussed the case with Ms. Iran Ouch PA-C.  Patient remains on IV Lasix, urine output not accurately recorded.  She has shown some clinical improvement but not at baseline.  Remains with AV paced rhythm by telemetry which I personally reviewed.  Blood pressure is also been stable with systolics in the 110-130 range.  Follow-up lab work shows stable renal dysfunction with creatinine 1.28, potassium 4.9.  She remains mildly hyponatremic.  For now would continue on IV diuretic for management of acute on chronic combined heart failure.  Jonelle Sidle, M.D., F.A.C.C.

## 2017-04-15 NOTE — Care Management Important Message (Signed)
Important Message  Patient Details  Name: Kathleen [name] MRN: [id number] Date of Birth: 03-Oct-1924   Medicare Important Message Given:  Yes    Renie Ora 04/15/2017, 11:28 AM

## 2017-04-16 LAB — BASIC METABOLIC PANEL
ANION GAP: 10 (ref 5–15)
BUN: 46 mg/dL — ABNORMAL HIGH (ref 6–20)
CALCIUM: 8.6 mg/dL — AB (ref 8.9–10.3)
CHLORIDE: 87 mmol/L — AB (ref 101–111)
CO2: 32 mmol/L (ref 22–32)
CREATININE: 1.11 mg/dL — AB (ref 0.44–1.00)
GFR calc Af Amer: 49 mL/min — ABNORMAL LOW (ref >60)
GFR calc non Af Amer: 42 mL/min — ABNORMAL LOW (ref >60)
GLUCOSE: 159 mg/dL — AB (ref 65–99)
Potassium: 4.5 mmol/L (ref 3.5–5.1)
Sodium: 129 mmol/L — ABNORMAL LOW (ref 135–145)

## 2017-04-16 MED ORDER — AMIODARONE HCL 200 MG PO TABS
200.0000 mg | ORAL_TABLET | Freq: Every day | ORAL | Status: DC
  Administered 2017-04-16 – 2017-04-17 (×2): 200 mg via ORAL
  Filled 2017-04-16 (×2): qty 1

## 2017-04-16 NOTE — Progress Notes (Signed)
PROGRESS NOTE    Kathleen [name]  UKG:[id number] DOB: 06-16-1924 DOA: 04/11/2017 PCP: Kathleen Nevins, MD     Brief Narrative:  84 year old woman admitted from home on 3/30 due to shortness of breath and increasing ankle edema.  She has a history of congestive heart failure with a known ejection fraction of 40-45%, complete heart block status post permanent pacemaker implantation, atrial fibrillation maintained on chronic anticoagulation with apixaban, stage III chronic kidney disease.  Per patient and daughter she has been compliant with her diet, fluid restriction and medications.  Cardiology is following.   Assessment & Plan:   Active Problems:   CKD (chronic kidney disease) stage 3, GFR 30-59 ml/min (HCC)   Acute on chronic combined systolic and diastolic CHF, NYHA class 1 (HCC)   CHF (congestive heart failure) (HCC)   Atrial fibrillation, chronic (HCC)   Failure to thrive in adult   Acute hypoxemic respiratory failure -Suspect due to acute systolic heart failure exacerbation -Stable on 2 L of oxygen. -Will need to assess oxygen necessity prior to discharge.  Acute on chronic systolic heart failure -Echocardiogram on 3/31 shows decline in ejection fraction from 40% to 25-30% with akinesis of the inferior myocardium. -Plan to continue IV diuresis for now with Lasix 40 mg twice daily. -Patient still shows signs of volume overload on exam with ankle edema and crackles on lung auscultation, although improved from yesterday -Continue metoprolol. -Continue low-dose Entresto -Intake and output is not being adequately documented.  Complete heart block -Status post pacemaker.  Paroxysmal atrial fibrillation -Remains on low-dose amiodarone. -Continue Eliquis for anticoagulation.  Hypertension -Well-controlled  Stage III chronic kidney disease -Baseline creatinine between 1.2 and 1.4 -Monitor with diuresis and addition of Entresto.  Failure to thrive/protein caloric  malnutrition -Request dietitian evaluation  Hyperlipidemia -Continue statin   DVT prophylaxis: Eliquis Code Status: DNR Family Communication: Daughter at bedside updated on plan of care and all questions answered Disposition Plan: Home with home health pending improvement in heart failure, anticipate 24-48 hours  Consultants:   Cardiology  Procedures:   2D echo as above  Antimicrobials:  Anti-infectives (From admission, onward)   None       Subjective: She is feeling much improved, less short of breath.  Objective: Vitals:   04/16/17 0607 04/16/17 0700 04/16/17 1143 04/16/17 1359  BP: 130/75   117/65  Pulse: 72   71  Resp: 13   18  Temp: 98 F (36.7 C)   98 F (36.7 C)  TempSrc: Oral   Oral  SpO2: 100%  96% 97%  Weight:  60.1 kg (132 lb 7.9 oz)    Height:        Intake/Output Summary (Last 24 hours) at 04/16/2017 1642 Last data filed at 04/16/2017 1200 Gross per 24 hour  Intake 720 ml  Output 400 ml  Net 320 ml   Filed Weights   04/15/17 0511 04/15/17 0908 04/16/17 0700  Weight: 61.5 kg (135 lb 9.3 oz) 60.4 kg (133 lb 1.6 oz) 60.1 kg (132 lb 7.9 oz)    Examination:  General exam: Alert, awake, oriented x 3 Respiratory system: Clear to auscultation. Respiratory effort normal. Cardiovascular system:RRR. No murmurs, rubs, gallops. Gastrointestinal system: Abdomen is nondistended, soft and nontender. No organomegaly or masses felt. Normal bowel sounds heard. Central nervous system: Alert and oriented. No focal neurological deficits. Extremities: No C/C/E, +pedal pulses Skin: No rashes, lesions or ulcers Psychiatry: Judgement and insight appear normal. Mood & affect appropriate.  Data Reviewed: I have personally reviewed following labs and imaging studies  CBC: Recent Labs  Lab 04/11/17 1221 04/12/17 0645  WBC 8.0 7.5  NEUTROABS 6.9  --   HGB 13.1 12.7  HCT 39.7 38.8  MCV 103.7* 104.0*  PLT 244 191   Basic Metabolic Panel: Recent Labs    Lab 04/12/17 0645 04/13/17 0608 04/14/17 0558 04/15/17 0944 04/16/17 0455  NA 129* 130* 128* 128* 129*  K 4.2 4.9 4.9 4.6 4.5  CL 89* 90* 88* 85* 87*  CO2 29 29 30 30  32  GLUCOSE 97 102* 139* 185* 159*  BUN 35* 35* 40* 41* 46*  CREATININE 1.32* 1.24* 1.28* 1.39* 1.11*  CALCIUM 8.8* 8.8* 8.7* 9.1 8.6*   GFR: Estimated Creatinine Clearance: 32 mL/min (A) (by C-G formula based on SCr of 1.11 mg/dL (H)). Liver Function Tests: Recent Labs  Lab 04/11/17 1221 04/12/17 0645 04/13/17 0608  AST 33 25 23  ALT 24 22 22   ALKPHOS 118 105 105  BILITOT 1.4* 1.4* 1.1  PROT 6.6 6.0* 6.0*  ALBUMIN 3.5 3.3* 3.1*   No results for input(s): LIPASE, AMYLASE in the last 168 hours. No results for input(s): AMMONIA in the last 168 hours. Coagulation Profile: No results for input(s): INR, PROTIME in the last 168 hours. Cardiac Enzymes: Recent Labs  Lab 04/11/17 1221  TROPONINI 0.03*   BNP (last 3 results) No results for input(s): PROBNP in the last 8760 hours. HbA1C: No results for input(s): HGBA1C in the last 72 hours. CBG: Recent Labs  Lab 04/11/17 1645 04/12/17 0721  GLUCAP 144* 87   Lipid Profile: No results for input(s): CHOL, HDL, LDLCALC, TRIG, CHOLHDL, LDLDIRECT in the last 72 hours. Thyroid Function Tests: No results for input(s): TSH, T4TOTAL, FREET4, T3FREE, THYROIDAB in the last 72 hours. Anemia Panel: No results for input(s): VITAMINB12, FOLATE, FERRITIN, TIBC, IRON, RETICCTPCT in the last 72 hours. Urine analysis:    Component Value Date/Time   COLORURINE YELLOW 03/24/2017 1452   APPEARANCEUR CLEAR 03/24/2017 1452   LABSPEC 1.018 03/24/2017 1452   PHURINE 5.0 03/24/2017 1452   GLUCOSEU NEGATIVE 03/24/2017 1452   HGBUR NEGATIVE 03/24/2017 1452   BILIRUBINUR NEGATIVE 03/24/2017 1452   KETONESUR NEGATIVE 03/24/2017 1452   PROTEINUR 100 (A) 03/24/2017 1452   UROBILINOGEN 0.2 10/16/2013 0910   NITRITE NEGATIVE 03/24/2017 1452   LEUKOCYTESUR TRACE (A) 03/24/2017  1452   Sepsis Labs: @LABRCNTIP (procalcitonin:4,lacticidven:4)  )No results found for this or any previous visit (from the past 240 hour(s)).       Radiology Studies: No results found.      Scheduled Meds: . amiodarone  200 mg Oral Daily  . apixaban  2.5 mg Oral BID  . feeding supplement (ENSURE ENLIVE)  237 mL Oral TID BM  . ferrous sulfate  325 mg Oral Q breakfast  . furosemide  40 mg Intravenous BID  . lubiprostone  24 mcg Oral BID WC  . metoprolol succinate  75 mg Oral Daily  . pravastatin  40 mg Oral q1800  . sacubitril-valsartan  1 tablet Oral BID   Continuous Infusions:   LOS: 4 days    Time spent: 25 minutes. Greater than 50% of this time was spent in direct contact with the patient coordinating care.     Chaya Jan, MD [facility] Pager [phone]  If 7PM-7AM, please contact night-coverage [email or web address] Password [facility] 04/16/2017, 4:42 PM

## 2017-04-16 NOTE — Progress Notes (Signed)
Physical Therapy Treatment Patient Details Name: CONLEY PAWLING MRN: [id number] DOB: 05-30-1924 Today's Date: 04/16/2017    History of Present Illness Kathleen [name] is a 84yo white female who comes to [facility] on 3/30 c dry cough, SOB, and ankle swelling, noted to have CHF. PMH: combined CHF, AF on eliquis, amiodarone, metoprolol, DM, HTN, s/p PPM, Rigth hip ORIF 2018, TKA >4YA. At baseline pt lives with daughter, perfoms mostly household distance aMB with RW to [phone]. No recent falls in 3 months.     PT Comments    Patient presents up in chair, agreeable for therapy and her family member present in room.  Patient put on room air and completed BLE ROM exercises with frequent rest breaks due to SOB and c/o back discomfort, O2 saturation at 93% prior to gait training, demonstrates fair/good return for using Rollator demonstrating slow slightly labored cadence without loss of balance, limited secondary to c/o fatigue and O2 saturations dropped to 85% on room air before ambulating back to room - RN notified.  Patient continued sitting up in chair with family member present.  Patient will benefit from continued physical therapy in hospital and recommended venue below to increase strength, balance, endurance for safe ADLs and gait.   Follow Up Recommendations  Home health PT;Supervision for mobility/OOB     Equipment Recommendations  Other (comment)(4 wheeled walker with brakes and seat)    Recommendations for Other Services       Precautions / Restrictions Precautions Precautions: Fall Restrictions Weight Bearing Restrictions: No    Mobility  Bed Mobility               General bed mobility comments: Patient presents seated in chair (assisted by nursing staff)  Transfers Overall transfer level: Needs assistance Equipment used: 4-wheeled walker Transfers: Sit to/from Stand;Stand Pivot Transfers Sit to Stand: Supervision Stand pivot transfers: Supervision           Ambulation/Gait Ambulation/Gait assistance: Min guard Ambulation Distance (Feet): 65 Feet Assistive device: 4-wheeled walker Gait Pattern/deviations: Decreased step length - right;Decreased step length - left;Decreased stride length Gait velocity: decreased Gait velocity interpretation: Below normal speed for age/gender General Gait Details: demonstrates slow labored cadence without loss of balance, good return for using her rollator, limited secondary to c/o fatigue on room air with O2 saturation dropping from 93% to 85%, required 2 LPM O2 to return to [facility]   Stairs            Wheelchair Mobility    Modified Rankin (Stroke Patients Only)       Balance Overall balance assessment: Needs assistance Sitting-balance support: Feet supported;No upper extremity supported Sitting balance-Leahy Scale: Good     Standing balance support: Bilateral upper extremity supported;During functional activity Standing balance-Leahy Scale: Fair Standing balance comment: using Rollator                            Cognition Arousal/Alertness: Awake/alert Behavior During Therapy: WFL for tasks assessed/performed Overall Cognitive Status: Within Functional Limits for tasks assessed                                        Exercises General Exercises - Lower Extremity Long Arc Quad: Seated;AROM;Strengthening;Both;10 reps Hip Flexion/Marching: Seated;AROM;Strengthening;Both;10 reps Toe Raises: Seated;AROM;Strengthening;Both;10 reps Heel Raises: Seated;AROM;Strengthening;Both;10 reps    General Comments        Pertinent  Vitals/Pain Pain Assessment: Faces Faces Pain Scale: Hurts a little bit Pain Location: low back Pain Descriptors / Indicators: Aching Pain Intervention(s): Limited activity within patient's tolerance;Monitored during session    Home Living                      Prior Function            PT Goals (current goals can now be found  in the care plan section) Acute Rehab PT Goals Patient Stated Goal: to get home  PT Goal Formulation: With patient Time For Goal Achievement: 04/27/17 Potential to Achieve Goals: Good Progress towards PT goals: Progressing toward goals    Frequency    Min 3X/week      PT Plan Current plan remains appropriate    Co-evaluation              AM-PAC PT "6 Clicks" Daily Activity  Outcome Measure  Difficulty turning over in bed (including adjusting bedclothes, sheets and blankets)?: A Little Difficulty moving from lying on back to sitting on the side of the bed? : A Little Difficulty sitting down on and standing up from a chair with arms (e.g., wheelchair, bedside commode, etc,.)?: A Little Help needed moving to and from a bed to chair (including a wheelchair)?: A Little Help needed walking in hospital room?: A Little Help needed climbing 3-5 steps with a railing? : A Lot 6 Click Score: 17    End of Session Equipment Utilized During Treatment: Gait belt Activity Tolerance: Patient tolerated treatment well;Patient limited by fatigue Patient left: in chair;with call bell/phone within reach;with family/visitor present Nurse Communication: Mobility status PT Visit Diagnosis: Unsteadiness on feet (R26.81);Difficulty in walking, not elsewhere classified (R26.2);Muscle weakness (generalized) (M62.81);Other abnormalities of gait and mobility (R26.89)     Time: 6825-7493 PT Time Calculation (min) (ACUTE ONLY): 31 min  Charges:  $Gait Training: 8-22 mins $Therapeutic Exercise: 8-22 mins                    G Codes:       11:27 AM, 04/17/17 Ocie Bob, MPT Physical Therapist with [facility] [phone] office [phone] mobile phone

## 2017-04-16 NOTE — Plan of Care (Signed)
Pt had bowel movement.

## 2017-04-16 NOTE — Progress Notes (Signed)
Noted repeat Consult for diet education.   Patient seen for assessment and diet education on 4/2. Patients reported diet was assessed at that time and it was felt the daughter was already optimally managing the patients sodium intake; total sodium intake felt to be appropriate. However, please note, one reason that patient's sodium intake is within recommended HF guidelines is because the patient consumes minimal amounts of food in general.   Will d/c consult since education already conducted.   Christophe Louis RD, LDN, CNSC Clinical Nutrition Available Tues-Sat via Pager: [phone] 04/16/2017 4:50 PM

## 2017-04-16 NOTE — Care Management Note (Signed)
Case Management Note  Patient Details  Name: Kathleen [name] MRN: [id number] Date of Birth: Nov 02, 1924  If discussed at Long Length of Stay Meetings, dates discussed:  04/16/17  Additional Comments: Pt needs rollator and home O2. Family would like to use [facility] for DME. CM will notify [facility] rep, Linda. DME will not be delivered until tomorrow. DC not planned for another 24-48 hrs. Pt may need another home O2 assessment tomorrow if not DC.   Malcolm Metro, RN 04/16/2017, 12:09 PM

## 2017-04-16 NOTE — Progress Notes (Addendum)
Progress Note  Patient Name: Kathleen [name] Date of Encounter: 04/16/2017  Primary Cardiologist: Thurmon Fair, MD   Subjective   Denies any chest pain or palpitations. Breathing improved but still having dyspnea with ambulation.   Inpatient Medications    Scheduled Meds: . amiodarone  50 mg Oral Daily  . apixaban  2.5 mg Oral BID  . feeding supplement (ENSURE ENLIVE)  237 mL Oral TID BM  . ferrous sulfate  325 mg Oral Q breakfast  . furosemide  40 mg Intravenous BID  . lubiprostone  24 mcg Oral BID WC  . metoprolol succinate  75 mg Oral Daily  . pravastatin  40 mg Oral q1800  . sacubitril-valsartan  1 tablet Oral BID   Continuous Infusions:  PRN Meds: acetaminophen, nitroGLYCERIN, ondansetron, polyethylene glycol, traMADol   Vital Signs    Vitals:   04/15/17 1503 04/15/17 2225 04/16/17 0607 04/16/17 0700  BP: 119/79 125/75 130/75   Pulse: 70 67 72   Resp: 16 17 13    Temp: 97.7 F (36.5 C) 97.6 F (36.4 C) 98 F (36.7 C)   TempSrc: Oral Oral Oral   SpO2: 100% 98% 100%   Weight:    132 lb 7.9 oz (60.1 kg)  Height:        Intake/Output Summary (Last 24 hours) at 04/16/2017 0848 Last data filed at 04/16/2017 0600 Gross per 24 hour  Intake 1200 ml  Output 600 ml  Net 600 ml   Filed Weights   04/15/17 0511 04/15/17 0908 04/16/17 0700  Weight: 135 lb 9.3 oz (61.5 kg) 133 lb 1.6 oz (60.4 kg) 132 lb 7.9 oz (60.1 kg)    Telemetry    AV paced, HR in 70's. No ectopic events. - Personally Reviewed  ECG   No new tracings.   Physical Exam   General: Well developed, elderly Caucasian female appearing in no acute distress. Head: Normocephalic, atraumatic.  Neck: Supple without bruits, JVD not elevated. Lungs:  Resp regular and unlabored, mild rales along bases bilaterally Heart: RRR, S1, S2, no S3, S4, or murmur; no rub. Abdomen: Soft, non-tender, non-distended with normoactive bowel sounds. No hepatomegaly. No rebound/guarding. No obvious abdominal  masses. Extremities: No clubbing or cyanosis, trace edema. Distal pedal pulses are 2+ bilaterally. Neuro: Alert and oriented X 3. Moves all extremities spontaneously. Psych: Normal affect.  Labs    Chemistry Recent Labs  Lab 04/11/17 1221 04/12/17 0645 04/13/17 1610 04/14/17 0558 04/15/17 0944 04/16/17 0455  NA 125* 129* 130* 128* 128* 129*  K 4.5 4.2 4.9 4.9 4.6 4.5  CL 86* 89* 90* 88* 85* 87*  CO2 26 29 29 30 30  32  GLUCOSE 211* 97 102* 139* 185* 159*  BUN 40* 35* 35* 40* 41* 46*  CREATININE 1.42* 1.32* 1.24* 1.28* 1.39* 1.11*  CALCIUM 9.0 8.8* 8.8* 8.7* 9.1 8.6*  PROT 6.6 6.0* 6.0*  --   --   --   ALBUMIN 3.5 3.3* 3.1*  --   --   --   AST 33 25 23  --   --   --   ALT 24 22 22   --   --   --   ALKPHOS 118 105 105  --   --   --   BILITOT 1.4* 1.4* 1.1  --   --   --   GFRNONAA 31* 34* 37* 36* 32* 42*  GFRAA 36* 40* 43* 41* 37* 49*  ANIONGAP 13 11 11 10 13  10  Hematology Recent Labs  Lab 04/11/17 1221 04/12/17 0645  WBC 8.0 7.5  RBC 3.83* 3.73*  HGB 13.1 12.7  HCT 39.7 38.8  MCV 103.7* 104.0*  MCH 34.2* 34.0  MCHC 33.0 32.7  RDW 17.8* 17.7*  PLT 244 191    Cardiac Enzymes Recent Labs  Lab 04/11/17 1221  TROPONINI 0.03*   No results for input(s): TROPIPOC in the last 168 hours.   BNP Recent Labs  Lab 04/11/17 1221 04/15/17 0944  BNP 2,655.0* 2,186.0*     DDimer No results for input(s): DDIMER in the last 168 hours.   Radiology    No results found.  Cardiac Studies   Echocardiogram: 04/12/2017 Study Conclusions  - Left ventricle: The cavity size was mildly dilated. Systolic   function was severely reduced. The estimated ejection fraction   was in the range of 25% to 30%. Severe diffuse hypokinesis with   distinct regional wall motion abnormalities. There is akinesis of   the entireinferior myocardium. There is akinesis of the   apicalanterior and apical myocardium. There was a reduced   contribution of atrial contraction to  ventricular filling, due to   increased ventricular diastolic pressure or atrial contractile   dysfunction. Doppler parameters are consistent with a reversible   restrictive pattern, indicative of decreased left ventricular   diastolic compliance and/or increased left atrial pressure (grade   3 diastolic dysfunction). Doppler parameters are consistent with   high ventricular filling pressure. - Mitral valve: Calcified annulus. Mildly thickened leaflets .   There was mild regurgitation. - Left atrium: The atrium was mildly dilated. - Pulmonary arteries: PA peak pressure: 43 mm Hg (S). - Pericardium, extracardiac: A small, free-flowing pericardial   effusion was identified along the right ventricular free wall and   along the right atrial free wall. The fluid had no internal   echoes.There was no evidence of hemodynamic compromise. There was   a left pleural effusion.  Impressions:  - The right ventricular systolic pressure was increased consistent   with moderate pulmonary hypertension.   Patient Profile     84 y.o. female w/ PMH of high-grade AV block (s/p Medtronic PPM placement in 2013 with Bi-V upgrade and CRT), nonischemic cardiomyopathy (EF 40-45% by echo in 2017), PAF (on Eliquis and Amiodarone), HTN, and HLD who presented to [facility] on 3/30 for worsening dyspnea and lower extremity edema. Cardiology consulted to assist with management of CHF as repeat echo showed EF is now reduced to 25-30%.  Assessment & Plan    1. Acute on Chronic Combined Systolic and Diastolic CHF - presented with worsening dyspnea on exertion, orthopnea, and edema over the past 3 weeks with an associated weight gain of over 10 pounds. BNP elevated to 2655on admission withCXR showingnew bilateral pleural effusions and pulmonary vascular congestion. Repeat echo shows EF is further reduced to 25-30% with wall motion abnormalities as outlined above. - currently on IV Lasix 40mg  BID with minimal  recorded output but has experienced over 5 episodes of urinary incontinence and weight stable at 132 lbs. Anticipate switching back to PO Lasix tomorrow (was on 40 mg daily PTA --> may need to increase to 60mg  daily). Sherryll Burger has been initiated by the admitting team. Scheduled to receive first doses today. Would monitor BP closely as this has been soft at times during admission. Continue Toprol-XL 75mg  daily. Check ambulatory oxygen saturations today.    2. High-Grade AV Block -s/p Medtronic PPM placement in 2013 with Bi-V upgrade and CRT. -  AV paced on telemetry with no ectopic events noted.   3. PAF - s/p DCCV in 08/2016. Remains on Amiodarone 200mg  daily and Toprol-XL 75mg  daily. Amiodarone initially ordered as 50mg  daily but verified with patient's daughter and by review of prior progress notes that she is taking 200mg  daily. Updated current medication order. -She denies any evidence of active bleeding. ContinueEliquis for anticoagulation.  4. HTN - BP has been well controlled at 119/75 - 130/79 within the past 24 hours. - continue current medication regimen with Toprol-XL and Entresto 24-26mg  BID (scheduled to receive first dose this morning - monitor BP with this addition).   5. Hyponatremia - Na+ variable at 128 - 130 since admission, stable at 129 this morning.   For questions or updates, please contact [facility] Please consult [email or web address] for contact info under Cardiology/STEMI.   Lorri Frederick , PA-C 8:48 AM 04/16/2017 Pager: [phone]   Attending note:  Patient seen and examined.  Discussed the case with Ms. Iran Ouch PA-C.  Ms. Scarlata has been slowly improving in terms of shortness of breath, but remains with an oxygen requirement.  Recorded urine output has been incomplete.  She was switched by primary team from [facility] to [facility], would follow blood pressure closely.  Lungs exhibit decreased breath sounds at the bases with scattered crackles,  no wheezing.  Otherwise remains on Toprol-XL.  Anticipate converting from IV to oral Lasix tomorrow.  She otherwise remains with an AV paced rhythm on amiodarone and Eliquis.  Follow-up creatinine 1.39.  Jonelle Sidle, M.D., F.A.C.C.

## 2017-04-17 LAB — BASIC METABOLIC PANEL
Anion gap: 12 (ref 5–15)
BUN: 41 mg/dL — AB (ref 6–20)
CALCIUM: 8.7 mg/dL — AB (ref 8.9–10.3)
CO2: 34 mmol/L — AB (ref 22–32)
CREATININE: 1 mg/dL (ref 0.44–1.00)
Chloride: 86 mmol/L — ABNORMAL LOW (ref 101–111)
GFR calc Af Amer: 56 mL/min — ABNORMAL LOW (ref >60)
GFR, EST NON AFRICAN AMERICAN: 48 mL/min — AB (ref >60)
GLUCOSE: 147 mg/dL — AB (ref 65–99)
Potassium: 4.4 mmol/L (ref 3.5–5.1)
Sodium: 132 mmol/L — ABNORMAL LOW (ref 135–145)

## 2017-04-17 MED ORDER — AMIODARONE HCL 200 MG PO TABS: 200.0000 mg | ORAL_TABLET | Freq: Every day | ORAL | 2 refills | Status: AC

## 2017-04-17 MED ORDER — FUROSEMIDE 20 MG PO TABS: 60.0000 mg | ORAL_TABLET | Freq: Every day | ORAL | 2 refills | Status: AC

## 2017-04-17 MED ORDER — SACUBITRIL-VALSARTAN 24-26 MG PO TABS: 1.0000 | ORAL_TABLET | Freq: Two times a day (BID) | ORAL | 2 refills | Status: DC

## 2017-04-17 MED ORDER — FUROSEMIDE 20 MG PO TABS
60.0000 mg | ORAL_TABLET | Freq: Every day | ORAL | Status: DC
  Administered 2017-04-17: 10:00:00 60 mg via ORAL
  Filled 2017-04-17: qty 1

## 2017-04-17 NOTE — Care Management Important Message (Signed)
Important Message  Patient Details  Name: Kathleen [name] MRN: [id number] Date of Birth: [birth year]   Medicare Important Message Given:  Yes    Renie Ora 04/17/2017, 10:15 AM

## 2017-04-17 NOTE — Progress Notes (Signed)
Oxygen Therapy 86% on Room air   Treatment Indications: home O2 assessment

## 2017-04-17 NOTE — Progress Notes (Signed)
Progress Note  Patient Name: Kathleen [name] Date of Encounter: 04/17/2017  Primary Cardiologist: Dr. Rachelle Hora Croitoru  Subjective   States that she slept well.  No breathlessness or cough at rest.  No chest pain.  Has not moved around much this morning as yet.  Inpatient Medications    Scheduled Meds: . amiodarone  200 mg Oral Daily  . apixaban  2.5 mg Oral BID  . feeding supplement (ENSURE ENLIVE)  237 mL Oral TID BM  . ferrous sulfate  325 mg Oral Q breakfast  . furosemide  60 mg Oral Daily  . lubiprostone  24 mcg Oral BID WC  . metoprolol succinate  75 mg Oral Daily  . pravastatin  40 mg Oral q1800  . sacubitril-valsartan  1 tablet Oral BID    PRN Meds: acetaminophen, nitroGLYCERIN, ondansetron, polyethylene glycol, traMADol   Vital Signs    Vitals:   04/16/17 1359 04/16/17 1951 04/16/17 2238 04/17/17 0625  BP: 117/65  111/65 127/69  Pulse: 71  71 72  Resp: 18  16 13   Temp: 98 F (36.7 C)  97.7 F (36.5 C) 97.8 F (36.6 C)  TempSrc: Oral  Oral Oral  SpO2: 97% 95% 96% 97%  Weight:      Height:        Intake/Output Summary (Last 24 hours) at 04/17/2017 0909 Last data filed at 04/17/2017 0500 Gross per 24 hour  Intake 480 ml  Output 1100 ml  Net -620 ml   Filed Weights   04/15/17 0511 04/15/17 0908 04/16/17 0700  Weight: 135 lb 9.3 oz (61.5 kg) 133 lb 1.6 oz (60.4 kg) 132 lb 7.9 oz (60.1 kg)    Telemetry    Dual-chamber paced rhythm.  Personally reviewed.  Physical Exam   GEN:  Elderly woman.  No acute distress.   Neck: No JVD. Cardiac: RRR, no murmur, rub, or gallop.  Respiratory:  Anterior crackles and rhonchi.  Nonlabored. GI: Soft, nontender, bowel sounds present. MS: No edema; No deformity. Neuro:  Nonfocal. Psych: Alert and oriented x 3. Normal affect.  Labs    Chemistry Recent Labs  Lab 04/11/17 1221 04/12/17 0645 04/13/17 6945  04/15/17 0944 04/16/17 0455 04/17/17 0614  NA 125* 129* 130*   < > 128* 129* 132*  K 4.5 4.2 4.9   <  > 4.6 4.5 4.4  CL 86* 89* 90*   < > 85* 87* 86*  CO2 26 29 29    < > 30 32 34*  GLUCOSE 211* 97 102*   < > 185* 159* 147*  BUN 40* 35* 35*   < > 41* 46* 41*  CREATININE 1.42* 1.32* 1.24*   < > 1.39* 1.11* 1.00  CALCIUM 9.0 8.8* 8.8*   < > 9.1 8.6* 8.7*  PROT 6.6 6.0* 6.0*  --   --   --   --   ALBUMIN 3.5 3.3* 3.1*  --   --   --   --   AST 33 25 23  --   --   --   --   ALT 24 22 22   --   --   --   --   ALKPHOS 118 105 105  --   --   --   --   BILITOT 1.4* 1.4* 1.1  --   --   --   --   GFRNONAA 31* 34* 37*   < > 32* 42* 48*  GFRAA 36* 40* 43*   < > 37*  49* 56*  ANIONGAP 13 11 11    < > 13 10 12    < > = values in this interval not displayed.     Hematology Recent Labs  Lab 04/11/17 1221 04/12/17 0645  WBC 8.0 7.5  RBC 3.83* 3.73*  HGB 13.1 12.7  HCT 39.7 38.8  MCV 103.7* 104.0*  MCH 34.2* 34.0  MCHC 33.0 32.7  RDW 17.8* 17.7*  PLT 244 191    Cardiac Enzymes Recent Labs  Lab 04/11/17 1221  TROPONINI 0.03*   No results for input(s): TROPIPOC in the last 168 hours.   BNP Recent Labs  Lab 04/11/17 1221 04/15/17 0944  BNP 2,655.0* 2,186.0*     Radiology    No results found.  Cardiac Studies   Echocardiogram: 04/12/2017 Study Conclusions  - Left ventricle: The cavity size was mildly dilated. Systolic function was severely reduced. The estimated ejection fraction was in the range of 25% to 30%. Severe diffuse hypokinesis with distinct regional wall motion abnormalities. There is akinesis of the entireinferior myocardium. There is akinesis of the apicalanterior and apical myocardium. There was a reduced contribution of atrial contraction to ventricular filling, due to increased ventricular diastolic pressure or atrial contractile dysfunction. Doppler parameters are consistent with a reversible restrictive pattern, indicative of decreased left ventricular diastolic compliance and/or increased left atrial pressure (grade 3 diastolic  dysfunction). Doppler parameters are consistent with high ventricular filling pressure. - Mitral valve: Calcified annulus. Mildly thickened leaflets . There was mild regurgitation. - Left atrium: The atrium was mildly dilated. - Pulmonary arteries: PA peak pressure: 43 mm Hg (S). - Pericardium, extracardiac: A small, free-flowing pericardial effusion was identified along the right ventricular free wall and along the right atrial free wall. The fluid had no internal echoes.There was no evidence of hemodynamic compromise. There was a left pleural effusion.  Impressions:  - The right ventricular systolic pressure was increased consistent with moderate pulmonary hypertension.  Patient Profile     84 y.o. female with a history of high-grade AV block (s/p Medtronic PPM placement in 2013 with Bi-V upgrade and CRT), nonischemic cardiomyopathy (EF 40-45% by echo in 2017), PAF (on Eliquis and Amiodarone), HTN, and HLD who presented to [facility] on 3/30 for worsening dyspnea and lower extremity edema. Cardiology consulted to assist with management of CHF as repeat echo showed EF is now reduced to 25-30%.  Assessment & Plan    1.  Acute on chronic combined heart failure.  With IV diuresis weight is down approximately 3 pounds from peak.  Urine output has not been accurately recorded.  She has been working with PT, does have plan for home oxygen as well.  2.  Nonischemic cardiomyopathy with LVEF 25-30% range.  She tolerated switch from ARB to [facility] with stable blood pressures.  3.  Essential hypertension, blood pressure well controlled at this time.  4.  Paroxysmal atrial fibrillation.  Continues with dual-chamber pacing by telemetry on low-dose amiodarone.  She is on Eliquis for stroke prophylaxis.  Continue current medical regimen including amiodarone, Toprol-XL, Entresto, Pravachol, and Eliquis.  We will switch from IV back to oral Lasix at 60 mg daily.  When ready for  discharge, she has requested to follow-up in our office in [place]. Please schedule visit with Ms. Iran Ouch PA-C in about 7 days after discharge.  Signed, Nona Dell, MD  04/17/2017, 9:09 AM

## 2017-04-17 NOTE — Progress Notes (Signed)
Thank you for taking care of her and keeping me up to date, [facility]

## 2017-04-17 NOTE — Progress Notes (Signed)
I meant,  Thank you -BRITTANY MCr

## 2017-04-17 NOTE — Discharge Summary (Signed)
Physician Discharge Summary  Kathleen [name] EXB:[id number] DOB: Jun 07, 1924 DOA: 04/11/2017  PCP: Elfredia Nevins, MD  Admit date: 04/11/2017 Discharge date: 04/17/2017  Time spent: 45 minutes  Recommendations for Outpatient Follow-up:  -Will be discharged home today. -Has follow-up scheduled with cardiology for 4/16.  Discharge Diagnoses:  Active Problems:   CKD (chronic kidney disease) stage 3, GFR 30-59 ml/min (HCC)   Acute on chronic combined systolic and diastolic CHF, NYHA class 1 (HCC)   CHF (congestive heart failure) (HCC)   Atrial fibrillation, chronic (HCC)   Failure to thrive in adult   Discharge Condition: Stable and improved  Filed Weights   04/15/17 0908 04/16/17 0700 04/17/17 0900  Weight: 60.4 kg (133 lb 1.6 oz) 60.1 kg (132 lb 7.9 oz) 60.2 kg (132 lb 11.5 oz)    History of present illness:  As per Dr. Roda Shutters on 3/30: Kathleen [name] is a 84 y.o. 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 fib on Eliquis, metoprolol and amiodarone presented to the emergency room due to short of breath, ankle edema, weight gain and dry cough.  She denies of fever no chest pain.  She called cardiology office who instructed her to increase Lasix dose at home.  However she did not improve, she presented to the emergency room.  ED course: Patient is found hypoxia O2 88% on room air, she is started on 2 L of oxygen O2 improved to 99%.  She has paced rhythm, blood pressure stable ,no fever.  No leukocytosis, BMP is significant for sodium 125, elevated BNP at 2600, troponin 0 0.03.  BUN 40 creatinine 1.4, her most recent creatinine 1.2 in a few weeks ago, creatinine 1.3 and 2018).  cxr "New bilateral pleural effusions, left greater in right, and pulmonary vascular congestion compatible with CHF.  Chronic  lung disease."  She is given iv Lasix 40 mg in the ED, hospitalist called to admit the patient.    Hospital Course:   Acute hypoxemic respiratory failure -Suspect due to acute systolic heart failure exacerbation -Oxygen to be arranged for home use.  Acute on chronic systolic heart failure -Echocardiogram on 3/31 shows decline in ejection fraction from 40% to 25-30% with akinesis of the inferior myocardium. -Has been transitioned over to p.o. Lasix.  Volume status has improved. -Continue metoprolol. -Continue low-dose Entresto, started this admission, she has tolerated well. -Intake and output was not adequately documented. -Cardiology has been following this admission.  Complete heart block -Status post pacemaker.  Paroxysmal atrial fibrillation -Remains on low-dose amiodarone. -Continue Eliquis for anticoagulation.  Hypertension -Well-controlled  Stage III chronic kidney disease -Baseline creatinine between 1.2 and 1.4  Failure to thrive/protein caloric malnutrition -Appreciate dietitian input and recommendations.  Hyperlipidemia -Continue statin     Procedures:  2D echo as above  Consultations:  Cardiology  Discharge Instructions  Discharge Instructions    Diet - low sodium heart healthy   Complete by:  As directed    Increase activity slowly   Complete by:  As directed      Allergies as of 04/17/2017      Reactions   Coumadin [warfarin Sodium] Other (See Comments)   Caused Patient to Bleed Out.    Meloxicam Other (See Comments)   Unknown   Metformin And Related Other (See Comments)   Cannot have due to kidney function  Morphine Itching   Not in right state of mind.    Nabumetone Nausea And Vomiting   Oxycodone Nausea And Vomiting   Sulfonamide Derivatives Hives      Medication List    STOP taking these medications   losartan 25 MG tablet Commonly known as:  COZAAR     TAKE these medications   acetaminophen 325 MG  tablet Commonly known as:  TYLENOL Take 650 mg by mouth every 4 (four) hours as needed for mild pain or moderate pain.   amiodarone 200 MG tablet Commonly known as:  PACERONE Take 1 tablet (200 mg total) by mouth daily. Start taking on:  04/18/2017 What changed:    medication strength  how much to take   AMITIZA 24 MCG capsule Generic drug:  lubiprostone TAKE (1) CAPSULE BY MOUTH TWICE DAILY.   CO Q 10 PO Take 1 tablet by mouth daily.   ELIQUIS 2.5 MG Tabs tablet Generic drug:  apixaban TAKE ONE TABLET BY MOUTH TWICE DAILY.   ferrous sulfate 325 (65 FE) MG tablet Take 325 mg by mouth daily with breakfast.   furosemide 20 MG tablet Commonly known as:  LASIX Take 3 tablets (60 mg total) by mouth daily. Start taking on:  04/18/2017 What changed:    medication strength  how much to take  how to take this  when to take this  additional instructions   glimepiride 1 MG tablet Commonly known as:  AMARYL Take 0.5 mg by mouth daily as needed (high blood sugars). Take 0.5 tablet (0.5 mg total) by mouth daily if CBG is >120.   ICAPS AREDS 2 Caps Take 1 capsule by mouth twice daily   metoprolol succinate 25 MG 24 hr tablet Commonly known as:  TOPROL-XL Take 3 tablets (75 mg total) by mouth daily. What changed:  when to take this   nitroGLYCERIN 0.4 MG SL tablet Commonly known as:  NITROSTAT DISSOLVE 1 TABLET UNDER TONGUE EVERY 5 MINUTES UP TO 15 MIN FOR CHESTPAIN. IF NO RELIEF CALL 911.   NYQUIL COLD & FLU PO Take 1 capsule by mouth at bedtime as needed (sleep).   ondansetron 4 MG tablet Commonly known as:  ZOFRAN Take 4 mg by mouth every 4 (four) hours as needed for nausea or vomiting.   potassium chloride SA 20 MEQ tablet Commonly known as:  K-DUR,KLOR-CON Take 1 capsule (20 mEq) daily. Take an extra 1 capsule (20 mEq) on days you take an extra lasix.   pravastatin 40 MG tablet Commonly known as:  PRAVACHOL TAKE 1 TABLET BY MOUTH EACH EVENING.    sacubitril-valsartan 24-26 MG Commonly known as:  ENTRESTO Take 1 tablet by mouth 2 (two) times daily.   traMADol 50 MG tablet Commonly known as:  ULTRAM Take 1 tablet (50 mg total) by mouth every 8 (eight) hours as needed.            Durable Medical Equipment  (From admission, onward)        Start     Ordered   04/16/17 1137  For home use only DME 4 wheeled rolling walker with seat  Once    Question:  Patient needs a walker to treat with the following condition  Answer:  CHF (congestive heart failure) (HCC)   04/16/17 1136   04/16/17 1137  For home use only DME oxygen  Once    Question Answer Comment  Mode or (Route) Nasal cannula   Liters per Minute 2   Frequency Continuous (stationary  and portable oxygen unit needed)   Oxygen delivery system Gas      04/16/17 1137     Allergies  Allergen Reactions  . Coumadin [Warfarin Sodium] Other (See Comments)    Caused Patient to Bleed Out.   . Meloxicam Other (See Comments)    Unknown  . Metformin And Related Other (See Comments)    Cannot have due to kidney function  . Morphine Itching    Not in right state of mind.   . Nabumetone Nausea And Vomiting  . Oxycodone Nausea And Vomiting  . Sulfonamide Derivatives Hives   Follow-up Information    Ellsworth Lennox, PA-C Follow up on 05/01/2017.   Specialties:  Physician Assistant, Cardiology Why:  Cardiology Hospital Follow-Up on 05/01/2017 at 2:30PM. This will be at the [facility]. Contact information: [address] [place] [address] [phone]        [facility].   Specialty:  Home Health Services Contact information: [address] [place] [address] [phone]            The results of significant diagnostics from this hospitalization (including imaging, microbiology, ancillary and laboratory) are listed below for reference.    Significant Diagnostic Studies: Dg Chest 2 View  Result Date:  04/11/2017 CLINICAL DATA:  Dyspnea. EXAM: CHEST - 2 VIEW COMPARISON:  11/08/2016. FINDINGS: Left chest wall pacer device is noted with leads in the right atrial appendage and right ventricle. Cardiac enlargement noted. Aortic atherosclerosis noted. New moderate left pleural effusion and small right pleural effusion. Pulmonary vascular congestion is identified. Chronic opacities within the right upper lung zone appears similar to previous exam. IMPRESSION: New bilateral pleural effusions, left greater in right, and pulmonary vascular congestion compatible with CHF. Chronic lung disease. Electronically Signed   By: Signa Kell M.D.   On: 04/11/2017 13:27    Microbiology: No results found for this or any previous visit (from the past 240 hour(s)).   Labs: Basic Metabolic Panel: Recent Labs  Lab 04/13/17 0608 04/14/17 0558 04/15/17 0944 04/16/17 0455 04/17/17 0614  NA 130* 128* 128* 129* 132*  K 4.9 4.9 4.6 4.5 4.4  CL 90* 88* 85* 87* 86*  CO2 29 30 30  32 34*  GLUCOSE 102* 139* 185* 159* 147*  BUN 35* 40* 41* 46* 41*  CREATININE 1.24* 1.28* 1.39* 1.11* 1.00  CALCIUM 8.8* 8.7* 9.1 8.6* 8.7*   Liver Function Tests: Recent Labs  Lab 04/11/17 1221 04/12/17 0645 04/13/17 0608  AST 33 25 23  ALT 24 22 22   ALKPHOS 118 105 105  BILITOT 1.4* 1.4* 1.1  PROT 6.6 6.0* 6.0*  ALBUMIN 3.5 3.3* 3.1*   No results for input(s): LIPASE, AMYLASE in the last 168 hours. No results for input(s): AMMONIA in the last 168 hours. CBC: Recent Labs  Lab 04/11/17 1221 04/12/17 0645  WBC 8.0 7.5  NEUTROABS 6.9  --   HGB 13.1 12.7  HCT 39.7 38.8  MCV 103.7* 104.0*  PLT 244 191   Cardiac Enzymes: Recent Labs  Lab 04/11/17 1221  TROPONINI 0.03*   BNP: BNP (last 3 results) Recent Labs    08/21/16 1459 04/11/17 1221 04/15/17 0944  BNP 2,323.4* 2,655.0* 2,186.0*    ProBNP (last 3 results) No results for input(s): PROBNP in the last 8760 hours.  CBG: Recent Labs  Lab 04/11/17 1645  04/12/17 0721  GLUCAP 144* 87       Signed:  Estela Philip Aspen  [facility]  Pager: [phone] 04/17/2017, 4:35 PM

## 2017-04-17 NOTE — Progress Notes (Signed)
SATURATION QUALIFICATIONS: (This note is used to comply with regulatory documentation for home oxygen)  Patient Saturations on Room Air at Rest = 86% Patient saturations on 2l/[place] 99%  Patient Saturations on Room Air while Ambulating =  Patient Saturations on  Liters of oxygen while Ambulating =   Please briefly explain why patient needs home oxygen: resting O2 86%

## 2017-04-17 NOTE — Care Management Note (Signed)
Case Management Note  Patient Details  Name: Kathleen [name] MRN: [id number] Date of Birth: [birth year]   Expected Discharge Date:       04/17/2017           Expected Discharge Plan:  Home w Home Health Services  In-House Referral:  Nutrition  Discharge planning Services  CM Consult  Post Acute Care Choice:  Home Health Choice offered to:  Patient  DME Arranged:  Walker rolling with seat, Oxygen DME Agency:  [facility]  HH Arranged:  RN, PT [facility] Agency:  [facility]  Status of Service:  Completed, signed off  If discussed at Long Length of Stay Meetings, dates discussed:    Additional Comments: DC home today with HH, rollator and home O2. Olegario Messier, [facility] rep, will deliver DME to pt room prior to DC. Daughter aware HH has 48 hrs to make first visit. [facility] referral made for Emmi calls.   Malcolm Metro, RN 04/17/2017, 2:40 PM

## 2017-04-20 DIAGNOSIS — I5040 Unspecified combined systolic (congestive) and diastolic (congestive) heart failure: Secondary | ICD-10-CM | POA: Diagnosis not present

## 2017-04-20 DIAGNOSIS — Z96659 Presence of unspecified artificial knee joint: Secondary | ICD-10-CM | POA: Diagnosis not present

## 2017-04-20 DIAGNOSIS — R269 Unspecified abnormalities of gait and mobility: Secondary | ICD-10-CM | POA: Diagnosis not present

## 2017-04-20 DIAGNOSIS — I5042 Chronic combined systolic (congestive) and diastolic (congestive) heart failure: Secondary | ICD-10-CM | POA: Diagnosis not present

## 2017-04-20 DIAGNOSIS — J449 Chronic obstructive pulmonary disease, unspecified: Secondary | ICD-10-CM | POA: Diagnosis not present

## 2017-04-22 ENCOUNTER — Encounter: Payer: PPO | Admitting: Cardiovascular Disease

## 2017-04-23 ENCOUNTER — Ambulatory Visit (INDEPENDENT_AMBULATORY_CARE_PROVIDER_SITE_OTHER): Payer: Self-pay

## 2017-04-23 DIAGNOSIS — I5042 Chronic combined systolic (congestive) and diastolic (congestive) heart failure: Secondary | ICD-10-CM

## 2017-04-23 DIAGNOSIS — Z950 Presence of cardiac pacemaker: Secondary | ICD-10-CM

## 2017-04-24 NOTE — Progress Notes (Signed)
EPIC Encounter for ICM Monitoring  Patient Name: Kathleen [name] is a 84 y.o. female Date: 04/24/2017 Primary Care Physican: Redmond School, MD Primary Cardiologist:Croitoru/Brittany Ahmed Prima, PA Electrophysiologist: Croitoru Dry Weight:127lbs  Bi-V Pacing: 99.9%        Spoke with daughter.  Patient continues to be symptomatic after hospital discharge (hospitalized 04/11/17 to 04/17/2017 for CHF).  She is following daily 1200cc fluid restriction.  Daughter reported they will be following up with Bernerd Pho PA in [place] on 05/01/2017 because it is difficult for patient to get to Dr Croitoru's office.  Symptoms are:  shortness of breath even with O2 but she said the breathing did not improve much during hospitalization  Bilateral leg swelling to midcalf  gained 2 lbs between 4/9 and 4/10 (weight had dropped to 125 lbs).   low urine output    Thoracic impedance continues to be abnormal suggesting fluid accumulation.  Prescribed dosage: Furosemide 20Take 3 tablets (60 mg total) daily. Potassium 20 mEqTake 1 capsule (20 mEq) daily. Take an extra 1 capsule (20 mEq) on days you take an extra lasix.  Labs: 04/17/2017 Creatinine 1.00, BUN 41, Potassium 4.4, Sodium 132, EGFR 48-56 04/16/2017 Creatinine 1.11, BUN 46, Potassium 4.5, Sodium 129, EGFR 42-49  04/15/2017 Creatinine 1.39, BUN 40, Potassium 4.6, Sodium 128, EGFR 32-37  04/14/2017 Creatinine 1.28, BUN 40, Potassium 4.9, Sodium 128, EGFR 36-41  04/13/2017 Creatinine 1.24, BUN 35, Potassium 4.9, Sodium 130, EGFR 38-43  04/12/2017 Creatinine 1.32, BUN 35, Potassium 4.2, Sodium 129, EGFR 34-40  04/11/2017 Creatinine 1.42, BUN 40, Potassium 4.5, Sodium 125, EGFR 31-36  03/24/2017 Creatinine 1.42, BUN 26, Potassium 3.8, Sodium 133, EGFR 31-36  11/08/2016 Creatinine 1.37, BUN 18, Potassium 3.8, Sodium 135, EGFR 33-38 08/22/2016 Creatinine 1.19, BUN 12, Potassium 3.1, Sodium 139, EGFR 39-46 08/21/2016 Creatinine 1.36, BUN  13, Potassium 3.7, Sodium 137, EGFR 33-39 08/13/2016 Creatinine 1.31, BUN 14, Potassium 3.9, Sodium 139, EGFR 36-42 03/12/2016 Creatinine 1.16, BUN 13, Potassium 4.0, Sodium 137 11/27/2015 Creatinine 1.01, BUN 21, Potassium 3.7, Sodium 132, EGFR 48-56 11/19/2015 Creatinine 0.94, BUN 17, Potassium 3.6, Sodium 133, EGFR 53->60  11/18/2015 Creatinine 1.13, BUN 22, Potassium 4.7, Sodium 132, EGFR 42-49  11/14/2015 Creatinine 1.09, BUN 22, Potassium 3.8, Sodium 132, EGFR 44-51  11/12/2015 Creatinine 1.33, BUN 24, Potassium 3.9, Sodium 129, EGFR 35-40  Creatinine has ranged from 1.12 - 1.61 from 04/30/2015 to 11/07/2015  Recommendations:  Copy of ICM check sent to Dr. Sallyanne Kuster and Bernerd Pho, PA.   Follow-up plan: ICM clinic phone appointment on 04/30/2017 to recheck fluid levels before office visit.  Office appointment scheduled 05/01/2017 with Bernerd Pho, [place].  3 month ICM trend: 04/23/2017    1 Year ICM trend:       [name], RN 04/24/2017 8:57 AM

## 2017-04-24 NOTE — Progress Notes (Signed)
Call to daughter.  She stated patient was feeling a little better this afternoon.  Advised I have not received any recommendations at this time but if patient gets worse over the weekend to use ER if needed or call back on Monday.  Next ICM transmission scheduled for 04/30/2017 which is day before office visit with Randall An, NP.

## 2017-04-24 NOTE — Progress Notes (Signed)
In 2017 we did used metolazone occasionally to reach her dry weight. Unfortunately, since then she has lost a lot of real weight, not sure what dry weight is anymore, but suspect around 120 lb. May need metolazone 2.5 mg once or twice a week. MCr

## 2017-04-27 MED ORDER — METOLAZONE 2.5 MG PO TABS: ORAL_TABLET | ORAL | 3 refills | Status: AC

## 2017-04-27 NOTE — Addendum Note (Signed)
Addended by: Karie Soda on: 04/27/2017 11:13 AM   Modules accepted: Orders

## 2017-04-27 NOTE — Progress Notes (Signed)
Call to daughter, [name].  Advised Dr Royann Shivers ordered Metolazone 2.5 mg 1 tablet once to twice a week as needed.  Advised to take 30 minutes prior to Furosemide dose for maximum effect. Advised to follow same instructions she has which is to take extra Potassium tablet when taking extra Furosemide and she can do the same when she takes the [facility].  She they have been elevating patients legs and there is a slight improvement in the swelling. Patient was weak yesterday and her legs gave out.  She did not fall but leaned against door frame and lowered herself to the ground.  Advised the Metolazone can cause weakness as well. She said patient has taken Metolazone in the past.  She has not been able to weigh for a few days due to weakness.  She has an appointment with Randall An, PA on 05/01/2017 and will send remote transmission on 04/30/2017.

## 2017-04-28 ENCOUNTER — Telehealth: Payer: Self-pay | Admitting: Internal Medicine

## 2017-04-29 DIAGNOSIS — I5043 Acute on chronic combined systolic (congestive) and diastolic (congestive) heart failure: Secondary | ICD-10-CM | POA: Diagnosis not present

## 2017-04-29 DIAGNOSIS — Z681 Body mass index (BMI) 19 or less, adult: Secondary | ICD-10-CM | POA: Diagnosis not present

## 2017-04-29 DIAGNOSIS — E1121 Type 2 diabetes mellitus with diabetic nephropathy: Secondary | ICD-10-CM | POA: Diagnosis not present

## 2017-04-29 DIAGNOSIS — R600 Localized edema: Secondary | ICD-10-CM | POA: Diagnosis not present

## 2017-04-29 DIAGNOSIS — R0601 Orthopnea: Secondary | ICD-10-CM | POA: Diagnosis not present

## 2017-04-30 ENCOUNTER — Ambulatory Visit (INDEPENDENT_AMBULATORY_CARE_PROVIDER_SITE_OTHER): Payer: Self-pay

## 2017-04-30 DIAGNOSIS — Z950 Presence of cardiac pacemaker: Secondary | ICD-10-CM

## 2017-04-30 DIAGNOSIS — I5042 Chronic combined systolic (congestive) and diastolic (congestive) heart failure: Secondary | ICD-10-CM

## 2017-04-30 NOTE — Progress Notes (Signed)
EPIC Encounter for ICM Monitoring  Patient Name: Kathleen [name] is a 84 y.o. female Date: 04/30/2017 Primary Care Physican: Redmond School, MD Primary Cardiologist:Croitoru/Brittany Ahmed Prima, PA Electrophysiologist: Croitoru Dry Weight:128.6lbs Bi-V Pacing: 99%        Spoke with daughter.  Pt continues to have leg swelling, shortness of breath, and weight remains elevated. The symptoms have improved slightly in the last couple of days.  PCP check lungs today at office visit and said he could hear congestion.    Thoracic impedance continues to be abnormal suggesting fluid accumulation.     Prescribed dosage: Furosemide 20Take 3 tablets (60 mg total) daily. Potassium 20 mEqTake 1 capsule (20 mEq) daily. Take an extra 1 capsule (20 mEq) on days you take an extra lasix.  Labs: 04/17/2017 Creatinine 1.00, BUN 41, Potassium 4.4, Sodium 132, EGFR 48-56 04/16/2017 Creatinine 1.11, BUN 46, Potassium 4.5, Sodium 129, EGFR 42-49  04/15/2017 Creatinine 1.39, BUN 40, Potassium 4.6, Sodium 128, EGFR 32-37  04/14/2017 Creatinine 1.28, BUN 40, Potassium 4.9, Sodium 128, EGFR 36-41  04/13/2017 Creatinine 1.24, BUN 35, Potassium 4.9, Sodium 130, EGFR 38-43  04/12/2017 Creatinine 1.32, BUN 35, Potassium 4.2, Sodium 129, EGFR 34-40  04/11/2017 Creatinine 1.42, BUN 40, Potassium 4.5, Sodium 125, EGFR 31-36  03/24/2017 Creatinine 1.42, BUN 26, Potassium 3.8, Sodium 133, EGFR 31-36  11/08/2016 Creatinine 1.37, BUN 18, Potassium 3.8, Sodium 135, EGFR 33-38 08/22/2016 Creatinine 1.19, BUN 12, Potassium 3.1, Sodium 139, EGFR 39-46 08/21/2016 Creatinine 1.36, BUN 13, Potassium 3.7, Sodium 137, EGFR 33-39 08/13/2016 Creatinine 1.31, BUN 14, Potassium 3.9, Sodium 139, EGFR 36-42 03/12/2016 Creatinine 1.16, BUN 13, Potassium 4.0, Sodium 137 11/27/2015 Creatinine 1.01, BUN 21, Potassium 3.7, Sodium 132, EGFR 48-56 11/19/2015 Creatinine 0.94, BUN 17, Potassium 3.6, Sodium 133, EGFR 53->60  11/18/2015  Creatinine 1.13, BUN 22, Potassium 4.7, Sodium 132, EGFR 42-49  11/14/2015 Creatinine 1.09, BUN 22, Potassium 3.8, Sodium 132, EGFR 44-51  11/12/2015 Creatinine 1.33, BUN 24, Potassium 3.9, Sodium 129, EGFR 35-40  Creatinine has ranged from 1.12 - 1.61 from 04/30/2015 to 11/07/2015  Recommendations:              Daughter gave patient Metolazone today.   Follow-up plan: ICM clinic phone appointment on 05/11/2017.  Office appointment scheduled 05/01/2017 with Bernerd Pho, PA  Copy of ICM check sent to Dr. Sallyanne Kuster and Bernerd Pho, [place].   3 month ICM trend: 04/30/2017    1 Year ICM trend:       [name], RN 04/30/2017 3:55 PM

## 2017-04-30 NOTE — Progress Notes (Signed)
Cardiology Office Note    Date:  05/01/2017   ID:  STORMIE VENTOLA, DOB 11-18-1924, MRN [id number]  PCP:  Elfredia Nevins, MD  Cardiologist: Thurmon Fair, MD  (Previosuly was going to switch to Dr. Diona Browner but wishes to stay with Dr. Royann Shivers and see an APP in [place])  Chief Complaint  Patient presents with  . Hospitalization Follow-up    History of Present Illness:    Kathleen [name] is a 84 y.o. female with past medical history of high-grade AV block (s/p Medtronic PPM placement in 2013 with Bi-V upgrade and CRT), nonischemic cardiomyopathy (EF 40-45% by echo in 2017, reduced to 25-30% by repeat echo in 03/2017), PAF (on Eliquis and Amiodarone), HTN, and HLD presents to the office today for hospital follow-up.  She was recently admitted to an [facility] from 04/11/2017 to 04/17/2017 for evaluation of worsening dyspnea and lower extremity edema over the past 2 weeks leading up to admission. Also reported a weight gain of over 10 lbs on her home scales. BNP was found to be elevated to 2655 and CXR showing new bilateral pleural effusions with pulmonary vascular congestion. A repeat echocardiogram was obtained and showed her EF is further reduced to 25 to 30% with severe diffuse hypokinesis and akinesis of the entire inferior myocardium and apical myocardium. Cardiology was consulted to assist with volume management and she diuresed well with IV Lasix 40 mg twice daily. She was transitioned to PO Lasix 60 mg daily at the time of discharge and was continued on Amiodarone, Toprol-XL, Pravachol, and Eliquis. Kathleen [name] was initiated during admission due to her reduced EF. Weight was 132 lbs at discharge and creatinine was stable at 1.00 (peaked at 1.39 during admission).   In talking with the patient today, she reports overall feeling similar to when she left the hospital. She has been using 2 L nasal cannula and they have been unable to decrease this due to her reporting feeling short of  breath. She does have persistent dyspnea on exertion. Denies any associated orthopnea, PND, lower extremity edema, chest pain, or palpitations.  Remote transmissions since discharge have suggested fluid accumulation. She was prescribed Metolazone 2.5 mg to take once to twice weekly. She took the first dose of this yesterday and says weight declined by 2 lbs on her home scales (128 --> 126 lbs).    Past Medical History:  Diagnosis Date  . Acid reflux   . AV block, 2nd degree    a. s/p Medtronic PPM placement in 2013 with Bi-V upgrade and CRT  . CAD (coronary artery disease)   . Chronic kidney disease (CKD), stage III (moderate) (HCC)   . Essential hypertension   . History of pneumonia   . Hyperlipidemia   . Macular degeneration   . Nonischemic cardiomyopathy (HCC)    a. EF 40-45% by echo in 2017 b. reduced to 25-30% by repeat echo in 03/2017  . PAF (paroxysmal atrial fibrillation) (HCC)    a. on Eliquis and Amiodarone  . Type 2 diabetes mellitus (HCC)     Past Surgical History:  Procedure Laterality Date  . Bladder tack  1970's  . CARDIAC CATHETERIZATION  10/15/2011   Normal coronaries  . CARDIOVERSION N/A 10/28/2013   Procedure: CARDIOVERSION;  Surgeon: Thurmon Fair, MD;  Location: [facility];  Service: Cardiovascular;  Laterality: N/A;  . CARDIOVERSION N/A 08/20/2016   Procedure: CARDIOVERSION;  Surgeon: Thurmon Fair, MD;  Location: [facility];  Service: Cardiovascular;  Laterality: N/A;  . CHOLECYSTECTOMY  1999  . INTRAMEDULLARY (IM) NAIL INTERTROCHANTERIC Right 11/03/2015   Procedure: INTRAMEDULLARY (IM) NAIL RIGHT INTERTROCHANTERIC HIP FRACTURE;  Surgeon: Tarry Kos, MD;  Location: [facility] OR;  Service: Orthopedics;  Laterality: Right;  . LEFT AND RIGHT HEART CATHETERIZATION WITH CORONARY ANGIOGRAM N/A 10/15/2011   Procedure: LEFT AND RIGHT HEART CATHETERIZATION WITH CORONARY ANGIOGRAM;  Surgeon: Chrystie Nose, MD;  Location: [facility] CATH LAB;  Service: Cardiovascular;   Laterality: N/A;  . NM MYOCAR PERF WALL MOTION  03/15/2009   Normal  . PACEMAKER INSERTION  10/16/2011   Medtronic  . PERMANENT PACEMAKER INSERTION N/A 03/24/2011   Procedure: PERMANENT PACEMAKER INSERTION;  Surgeon: Thurmon Fair, MD;  Location: [facility] CATH LAB;  Service: Cardiovascular;  Laterality: N/A;  . REPLACEMENT TOTAL KNEE      Current Medications: Outpatient Medications Prior to Visit  Medication Sig Dispense Refill  . acetaminophen (TYLENOL) 325 MG tablet Take 650 mg by mouth every 4 (four) hours as needed for mild pain or moderate pain.     Marland Kitchen amiodarone (PACERONE) 200 MG tablet Take 1 tablet (200 mg total) by mouth daily. 30 tablet 2  . AMITIZA 24 MCG capsule TAKE (1) CAPSULE BY MOUTH TWICE DAILY. 60 capsule 5  . Coenzyme Q10 (CO Q 10 PO) Take 1 tablet by mouth daily.    Marland Kitchen DM-Doxylamine-Acetaminophen (NYQUIL COLD & FLU PO) Take 1 capsule by mouth at bedtime as needed (sleep).    Marland Kitchen ELIQUIS 2.5 MG TABS tablet TAKE ONE TABLET BY MOUTH TWICE DAILY. 180 tablet 1  . ferrous sulfate 325 (65 FE) MG tablet Take 325 mg by mouth daily with breakfast.     . furosemide (LASIX) 20 MG tablet Take 3 tablets (60 mg total) by mouth daily. 90 tablet 2  . glimepiride (AMARYL) 1 MG tablet Take 0.5 mg by mouth daily as needed (high blood sugars). Take 0.5 tablet (0.5 mg total) by mouth daily if CBG is >120.    Marland Kitchen metolazone (ZAROXOLYN) 2.5 MG tablet Take 1 tablet by mouth once or twice a week as needed. 24 tablet 3  . metoprolol succinate (TOPROL-XL) 25 MG 24 hr tablet Take 3 tablets (75 mg total) by mouth daily. (Patient taking differently: Take 75 mg by mouth 2 (two) times daily. ) 90 tablet 11  . Multiple Vitamins-Minerals (ICAPS AREDS 2) CAPS Take 1 capsule by mouth twice daily    . nitroGLYCERIN (NITROSTAT) 0.4 MG SL tablet DISSOLVE 1 TABLET UNDER TONGUE EVERY 5 MINUTES UP TO 15 MIN FOR CHESTPAIN. IF NO RELIEF CALL 911. 25 tablet 4  . ondansetron (ZOFRAN) 4 MG tablet Take 4 mg by mouth every 4 (four)  hours as needed for nausea or vomiting.    . potassium chloride SA (K-DUR,KLOR-CON) 20 MEQ tablet Take 1 capsule (20 mEq) daily. Take an extra 1 capsule (20 mEq) on days you take an extra lasix. 45 tablet 3  . pravastatin (PRAVACHOL) 40 MG tablet TAKE 1 TABLET BY MOUTH EACH EVENING. 30 tablet 6  . sacubitril-valsartan (ENTRESTO) 24-26 MG Take 1 tablet by mouth 2 (two) times daily. 60 tablet 2  . traMADol (ULTRAM) 50 MG tablet Take 1 tablet (50 mg total) by mouth every 8 (eight) hours as needed. 20 tablet 0   No facility-administered medications prior to visit.      Allergies:   Coumadin [warfarin sodium]; Meloxicam; Metformin and related; Morphine; Nabumetone; Oxycodone; and Sulfonamide derivatives   Social History   Socioeconomic History  . Marital status: Widowed  Spouse name: Not on file  . Number of children: Not on file  . Years of education: Not on file  . Highest education level: Not on file  Occupational History  . Occupation: retired  Engineer, production  . Financial resource strain: Not on file  . Food insecurity:    Worry: Not on file    Inability: Not on file  . Transportation needs:    Medical: Not on file    Non-medical: Not on file  Tobacco Use  . Smoking status: Never Smoker  . Smokeless tobacco: Never Used  Substance and Sexual Activity  . Alcohol use: No  . Drug use: No  . Sexual activity: Not Currently  Lifestyle  . Physical activity:    Days per week: Not on file    Minutes per session: Not on file  . Stress: Not on file  Relationships  . Social connections:    Talks on phone: Not on file    Gets together: Not on file    Attends religious service: Not on file    Active member of club or organization: Not on file    Attends meetings of clubs or organizations: Not on file    Relationship status: Not on file  Other Topics Concern  . Not on file  Social History Narrative  . Not on file     Family History:  The patient's family history includes Heart  attack in her brother; Heart murmur in her daughter; Suicidality in her mother.   Review of Systems:   Please see the history of present illness.     General:  No chills, fever, night sweats or weight changes. Positive for fatigue.  Cardiovascular:  No chest pain, edema, orthopnea, palpitations, paroxysmal nocturnal dyspnea. Positive for dyspnea on exertion.  Dermatological: No rash, lesions/masses Respiratory: No cough, dyspnea Urologic: No hematuria, dysuria Abdominal:   No nausea, vomiting, diarrhea, bright red blood per rectum, melena, or hematemesis Neurologic:  No visual changes, wkns, changes in mental status. All other systems reviewed and are otherwise negative except as noted above.   Physical Exam:    VS:  BP 118/78   Pulse 97   Ht 5\' 7"  (1.702 m)   Wt 131 lb (59.4 kg)   SpO2 94%   BMI 20.52 kg/m    General: Well developed, elderly Caucasian female appearing in no acute distress. Head: Normocephalic, atraumatic, sclera non-icteric, no xanthomas, nares are without discharge.  Neck: No carotid bruits. JVD at 9cm.  Lungs: Respirations regular and unlabored, mild rales along bases bilaterally. On 2L [place].  Heart: RRR. No S3 or S4.  No murmur, no rubs, or gallops appreciated. Abdomen: Soft, non-tender, non-distended with normoactive bowel sounds. No hepatomegaly. No rebound/guarding. No obvious abdominal masses. Msk:  Strength and tone appear normal for age. No joint deformities or effusions. Extremities: No clubbing or cyanosis. Trace lower extremity edema.  Distal pedal pulses are 2+ bilaterally. Neuro: Alert and oriented X 3. Moves all extremities spontaneously. No focal deficits noted. Psych:  Responds to questions appropriately with a normal affect. Skin: No rashes or lesions noted  Wt Readings from Last 3 Encounters:  05/01/17 131 lb (59.4 kg)  04/17/17 132 lb 11.5 oz (60.2 kg)  03/24/17 118 lb 12.8 oz (53.9 kg)     Studies/Labs Reviewed:   EKG:  EKG is not  ordered today.  Recent Labs: 08/13/2016: Magnesium 2.1; TSH 4.660 04/12/2017: Hemoglobin 12.7; Platelets 191 04/13/2017: ALT 22 04/15/2017: B Natriuretic Peptide 2,186.0 04/17/2017: BUN  41; Creatinine, Ser 1.00; Potassium 4.4; Sodium 132   Lipid Panel    Component Value Date/Time   CHOL 151 04/11/2014 0820   TRIG 108 04/11/2014 0820   HDL 60 04/11/2014 0820   CHOLHDL 2.5 04/11/2014 0820   VLDL 22 04/11/2014 0820   LDLCALC 69 04/11/2014 0820    Additional studies/ records that were reviewed today include:   Echocardiogram: 04/12/2017 Study Conclusions  - Left ventricle: The cavity size was mildly dilated. Systolic   function was severely reduced. The estimated ejection fraction   was in the range of 25% to 30%. Severe diffuse hypokinesis with   distinct regional wall motion abnormalities. There is akinesis of   the entireinferior myocardium. There is akinesis of the   apicalanterior and apical myocardium. There was a reduced   contribution of atrial contraction to ventricular filling, due to   increased ventricular diastolic pressure or atrial contractile   dysfunction. Doppler parameters are consistent with a reversible   restrictive pattern, indicative of decreased left ventricular   diastolic compliance and/or increased left atrial pressure (grade   3 diastolic dysfunction). Doppler parameters are consistent with   high ventricular filling pressure. - Mitral valve: Calcified annulus. Mildly thickened leaflets .   There was mild regurgitation. - Left atrium: The atrium was mildly dilated. - Pulmonary arteries: PA peak pressure: 43 mm Hg (S). - Pericardium, extracardiac: A small, free-flowing pericardial   effusion was identified along the right ventricular free wall and   along the right atrial free wall. The fluid had no internal   echoes.There was no evidence of hemodynamic compromise. There was   a left pleural effusion.  Impressions:  - The right ventricular systolic  pressure was increased consistent   with moderate pulmonary hypertension.   Assessment:    1. Chronic combined systolic and diastolic CHF (congestive heart failure) (HCC)   2. CHB (complete heart block) (HCC)   3. Persistent atrial fibrillation (HCC)   4. Long term current use of anticoagulant   5. Essential hypertension      Plan:   In order of problems listed above:  1. Chronic Combined Systolic and Diastolic CHF - the patient has a known reduced EF of 25 to 30% by most recent echocardiogram. She was recently admitted for an acute CHF exacerbation and weight was 132 lbs at the time of hospital discharge. She continues to have dyspnea on exertion and fatigue. Recent device interrogation was consistent with fluid accumulation.   - Will continue with increased dosing of Lasix 60 mg daily and start Metolazone regularly at 2.5 mg twice weekly. I have informed the patient's daughter to call our office if symptoms do not improve within the next several days. Will arrange for close follow-up within 10 days for further evaluation. - continue Toprol-XL and Entresto at current dosing. She had labs with her PCP on Wednesday. Will request these records to assess kidney function and K+ levels. Will need a repeat BMET at the time of her next visit given the addition of Metolazone.   2. High-grade AV block - s/p Medtronic PPM placement in 2013 with Bi-V upgrade and CRT. - followed by Dr. Royann Shivers. Greatly appreciate reports from Randon Goldsmith regarding the patient's device interrogations.  3. PAF/ Use of Long-Term Anticoagulation - s/p DCCV in 08/2016 - Continue Amiodarone 200 mg daily and Toprol-XL 75 mg daily. - She remains on Eliquis for anticoagulation and denies any evidence of active bleeding.  4. HTN - BP is well controlled  at 118/78 during today's visit. - Continue current medication regimen.  Medication Adjustments/Labs and Tests Ordered: Current medicines are reviewed at length with  the patient today.  Concerns regarding medicines are outlined above.  Medication changes, Labs and Tests ordered today are listed in the Patient Instructions below. Patient Instructions  Medication Instructions:  Your physician recommends that you continue on your current medications as directed. Please refer to the Current Medication list given to you today.  Labwork: NONE   Testing/Procedures: NONE   If you need a refill on your cardiac medications before your next appointment, please call your pharmacy.   TAKE METOLAZONE 30 MINUTES BEFORE TAKING LASIX.  TAKE LASIX 60MG  ONCE DAILY.   Signed, Ellsworth Lennox, PA-C  05/01/2017 4:25 PM    [facility] [address] [place], [place] [address] Phone: [phone]

## 2017-04-30 NOTE — Progress Notes (Signed)
I am glad she is coming to clinic tomorrow. Hopefully metolazone will work by then.

## 2017-05-01 ENCOUNTER — Encounter: Payer: Self-pay | Admitting: Student

## 2017-05-01 ENCOUNTER — Ambulatory Visit: Payer: PPO | Admitting: Student

## 2017-05-01 VITALS — BP 118/78 | HR 97 | Ht 67.0 in | Wt 131.0 lb

## 2017-05-01 DIAGNOSIS — I10 Essential (primary) hypertension: Secondary | ICD-10-CM | POA: Diagnosis not present

## 2017-05-01 DIAGNOSIS — I4819 Other persistent atrial fibrillation: Secondary | ICD-10-CM

## 2017-05-01 DIAGNOSIS — I5042 Chronic combined systolic (congestive) and diastolic (congestive) heart failure: Secondary | ICD-10-CM

## 2017-05-01 DIAGNOSIS — Z7901 Long term (current) use of anticoagulants: Secondary | ICD-10-CM

## 2017-05-01 DIAGNOSIS — I442 Atrioventricular block, complete: Secondary | ICD-10-CM | POA: Diagnosis not present

## 2017-05-01 DIAGNOSIS — I481 Persistent atrial fibrillation: Secondary | ICD-10-CM

## 2017-05-01 NOTE — Progress Notes (Signed)
Thank U B MCr

## 2017-05-01 NOTE — Patient Instructions (Addendum)
Medication Instructions:  Your physician recommends that you continue on your current medications as directed. Please refer to the Current Medication list given to you today.   Labwork: NONE   Testing/Procedures: NONE   Follow-Up: Your physician recommends that you schedule a follow-up appointment in: 2-3 weeks with Jacolyn Reedy, PA-C    Any Other Special Instructions Will Be Listed Below (If Applicable).     If you need a refill on your cardiac medications before your next appointment, please call your pharmacy.   TAKE METOLAZONE 30 MINUTES BEFORE TAKING LASIX.  TAKE LASIX 60MG  ONCE DAILY.

## 2017-05-11 ENCOUNTER — Ambulatory Visit (INDEPENDENT_AMBULATORY_CARE_PROVIDER_SITE_OTHER): Payer: PPO

## 2017-05-11 DIAGNOSIS — Z950 Presence of cardiac pacemaker: Secondary | ICD-10-CM

## 2017-05-11 DIAGNOSIS — I5042 Chronic combined systolic (congestive) and diastolic (congestive) heart failure: Secondary | ICD-10-CM

## 2017-05-11 NOTE — Progress Notes (Signed)
EPIC Encounter for ICM Monitoring  Patient Name: Kathleen [name] is a 84 y.o. female Date: 05/11/2017 Primary Care Physican: Redmond School, MD Primary Cardiologist:Croitoru/Brittany Ahmed Prima, PA Electrophysiologist: Croitoru Dry Weight:120lbs Bi-V Pacing: 99%       Spoke with daughter Kathleen [name].  After taking starting Metolazone on 4/15 patient lost 11.6 pounds (128.6 to 117 lbs).  Today her weight increased by 3 lbs since yesterday and daughter gave her a Metolazone today with extra potassium.   She reported patient has been having some jerking in her arms and legs in the last week but unsure what it is related to.    Thoracic impedance slightly below baseline and showing improvement.  Prescribed dosage: Furosemide20Take3 tablets (60 mg total)daily. Potassium 20 mEqTake 1 capsule (20 mEq) daily. Take an extra 1 capsule (20 mEq) on days you take an extra lasix. Metolazone 2.5 mg 1 tablet twice a week as needed.  She is also taking extra Potassium when taking Metolazone.  Labs: 04/17/2017 Creatinine1.00, BUN41, Potassium4.4, Sodium132, [id number] 04/16/2017 Creatinine1.11, BUN46, Potassium4.5, Sodium129, [id number]  04/15/2017 Creatinine1.39, BUN40, Potassium4.6, [id number], [id number]  04/14/2017 Creatinine1.28, BUN40, Potassium4.9, [id number], [id number]  04/13/2017 Creatinine1.24, BUN35, Potassium4.9, Sodium130, [id number]  04/12/2017 Creatinine1.32, BUN35, Potassium4.2, Sodium129, [id number]  04/11/2017 Creatinine1.42, BUN40, Potassium4.5, [id number], [id number]  03/24/2017 Creatinine1.42, BUN26, Potassium3.8, [id number], [id number] 11/08/2016 Creatinine 1.37, BUN 18, Potassium 3.8, Sodium 135, EGFR 33-38 08/22/2016 Creatinine 1.19, BUN 12, Potassium 3.1, Sodium 139, EGFR 39-46 08/21/2016 Creatinine 1.36, BUN 13, Potassium 3.7, Sodium 137, EGFR 33-39 08/13/2016 Creatinine 1.31, BUN 14, Potassium 3.9, Sodium 139, EGFR 36-42 03/12/2016  Creatinine 1.16, BUN 13, Potassium 4.0, Sodium 137  Recommendations: No changes.    Encouraged to call for fluid symptoms.  Follow-up plan: ICM clinic phone appointment on 05/26/2017.  Office appointment scheduled 05/27/2017 with Kathleen Portland, PA.  Copy of ICM check sent to Dr. Sallyanne Kuster.   3 month ICM trend: 05/11/2017    1 Year ICM trend:      Kathleen Billings, RN 05/11/2017 4:24 PM

## 2017-05-12 NOTE — Progress Notes (Signed)
Thank you, almost got there and then deteriorated a little. MCr

## 2017-05-18 ENCOUNTER — Telehealth: Payer: Self-pay | Admitting: Cardiovascular Disease

## 2017-05-18 ENCOUNTER — Other Ambulatory Visit: Payer: Self-pay | Admitting: *Deleted

## 2017-05-18 ENCOUNTER — Telehealth: Payer: Self-pay

## 2017-05-18 MED ORDER — SACUBITRIL-VALSARTAN 24-26 MG PO TABS: 1.0000 | ORAL_TABLET | Freq: Two times a day (BID) | ORAL | 1 refills | Status: DC

## 2017-05-18 NOTE — Telephone Encounter (Signed)
lmtcb

## 2017-05-18 NOTE — Telephone Encounter (Signed)
New Message:       *STAT* If patient is at the pharmacy, call can be transferred to refill team.   1. Which medications need to be refilled? (please list name of each medication and dose if known) sacubitril-valsartan (ENTRESTO) 24-26 MG  2. Which pharmacy/location (including street and city if local pharmacy) is medication to be sent to?[facility] - [place], [place] - [address]  3. Do they need a 30 day or 90 day supply? 30

## 2017-05-20 DIAGNOSIS — J449 Chronic obstructive pulmonary disease, unspecified: Secondary | ICD-10-CM | POA: Diagnosis not present

## 2017-05-20 DIAGNOSIS — Z96659 Presence of unspecified artificial knee joint: Secondary | ICD-10-CM | POA: Diagnosis not present

## 2017-05-20 DIAGNOSIS — I5040 Unspecified combined systolic (congestive) and diastolic (congestive) heart failure: Secondary | ICD-10-CM | POA: Diagnosis not present

## 2017-05-20 DIAGNOSIS — I5042 Chronic combined systolic (congestive) and diastolic (congestive) heart failure: Secondary | ICD-10-CM | POA: Diagnosis not present

## 2017-05-20 DIAGNOSIS — R269 Unspecified abnormalities of gait and mobility: Secondary | ICD-10-CM | POA: Diagnosis not present

## 2017-05-25 ENCOUNTER — Telehealth: Payer: Self-pay | Admitting: Cardiovascular Disease

## 2017-05-25 NOTE — Telephone Encounter (Signed)
New Message   Pt c/o medication issue:  1. Name of Medication: sacubitril-valsartan (ENTRESTO) 24-26 MG  2. How are you currently taking this medication (dosage and times per day)? Take 1 tablet by mouth 2 (two) times daily  3. Are you having a reaction (difficulty breathing--STAT)? no  4. What is your medication issue? Billy calling from cover my meds to get an prior authorization

## 2017-05-25 NOTE — Telephone Encounter (Signed)
Message routed to the provider's assist.

## 2017-05-26 ENCOUNTER — Ambulatory Visit (INDEPENDENT_AMBULATORY_CARE_PROVIDER_SITE_OTHER): Payer: PPO

## 2017-05-26 DIAGNOSIS — I5042 Chronic combined systolic (congestive) and diastolic (congestive) heart failure: Secondary | ICD-10-CM

## 2017-05-26 DIAGNOSIS — Z950 Presence of cardiac pacemaker: Secondary | ICD-10-CM | POA: Diagnosis not present

## 2017-05-26 NOTE — Telephone Encounter (Signed)
Prior authorization for Sherryll Burger has been approved through 01/12/2018.

## 2017-05-26 NOTE — Telephone Encounter (Signed)
Prior authorization for Entresto faxed to patient's insurance company via [email or web address]. Awaiting approval. 

## 2017-05-26 NOTE — Progress Notes (Signed)
EPIC Encounter for ICM Monitoring  Patient Name: Kathleen [name] is a 84 y.o. female Date: 05/26/2017 Primary Care Physican: Redmond School, MD Primary Cardiologist:Croitoru Electrophysiologist: Croitoru Dry Weight:115lbs Bi-V Pacing: 100%       Spoke with daughter Kathleen [name].  Heart Failure questions reviewed, pt asymptomatic.  Does not have leg/foot swelling.  Woke up bout 2 weeks ago with chest pain but resolved quickly.  She thinks it may have been gas.    Thoracic impedance normal.  Prescribed dosage: Furosemide20Take3 tablets (60 mg total)daily. Potassium 20 mEqTake 1 capsule (20 mEq) daily. Take an extra 1 capsule (20 mEq) on days you take an extra lasix. Metolazone 2.5 mg 1 tablet twice a week as needed.  She is also taking extra Potassium when taking Metolazone.  Labs: 04/17/2017 Creatinine1.00, BUN41, Potassium4.4, Sodium132, [id number] 04/16/2017 Creatinine1.11, BUN46, Potassium4.5, Sodium129, [id number]  04/15/2017 Creatinine1.39, BUN40, Potassium4.6, [id number], [id number]  04/14/2017 Creatinine1.28, BUN40, Potassium4.9, [id number], [id number]  04/13/2017 Creatinine1.24, BUN35, Potassium4.9, Sodium130, [id number]  04/12/2017 Creatinine1.32, BUN35, Potassium4.2, Sodium129, [id number]  04/11/2017 Creatinine1.42, BUN40, Potassium4.5, [id number], [id number]  03/24/2017 Creatinine1.42, BUN26, Potassium3.8, [id number], [id number] 11/08/2016 Creatinine 1.37, BUN 18, Potassium 3.8, Sodium 135, EGFR 33-38 08/22/2016 Creatinine 1.19, BUN 12, Potassium 3.1, Sodium 139, EGFR 39-46 08/21/2016 Creatinine 1.36, BUN 13, Potassium 3.7, Sodium 137, EGFR 33-39 08/13/2016 Creatinine 1.31, BUN 14, Potassium 3.9, Sodium 139, EGFR 36-42 03/12/2016 Creatinine 1.16, BUN 13, Potassium 4.0, Sodium 137  Recommendations: No changes.   Encouraged to call for fluid symptoms.  Follow-up plan: ICM clinic phone appointment on 06/26/2017. Office appointment  scheduled 05/27/2017 with Amie Portland, PA.  Copy of ICM check sent to Dr. Sallyanne Kuster and Amie Portland PA for review at office visit if needed on 05/27/2017.   3 month ICM trend: 05/25/2017    1 Year ICM trend:       [name], RN 05/26/2017 8:54 AM

## 2017-05-27 ENCOUNTER — Ambulatory Visit: Payer: PPO | Admitting: Physician Assistant

## 2017-05-27 ENCOUNTER — Encounter: Payer: Self-pay | Admitting: Physician Assistant

## 2017-05-27 ENCOUNTER — Other Ambulatory Visit (HOSPITAL_COMMUNITY)
Admission: RE | Admit: 2017-05-27 | Discharge: 2017-05-27 | Disposition: A | Discharge status: Home / Self Care | Payer: PPO | Source: Ambulatory Visit | Attending: Physician Assistant | Admitting: Physician Assistant

## 2017-05-27 VITALS — BP 120/66 | HR 73 | Ht 66.0 in | Wt 117.8 lb

## 2017-05-27 DIAGNOSIS — I441 Atrioventricular block, second degree: Secondary | ICD-10-CM

## 2017-05-27 DIAGNOSIS — N183 Chronic kidney disease, stage 3 unspecified: Secondary | ICD-10-CM

## 2017-05-27 DIAGNOSIS — I480 Paroxysmal atrial fibrillation: Secondary | ICD-10-CM

## 2017-05-27 DIAGNOSIS — I5042 Chronic combined systolic (congestive) and diastolic (congestive) heart failure: Secondary | ICD-10-CM | POA: Diagnosis not present

## 2017-05-27 DIAGNOSIS — I10 Essential (primary) hypertension: Secondary | ICD-10-CM | POA: Diagnosis not present

## 2017-05-27 DIAGNOSIS — E7849 Other hyperlipidemia: Secondary | ICD-10-CM | POA: Diagnosis not present

## 2017-05-27 DIAGNOSIS — I420 Dilated cardiomyopathy: Secondary | ICD-10-CM | POA: Diagnosis not present

## 2017-05-27 LAB — BASIC METABOLIC PANEL
Anion gap: 7 (ref 5–15)
BUN: 35 mg/dL — AB (ref 6–20)
CHLORIDE: 83 mmol/L — AB (ref 101–111)
CO2: 42 mmol/L — ABNORMAL HIGH (ref 22–32)
Calcium: 9 mg/dL (ref 8.9–10.3)
Creatinine, Ser: 1.11 mg/dL — ABNORMAL HIGH (ref 0.44–1.00)
GFR calc Af Amer: 49 mL/min — ABNORMAL LOW (ref >60)
GFR calc non Af Amer: 42 mL/min — ABNORMAL LOW (ref >60)
GLUCOSE: 185 mg/dL — AB (ref 65–99)
Potassium: 3.8 mmol/L (ref 3.5–5.1)
Sodium: 132 mmol/L — ABNORMAL LOW (ref 135–145)

## 2017-05-27 NOTE — Patient Instructions (Signed)
Medication Instructions:  Your physician recommends that you continue on your current medications as directed. Please refer to the Current Medication list given to you today.   Labwork: Your physician recommends that you return for lab work today.    Testing/Procedures: NONE   Follow-Up: Your physician recommends that you schedule a follow-up appointment in: 2-3 Months with Dr. Royann Shivers.    Any Other Special Instructions Will Be Listed Below (If Applicable).     If you need a refill on your cardiac medications before your next appointment, please call your pharmacy.  Thank you for choosing [facility]!

## 2017-05-27 NOTE — Progress Notes (Signed)
Thank you, much better MCr

## 2017-05-27 NOTE — Progress Notes (Signed)
Cardiology Office Note    Date:  05/27/2017   ID:  Kathleen [name], DOB 1925-01-08, MRN [id number]  PCP:  Redmond School, MD  Cardiologist: Sanda Klein, MD  Chief Complaint  Patient presents with  . Follow-up    History of Present Illness:  Kathleen [name] is a 84 y.o. female with history of NICM LVEF 25-30% on echo 03/2017, down from 40-45% in 2017,PAF on Amio and Eliquis, Medtronic Pacer upgraded to Bi-V and CRT, HTN, HLD. Patient was discharged 04/17/17 after admission with CHF.  Kathleen [name] was initiated.  She saw [place], PA-C 05/01/2017 with acute on chronic systolic CHF.  Lasix was increased to 60 mg daily and metolazone was added twice weekly.    Remote device check yesterday shows thoracic impedance to be normal.  Patient comes in today accompanied by her daughter.  She is only given her metolazone twice since last office visit.  Overall her breathing is much better and she has no swelling.  Weight is 117 pounds today down from 131 pounds.  Watching salt closely.  Needs handicap sticker renewed.  On O2.    Past Medical History:  Diagnosis Date  . Acid reflux   . AV block, 2nd degree    a. s/p Medtronic PPM placement in 2013 with Bi-V upgrade and CRT  . CAD (coronary artery disease)   . Chronic kidney disease (CKD), stage III (moderate) (HCC)   . Essential hypertension   . History of pneumonia   . Hyperlipidemia   . Macular degeneration   . Nonischemic cardiomyopathy ([place])    a. EF 40-45% by echo in 2017 b. reduced to 25-30% by repeat echo in 03/2017  . PAF (paroxysmal atrial fibrillation) (HCC)    a. on Eliquis and Amiodarone  . Type 2 diabetes mellitus ([place])     Past Surgical History:  Procedure Laterality Date  . Bladder tack  1970's  . CARDIAC CATHETERIZATION  10/15/2011   Normal coronaries  . CARDIOVERSION N/A 10/28/2013   Procedure: CARDIOVERSION;  Surgeon: Sanda Klein, MD;  Location: [facility];  Service: Cardiovascular;  Laterality: N/A;   . CARDIOVERSION N/A 08/20/2016   Procedure: CARDIOVERSION;  Surgeon: Sanda Klein, MD;  Location: [facility];  Service: Cardiovascular;  Laterality: N/A;  . CHOLECYSTECTOMY  1999  . INTRAMEDULLARY (IM) NAIL INTERTROCHANTERIC Right 11/03/2015   Procedure: INTRAMEDULLARY (IM) NAIL RIGHT INTERTROCHANTERIC HIP FRACTURE;  Surgeon: Leandrew Koyanagi, MD;  Location: [facility];  Service: Orthopedics;  Laterality: Right;  . LEFT AND RIGHT HEART CATHETERIZATION WITH CORONARY ANGIOGRAM N/A 10/15/2011   Procedure: LEFT AND RIGHT HEART CATHETERIZATION WITH CORONARY ANGIOGRAM;  Surgeon: Pixie Casino, MD;  Location: [facility];  Service: Cardiovascular;  Laterality: N/A;  . NM MYOCAR PERF WALL MOTION  03/15/2009   Normal  . PACEMAKER INSERTION  10/16/2011   Medtronic  . PERMANENT PACEMAKER INSERTION N/A 03/24/2011   Procedure: PERMANENT PACEMAKER INSERTION;  Surgeon: Sanda Klein, MD;  Location: [facility];  Service: Cardiovascular;  Laterality: N/A;  . REPLACEMENT TOTAL KNEE      Current Medications: Current Meds  Medication Sig  . acetaminophen (TYLENOL) 325 MG tablet Take 650 mg by mouth every 4 (four) hours as needed for mild pain or moderate pain.   Marland Kitchen amiodarone (PACERONE) 200 MG tablet Take 1 tablet (200 mg total) by mouth daily.  . AMITIZA 24 MCG capsule TAKE (1) CAPSULE BY MOUTH TWICE DAILY.  Marland Kitchen Coenzyme Q10 (CO Q 10 PO) Take 1 tablet by mouth  daily.  Marland Kitchen DM-Doxylamine-Acetaminophen (NYQUIL COLD & FLU PO) Take 1 capsule by mouth at bedtime as needed (sleep).  Marland Kitchen ELIQUIS 2.5 MG TABS tablet TAKE ONE TABLET BY MOUTH TWICE DAILY.  . ferrous sulfate 325 (65 FE) MG tablet Take 325 mg by mouth daily with breakfast.   . furosemide (LASIX) 20 MG tablet Take 3 tablets (60 mg total) by mouth daily.  Marland Kitchen glimepiride (AMARYL) 1 MG tablet Take 0.5 mg by mouth daily as needed (high blood sugars). Take 0.5 tablet (0.5 mg total) by mouth daily if CBG is >120.  Marland Kitchen metolazone (ZAROXOLYN) 2.5 MG tablet Take 1 tablet by  mouth once or twice a week as needed.  . metoprolol succinate (TOPROL-XL) 25 MG 24 hr tablet Take 3 tablets (75 mg total) by mouth daily. (Patient taking differently: Take 75 mg by mouth 2 (two) times daily. )  . Multiple Vitamins-Minerals (ICAPS AREDS 2) CAPS Take 1 capsule by mouth twice daily  . nitroGLYCERIN (NITROSTAT) 0.4 MG SL tablet DISSOLVE 1 TABLET UNDER TONGUE EVERY 5 MINUTES UP TO 15 MIN FOR CHESTPAIN. IF NO RELIEF CALL 911.  . ondansetron (ZOFRAN) 4 MG tablet Take 4 mg by mouth every 4 (four) hours as needed for nausea or vomiting.  . potassium chloride SA (K-DUR,KLOR-CON) 20 MEQ tablet Take 1 capsule (20 mEq) daily. Take an extra 1 capsule (20 mEq) on days you take an extra lasix.  Marland Kitchen pravastatin (PRAVACHOL) 40 MG tablet TAKE 1 TABLET BY MOUTH EACH EVENING.  . sacubitril-valsartan (ENTRESTO) 24-26 MG Take 1 tablet by mouth 2 (two) times daily.  . traMADol (ULTRAM) 50 MG tablet Take 1 tablet (50 mg total) by mouth every 8 (eight) hours as needed.     Allergies:   Coumadin [warfarin sodium]; Meloxicam; Metformin and related; Morphine; Nabumetone; Oxycodone; and Sulfonamide derivatives   Social History   Socioeconomic History  . Marital status: Widowed    Spouse name: Not on file  . Number of children: Not on file  . Years of education: Not on file  . Highest education level: Not on file  Occupational History  . Occupation: retired  Scientific laboratory technician  . Financial resource strain: Not on file  . Food insecurity:    Worry: Not on file    Inability: Not on file  . Transportation needs:    Medical: Not on file    Non-medical: Not on file  Tobacco Use  . Smoking status: Never Smoker  . Smokeless tobacco: Never Used  Substance and Sexual Activity  . Alcohol use: No  . Drug use: No  . Sexual activity: Not Currently  Lifestyle  . Physical activity:    Days per week: Not on file    Minutes per session: Not on file  . Stress: Not on file  Relationships  . Social connections:     Talks on phone: Not on file    Gets together: Not on file    Attends religious service: Not on file    Active member of club or organization: Not on file    Attends meetings of clubs or organizations: Not on file    Relationship status: Not on file  Other Topics Concern  . Not on file  Social History Narrative  . Not on file     Family History:  The patient's family history includes Heart attack in her brother; Heart murmur in her daughter; Suicidality in her mother.   ROS:   Please see the history of present illness.  Review of Systems  Constitution: Positive for weight loss.  Cardiovascular: Positive for dyspnea on exertion.  Musculoskeletal: Positive for arthritis.   All other systems reviewed and are negative.   PHYSICAL EXAM:   VS:  BP 120/66   Pulse 73   Ht _0  (1.676 m)   Wt 117 lb 12.8 oz (53.4 kg)   SpO2 98%   BMI 19.01 kg/m   Physical Exam  GEN: Thin, elderly, in no acute distress  Neck: no JVD, carotid bruits, or masses Cardiac:RRR; positive S4, 2/6 systolic murmur at the apex Respiratory: Decreased breath sounds but clear to auscultation bilaterally, normal work of breathing GI: soft, nontender, nondistended, + BS Ext: without cyanosis, clubbing, or edema, Good distal pulses bilaterally Neuro:  Alert and Oriented x 3 Psych: euthymic mood, full affect  Wt Readings from Last 3 Encounters:  05/27/17 117 lb 12.8 oz (53.4 kg)  05/01/17 131 lb (59.4 kg)  04/17/17 132 lb 11.5 oz (60.2 kg)      Studies/Labs Reviewed:   EKG:  EKG is not ordered today.   Recent Labs: 08/13/2016: Magnesium 2.1; TSH 4.660 04/12/2017: Hemoglobin 12.7; Platelets 191 04/13/2017: ALT 22 04/15/2017: B Natriuretic Peptide 2,186.0 04/17/2017: BUN 41; Creatinine, Ser 1.00; Potassium 4.4; Sodium 132   Lipid Panel    Component Value Date/Time   CHOL 151 04/11/2014 0820   TRIG 108 04/11/2014 0820   HDL 60 04/11/2014 0820   CHOLHDL 2.5 04/11/2014 0820   VLDL 22 04/11/2014 0820    LDLCALC 69 04/11/2014 0820    Additional studies/ records that were reviewed today include:   Echocardiogram: 04/12/2017 Study Conclusions   - Left ventricle: The cavity size was mildly dilated. Systolic   function was severely reduced. The estimated ejection fraction   was in the range of 25% to 30%. Severe diffuse hypokinesis with   distinct regional wall motion abnormalities. There is akinesis of   the entireinferior myocardium. There is akinesis of the   apicalanterior and apical myocardium. There was a reduced   contribution of atrial contraction to ventricular filling, due to   increased ventricular diastolic pressure or atrial contractile   dysfunction. Doppler parameters are consistent with a reversible   restrictive pattern, indicative of decreased left ventricular   diastolic compliance and/or increased left atrial pressure (grade   3 diastolic dysfunction). Doppler parameters are consistent with   high ventricular filling pressure. - Mitral valve: Calcified annulus. Mildly thickened leaflets .   There was mild regurgitation. - Left atrium: The atrium was mildly dilated. - Pulmonary arteries: PA peak pressure: 43 mm Hg (S). - Pericardium, extracardiac: A small, free-flowing pericardial   effusion was identified along the right ventricular free wall and   along the right atrial free wall. The fluid had no internal   echoes.There was no evidence of hemodynamic compromise. There was   a left pleural effusion.   Impressions:   - The right ventricular systolic pressure was increased consistent   with moderate pulmonary hypertension.        ASSESSMENT:    1. Chronic combined systolic and diastolic CHF (congestive heart failure) (HCC)   2. Cardiomyopathy, dilated, nonischemic   3. Paroxysmal atrial fibrillation (HCC)   4. Essential hypertension   5. Symptomatic advanced heart block, 2:1 AV block, RT BBB, Lt post. fas. block.(March 2013)   6. CKD (chronic kidney  disease) stage 3, GFR 30-59 ml/min (HCC)      PLAN:  In order of problems listed above:  Chronic  combined systolic and diastolic CHF patient's thoracic impedance has been normal.  Weight has been stable at 117 pounds down from 131 pounds.  Continue Lasix 60 mg daily and metolazone as needed.  We will check be met today to make sure her kidneys and potassium are stable on this current Lasix dose.  Follow-up with Dr. Sallyanne Kuster in 2 to 3 months.  Dilated nonischemic cardiomyopathy ejection fraction now 25 to 30% on echo 03/2017 down from echo in 2017-currently compensated on low-dose Entresto.  Will hold off on titrating up today.  Paroxysmal atrial fibrillationOn amiodarone and Eliquis no bleeding problems  Essential hypertension blood pressure well controlled  Symptomatic heart block status post pacemaker with upgrade to BiV pacer with CRT.    Medication Adjustments/Labs and Tests Ordered: Current medicines are reviewed at length with the patient today.  Concerns regarding medicines are outlined above.  Medication changes, Labs and Tests ordered today are listed in the Patient Instructions below. Patient Instructions  Medication Instructions:  Your physician recommends that you continue on your current medications as directed. Please refer to the Current Medication list given to you today.   Labwork: Your physician recommends that you return for lab work today.    Testing/Procedures: NONE   Follow-Up: Your physician recommends that you schedule a follow-up appointment in: 2-3 Months with Dr. Sallyanne Kuster.    Any Other Special Instructions Will Be Listed Below (If Applicable).     If you need a refill on your cardiac medications before your next appointment, please call your pharmacy.  Thank you for choosing [facility]!      Sumner Boast, PA-C  05/27/2017 [facility] [address], [place], [place]  [address] Phone:  [phone]; Fax: [phone]

## 2017-05-28 ENCOUNTER — Telehealth: Payer: Self-pay | Admitting: *Deleted

## 2017-05-28 DIAGNOSIS — Z79899 Other long term (current) drug therapy: Secondary | ICD-10-CM

## 2017-05-28 NOTE — Telephone Encounter (Signed)
-----   Message from Dyann Kief, PA-C sent at 05/27/2017  3:07 PM EDT ----- Kidney function up a little bit.  Would recheck again in 3 weeks.  Continue current dose Lasix unless she continues to lose weight then she should call us.

## 2017-05-28 NOTE — Telephone Encounter (Signed)
Patient and daughter notified of lab results

## 2017-06-20 DIAGNOSIS — R269 Unspecified abnormalities of gait and mobility: Secondary | ICD-10-CM | POA: Diagnosis not present

## 2017-06-20 DIAGNOSIS — Z96659 Presence of unspecified artificial knee joint: Secondary | ICD-10-CM | POA: Diagnosis not present

## 2017-06-20 DIAGNOSIS — I5040 Unspecified combined systolic (congestive) and diastolic (congestive) heart failure: Secondary | ICD-10-CM | POA: Diagnosis not present

## 2017-06-20 DIAGNOSIS — J449 Chronic obstructive pulmonary disease, unspecified: Secondary | ICD-10-CM | POA: Diagnosis not present

## 2017-06-20 DIAGNOSIS — I5042 Chronic combined systolic (congestive) and diastolic (congestive) heart failure: Secondary | ICD-10-CM | POA: Diagnosis not present

## 2017-06-26 ENCOUNTER — Telehealth: Payer: Self-pay

## 2017-06-26 ENCOUNTER — Ambulatory Visit (INDEPENDENT_AMBULATORY_CARE_PROVIDER_SITE_OTHER): Payer: PPO

## 2017-06-26 DIAGNOSIS — I5042 Chronic combined systolic (congestive) and diastolic (congestive) heart failure: Secondary | ICD-10-CM | POA: Diagnosis not present

## 2017-06-26 DIAGNOSIS — Z950 Presence of cardiac pacemaker: Secondary | ICD-10-CM

## 2017-06-26 NOTE — Progress Notes (Signed)
EPIC Encounter for ICM Monitoring  Patient Name: Kathleen [name] is a 84 y.o. female Date: 06/26/2017 Primary Care Physican: Redmond School, MD Primary Cardiologist:Croitoru Electrophysiologist: Croitoru Dry Weight:105-113lbs Bi-V Pacing: 100%        Spoke with daughter Kathleen [name].  Heart Failure questions reviewed, pt is feeling so much better since fluid accumulation has resolved.   Thoracic impedance normal.  Prescribed dosage: Furosemide20Take3 tablets (60 mg total)daily. Potassium 20 mEqTake 1 capsule (20 mEq) daily. Take an extra 1 capsule (20 mEq) on days you take an extra lasix. Metolazone 2.5 mg 1 tablet twice a week as needed. She is also taking extra Potassium when taking Metolazone.  Labs: 05/27/2017 Creatinine 1.11, BUN 35, Potassium 3.8, Sodium 132, EGFR 42-49 04/17/2017 Creatinine1.00, BUN41, Potassium4.4, [id number], [id number] 04/16/2017 Creatinine1.11, BUN46, Potassium4.5, Sodium129, [id number]  04/15/2017 Creatinine1.39, BUN40, Potassium4.6, [id number], ONGE95-28  04/14/2017 Creatinine1.28, BUN40, Potassium4.9, [id number], UVOZ36-64  04/13/2017 Creatinine1.24, BUN35, Potassium4.9, Sodium130, QIHK74-25  04/12/2017 Creatinine1.32, BUN35, Potassium4.2, ZDGLOV564, PPIR51-88  04/11/2017 Creatinine1.42, BUN40, Potassium4.5, CZYSAY301, SWFU93-23  03/24/2017 Creatinine1.42, BUN26, Potassium3.8, FTDDUK025, KYHC62-37 11/08/2016 Creatinine 1.37, BUN 18, Potassium 3.8, Sodium 135, EGFR 33-38 08/22/2016 Creatinine 1.19, BUN 12, Potassium 3.1, Sodium 139, EGFR 39-46 08/21/2016 Creatinine 1.36, BUN 13, Potassium 3.7, Sodium 137, EGFR 33-39 08/13/2016 Creatinine 1.31, BUN 14, Potassium 3.9, Sodium 139, EGFR 36-42 03/12/2016 Creatinine 1.16, BUN 13, Potassium 4.0, Sodium 137  Recommendations: No changes.  Encouraged to call for fluid symptoms.  Follow-up plan: ICM clinic phone appointment on 07/27/2017.  Office appointment  scheduled 09/03/2017 with Dr. Sallyanne Kuster.  Copy of ICM check sent to Dr. Sallyanne Kuster.   3 month ICM trend: 06/26/2017    1 Year ICM trend:       Kathleen Billings, RN 06/26/2017 10:54 AM

## 2017-06-26 NOTE — Telephone Encounter (Signed)
Spoke with daughter and reminded to send remote transmission today. She verbalized understanding.

## 2017-06-26 NOTE — Progress Notes (Signed)
Better. Thanks Google

## 2017-07-20 DIAGNOSIS — I5040 Unspecified combined systolic (congestive) and diastolic (congestive) heart failure: Secondary | ICD-10-CM | POA: Diagnosis not present

## 2017-07-20 DIAGNOSIS — R269 Unspecified abnormalities of gait and mobility: Secondary | ICD-10-CM | POA: Diagnosis not present

## 2017-07-20 DIAGNOSIS — I5042 Chronic combined systolic (congestive) and diastolic (congestive) heart failure: Secondary | ICD-10-CM | POA: Diagnosis not present

## 2017-07-20 DIAGNOSIS — J449 Chronic obstructive pulmonary disease, unspecified: Secondary | ICD-10-CM | POA: Diagnosis not present

## 2017-07-20 DIAGNOSIS — Z96659 Presence of unspecified artificial knee joint: Secondary | ICD-10-CM | POA: Diagnosis not present

## 2017-07-27 ENCOUNTER — Ambulatory Visit (INDEPENDENT_AMBULATORY_CARE_PROVIDER_SITE_OTHER): Payer: PPO

## 2017-07-27 DIAGNOSIS — Z950 Presence of cardiac pacemaker: Secondary | ICD-10-CM

## 2017-07-27 DIAGNOSIS — I5042 Chronic combined systolic (congestive) and diastolic (congestive) heart failure: Secondary | ICD-10-CM | POA: Diagnosis not present

## 2017-07-27 NOTE — Progress Notes (Signed)
EPIC Encounter for ICM Monitoring  Patient Name: Kathleen [name] is a 84 y.o. female Date: 07/27/2017 Primary Care Physican: Redmond School, MD Primary Cardiologist:Croitoru Electrophysiologist: Croitoru Dry Weight:105-113lbs Bi-V Pacing: 100%                                                                        Spoke with daughter Kathleen [name].  Heart Failure questions reviewed, pt asymptomatic for fluid symptoms.  She said she had chest pain in the last couple of weeks which resolved by NTG.  She gets blue around her lips without oxygen. There are other changes such as a loss of hearing and change in mood that has been affecting her in the last couple of weeks.   Encouraged to use ER if needed.     Thoracic impedance normal.  Prescribed dosage: Furosemide20Take3 tablets (60 mg total)daily. Potassium 20 mEqTake 1 capsule (20 mEq) daily. Take an extra 1 capsule (20 mEq) on days you take an extra lasix. Metolazone 2.5 mg 1 tablet twice a week as needed. She is also taking extra Potassium when taking Metolazone.  Labs: 05/27/2017 Creatinine 1.11, BUN 35, Potassium 3.8, Sodium 132, EGFR 42-49 04/17/2017 Creatinine1.00, BUN41, Potassium4.4, Sodium132, ITUY29-03 04/16/2017 Creatinine1.11, BUN46, Potassium4.5, Sodium129, PNDL83-16  04/15/2017 Creatinine1.39, BUN40, Potassium4.6, FOADLK589, UQXA75-83  04/14/2017 Creatinine1.28, BUN40, Potassium4.9, EXOGAC298, ORJG85-69  04/13/2017 Creatinine1.24, BUN35, Potassium4.9, Sodium130, APTC05-25  04/12/2017 Creatinine1.32, BUN35, Potassium4.2, LTGAID022, MOCA98-61  04/11/2017 Creatinine1.42, BUN40, Potassium4.5, EADGNP543, ETUY40-39  03/24/2017 Creatinine1.42, BUN26, Potassium3.8, JXFFKV223, COBT94-99  Recommendations: No changes.   Encouraged to call for fluid symptoms.  Follow-up plan: ICM clinic phone appointment on 08/27/2017.   Office appointment scheduled 09/03/2017 with Dr. Sallyanne Kuster.      Copy of ICM check sent to Dr. Sallyanne Kuster.   3 month ICM trend: 07/27/2017    1 Year ICM trend:       Kathleen Billings, RN 07/27/2017 12:57 PM

## 2017-07-28 NOTE — Progress Notes (Signed)
Thank you MCr 

## 2017-08-13 DIAGNOSIS — Z0000 Encounter for general adult medical examination without abnormal findings: Secondary | ICD-10-CM | POA: Diagnosis not present

## 2017-08-13 DIAGNOSIS — E1129 Type 2 diabetes mellitus with other diabetic kidney complication: Secondary | ICD-10-CM | POA: Diagnosis not present

## 2017-08-13 DIAGNOSIS — M79605 Pain in left leg: Secondary | ICD-10-CM | POA: Diagnosis not present

## 2017-08-13 DIAGNOSIS — M545 Low back pain: Secondary | ICD-10-CM | POA: Diagnosis not present

## 2017-08-13 DIAGNOSIS — Z1389 Encounter for screening for other disorder: Secondary | ICD-10-CM | POA: Diagnosis not present

## 2017-08-17 ENCOUNTER — Other Ambulatory Visit: Payer: Self-pay | Admitting: Cardiovascular Disease

## 2017-08-20 DIAGNOSIS — I5040 Unspecified combined systolic (congestive) and diastolic (congestive) heart failure: Secondary | ICD-10-CM | POA: Diagnosis not present

## 2017-08-20 DIAGNOSIS — J449 Chronic obstructive pulmonary disease, unspecified: Secondary | ICD-10-CM | POA: Diagnosis not present

## 2017-08-20 DIAGNOSIS — Z96659 Presence of unspecified artificial knee joint: Secondary | ICD-10-CM | POA: Diagnosis not present

## 2017-08-20 DIAGNOSIS — R269 Unspecified abnormalities of gait and mobility: Secondary | ICD-10-CM | POA: Diagnosis not present

## 2017-08-20 DIAGNOSIS — I5042 Chronic combined systolic (congestive) and diastolic (congestive) heart failure: Secondary | ICD-10-CM | POA: Diagnosis not present

## 2017-08-25 ENCOUNTER — Other Ambulatory Visit: Payer: Self-pay | Admitting: Cardiovascular Disease

## 2017-08-27 ENCOUNTER — Ambulatory Visit (INDEPENDENT_AMBULATORY_CARE_PROVIDER_SITE_OTHER): Payer: PPO

## 2017-08-27 ENCOUNTER — Telehealth: Payer: Self-pay | Admitting: Cardiology

## 2017-08-27 DIAGNOSIS — I5042 Chronic combined systolic (congestive) and diastolic (congestive) heart failure: Secondary | ICD-10-CM

## 2017-08-27 DIAGNOSIS — Z950 Presence of cardiac pacemaker: Secondary | ICD-10-CM

## 2017-08-27 NOTE — Telephone Encounter (Signed)
Confirmed remote transmission w/ pt dauhgter 

## 2017-08-28 NOTE — Progress Notes (Signed)
Agree, seems "dry".  thank you MCr

## 2017-08-28 NOTE — Progress Notes (Signed)
EPIC Encounter for ICM Monitoring  Patient Name: Kathleen [name] is a 84 y.o. female Date: 08/28/2017 Primary Care Physican: Redmond School, MD Primary Cardiologist:Croitoru Electrophysiologist: Croitoru Dry Weight:107-109 lbs Bi-V Pacing: 100%      Spoke with daughter [name]. Heart Failure questions reviewed, pt asymptomatic for fluid symptoms.  She has been feeling weak in the last couple of days.  Advised that report suggests that she may be dehydrated for the last couple of days.  Advised to increase fluid for a couple of days.   Thoracic impedance normal.  Prescribed dosage: Furosemide20Take3 tablets (60 mg total)daily. Potassium 20 mEqTake 1 capsule (20 mEq) daily. Take an extra 1 capsule (20 mEq) on days you take an extra lasix. Metolazone 2.5 mg 1 tablet twice a week as needed. She is also taking extra Potassium when taking Metolazone.  Labs: 05/27/2017 Creatinine 1.11, BUN 35, Potassium 3.8, Sodium 132, EGFR 42-49 04/17/2017 Creatinine1.00, BUN41, Potassium4.4, Sodium132, PPIR51-88 04/16/2017 Creatinine1.11, BUN46, Potassium4.5, Sodium129, CZYS06-30  04/15/2017 Creatinine1.39, BUN40, Potassium4.6, ZSWFUX323, FTDD22-02  04/14/2017 Creatinine1.28, BUN40, Potassium4.9, RKYHCW237, SEGB15-17  04/13/2017 Creatinine1.24, BUN35, Potassium4.9, Sodium130, OHYW73-71  04/12/2017 Creatinine1.32, BUN35, Potassium4.2, GGYIRS854, OEVO35-00  04/11/2017 Creatinine1.42, BUN40, Potassium4.5, XFGHWE993, ZJIR67-89  03/24/2017 Creatinine1.42, BUN26, Potassium3.8, FYBOFB510, CHEN27-78  Recommendations: No changes.   Encouraged to call for fluid symptoms.  Follow-up plan: ICM clinic phone appointment on 10/05/2017.   Office appointment scheduled 09/03/2017 with Dr. Sallyanne Kuster.    Copy of ICM check sent to Dr. Sallyanne Kuster.   3 month ICM trend: 08/27/2017    1 Year ICM trend:       [name], RN 08/28/2017 10:48 AM

## 2017-09-03 ENCOUNTER — Ambulatory Visit: Payer: PPO | Admitting: Cardiovascular Disease

## 2017-09-03 ENCOUNTER — Encounter: Payer: Self-pay | Admitting: Cardiovascular Disease

## 2017-09-03 VITALS — BP 128/69 | HR 77 | Ht 68.0 in | Wt 113.2 lb

## 2017-09-03 DIAGNOSIS — E43 Unspecified severe protein-calorie malnutrition: Secondary | ICD-10-CM

## 2017-09-03 DIAGNOSIS — I441 Atrioventricular block, second degree: Secondary | ICD-10-CM | POA: Diagnosis not present

## 2017-09-03 DIAGNOSIS — I5042 Chronic combined systolic (congestive) and diastolic (congestive) heart failure: Secondary | ICD-10-CM

## 2017-09-03 DIAGNOSIS — Z45018 Encounter for adjustment and management of other part of cardiac pacemaker: Secondary | ICD-10-CM

## 2017-09-03 DIAGNOSIS — I428 Other cardiomyopathies: Secondary | ICD-10-CM | POA: Diagnosis not present

## 2017-09-03 DIAGNOSIS — Z7901 Long term (current) use of anticoagulants: Secondary | ICD-10-CM | POA: Diagnosis not present

## 2017-09-03 DIAGNOSIS — I480 Paroxysmal atrial fibrillation: Secondary | ICD-10-CM | POA: Diagnosis not present

## 2017-09-03 LAB — TSH: TSH: 3.5 u[IU]/mL (ref 0.450–4.500)

## 2017-09-03 LAB — CUP PACEART INCLINIC DEVICE CHECK
Implantable Lead Implant Date: 20131003
Implantable Lead Location: 753859
Implantable Lead Location: 753860
Lead Channel Setting Pacing Amplitude: 2.25 V
Lead Channel Setting Pacing Amplitude: 2.5 V
Lead Channel Setting Pacing Pulse Width: 0.4 ms
MDC IDC LEAD IMPLANT DT: 20130311
MDC IDC LEAD IMPLANT DT: 20130311
MDC IDC LEAD LOCATION: 753858
MDC IDC PG IMPLANT DT: 20131003
MDC IDC SESS DTM: 20190822143951
MDC IDC SET LEADCHNL RA PACING AMPLITUDE: 2 V
MDC IDC SET LEADCHNL RV PACING PULSEWIDTH: 0.4 ms
MDC IDC SET LEADCHNL RV SENSING SENSITIVITY: 4 mV

## 2017-09-03 LAB — COMPREHENSIVE METABOLIC PANEL
A/G RATIO: 1.4 (ref 1.2–2.2)
ALT: 17 IU/L (ref 0–32)
AST: 20 IU/L (ref 0–40)
Albumin: 3.8 g/dL (ref 3.2–4.6)
Alkaline Phosphatase: 108 IU/L (ref 39–117)
BILIRUBIN TOTAL: 0.4 mg/dL (ref 0.0–1.2)
BUN/Creatinine Ratio: 36 — ABNORMAL HIGH (ref 12–28)
BUN: 43 mg/dL — ABNORMAL HIGH (ref 10–36)
CALCIUM: 9.2 mg/dL (ref 8.7–10.3)
CHLORIDE: 94 mmol/L — AB (ref 96–106)
CO2: 22 mmol/L (ref 20–29)
Creatinine, Ser: 1.18 mg/dL — ABNORMAL HIGH (ref 0.57–1.00)
GFR calc Af Amer: 47 mL/min/{1.73_m2} — ABNORMAL LOW (ref >59)
GFR, EST NON AFRICAN AMERICAN: 41 mL/min/{1.73_m2} — AB (ref >59)
GLOBULIN, TOTAL: 2.8 g/dL (ref 1.5–4.5)
Glucose: 249 mg/dL — ABNORMAL HIGH (ref 65–99)
POTASSIUM: 4.2 mmol/L (ref 3.5–5.2)
SODIUM: 136 mmol/L (ref 134–144)
Total Protein: 6.6 g/dL (ref 6.0–8.5)

## 2017-09-03 NOTE — Patient Instructions (Signed)
Medication Instructions: Dr Royann Shivers recommends that you continue on your current medications as directed. Please refer to the Current Medication list given to you today.  Labwork: Your physician recommends that you return for lab work TODAY.  Testing/Procedures: NONE ORDERED  Follow-up: Your physician recommends that you schedule a follow-up appointment in 3 months with Dr Diona Browner in [place].  Your physician recommends that you schedule a follow-up appointment in 3 months with Dr Ladona Ridgel with a pacemaker check in [place].  If you need a refill on your cardiac medications before your next appointment, please call your pharmacy.

## 2017-09-03 NOTE — Progress Notes (Signed)
Thanks! Old-C

## 2017-09-03 NOTE — Progress Notes (Signed)
Patient ID: Kathleen [name], female   DOB: 1924-10-07, 84 y.o.   MRN: [id number] Patient ID: Kathleen [name], female   DOB: [birth year], 84 y.o.   MRN: [id number]    Cardiology Office Note    Date:  09/03/2017   ID:  Kathleen [name], DOB February 09, 1924, MRN [id number]  PCP:  Elfredia Nevins, MD  Cardiologist:   Thurmon Fair, MD   Chief Complaint  Patient presents with  . Congestive Heart Failure    History of Present Illness:  Kathleen [name] is a 84 y.o. female with long-standing nonischemic cardiomyopathy, high-grade AV block requiring permanent pacemaker therapy (upgrade to biventricular pacemaker after she developed congestive heart failure, excellent response to CRT), subsequent deterioration in  left ventricular systolic function  (most recent LVEF 25-30% March 2019), paroxysmal atrial fibrillation on chronic amiodarone and Eliquis therapy, hyperlipidemia, in for heart failure follow-up and pacemaker check.   She has not required repeat hospitalization since her admission for heart failure in March.  She required diuretic escalation earlier this year on a couple of occasions, for episodes of heart failure exacerbation, but none in the last 3 months.  She has not required metolazone in several weeks.  She is still wearing oxygen supplements by nasal cannula especially when she is walking and also at night.  She has stayed off her oxygen while sitting in a chair for several hours at a time without symptoms.  She has lost quite a bit of weight.  At her hospital discharge in March she weighed 132 pounds and today she is down to 113.  She is frankly malnourished with a BMI of only 17.  However she reports that recently her appetite has been improving and she is eating much better.  Her daughter confirms improvement in her appetite.  She denies dizziness, syncope or angina.  She is very sedentary.  She is compliant with anticoagulation and has not had falls or bleeding  complications.  Pacemaker interrogation shows her Medtronic consult of CRT-P device implanted in 2013 still has 2.5 years of estimated generator longevity.  There has been no atrial fibrillation recorded since her cardioversion in August 2018.  There is 99.7% biventricular pacing.  There is 100% atrial pacing.  Her thoracic impedance suggests that she has recently been "dry" (we subsequently reduced her diuretic after phone discussion).  Past Medical History:  Diagnosis Date  . Acid reflux   . AV block, 2nd degree    a. s/p Medtronic PPM placement in 2013 with Bi-V upgrade and CRT  . CAD (coronary artery disease)   . Chronic kidney disease (CKD), stage III (moderate) (HCC)   . Essential hypertension   . History of pneumonia   . Hyperlipidemia   . Macular degeneration   . Nonischemic cardiomyopathy (HCC)    a. EF 40-45% by echo in 2017 b. reduced to 25-30% by repeat echo in 03/2017  . PAF (paroxysmal atrial fibrillation) (HCC)    a. on Eliquis and Amiodarone  . Type 2 diabetes mellitus (HCC)     Past Surgical History:  Procedure Laterality Date  . Bladder tack  1970's  . CARDIAC CATHETERIZATION  10/15/2011   Normal coronaries  . CARDIOVERSION N/A 10/28/2013   Procedure: CARDIOVERSION;  Surgeon: Thurmon Fair, MD;  Location: [facility];  Service: Cardiovascular;  Laterality: N/A;  . CARDIOVERSION N/A 08/20/2016   Procedure: CARDIOVERSION;  Surgeon: Thurmon Fair, MD;  Location: [facility];  Service: Cardiovascular;  Laterality: N/A;  . CHOLECYSTECTOMY  1999  .  INTRAMEDULLARY (IM) NAIL INTERTROCHANTERIC Right 11/03/2015   Procedure: INTRAMEDULLARY (IM) NAIL RIGHT INTERTROCHANTERIC HIP FRACTURE;  Surgeon: Tarry Kos, MD;  Location: [facility] OR;  Service: Orthopedics;  Laterality: Right;  . LEFT AND RIGHT HEART CATHETERIZATION WITH CORONARY ANGIOGRAM N/A 10/15/2011   Procedure: LEFT AND RIGHT HEART CATHETERIZATION WITH CORONARY ANGIOGRAM;  Surgeon: Chrystie Nose, MD;  Location: [facility]  CATH LAB;  Service: Cardiovascular;  Laterality: N/A;  . NM MYOCAR PERF WALL MOTION  03/15/2009   Normal  . PACEMAKER INSERTION  10/16/2011   Medtronic  . PERMANENT PACEMAKER INSERTION N/A 03/24/2011   Procedure: PERMANENT PACEMAKER INSERTION;  Surgeon: Thurmon Fair, MD;  Location: [facility] CATH LAB;  Service: Cardiovascular;  Laterality: N/A;  . REPLACEMENT TOTAL KNEE      Current Medications: Outpatient Medications Prior to Visit  Medication Sig Dispense Refill  . acetaminophen (TYLENOL) 325 MG tablet Take 650 mg by mouth every 4 (four) hours as needed for mild pain or moderate pain.     Marland Kitchen amiodarone (PACERONE) 200 MG tablet Take 1 tablet (200 mg total) by mouth daily. 30 tablet 2  . AMITIZA 24 MCG capsule TAKE (1) CAPSULE BY MOUTH TWICE DAILY. 60 capsule 5  . Coenzyme Q10 (CO Q 10 PO) Take 1 tablet by mouth daily.    Marland Kitchen DM-Doxylamine-Acetaminophen (NYQUIL COLD & FLU PO) Take 1 capsule by mouth at bedtime as needed (sleep).    Marland Kitchen ELIQUIS 2.5 MG TABS tablet TAKE ONE TABLET BY MOUTH TWICE DAILY. 60 tablet 2  . ENTRESTO 24-26 MG TAKE (1) TABLET BY MOUTH TWICE DAILY. 60 tablet 0  . ferrous sulfate 325 (65 FE) MG tablet Take 325 mg by mouth daily with breakfast.     . furosemide (LASIX) 20 MG tablet Take 3 tablets (60 mg total) by mouth daily. 90 tablet 2  . glimepiride (AMARYL) 1 MG tablet Take 0.5 mg by mouth daily as needed (high blood sugars). Take 0.5 tablet (0.5 mg total) by mouth daily if CBG is >120.    Marland Kitchen metolazone (ZAROXOLYN) 2.5 MG tablet Take 1 tablet by mouth once or twice a week as needed. 24 tablet 3  . metoprolol succinate (TOPROL-XL) 25 MG 24 hr tablet Take 3 tablets (75 mg total) by mouth daily. (Patient taking differently: Take 75 mg by mouth 2 (two) times daily. ) 90 tablet 11  . nitroGLYCERIN (NITROSTAT) 0.4 MG SL tablet DISSOLVE 1 TABLET UNDER TONGUE EVERY 5 MINUTES UP TO 15 MIN FOR CHESTPAIN. IF NO RELIEF CALL 911. 25 tablet 4  . ondansetron (ZOFRAN) 4 MG tablet Take 4 mg by mouth  every 4 (four) hours as needed for nausea or vomiting.    . potassium chloride SA (K-DUR,KLOR-CON) 20 MEQ tablet Take 1 capsule (20 mEq) daily. Take an extra 1 capsule (20 mEq) on days you take an extra lasix. 45 tablet 3  . pravastatin (PRAVACHOL) 40 MG tablet TAKE 1 TABLET BY MOUTH EACH EVENING. 30 tablet 6  . Multiple Vitamins-Minerals (ICAPS AREDS 2) CAPS Take 1 capsule by mouth twice daily    . traMADol (ULTRAM) 50 MG tablet Take 1 tablet (50 mg total) by mouth every 8 (eight) hours as needed. 20 tablet 0   No facility-administered medications prior to visit.      Allergies:   Coumadin [warfarin sodium]; Meloxicam; Metformin and related; Morphine; Nabumetone; Oxycodone; and Sulfonamide derivatives   Social History   Socioeconomic History  . Marital status: Widowed    Spouse name: Not on file  .  Number of children: Not on file  . Years of education: Not on file  . Highest education level: Not on file  Occupational History  . Occupation: retired  Engineer, production  . Financial resource strain: Not on file  . Food insecurity:    Worry: Not on file    Inability: Not on file  . Transportation needs:    Medical: Not on file    Non-medical: Not on file  Tobacco Use  . Smoking status: Never Smoker  . Smokeless tobacco: Never Used  Substance and Sexual Activity  . Alcohol use: No  . Drug use: No  . Sexual activity: Not Currently  Lifestyle  . Physical activity:    Days per week: Not on file    Minutes per session: Not on file  . Stress: Not on file  Relationships  . Social connections:    Talks on phone: Not on file    Gets together: Not on file    Attends religious service: Not on file    Active member of club or organization: Not on file    Attends meetings of clubs or organizations: Not on file    Relationship status: Not on file  Other Topics Concern  . Not on file  Social History Narrative  . Not on file     Family History:  The patient's family history includes  Heart attack in her brother; Heart murmur in her daughter; Suicidality in her mother.   ROS:   Please see the history of present illness.    ROS all other systems are reviewed and are negative  PHYSICAL EXAM:   VS:  BP 128/69   Pulse 77   Ht 5\' 8"  (1.727 m)   Wt 113 lb 3.2 oz (51.3 kg)   BMI 17.21 kg/m     General: Alert, oriented x3, no distress, very lean, borderline cachectic Head: no evidence of trauma, PERRL, EOMI, no exophtalmos or lid lag, no myxedema, no xanthelasma; normal ears, nose and oropharynx Neck: normal jugular venous pulsations and no hepatojugular reflux; brisk carotid pulses without delay and no carotid bruits Chest: clear to auscultation, no signs of consolidation by percussion or palpation, normal fremitus, symmetrical and full respiratory excursions.  Healthy pacemaker site. Cardiovascular: normal position and quality of the apical impulse, regular rhythm, normal first and paradoxically split second heart sounds, no murmurs, rubs or gallops Abdomen: no tenderness or distention, no masses by palpation, no abnormal pulsatility or arterial bruits, normal bowel sounds, no hepatosplenomegaly Extremities: no clubbing, cyanosis or edema; 2+ radial, ulnar and brachial pulses bilaterally; 2+ right femoral, posterior tibial and dorsalis pedis pulses; 2+ left femoral, posterior tibial and dorsalis pedis pulses; no subclavian or femoral bruits Neurological: grossly nonfocal Psych: Normal mood and affect  Wt Readings from Last 3 Encounters:  09/03/17 113 lb 3.2 oz (51.3 kg)  05/27/17 117 lb 12.8 oz (53.4 kg)  05/01/17 131 lb (59.4 kg)   Studies/Labs Reviewed:   EKG:  EKG is ordered today.  Shows AV sequential pacing with positive R waves in lead V1  Recent Labs: 04/12/2017: Hemoglobin 12.7; Platelets 191 04/15/2017: B Natriuretic Peptide 2,186.0 09/03/2017: ALT 17; BUN 43; Creatinine, Ser 1.18; Potassium 4.2; Sodium 136; TSH 3.500   Lipid Panel    Component Value  Date/Time   CHOL 151 04/11/2014 0820   TRIG 108 04/11/2014 0820   HDL 60 04/11/2014 0820   CHOLHDL 2.5 04/11/2014 0820   VLDL 22 04/11/2014 0820   LDLCALC 69 04/11/2014 0820  ASSESSMENT:    1. Chronic combined systolic and diastolic heart failure (HCC)   2. Nonischemic cardiomyopathy (HCC)   3. Paroxysmal atrial fibrillation (HCC)   4. Severe protein-calorie malnutrition (HCC)   5. Symptomatic advanced heart block, 2:1 AV block, RT BBB, Lt post. fas. block.(March 2013)   6. Biventricular pacemaker check   7. Long term current use of anticoagulant      PLAN:  In order of problems listed above:  1. CHF: As far as I can feel she is euvolemic.  She has lost a tremendous amount of real weight.  She might still be a little dry but nevertheless I think we can estimate their current dry weight to be around 115 pounds.  She is tolerating Entresto. She has a severely dilated left atrium and moderate mitral insufficiency, likely secondary to the cardiomyopathy .  Her last echo showed some regional wall motion abnormalities, I am not sure if this can be attributed to asynchrony from ventricular pacing. 2. CMP: Nonischemic on her evaluation before device implantation in 2013, has never complained of angina. CRT hyper-responder with subsequent deterioration in LV function.  Evaluation for coronary artery disease or other secondary causes for cardiomyopathy does not appear indicated in this 85 year old. 3. AFib:  CHADSVasc 5 (age 62, gender, HF, HTN).  Has maintained sinus rhythm without interruption for 12 months now.  Need to check liver function tests and thyroid function tests. 4. 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 5. 2nd deg AVB/CHB: Over the years and with added treatment with amiodarone, she has progressed to essentially complete heart block. She should be considered  pacemaker dependent. 6. CRT-P: Normal function of her dual-chamber biventricular pacemaker. All lead parameters are in normal range.  Left ventricular capture is demonstrated by the positive R waves in V1.  7. Anticoagulation: dose of Eliquis adjusted for age and small body size.   Mrs. Vida and her daughter would like to continue cardiology follow-up closer to their home, in [place].  They were very pleased with the care given to them by Dr. Simona Huh during her hospitalization and by Randall An, PA at the last office follow-up.  We will make arrangements for clinical follow-up with them and also schedule device follow-up in Dr. Lubertha [facility] regional clinic.   Medication Adjustments/Labs and Tests Ordered: Current medicines are reviewed at length with the patient today.  Concerns regarding medicines are outlined above.  Medication changes, Labs and Tests ordered today are listed in the Patient Instructions below. Patient Instructions  Medication Instructions: Dr Royann Shivers recommends that you continue on your current medications as directed. Please refer to the Current Medication list given to you today.  Labwork: Your physician recommends that you return for lab work TODAY.  Testing/Procedures: NONE ORDERED  Follow-up: Your physician recommends that you schedule a follow-up appointment in 3 months with Dr Diona Browner in [place].  Your physician recommends that you schedule a follow-up appointment in 3 months with Dr Ladona Ridgel with a pacemaker check in [place].  If you need a refill on your cardiac medications before your next appointment, please call your pharmacy.    Signed, Thurmon Fair, MD  09/03/2017 8:02 PM    [facility] [address] [place], [place], [place]  [address] Phone: [phone]; Fax: [phone]   ADDENDUM: TSH level was normal on labs checked today.  Electrolytes and liver  function tests were also normal.  Creatinine was at her  baseline.

## 2017-09-12 ENCOUNTER — Other Ambulatory Visit: Payer: Self-pay | Admitting: Cardiovascular Disease

## 2017-09-15 ENCOUNTER — Other Ambulatory Visit: Payer: Self-pay | Admitting: Cardiovascular Disease

## 2017-09-16 NOTE — Telephone Encounter (Signed)
Rx request sent to pharmacy.  

## 2017-09-20 DIAGNOSIS — J449 Chronic obstructive pulmonary disease, unspecified: Secondary | ICD-10-CM | POA: Diagnosis not present

## 2017-09-20 DIAGNOSIS — Z96659 Presence of unspecified artificial knee joint: Secondary | ICD-10-CM | POA: Diagnosis not present

## 2017-09-20 DIAGNOSIS — I5040 Unspecified combined systolic (congestive) and diastolic (congestive) heart failure: Secondary | ICD-10-CM | POA: Diagnosis not present

## 2017-09-20 DIAGNOSIS — R269 Unspecified abnormalities of gait and mobility: Secondary | ICD-10-CM | POA: Diagnosis not present

## 2017-09-20 DIAGNOSIS — I5042 Chronic combined systolic (congestive) and diastolic (congestive) heart failure: Secondary | ICD-10-CM | POA: Diagnosis not present

## 2017-09-28 ENCOUNTER — Telehealth: Payer: Self-pay

## 2017-09-28 NOTE — Telephone Encounter (Signed)
Returned call to daughter as requested by voice mail message regarding med refills and review of transmission that was sent on 09/27/2017.  She reported patient slept a lot on Saturday and felt like she could not get enough air and wanted oxygen increased.  She has had difficulty getting script for Amiodarone and Furosemide refilled due to pharmacy was calling hospitalist for refills. Her daughter spoke with someone yesterday and the refills should be completed today.  She gave patient Metolazone yesterday and patient feels better today.  Advised reviewed transmission and it suggested fluid accumulation started on Saturday. She thinks everything is fine now and no further needs today.  Advised to call Dr Croitoru's office directly for future refills.

## 2017-10-02 ENCOUNTER — Telehealth: Payer: Self-pay | Admitting: Cardiovascular Disease

## 2017-10-02 NOTE — Telephone Encounter (Signed)
Spoke with pt's daughter [name]. She sts that the pt has been stable from a cardiac standpoint.  She has progressively become generally weaker over the last couple of weeks. [name] sts that she is over whelmed and is not sure what she needs to do.  Maryclare Labrador to contact the pt's pcp. They may be able to initiate home health assistance.  [name] verbalized understanding and voiced appreciation for the assistance.

## 2017-10-02 NOTE — Telephone Encounter (Signed)
New Message   Pt's daughter is calling, states that the pt is extremely weak, that she cant even get out of bed. Wants to know some recommendations to get her strength up and states her blood pressure, heart rate and oxygen are fine. Please call

## 2017-10-09 ENCOUNTER — Encounter (HOSPITAL_COMMUNITY): Payer: Self-pay | Admitting: Emergency Medicine

## 2017-10-09 ENCOUNTER — Emergency Department (HOSPITAL_COMMUNITY)
Admission: EM | Admit: 2017-10-09 | Discharge: 2017-10-09 | Disposition: A | Discharge status: Home / Self Care | Payer: PPO | Attending: Emergency Medicine | Admitting: Emergency Medicine

## 2017-10-09 ENCOUNTER — Emergency Department (HOSPITAL_COMMUNITY): Payer: PPO

## 2017-10-09 ENCOUNTER — Other Ambulatory Visit: Payer: Self-pay

## 2017-10-09 DIAGNOSIS — Z950 Presence of cardiac pacemaker: Secondary | ICD-10-CM | POA: Insufficient documentation

## 2017-10-09 DIAGNOSIS — R0602 Shortness of breath: Secondary | ICD-10-CM | POA: Insufficient documentation

## 2017-10-09 DIAGNOSIS — N183 Chronic kidney disease, stage 3 (moderate): Secondary | ICD-10-CM | POA: Diagnosis not present

## 2017-10-09 DIAGNOSIS — Z7901 Long term (current) use of anticoagulants: Secondary | ICD-10-CM | POA: Insufficient documentation

## 2017-10-09 DIAGNOSIS — E1122 Type 2 diabetes mellitus with diabetic chronic kidney disease: Secondary | ICD-10-CM | POA: Insufficient documentation

## 2017-10-09 DIAGNOSIS — I130 Hypertensive heart and chronic kidney disease with heart failure and stage 1 through stage 4 chronic kidney disease, or unspecified chronic kidney disease: Secondary | ICD-10-CM | POA: Diagnosis not present

## 2017-10-09 DIAGNOSIS — I2510 Atherosclerotic heart disease of native coronary artery without angina pectoris: Secondary | ICD-10-CM | POA: Insufficient documentation

## 2017-10-09 DIAGNOSIS — R531 Weakness: Secondary | ICD-10-CM | POA: Insufficient documentation

## 2017-10-09 DIAGNOSIS — I959 Hypotension, unspecified: Secondary | ICD-10-CM | POA: Diagnosis not present

## 2017-10-09 DIAGNOSIS — J449 Chronic obstructive pulmonary disease, unspecified: Secondary | ICD-10-CM | POA: Insufficient documentation

## 2017-10-09 DIAGNOSIS — I5042 Chronic combined systolic (congestive) and diastolic (congestive) heart failure: Secondary | ICD-10-CM | POA: Diagnosis not present

## 2017-10-09 DIAGNOSIS — Z79899 Other long term (current) drug therapy: Secondary | ICD-10-CM | POA: Diagnosis not present

## 2017-10-09 DIAGNOSIS — R0600 Dyspnea, unspecified: Secondary | ICD-10-CM | POA: Diagnosis not present

## 2017-10-09 DIAGNOSIS — J479 Bronchiectasis, uncomplicated: Secondary | ICD-10-CM | POA: Diagnosis not present

## 2017-10-09 DIAGNOSIS — R109 Unspecified abdominal pain: Secondary | ICD-10-CM | POA: Diagnosis not present

## 2017-10-09 DIAGNOSIS — I4891 Unspecified atrial fibrillation: Secondary | ICD-10-CM | POA: Diagnosis not present

## 2017-10-09 LAB — CBC
HCT: 32.6 % — ABNORMAL LOW (ref 36.0–46.0)
Hemoglobin: 10.6 g/dL — ABNORMAL LOW (ref 12.0–15.0)
MCH: 33.8 pg (ref 26.0–34.0)
MCHC: 32.5 g/dL (ref 30.0–36.0)
MCV: 103.8 fL — ABNORMAL HIGH (ref 78.0–100.0)
PLATELETS: 284 10*3/uL (ref 150–400)
RBC: 3.14 MIL/uL — AB (ref 3.87–5.11)
RDW: 12.5 % (ref 11.5–15.5)
WBC: 7.8 10*3/uL (ref 4.0–10.5)

## 2017-10-09 LAB — HEPATIC FUNCTION PANEL
ALBUMIN: 3.1 g/dL — AB (ref 3.5–5.0)
ALK PHOS: 111 U/L (ref 38–126)
ALT: 15 U/L (ref 0–44)
AST: 19 U/L (ref 15–41)
Bilirubin, Direct: 0.1 mg/dL (ref 0.0–0.2)
Indirect Bilirubin: 0.5 mg/dL (ref 0.3–0.9)
TOTAL PROTEIN: 6.8 g/dL (ref 6.5–8.1)
Total Bilirubin: 0.6 mg/dL (ref 0.3–1.2)

## 2017-10-09 LAB — BASIC METABOLIC PANEL
Anion gap: 8 (ref 5–15)
BUN: 28 mg/dL — ABNORMAL HIGH (ref 8–23)
CO2: 35 mmol/L — ABNORMAL HIGH (ref 22–32)
CREATININE: 1.28 mg/dL — AB (ref 0.44–1.00)
Calcium: 9 mg/dL (ref 8.9–10.3)
Chloride: 92 mmol/L — ABNORMAL LOW (ref 98–111)
GFR, EST AFRICAN AMERICAN: 41 mL/min — AB (ref >60)
GFR, EST NON AFRICAN AMERICAN: 36 mL/min — AB (ref >60)
Glucose, Bld: 161 mg/dL — ABNORMAL HIGH (ref 70–99)
Potassium: 3.6 mmol/L (ref 3.5–5.1)
SODIUM: 135 mmol/L (ref 135–145)

## 2017-10-09 LAB — CBG MONITORING, ED: Glucose-Capillary: 147 mg/dL — ABNORMAL HIGH (ref 70–99)

## 2017-10-09 LAB — LIPASE, BLOOD: LIPASE: 39 U/L (ref 11–51)

## 2017-10-09 LAB — URINALYSIS, ROUTINE W REFLEX MICROSCOPIC
Bilirubin Urine: NEGATIVE
Glucose, UA: NEGATIVE mg/dL
Hgb urine dipstick: NEGATIVE
KETONES UR: NEGATIVE mg/dL
LEUKOCYTES UA: NEGATIVE
NITRITE: NEGATIVE
Protein, ur: NEGATIVE mg/dL
Specific Gravity, Urine: 1.01 (ref 1.005–1.030)
pH: 7 (ref 5.0–8.0)

## 2017-10-09 LAB — TROPONIN I

## 2017-10-09 LAB — BRAIN NATRIURETIC PEPTIDE: B Natriuretic Peptide: 767 pg/mL — ABNORMAL HIGH (ref 0.0–100.0)

## 2017-10-09 NOTE — ED Triage Notes (Signed)
Per EMS, pt from home. C/o increased in generalized weakness worsening daily for the past week. Pt began having urinary incontinence that began last night.

## 2017-10-09 NOTE — Clinical Social Work Note (Signed)
Notified by ED RN that CSW was consult for help at home needs. RN CM will follow up on this request. Will clear CSW consult.

## 2017-10-09 NOTE — Discharge Instructions (Addendum)
Work-up here for the shortness of breath and not feeling well without any significant findings.  Chest x-ray raise some concerns for pneumonia so we did CT chest showed no evidence of pneumonia.  There was an area some pleural thickening that will need a repeat x-ray in about 3 months.  Her regular doctor can follow this up.  Patient's oxygen levels here on her 2 L that she can do at home have been fine.  No evidence for urinary tract infection no significant lab abnormalities.  In addition the social worker said some things up for a home visit for you to help you out with taking care of her at home.

## 2017-10-09 NOTE — Care Management Note (Addendum)
Case Management Note  Patient Details  Name: XAYLA PUZIO MRN: [id number] Date of Birth: 1924-06-21  Subjective/Objective:   CM consulted for help at home. Met with patient's daughter at bedside. Patient in Xray. Patient lives with Daughter, [name]. [name] reports patient is getting weaker. We discuss options available. We discuss Medicaid, LTC and home health. [name] isn't sure that her mom will qualify for medicaid. [name] reports that she really needs someone to sit with her mom a few hours a day. Reports she has been paying her niece to help out with "chores" and "transfers" but that is it. We discuss home health SW to help assist with needs/resources at home and RN for f/u.   Has had [place] in the past.    Patient has RW, WC and O2.        Action/Plan: Vaughan Basta of [facility] notified and will obtain orders via Epic.   Expected Discharge Date:      10/09/2017            Expected Discharge Plan:  [place]  In-House Referral:     Discharge planning Services  CM Consult  Post Acute Care Choice:  Home Health Choice offered to:  Patient, Adult Children  DME Arranged:    DME Agency:     HH Arranged:  RN, Social Work [employer] Agency:  [place]  Status of Service:  Completed, signed off  If discussed at [employer], dates discussed:    Additional Comments:  Caysen Whang, Chauncey Reading, RN 10/09/2017, 3:01 PM

## 2017-10-09 NOTE — ED Provider Notes (Signed)
[facility] EMERGENCY DEPARTMENT Provider Note   CSN: [id number] Arrival date & time: 10/09/17  1203     History   Chief Complaint Chief Complaint  Patient presents with  . Weakness    HPI Kathleen [name] is a 84 y.o. female.  Patient brought in by EMS.  But accompanied by family member.  Patient lives at home with family member.  Patient was sort of generalized just not feeling well complaints generalized weakness getting worse for the past week.  Occasionally not feeling as if she cannot breathe well.  Patient does have the availability be on oxygen at home.  On 2 L of oxygen her oxygen saturations are in the 90s.  Without any problems.  Blood pressure was 143/71.  She was afebrile.  Patient also states that she feels like she has not had a bowel movement for a while.  Patient does take MiraLAX at home.  Denied any real chest pain any significant abdominal pain no nausea or vomiting no diarrhea     Past Medical History:  Diagnosis Date  . Acid reflux   . AV block, 2nd degree    a. s/p Medtronic PPM placement in 2013 with Bi-V upgrade and CRT  . CAD (coronary artery disease)   . Chronic kidney disease (CKD), stage III (moderate) (HCC)   . Essential hypertension   . History of pneumonia   . Hyperlipidemia   . Macular degeneration   . Nonischemic cardiomyopathy (HCC)    a. EF 40-45% by echo in 2017 b. reduced to 25-30% by repeat echo in 03/2017  . PAF (paroxysmal atrial fibrillation) (HCC)    a. on Eliquis and Amiodarone  . Type 2 diabetes mellitus [facility])     Patient Active Problem List   Diagnosis Date Noted  . Atrial fibrillation, chronic (HCC) 04/13/2017  . Failure to thrive in adult 04/13/2017  . CHF (congestive heart failure) (HCC) 04/11/2017  . Acute on chronic combined systolic (congestive) and diastolic (congestive) heart failure (HCC) 08/21/2016  . Acute combined systolic and diastolic CHF, NYHA class 1 (HCC) 08/21/2016  . Persistent atrial fibrillation (HCC)    . Encounter for monitoring amiodarone therapy 06/08/2016  . Right hip pain   . Pressure injury of skin 11/19/2015  . Hematoma of hip, right, initial encounter 11/18/2015  . Abdominal distension   . Malnutrition of moderate degree 11/02/2015  . Closed displaced intertrochanteric fracture of right femur (HCC)   . Hip fracture (HCC) 11/01/2015  . Acute on chronic combined systolic and diastolic CHF, NYHA class 1 (HCC) 05/02/2015  . Pain in the chest   . Chest pain 04/30/2015  . Pelvis fracture, closed, initial encounter 02/19/2015  . Distal radial epiphysitis 02/19/2015  . Hypercortisolemia (HCC) 10/03/2014  . UTI (urinary tract infection) 10/05/2013  . Atrial fibrillation with RVR (HCC) 10/04/2013  . Generalized weakness 10/04/2013  . Chronic combined systolic and diastolic CHF (congestive heart failure) (HCC) 10/20/2012  . CKD (chronic kidney disease) stage 3, GFR 30-59 ml/min (HCC) 10/20/2012  . Cardiomyopathy, dilated, nonischemic 10/14/2011  . CRT - pacemaker Medtronic 10/14/2011  . Hypokalemia 10/12/2011  . Acute renal insufficiency, Scr was WNL in April 2013 10/11/2011  . Anemia 10/11/2011  . Unstable angina (HCC) 10/11/2011  . Gait abnormality 05/13/2011  . Syncope 03/25/2011  . COPD on CXR 03/25/2011  . Coronary atherosclerosis, no significant obstructive lesions 03/25/2011  . Symptomatic advanced heart block, 2:1 AV block, RT BBB, Lt post. fas. block.(March 2013) 03/23/2011  . Paroxysmal  atrial fibrillation (HCC) 03/23/2011  . Lung nodule 12/13/2010  . DM type 2 (diabetes mellitus, type 2) (HCC) 12/13/2010  . Hyperlipidemia 12/13/2010  . TOTAL KNEE FOLLOW-UP 06/20/2009  . MONONEURITIS, LEG 02/19/2009  . KNEE, ARTHRITIS, DEGEN./OSTEO 11/30/2008  . SPINAL STENOSIS 07/04/2008  . HEMARTHROSIS, LOWER LEG 05/10/2008  . Effusion of lower leg joint 05/03/2008  . KNEE PAIN 05/03/2008  . Hypertension 05/03/2008    Past Surgical History:  Procedure Laterality Date  .  Bladder tack  1970's  . CARDIAC CATHETERIZATION  10/15/2011   Normal coronaries  . CARDIOVERSION N/A 10/28/2013   Procedure: CARDIOVERSION;  Surgeon: Thurmon Fair, MD;  Location: [facility] ENDOSCOPY;  Service: Cardiovascular;  Laterality: N/A;  . CARDIOVERSION N/A 08/20/2016   Procedure: CARDIOVERSION;  Surgeon: Thurmon Fair, MD;  Location: [facility] ENDOSCOPY;  Service: Cardiovascular;  Laterality: N/A;  . CHOLECYSTECTOMY  1999  . INTRAMEDULLARY (IM) NAIL INTERTROCHANTERIC Right 11/03/2015   Procedure: INTRAMEDULLARY (IM) NAIL RIGHT INTERTROCHANTERIC HIP FRACTURE;  Surgeon: Tarry Kos, MD;  Location: [facility] OR;  Service: Orthopedics;  Laterality: Right;  . LEFT AND RIGHT HEART CATHETERIZATION WITH CORONARY ANGIOGRAM N/A 10/15/2011   Procedure: LEFT AND RIGHT HEART CATHETERIZATION WITH CORONARY ANGIOGRAM;  Surgeon: Chrystie Nose, MD;  Location: [facility] CATH LAB;  Service: Cardiovascular;  Laterality: N/A;  . NM MYOCAR PERF WALL MOTION  03/15/2009   Normal  . PACEMAKER INSERTION  10/16/2011   Medtronic  . PERMANENT PACEMAKER INSERTION N/A 03/24/2011   Procedure: PERMANENT PACEMAKER INSERTION;  Surgeon: Thurmon Fair, MD;  Location: [facility] CATH LAB;  Service: Cardiovascular;  Laterality: N/A;  . REPLACEMENT TOTAL KNEE       OB History   None      Home Medications    Prior to Admission medications   Medication Sig Start Date End Date Taking? Authorizing Provider  acetaminophen (TYLENOL) 325 MG tablet Take 650 mg by mouth every 4 (four) hours as needed for mild pain or moderate pain.     [provider]  amiodarone (PACERONE) 200 MG tablet Take 1 tablet (200 mg total) by mouth daily. 04/18/17   Henderson Cloud, MD  AMITIZA 24 MCG capsule TAKE (1) CAPSULE BY MOUTH TWICE DAILY. 02/25/17   Setzer, Brand Males, NP  Coenzyme Q10 (CO Q 10 PO) Take 1 tablet by mouth daily.    [provider]  DM-Doxylamine-Acetaminophen (NYQUIL COLD & FLU PO) Take 1 capsule by mouth at bedtime as needed  (sleep).    [provider]  ELIQUIS 2.5 MG TABS tablet TAKE ONE TABLET BY MOUTH TWICE DAILY. 08/25/17   Croitoru, Mihai, MD  ENTRESTO 24-26 MG TAKE (1) TABLET BY MOUTH TWICE DAILY. 09/16/17   Croitoru, Mihai, MD  ferrous sulfate 325 (65 FE) MG tablet Take 325 mg by mouth daily with breakfast.     [provider]  furosemide (LASIX) 20 MG tablet Take 3 tablets (60 mg total) by mouth daily. 04/18/17   Philip Aspen, Limmie Patricia, MD  glimepiride (AMARYL) 1 MG tablet Take 0.5 mg by mouth daily as needed (high blood sugars). Take 0.5 tablet (0.5 mg total) by mouth daily if CBG is >120.    [provider]  metolazone (ZAROXOLYN) 2.5 MG tablet Take 1 tablet by mouth once or twice a week as needed. 04/27/17   Croitoru, Mihai, MD  metoprolol succinate (TOPROL-XL) 25 MG 24 hr tablet Take 3 tablets (75 mg total) by mouth 2 (two) times daily. 09/15/17   Croitoru, Rachelle Hora, MD  nitroGLYCERIN (  NITROSTAT) 0.4 MG SL tablet DISSOLVE 1 TABLET UNDER TONGUE EVERY 5 MINUTES UP TO 15 MIN FOR CHESTPAIN. IF NO RELIEF CALL 911. 12/25/14   Croitoru, Mihai, MD  ondansetron (ZOFRAN) 4 MG tablet Take 4 mg by mouth every 4 (four) hours as needed for nausea or vomiting.    [provider]  potassium chloride SA (K-DUR,KLOR-CON) 20 MEQ tablet Take 1 capsule (20 mEq) daily. Take an extra 1 capsule (20 mEq) on days you take an extra lasix. 08/22/16   Duke, Roe Rutherford, PA  pravastatin (PRAVACHOL) 40 MG tablet TAKE 1 TABLET BY MOUTH EACH EVENING. 02/16/17   Croitoru, Mihai, MD  benazepril (LOTENSIN) 40 MG tablet Take 40 mg by mouth 2 (two) times daily.  05/27/17  [provider]    Family History Family History  Problem Relation Age of Onset  . Suicidality Mother   . Heart murmur Daughter   . Heart attack Brother     Social History Social History   Tobacco Use  . Smoking status: Never Smoker  . Smokeless tobacco: Never Used  Substance Use Topics  . Alcohol use: No  . Drug use: No      Allergies   Coumadin [warfarin sodium]; Meloxicam; Metformin and related; Morphine; Nabumetone; Oxycodone; and Sulfonamide derivatives   Review of Systems Review of Systems  Constitutional: Positive for fatigue. Negative for fever.  HENT: Positive for hearing loss. Negative for congestion.   Eyes: Negative for redness.  Respiratory: Positive for shortness of breath.   Cardiovascular: Negative for chest pain.  Gastrointestinal: Negative for abdominal pain.  Genitourinary: Negative for dysuria.  Musculoskeletal: Negative for back pain.  Skin: Negative for rash.  Neurological: Positive for weakness.  Hematological: Does not bruise/bleed easily.  Psychiatric/Behavioral: Negative for confusion.     Physical Exam Updated Vital Signs BP 134/61   Pulse 69   Temp 97.9 F (36.6 C) (Oral)   Resp (!) 22   Ht 1.727 m (5\' 8" )   Wt 59 kg   SpO2 98%   BMI 19.77 kg/m   Physical Exam  Constitutional: She appears well-developed and well-nourished. No distress.  HENT:  Head: Normocephalic and atraumatic.  Mucous membranes slightly dry  Eyes: Pupils are equal, round, and reactive to light. Conjunctivae and EOM are normal.  Neck: Neck supple.  Cardiovascular: Normal rate, regular rhythm and intact distal pulses.  Pulmonary/Chest: Effort normal and breath sounds normal. No respiratory distress. She has no wheezes. She has no rales. She exhibits no tenderness.  Abdominal: Soft. Bowel sounds are normal. She exhibits no distension. There is no tenderness.  Musculoskeletal: Normal range of motion. She exhibits no edema.  Neurological: She is alert. No cranial nerve deficit or sensory deficit. She exhibits normal muscle tone. Coordination normal.  Skin: Skin is warm. No rash noted.  Nursing note and vitals reviewed.    ED Treatments / Results  Labs (all labs ordered are listed, but only abnormal results are displayed) Labs Reviewed  BASIC METABOLIC PANEL - Abnormal; Notable for the  following components:      Result Value   Chloride 92 (*)    CO2 35 (*)    Glucose, Bld 161 (*)    BUN 28 (*)    Creatinine, Ser 1.28 (*)    GFR calc non Af Amer 36 (*)    GFR calc Af Amer 41 (*)    All other components within normal limits  CBC - Abnormal; Notable for the following components:   RBC 3.14 (*)  Hemoglobin 10.6 (*)    HCT 32.6 (*)    MCV 103.8 (*)    All other components within normal limits  HEPATIC FUNCTION PANEL - Abnormal; Notable for the following components:   Albumin 3.1 (*)    All other components within normal limits  BRAIN NATRIURETIC PEPTIDE - Abnormal; Notable for the following components:   B Natriuretic Peptide 767.0 (*)    All other components within normal limits  CBG MONITORING, ED - Abnormal; Notable for the following components:   Glucose-Capillary 147 (*)    All other components within normal limits  URINALYSIS, ROUTINE W REFLEX MICROSCOPIC  LIPASE, BLOOD  TROPONIN I    EKG EKG Interpretation  Date/Time:  Friday October 09 2017 12:13:01 EDT Ventricular Rate:  73 PR Interval:    QRS Duration: 164 QT Interval:  502 QTC Calculation: 554 R Axis:   -90 Text Interpretation:  Atrial-ventricular dual-paced rhythm No further analysis attempted due to paced rhythm Confirmed by Vanetta Mulders [phone]) on 10/09/2017 12:25:01 PM   Radiology Dg Abd Acute W/chest  Result Date: 10/09/2017 CLINICAL DATA:  Abdominal pain and dyspnea with generalized weakness EXAM: DG ABDOMEN ACUTE W/ 1V CHEST COMPARISON:  04/11/2017 FINDINGS: Stable cardiomegaly with aortic atherosclerosis. Right atrial and right ventricular leads with left-sided pacemaker apparatus. No significant change. Pulmonary hyperinflation with diffuse fine interstitial lung markings, chronic in appearance likely reflective of chronic interstitial lung disease. Slightly more confluent opacity in the right mid lung may reflect an area of atelectasis or pneumonia. This can obscure underlying  lesions. Increased fecal retention within the colon. Nonobstructed bowel gas pattern. No free intraperitoneal air. Osteopenic appearance of the dorsal spine with spondylosis. Right femoral nail fixation of the femur, partially included. Remote fracture of the right inferior pubic ramus. Osteoarthritis of the SI joints. IMPRESSION: 1. Increased fecal retention within the colon consistent with constipation. No bowel obstruction or free air. 2. Emphysematous hyperinflation lungs chronic interstitial lung disease characterized by fine reticular interstitial markings. Slightly confluent opacity in the right mid lung may reflect area of atelectasis or pneumonia. 3. Thoracolumbar spondylosis. Electronically Signed   By: Tollie Eth M.D.   On: 10/09/2017 15:02    Procedures Procedures (including critical care time)  Medications Ordered in ED Medications - No data to display   Initial Impression / Assessment and Plan / ED Course  I have reviewed the triage vital signs and the nursing notes.  Pertinent labs & imaging results that were available during my care of the patient were reviewed by me and considered in my medical decision making (see chart for details).     Work-up here without any acute findings.  Chest x-ray did raise some question about right middle lobe pneumonia so patient had CT scan of the chest done.  Did not crossover.  This showed no evidence of pneumonia just showed some pleural thickening that will need follow-up in 3 months.  Patient abdominal series did show some increased stool burden but no signs of any bowel obstruction or free air.  This correlates with patient's abdomen being soft and nontender.  Patient's oxygen levels have remained fine.  Patient is to improved here with some of the fluids.  She has MiraLAX to take at home for the constipation.  We had social worker interview them and is going to set up some additional home resources for them.  Patient stable for discharge back  home.  Patient feels better and wants to go home.  In addition no evidence  of a urinary tract infection.  No evidence of any silent MI.  BNP was elevated however CT chest showed no evidence of any significant pulmonary edema.  Patient's renal function although unchanged precluded any IV dye with a CT of the chest.  So was done without.  Support ruled out based on patient's oxygen saturations here on her 2 L of oxygen that she is normally on at home no real clinical concerns there.  Final Clinical Impressions(s) / ED Diagnoses   Final diagnoses:  Weakness  SOB (shortness of breath) on exertion    ED Discharge Orders    None       Vanetta Mulders, MD 10/09/17 2219

## 2017-10-09 NOTE — ED Notes (Signed)
Received report on pt, pt lying in bed, family at bedside, update given, pt requesting something to eat and drink, pt and family advised that pt is not able to eat until xrays are finished,

## 2017-10-12 DIAGNOSIS — R5383 Other fatigue: Secondary | ICD-10-CM | POA: Diagnosis not present

## 2017-10-12 DIAGNOSIS — I509 Heart failure, unspecified: Secondary | ICD-10-CM | POA: Diagnosis not present

## 2017-10-12 DIAGNOSIS — Z950 Presence of cardiac pacemaker: Secondary | ICD-10-CM | POA: Diagnosis not present

## 2017-10-12 DIAGNOSIS — Z7901 Long term (current) use of anticoagulants: Secondary | ICD-10-CM | POA: Diagnosis not present

## 2017-10-12 DIAGNOSIS — Z9981 Dependence on supplemental oxygen: Secondary | ICD-10-CM | POA: Diagnosis not present

## 2017-10-12 DIAGNOSIS — E1151 Type 2 diabetes mellitus with diabetic peripheral angiopathy without gangrene: Secondary | ICD-10-CM | POA: Diagnosis not present

## 2017-10-12 DIAGNOSIS — K219 Gastro-esophageal reflux disease without esophagitis: Secondary | ICD-10-CM | POA: Diagnosis not present

## 2017-10-12 DIAGNOSIS — E1122 Type 2 diabetes mellitus with diabetic chronic kidney disease: Secondary | ICD-10-CM | POA: Diagnosis not present

## 2017-10-12 DIAGNOSIS — Z7984 Long term (current) use of oral hypoglycemic drugs: Secondary | ICD-10-CM | POA: Diagnosis not present

## 2017-10-12 DIAGNOSIS — N183 Chronic kidney disease, stage 3 (moderate): Secondary | ICD-10-CM | POA: Diagnosis not present

## 2017-10-12 DIAGNOSIS — E785 Hyperlipidemia, unspecified: Secondary | ICD-10-CM | POA: Diagnosis not present

## 2017-10-12 DIAGNOSIS — I482 Chronic atrial fibrillation, unspecified: Secondary | ICD-10-CM | POA: Diagnosis not present

## 2017-10-12 DIAGNOSIS — I5043 Acute on chronic combined systolic (congestive) and diastolic (congestive) heart failure: Secondary | ICD-10-CM | POA: Diagnosis not present

## 2017-10-12 DIAGNOSIS — I2510 Atherosclerotic heart disease of native coronary artery without angina pectoris: Secondary | ICD-10-CM | POA: Diagnosis not present

## 2017-10-12 DIAGNOSIS — Z681 Body mass index (BMI) 19 or less, adult: Secondary | ICD-10-CM | POA: Diagnosis not present

## 2017-10-12 DIAGNOSIS — E11319 Type 2 diabetes mellitus with unspecified diabetic retinopathy without macular edema: Secondary | ICD-10-CM | POA: Diagnosis not present

## 2017-10-12 DIAGNOSIS — I129 Hypertensive chronic kidney disease with stage 1 through stage 4 chronic kidney disease, or unspecified chronic kidney disease: Secondary | ICD-10-CM | POA: Diagnosis not present

## 2017-10-14 ENCOUNTER — Other Ambulatory Visit (HOSPITAL_COMMUNITY)
Admission: AD | Admit: 2017-10-14 | Discharge: 2017-10-14 | Disposition: A | Discharge status: Home / Self Care | Payer: PPO | Source: Skilled Nursing Facility | Attending: Internal Medicine | Admitting: Internal Medicine

## 2017-10-14 DIAGNOSIS — I482 Chronic atrial fibrillation, unspecified: Secondary | ICD-10-CM | POA: Diagnosis not present

## 2017-10-14 DIAGNOSIS — Z7984 Long term (current) use of oral hypoglycemic drugs: Secondary | ICD-10-CM | POA: Diagnosis not present

## 2017-10-14 DIAGNOSIS — Z029 Encounter for administrative examinations, unspecified: Secondary | ICD-10-CM | POA: Insufficient documentation

## 2017-10-14 DIAGNOSIS — I129 Hypertensive chronic kidney disease with stage 1 through stage 4 chronic kidney disease, or unspecified chronic kidney disease: Secondary | ICD-10-CM | POA: Diagnosis not present

## 2017-10-14 DIAGNOSIS — I5043 Acute on chronic combined systolic (congestive) and diastolic (congestive) heart failure: Secondary | ICD-10-CM | POA: Diagnosis not present

## 2017-10-14 DIAGNOSIS — Z950 Presence of cardiac pacemaker: Secondary | ICD-10-CM | POA: Diagnosis not present

## 2017-10-14 DIAGNOSIS — N183 Chronic kidney disease, stage 3 (moderate): Secondary | ICD-10-CM | POA: Diagnosis not present

## 2017-10-14 DIAGNOSIS — E11319 Type 2 diabetes mellitus with unspecified diabetic retinopathy without macular edema: Secondary | ICD-10-CM | POA: Diagnosis not present

## 2017-10-14 DIAGNOSIS — E785 Hyperlipidemia, unspecified: Secondary | ICD-10-CM | POA: Diagnosis not present

## 2017-10-14 DIAGNOSIS — Z9981 Dependence on supplemental oxygen: Secondary | ICD-10-CM | POA: Diagnosis not present

## 2017-10-14 DIAGNOSIS — E1151 Type 2 diabetes mellitus with diabetic peripheral angiopathy without gangrene: Secondary | ICD-10-CM | POA: Diagnosis not present

## 2017-10-14 DIAGNOSIS — Z7901 Long term (current) use of anticoagulants: Secondary | ICD-10-CM | POA: Diagnosis not present

## 2017-10-14 DIAGNOSIS — K219 Gastro-esophageal reflux disease without esophagitis: Secondary | ICD-10-CM | POA: Diagnosis not present

## 2017-10-14 DIAGNOSIS — E1122 Type 2 diabetes mellitus with diabetic chronic kidney disease: Secondary | ICD-10-CM | POA: Diagnosis not present

## 2017-10-14 DIAGNOSIS — I2510 Atherosclerotic heart disease of native coronary artery without angina pectoris: Secondary | ICD-10-CM | POA: Diagnosis not present

## 2017-10-14 LAB — URINALYSIS, ROUTINE W REFLEX MICROSCOPIC
Bilirubin Urine: NEGATIVE
Glucose, UA: NEGATIVE mg/dL
Hgb urine dipstick: NEGATIVE
Ketones, ur: NEGATIVE mg/dL
Leukocytes, UA: NEGATIVE
Nitrite: NEGATIVE
PH: 6 (ref 5.0–8.0)
Protein, ur: NEGATIVE mg/dL
Specific Gravity, Urine: 1.01 (ref 1.005–1.030)

## 2017-10-16 LAB — URINE CULTURE: Culture: NO GROWTH

## 2017-10-16 NOTE — Progress Notes (Signed)
No ICM remote transmission received for 10/05/2017 and next ICM transmission scheduled for 10/29/2017.    

## 2017-10-19 ENCOUNTER — Other Ambulatory Visit: Payer: Self-pay | Admitting: Cardiovascular Disease

## 2017-10-19 NOTE — Telephone Encounter (Signed)
Rx request sent to pharmacy.  

## 2017-10-20 DIAGNOSIS — R269 Unspecified abnormalities of gait and mobility: Secondary | ICD-10-CM | POA: Diagnosis not present

## 2017-10-20 DIAGNOSIS — I5040 Unspecified combined systolic (congestive) and diastolic (congestive) heart failure: Secondary | ICD-10-CM | POA: Diagnosis not present

## 2017-10-20 DIAGNOSIS — I5042 Chronic combined systolic (congestive) and diastolic (congestive) heart failure: Secondary | ICD-10-CM | POA: Diagnosis not present

## 2017-10-20 DIAGNOSIS — Z96659 Presence of unspecified artificial knee joint: Secondary | ICD-10-CM | POA: Diagnosis not present

## 2017-10-20 DIAGNOSIS — J449 Chronic obstructive pulmonary disease, unspecified: Secondary | ICD-10-CM | POA: Diagnosis not present

## 2017-10-26 ENCOUNTER — Other Ambulatory Visit: Payer: Self-pay | Admitting: Cardiovascular Disease

## 2017-10-26 DIAGNOSIS — I5043 Acute on chronic combined systolic (congestive) and diastolic (congestive) heart failure: Secondary | ICD-10-CM | POA: Diagnosis not present

## 2017-10-26 DIAGNOSIS — I129 Hypertensive chronic kidney disease with stage 1 through stage 4 chronic kidney disease, or unspecified chronic kidney disease: Secondary | ICD-10-CM | POA: Diagnosis not present

## 2017-10-26 DIAGNOSIS — E1122 Type 2 diabetes mellitus with diabetic chronic kidney disease: Secondary | ICD-10-CM | POA: Diagnosis not present

## 2017-10-26 DIAGNOSIS — I2510 Atherosclerotic heart disease of native coronary artery without angina pectoris: Secondary | ICD-10-CM | POA: Diagnosis not present

## 2017-10-29 ENCOUNTER — Telehealth: Payer: Self-pay | Admitting: Cardiology

## 2017-10-29 ENCOUNTER — Ambulatory Visit (INDEPENDENT_AMBULATORY_CARE_PROVIDER_SITE_OTHER): Payer: PPO

## 2017-10-29 DIAGNOSIS — I5042 Chronic combined systolic (congestive) and diastolic (congestive) heart failure: Secondary | ICD-10-CM

## 2017-10-29 DIAGNOSIS — Z950 Presence of cardiac pacemaker: Secondary | ICD-10-CM

## 2017-10-29 NOTE — Telephone Encounter (Signed)
LMOVM reminding pt to send remote transmission.   

## 2017-10-30 NOTE — Progress Notes (Signed)
EPIC Encounter for ICM Monitoring  Patient Name: Kathleen [name] is a 84 y.o. female Date: 10/30/2017 Primary Care Physican: Redmond School, MD Primary Cardiologist:Croitoru Electrophysiologist: Croitoru Dry Weight:107-109 lbs Bi-V Pacing: 100%   Clinical Status (27-Sep-2017 to 29-Oct-2017) AT/AF 1 episode  Time in AT/AF <0.1 hr/day (0.4%) Longest episode 3 hours - appeared EGM showed episode 10/28/2017                                         Spoke with daughter [name].  Heart Failure questions reviewed and patient had a weak spell on 10/17/2017 and BP was low 106/54 and generally did not feel well that day.     Thoracic impedance normal.   Prescribed: Furosemide20Take3 tablets (60 mg total)daily. Potassium 20 mEqTake 1 capsule (20 mEq) daily. Take an extra 1 capsule (20 mEq) on days you take an extra lasix. Metolazone 2.5 mg 1 tablet twice a week as needed. She is also taking extra Potassium when taking Metolazone.  Labs: 05/27/2017 Creatinine 1.11, BUN 35, Potassium 3.8, Sodium 132, EGFR 42-49 04/17/2017 Creatinine1.00, BUN41, Potassium4.4, Sodium132, [id number] 04/16/2017 Creatinine1.11, BUN46, Potassium4.5, Sodium129, [id number]  04/15/2017 Creatinine1.39, BUN40, Potassium4.6, [id number], [id number]  04/14/2017 Creatinine1.28, BUN40, Potassium4.9, [id number], [id number]  04/13/2017 Creatinine1.24, BUN35, Potassium4.9, Sodium130, [id number]  04/12/2017 Creatinine1.32, BUN35, Potassium4.2, OUZHQU047, [id number]  04/11/2017 Creatinine1.42, BUN40, Potassium4.5, [id number], [id number]  03/24/2017 Creatinine1.42, BUN26, Potassium3.8, [id number], [id number]  Recommendations: No changes.   Encouraged to call for fluid symptoms.  Follow-up plan: ICM clinic phone appointment on 11/30/2017.    Office appointment with Dr Domenic Polite on 12/09/2017  Copy of ICM check sent to Dr. Sallyanne Kuster.   3 month ICM trend: 10/29/2017    1 Year ICM trend:        [name], RN 10/30/2017 2:39 PM

## 2017-10-30 NOTE — Progress Notes (Signed)
Thank you MCr 

## 2017-11-18 ENCOUNTER — Other Ambulatory Visit: Payer: Self-pay | Admitting: Cardiovascular Disease

## 2017-11-20 DIAGNOSIS — Z96659 Presence of unspecified artificial knee joint: Secondary | ICD-10-CM | POA: Diagnosis not present

## 2017-11-20 DIAGNOSIS — R269 Unspecified abnormalities of gait and mobility: Secondary | ICD-10-CM | POA: Diagnosis not present

## 2017-11-20 DIAGNOSIS — J449 Chronic obstructive pulmonary disease, unspecified: Secondary | ICD-10-CM | POA: Diagnosis not present

## 2017-11-20 DIAGNOSIS — I5040 Unspecified combined systolic (congestive) and diastolic (congestive) heart failure: Secondary | ICD-10-CM | POA: Diagnosis not present

## 2017-11-20 DIAGNOSIS — I5042 Chronic combined systolic (congestive) and diastolic (congestive) heart failure: Secondary | ICD-10-CM | POA: Diagnosis not present

## 2017-11-20 NOTE — Telephone Encounter (Signed)
Rx request sent to pharmacy.  

## 2017-11-30 ENCOUNTER — Telehealth: Payer: Self-pay

## 2017-11-30 ENCOUNTER — Ambulatory Visit (INDEPENDENT_AMBULATORY_CARE_PROVIDER_SITE_OTHER): Payer: PPO

## 2017-11-30 DIAGNOSIS — I5042 Chronic combined systolic (congestive) and diastolic (congestive) heart failure: Secondary | ICD-10-CM

## 2017-11-30 DIAGNOSIS — Z950 Presence of cardiac pacemaker: Secondary | ICD-10-CM | POA: Diagnosis not present

## 2017-11-30 NOTE — Telephone Encounter (Signed)
Confirmed remote transmission w/ pt daughter.   

## 2017-11-30 NOTE — Progress Notes (Signed)
EPIC Encounter for ICM Monitoring  Patient Name: Kathleen [name] is a 84 y.o. female Date: 11/30/2017 Primary Care Physican: Redmond School, MD Primary Cardiologist:Croitoru Electrophysiologist: Croitoru Dry Weight:unknown Bi-V Pacing: 100%  Clinical Status (29-Oct-2017 to 30-Nov-2017)  V. Pacing 100.0%   AT/AF 6 episodes   Time in AT/AF 0.6 hr/day (2.6%)   AT/AF Longest Episode 8 hr  Observations (2) (29-Oct-2017 to 30-Nov-2017)  2 days with more than 6 hr AT/AF.  Patient Activity less than 1 hr/day for 4 weeks.  Spoke with daughter [name].  She reported patients urine is very dark and only drinks about 16 fluid ounces daily.  She has a slight cough.   Patient is switching to [place] office 12/09/2017 with Dr Domenic Polite and Dr Lovena Le due to location is closer to home.    Thoracic impedance abnormal suggesting fluid accumulation.   Prescribed: Furosemide20Take3 tablets (60 mg total)daily.  Potassium 20 mEqTake 1 capsule (20 mEq) daily. Take an extra 1 capsule (20 mEq) on days you take an extra lasix. Metolazone 2.5 mg 1 tablet twice a week as needed. She is also taking extra Potassium when taking Metolazone.  Labs: 10/09/2017 Creatinine 1.28, BUN 28, Potassium 3.6, Sodium 135, eGFR 36-41 05/27/2017 Creatinine 1.11, BUN 35, Potassium 3.8, Sodium 132, EGFR 42-49 04/17/2017 Creatinine1.00, BUN41, Potassium4.4, Sodium132, [id number] 04/16/2017 Creatinine1.11, BUN46, Potassium4.5, Sodium129, [id number]  04/15/2017 Creatinine1.39, BUN40, Potassium4.6, [id number], [id number]  04/14/2017 Creatinine1.28, BUN40, Potassium4.9, [id number], [id number]  04/13/2017 Creatinine1.24, BUN35, Potassium4.9, Sodium130, E NIO27-03  04/12/2017 Creatinine1.32, BUN35, Potassium4.2, Sodium129, [id number]  04/11/2017 Creatinine1.42, BUN40, Potassium4.5, [id number], VELF81-01  03/24/2017 Creatinine1.42, BUN26, Potassium3.8, [id number],  DPOE42-35  Recommendations: Daughter is hesitant to give her Metolazone because urine is so dark and she is drinking <16 oz daily.  Advised to increase fluid intake which will help increase urine output and urine will be lighter in color.  Advised to continue to monitor for symptoms at this time and if she becomes more symptomatic then the Metolazone is available but only if she increases her fluid intake because the Metolzaone may dehydrate her too much with more fluid intake.  Advised her to call for changes.   Follow-up plan: ICM clinic phone appointment on 12/08/2017 to recheck fluid levels.   Office appointment scheduled 12/09/2017 with Dr. Domenic Polite and 12/17/2017 with Dr Lovena Le.    Copy of ICM check sent to Dr. Sallyanne Kuster for review and if any recommendations will call her back.    3 month ICM trend: 11/30/2017    1 Year ICM trend:       [name], RN 11/30/2017 10:59 AM

## 2017-12-08 ENCOUNTER — Ambulatory Visit (INDEPENDENT_AMBULATORY_CARE_PROVIDER_SITE_OTHER): Payer: PPO

## 2017-12-08 ENCOUNTER — Telehealth: Payer: Self-pay | Admitting: Cardiology

## 2017-12-08 DIAGNOSIS — Z950 Presence of cardiac pacemaker: Secondary | ICD-10-CM

## 2017-12-08 DIAGNOSIS — I5042 Chronic combined systolic (congestive) and diastolic (congestive) heart failure: Secondary | ICD-10-CM

## 2017-12-08 NOTE — Telephone Encounter (Signed)
Confirmed remote transmission w/ pt daughter.   

## 2017-12-08 NOTE — Progress Notes (Signed)
Cardiology Office Note  Date: 12/09/2017   ID: Kathleen [name], DOB 01-10-1925, MRN [id number]  PCP: Elfredia Nevins, MD  Primary Cardiologist: Nona Dell, MD   Chief Complaint  Patient presents with  . Cardiomyopathy    History of Present Illness: Kathleen [name] is a 84 y.o. female patient of Dr. Royann Shivers, following up with me for the first time in clinic today.  I reviewed her records.  She saw Dr. Royann Shivers back in August which point medical therapy was continued for treatment of nonischemic cardiomyopathy.  She is here with her daughter and great granddaughter today.  We discussed her symptoms.  Overall, she is very frail and limited in her activities, uses a rolling walker and is sedentary mostly at home.  She complains of lack of energy, also naps during the day.  She has had no sudden palpitations or syncope.  He does not report any lower extremity edema or orthopnea.  Weights have reportedly been stable at home, although it does not sound like they check it regularly.  Patient has a Medtronic CRT-P in place, has followed with Dr. Royann Shivers.  Recent device interrogation demonstrated 2.6% AT/AF episodes with 100% ventricular pacing, also abnormal thoracic impedance.  She will be establishing with Dr. Ladona Ridgel in the device clinic in [place] going forward.  Last echocardiogram in March revealed LVEF 25 to 30% range.  Past Medical History:  Diagnosis Date  . Acid reflux   . AV block, 2nd degree    a. s/p Medtronic PPM placement in 2013 with Bi-V upgrade and CRT  . CAD (coronary artery disease)   . Chronic kidney disease (CKD), stage III (moderate) (HCC)   . Essential hypertension   . History of pneumonia   . Hyperlipidemia   . Macular degeneration   . Nonischemic cardiomyopathy (HCC)    a. EF 40-45% by echo in 2017 b. reduced to 25-30% by repeat echo in 03/2017  . PAF (paroxysmal atrial fibrillation) (HCC)    a. on Eliquis and Amiodarone  . Type 2 diabetes  mellitus (HCC)     Past Surgical History:  Procedure Laterality Date  . Bladder tack  1970's  . CARDIAC CATHETERIZATION  10/15/2011   Normal coronaries  . CARDIOVERSION N/A 10/28/2013   Procedure: CARDIOVERSION;  Surgeon: Thurmon Fair, MD;  [facility]: [facility];  Service: Cardiovascular;  Laterality: N/A;  . CARDIOVERSION N/A 08/20/2016   Procedure: CARDIOVERSION;  Surgeon: Thurmon Fair, MD;  Location: [facility];  Service: Cardiovascular;  Laterality: N/A;  . CHOLECYSTECTOMY  1999  . INTRAMEDULLARY (IM) NAIL INTERTROCHANTERIC Right 11/03/2015   Procedure: INTRAMEDULLARY (IM) NAIL RIGHT INTERTROCHANTERIC HIP FRACTURE;  Surgeon: Tarry Kos, MD;  Location: [facility] OR;  Service: Orthopedics;  Laterality: Right;  . LEFT AND RIGHT HEART CATHETERIZATION WITH CORONARY ANGIOGRAM N/A 10/15/2011   Procedure: LEFT AND RIGHT HEART CATHETERIZATION WITH CORONARY ANGIOGRAM;  Surgeon: Chrystie Nose, MD;  Location: [facility] CATH LAB;  Service: Cardiovascular;  Laterality: N/A;  . NM MYOCAR PERF WALL MOTION  03/15/2009   Normal  . PACEMAKER INSERTION  10/16/2011   Medtronic  . PERMANENT PACEMAKER INSERTION N/A 03/24/2011   Procedure: PERMANENT PACEMAKER INSERTION;  Surgeon: Thurmon Fair, MD;  Location: [facility] CATH [facility];  Service: Cardiovascular;  Laterality: N/A;  . REPLACEMENT TOTAL KNEE      Current Outpatient Medications  Medication Sig Dispense Refill  . acetaminophen (TYLENOL) 325 MG tablet Take 650 mg by mouth every 4 (four) hours as needed for mild pain or moderate  pain.     . amiodarone (PACERONE) 200 MG tablet Take 1 tablet (200 mg total) by mouth daily. 30 tablet 2  . Coenzyme Q10 (CO Q 10 PO) Take 1 tablet by mouth daily.    Marland Kitchen DM-Doxylamine-Acetaminophen (NYQUIL COLD & FLU PO) Take 1 capsule by mouth at bedtime as needed (sleep).    Marland Kitchen ELIQUIS 2.5 MG TABS tablet TAKE ONE TABLET BY MOUTH TWICE DAILY. 180 tablet 1  . ENTRESTO 24-26 MG TAKE (1) TABLET BY MOUTH TWICE DAILY. 60 tablet 0  . ferrous  sulfate 325 (65 FE) MG tablet Take 325 mg by mouth daily with breakfast.     . furosemide (LASIX) 20 MG tablet Take 3 tablets (60 mg total) by mouth daily. 90 tablet 2  . metolazone (ZAROXOLYN) 2.5 MG tablet Take 1 tablet by mouth once or twice a week as needed. 24 tablet 3  . metoprolol succinate (TOPROL-XL) 25 MG 24 hr tablet Take 3 tablets (75 mg total) by mouth 2 (two) times daily. 180 tablet 3  . nitroGLYCERIN (NITROSTAT) 0.4 MG SL tablet DISSOLVE 1 TABLET UNDER TONGUE EVERY 5 MINUTES UP TO 15 MIN FOR CHESTPAIN. IF NO RELIEF CALL 911. 25 tablet 4  . ondansetron (ZOFRAN) 4 MG tablet Take 4 mg by mouth every 4 (four) hours as needed for nausea or vomiting.    . polyethylene glycol (MIRALAX / GLYCOLAX) packet Take 17 g by mouth as needed.    . potassium chloride SA (K-DUR,KLOR-CON) 20 MEQ tablet Take 1 capsule (20 mEq) daily. Take an extra 1 capsule (20 mEq) on days you take an extra lasix. 45 tablet 3   No current facility-administered medications for this visit.    Allergies:  Coumadin [warfarin sodium]; Meloxicam; Metformin and related; Morphine; Nabumetone; Oxycodone; and Sulfonamide derivatives   Social History: The patient  reports that she has never smoked. She has never used smokeless tobacco. She reports that she does not drink alcohol or use drugs.   ROS:  Please see the history of present illness. Otherwise, complete review of systems is positive for hearing loss, arthritic pains and stiffness.  All other systems are reviewed and negative.   Physical Exam: VS:  BP 118/70 (BP Location: Left Arm)   Pulse 81   Wt 108 lb (49 kg)   SpO2 91%   BMI 16.42 kg/m , BMI Body mass index is 16.42 kg/m.  Wt Readings from Last 3 Encounters:  12/09/17 108 lb (49 kg)  10/09/17 130 lb (59 kg)  09/03/17 113 lb 3.2 oz (51.3 kg)    General: Frail elderly woman, appears comfortable at rest.  Using a rolling walker. HEENT: Conjunctiva and lids normal, oropharynx clear. Neck: Supple, no elevated  JVP or carotid bruits, no thyromegaly. Lungs: Clear to auscultation, nonlabored breathing at rest. Cardiac: Regular rate and rhythm, no S3, soft systolic murmur. Abdomen: Soft, nontender, bowel sounds present. Extremities: No pitting edema, distal pulses 2+. Skin: Warm and dry. Musculoskeletal: No kyphosis.  Some muscle wasting noted. Neuropsychiatric: Alert and oriented x3, affect grossly appropriate.  ECG: I personally reviewed the tracing from 10/09/2017 which showed a dual-chamber paced rhythm.  Recent Labwork: 09/03/2017: TSH 3.500 10/09/2017: ALT 15; AST 19; B Natriuretic Peptide 767.0; BUN 28; Creatinine, Ser 1.28; Hemoglobin 10.6; Platelets 284; Potassium 3.6; Sodium 135     Component Value Date/Time   CHOL 151 04/11/2014 0820   TRIG 108 04/11/2014 0820   HDL 60 04/11/2014 0820   CHOLHDL 2.5 04/11/2014 0820   VLDL  22 04/11/2014 0820   LDLCALC 69 04/11/2014 0820    Other Studies Reviewed Today:  Echocardiogram 04/12/2017: Study Conclusions  - Left ventricle: The cavity size was mildly dilated. Systolic   function was severely reduced. The estimated ejection fraction   was in the range of 25% to 30%. Severe diffuse hypokinesis with   distinct regional wall motion abnormalities. There is akinesis of   the entireinferior myocardium. There is akinesis of the   apicalanterior and apical myocardium. There was a reduced   contribution of atrial contraction to ventricular filling, due to   increased ventricular diastolic pressure or atrial contractile   dysfunction. Doppler parameters are consistent with a reversible   restrictive pattern, indicative of decreased left ventricular   diastolic compliance and/or increased left atrial pressure (grade   3 diastolic dysfunction). Doppler parameters are consistent with   high ventricular filling pressure. - Mitral valve: Calcified annulus. Mildly thickened leaflets .   There was mild regurgitation. - Left atrium: The atrium was  mildly dilated. - Pulmonary arteries: PA peak pressure: 43 mm Hg (S). - Pericardium, extracardiac: A small, free-flowing pericardial   effusion was identified along the right ventricular free wall and   along the right atrial free wall. The fluid had no internal   echoes.There was no evidence of hemodynamic compromise. There was   a left pleural effusion.  Impressions:  - The right ventricular systolic pressure was increased consistent   with moderate pulmonary hypertension.  Assessment and Plan:  1.  Nonischemic cardiomyopathy, LVEF 25 to 30% range by echocardiogram in March.  Weight is down compared to previous clinical assessment, she has no evidence of fluid excess at this time on examination.  Medical regimen looks good, currently on Toprol-XL, Entresto, Lasix with potassium supplements, and as needed metolazone.  She has had no obvious decompensation or hospital stay since March.  A follow-up echocardiogram is planned to get reevaluation of LVEF, although we did discuss the fact that conservative medical therapy is planned going forward.  2.  Paroxysmal atrial fibrillation, brief recurrences based on device interrogation but no palpitations.  She is on Eliquis for stroke prophylaxis along with amiodarone.  Recent TSH and LFTs normal.  3.  Medtronic CRT-P in place, now to follow-up with Dr. Ladona Ridgel in the device clinic.  4.  Hyperlipidemia, diet managed at this time.  She follows with Dr. Sherwood Gambler.  Current medicines were reviewed with the patient today.   Orders Placed This Encounter  Procedures  . ECHOCARDIOGRAM COMPLETE    Disposition: Follow-up in 3 months.  Signed, Jonelle Sidle, MD, [facility] 12/09/2017 1:24 PM    [facility] at [facility] [address] [place], [place] [address] Phone: [phone]; Fax: [phone]

## 2017-12-08 NOTE — Progress Notes (Signed)
EPIC Encounter for ICM Monitoring  Patient Name: Kathleen [name] is a 84 y.o. female Date: 12/08/2017 Primary Care Physican: Fusco, Lawrence, MD Primary Cardiologist:Croitoru/McDowell Electrophysiologist: Taylor replacing Dr Croitoru Bi-V Pacing: 100% Today's Weight:103 lbs        Spoke with daughter.  Heart Failure questions reviewed, pt has been sleeping a lot during the day and at night talks a lot during the night time.  She does not have much have an appetite and drink mostly Ensure.  She needs help dressing for the last 2 days.           Patient transferring from Dr Croitoru to Dr McDowell and Dr Taylor due to convenience it is closer to her home.     Thoracic impedance slightly below baseline suggesting fluid accumulation.   Prescribed: Furosemide20Take3 tablets (60 mg total)daily.  Potassium 20 mEqTake 1 capsule (20 mEq) daily. Take an extra 1 capsule (20 mEq) on days you take an extra lasix. Metolazone 2.5 mg 1 tablet twice a week as needed. She is also taking extra Potassium when taking Metolazone.  Labs: 10/09/2017 Creatinine 1.28, BUN 28, Potassium 3.6, Sodium 135, eGFR 36-41 05/27/2017 Creatinine 1.11, BUN 35, Potassium 3.8, Sodium 132, EGFR 42-49 04/17/2017 Creatinine1.00, BUN41, Potassium4.4, Sodium132, EGFR48-56 04/16/2017 Creatinine1.11, BUN46, Potassium4.5, Sodium129, EGFR42-49  04/15/2017 Creatinine1.39, BUN40, Potassium4.6, Sodium128, EGFR32-37  04/14/2017 Creatinine1.28, BUN40, Potassium4.9, Sodium128, EGFR36-41  04/13/2017 Creatinine1.24, BUN35, Potassium4.9, Sodium130, E GFR38-43  04/12/2017 Creatinine1.32, BUN35, Potassium4.2, Sodium129, EGFR34-40  04/11/2017 Creatinine1.42, BUN40, Potassium4.5, Sodium125, EGFR31-36  03/24/2017 Creatinine1.42, BUN26, Potassium3.8, Sodium133, EGFR31-36  Recommendations: No changes.   Encouraged to call for fluid symptoms.  Follow-up plan: ICM clinic phone appointment on  12/31/2017.   Office appointment scheduled 12/09/2017 with Dr. McDowell (1st visit) and 12/17/2017 with Dr Taylor (1st visit).    Copy of ICM check sent to Dr. Croitoru.   3 month ICM trend: 12/08/2017    AT/AF   1 Year ICM trend:       Laurie S Short, RN 12/08/2017 12:01 PM   

## 2017-12-09 ENCOUNTER — Ambulatory Visit (INDEPENDENT_AMBULATORY_CARE_PROVIDER_SITE_OTHER): Payer: PPO | Admitting: Cardiology

## 2017-12-09 ENCOUNTER — Encounter: Payer: Self-pay | Admitting: Cardiology

## 2017-12-09 VITALS — BP 118/70 | HR 81 | Wt 108.0 lb

## 2017-12-09 DIAGNOSIS — I428 Other cardiomyopathies: Secondary | ICD-10-CM | POA: Diagnosis not present

## 2017-12-09 DIAGNOSIS — I480 Paroxysmal atrial fibrillation: Secondary | ICD-10-CM

## 2017-12-09 DIAGNOSIS — Z950 Presence of cardiac pacemaker: Secondary | ICD-10-CM

## 2017-12-09 DIAGNOSIS — I5022 Chronic systolic (congestive) heart failure: Secondary | ICD-10-CM | POA: Diagnosis not present

## 2017-12-09 NOTE — Patient Instructions (Signed)

## 2017-12-17 ENCOUNTER — Ambulatory Visit (INDEPENDENT_AMBULATORY_CARE_PROVIDER_SITE_OTHER): Payer: PPO | Admitting: Internal Medicine

## 2017-12-17 ENCOUNTER — Encounter: Payer: Self-pay | Admitting: Internal Medicine

## 2017-12-17 ENCOUNTER — Ambulatory Visit (HOSPITAL_COMMUNITY): Admission: RE | Admit: 2017-12-17 | Payer: PPO | Source: Ambulatory Visit

## 2017-12-17 VITALS — BP 98/50 | HR 74 | Ht 67.0 in | Wt 112.0 lb

## 2017-12-17 DIAGNOSIS — I441 Atrioventricular block, second degree: Secondary | ICD-10-CM

## 2017-12-17 DIAGNOSIS — Z45018 Encounter for adjustment and management of other part of cardiac pacemaker: Secondary | ICD-10-CM

## 2017-12-17 DIAGNOSIS — I5022 Chronic systolic (congestive) heart failure: Secondary | ICD-10-CM | POA: Diagnosis not present

## 2017-12-17 LAB — CUP PACEART INCLINIC DEVICE CHECK
Battery Voltage: 2.94 V
Brady Statistic AP VP Percent: 95.54 %
Brady Statistic AP VS Percent: 0.02 %
Brady Statistic AS VP Percent: 4.44 %
Brady Statistic RA Percent Paced: 95.24 %
Brady Statistic RV Percent Paced: 99.98 %
Date Time Interrogation Session: 20191205111913
Implantable Lead Implant Date: 20131003
Implantable Lead Location: 753858
Implantable Lead Location: 753859
Implantable Lead Location: 753860
Implantable Lead Model: 4194
Lead Channel Impedance Value: 285 Ohm
Lead Channel Impedance Value: 380 Ohm
Lead Channel Impedance Value: 418 Ohm
Lead Channel Impedance Value: 418 Ohm
Lead Channel Impedance Value: 456 Ohm
Lead Channel Impedance Value: 570 Ohm
Lead Channel Impedance Value: 589 Ohm
Lead Channel Pacing Threshold Amplitude: 0.875 V
Lead Channel Pacing Threshold Pulse Width: 0.4 ms
Lead Channel Sensing Intrinsic Amplitude: 1.25 mV
Lead Channel Sensing Intrinsic Amplitude: 1.625 mV
Lead Channel Setting Pacing Amplitude: 2.5 V
Lead Channel Setting Pacing Pulse Width: 0.4 ms
Lead Channel Setting Sensing Sensitivity: 4 mV
MDC IDC LEAD IMPLANT DT: 20130311
MDC IDC LEAD IMPLANT DT: 20130311
MDC IDC MSMT BATTERY REMAINING LONGEVITY: 29 mo
MDC IDC MSMT LEADCHNL LV PACING THRESHOLD AMPLITUDE: 1.125 V
MDC IDC MSMT LEADCHNL LV PACING THRESHOLD PULSEWIDTH: 0.4 ms
MDC IDC MSMT LEADCHNL RA IMPEDANCE VALUE: 304 Ohm
MDC IDC MSMT LEADCHNL RV IMPEDANCE VALUE: 323 Ohm
MDC IDC MSMT LEADCHNL RV PACING THRESHOLD AMPLITUDE: 1.25 V
MDC IDC MSMT LEADCHNL RV PACING THRESHOLD PULSEWIDTH: 0.4 ms
MDC IDC MSMT LEADCHNL RV SENSING INTR AMPL: 17.375 mV
MDC IDC MSMT LEADCHNL RV SENSING INTR AMPL: 22.625 mV
MDC IDC PG IMPLANT DT: 20131003
MDC IDC SET LEADCHNL LV PACING AMPLITUDE: 2.25 V
MDC IDC SET LEADCHNL LV PACING PULSEWIDTH: 0.4 ms
MDC IDC SET LEADCHNL RA PACING AMPLITUDE: 2 V
MDC IDC STAT BRADY AS VS PERCENT: 0 %

## 2017-12-17 NOTE — Patient Instructions (Signed)
Your physician wants you to follow-up in: 1 year with Dr.Taylor You will receive a reminder letter in the mail two months in advance. If you don't receive a letter, please call our office to schedule the follow-up appointment.     Your physician recommends that you continue on your current medications as directed. Please refer to the Current Medication list given to you today.      If you need a refill on your cardiac medications before your next appointment, please call your pharmacy.      No lab work or tests today.     Thank you for choosing [facility] !         

## 2017-12-17 NOTE — Progress Notes (Signed)
HPI Kathleen [name] returns after a 6 year absence from our EP clinic. She is a pleasant 84 yo woman who has been followed by Dr. Salena Saner and who would like to have her followup in [facility]. She underwent biv PPM insertion 6 years ago. She has gradually lost weight. She is not eating as much. She denies chest pain or sob. No edema. Her appetite is poor. Allergies  Allergen Reactions  . Coumadin [Warfarin Sodium] Other (See Comments)    Caused Patient to Bleed Out.   . Meloxicam Other (See Comments)    Unknown  . Metformin And Related Other (See Comments)    Cannot have due to kidney function  . Morphine Itching    Not in right state of mind.   . Nabumetone Nausea And Vomiting  . Oxycodone Nausea And Vomiting  . Sulfonamide Derivatives Hives     Current Outpatient Medications  Medication Sig Dispense Refill  . acetaminophen (TYLENOL) 325 MG tablet Take 650 mg by mouth every 4 (four) hours as needed for mild pain or moderate pain.     Marland Kitchen amiodarone (PACERONE) 200 MG tablet Take 1 tablet (200 mg total) by mouth daily. 30 tablet 2  . Coenzyme Q10 (CO Q 10 PO) Take 1 tablet by mouth daily.    Marland Kitchen DM-Doxylamine-Acetaminophen (NYQUIL COLD & FLU PO) Take 1 capsule by mouth at bedtime as needed (sleep).    Marland Kitchen ELIQUIS 2.5 MG TABS tablet TAKE ONE TABLET BY MOUTH TWICE DAILY. 180 tablet 1  . ENTRESTO 24-26 MG TAKE (1) TABLET BY MOUTH TWICE DAILY. 60 tablet 0  . ferrous sulfate 325 (65 FE) MG tablet Take 325 mg by mouth daily with breakfast.     . furosemide (LASIX) 20 MG tablet Take 3 tablets (60 mg total) by mouth daily. 90 tablet 2  . metolazone (ZAROXOLYN) 2.5 MG tablet Take 1 tablet by mouth once or twice a week as needed. 24 tablet 3  . metoprolol succinate (TOPROL-XL) 25 MG 24 hr tablet Take 3 tablets (75 mg total) by mouth 2 (two) times daily. 180 tablet 3  . nitroGLYCERIN (NITROSTAT) 0.4 MG SL tablet DISSOLVE 1 TABLET UNDER TONGUE EVERY 5 MINUTES UP TO 15 MIN FOR CHESTPAIN. IF NO  RELIEF CALL 911. 25 tablet 4  . ondansetron (ZOFRAN) 4 MG tablet Take 4 mg by mouth every 4 (four) hours as needed for nausea or vomiting.    . polyethylene glycol (MIRALAX / GLYCOLAX) packet Take 17 g by mouth as needed.    . potassium chloride SA (K-DUR,KLOR-CON) 20 MEQ tablet Take 1 capsule (20 mEq) daily. Take an extra 1 capsule (20 mEq) on days you take an extra lasix. 45 tablet 3   No current facility-administered medications for this visit.      Past Medical History:  Diagnosis Date  . Acid reflux   . AV block, 2nd degree    a. s/p Medtronic PPM placement in 2013 with Bi-V upgrade and CRT  . CAD (coronary artery disease)   . Chronic kidney disease (CKD), stage III (moderate) (HCC)   . Essential hypertension   . History of pneumonia   . Hyperlipidemia   . Macular degeneration   . Nonischemic cardiomyopathy (HCC)    a. EF 40-45% by echo in 2017 b. reduced to 25-30% by repeat echo in 03/2017  . PAF (paroxysmal atrial fibrillation) (HCC)    a. on Eliquis and Amiodarone  . Type 2 diabetes mellitus (HCC)  ROS:   All systems reviewed and negative except as noted in the HPI.   Past Surgical History:  Procedure Laterality Date  . Bladder tack  1970's  . CARDIAC CATHETERIZATION  10/15/2011   Normal coronaries  . CARDIOVERSION N/A 10/28/2013   Procedure: CARDIOVERSION;  Surgeon: Thurmon Fair, MD;  Location: [facility];  Service: Cardiovascular;  Laterality: N/A;  . CARDIOVERSION N/A 08/20/2016   Procedure: CARDIOVERSION;  Surgeon: Thurmon Fair, MD;  Location: [facility];  Service: Cardiovascular;  Laterality: N/A;  . CHOLECYSTECTOMY  1999  . INTRAMEDULLARY (IM) NAIL INTERTROCHANTERIC Right 11/03/2015   Procedure: INTRAMEDULLARY (IM) NAIL RIGHT INTERTROCHANTERIC HIP FRACTURE;  Surgeon: Tarry Kos, MD;  Location: [facility] OR;  Service: Orthopedics;  Laterality: Right;  . LEFT AND RIGHT HEART CATHETERIZATION WITH CORONARY ANGIOGRAM N/A 10/15/2011   Procedure: LEFT AND RIGHT  HEART CATHETERIZATION WITH CORONARY ANGIOGRAM;  Surgeon: Chrystie Nose, MD;  Location: [facility] CATH LAB;  Service: Cardiovascular;  Laterality: N/A;  . NM MYOCAR PERF WALL MOTION  03/15/2009   Normal  . PACEMAKER INSERTION  10/16/2011   Medtronic  . PERMANENT PACEMAKER INSERTION N/A 03/24/2011   Procedure: PERMANENT PACEMAKER INSERTION;  Surgeon: Thurmon Fair, MD;  Location: [facility] CATH LAB;  Service: Cardiovascular;  Laterality: N/A;  . REPLACEMENT TOTAL KNEE       Family History  Problem Relation Age of Onset  . Suicidality Mother   . Heart murmur Daughter   . Heart attack Brother      Social History   Socioeconomic History  . Marital status: Widowed    Spouse name: Not on file  . Number of children: Not on file  . Years of education: Not on file  . Highest education level: Not on file  Occupational History  . Occupation: retired  Engineer, production  . Financial resource strain: Not on file  . Food insecurity:    Worry: Not on file    Inability: Not on file  . Transportation needs:    Medical: Not on file    Non-medical: Not on file  Tobacco Use  . Smoking status: Never Smoker  . Smokeless tobacco: Never Used  Substance and Sexual Activity  . Alcohol use: No  . Drug use: No  . Sexual activity: Not Currently  Lifestyle  . Physical activity:    Days per week: Not on file    Minutes per session: Not on file  . Stress: Not on file  Relationships  . Social connections:    Talks on phone: Not on file    Gets together: Not on file    Attends religious service: Not on file    Active member of club or organization: Not on file    Attends meetings of clubs or organizations: Not on file    Relationship status: Not on file  . Intimate partner violence:    Fear of current or ex partner: Not on file    Emotionally abused: Not on file    Physically abused: Not on file    Forced sexual activity: Not on file  Other Topics Concern  . Not on file  Social History Narrative  . Not on  file     BP (!) 98/50   Pulse 74   Ht 5\' 7"  (1.702 m)   Wt 112 lb (50.8 kg)   SpO2 90% Comment: fingers very cold  BMI 17.54 kg/m   Physical Exam:  Well appearing NAD HEENT: Unremarkable Neck:  No JVD, no thyromegally Lymphatics:  No adenopathy Back:  No CVA tenderness Lungs:  Clear with no wheezes HEART:  Regular rate rhythm, no murmurs, no rubs, no clicks Abd:  soft, positive bowel sounds, no organomegally, no rebound, no guarding Ext:  2 plus pulses, no edema, no cyanosis, no clubbing Skin:  No rashes no nodules Neuro:  CN II through XII intact, motor grossly intact   DEVICE  Normal device function.  See PaceArt for details.   Assess/Plan: 1. PPM - her St. Jude biv PPM is working normally.  2. Chronic systolic heart failure - she appears to be euvolemic. No change in her meds. 2. Biv PPM - her Medtronic biv PPM is working normally. She has 2 years of battery longevity 3. Atrial fib - she has maintained NSR 96% of the time. No change in her meds. 4. Coags - she has not had any bleeding on low dose eliquis.  Leonia Reeves.D

## 2017-12-20 DIAGNOSIS — I5042 Chronic combined systolic (congestive) and diastolic (congestive) heart failure: Secondary | ICD-10-CM | POA: Diagnosis not present

## 2017-12-20 DIAGNOSIS — J449 Chronic obstructive pulmonary disease, unspecified: Secondary | ICD-10-CM | POA: Diagnosis not present

## 2017-12-20 DIAGNOSIS — Z96659 Presence of unspecified artificial knee joint: Secondary | ICD-10-CM | POA: Diagnosis not present

## 2017-12-20 DIAGNOSIS — R269 Unspecified abnormalities of gait and mobility: Secondary | ICD-10-CM | POA: Diagnosis not present

## 2017-12-20 DIAGNOSIS — I5040 Unspecified combined systolic (congestive) and diastolic (congestive) heart failure: Secondary | ICD-10-CM | POA: Diagnosis not present

## 2017-12-21 ENCOUNTER — Other Ambulatory Visit: Payer: Self-pay | Admitting: Cardiovascular Disease

## 2017-12-23 ENCOUNTER — Encounter (HOSPITAL_COMMUNITY): Payer: Self-pay | Admitting: Emergency Medicine

## 2017-12-23 ENCOUNTER — Other Ambulatory Visit: Payer: Self-pay

## 2017-12-23 ENCOUNTER — Emergency Department (HOSPITAL_COMMUNITY): Payer: PPO

## 2017-12-23 ENCOUNTER — Observation Stay (HOSPITAL_COMMUNITY)
Admission: EM | Admit: 2017-12-23 | Discharge: 2017-12-24 | Disposition: A | Discharge status: Home / Self Care | Payer: PPO | Attending: Family Medicine | Admitting: Family Medicine

## 2017-12-23 DIAGNOSIS — I10 Essential (primary) hypertension: Secondary | ICD-10-CM | POA: Diagnosis not present

## 2017-12-23 DIAGNOSIS — E1122 Type 2 diabetes mellitus with diabetic chronic kidney disease: Secondary | ICD-10-CM | POA: Diagnosis not present

## 2017-12-23 DIAGNOSIS — I441 Atrioventricular block, second degree: Secondary | ICD-10-CM | POA: Diagnosis not present

## 2017-12-23 DIAGNOSIS — I130 Hypertensive heart and chronic kidney disease with heart failure and stage 1 through stage 4 chronic kidney disease, or unspecified chronic kidney disease: Secondary | ICD-10-CM | POA: Diagnosis not present

## 2017-12-23 DIAGNOSIS — E878 Other disorders of electrolyte and fluid balance, not elsewhere classified: Secondary | ICD-10-CM

## 2017-12-23 DIAGNOSIS — I2510 Atherosclerotic heart disease of native coronary artery without angina pectoris: Secondary | ICD-10-CM | POA: Insufficient documentation

## 2017-12-23 DIAGNOSIS — J189 Pneumonia, unspecified organism: Secondary | ICD-10-CM | POA: Diagnosis not present

## 2017-12-23 DIAGNOSIS — Z950 Presence of cardiac pacemaker: Secondary | ICD-10-CM | POA: Insufficient documentation

## 2017-12-23 DIAGNOSIS — E871 Hypo-osmolality and hyponatremia: Secondary | ICD-10-CM

## 2017-12-23 DIAGNOSIS — I5042 Chronic combined systolic (congestive) and diastolic (congestive) heart failure: Secondary | ICD-10-CM | POA: Insufficient documentation

## 2017-12-23 DIAGNOSIS — N183 Chronic kidney disease, stage 3 unspecified: Secondary | ICD-10-CM | POA: Diagnosis present

## 2017-12-23 DIAGNOSIS — N390 Urinary tract infection, site not specified: Secondary | ICD-10-CM | POA: Diagnosis not present

## 2017-12-23 DIAGNOSIS — I480 Paroxysmal atrial fibrillation: Secondary | ICD-10-CM | POA: Diagnosis not present

## 2017-12-23 DIAGNOSIS — E440 Moderate protein-calorie malnutrition: Secondary | ICD-10-CM | POA: Diagnosis present

## 2017-12-23 DIAGNOSIS — J181 Lobar pneumonia, unspecified organism: Secondary | ICD-10-CM

## 2017-12-23 DIAGNOSIS — G9341 Metabolic encephalopathy: Principal | ICD-10-CM | POA: Insufficient documentation

## 2017-12-23 DIAGNOSIS — J9611 Chronic respiratory failure with hypoxia: Secondary | ICD-10-CM | POA: Diagnosis not present

## 2017-12-23 DIAGNOSIS — R4182 Altered mental status, unspecified: Secondary | ICD-10-CM

## 2017-12-23 DIAGNOSIS — R05 Cough: Secondary | ICD-10-CM | POA: Diagnosis not present

## 2017-12-23 DIAGNOSIS — I442 Atrioventricular block, complete: Secondary | ICD-10-CM | POA: Diagnosis not present

## 2017-12-23 DIAGNOSIS — E119 Type 2 diabetes mellitus without complications: Secondary | ICD-10-CM

## 2017-12-23 DIAGNOSIS — I129 Hypertensive chronic kidney disease with stage 1 through stage 4 chronic kidney disease, or unspecified chronic kidney disease: Secondary | ICD-10-CM | POA: Diagnosis not present

## 2017-12-23 LAB — CBC
HEMATOCRIT: 28.4 % — AB (ref 36.0–46.0)
HEMOGLOBIN: 8.8 g/dL — AB (ref 12.0–15.0)
MCH: 32.4 pg (ref 26.0–34.0)
MCHC: 31 g/dL (ref 30.0–36.0)
MCV: 104.4 fL — AB (ref 80.0–100.0)
NRBC: 0 % (ref 0.0–0.2)
Platelets: 376 10*3/uL (ref 150–400)
RBC: 2.72 MIL/uL — ABNORMAL LOW (ref 3.87–5.11)
RDW: 14.4 % (ref 11.5–15.5)
WBC: 11.6 10*3/uL — ABNORMAL HIGH (ref 4.0–10.5)

## 2017-12-23 LAB — URINALYSIS, ROUTINE W REFLEX MICROSCOPIC
BILIRUBIN URINE: NEGATIVE
GLUCOSE, UA: NEGATIVE mg/dL
Hgb urine dipstick: NEGATIVE
Ketones, ur: NEGATIVE mg/dL
Nitrite: NEGATIVE
PH: 6 (ref 5.0–8.0)
Protein, ur: NEGATIVE mg/dL
Specific Gravity, Urine: 1.014 (ref 1.005–1.030)
WBC, UA: >50 WBC/hpf — ABNORMAL HIGH (ref 0–5)

## 2017-12-23 LAB — COMPREHENSIVE METABOLIC PANEL
ALBUMIN: 3.3 g/dL — AB (ref 3.5–5.0)
ALT: 34 U/L (ref 0–44)
AST: 35 U/L (ref 15–41)
Alkaline Phosphatase: 131 U/L — ABNORMAL HIGH (ref 38–126)
Anion gap: 12 (ref 5–15)
BUN: 30 mg/dL — AB (ref 8–23)
CALCIUM: 8.9 mg/dL (ref 8.9–10.3)
CO2: 35 mmol/L — AB (ref 22–32)
CREATININE: 1.12 mg/dL — AB (ref 0.44–1.00)
Chloride: 78 mmol/L — ABNORMAL LOW (ref 98–111)
GFR calc Af Amer: 50 mL/min — ABNORMAL LOW (ref >60)
GFR, EST NON AFRICAN AMERICAN: 43 mL/min — AB (ref >60)
Glucose, Bld: 280 mg/dL — ABNORMAL HIGH (ref 70–99)
POTASSIUM: 4.4 mmol/L (ref 3.5–5.1)
SODIUM: 125 mmol/L — AB (ref 135–145)
TOTAL PROTEIN: 7.2 g/dL (ref 6.5–8.1)
Total Bilirubin: 0.9 mg/dL (ref 0.3–1.2)

## 2017-12-23 LAB — TSH: TSH: 3.719 u[IU]/mL (ref 0.350–4.500)

## 2017-12-23 LAB — GLUCOSE, CAPILLARY
GLUCOSE-CAPILLARY: 195 mg/dL — AB (ref 70–99)
Glucose-Capillary: 136 mg/dL — ABNORMAL HIGH (ref 70–99)

## 2017-12-23 LAB — HEMOGLOBIN A1C
Hgb A1c MFr Bld: 7.5 % — ABNORMAL HIGH (ref 4.8–5.6)
Mean Plasma Glucose: 168.55 mg/dL

## 2017-12-23 LAB — CBG MONITORING, ED: GLUCOSE-CAPILLARY: 278 mg/dL — AB (ref 70–99)

## 2017-12-23 LAB — VITAMIN B12: VITAMIN B 12: 1248 pg/mL — AB (ref 180–914)

## 2017-12-23 MED ORDER — METOPROLOL SUCCINATE ER 50 MG PO TB24
75.0000 mg | ORAL_TABLET | Freq: Two times a day (BID) | ORAL | Status: DC
  Administered 2017-12-23 – 2017-12-24 (×2): 75 mg via ORAL
  Filled 2017-12-23 (×2): qty 1

## 2017-12-23 MED ORDER — INFLUENZA VAC SPLIT HIGH-DOSE 0.5 ML IM SUSY: 0.5000 mL | PREFILLED_SYRINGE | INTRAMUSCULAR | Status: DC

## 2017-12-23 MED ORDER — FERROUS SULFATE 325 (65 FE) MG PO TABS
325.0000 mg | ORAL_TABLET | Freq: Every day | ORAL | Status: DC
  Administered 2017-12-24: 325 mg via ORAL
  Filled 2017-12-23: qty 1

## 2017-12-23 MED ORDER — SODIUM CHLORIDE 0.9 % IV SOLN
500.0000 mg | INTRAVENOUS | Status: DC
  Administered 2017-12-23: 500 mg via INTRAVENOUS
  Filled 2017-12-23 (×3): qty 500

## 2017-12-23 MED ORDER — SODIUM CHLORIDE 0.9 % IV BOLUS
500.0000 mL | Freq: Once | INTRAVENOUS | Status: AC
  Administered 2017-12-23: 500 mL via INTRAVENOUS

## 2017-12-23 MED ORDER — SODIUM CHLORIDE 0.9 % IV SOLN
1.0000 g | INTRAVENOUS | Status: DC
  Administered 2017-12-24: 1 g via INTRAVENOUS
  Filled 2017-12-23 (×2): qty 10

## 2017-12-23 MED ORDER — SODIUM CHLORIDE 0.9% FLUSH: 3.0000 mL | INTRAVENOUS | Status: DC | PRN

## 2017-12-23 MED ORDER — APIXABAN 2.5 MG PO TABS
2.5000 mg | ORAL_TABLET | Freq: Two times a day (BID) | ORAL | Status: DC
  Administered 2017-12-23 – 2017-12-24 (×2): 2.5 mg via ORAL
  Filled 2017-12-23 (×2): qty 1

## 2017-12-23 MED ORDER — ACETAMINOPHEN 325 MG PO TABS
650.0000 mg | ORAL_TABLET | ORAL | Status: DC | PRN
  Administered 2017-12-23: 650 mg via ORAL
  Filled 2017-12-23: qty 2

## 2017-12-23 MED ORDER — SODIUM CHLORIDE 0.9 % IV SOLN: 250.0000 mL | INTRAVENOUS | Status: DC | PRN

## 2017-12-23 MED ORDER — SODIUM CHLORIDE 0.9% FLUSH
3.0000 mL | Freq: Two times a day (BID) | INTRAVENOUS | Status: DC
  Administered 2017-12-24: 3 mL via INTRAVENOUS

## 2017-12-23 MED ORDER — INSULIN ASPART 100 UNIT/ML ~~LOC~~ SOLN: 0.0000 [IU] | Freq: Every day | SUBCUTANEOUS | Status: DC

## 2017-12-23 MED ORDER — TRAZODONE HCL 50 MG PO TABS
50.0000 mg | ORAL_TABLET | Freq: Every day | ORAL | Status: DC
  Administered 2017-12-23: 50 mg via ORAL
  Filled 2017-12-23: qty 1

## 2017-12-23 MED ORDER — NITROGLYCERIN 0.4 MG SL SUBL: 0.4000 mg | SUBLINGUAL_TABLET | SUBLINGUAL | Status: DC | PRN

## 2017-12-23 MED ORDER — AMIODARONE HCL 200 MG PO TABS
200.0000 mg | ORAL_TABLET | Freq: Every day | ORAL | Status: DC
  Administered 2017-12-24: 200 mg via ORAL
  Filled 2017-12-23: qty 1

## 2017-12-23 MED ORDER — INSULIN ASPART 100 UNIT/ML ~~LOC~~ SOLN
0.0000 [IU] | Freq: Three times a day (TID) | SUBCUTANEOUS | Status: DC
  Administered 2017-12-23: 2 [IU] via SUBCUTANEOUS
  Administered 2017-12-24 (×2): 1 [IU] via SUBCUTANEOUS

## 2017-12-23 MED ORDER — POLYETHYLENE GLYCOL 3350 17 G PO PACK: 17.0000 g | PACK | ORAL | Status: DC | PRN

## 2017-12-23 MED ORDER — ONDANSETRON HCL 4 MG PO TABS: 4.0000 mg | ORAL_TABLET | ORAL | Status: DC | PRN

## 2017-12-23 MED ORDER — SODIUM CHLORIDE 0.9 % IV SOLN
1.0000 g | Freq: Once | INTRAVENOUS | Status: AC
  Administered 2017-12-23: 1 g via INTRAVENOUS
  Filled 2017-12-23: qty 10

## 2017-12-23 MED ORDER — SODIUM CHLORIDE 0.9 % IV SOLN: Freq: Once | INTRAVENOUS | Status: DC

## 2017-12-23 NOTE — ED Triage Notes (Signed)
Patient's family states patient has been having visual hallucinations x 3 days. Also complaining of wheezing starting last night. Patient denies pain, states "just breathing hard."

## 2017-12-23 NOTE — ED Notes (Signed)
ED Provider at bedside. 

## 2017-12-23 NOTE — H&P (Signed)
History and Physical   Kathleen [name] VFI:[id number] DOB: 28-Jun-1924 DOA: 12/23/2017  Referring MD/NP/PA: Dr. Lacinda Axon, [place] PCP: Redmond School, MD Outpatient Specialists: EP, Dr. Lovena Le; Cardiology, Dr. Domenic Polite  Patient coming from: Home  Chief Complaint: Hallucinations, confusion  HPI: Kathleen [name] is a 84 y.o. 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   Due to current encephalopathy, history was primarily gleaned from daughter with ROS performed based on current symptoms reported or denied by patient. Her daughter reports 4 days of waxing/waning mild confusion, seeing things and people that are not there. This increased in severity to last night when she reported seeing a man and three women that were at her bedside. She felt threatened which was improved with her daughter, with whom she lives, sleeping in her room. She has not been sleeping at night and began having some coughing but had no specific complaints. She's been on 3L O2 at home, unchanged recently.   ED Course: Found to have no fever, VSS, 100% on 3LPM O2, urinalysis strongly suggestive of UTI, culture sent. CXR demonstrating bronchiectasis/scarring with acute findings of right-sided opacities consistent with pneumonia. Abnormal metabolic panel including Na 125, Cl 78, bicarb 35, BUN 30, Cr. 1.12, and glucose 280. LFTs essentially normal with alk phos 131. WBC 11.6, hgb 8.8, and ECG showed AV paced rhythm. Ceftriaxone and azithromycin and 500cc NS given, hospitalists called for admission due to persistent encephalopathy due to PNA and UTI.  Review of Systems: Some lower abdominal pain that is new, and chronic upper/mid back pain. No chest pain, dyspnea, palpitations, and per HPI. All others reviewed and are negative.   Past Medical  History:  Diagnosis Date  . Acid reflux   . AV block, 2nd degree    a. s/p Medtronic PPM placement in 2013 with Bi-V upgrade and CRT  . CAD (coronary artery disease)   . Chronic kidney disease (CKD), stage III (moderate) (HCC)   . Essential hypertension   . History of pneumonia   . Hyperlipidemia   . Macular degeneration   . Nonischemic cardiomyopathy ([place])    a. EF 40-45% by echo in 2017 b. reduced to 25-30% by repeat echo in 03/2017  . PAF (paroxysmal atrial fibrillation) (HCC)    a. on Eliquis and Amiodarone  . Type 2 diabetes mellitus ([place])    Past Surgical History:  Procedure Laterality Date  . Bladder tack  1970's  . CARDIAC CATHETERIZATION  10/15/2011   Normal coronaries  . CARDIOVERSION N/A 10/28/2013   Procedure: CARDIOVERSION;  Surgeon: Sanda Klein, MD;  Location: [facility];  Service: Cardiovascular;  Laterality: N/A;  . CARDIOVERSION N/A 08/20/2016   Procedure: CARDIOVERSION;  Surgeon: Sanda Klein, MD;  Location: [place];  Service: Cardiovascular;  Laterality: N/A;  . CHOLECYSTECTOMY  1999  . INTRAMEDULLARY (IM) NAIL INTERTROCHANTERIC Right 11/03/2015   Procedure: INTRAMEDULLARY (IM) NAIL RIGHT INTERTROCHANTERIC HIP FRACTURE;  Surgeon: Leandrew Koyanagi, MD;  Location: [place];  Service: Orthopedics;  Laterality: Right;  . LEFT AND RIGHT HEART CATHETERIZATION WITH CORONARY ANGIOGRAM N/A 10/15/2011   Procedure: LEFT AND RIGHT HEART CATHETERIZATION WITH CORONARY ANGIOGRAM;  Surgeon: Pixie Casino, MD;  Location: [name];  Service: Cardiovascular;  Laterality: N/A;  . NM MYOCAR PERF WALL MOTION  03/15/2009   Normal  . PACEMAKER  INSERTION  10/16/2011   Medtronic  . PERMANENT PACEMAKER INSERTION N/A 03/24/2011   Procedure: PERMANENT PACEMAKER INSERTION;  Surgeon: Sanda Klein, MD;  Location: [facility] CATH LAB;  Service: Cardiovascular;  Laterality: N/A;  . REPLACEMENT TOTAL KNEE     - Nonsmoker, no EtOH, lives with daughter for past several years.  reports that she  has never smoked. She has never used smokeless tobacco. She reports that she does not drink alcohol or use drugs. Allergies  Allergen Reactions  . Coumadin [Warfarin Sodium] Other (See Comments)    Caused Patient to Bleed Out.   . Meloxicam Other (See Comments)    Unknown  . Metformin And Related Other (See Comments)    Cannot have due to kidney function  . Morphine Itching    Not in right state of mind.   . Nabumetone Nausea And Vomiting  . Oxycodone Nausea And Vomiting  . Sulfonamide Derivatives Hives   Family History  Problem Relation Age of Onset  . Suicidality Mother   . Heart murmur Daughter   . Heart attack Brother    - Family history otherwise reviewed and not pertinent.  Prior to Admission medications   Medication Sig Start Date End Date Taking? Authorizing Provider  acetaminophen (TYLENOL) 325 MG tablet Take 650 mg by mouth every 4 (four) hours as needed for mild pain or moderate pain.    Yes [provider]  amiodarone (PACERONE) 200 MG tablet Take 1 tablet (200 mg total) by mouth daily. 04/18/17  Yes Isaac Bliss, Rayford Halsted, MD  Coenzyme Q10 (CO Q 10 PO) Take 1 tablet by mouth daily.   Yes [provider]  DM-Doxylamine-Acetaminophen (NYQUIL COLD & FLU PO) Take 1 capsule by mouth at bedtime as needed (sleep).   Yes [provider]  ELIQUIS 2.5 MG TABS tablet TAKE ONE TABLET BY MOUTH TWICE DAILY. 10/26/17  Yes Croitoru, Mihai, MD  ENTRESTO 24-26 MG TAKE (1) TABLET BY MOUTH TWICE DAILY. 11/20/17  Yes Croitoru, Mihai, MD  ferrous sulfate 325 (65 FE) MG tablet Take 325 mg by mouth daily with breakfast.    Yes [provider]  furosemide (LASIX) 20 MG tablet Take 3 tablets (60 mg total) by mouth daily. 04/18/17  Yes Isaac Bliss, Rayford Halsted, MD  metolazone (ZAROXOLYN) 2.5 MG tablet Take 1 tablet by mouth once or twice a week as needed. 04/27/17  Yes Croitoru, Mihai, MD  metoprolol succinate (TOPROL-XL) 25 MG 24 hr tablet Take 3 tablets (75 mg  total) by mouth 2 (two) times daily. 09/15/17  Yes Croitoru, Mihai, MD  ondansetron (ZOFRAN) 4 MG tablet Take 4 mg by mouth every 4 (four) hours as needed for nausea or vomiting.   Yes [provider]  polyethylene glycol (MIRALAX / GLYCOLAX) packet Take 17 g by mouth as needed.   Yes [provider]  potassium chloride SA (K-DUR,KLOR-CON) 20 MEQ tablet Take 1 capsule (20 mEq) daily. Take an extra 1 capsule (20 mEq) on days you take an extra lasix. 08/22/16  Yes Duke, Tami Lin, PA  nitroGLYCERIN (NITROSTAT) 0.4 MG SL tablet DISSOLVE 1 TABLET UNDER TONGUE EVERY 5 MINUTES UP TO 15 MIN FOR CHESTPAIN. IF NO RELIEF CALL 911. 12/25/14   Croitoru, Mihai, MD  benazepril (LOTENSIN) 40 MG tablet Take 40 mg by mouth 2 (two) times daily.  05/27/17  [provider]    Physical Exam: Vitals:   12/23/17 1330 12/23/17 1400 12/23/17 1430 12/23/17 1500  BP: 132/77 101/70 109/65 130/73  Pulse:  70 68 71 69  Resp: (!) 23 19 (!) 21 19  Temp:      TempSrc:      SpO2: 98% 100% 100% 96%  Weight:      Height:       Constitutional: Elderly female resting quietly, calmly Eyes: Lids and conjunctivae normal, PERRL ENMT: Mucous membranes are moist. Posterior pharynx clear of any exudate or lesions. poor dentition.  Neck: normal, supple, no masses, no thyromegaly Respiratory: Non-labored breathing 3L O2 without accessory muscle use. Rhonchi on right base laterally, otherwise breath sounds to auscultation bilaterally Cardiovascular: Regular rate and rhythm, no murmurs, rubs, or gallops. No carotid bruits. No JVD. No LE edema. 2+ pedal pulses. Abdomen: Normoactive bowel sounds. +suprapubic tenderness, no CVA tenderness to percussion, otherwise no tenderness, non-distended, and no masses palpated. No hepatosplenomegaly. GU: +External catheter with light yellow urine, no hematuria. Musculoskeletal: No clubbing / cyanosis. No joint deformity upper and lower extremities. Good ROM, no contractures.  Normal muscle tone.  Skin: Warm, dry. No rashes, wounds, or ulcers. No significant lesions noted.  Neurologic: CN II-XII grossly intact. Gait not assessed. Speech normal. No focal deficits in motor strength or sensation in all extremities.  Psychiatric: Alert, disoriented. Pleasantly confused.   Labs on Admission: I have personally reviewed following labs and imaging studies  CBC: Recent Labs  Lab 12/23/17 1022  WBC 11.6*  HGB 8.8*  HCT 28.4*  MCV 104.4*  PLT 086   Basic Metabolic Panel: Recent Labs  Lab 12/23/17 1022  NA 125*  K 4.4  CL 78*  CO2 35*  GLUCOSE 280*  BUN 30*  CREATININE 1.12*  CALCIUM 8.9   GFR: Estimated Creatinine Clearance: 26.2 mL/min (A) (by C-G formula based on SCr of 1.12 mg/dL (H)). Liver Function Tests: Recent Labs  Lab 12/23/17 1022  AST 35  ALT 34  ALKPHOS 131*  BILITOT 0.9  PROT 7.2  ALBUMIN 3.3*   CBG: Recent Labs  Lab 12/23/17 1011  GLUCAP 278*   Urine analysis:    Component Value Date/Time   COLORURINE AMBER (A) 12/23/2017 1006   APPEARANCEUR CLOUDY (A) 12/23/2017 1006   LABSPEC 1.014 12/23/2017 1006   PHURINE 6.0 12/23/2017 1006   GLUCOSEU NEGATIVE 12/23/2017 1006   HGBUR NEGATIVE 12/23/2017 1006   BILIRUBINUR NEGATIVE 12/23/2017 1006   KETONESUR NEGATIVE 12/23/2017 1006   PROTEINUR NEGATIVE 12/23/2017 1006   UROBILINOGEN 0.2 10/16/2013 0910   NITRITE NEGATIVE 12/23/2017 1006   LEUKOCYTESUR LARGE (A) 12/23/2017 1006   Radiological Exams on Admission: Dg Chest 2 View  Result Date: 12/23/2017 CLINICAL DATA:  Cough EXAM: CHEST - 2 VIEW COMPARISON:  10/09/2017 FINDINGS: Cardiac shadow is enlarged. Pacing device is again seen and stable. Aortic calcifications are seen. Patchy infiltrative changes are noted increased from the prior exam particularly in the right apex and right lung base. Central vascular congestion is noted without significant interstitial edema. Small effusions are noted. Changes of bronchiectasis along  the major and minor fissure are again noted similar to that seen on prior CT examination. No acute bony abnormality is noted. IMPRESSION: Patchy acute infiltrate particularly in the right upper lobe and right lung base when compared with the prior exam. Increased central vascular congestion is noted. Stable bronchiectasis and scarring along the minor fissure on the right in the major fissure on the left. Electronically Signed   By: Inez Catalina M.D.   On: 12/23/2017 11:53   EKG: Independently reviewed. AV paced.   Assessment/Plan Principal Problem:   Acute  metabolic encephalopathy Active Problems:   Hypertension   DM type 2 (diabetes mellitus, type 2) (HCC)   Symptomatic advanced heart block, 2:1 AV block, RT BBB, Lt post. fas. block.(March 2013)   Paroxysmal atrial fibrillation (HCC)   Coronary atherosclerosis, no significant obstructive lesions   CRT - pacemaker Medtronic   Chronic combined systolic and diastolic CHF (congestive heart failure) (HCC)   CKD (chronic kidney disease) stage 3, GFR 30-59 ml/min (HCC)   UTI (urinary tract infection)   Malnutrition of moderate degree   Right upper lobe pneumonia (HCC)   Chronic respiratory failure with hypoxia (HCC)   Acute metabolic encephalopathy: Suspected to be due to infection (not sepsis) including right upper lobe and lower lobe pneumonia and UTI, as well as hyponatremia. - Delirium precautions reviewed with daughter who will remain at the bedside. Will trial trazodone qHS to hopefully minimize sundowning/circadian disruption.  - Treat infections with ceftriaxone and azithromycin, monitor urine culture.  - Check TSH, B12 for completeness - Monitor sodium level. Did receive metolazone, lasix today, also given 500cc NS in ED.  Stage III CKD: May have progressed to stage IV CKD, though will monitor to determine this. CrCl currently 45m/min (Cr deceptively low due to sarcopenia).  - Avoid nephrotoxins - Monitor in AM - Hold diuretic for  today. With diminished EF and ability to take po, will not start IVF.   Chronic combined HFrEF, pulmonary HTN: Most recent echo with EF 25-30%, G3DD, PASP 43.  - Monitor I/O - Avoid excessive volume in absence of hypotension. - Hold entresto for now with elevated creatinine, infection.  Chronic hypoxic respiratory failure:  - Continue supplemental oxygen to maintain SpO2 >90%  Anemia of chronic disease:  - Monitor CBC in AM as hgb down to 8.8 from baseline, though few recent values to go on. No bleeding reported by pt or daughter.   PAF, 2nd deg AVB s/p PPM: Recent interrogation showed normal function and pacemaker dependence.  - No need to monitor telemetry for now.  - continue home amiodarone, eliquis (low dose)  HTN:  - Continue home medications  T2DM: Not on regular medications, but hyperglycemic here.  - SSI AC/HS - Check HbA1c  DVT prophylaxis: Eliquis  Code Status: DNR confirmed on admission  Family Communication: Daughter at bedside Disposition Plan: Uncertain, if mentation clears, would be candidate for discharge 12/12. Consults called: None  Admission status: Observation    RPatrecia Pour MD [facility] [email or web address] Password [email or web address] 12/23/2017, 3:50 PM

## 2017-12-23 NOTE — ED Provider Notes (Signed)
[facility] EMERGENCY DEPARTMENT Provider Note   CSN: [id number] Arrival date & time: 12/23/17  4098     History   Chief Complaint Chief Complaint  Patient presents with  . Altered Mental Status    HPI Kathleen [name] is a 84 y.o. female.  Level 5 caveat for altered mental status.  Patient lives independently at home with help from her family.  Daughter reports her thinking has been off for the past several days.  She is also been having auditory and visual hallucinations which is unusual.  Prodromal cough with sputum.  No obvious dysuria, chest pain, fever, chills.     Past Medical History:  Diagnosis Date  . Acid reflux   . AV block, 2nd degree    a. s/p Medtronic PPM placement in 2013 with Bi-V upgrade and CRT  . CAD (coronary artery disease)   . Chronic kidney disease (CKD), stage III (moderate) (HCC)   . Essential hypertension   . History of pneumonia   . Hyperlipidemia   . Macular degeneration   . Nonischemic cardiomyopathy (HCC)    a. EF 40-45% by echo in 2017 b. reduced to 25-30% by repeat echo in 03/2017  . PAF (paroxysmal atrial fibrillation) (HCC)    a. on Eliquis and Amiodarone  . Type 2 diabetes mellitus [facility])     Patient Active Problem List   Diagnosis Date Noted  . Atrial fibrillation, chronic 04/13/2017  . Failure to thrive in adult 04/13/2017  . CHF (congestive heart failure) (HCC) 04/11/2017  . Acute on chronic combined systolic (congestive) and diastolic (congestive) heart failure (HCC) 08/21/2016  . Acute combined systolic and diastolic CHF, NYHA class 1 (HCC) 08/21/2016  . Persistent atrial fibrillation   . Encounter for monitoring amiodarone therapy 06/08/2016  . Right hip pain   . Pressure injury of skin 11/19/2015  . Hematoma of hip, right, initial encounter 11/18/2015  . Abdominal distension   . Malnutrition of moderate degree 11/02/2015  . Closed displaced intertrochanteric fracture of right femur (HCC)   . Hip fracture (HCC)  11/01/2015  . Acute on chronic combined systolic and diastolic CHF, NYHA class 1 (HCC) 05/02/2015  . Pain in the chest   . Chest pain 04/30/2015  . Pelvis fracture, closed, initial encounter 02/19/2015  . Distal radial epiphysitis 02/19/2015  . Hypercortisolemia (HCC) 10/03/2014  . UTI (urinary tract infection) 10/05/2013  . Atrial fibrillation with RVR (HCC) 10/04/2013  . Generalized weakness 10/04/2013  . Chronic combined systolic and diastolic CHF (congestive heart failure) (HCC) 10/20/2012  . CKD (chronic kidney disease) stage 3, GFR 30-59 ml/min (HCC) 10/20/2012  . Cardiomyopathy, dilated, nonischemic 10/14/2011  . CRT - pacemaker Medtronic 10/14/2011  . Hypokalemia 10/12/2011  . Acute renal insufficiency, Scr was WNL in April 2013 10/11/2011  . Anemia 10/11/2011  . Unstable angina (HCC) 10/11/2011  . Gait abnormality 05/13/2011  . Syncope 03/25/2011  . COPD on CXR 03/25/2011  . Coronary atherosclerosis, no significant obstructive lesions 03/25/2011  . Symptomatic advanced heart block, 2:1 AV block, RT BBB, Lt post. fas. block.(March 2013) 03/23/2011  . Paroxysmal atrial fibrillation (HCC) 03/23/2011  . Lung nodule 12/13/2010  . DM type 2 (diabetes mellitus, type 2) (HCC) 12/13/2010  . Hyperlipidemia 12/13/2010  . TOTAL KNEE FOLLOW-UP 06/20/2009  . MONONEURITIS, LEG 02/19/2009  . KNEE, ARTHRITIS, DEGEN./OSTEO 11/30/2008  . SPINAL STENOSIS 07/04/2008  . HEMARTHROSIS, LOWER LEG 05/10/2008  . Effusion of lower leg joint 05/03/2008  . KNEE PAIN 05/03/2008  . Hypertension 05/03/2008  Past Surgical History:  Procedure Laterality Date  . Bladder tack  1970's  . CARDIAC CATHETERIZATION  10/15/2011   Normal coronaries  . CARDIOVERSION N/A 10/28/2013   Procedure: CARDIOVERSION;  Surgeon: Thurmon Fair, MD;  Location: [facility] ENDOSCOPY;  Service: Cardiovascular;  Laterality: N/A;  . CARDIOVERSION N/A 08/20/2016   Procedure: CARDIOVERSION;  Surgeon: Thurmon Fair, MD;  Location: [facility]  ENDOSCOPY;  Service: Cardiovascular;  Laterality: N/A;  . CHOLECYSTECTOMY  1999  . INTRAMEDULLARY (IM) NAIL INTERTROCHANTERIC Right 11/03/2015   Procedure: INTRAMEDULLARY (IM) NAIL RIGHT INTERTROCHANTERIC HIP FRACTURE;  Surgeon: Tarry Kos, MD;  Location: [facility] OR;  Service: Orthopedics;  Laterality: Right;  . LEFT AND RIGHT HEART CATHETERIZATION WITH CORONARY ANGIOGRAM N/A 10/15/2011   Procedure: LEFT AND RIGHT HEART CATHETERIZATION WITH CORONARY ANGIOGRAM;  Surgeon: Chrystie Nose, MD;  Location: [facility] CATH LAB;  Service: Cardiovascular;  Laterality: N/A;  . NM MYOCAR PERF WALL MOTION  03/15/2009   Normal  . PACEMAKER INSERTION  10/16/2011   Medtronic  . PERMANENT PACEMAKER INSERTION N/A 03/24/2011   Procedure: PERMANENT PACEMAKER INSERTION;  Surgeon: Thurmon Fair, MD;  Location: [facility] CATH LAB;  Service: Cardiovascular;  Laterality: N/A;  . REPLACEMENT TOTAL KNEE       OB History   None      Home Medications    Prior to Admission medications   Medication Sig Start Date End Date Taking? Authorizing Provider  acetaminophen (TYLENOL) 325 MG tablet Take 650 mg by mouth every 4 (four) hours as needed for mild pain or moderate pain.     [provider]  amiodarone (PACERONE) 200 MG tablet Take 1 tablet (200 mg total) by mouth daily. 04/18/17   Philip Aspen, Limmie Patricia, MD  Coenzyme Q10 (CO Q 10 PO) Take 1 tablet by mouth daily.    [provider]  DM-Doxylamine-Acetaminophen (NYQUIL COLD & FLU PO) Take 1 capsule by mouth at bedtime as needed (sleep).    [provider]  ELIQUIS 2.5 MG TABS tablet TAKE ONE TABLET BY MOUTH TWICE DAILY. 10/26/17   Croitoru, Mihai, MD  ENTRESTO 24-26 MG TAKE (1) TABLET BY MOUTH TWICE DAILY. 11/20/17   Croitoru, Mihai, MD  ferrous sulfate 325 (65 FE) MG tablet Take 325 mg by mouth daily with breakfast.     [provider]  furosemide (LASIX) 20 MG tablet Take 3 tablets (60 mg total) by mouth daily. 04/18/17   Philip Aspen, Limmie Patricia, MD  metolazone (ZAROXOLYN) 2.5 MG tablet Take 1 tablet by mouth once or twice a week as needed. 04/27/17   Croitoru, Mihai, MD  metoprolol succinate (TOPROL-XL) 25 MG 24 hr tablet Take 3 tablets (75 mg total) by mouth 2 (two) times daily. 09/15/17   Croitoru, Mihai, MD  nitroGLYCERIN (NITROSTAT) 0.4 MG SL tablet DISSOLVE 1 TABLET UNDER TONGUE EVERY 5 MINUTES UP TO 15 MIN FOR CHESTPAIN. IF NO RELIEF CALL 911. 12/25/14   Croitoru, Mihai, MD  ondansetron (ZOFRAN) 4 MG tablet Take 4 mg by mouth every 4 (four) hours as needed for nausea or vomiting.    [provider]  polyethylene glycol (MIRALAX / GLYCOLAX) packet Take 17 g by mouth as needed.    [provider]  potassium chloride SA (K-DUR,KLOR-CON) 20 MEQ tablet Take 1 capsule (20 mEq) daily. Take an extra 1 capsule (20 mEq) on days you take an extra lasix. 08/22/16   [facility], Roe Rutherford, PA  benazepril (LOTENSIN) 40 MG tablet Take 40 mg by mouth 2 (two) times daily.  05/27/17  [provider]    Family History Family History  Problem Relation Age of Onset  . Suicidality Mother   . Heart murmur Daughter   . Heart attack Brother     Social History Social History   Tobacco Use  . Smoking status: Never Smoker  . Smokeless tobacco: Never Used  Substance Use Topics  . Alcohol use: No  . Drug use: No     Allergies   Coumadin [warfarin sodium]; Meloxicam; Metformin and related; Morphine; Nabumetone; Oxycodone; and Sulfonamide derivatives   Review of Systems Review of Systems  All other systems reviewed and are negative.    Physical Exam Updated Vital Signs BP (!) 144/74   Pulse 70   Temp (!) 97.5 F (36.4 C) (Oral)   Resp 14   Ht 5\' 7"  (1.702 m)   Wt 50.8 kg   SpO2 100%   BMI 17.54 kg/m   Physical Exam  Constitutional:  Frail, pleasant, slight confusion  HENT:  Head: Normocephalic and atraumatic.  Eyes: Conjunctivae are normal.  Neck: Neck supple.  Cardiovascular: Normal rate and regular  rhythm.  Pulmonary/Chest:  Slight tachypnea, scattered rhonchi  Abdominal: Soft. Bowel sounds are normal.  Musculoskeletal: Normal range of motion.  Neurological: She is alert.  Skin: Skin is warm and dry.  Psychiatric: She has a normal mood and affect. Her behavior is normal.  Nursing note and vitals reviewed.    ED Treatments / Results  Labs (all labs ordered are listed, but only abnormal results are displayed) Labs Reviewed  COMPREHENSIVE METABOLIC PANEL - Abnormal; Notable for the following components:      Result Value   Sodium 125 (*)    Chloride 78 (*)    CO2 35 (*)    Glucose, Bld 280 (*)    BUN 30 (*)    Creatinine, Ser 1.12 (*)    Albumin 3.3 (*)    Alkaline Phosphatase 131 (*)    GFR calc non Af Amer 43 (*)    GFR calc Af Amer 50 (*)    All other components within normal limits  CBC - Abnormal; Notable for the following components:   WBC 11.6 (*)    RBC 2.72 (*)    Hemoglobin 8.8 (*)    HCT 28.4 (*)    MCV 104.4 (*)    All other components within normal limits  URINALYSIS, ROUTINE W REFLEX MICROSCOPIC - Abnormal; Notable for the following components:   Color, Urine AMBER (*)    APPearance CLOUDY (*)    Leukocytes, UA LARGE (*)    WBC, UA >50 (*)    Bacteria, UA MANY (*)    Non Squamous Epithelial 0-5 (*)    All other components within normal limits  CBG MONITORING, ED - Abnormal; Notable for the following components:   Glucose-Capillary 278 (*)    All other components within normal limits  URINE CULTURE    EKG None  Radiology Dg Chest 2 View  Result Date: 12/23/2017 CLINICAL DATA:  Cough EXAM: CHEST - 2 VIEW COMPARISON:  10/09/2017 FINDINGS: Cardiac shadow is enlarged. Pacing device is again seen and stable. Aortic calcifications are seen. Patchy infiltrative changes are noted increased from the prior exam particularly in the right apex and right lung base. Central vascular congestion is noted without significant interstitial edema. Small effusions  are noted. Changes of bronchiectasis along the major and minor fissure are again noted similar to that seen on prior CT examination. No acute bony abnormality is  noted. IMPRESSION: Patchy acute infiltrate particularly in the right upper lobe and right lung base when compared with the prior exam. Increased central vascular congestion is noted. Stable bronchiectasis and scarring along the minor fissure on the right in the major fissure on the left. Electronically Signed   By: Alcide Clever M.D.   On: 12/23/2017 11:53    Procedures Procedures (including critical care time)  Medications Ordered in ED Medications  0.9 %  sodium chloride infusion (has no administration in time range)  azithromycin (ZITHROMAX) 500 mg in sodium chloride 0.9 % 250 mL IVPB (500 mg Intravenous New Bag/Given 12/23/17 1310)  sodium chloride 0.9 % bolus 500 mL (500 mLs Intravenous New Bag/Given 12/23/17 1118)  cefTRIAXone (ROCEPHIN) 1 g in sodium chloride 0.9 % 100 mL IVPB (0 g Intravenous Stopped 12/23/17 1205)     Initial Impression / Assessment and Plan / ED Course  I have reviewed the triage vital signs and the nursing notes.  Pertinent labs & imaging results that were available during my care of the patient were reviewed by me and considered in my medical decision making (see chart for details).     Patient presents with altered mental status and cough.  She appears dehydrated.  All signs are stable.  Chest x-ray reveals a right upper lobe pneumonia.  Urinalysis infected.  Her sodium and chloride are suboptimal.  Start IV fluids, IV Rocephin, IV Zithromax, urine culture, admit to general medicine.   CRITICAL CARE Performed by: Donnetta Hutching Total critical care time: 30 minutes Critical care time was exclusive of separately billable procedures and treating other patients. Critical care was necessary to treat or prevent imminent or life-threatening deterioration. Critical care was time spent personally by me on the  following activities: development of treatment plan with patient and/or surrogate as well as nursing, discussions with consultants, evaluation of patient's response to treatment, examination of patient, obtaining history from patient or surrogate, ordering and performing treatments and interventions, ordering and review of laboratory studies, ordering and review of radiographic studies, pulse oximetry and re-evaluation of patient's condition.  Final Clinical Impressions(s) / ED Diagnoses   Final diagnoses:  Altered mental status, unspecified altered mental status type  Community acquired pneumonia of right upper lobe of lung (HCC)  Urinary tract infection without hematuria, site unspecified    ED Discharge Orders    None       Donnetta Hutching, MD 12/23/17 1316

## 2017-12-23 NOTE — ED Notes (Signed)
Iv attempt X2 unsuccessful

## 2017-12-24 DIAGNOSIS — E440 Moderate protein-calorie malnutrition: Secondary | ICD-10-CM | POA: Diagnosis not present

## 2017-12-24 DIAGNOSIS — J9611 Chronic respiratory failure with hypoxia: Secondary | ICD-10-CM | POA: Diagnosis not present

## 2017-12-24 DIAGNOSIS — I5042 Chronic combined systolic (congestive) and diastolic (congestive) heart failure: Secondary | ICD-10-CM | POA: Diagnosis not present

## 2017-12-24 DIAGNOSIS — E1122 Type 2 diabetes mellitus with diabetic chronic kidney disease: Secondary | ICD-10-CM | POA: Diagnosis not present

## 2017-12-24 DIAGNOSIS — I441 Atrioventricular block, second degree: Secondary | ICD-10-CM | POA: Diagnosis not present

## 2017-12-24 DIAGNOSIS — G9341 Metabolic encephalopathy: Secondary | ICD-10-CM | POA: Diagnosis not present

## 2017-12-24 DIAGNOSIS — Z950 Presence of cardiac pacemaker: Secondary | ICD-10-CM | POA: Diagnosis not present

## 2017-12-24 DIAGNOSIS — I480 Paroxysmal atrial fibrillation: Secondary | ICD-10-CM | POA: Diagnosis not present

## 2017-12-24 DIAGNOSIS — I2510 Atherosclerotic heart disease of native coronary artery without angina pectoris: Secondary | ICD-10-CM | POA: Diagnosis not present

## 2017-12-24 DIAGNOSIS — J181 Lobar pneumonia, unspecified organism: Secondary | ICD-10-CM | POA: Diagnosis not present

## 2017-12-24 DIAGNOSIS — N183 Chronic kidney disease, stage 3 (moderate): Secondary | ICD-10-CM | POA: Diagnosis not present

## 2017-12-24 DIAGNOSIS — I10 Essential (primary) hypertension: Secondary | ICD-10-CM | POA: Diagnosis not present

## 2017-12-24 LAB — CBC
HCT: 25.7 % — ABNORMAL LOW (ref 36.0–46.0)
Hemoglobin: 8.1 g/dL — ABNORMAL LOW (ref 12.0–15.0)
MCH: 32.1 pg (ref 26.0–34.0)
MCHC: 31.5 g/dL (ref 30.0–36.0)
MCV: 102 fL — ABNORMAL HIGH (ref 80.0–100.0)
NRBC: 0 % (ref 0.0–0.2)
Platelets: 326 10*3/uL (ref 150–400)
RBC: 2.52 MIL/uL — AB (ref 3.87–5.11)
RDW: 14.4 % (ref 11.5–15.5)
WBC: 12.6 10*3/uL — ABNORMAL HIGH (ref 4.0–10.5)

## 2017-12-24 LAB — GLUCOSE, CAPILLARY
Glucose-Capillary: 138 mg/dL — ABNORMAL HIGH (ref 70–99)
Glucose-Capillary: 122 mg/dL — ABNORMAL HIGH (ref 70–99)

## 2017-12-24 LAB — BASIC METABOLIC PANEL
ANION GAP: 12 (ref 5–15)
BUN: 31 mg/dL — ABNORMAL HIGH (ref 8–23)
CHLORIDE: 82 mmol/L — AB (ref 98–111)
CO2: 35 mmol/L — AB (ref 22–32)
Calcium: 8.5 mg/dL — ABNORMAL LOW (ref 8.9–10.3)
Creatinine, Ser: 1.22 mg/dL — ABNORMAL HIGH (ref 0.44–1.00)
GFR calc Af Amer: 45 mL/min — ABNORMAL LOW (ref >60)
GFR calc non Af Amer: 39 mL/min — ABNORMAL LOW (ref >60)
Glucose, Bld: 126 mg/dL — ABNORMAL HIGH (ref 70–99)
Potassium: 3.9 mmol/L (ref 3.5–5.1)
Sodium: 129 mmol/L — ABNORMAL LOW (ref 135–145)

## 2017-12-24 MED ORDER — CEFDINIR 300 MG PO CAPS: 300.0000 mg | ORAL_CAPSULE | Freq: Every day | ORAL | 0 refills | Status: AC

## 2017-12-24 MED ORDER — CEFDINIR 300 MG PO CAPS
300.0000 mg | ORAL_CAPSULE | Freq: Every day | ORAL | Status: DC
  Administered 2017-12-24: 300 mg via ORAL
  Filled 2017-12-24: qty 1

## 2017-12-24 MED ORDER — DOXYCYCLINE HYCLATE 100 MG PO TABS
100.0000 mg | ORAL_TABLET | Freq: Two times a day (BID) | ORAL | Status: DC
  Administered 2017-12-24: 100 mg via ORAL
  Filled 2017-12-24: qty 1

## 2017-12-24 MED ORDER — DOXYCYCLINE HYCLATE 100 MG PO TABS: 100.0000 mg | ORAL_TABLET | Freq: Two times a day (BID) | ORAL | 0 refills | Status: AC

## 2017-12-24 NOTE — Plan of Care (Signed)
  Problem: Education: Goal: Knowledge of General Education information will improve Description Including pain rating scale, medication(s)/side effects and non-pharmacologic comfort measures 12/24/2017 1441 by Darreld Mclean, RN Outcome: Adequate for Discharge 12/24/2017 0956 by Darreld Mclean, RN Outcome: Progressing   Problem: Health Behavior/Discharge Planning: Goal: Ability to manage health-related needs will improve 12/24/2017 1441 by Darreld Mclean, RN Outcome: Adequate for Discharge 12/24/2017 0956 by Darreld Mclean, RN Outcome: Progressing   Problem: Clinical Measurements: Goal: Ability to maintain clinical measurements within normal limits will improve 12/24/2017 1441 by Darreld Mclean, RN Outcome: Adequate for Discharge 12/24/2017 0956 by Darreld Mclean, RN Outcome: Progressing Goal: Will remain free from infection 12/24/2017 1441 by Darreld Mclean, RN Outcome: Adequate for Discharge 12/24/2017 0956 by Darreld Mclean, RN Outcome: Progressing Goal: Diagnostic test results will improve 12/24/2017 1441 by Darreld Mclean, RN Outcome: Adequate for Discharge 12/24/2017 0956 by Darreld Mclean, RN Outcome: Progressing Goal: Respiratory complications will improve 12/24/2017 1441 by Darreld Mclean, RN Outcome: Adequate for Discharge 12/24/2017 0956 by Darreld Mclean, RN Outcome: Progressing Goal: Cardiovascular complication will be avoided 12/24/2017 1441 by Darreld Mclean, RN Outcome: Adequate for Discharge 12/24/2017 0956 by Darreld Mclean, RN Outcome: Progressing   Problem: Activity: Goal: Risk for activity intolerance will decrease 12/24/2017 1441 by Darreld Mclean, RN Outcome: Adequate for Discharge 12/24/2017 0956 by Darreld Mclean, RN Outcome: Progressing   Problem: Nutrition: Goal: Adequate nutrition will be maintained 12/24/2017 1441 by Darreld Mclean, RN Outcome: Adequate for Discharge 12/24/2017 0956 by Darreld Mclean, RN Outcome: Progressing   Problem: Coping: Goal: Level of anxiety will decrease 12/24/2017 1441 by Darreld Mclean,  RN Outcome: Adequate for Discharge 12/24/2017 0956 by Darreld Mclean, RN Outcome: Progressing   Problem: Elimination: Goal: Will not experience complications related to bowel motility 12/24/2017 1441 by Darreld Mclean, RN Outcome: Adequate for Discharge 12/24/2017 0956 by Darreld Mclean, RN Outcome: Progressing Goal: Will not experience complications related to urinary retention 12/24/2017 1441 by Darreld Mclean, RN Outcome: Adequate for Discharge 12/24/2017 0956 by Darreld Mclean, RN Outcome: Progressing   Problem: Pain Managment: Goal: General experience of comfort will improve 12/24/2017 1441 by Darreld Mclean, RN Outcome: Adequate for Discharge 12/24/2017 0956 by Darreld Mclean, RN Outcome: Progressing   Problem: Safety: Goal: Ability to remain free from injury will improve 12/24/2017 1441 by Darreld Mclean, RN Outcome: Adequate for Discharge 12/24/2017 0956 by Darreld Mclean, RN Outcome: Progressing   Problem: Skin Integrity: Goal: Risk for impaired skin integrity will decrease 12/24/2017 1441 by Darreld Mclean, RN Outcome: Adequate for Discharge 12/24/2017 0956 by Darreld Mclean, RN Outcome: Progressing

## 2017-12-24 NOTE — Care Management Obs Status (Signed)
MEDICARE OBSERVATION STATUS NOTIFICATION   Patient Details  Name: Kathleen [name] MRN: [id number] Date of Birth: 25-May-1924   Medicare Observation Status Notification Given:  Yes    Lyric Rossano, Chrystine Oiler, RN 12/24/2017, 7:48 AM

## 2017-12-24 NOTE — Discharge Summary (Signed)
Physician Discharge Summary  Kathleen [name] IOM:[id number] DOB: 05/28/1924 DOA: 12/23/2017  PCP: Redmond School, MD  Admit date: 12/23/2017 Discharge date: 12/24/2017  Admitted From: Home Disposition: Home   Recommendations for Outpatient Follow-up:  1. Follow up with PCP in 1-2 weeks. Consider antihyperglycemic treatment in light of HbA1c 7.5%, though this was not started at discharge. 2. Please obtain BMP/CBC in one week 3. Please follow up on the following pending results: Urine culture  Home Health: PT Equipment/Devices: None new Discharge Condition: Stable CODE STATUS: DNR confirmed Diet recommendation: Heart healthy, carb-modified  Brief/Interim Summary: Kathleen [name] is a 84 y.o. 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   Due to current encephalopathy, history was primarily gleaned from daughter with ROS performed based on current symptoms reported or denied by patient. Her daughter reports 4 days of waxing/waning mild confusion, seeing things and people that are not there. This increased in severity to last night when she reported seeing a man and three women that were at her bedside. She felt threatened which was improved with her daughter, with whom she lives, sleeping in her room. She has not been sleeping at night and began having some coughing but had no specific complaints. She's been on 3L O2 at home, unchanged recently.   The patient was afebrile, VSS, 100% on 3LPM O2, urinalysis strongly suggestive of UTI, culture sent. CXR demonstrating bronchiectasis/scarring with acute findings of right-sided opacities consistent with pneumonia. Abnormal metabolic panel including Na 125, Cl 78, bicarb 35, BUN 30, Cr. 1.12, and glucose 280. LFTs essentially normal with alk phos 131. WBC  11.6, hgb 8.8, and ECG showed AV paced rhythm. Ceftriaxone and azithromycin and 500cc NS given, hospitalists called for admission due to persistent encephalopathy due to PNA and UTI. Antibiotics were continued with resolution in acute encephalopathy based on daughter's report. She's been afebrile, taking oral medications, and remains hemodynamically stable for discharge. She's at her functional baseline, but would benefit from home health physical therapy which is arranged prior to discharge.   Discharge Diagnoses:  Principal Problem:   Acute metabolic encephalopathy Active Problems:   Hypertension   DM type 2 (diabetes mellitus, type 2) (HCC)   Symptomatic advanced heart block, 2:1 AV block, RT BBB, Lt post. fas. block.(March 2013)   Paroxysmal atrial fibrillation (HCC)   Coronary atherosclerosis, no significant obstructive lesions   CRT - pacemaker [employer]   Chronic combined systolic and diastolic CHF (congestive heart failure) (HCC)   CKD (chronic kidney disease) stage 3, GFR 30-59 ml/min (HCC)   UTI (urinary tract infection)   Malnutrition of moderate degree   Right upper lobe pneumonia (HCC)   Chronic respiratory failure with hypoxia (HCC)  Acute metabolic encephalopathy: Suspected to be due to infection (not sepsis) including right upper lobe and lower lobe pneumonia and UTI, as well as hyponatremia. - Delirium precautions reviewed with daughter who remained at bedside. The patient slept well with normal day-night rhythm during observation period, a significant change from before. A trial of trazodone qHS seemed successful.  - Treat infections with ceftriaxone and doxycycline (azithromycin stopped due to QT prolongation). Will convert to cefdinir and doxycycline.  - Urine culture pending at this time. Historic culture data reviewed. Had klebsiella resistant to ampicillin and intermediate to macrobid on 2 cultures in 2017,  E. aerogenes also noted in 2013 with resistance to ancef and  macrobid. Due to this resistance, a third generation cephalosporin is preferred with no culture data to go on.   - Monitor sodium level at follow up.  Stage IV CKD: CrCl currently 88m/min (Cr deceptively low due to sarcopenia).  - Avoid nephrotoxins - Monitor at follow up - Held diuretic for today. Will restart tomorrow. Appears euvolemic  Chronic combined HFrEF, pulmonary HTN: Most recent echo with EF 25-30%, G3DD, PASP 43. Euvolemic. - Hold entresto for now with elevated creatinine, infection.  Chronic hypoxic respiratory failure:  - Continue supplemental oxygen (at baseline)  Anemia of chronic disease:  - Monitor CBC at follow up, 8.1g/dl at discharge. No bleeding noted. Consider ESA.  PAF, 2nd deg AVB s/p PPM: Recent interrogation showed normal function and pacemaker dependence.  - Continue home amiodarone, eliquis (low dose)  HTN:  - Continue home medications  T2DM: Not on regular medications, but hyperglycemic here and HbA1c 7.5% - Follow up with PCP advised.  Discharge Instructions Discharge Instructions    (HEART FAILURE PATIENTS) Call MD:  Anytime you have any of the following symptoms: 1) 3 pound weight gain in 24 hours or 5 pounds in 1 week 2) shortness of breath, with or without a dry hacking cough 3) swelling in the hands, feet or stomach 4) if you have to sleep on extra pillows at night in order to breathe.   Complete by:  As directed    Diet - low sodium heart healthy   Complete by:  As directed    Diet Carb Modified   Complete by:  As directed    Discharge instructions   Complete by:  As directed    Take cefdinir once daily for 9 more days and doxycycline twice daily for 6 more days to treat your pneumonia and UTI. I will contact you if the urine culture results indicate that we need to change the antibiotics. Home health physical therapy will be arranged for you prior to discharge. If your symptoms return, seek medical attention. Otherwise, call your PCP  for follow up in the next 1-2 weeks with repeat blood work.   Increase activity slowly   Complete by:  As directed      Allergies as of 12/24/2017      Reactions   Coumadin [warfarin Sodium] Other (See Comments)   Caused Patient to Bleed Out.    Meloxicam Other (See Comments)   Unknown   Metformin And Related Other (See Comments)   Cannot have due to kidney function   Morphine Itching   Not in right state of mind.    Nabumetone Nausea And Vomiting   Oxycodone Nausea And Vomiting   Sulfonamide Derivatives Hives      Medication List    TAKE these medications   acetaminophen 325 MG tablet Commonly known as:  TYLENOL Take 650 mg by mouth every 4 (four) hours as needed for mild pain or moderate pain.   amiodarone 200 MG tablet Commonly known as:  PACERONE Take 1 tablet (200 mg total) by mouth daily.   cefdinir 300 MG capsule Commonly known as:  OMNICEF Take 1 capsule (300 mg total) by mouth daily.   CO Q 10 PO Take 1 tablet by mouth daily.   doxycycline 100 MG tablet Commonly known as:  VIBRA-TABS Take 1 tablet (100 mg total) by mouth every 12 (twelve) hours.   ELIQUIS 2.5 MG Tabs tablet Generic drug:  apixaban TAKE ONE TABLET  BY MOUTH TWICE DAILY.   ENTRESTO 24-26 MG Generic drug:  sacubitril-valsartan TAKE (1) TABLET BY MOUTH TWICE DAILY.   ferrous sulfate 325 (65 FE) MG tablet Take 325 mg by mouth daily with breakfast.   furosemide 20 MG tablet Commonly known as:  LASIX Take 3 tablets (60 mg total) by mouth daily.   metolazone 2.5 MG tablet Commonly known as:  ZAROXOLYN Take 1 tablet by mouth once or twice a week as needed.   metoprolol succinate 25 MG 24 hr tablet Commonly known as:  TOPROL-XL Take 3 tablets (75 mg total) by mouth 2 (two) times daily.   nitroGLYCERIN 0.4 MG SL tablet Commonly known as:  NITROSTAT DISSOLVE 1 TABLET UNDER TONGUE EVERY 5 MINUTES UP TO 15 MIN FOR CHESTPAIN. IF NO RELIEF CALL 911.   NYQUIL COLD & FLU PO Take 1 capsule  by mouth at bedtime as needed (sleep).   ondansetron 4 MG tablet Commonly known as:  ZOFRAN Take 4 mg by mouth every 4 (four) hours as needed for nausea or vomiting.   polyethylene glycol packet Commonly known as:  MIRALAX / GLYCOLAX Take 17 g by mouth as needed.   potassium chloride SA 20 MEQ tablet Commonly known as:  K-DUR,KLOR-CON Take 1 capsule (20 mEq) daily. Take an extra 1 capsule (20 mEq) on days you take an extra lasix.       Allergies  Allergen Reactions  . Coumadin [Warfarin Sodium] Other (See Comments)    Caused Patient to Bleed Out.   . Meloxicam Other (See Comments)    Unknown  . Metformin And Related Other (See Comments)    Cannot have due to kidney function  . Morphine Itching    Not in right state of mind.   . Nabumetone Nausea And Vomiting  . Oxycodone Nausea And Vomiting  . Sulfonamide Derivatives Hives    Consultations:  Pharmacy  Procedures/Studies: Dg Chest 2 View  Result Date: 12/23/2017 CLINICAL DATA:  Cough EXAM: CHEST - 2 VIEW COMPARISON:  10/09/2017 FINDINGS: Cardiac shadow is enlarged. Pacing device is again seen and stable. Aortic calcifications are seen. Patchy infiltrative changes are noted increased from the prior exam particularly in the right apex and right lung base. Central vascular congestion is noted without significant interstitial edema. Small effusions are noted. Changes of bronchiectasis along the major and minor fissure are again noted similar to that seen on prior CT examination. No acute bony abnormality is noted. IMPRESSION: Patchy acute infiltrate particularly in the right upper lobe and right lung base when compared with the prior exam. Increased central vascular congestion is noted. Stable bronchiectasis and scarring along the minor fissure on the right in the major fissure on the left. Electronically Signed   By: Inez Catalina M.D.   On: 12/23/2017 11:53     Subjective: Slept very well and is alert this afternoon, no  hallucinations or delusions at time of encounter or earlier per daughter at bedside. Feels she's at her baseline functionally and mentally. No fever, chills, dysuria, chest pain, dyspnea. Has some cough which is stable. No hemoptysis or other bleeding noted.   Discharge Exam: Vitals:   12/24/17 0555 12/24/17 1318  BP: 136/67 122/77  Pulse: 71 79  Resp: 20 19  Temp: 98.3 F (36.8 C) 98.6 F (37 C)  SpO2: 91% 100%   General: Elderly female in no distress Cardiovascular: RRR, S1/S2 +, no rubs, no gallops Respiratory: Nonlabored and clear Abdominal: Soft, NT, ND, bowel sounds + Extremities: No edema,  no cyanosis  Labs: BNP (last 3 results) Recent Labs    04/11/17 1221 04/15/17 0944 10/09/17 1239  BNP 2,655.0* 2,186.0* 272.5*   Basic Metabolic Panel: Recent Labs  Lab 12/23/17 1022 12/24/17 0421  NA 125* 129*  K 4.4 3.9  CL 78* 82*  CO2 35* 35*  GLUCOSE 280* 126*  BUN 30* 31*  CREATININE 1.12* 1.22*  CALCIUM 8.9 8.5*   Liver Function Tests: Recent Labs  Lab 12/23/17 1022  AST 35  ALT 34  ALKPHOS 131*  BILITOT 0.9  PROT 7.2  ALBUMIN 3.3*   No results for input(s): LIPASE, AMYLASE in the last 168 hours. No results for input(s): AMMONIA in the last 168 hours. CBC: Recent Labs  Lab 12/23/17 1022 12/24/17 0421  WBC 11.6* 12.6*  HGB 8.8* 8.1*  HCT 28.4* 25.7*  MCV 104.4* 102.0*  PLT 376 326   Cardiac Enzymes: No results for input(s): CKTOTAL, CKMB, CKMBINDEX, TROPONINI in the last 168 hours. BNP: Invalid input(s): POCBNP CBG: Recent Labs  Lab 12/23/17 1011 12/23/17 1642 12/23/17 2138 12/24/17 0717 12/24/17 1054  GLUCAP 278* 195* 136* 138* 122*   D-Dimer No results for input(s): DDIMER in the last 72 hours. Hgb A1c Recent Labs    12/23/17 1628  HGBA1C 7.5*   Lipid Profile No results for input(s): CHOL, HDL, LDLCALC, TRIG, CHOLHDL, LDLDIRECT in the last 72 hours. Thyroid function studies Recent Labs    12/23/17 1628  TSH 3.719   Anemia  work up Recent Labs    12/23/17 1628  VITAMINB12 1,248*   Urinalysis    Component Value Date/Time   COLORURINE AMBER (A) 12/23/2017 1006   APPEARANCEUR CLOUDY (A) 12/23/2017 1006   LABSPEC 1.014 12/23/2017 1006   PHURINE 6.0 12/23/2017 1006   GLUCOSEU NEGATIVE 12/23/2017 1006   HGBUR NEGATIVE 12/23/2017 1006   BILIRUBINUR NEGATIVE 12/23/2017 1006   KETONESUR NEGATIVE 12/23/2017 1006   PROTEINUR NEGATIVE 12/23/2017 1006   UROBILINOGEN 0.2 10/16/2013 0910   NITRITE NEGATIVE 12/23/2017 1006   LEUKOCYTESUR LARGE (A) 12/23/2017 1006    Microbiology No results found for this or any previous visit (from the past 240 hour(s)).  Time coordinating discharge: Approximately 40 minutes  Patrecia Pour, MD  [facility] 12/24/2017, 2:40 PM Pager [phone]

## 2017-12-24 NOTE — Care Management Note (Addendum)
Case Management Note  Patient Details  Name: Kathleen [name] MRN: [id number] Date of Birth: 12-Jan-1925  Subjective/Objective:     UTI, Pneumonia. From home with daughter. Patient has RW with seat, rolling walker with 2 wheels, WC, BSC and continuous oxygen (3.5 liters with [facility]).  Has PCP. Daughter takes patient to all appointments. She reports being unclear about Sherryll Burger, saying [facility] has not received prescriptions to refill, she is unsure if patient is still supposed to be taking Entresto. Daughter plans to call cardiology office today to follow up. Seen by PT, patient is at her baseline, recommended for home health. Daughter prefers this also.  Has had [facility] in the past and would like again.            Action/Plan: Dc home with home health. Bonita Quin of [facility] notified and will obtain orders via Epic.  Expected Discharge Date:     12/24/2017             Expected Discharge Plan:   home with home health  In-House Referral:     Discharge planning Services   CM consult  Post Acute Care Choice:   home health Choice offered to:   patient, adult children  DME Arranged:    DME Agency:     HH Arranged:   home health PT [facility] Agency:   [facility]  Status of Service:   Completed.   If discussed at Long Length of Stay Meetings, dates discussed:    Additional Comments:  Zaria Taha, Chrystine Oiler, RN 12/24/2017, 12:00 PM

## 2017-12-24 NOTE — Plan of Care (Signed)
  Problem: Acute Rehab PT Goals(only PT should resolve) Goal: Patient Will Transfer Sit To/From Stand Flowsheets (Taken 12/24/2017 1355) Patient will transfer sit to/from stand: with supervision Goal: Pt Will Transfer Bed To Chair/Chair To Bed Flowsheets (Taken 12/24/2017 1355) Pt will Transfer Bed to Chair/Chair to Bed: with supervision Goal: Pt Will Ambulate Flowsheets (Taken 12/24/2017 1355) Pt will Ambulate: 15 feet; with min guard assist   1:56 PM, 12/24/17 Ocie Bob, MPT Physical Therapist with [facility] [phone] office [phone] mobile phone

## 2017-12-24 NOTE — Plan of Care (Signed)

## 2017-12-24 NOTE — Evaluation (Signed)
Physical Therapy Evaluation Patient Details Name: Kathleen [name] MRN: [id number] DOB: [birth year] Today's Date: 12/24/2017   History of Present Illness  Kathleen [name] is a 84 y.o. 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     Clinical Impression  Patient functioning near baseline for functional mobility and gait, incontinent of urine and stool upon standing, able to take few labored steps to transfer to [facility], then later to chair, limited to ambulation at bedside due to c/o fatigue and tolerated sitting up in chair after.  Patient will benefit from continued physical therapy in hospital and recommended venue below to increase strength, balance, endurance for safe ADLs and gait.    Follow Up Recommendations Home health PT;Supervision for mobility/OOB;Supervision - Intermittent    Equipment Recommendations  None recommended by PT    Recommendations for Other Services       Precautions / Restrictions Precautions Precautions: Fall Restrictions Weight Bearing Restrictions: No      Mobility  Bed Mobility Overal bed mobility: Modified Independent             General bed mobility comments: increased time, used bed rail  Transfers Overall transfer level: Needs assistance Equipment used: Rolling walker (2 wheeled) Transfers: Sit to/from [employer] Sit to Stand: Min guard Stand pivot transfers: Min guard       General transfer comment: slow labored movement  Ambulation/Gait Ambulation/Gait assistance: Min assist Gait Distance (Feet): 5 Feet Assistive device: Rolling walker (2 wheeled) Gait Pattern/deviations: Decreased step length - left;Decreased stance time - left Gait velocity: slow   General Gait Details: limited to 7-8 slow slightly labored steps at bedside  to transfer to [facility] and chair, no loss of balance, incontinent of bladder and bowel when standing  Stairs            Wheelchair Mobility    Modified Rankin (Stroke Patients Only)       Balance Overall balance assessment: Needs assistance Sitting-balance support: Feet supported;No upper extremity supported Sitting balance-Leahy Scale: Good     Standing balance support: Bilateral upper extremity supported;During functional activity Standing balance-Leahy Scale: Fair Standing balance comment: using RW                             Pertinent Vitals/Pain Pain Assessment: No/denies pain    Home Living Family/patient expects to be discharged to:: Private residence Living Arrangements: Children   Type of Home: House Home Access: Ramped entrance     Home Layout: One level Home Equipment: Shower seat - built in;Walker - 4 wheels;Walker - 2 wheels;Wheelchair - manual;Cane - single point      Prior Function Level of Independence: Needs assistance   Gait / Transfers Assistance Needed: short distanced household ambulator with assistance  ADL's / Homemaking Assistance Needed: assisted by daughter        Hand Dominance        Extremity/Trunk Assessment   Upper Extremity Assessment Upper Extremity Assessment: Generalized weakness    Lower Extremity Assessment Lower Extremity Assessment: Generalized weakness    Cervical / Trunk Assessment Cervical / Trunk Assessment: Normal  Communication   Communication: No difficulties  Cognition Arousal/Alertness: Awake/alert Behavior During Therapy: WFL for tasks assessed/performed Overall Cognitive Status: Within Functional Limits for tasks assessed  General Comments      Exercises     Assessment/Plan    PT Assessment Patient needs continued PT services  PT Problem List Decreased strength;Decreased activity tolerance;Decreased balance;Decreased  mobility       PT Treatment Interventions Gait training;Stair training;Functional mobility training;Therapeutic activities;Therapeutic exercise;Patient/family education    PT Goals (Current goals can be found in the Care Plan section)  Acute Rehab PT Goals Patient Stated Goal: return home with family to assist PT Goal Formulation: With patient/family Time For Goal Achievement: 12/31/17 Potential to Achieve Goals: Good    Frequency Min 3X/week   Barriers to discharge        Co-evaluation               AM-PAC PT "6 Clicks" Mobility  Outcome Measure Help needed turning from your back to your side while in a flat bed without using bedrails?: None Help needed moving from lying on your back to sitting on the side of a flat bed without using bedrails?: Total Help needed moving to and from a bed to a chair (including a wheelchair)?: Total Help needed standing up from a chair using your arms (e.g., wheelchair or bedside chair)?: A Little Help needed to walk in hospital room?: A Lot Help needed climbing 3-5 steps with a railing? : A Lot 6 Click Score: 13    End of Session Equipment Utilized During Treatment: Gait belt Activity Tolerance: Patient tolerated treatment well;Patient limited by fatigue Patient left: in chair;with call bell/phone within reach;with family/visitor present Nurse Communication: Mobility status PT Visit Diagnosis: Unsteadiness on feet (R26.81);Other abnormalities of gait and mobility (R26.89);Muscle weakness (generalized) (M62.81)    Time: 9604-5409 PT Time Calculation (min) (ACUTE ONLY): 32 min   Charges:   PT Evaluation $PT Eval Moderate Complexity: 1 Mod PT Treatments $Therapeutic Activity: 23-37 mins        1:53 PM, 12/24/17 Ocie Bob, MPT Physical Therapist with [facility] [phone] office [phone] mobile phone

## 2017-12-25 DIAGNOSIS — E440 Moderate protein-calorie malnutrition: Secondary | ICD-10-CM | POA: Diagnosis not present

## 2017-12-25 DIAGNOSIS — J9611 Chronic respiratory failure with hypoxia: Secondary | ICD-10-CM | POA: Diagnosis not present

## 2017-12-25 DIAGNOSIS — N390 Urinary tract infection, site not specified: Secondary | ICD-10-CM | POA: Diagnosis not present

## 2017-12-25 DIAGNOSIS — I130 Hypertensive heart and chronic kidney disease with heart failure and stage 1 through stage 4 chronic kidney disease, or unspecified chronic kidney disease: Secondary | ICD-10-CM | POA: Diagnosis not present

## 2017-12-25 DIAGNOSIS — E871 Hypo-osmolality and hyponatremia: Secondary | ICD-10-CM | POA: Diagnosis not present

## 2017-12-25 DIAGNOSIS — Z7901 Long term (current) use of anticoagulants: Secondary | ICD-10-CM | POA: Diagnosis not present

## 2017-12-25 DIAGNOSIS — E1151 Type 2 diabetes mellitus with diabetic peripheral angiopathy without gangrene: Secondary | ICD-10-CM | POA: Diagnosis not present

## 2017-12-25 DIAGNOSIS — D638 Anemia in other chronic diseases classified elsewhere: Secondary | ICD-10-CM | POA: Diagnosis not present

## 2017-12-25 DIAGNOSIS — I5042 Chronic combined systolic (congestive) and diastolic (congestive) heart failure: Secondary | ICD-10-CM | POA: Diagnosis not present

## 2017-12-25 DIAGNOSIS — I2510 Atherosclerotic heart disease of native coronary artery without angina pectoris: Secondary | ICD-10-CM | POA: Diagnosis not present

## 2017-12-25 DIAGNOSIS — I480 Paroxysmal atrial fibrillation: Secondary | ICD-10-CM | POA: Diagnosis not present

## 2017-12-25 DIAGNOSIS — E1122 Type 2 diabetes mellitus with diabetic chronic kidney disease: Secondary | ICD-10-CM | POA: Diagnosis not present

## 2017-12-25 DIAGNOSIS — N183 Chronic kidney disease, stage 3 (moderate): Secondary | ICD-10-CM | POA: Diagnosis not present

## 2017-12-25 DIAGNOSIS — J189 Pneumonia, unspecified organism: Secondary | ICD-10-CM | POA: Diagnosis not present

## 2017-12-25 DIAGNOSIS — Z950 Presence of cardiac pacemaker: Secondary | ICD-10-CM | POA: Diagnosis not present

## 2017-12-25 DIAGNOSIS — I872 Venous insufficiency (chronic) (peripheral): Secondary | ICD-10-CM | POA: Diagnosis not present

## 2017-12-25 DIAGNOSIS — Z9981 Dependence on supplemental oxygen: Secondary | ICD-10-CM | POA: Diagnosis not present

## 2017-12-25 DIAGNOSIS — Z79891 Long term (current) use of opiate analgesic: Secondary | ICD-10-CM | POA: Diagnosis not present

## 2017-12-25 LAB — URINE CULTURE: Culture: NO GROWTH

## 2017-12-29 ENCOUNTER — Emergency Department (HOSPITAL_COMMUNITY): Payer: PPO

## 2017-12-29 ENCOUNTER — Emergency Department (HOSPITAL_COMMUNITY)
Admission: EM | Admit: 2017-12-29 | Discharge: 2017-12-29 | Disposition: A | Discharge status: Home / Self Care | Payer: PPO | Attending: Emergency Medicine | Admitting: Emergency Medicine

## 2017-12-29 ENCOUNTER — Encounter (HOSPITAL_COMMUNITY): Payer: Self-pay

## 2017-12-29 ENCOUNTER — Other Ambulatory Visit: Payer: Self-pay

## 2017-12-29 DIAGNOSIS — E1122 Type 2 diabetes mellitus with diabetic chronic kidney disease: Secondary | ICD-10-CM | POA: Diagnosis not present

## 2017-12-29 DIAGNOSIS — W19XXXA Unspecified fall, initial encounter: Secondary | ICD-10-CM | POA: Diagnosis not present

## 2017-12-29 DIAGNOSIS — I5042 Chronic combined systolic (congestive) and diastolic (congestive) heart failure: Secondary | ICD-10-CM | POA: Insufficient documentation

## 2017-12-29 DIAGNOSIS — I2510 Atherosclerotic heart disease of native coronary artery without angina pectoris: Secondary | ICD-10-CM | POA: Insufficient documentation

## 2017-12-29 DIAGNOSIS — N183 Chronic kidney disease, stage 3 (moderate): Secondary | ICD-10-CM | POA: Insufficient documentation

## 2017-12-29 DIAGNOSIS — Y929 Unspecified place or not applicable: Secondary | ICD-10-CM | POA: Diagnosis not present

## 2017-12-29 DIAGNOSIS — S0990XA Unspecified injury of head, initial encounter: Secondary | ICD-10-CM | POA: Diagnosis not present

## 2017-12-29 DIAGNOSIS — M545 Low back pain: Secondary | ICD-10-CM | POA: Diagnosis not present

## 2017-12-29 DIAGNOSIS — J449 Chronic obstructive pulmonary disease, unspecified: Secondary | ICD-10-CM | POA: Insufficient documentation

## 2017-12-29 DIAGNOSIS — Z79899 Other long term (current) drug therapy: Secondary | ICD-10-CM | POA: Diagnosis not present

## 2017-12-29 DIAGNOSIS — S098XXA Other specified injuries of head, initial encounter: Secondary | ICD-10-CM | POA: Diagnosis not present

## 2017-12-29 DIAGNOSIS — I130 Hypertensive heart and chronic kidney disease with heart failure and stage 1 through stage 4 chronic kidney disease, or unspecified chronic kidney disease: Secondary | ICD-10-CM | POA: Diagnosis not present

## 2017-12-29 DIAGNOSIS — S3992XA Unspecified injury of lower back, initial encounter: Secondary | ICD-10-CM | POA: Diagnosis not present

## 2017-12-29 DIAGNOSIS — E1165 Type 2 diabetes mellitus with hyperglycemia: Secondary | ICD-10-CM | POA: Diagnosis not present

## 2017-12-29 DIAGNOSIS — Z950 Presence of cardiac pacemaker: Secondary | ICD-10-CM | POA: Insufficient documentation

## 2017-12-29 DIAGNOSIS — Y999 Unspecified external cause status: Secondary | ICD-10-CM | POA: Diagnosis not present

## 2017-12-29 DIAGNOSIS — W1812XA Fall from or off toilet with subsequent striking against object, initial encounter: Secondary | ICD-10-CM | POA: Insufficient documentation

## 2017-12-29 DIAGNOSIS — Y939 Activity, unspecified: Secondary | ICD-10-CM | POA: Insufficient documentation

## 2017-12-29 DIAGNOSIS — R52 Pain, unspecified: Secondary | ICD-10-CM | POA: Diagnosis not present

## 2017-12-29 DIAGNOSIS — I480 Paroxysmal atrial fibrillation: Secondary | ICD-10-CM | POA: Insufficient documentation

## 2017-12-29 DIAGNOSIS — S199XXA Unspecified injury of neck, initial encounter: Secondary | ICD-10-CM | POA: Diagnosis not present

## 2017-12-29 NOTE — ED Triage Notes (Signed)
EMS reports pt fell getting off the commode at home.  Says both knees "gave out" and she fell landing on buttocks then fell backwards and hit head.  Denies any LOC.  C/O tailbone pain.  Pt had dried blood draining from left ear but says has had an ear infection and was going to the doctor today.  PT also being treated for UTI .

## 2017-12-29 NOTE — Discharge Instructions (Addendum)
Take Tylenol for pain and follow-up as planned with your family doctor this week

## 2017-12-29 NOTE — ED Provider Notes (Signed)
[facility] EMERGENCY DEPARTMENT Provider Note   CSN: [id number] Arrival date & time: 12/29/17  1436     History   Chief Complaint Chief Complaint  Patient presents with  . Fall    HPI Kathleen [name] is a 84 y.o. female.  Patient states that she fell off the commode and hit her buttocks and then hit her head no loss of consciousness  The history is provided by the patient. No language interpreter was used.  Fall  This is a new problem. The current episode started 3 to 5 hours ago. The problem occurs rarely. The problem has been resolved. Pertinent negatives include no chest pain, no abdominal pain and no headaches. Nothing aggravates the symptoms. Nothing relieves the symptoms. The treatment provided no relief.    Past Medical History:  Diagnosis Date  . Acid reflux   . AV block, 2nd degree    a. s/p Medtronic PPM placement in 2013 with Bi-V upgrade and CRT  . CAD (coronary artery disease)   . Chronic kidney disease (CKD), stage III (moderate) (HCC)   . Essential hypertension   . History of pneumonia   . Hyperlipidemia   . Macular degeneration   . Nonischemic cardiomyopathy (HCC)    a. EF 40-45% by echo in 2017 b. reduced to 25-30% by repeat echo in 03/2017  . PAF (paroxysmal atrial fibrillation) (HCC)    a. on Eliquis and Amiodarone  . Type 2 diabetes mellitus [facility])     Patient Active Problem List   Diagnosis Date Noted  . Acute metabolic encephalopathy 12/23/2017  . Right upper lobe pneumonia (HCC) 12/23/2017  . Chronic respiratory failure with hypoxia (HCC) 12/23/2017  . Atrial fibrillation, chronic 04/13/2017  . Failure to thrive in adult 04/13/2017  . CHF (congestive heart failure) (HCC) 04/11/2017  . Acute on chronic combined systolic (congestive) and diastolic (congestive) heart failure (HCC) 08/21/2016  . Acute combined systolic and diastolic CHF, NYHA class 1 (HCC) 08/21/2016  . Persistent atrial fibrillation   . Encounter for monitoring amiodarone  therapy 06/08/2016  . Right hip pain   . Pressure injury of skin 11/19/2015  . Hematoma of hip, right, initial encounter 11/18/2015  . Abdominal distension   . Malnutrition of moderate degree 11/02/2015  . Closed displaced intertrochanteric fracture of right femur (HCC)   . Hip fracture (HCC) 11/01/2015  . Acute on chronic combined systolic and diastolic CHF, NYHA class 1 (HCC) 05/02/2015  . Pain in the chest   . Chest pain 04/30/2015  . Pelvis fracture, closed, initial encounter 02/19/2015  . Distal radial epiphysitis 02/19/2015  . Hypercortisolemia (HCC) 10/03/2014  . UTI (urinary tract infection) 10/05/2013  . Atrial fibrillation with RVR (HCC) 10/04/2013  . Generalized weakness 10/04/2013  . Chronic combined systolic and diastolic CHF (congestive heart failure) (HCC) 10/20/2012  . CKD (chronic kidney disease) stage 3, GFR 30-59 ml/min (HCC) 10/20/2012  . Cardiomyopathy, dilated, nonischemic 10/14/2011  . CRT - pacemaker Medtronic 10/14/2011  . Hypokalemia 10/12/2011  . Acute renal insufficiency, Scr was WNL in April 2013 10/11/2011  . Anemia 10/11/2011  . Unstable angina (HCC) 10/11/2011  . Gait abnormality 05/13/2011  . Syncope 03/25/2011  . COPD on CXR 03/25/2011  . Coronary atherosclerosis, no significant obstructive lesions 03/25/2011  . Symptomatic advanced heart block, 2:1 AV block, RT BBB, Lt post. fas. block.(March 2013) 03/23/2011  . Paroxysmal atrial fibrillation (HCC) 03/23/2011  . Lung nodule 12/13/2010  . DM type 2 (diabetes mellitus, type 2) (HCC) 12/13/2010  .  Hyperlipidemia 12/13/2010  . TOTAL KNEE FOLLOW-UP 06/20/2009  . MONONEURITIS, LEG 02/19/2009  . KNEE, ARTHRITIS, DEGEN./OSTEO 11/30/2008  . SPINAL STENOSIS 07/04/2008  . HEMARTHROSIS, LOWER LEG 05/10/2008  . Effusion of lower leg joint 05/03/2008  . KNEE PAIN 05/03/2008  . Hypertension 05/03/2008    Past Surgical History:  Procedure Laterality Date  . Bladder tack  1970's  . CARDIAC  CATHETERIZATION  10/15/2011   Normal coronaries  . CARDIOVERSION N/A 10/28/2013   Procedure: CARDIOVERSION;  Surgeon: Thurmon Fair, MD;  Location: [facility] ENDOSCOPY;  Service: Cardiovascular;  Laterality: N/A;  . CARDIOVERSION N/A 08/20/2016   Procedure: CARDIOVERSION;  Surgeon: Thurmon Fair, MD;  Location: [facility] ENDOSCOPY;  Service: Cardiovascular;  Laterality: N/A;  . CHOLECYSTECTOMY  1999  . INTRAMEDULLARY (IM) NAIL INTERTROCHANTERIC Right 11/03/2015   Procedure: INTRAMEDULLARY (IM) NAIL RIGHT INTERTROCHANTERIC HIP FRACTURE;  Surgeon: Tarry Kos, MD;  Location: [facility] OR;  Service: Orthopedics;  Laterality: Right;  . LEFT AND RIGHT HEART CATHETERIZATION WITH CORONARY ANGIOGRAM N/A 10/15/2011   Procedure: LEFT AND RIGHT HEART CATHETERIZATION WITH CORONARY ANGIOGRAM;  Surgeon: Chrystie Nose, MD;  Location: [facility] CATH LAB;  Service: Cardiovascular;  Laterality: N/A;  . NM MYOCAR PERF WALL MOTION  03/15/2009   Normal  . PACEMAKER INSERTION  10/16/2011   Medtronic  . PERMANENT PACEMAKER INSERTION N/A 03/24/2011   Procedure: PERMANENT PACEMAKER INSERTION;  Surgeon: Thurmon Fair, MD;  Location: [facility] CATH LAB;  Service: Cardiovascular;  Laterality: N/A;  . REPLACEMENT TOTAL KNEE       OB History   No obstetric history on file.      Home Medications    Prior to Admission medications   Medication Sig Start Date End Date Taking? Authorizing Provider  acetaminophen (TYLENOL) 325 MG tablet Take 650 mg by mouth every 4 (four) hours as needed for mild pain or moderate pain.    Yes [provider]  amiodarone (PACERONE) 200 MG tablet Take 1 tablet (200 mg total) by mouth daily. 04/18/17  Yes Philip Aspen, Limmie Patricia, MD  cefdinir (OMNICEF) 300 MG capsule Take 1 capsule (300 mg total) by mouth daily. 12/24/17  Yes Tyrone Nine, MD  Coenzyme Q10 (CO Q 10 PO) Take 1 tablet by mouth every morning.    Yes [provider]  DM-Doxylamine-Acetaminophen (NYQUIL COLD & FLU PO) Take 1 capsule by  mouth at bedtime as needed (sleep).   Yes [provider]  doxycycline (VIBRA-TABS) 100 MG tablet Take 1 tablet (100 mg total) by mouth every 12 (twelve) hours. 12/24/17  Yes Tyrone Nine, MD  ELIQUIS 2.5 MG TABS tablet TAKE ONE TABLET BY MOUTH TWICE DAILY. Patient taking differently: Take 2.5 mg by mouth 2 (two) times daily.  10/26/17  Yes Croitoru, Mihai, MD  ENTRESTO 24-26 MG TAKE (1) TABLET BY MOUTH TWICE DAILY. Patient taking differently: Take 1 tablet by mouth 2 (two) times daily.  12/23/17  Yes Jonelle Sidle, MD  ferrous sulfate 325 (65 FE) MG tablet Take 325 mg by mouth daily with breakfast.    Yes [provider]  furosemide (LASIX) 20 MG tablet Take 3 tablets (60 mg total) by mouth daily. 04/18/17  Yes Philip Aspen, Limmie Patricia, MD  metolazone (ZAROXOLYN) 2.5 MG tablet Take 1 tablet by mouth once or twice a week as needed. Patient taking differently: Take 2.5 mg by mouth See admin instructions. Take 1 tablet by mouth once or twice a week as needed for fluid 04/27/17  Yes Croitoru, Rachelle Hora, MD  metoprolol succinate (TOPROL-XL) 25 MG 24 hr tablet Take 3 tablets (75 mg total) by mouth 2 (two) times daily. Patient taking differently: Take 25-50 mg by mouth 2 (two) times daily. 50mg  in the morning and 25mg  at bedtime 09/15/17  Yes Croitoru, Mihai, MD  Multiple Vitamins-Minerals (PRESERVISION AREDS 2+MULTI VIT) CAPS Take 1 capsule by mouth 2 (two) times daily.   Yes [provider]  nitroGLYCERIN (NITROSTAT) 0.4 MG SL tablet DISSOLVE 1 TABLET UNDER TONGUE EVERY 5 MINUTES UP TO 15 MIN FOR CHESTPAIN. IF NO RELIEF CALL 911. 12/25/14  Yes Croitoru, Mihai, MD  ondansetron (ZOFRAN) 4 MG tablet Take 4 mg by mouth every 4 (four) hours as needed for nausea or vomiting.   Yes [provider]  polyethylene glycol (MIRALAX / GLYCOLAX) packet Take 17 g by mouth daily as needed for mild constipation.    Yes [provider]  potassium chloride SA (K-DUR,KLOR-CON) 20  MEQ tablet Take 1 capsule (20 mEq) daily. Take an extra 1 capsule (20 mEq) on days you take an extra lasix. Patient taking differently: Take 20 mEq by mouth daily. ** Take an extra 1 capsule (20 mEq) on days you take an extra lasix. 08/22/16  Yes Duke, Roe Rutherford, PA  benazepril (LOTENSIN) 40 MG tablet Take 40 mg by mouth 2 (two) times daily.  05/27/17  [provider]    Family History Family History  Problem Relation Age of Onset  . Suicidality Mother   . Heart murmur Daughter   . Heart attack Brother     Social History Social History   Tobacco Use  . Smoking status: Never Smoker  . Smokeless tobacco: Never Used  Substance Use Topics  . Alcohol use: No  . Drug use: No     Allergies   Coumadin [warfarin sodium]; Meloxicam; Metformin and related; Morphine; Nabumetone; Oxycodone; and Sulfonamide derivatives   Review of Systems Review of Systems  Constitutional: Negative for appetite change and fatigue.  HENT: Negative for congestion, ear discharge and sinus pressure.   Eyes: Negative for discharge.  Respiratory: Negative for cough.   Cardiovascular: Negative for chest pain.  Gastrointestinal: Negative for abdominal pain and diarrhea.  Genitourinary: Negative for frequency and hematuria.  Musculoskeletal: Negative for back pain.       Mild lower lumbar pain  Skin: Negative for rash.  Neurological: Negative for seizures and headaches.  Psychiatric/Behavioral: Negative for hallucinations.     Physical Exam Updated Vital Signs BP 122/62   Pulse 71   Temp 98.2 F (36.8 C) (Oral)   Resp 17   Ht 5\' 4"  (1.626 m)   Wt 51.6 kg   SpO2 100%   BMI 19.53 kg/m   Physical Exam Constitutional:      Appearance: She is well-developed.  HENT:     Head: Normocephalic.     Ears:     Comments: Small amount of bleeding in the left ear canal Eyes:     General: No scleral icterus.    Conjunctiva/sclera: Conjunctivae normal.  Neck:     Musculoskeletal: Neck supple.      Thyroid: No thyromegaly.  Cardiovascular:     Rate and Rhythm: Normal rate and regular rhythm.     Heart sounds: No murmur. No friction rub. No gallop.   Pulmonary:     Breath sounds: No stridor. No wheezing or rales.  Chest:     Chest wall: No tenderness.  Abdominal:     General: There is no distension.  Tenderness: There is no abdominal tenderness. There is no rebound.  Musculoskeletal: Normal range of motion.     Comments: Mild lumbar spine tenderness  Lymphadenopathy:     Cervical: No cervical adenopathy.  Skin:    Findings: No erythema or rash.  Neurological:     Mental Status: She is oriented to person, place, and time.     Motor: No abnormal muscle tone.     Coordination: Coordination normal.  Psychiatric:        Behavior: Behavior normal.      ED Treatments / Results  Labs (all labs ordered are listed, but only abnormal results are displayed) Labs Reviewed - No data to display  EKG None  Radiology Dg Lumbar Spine Complete  Result Date: 12/29/2017 CLINICAL DATA:  Status post fall EXAM: LUMBAR SPINE - COMPLETE 4+ VIEW COMPARISON:  None. FINDINGS: There is no evidence of lumbar spine fracture. Alignment is normal. Generalized osteopenia. Degenerative disc disease with disc height loss at L3-4, L4-5, and L5-S1. Bilateral facet arthropathy throughout the lumbar spine. Mild osteoarthritis of bilateral SI joints. IMPRESSION: Lumbar spine spondylosis as described above. Electronically Signed   By: Elige Ko   On: 12/29/2017 15:50   Ct Head Wo Contrast  Result Date: 12/29/2017 CLINICAL DATA:  84 year old female fell getting off commode. Both knees gave out. Fell backwards and hit head. Denies loss of consciousness. Dried blood left ear. Patient states she has infection and was going to physician today. Initial encounter. EXAM: CT HEAD WITHOUT CONTRAST CT CERVICAL SPINE WITHOUT CONTRAST TECHNIQUE: Multidetector CT imaging of the head and cervical spine was  performed following the standard protocol without intravenous contrast. Multiplanar CT image reconstructions of the cervical spine were also generated. COMPARISON:  01/31/2015 head CT. 03/15/2011 head CT and cervical spine CT. 10/09/2017 chest CT. FINDINGS: CT HEAD FINDINGS Brain: No intracranial hemorrhage or CT evidence of large acute infarct. Chronic microvascular changes. Global atrophy. No intracranial mass lesion noted on this unenhanced exam. Vascular: Vascular calcifications.  No acute hyperdense vessel. Skull: No skull fracture noted. Sinuses/Orbits: Post lens replacement. No acute orbital abnormality. Visualized paranasal sinuses are clear. Other: Partial opacification inferior aspect left mastoid air cells. No surrounding fracture noted. Left temporal mandibular joint degenerative changes. CT CERVICAL SPINE FINDINGS Alignment: Minimal anterior slip C6 and C7 similar to prior cervical spine CT. Mild anterior wedge compression deformity T4 with minimal anterior slip T1, T2 and T3 similar to prior chest CT. Skull base and vertebrae: No cervical spine fracture noted. Soft tissues and spinal canal: No abnormal prevertebral soft tissue swelling. Disc levels: Mild transverse ligament hypertrophy with slight narrowing ventral thecal sac. Cervical spondylotic changes similar to prior exam. Upper chest: Diffuse chronic lung changes lung apices. Some of these were noted on recent chest CT. At such time, a three-month follow-up chest CT was recommended. Other: Circumferential thickening of the passageway of the left external auditory canal possibly with small amount of fluid. Findings may be related to infection/inflammation given patient's history of recent ear infection however hemorrhage is also consideration. No adjacent fracture identified. IMPRESSION: CT HEAD: 1. No skull fracture or intracranial hemorrhage. 2. Partial opacification left mastoid air cells without surrounding fracture noted. 3. Chronic  microvascular changes. Global atrophy. CT CERVICAL SPINE: 1. Minimal anterior slip C6 and C7 similar to prior cervical spine CT. Mild anterior wedge compression deformity T4 with minimal anterior slip T1, T2 and T3 similar to prior chest CT. 2. No cervical spine fracture or abnormal prevertebral soft tissue  swelling. 3. Circumferential thickening of the passageway of the left external auditory canal possibly with small amount of fluid. Findings may be related to infection/inflammation given patient's history of recent ear infection however hemorrhage is also consideration. No adjacent fracture identified. 4. Diffuse chronic lung changes lung apices. Some of these were noted on recent chest CT. At such time, a three-month follow-up chest CT was recommended (can be performed without contrast). Electronically Signed   By: Lacy Duverney M.D.   On: 12/29/2017 16:35   Ct Cervical Spine Wo Contrast  Result Date: 12/29/2017 CLINICAL DATA:  84 year old female fell getting off commode. Both knees gave out. Fell backwards and hit head. Denies loss of consciousness. Dried blood left ear. Patient states she has infection and was going to physician today. Initial encounter. EXAM: CT HEAD WITHOUT CONTRAST CT CERVICAL SPINE WITHOUT CONTRAST TECHNIQUE: Multidetector CT imaging of the head and cervical spine was performed following the standard protocol without intravenous contrast. Multiplanar CT image reconstructions of the cervical spine were also generated. COMPARISON:  01/31/2015 head CT. 03/15/2011 head CT and cervical spine CT. 10/09/2017 chest CT. FINDINGS: CT HEAD FINDINGS Brain: No intracranial hemorrhage or CT evidence of large acute infarct. Chronic microvascular changes. Global atrophy. No intracranial mass lesion noted on this unenhanced exam. Vascular: Vascular calcifications.  No acute hyperdense vessel. Skull: No skull fracture noted. Sinuses/Orbits: Post lens replacement. No acute orbital abnormality.  Visualized paranasal sinuses are clear. Other: Partial opacification inferior aspect left mastoid air cells. No surrounding fracture noted. Left temporal mandibular joint degenerative changes. CT CERVICAL SPINE FINDINGS Alignment: Minimal anterior slip C6 and C7 similar to prior cervical spine CT. Mild anterior wedge compression deformity T4 with minimal anterior slip T1, T2 and T3 similar to prior chest CT. Skull base and vertebrae: No cervical spine fracture noted. Soft tissues and spinal canal: No abnormal prevertebral soft tissue swelling. Disc levels: Mild transverse ligament hypertrophy with slight narrowing ventral thecal sac. Cervical spondylotic changes similar to prior exam. Upper chest: Diffuse chronic lung changes lung apices. Some of these were noted on recent chest CT. At such time, a three-month follow-up chest CT was recommended. Other: Circumferential thickening of the passageway of the left external auditory canal possibly with small amount of fluid. Findings may be related to infection/inflammation given patient's history of recent ear infection however hemorrhage is also consideration. No adjacent fracture identified. IMPRESSION: CT HEAD: 1. No skull fracture or intracranial hemorrhage. 2. Partial opacification left mastoid air cells without surrounding fracture noted. 3. Chronic microvascular changes. Global atrophy. CT CERVICAL SPINE: 1. Minimal anterior slip C6 and C7 similar to prior cervical spine CT. Mild anterior wedge compression deformity T4 with minimal anterior slip T1, T2 and T3 similar to prior chest CT. 2. No cervical spine fracture or abnormal prevertebral soft tissue swelling. 3. Circumferential thickening of the passageway of the left external auditory canal possibly with small amount of fluid. Findings may be related to infection/inflammation given patient's history of recent ear infection however hemorrhage is also consideration. No adjacent fracture identified. 4. Diffuse  chronic lung changes lung apices. Some of these were noted on recent chest CT. At such time, a three-month follow-up chest CT was recommended (can be performed without contrast). Electronically Signed   By: Lacy Duverney M.D.   On: 12/29/2017 16:35    Procedures Procedures (including critical care time)  Medications Ordered in ED Medications - No data to display   Initial Impression / Assessment and Plan / ED Course  I  have reviewed the triage vital signs and the nursing notes.  Pertinent labs & imaging results that were available during my care of the patient were reviewed by me and considered in my medical decision making (see chart for details).     CT head and cervical spine showed no acute disease.  Plain films of the back did not show any compression fractures.  Patient with contusion to head and lumbar spine with some bleeding in her left ear canal.  She will follow-up with her PCP Final Clinical Impressions(s) / ED Diagnoses   Final diagnoses:  Fall, initial encounter    ED Discharge Orders    None       Bethann Berkshire, MD 12/29/17 1705

## 2017-12-31 ENCOUNTER — Telehealth: Payer: Self-pay

## 2017-12-31 ENCOUNTER — Ambulatory Visit (INDEPENDENT_AMBULATORY_CARE_PROVIDER_SITE_OTHER): Payer: PPO

## 2017-12-31 DIAGNOSIS — S0990XA Unspecified injury of head, initial encounter: Secondary | ICD-10-CM | POA: Diagnosis not present

## 2017-12-31 DIAGNOSIS — I10 Essential (primary) hypertension: Secondary | ICD-10-CM | POA: Diagnosis not present

## 2017-12-31 DIAGNOSIS — Z681 Body mass index (BMI) 19 or less, adult: Secondary | ICD-10-CM | POA: Diagnosis not present

## 2017-12-31 DIAGNOSIS — Z45018 Encounter for adjustment and management of other part of cardiac pacemaker: Secondary | ICD-10-CM

## 2017-12-31 DIAGNOSIS — H6092 Unspecified otitis externa, left ear: Secondary | ICD-10-CM | POA: Diagnosis not present

## 2017-12-31 DIAGNOSIS — I5022 Chronic systolic (congestive) heart failure: Secondary | ICD-10-CM

## 2017-12-31 DIAGNOSIS — I4891 Unspecified atrial fibrillation: Secondary | ICD-10-CM | POA: Diagnosis not present

## 2017-12-31 DIAGNOSIS — Z23 Encounter for immunization: Secondary | ICD-10-CM | POA: Diagnosis not present

## 2017-12-31 DIAGNOSIS — E1129 Type 2 diabetes mellitus with other diabetic kidney complication: Secondary | ICD-10-CM | POA: Diagnosis not present

## 2017-12-31 DIAGNOSIS — M549 Dorsalgia, unspecified: Secondary | ICD-10-CM | POA: Diagnosis not present

## 2017-12-31 NOTE — Telephone Encounter (Signed)
Confirmed remote transmission w/ pt sister.   

## 2018-01-01 NOTE — Progress Notes (Signed)
EPIC Encounter for ICM Monitoring  Patient Name: Kathleen [name] is a 84 y.o. female Date: 01/01/2018 Primary Care Physican: Redmond School, MD Primary Cardiologist:McDowell Electrophysiologist: Santina Evans Pacing: 100% Last Weight:103 lbs                        Transmission reviewed.  ER visit x 2 this month.    Thoracic impedance returned to baseline.   Prescribed: Furosemide20Take3 tablets (60 mg total)daily. Potassium 20 mEqTake 1 capsule (20 mEq) daily. Take an extra 1 capsule (20 mEq) on days you take an extra lasix. Metolazone 2.5 mg 1 tablet twice a week as needed. She is also taking extra Potassium when taking Metolazone.  Labs: 12/24/2017 Creatinine 1.22, BUN 31, Potassium 3.9, Sodium 129, eGFR 39-45 12/23/2017 Creatinine 1.12, BUN 30, Potassium 4.4, Sodium 125, eGFR 43-50 10/09/2017 Creatinine 1.28, BUN 28, Potassium 3.6, Sodium 135, eGFR 36-41 05/27/2017 Creatinine 1.11, BUN 35, Potassium 3.8, Sodium 132, EGFR 42-49 04/17/2017 Creatinine1.00, BUN41, Potassium4.4, Sodium132, [id number] 04/16/2017 Creatinine1.11, BUN46, Potassium4.5, Sodium129, [id number]  04/15/2017 Creatinine1.39, BUN40, Potassium4.6, [id number], [id number]  04/14/2017 Creatinine1.28, BUN40, Potassium4.9, [id number], [id number]  04/13/2017 Creatinine1.24, BUN35, Potassium4.9, [id number], E [id number]  04/12/2017 Creatinine1.32, BUN35, Potassium4.2, Sodium129, [id number]  04/11/2017 Creatinine1.42, BUN40, Potassium4.5, [id number], [id number]  03/24/2017 Creatinine1.42, BUN26, Potassium3.8, [id number], [id number]  Recommendations: None  Follow-up plan: ICM clinic phone appointment on 01/18/2018.    Copy of ICM check sent to Dr. Lovena Le.   3 month ICM trend: 12/31/2017    1 Year ICM trend:       [name], RN 01/01/2018 4:30 PM

## 2018-01-11 DIAGNOSIS — N390 Urinary tract infection, site not specified: Secondary | ICD-10-CM | POA: Diagnosis not present

## 2018-01-11 DIAGNOSIS — J189 Pneumonia, unspecified organism: Secondary | ICD-10-CM | POA: Diagnosis not present

## 2018-01-11 DIAGNOSIS — I872 Venous insufficiency (chronic) (peripheral): Secondary | ICD-10-CM | POA: Diagnosis not present

## 2018-01-11 DIAGNOSIS — E781 Pure hyperglyceridemia: Secondary | ICD-10-CM | POA: Diagnosis not present

## 2018-01-19 NOTE — Progress Notes (Signed)
No ICM remote transmission received for 01/18/2018 and next ICM transmission scheduled for 02/11/2018.   

## 2018-01-20 DIAGNOSIS — I5040 Unspecified combined systolic (congestive) and diastolic (congestive) heart failure: Secondary | ICD-10-CM | POA: Diagnosis not present

## 2018-01-20 DIAGNOSIS — R269 Unspecified abnormalities of gait and mobility: Secondary | ICD-10-CM | POA: Diagnosis not present

## 2018-01-20 DIAGNOSIS — I5042 Chronic combined systolic (congestive) and diastolic (congestive) heart failure: Secondary | ICD-10-CM | POA: Diagnosis not present

## 2018-01-20 DIAGNOSIS — Z96659 Presence of unspecified artificial knee joint: Secondary | ICD-10-CM | POA: Diagnosis not present

## 2018-01-20 DIAGNOSIS — J449 Chronic obstructive pulmonary disease, unspecified: Secondary | ICD-10-CM | POA: Diagnosis not present

## 2018-02-15 NOTE — Progress Notes (Signed)
No ICM remote transmission received for 02/11/2018 and next ICM transmission scheduled for 02/22/2018.   

## 2018-02-17 ENCOUNTER — Telehealth: Payer: Self-pay

## 2018-02-17 NOTE — Telephone Encounter (Signed)
Received call from daughter, [name].  She reported patient coughed up a little blood in her phlegm today. She had a cough this morning and she gave her Robitussin.  She said BP was higher than normal this morning, 136/77.  Patient's BP normal is usually 116-112/66.  She held all of patient's medications this morning because she was worried about seeing a little blood in the mucus she coughed up.  She is not having other symptoms and denies bloody stools.   Advised her that if patient's condition becomes worse then to call back or use ER if needed.  Advised the cough could have irritated her throat causing a little bit of bleeding.  Advised to monitor BP this evening and tomorrow and if is higher than 140/90 to call Dr McDowell's office.  Advised she should be able to resume patient's meds but continue to monitor for increase in blood in phlegm or has any bloody stools.  She stated she would do so.  Explained if she is still anxious about the condition tomorrow to call Dr McDowell's office.

## 2018-02-20 DIAGNOSIS — J449 Chronic obstructive pulmonary disease, unspecified: Secondary | ICD-10-CM | POA: Diagnosis not present

## 2018-02-20 DIAGNOSIS — R269 Unspecified abnormalities of gait and mobility: Secondary | ICD-10-CM | POA: Diagnosis not present

## 2018-02-20 DIAGNOSIS — Z96659 Presence of unspecified artificial knee joint: Secondary | ICD-10-CM | POA: Diagnosis not present

## 2018-02-20 DIAGNOSIS — I5042 Chronic combined systolic (congestive) and diastolic (congestive) heart failure: Secondary | ICD-10-CM | POA: Diagnosis not present

## 2018-02-20 DIAGNOSIS — I5040 Unspecified combined systolic (congestive) and diastolic (congestive) heart failure: Secondary | ICD-10-CM | POA: Diagnosis not present

## 2018-02-24 ENCOUNTER — Other Ambulatory Visit: Payer: Self-pay | Admitting: Cardiovascular Disease

## 2018-02-26 ENCOUNTER — Telehealth: Payer: Self-pay

## 2018-02-26 NOTE — Telephone Encounter (Signed)
Left message for patient to remind of missed remote transmission.  

## 2018-03-03 ENCOUNTER — Other Ambulatory Visit (HOSPITAL_COMMUNITY): Payer: PPO

## 2018-03-15 ENCOUNTER — Telehealth: Payer: Self-pay

## 2018-03-15 ENCOUNTER — Ambulatory Visit: Payer: PPO | Admitting: Cardiology

## 2018-03-15 NOTE — Progress Notes (Deleted)
Cardiology Office Note  Date: 03/15/2018   ID: Kathleen [name], DOB 13-Sep-1924, MRN [id number]  PCP: Redmond School, MD  Primary Cardiologist: Rozann Lesches, MD   No chief complaint on file.   History of Present Illness: Kathleen [name] is a 85 y.o. female that I met in November 2019, a former patient of Dr. Sallyanne Kuster.  She is established with Dr. Lovena Le in the device clinic, Medtronic CRT-P in place.  Reviewed her lab work from December 2019.  There has been a downward trend in hemoglobin over the last year from 13.1-8.1.  Past Medical History:  Diagnosis Date  . Acid reflux   . AV block, 2nd degree    a. s/p Medtronic PPM placement in 2013 with Bi-V upgrade and CRT  . CAD (coronary artery disease)   . Chronic kidney disease (CKD), stage III (moderate) (HCC)   . Essential hypertension   . History of pneumonia   . Hyperlipidemia   . Macular degeneration   . Nonischemic cardiomyopathy ([place])    a. EF 40-45% by echo in 2017 b. reduced to 25-30% by repeat echo in 03/2017  . PAF (paroxysmal atrial fibrillation) (HCC)    a. on Eliquis and Amiodarone  . Type 2 diabetes mellitus ([place])     Past Surgical History:  Procedure Laterality Date  . Bladder tack  1970's  . CARDIAC CATHETERIZATION  10/15/2011   Normal coronaries  . CARDIOVERSION N/A 10/28/2013   Procedure: CARDIOVERSION;  Surgeon: Sanda Klein, MD;  Location: [facility];  Service: Cardiovascular;  Laterality: N/A;  . CARDIOVERSION N/A 08/20/2016   Procedure: CARDIOVERSION;  Surgeon: Sanda Klein, MD;  Location: [facility];  Service: Cardiovascular;  Laterality: N/A;  . CHOLECYSTECTOMY  1999  . INTRAMEDULLARY (IM) NAIL INTERTROCHANTERIC Right 11/03/2015   Procedure: INTRAMEDULLARY (IM) NAIL RIGHT INTERTROCHANTERIC HIP FRACTURE;  Surgeon: Leandrew Koyanagi, MD;  Location: [facility];  Service: Orthopedics;  Laterality: Right;  . LEFT AND RIGHT HEART CATHETERIZATION WITH CORONARY ANGIOGRAM N/A 10/15/2011   Procedure:  LEFT AND RIGHT HEART CATHETERIZATION WITH CORONARY ANGIOGRAM;  Surgeon: Pixie Casino, MD;  Location: [facility];  Service: Cardiovascular;  Laterality: N/A;  . NM MYOCAR PERF WALL MOTION  03/15/2009   Normal  . PACEMAKER INSERTION  10/16/2011   Medtronic  . PERMANENT PACEMAKER INSERTION N/A 03/24/2011   Procedure: PERMANENT PACEMAKER INSERTION;  Surgeon: Sanda Klein, MD;  Location: [facility] CATH LAB;  Service: Cardiovascular;  Laterality: N/A;  . REPLACEMENT TOTAL KNEE      Current Outpatient Medications  Medication Sig Dispense Refill  . acetaminophen (TYLENOL) 325 MG tablet Take 650 mg by mouth every 4 (four) hours as needed for mild pain or moderate pain.     Marland Kitchen amiodarone (PACERONE) 200 MG tablet Take 1 tablet (200 mg total) by mouth daily. 30 tablet 2  . cefdinir (OMNICEF) 300 MG capsule Take 1 capsule (300 mg total) by mouth daily. 9 capsule 0  . Coenzyme Q10 (CO Q 10 PO) Take 1 tablet by mouth every morning.     Marland Kitchen DM-Doxylamine-Acetaminophen (NYQUIL COLD & FLU PO) Take 1 capsule by mouth at bedtime as needed (sleep).    Marland Kitchen doxycycline (VIBRA-TABS) 100 MG tablet Take 1 tablet (100 mg total) by mouth every 12 (twelve) hours. 12 tablet 0  . ELIQUIS 2.5 MG TABS tablet TAKE ONE TABLET BY MOUTH TWICE DAILY. 180 tablet 0  . ENTRESTO 24-26 MG TAKE (1) TABLET BY MOUTH TWICE DAILY. (Patient taking differently: Take 1 tablet  by mouth 2 (two) times daily. ) 60 tablet 6  . ferrous sulfate 325 (65 FE) MG tablet Take 325 mg by mouth daily with breakfast.     . furosemide (LASIX) 20 MG tablet Take 3 tablets (60 mg total) by mouth daily. 90 tablet 2  . metolazone (ZAROXOLYN) 2.5 MG tablet Take 1 tablet by mouth once or twice a week as needed. (Patient taking differently: Take 2.5 mg by mouth See admin instructions. Take 1 tablet by mouth once or twice a week as needed for fluid) 24 tablet 3  . metoprolol succinate (TOPROL-XL) 25 MG 24 hr tablet Take 3 tablets (75 mg total) by mouth 2 (two) times daily.  (Patient taking differently: Take 25-50 mg by mouth 2 (two) times daily. '50mg'$  in the morning and '25mg'$  at bedtime) 180 tablet 3  . Multiple Vitamins-Minerals (PRESERVISION AREDS 2+MULTI VIT) CAPS Take 1 capsule by mouth 2 (two) times daily.    . nitroGLYCERIN (NITROSTAT) 0.4 MG SL tablet DISSOLVE 1 TABLET UNDER TONGUE EVERY 5 MINUTES UP TO 15 MIN FOR CHESTPAIN. IF NO RELIEF CALL 911. 25 tablet 4  . ondansetron (ZOFRAN) 4 MG tablet Take 4 mg by mouth every 4 (four) hours as needed for nausea or vomiting.    . polyethylene glycol (MIRALAX / GLYCOLAX) packet Take 17 g by mouth daily as needed for mild constipation.     . potassium chloride SA (K-DUR,KLOR-CON) 20 MEQ tablet Take 1 capsule (20 mEq) daily. Take an extra 1 capsule (20 mEq) on days you take an extra lasix. (Patient taking differently: Take 20 mEq by mouth daily. ** Take an extra 1 capsule (20 mEq) on days you take an extra lasix.) 45 tablet 3   No current facility-administered medications for this visit.    Allergies:  Coumadin [warfarin sodium]; Meloxicam; Metformin and related; Morphine; Nabumetone; Oxycodone; and Sulfonamide derivatives   Social History: The patient  reports that she has never smoked. She has never used smokeless tobacco. She reports that she does not drink alcohol or use drugs.   Family History: The patient's family history includes Heart attack in her brother; Heart murmur in her daughter; Suicidality in her mother.   ROS:  Please see the history of present illness. Otherwise, complete review of systems is positive for {NONE DEFAULTED:18576::"none"}.  All other systems are reviewed and negative.   Physical Exam: VS:  There were no vitals taken for this visit., BMI There is no height or weight on file to calculate BMI.  Wt Readings from Last 3 Encounters:  12/29/17 113 lb 12.1 oz (51.6 kg)  12/23/17 113 lb 12.1 oz (51.6 kg)  12/17/17 112 lb (50.8 kg)    General: Patient appears comfortable at rest. HEENT:  Conjunctiva and lids normal, oropharynx clear with moist mucosa. Neck: Supple, no elevated JVP or carotid bruits, no thyromegaly. Lungs: Clear to auscultation, nonlabored breathing at rest. Cardiac: Regular rate and rhythm, no S3 or significant systolic murmur, no pericardial rub. Abdomen: Soft, nontender, no hepatomegaly, bowel sounds present, no guarding or rebound. Extremities: No pitting edema, distal pulses 2+. Skin: Warm and dry. Musculoskeletal: No kyphosis. Neuropsychiatric: Alert and oriented x3, affect grossly appropriate.  ECG: I personally reviewed the tracing from 12/23/2017 which showed dual-chamber pacing.  Recent Labwork: 10/09/2017: B Natriuretic Peptide 767.0 12/23/2017: ALT 34; AST 35; TSH 3.719 12/24/2017: BUN 31; Creatinine, Ser 1.22; Hemoglobin 8.1; Platelets 326; Potassium 3.9; Sodium 129     Component Value Date/Time   CHOL 151 04/11/2014 0820  TRIG 108 04/11/2014 0820   HDL 60 04/11/2014 0820   CHOLHDL 2.5 04/11/2014 0820   VLDL 22 04/11/2014 0820   LDLCALC 69 04/11/2014 0820    Other Studies Reviewed Today:  Echocardiogram 04/12/2017: Study Conclusions  - Left ventricle: The cavity size was mildly dilated. Systolic function was severely reduced. The estimated ejection fraction was in the range of 25% to 30%. Severe diffuse hypokinesis with distinct regional wall motion abnormalities. There is akinesis of the entireinferior myocardium. There is akinesis of the apicalanterior and apical myocardium. There was a reduced contribution of atrial contraction to ventricular filling, due to increased ventricular diastolic pressure or atrial contractile dysfunction. Doppler parameters are consistent with a reversible restrictive pattern, indicative of decreased left ventricular diastolic compliance and/or increased left atrial pressure (grade 3 diastolic dysfunction). Doppler parameters are consistent with high ventricular filling  pressure. - Mitral valve: Calcified annulus. Mildly thickened leaflets . There was mild regurgitation. - Left atrium: The atrium was mildly dilated. - Pulmonary arteries: PA peak pressure: 43 mm Hg (S). - Pericardium, extracardiac: A small, free-flowing pericardial effusion was identified along the right ventricular free wall and along the right atrial free wall. The fluid had no internal echoes.There was no evidence of hemodynamic compromise. There was a left pleural effusion.  Impressions:  - The right ventricular systolic pressure was increased consistent with moderate pulmonary hypertension.  Assessment and Plan:   Current medicines were reviewed with the patient today.  No orders of the defined types were placed in this encounter.   Disposition:  Signed, Satira Sark, MD, [facility] 03/15/2018 11:18 AM    [facility] [place] [place]. [address], [place], [place] [address] Phone: [phone]; Fax: [phone]

## 2018-03-15 NOTE — Telephone Encounter (Signed)
Call to daughter, Lourdes Sledge regarding remote transmission. She reported patient is now in hospice and she is under the care of hospice physicians.  Advised patient has an appt with Dr Diona Browner today and she said to cancel it.  Patient is so weak that she can't get out of bed.  She appreciated all the great care she has received and thanked [facility] physician and nurses.  Advised I would let Dr Orson Gear nurse know to cancel the appt today and advised if she needs anything in the future to call us.  As of now, I will disenroll her from [facility].  Advised when she is ready she can call the number on the monitor and they will instruct her how to return the monitor.  If she has any questions she can call me back if needed.

## 2018-03-16 ENCOUNTER — Encounter: Payer: Self-pay | Admitting: Cardiology

## 2018-03-18 ENCOUNTER — Encounter: Payer: PPO | Admitting: *Deleted

## 2018-03-19 ENCOUNTER — Telehealth: Payer: Self-pay

## 2018-03-19 NOTE — Telephone Encounter (Signed)
Left message for patient to remind of missed remote transmission.  

## 2018-03-24 ENCOUNTER — Other Ambulatory Visit: Payer: Self-pay | Admitting: Cardiovascular Disease

## 2018-04-14 DEATH — deceased

## 2019-04-08 ENCOUNTER — Telehealth: Payer: Self-pay

## 2019-04-11 NOTE — Telephone Encounter (Signed)
error
